# Patient Record
Sex: Female | Born: 1965 | Race: Black or African American | Hispanic: No | Smoking: Never smoker
Health system: Southern US, Community
[De-identification: ages and names within clinical notes are randomized; demographics above are authoritative.]

## PROBLEM LIST (undated history)

## (undated) DIAGNOSIS — M549 Dorsalgia, unspecified: Secondary | ICD-10-CM

## (undated) DIAGNOSIS — G4733 Obstructive sleep apnea (adult) (pediatric): Secondary | ICD-10-CM

## (undated) DIAGNOSIS — M069 Rheumatoid arthritis, unspecified: Secondary | ICD-10-CM

## (undated) DIAGNOSIS — E119 Type 2 diabetes mellitus without complications: Secondary | ICD-10-CM

## (undated) DIAGNOSIS — R011 Cardiac murmur, unspecified: Secondary | ICD-10-CM

## (undated) DIAGNOSIS — M810 Age-related osteoporosis without current pathological fracture: Secondary | ICD-10-CM

## (undated) DIAGNOSIS — F333 Major depressive disorder, recurrent, severe with psychotic symptoms: Secondary | ICD-10-CM

## (undated) DIAGNOSIS — R002 Palpitations: Secondary | ICD-10-CM

## (undated) DIAGNOSIS — R51 Headache: Secondary | ICD-10-CM

## (undated) DIAGNOSIS — G8929 Other chronic pain: Secondary | ICD-10-CM

## (undated) DIAGNOSIS — E559 Vitamin D deficiency, unspecified: Secondary | ICD-10-CM

## (undated) DIAGNOSIS — I10 Essential (primary) hypertension: Secondary | ICD-10-CM

## (undated) DIAGNOSIS — J3081 Allergic rhinitis due to animal (cat) (dog) hair and dander: Secondary | ICD-10-CM

## (undated) DIAGNOSIS — Z86718 Personal history of other venous thrombosis and embolism: Secondary | ICD-10-CM

## (undated) DIAGNOSIS — I2699 Other pulmonary embolism without acute cor pulmonale: Secondary | ICD-10-CM

## (undated) DIAGNOSIS — M2550 Pain in unspecified joint: Secondary | ICD-10-CM

## (undated) DIAGNOSIS — D649 Anemia, unspecified: Secondary | ICD-10-CM

## (undated) DIAGNOSIS — K5900 Constipation, unspecified: Secondary | ICD-10-CM

## (undated) DIAGNOSIS — Z91018 Allergy to other foods: Secondary | ICD-10-CM

## (undated) DIAGNOSIS — Z9181 History of falling: Secondary | ICD-10-CM

## (undated) DIAGNOSIS — T7840XA Allergy, unspecified, initial encounter: Secondary | ICD-10-CM

## (undated) DIAGNOSIS — R0600 Dyspnea, unspecified: Secondary | ICD-10-CM

## (undated) DIAGNOSIS — F410 Panic disorder [episodic paroxysmal anxiety] without agoraphobia: Secondary | ICD-10-CM

## (undated) DIAGNOSIS — R55 Syncope and collapse: Secondary | ICD-10-CM

## (undated) DIAGNOSIS — F32A Depression, unspecified: Secondary | ICD-10-CM

## (undated) DIAGNOSIS — Z9109 Other allergy status, other than to drugs and biological substances: Secondary | ICD-10-CM

## (undated) DIAGNOSIS — R42 Dizziness and giddiness: Secondary | ICD-10-CM

## (undated) DIAGNOSIS — M542 Cervicalgia: Secondary | ICD-10-CM

## (undated) DIAGNOSIS — E7800 Pure hypercholesterolemia, unspecified: Secondary | ICD-10-CM

## (undated) DIAGNOSIS — F319 Bipolar disorder, unspecified: Secondary | ICD-10-CM

## (undated) DIAGNOSIS — R5382 Chronic fatigue, unspecified: Secondary | ICD-10-CM

## (undated) DIAGNOSIS — R600 Localized edema: Secondary | ICD-10-CM

## (undated) DIAGNOSIS — G4730 Sleep apnea, unspecified: Secondary | ICD-10-CM

## (undated) DIAGNOSIS — M797 Fibromyalgia: Secondary | ICD-10-CM

## (undated) DIAGNOSIS — E739 Lactose intolerance, unspecified: Secondary | ICD-10-CM

## (undated) DIAGNOSIS — E079 Disorder of thyroid, unspecified: Secondary | ICD-10-CM

## (undated) DIAGNOSIS — J309 Allergic rhinitis, unspecified: Secondary | ICD-10-CM

## (undated) DIAGNOSIS — K219 Gastro-esophageal reflux disease without esophagitis: Secondary | ICD-10-CM

## (undated) DIAGNOSIS — F419 Anxiety disorder, unspecified: Secondary | ICD-10-CM

## (undated) DIAGNOSIS — G9332 Myalgic encephalomyelitis/chronic fatigue syndrome: Secondary | ICD-10-CM

## (undated) DIAGNOSIS — M5430 Sciatica, unspecified side: Secondary | ICD-10-CM

## (undated) DIAGNOSIS — E0500 Thyrotoxicosis with diffuse goiter without thyrotoxic crisis or storm: Secondary | ICD-10-CM

## (undated) DIAGNOSIS — F329 Major depressive disorder, single episode, unspecified: Secondary | ICD-10-CM

## (undated) HISTORY — DX: Disorder of thyroid, unspecified: E07.9

## (undated) HISTORY — DX: Cervicalgia: M54.2

## (undated) HISTORY — DX: Allergy, unspecified, initial encounter: T78.40XA

## (undated) HISTORY — DX: Obstructive sleep apnea (adult) (pediatric): G47.33

## (undated) HISTORY — DX: Other chronic pain: G89.29

## (undated) HISTORY — DX: Cardiac murmur, unspecified: R01.1

## (undated) HISTORY — DX: Myalgic encephalomyelitis/chronic fatigue syndrome: G93.32

## (undated) HISTORY — DX: Panic disorder (episodic paroxysmal anxiety): F41.0

## (undated) HISTORY — DX: Allergic rhinitis, unspecified: J30.9

## (undated) HISTORY — PX: COLONOSCOPY: SHX174

## (undated) HISTORY — DX: Rheumatoid arthritis, unspecified: M06.9

## (undated) HISTORY — DX: Age-related osteoporosis without current pathological fracture: M81.0

## (undated) HISTORY — DX: Sleep apnea, unspecified: G47.30

## (undated) HISTORY — DX: Constipation, unspecified: K59.00

## (undated) HISTORY — DX: Palpitations: R00.2

## (undated) HISTORY — DX: Pain in unspecified joint: M25.50

## (undated) HISTORY — DX: Localized edema: R60.0

## (undated) HISTORY — DX: Anxiety disorder, unspecified: F41.9

## (undated) HISTORY — DX: Vitamin D deficiency, unspecified: E55.9

## (undated) HISTORY — DX: Allergic rhinitis due to animal (cat) (dog) hair and dander: J30.81

## (undated) HISTORY — DX: Chronic fatigue, unspecified: R53.82

## (undated) HISTORY — DX: Anemia, unspecified: D64.9

## (undated) HISTORY — DX: Lactose intolerance, unspecified: E73.9

## (undated) HISTORY — DX: Allergy to other foods: Z91.018

## (undated) HISTORY — DX: History of falling: Z91.81

## (undated) HISTORY — DX: Major depressive disorder, recurrent, severe with psychotic symptoms: F33.3

## (undated) HISTORY — DX: Dyspnea, unspecified: R06.00

## (undated) HISTORY — DX: Type 2 diabetes mellitus without complications: E11.9

## (undated) HISTORY — DX: Headache: R51

## (undated) HISTORY — DX: Pure hypercholesterolemia, unspecified: E78.00

## (undated) HISTORY — DX: Dorsalgia, unspecified: M54.9

## (undated) HISTORY — DX: Essential (primary) hypertension: I10

## (undated) HISTORY — DX: Dizziness and giddiness: R42

## (undated) HISTORY — DX: Bipolar disorder, unspecified: F31.9

## (undated) HISTORY — DX: Personal history of other venous thrombosis and embolism: Z86.718

---

## 1997-11-11 ENCOUNTER — Emergency Department (HOSPITAL_COMMUNITY): Admission: EM | Admit: 1997-11-11 | Discharge: 1997-11-11 | Payer: Self-pay

## 1998-10-08 ENCOUNTER — Ambulatory Visit (HOSPITAL_COMMUNITY): Admission: RE | Admit: 1998-10-08 | Discharge: 1998-10-08 | Payer: Self-pay | Admitting: Internal Medicine

## 2002-09-27 ENCOUNTER — Other Ambulatory Visit: Admission: RE | Admit: 2002-09-27 | Discharge: 2002-09-27 | Payer: Self-pay | Admitting: Gynecology

## 2003-01-21 ENCOUNTER — Encounter: Admission: RE | Admit: 2003-01-21 | Discharge: 2003-01-21 | Payer: Self-pay | Admitting: Allergy and Immunology

## 2003-04-11 ENCOUNTER — Encounter: Admission: RE | Admit: 2003-04-11 | Discharge: 2003-04-11 | Payer: Self-pay | Admitting: Allergy and Immunology

## 2003-04-25 ENCOUNTER — Ambulatory Visit (HOSPITAL_COMMUNITY): Admission: RE | Admit: 2003-04-25 | Discharge: 2003-04-25 | Payer: Self-pay | Admitting: Allergy and Immunology

## 2003-05-28 ENCOUNTER — Ambulatory Visit (HOSPITAL_COMMUNITY): Admission: RE | Admit: 2003-05-28 | Discharge: 2003-05-28 | Payer: Self-pay | Admitting: Gastroenterology

## 2003-06-17 ENCOUNTER — Encounter: Admission: RE | Admit: 2003-06-17 | Discharge: 2003-06-17 | Payer: Self-pay | Admitting: Allergy and Immunology

## 2003-09-02 ENCOUNTER — Emergency Department (HOSPITAL_COMMUNITY): Admission: EM | Admit: 2003-09-02 | Discharge: 2003-09-03 | Payer: Self-pay | Admitting: Emergency Medicine

## 2004-02-11 ENCOUNTER — Other Ambulatory Visit: Admission: RE | Admit: 2004-02-11 | Discharge: 2004-02-11 | Payer: Self-pay | Admitting: Gynecology

## 2004-04-20 ENCOUNTER — Encounter (INDEPENDENT_AMBULATORY_CARE_PROVIDER_SITE_OTHER): Payer: Self-pay | Admitting: Specialist

## 2004-04-20 ENCOUNTER — Observation Stay (HOSPITAL_COMMUNITY): Admission: RE | Admit: 2004-04-20 | Discharge: 2004-04-21 | Payer: Self-pay | Admitting: Gynecology

## 2004-05-24 HISTORY — PX: ABDOMINAL HYSTERECTOMY: SHX81

## 2005-07-15 ENCOUNTER — Other Ambulatory Visit: Admission: RE | Admit: 2005-07-15 | Discharge: 2005-07-15 | Payer: Self-pay | Admitting: Gynecology

## 2006-03-24 ENCOUNTER — Ambulatory Visit: Payer: Self-pay | Admitting: Internal Medicine

## 2006-05-24 HISTORY — PX: GASTRIC BYPASS: SHX52

## 2006-07-05 ENCOUNTER — Ambulatory Visit: Payer: Self-pay | Admitting: Internal Medicine

## 2006-07-18 ENCOUNTER — Other Ambulatory Visit: Admission: RE | Admit: 2006-07-18 | Discharge: 2006-07-18 | Payer: Self-pay | Admitting: Gynecology

## 2006-07-22 ENCOUNTER — Ambulatory Visit (HOSPITAL_COMMUNITY): Admission: RE | Admit: 2006-07-22 | Discharge: 2006-07-22 | Payer: Self-pay | Admitting: Gynecology

## 2006-08-17 ENCOUNTER — Ambulatory Visit (HOSPITAL_BASED_OUTPATIENT_CLINIC_OR_DEPARTMENT_OTHER): Admission: RE | Admit: 2006-08-17 | Discharge: 2006-08-17 | Payer: Self-pay | Admitting: Internal Medicine

## 2006-08-28 ENCOUNTER — Ambulatory Visit: Payer: Self-pay | Admitting: Internal Medicine

## 2006-09-22 ENCOUNTER — Ambulatory Visit: Payer: Self-pay | Admitting: Internal Medicine

## 2006-11-28 ENCOUNTER — Ambulatory Visit (HOSPITAL_COMMUNITY): Admission: RE | Admit: 2006-11-28 | Discharge: 2006-11-28 | Payer: Self-pay | Admitting: Family Medicine

## 2007-01-06 ENCOUNTER — Ambulatory Visit: Payer: Self-pay | Admitting: Internal Medicine

## 2007-02-08 ENCOUNTER — Ambulatory Visit: Payer: Self-pay | Admitting: Internal Medicine

## 2007-06-15 ENCOUNTER — Encounter: Payer: Self-pay | Admitting: Internal Medicine

## 2007-06-29 ENCOUNTER — Ambulatory Visit (HOSPITAL_COMMUNITY): Admission: RE | Admit: 2007-06-29 | Discharge: 2007-06-29 | Payer: Self-pay | Admitting: General Surgery

## 2007-07-13 ENCOUNTER — Encounter: Admission: RE | Admit: 2007-07-13 | Discharge: 2007-07-13 | Payer: Self-pay | Admitting: General Surgery

## 2007-07-25 ENCOUNTER — Ambulatory Visit (HOSPITAL_COMMUNITY): Admission: RE | Admit: 2007-07-25 | Discharge: 2007-07-25 | Payer: Self-pay | Admitting: General Surgery

## 2007-07-26 ENCOUNTER — Other Ambulatory Visit: Admission: RE | Admit: 2007-07-26 | Discharge: 2007-07-26 | Payer: Self-pay | Admitting: Obstetrics and Gynecology

## 2007-12-14 ENCOUNTER — Encounter: Admission: RE | Admit: 2007-12-14 | Discharge: 2008-02-13 | Payer: Self-pay | Admitting: General Surgery

## 2007-12-25 ENCOUNTER — Inpatient Hospital Stay (HOSPITAL_COMMUNITY): Admission: RE | Admit: 2007-12-25 | Discharge: 2007-12-27 | Payer: Self-pay | Admitting: General Surgery

## 2007-12-26 ENCOUNTER — Encounter (INDEPENDENT_AMBULATORY_CARE_PROVIDER_SITE_OTHER): Payer: Self-pay | Admitting: General Surgery

## 2007-12-26 ENCOUNTER — Ambulatory Visit: Payer: Self-pay | Admitting: Vascular Surgery

## 2008-01-25 ENCOUNTER — Encounter: Admission: RE | Admit: 2008-01-25 | Discharge: 2008-01-25 | Payer: Self-pay | Admitting: General Surgery

## 2008-07-26 ENCOUNTER — Other Ambulatory Visit: Admission: RE | Admit: 2008-07-26 | Discharge: 2008-07-26 | Payer: Self-pay | Admitting: Gynecology

## 2008-07-26 ENCOUNTER — Ambulatory Visit (HOSPITAL_COMMUNITY): Admission: RE | Admit: 2008-07-26 | Discharge: 2008-07-26 | Payer: Self-pay | Admitting: Gynecology

## 2008-07-26 ENCOUNTER — Encounter: Payer: Self-pay | Admitting: Women's Health

## 2008-07-26 ENCOUNTER — Ambulatory Visit: Payer: Self-pay | Admitting: Women's Health

## 2008-08-02 ENCOUNTER — Ambulatory Visit: Payer: Self-pay | Admitting: Internal Medicine

## 2008-08-02 ENCOUNTER — Emergency Department (HOSPITAL_COMMUNITY): Admission: EM | Admit: 2008-08-02 | Discharge: 2008-08-02 | Payer: Self-pay | Admitting: Emergency Medicine

## 2008-08-12 ENCOUNTER — Telehealth (INDEPENDENT_AMBULATORY_CARE_PROVIDER_SITE_OTHER): Payer: Self-pay | Admitting: *Deleted

## 2008-08-13 ENCOUNTER — Ambulatory Visit: Payer: Self-pay

## 2008-08-13 ENCOUNTER — Encounter: Payer: Self-pay | Admitting: Internal Medicine

## 2008-08-13 ENCOUNTER — Encounter: Payer: Self-pay | Admitting: Cardiovascular Disease

## 2008-12-25 ENCOUNTER — Encounter: Admission: RE | Admit: 2008-12-25 | Discharge: 2009-03-25 | Payer: Self-pay | Admitting: General Surgery

## 2009-04-19 ENCOUNTER — Emergency Department (HOSPITAL_COMMUNITY): Admission: EM | Admit: 2009-04-19 | Discharge: 2009-04-19 | Payer: Self-pay | Admitting: Emergency Medicine

## 2009-05-05 ENCOUNTER — Inpatient Hospital Stay (HOSPITAL_COMMUNITY): Admission: EM | Admit: 2009-05-05 | Discharge: 2009-05-07 | Payer: Self-pay | Admitting: Emergency Medicine

## 2009-05-09 ENCOUNTER — Inpatient Hospital Stay (HOSPITAL_COMMUNITY): Admission: RE | Admit: 2009-05-09 | Discharge: 2009-05-12 | Payer: Self-pay | Admitting: Psychiatry

## 2009-05-09 ENCOUNTER — Ambulatory Visit: Payer: Self-pay | Admitting: Psychiatry

## 2009-08-06 ENCOUNTER — Emergency Department (HOSPITAL_COMMUNITY): Admission: EM | Admit: 2009-08-06 | Discharge: 2009-08-06 | Payer: Self-pay | Admitting: Emergency Medicine

## 2009-08-06 ENCOUNTER — Ambulatory Visit: Payer: Self-pay | Admitting: Women's Health

## 2009-08-06 ENCOUNTER — Other Ambulatory Visit: Admission: RE | Admit: 2009-08-06 | Discharge: 2009-08-06 | Payer: Self-pay | Admitting: Obstetrics and Gynecology

## 2009-08-14 ENCOUNTER — Ambulatory Visit (HOSPITAL_COMMUNITY): Admission: RE | Admit: 2009-08-14 | Discharge: 2009-08-14 | Payer: Self-pay | Admitting: Gynecology

## 2010-03-12 ENCOUNTER — Encounter: Admission: RE | Admit: 2010-03-12 | Discharge: 2010-03-12 | Payer: Self-pay | Admitting: Family Medicine

## 2010-06-14 ENCOUNTER — Encounter: Payer: Self-pay | Admitting: General Surgery

## 2010-07-13 ENCOUNTER — Ambulatory Visit: Payer: Self-pay | Admitting: Psychiatry

## 2010-08-16 LAB — BASIC METABOLIC PANEL
BUN: 16 mg/dL (ref 6–23)
CO2: 25 mEq/L (ref 19–32)
Calcium: 9.1 mg/dL (ref 8.4–10.5)
Chloride: 102 mEq/L (ref 96–112)
Creatinine, Ser: 0.7 mg/dL (ref 0.4–1.2)
GFR calc Af Amer: >60 mL/min (ref >60)
GFR calc non Af Amer: >60 mL/min (ref >60)
Glucose, Bld: 96 mg/dL (ref 70–99)
Potassium: 5.2 mEq/L — ABNORMAL HIGH (ref 3.5–5.1)
Sodium: 135 mEq/L (ref 135–145)

## 2010-08-16 LAB — DIFFERENTIAL
Basophils Absolute: 0 10*3/uL (ref 0.0–0.1)
Basophils Relative: 0 % (ref 0–1)
Eosinophils Absolute: 0.2 10*3/uL (ref 0.0–0.7)
Eosinophils Relative: 2 % (ref 0–5)
Lymphocytes Relative: 28 % (ref 12–46)
Lymphs Abs: 2.4 10*3/uL (ref 0.7–4.0)
Monocytes Absolute: 0.8 10*3/uL (ref 0.1–1.0)
Monocytes Relative: 9 % (ref 3–12)
Neutro Abs: 5.4 10*3/uL (ref 1.7–7.7)
Neutrophils Relative %: 61 % (ref 43–77)

## 2010-08-16 LAB — CBC
HCT: 42.8 % (ref 36.0–46.0)
Hemoglobin: 13.9 g/dL (ref 12.0–15.0)
MCHC: 32.5 g/dL (ref 30.0–36.0)
MCV: 88.6 fL (ref 78.0–100.0)
Platelets: 246 10*3/uL (ref 150–400)
RBC: 4.83 MIL/uL (ref 3.87–5.11)
RDW: 13.4 % (ref 11.5–15.5)
WBC: 8.8 10*3/uL (ref 4.0–10.5)

## 2010-08-24 LAB — PROPOXYPHENE, CONFIRMATION
Propoxyphene Metabolite: 13000 ng/mL
Propoxyphene or Metab. GC/MS: NEGATIVE

## 2010-08-24 LAB — CBC
HCT: 38.7 % (ref 36.0–46.0)
Hemoglobin: 12.4 g/dL (ref 12.0–15.0)
MCHC: 32.1 g/dL (ref 30.0–36.0)
MCV: 90.4 fL (ref 78.0–100.0)
Platelets: 145 10*3/uL — ABNORMAL LOW (ref 150–400)
RBC: 4.28 MIL/uL (ref 3.87–5.11)
RDW: 14.1 % (ref 11.5–15.5)
WBC: 7.4 10*3/uL (ref 4.0–10.5)

## 2010-08-24 LAB — COMPREHENSIVE METABOLIC PANEL
ALT: 37 U/L — ABNORMAL HIGH (ref 0–35)
AST: 37 U/L (ref 0–37)
Albumin: 3.8 g/dL (ref 3.5–5.2)
Alkaline Phosphatase: 45 U/L (ref 39–117)
BUN: 10 mg/dL (ref 6–23)
CO2: 28 mEq/L (ref 19–32)
Calcium: 8.8 mg/dL (ref 8.4–10.5)
Chloride: 103 mEq/L (ref 96–112)
Creatinine, Ser: 0.84 mg/dL (ref 0.4–1.2)
GFR calc Af Amer: >60 mL/min (ref >60)
GFR calc non Af Amer: >60 mL/min (ref >60)
Glucose, Bld: 86 mg/dL (ref 70–99)
Potassium: 3.3 mEq/L — ABNORMAL LOW (ref 3.5–5.1)
Sodium: 141 mEq/L (ref 135–145)
Total Bilirubin: 0.3 mg/dL (ref 0.3–1.2)
Total Protein: 7 g/dL (ref 6.0–8.3)

## 2010-08-24 LAB — DRUGS OF ABUSE SCREEN W/O ALC, ROUTINE URINE
Amphetamine Screen, Ur: NEGATIVE
Barbiturate Quant, Ur: NEGATIVE
Benzodiazepines.: NEGATIVE
Cocaine Metabolites: NEGATIVE
Creatinine,U: 253 mg/dL
Marijuana Metabolite: NEGATIVE
Methadone: NEGATIVE
Opiate Screen, Urine: NEGATIVE
Phencyclidine (PCP): NEGATIVE
Propoxyphene: POSITIVE — AB

## 2010-08-24 LAB — VALPROIC ACID LEVEL: Valproic Acid Lvl: 88 ug/mL (ref 50.0–100.0)

## 2010-08-24 LAB — PREGNANCY, URINE: Preg Test, Ur: NEGATIVE

## 2010-08-25 LAB — VALPROIC ACID LEVEL
Valproic Acid Lvl: 141.3 ug/mL — ABNORMAL HIGH (ref 50.0–100.0)
Valproic Acid Lvl: 62.2 ug/mL (ref 50.0–100.0)

## 2010-08-25 LAB — COMPREHENSIVE METABOLIC PANEL
ALT: 11 U/L (ref 0–35)
ALT: 12 U/L (ref 0–35)
ALT: 12 U/L (ref 0–35)
AST: 10 U/L (ref 0–37)
AST: 15 U/L (ref 0–37)
AST: 18 U/L (ref 0–37)
Albumin: 2.6 g/dL — ABNORMAL LOW (ref 3.5–5.2)
Albumin: 2.7 g/dL — ABNORMAL LOW (ref 3.5–5.2)
Albumin: 3.6 g/dL (ref 3.5–5.2)
Alkaline Phosphatase: 33 U/L — ABNORMAL LOW (ref 39–117)
Alkaline Phosphatase: 33 U/L — ABNORMAL LOW (ref 39–117)
Alkaline Phosphatase: 46 U/L (ref 39–117)
BUN: 12 mg/dL (ref 6–23)
BUN: 3 mg/dL — ABNORMAL LOW (ref 6–23)
BUN: 9 mg/dL (ref 6–23)
CO2: 25 mEq/L (ref 19–32)
CO2: 27 mEq/L (ref 19–32)
CO2: 27 mEq/L (ref 19–32)
Calcium: 8.3 mg/dL — ABNORMAL LOW (ref 8.4–10.5)
Calcium: 8.3 mg/dL — ABNORMAL LOW (ref 8.4–10.5)
Calcium: 8.7 mg/dL (ref 8.4–10.5)
Chloride: 108 mEq/L (ref 96–112)
Chloride: 110 mEq/L (ref 96–112)
Chloride: 113 mEq/L — ABNORMAL HIGH (ref 96–112)
Creatinine, Ser: 0.73 mg/dL (ref 0.4–1.2)
Creatinine, Ser: 0.77 mg/dL (ref 0.4–1.2)
Creatinine, Ser: 0.77 mg/dL (ref 0.4–1.2)
GFR calc Af Amer: >60 mL/min (ref >60)
GFR calc Af Amer: >60 mL/min (ref >60)
GFR calc Af Amer: >60 mL/min (ref >60)
GFR calc non Af Amer: >60 mL/min (ref >60)
GFR calc non Af Amer: >60 mL/min (ref >60)
GFR calc non Af Amer: >60 mL/min (ref >60)
Glucose, Bld: 122 mg/dL — ABNORMAL HIGH (ref 70–99)
Glucose, Bld: 69 mg/dL — ABNORMAL LOW (ref 70–99)
Glucose, Bld: 82 mg/dL (ref 70–99)
Potassium: 2.6 mEq/L — CL (ref 3.5–5.1)
Potassium: 3.6 mEq/L (ref 3.5–5.1)
Potassium: 3.7 mEq/L (ref 3.5–5.1)
Sodium: 142 mEq/L (ref 135–145)
Sodium: 143 mEq/L (ref 135–145)
Sodium: 143 mEq/L (ref 135–145)
Total Bilirubin: 0.3 mg/dL (ref 0.3–1.2)
Total Bilirubin: 0.5 mg/dL (ref 0.3–1.2)
Total Bilirubin: <0.1 mg/dL — ABNORMAL LOW (ref 0.3–1.2)
Total Protein: 5.1 g/dL — ABNORMAL LOW (ref 6.0–8.3)
Total Protein: 5.6 g/dL — ABNORMAL LOW (ref 6.0–8.3)
Total Protein: 6.9 g/dL (ref 6.0–8.3)

## 2010-08-25 LAB — DIFFERENTIAL
Basophils Absolute: 0.1 10*3/uL (ref 0.0–0.1)
Basophils Relative: 2 % — ABNORMAL HIGH (ref 0–1)
Eosinophils Absolute: 0.1 10*3/uL (ref 0.0–0.7)
Eosinophils Relative: 1 % (ref 0–5)
Lymphocytes Relative: 19 % (ref 12–46)
Lymphs Abs: 1.3 10*3/uL (ref 0.7–4.0)
Monocytes Absolute: 0.5 10*3/uL (ref 0.1–1.0)
Monocytes Relative: 8 % (ref 3–12)
Neutro Abs: 4.7 10*3/uL (ref 1.7–7.7)
Neutrophils Relative %: 70 % (ref 43–77)

## 2010-08-25 LAB — ACETAMINOPHEN LEVEL
Acetaminophen (Tylenol), Serum: 237.7 ug/mL (ref 10–30)
Acetaminophen (Tylenol), Serum: <10 ug/mL — ABNORMAL LOW (ref 10–30)

## 2010-08-25 LAB — RAPID URINE DRUG SCREEN, HOSP PERFORMED
Amphetamines: NOT DETECTED
Barbiturates: NOT DETECTED
Benzodiazepines: POSITIVE — AB
Cocaine: NOT DETECTED
Opiates: NOT DETECTED
Tetrahydrocannabinol: NOT DETECTED

## 2010-08-25 LAB — URINALYSIS, ROUTINE W REFLEX MICROSCOPIC
Bilirubin Urine: NEGATIVE
Glucose, UA: NEGATIVE mg/dL
Hgb urine dipstick: NEGATIVE
Ketones, ur: 15 mg/dL — AB
Nitrite: NEGATIVE
Protein, ur: NEGATIVE mg/dL
Specific Gravity, Urine: 1.025 (ref 1.005–1.030)
Urobilinogen, UA: 0.2 mg/dL (ref 0.0–1.0)
pH: 6.5 (ref 5.0–8.0)

## 2010-08-25 LAB — BLOOD GAS, ARTERIAL
Acid-base deficit: 0.8 mmol/L (ref 0.0–2.0)
Bicarbonate: 23 mEq/L (ref 20.0–24.0)
FIO2: 0.21 %
O2 Saturation: 96.2 %
Patient temperature: 98.6
TCO2: 20.9 mmol/L (ref 0–100)
pCO2 arterial: 37.1 mmHg (ref 35.0–45.0)
pH, Arterial: 7.409 — ABNORMAL HIGH (ref 7.350–7.400)
pO2, Arterial: 77.5 mmHg — ABNORMAL LOW (ref 80.0–100.0)

## 2010-08-25 LAB — CBC
HCT: 35.7 % — ABNORMAL LOW (ref 36.0–46.0)
HCT: 39.6 % (ref 36.0–46.0)
Hemoglobin: 11.6 g/dL — ABNORMAL LOW (ref 12.0–15.0)
Hemoglobin: 13.1 g/dL (ref 12.0–15.0)
MCHC: 32.5 g/dL (ref 30.0–36.0)
MCHC: 33 g/dL (ref 30.0–36.0)
MCV: 89.8 fL (ref 78.0–100.0)
MCV: 89.9 fL (ref 78.0–100.0)
Platelets: 112 10*3/uL — ABNORMAL LOW (ref 150–400)
Platelets: 129 10*3/uL — ABNORMAL LOW (ref 150–400)
RBC: 3.97 MIL/uL (ref 3.87–5.11)
RBC: 4.42 MIL/uL (ref 3.87–5.11)
RDW: 12.9 % (ref 11.5–15.5)
RDW: 13.4 % (ref 11.5–15.5)
WBC: 6.7 10*3/uL (ref 4.0–10.5)
WBC: 6.9 10*3/uL (ref 4.0–10.5)

## 2010-08-25 LAB — HEPATIC FUNCTION PANEL
ALT: 11 U/L (ref 0–35)
AST: 13 U/L (ref 0–37)
Albumin: 3.2 g/dL — ABNORMAL LOW (ref 3.5–5.2)
Alkaline Phosphatase: 40 U/L (ref 39–117)
Bilirubin, Direct: <0.1 mg/dL (ref 0.0–0.3)
Total Bilirubin: 0.3 mg/dL (ref 0.3–1.2)
Total Protein: 6.8 g/dL (ref 6.0–8.3)

## 2010-08-25 LAB — ETHANOL: Alcohol, Ethyl (B): <5 mg/dL (ref 0–10)

## 2010-08-25 LAB — APTT
aPTT: 23 seconds — ABNORMAL LOW (ref 24–37)
aPTT: 29 seconds (ref 24–37)

## 2010-08-25 LAB — TSH: TSH: 1.03 u[IU]/mL (ref 0.350–4.500)

## 2010-08-25 LAB — PROTIME-INR
INR: 1.07 (ref 0.00–1.49)
INR: 1.17 (ref 0.00–1.49)
INR: 1.28 (ref 0.00–1.49)
Prothrombin Time: 13.8 seconds (ref 11.6–15.2)
Prothrombin Time: 14.8 seconds (ref 11.6–15.2)
Prothrombin Time: 15.9 seconds — ABNORMAL HIGH (ref 11.6–15.2)

## 2010-08-25 LAB — POTASSIUM: Potassium: 3.7 mEq/L (ref 3.5–5.1)

## 2010-08-25 LAB — AMMONIA: Ammonia: 16 umol/L (ref 11–35)

## 2010-08-25 LAB — SALICYLATE LEVEL: Salicylate Lvl: <4 mg/dL (ref 2.8–20.0)

## 2010-08-26 LAB — DIFFERENTIAL
Basophils Absolute: 0.1 10*3/uL (ref 0.0–0.1)
Basophils Relative: 1 % (ref 0–1)
Eosinophils Absolute: 0.2 10*3/uL (ref 0.0–0.7)
Eosinophils Relative: 3 % (ref 0–5)
Lymphocytes Relative: 23 % (ref 12–46)
Lymphs Abs: 1.5 10*3/uL (ref 0.7–4.0)
Monocytes Absolute: 0.9 10*3/uL (ref 0.1–1.0)
Monocytes Relative: 15 % — ABNORMAL HIGH (ref 3–12)
Neutro Abs: 3.7 10*3/uL (ref 1.7–7.7)
Neutrophils Relative %: 58 % (ref 43–77)

## 2010-08-26 LAB — POCT CARDIAC MARKERS
CKMB, poc: <1 ng/mL — ABNORMAL LOW (ref 1.0–8.0)
CKMB, poc: <1 ng/mL — ABNORMAL LOW (ref 1.0–8.0)
Myoglobin, poc: 25.7 ng/mL (ref 12–200)
Myoglobin, poc: 39.1 ng/mL (ref 12–200)
Troponin i, poc: <0.05 ng/mL (ref 0.00–0.09)
Troponin i, poc: <0.05 ng/mL (ref 0.00–0.09)

## 2010-08-26 LAB — COMPREHENSIVE METABOLIC PANEL
ALT: 12 U/L (ref 0–35)
AST: 17 U/L (ref 0–37)
Albumin: 3.8 g/dL (ref 3.5–5.2)
Alkaline Phosphatase: 56 U/L (ref 39–117)
BUN: 10 mg/dL (ref 6–23)
CO2: 26 mEq/L (ref 19–32)
Calcium: 8.9 mg/dL (ref 8.4–10.5)
Chloride: 106 mEq/L (ref 96–112)
Creatinine, Ser: 0.68 mg/dL (ref 0.4–1.2)
GFR calc Af Amer: >60 mL/min (ref >60)
GFR calc non Af Amer: >60 mL/min (ref >60)
Glucose, Bld: 96 mg/dL (ref 70–99)
Potassium: 3.3 mEq/L — ABNORMAL LOW (ref 3.5–5.1)
Sodium: 140 mEq/L (ref 135–145)
Total Bilirubin: 0.4 mg/dL (ref 0.3–1.2)
Total Protein: 7.2 g/dL (ref 6.0–8.3)

## 2010-08-26 LAB — URINALYSIS, ROUTINE W REFLEX MICROSCOPIC
Bilirubin Urine: NEGATIVE
Glucose, UA: NEGATIVE mg/dL
Hgb urine dipstick: NEGATIVE
Nitrite: NEGATIVE
Protein, ur: NEGATIVE mg/dL
Specific Gravity, Urine: 1.017 (ref 1.005–1.030)
Urobilinogen, UA: 0.2 mg/dL (ref 0.0–1.0)
pH: 6.5 (ref 5.0–8.0)

## 2010-08-26 LAB — CBC
HCT: 39.3 % (ref 36.0–46.0)
Hemoglobin: 13.2 g/dL (ref 12.0–15.0)
MCHC: 33.5 g/dL (ref 30.0–36.0)
MCV: 88.9 fL (ref 78.0–100.0)
Platelets: 139 10*3/uL — ABNORMAL LOW (ref 150–400)
RBC: 4.43 MIL/uL (ref 3.87–5.11)
RDW: 12.6 % (ref 11.5–15.5)
WBC: 6.4 10*3/uL (ref 4.0–10.5)

## 2010-08-26 LAB — POCT PREGNANCY, URINE: Preg Test, Ur: NEGATIVE

## 2010-08-26 LAB — POCT I-STAT, CHEM 8
BUN: 15 mg/dL (ref 6–23)
Calcium, Ion: 1.05 mmol/L — ABNORMAL LOW (ref 1.12–1.32)
Chloride: 106 mEq/L (ref 96–112)
Creatinine, Ser: 0.6 mg/dL (ref 0.4–1.2)
Glucose, Bld: 67 mg/dL — ABNORMAL LOW (ref 70–99)
HCT: 41 % (ref 36.0–46.0)
Hemoglobin: 13.9 g/dL (ref 12.0–15.0)
Potassium: 6 mEq/L — ABNORMAL HIGH (ref 3.5–5.1)
Sodium: 140 mEq/L (ref 135–145)
TCO2: 29 mmol/L (ref 0–100)

## 2010-08-26 LAB — D-DIMER, QUANTITATIVE: D-Dimer, Quant: 0.29 ug/mL-FEU (ref 0.00–0.48)

## 2010-08-27 ENCOUNTER — Other Ambulatory Visit: Payer: Self-pay | Admitting: Women's Health

## 2010-09-03 LAB — URINALYSIS, ROUTINE W REFLEX MICROSCOPIC
Bilirubin Urine: NEGATIVE
Glucose, UA: NEGATIVE mg/dL
Hgb urine dipstick: NEGATIVE
Ketones, ur: NEGATIVE mg/dL
Nitrite: NEGATIVE
Protein, ur: NEGATIVE mg/dL
Specific Gravity, Urine: 1.014 (ref 1.005–1.030)
Urobilinogen, UA: 0.2 mg/dL (ref 0.0–1.0)
pH: 6.5 (ref 5.0–8.0)

## 2010-09-03 LAB — DIFFERENTIAL
Basophils Absolute: 0 10*3/uL (ref 0.0–0.1)
Basophils Relative: 0 % (ref 0–1)
Eosinophils Absolute: 0.2 10*3/uL (ref 0.0–0.7)
Eosinophils Relative: 2 % (ref 0–5)
Lymphocytes Relative: 29 % (ref 12–46)
Lymphs Abs: 2.4 10*3/uL (ref 0.7–4.0)
Monocytes Absolute: 1.1 10*3/uL — ABNORMAL HIGH (ref 0.1–1.0)
Monocytes Relative: 13 % — ABNORMAL HIGH (ref 3–12)
Neutro Abs: 4.4 10*3/uL (ref 1.7–7.7)
Neutrophils Relative %: 55 % (ref 43–77)

## 2010-09-03 LAB — PHOSPHORUS: Phosphorus: 3.8 mg/dL (ref 2.3–4.6)

## 2010-09-03 LAB — POCT I-STAT, CHEM 8
BUN: 15 mg/dL (ref 6–23)
Calcium, Ion: 1.09 mmol/L — ABNORMAL LOW (ref 1.12–1.32)
Chloride: 105 mEq/L (ref 96–112)
Creatinine, Ser: 0.8 mg/dL (ref 0.4–1.2)
Glucose, Bld: 81 mg/dL (ref 70–99)
HCT: 43 % (ref 36.0–46.0)
Hemoglobin: 14.6 g/dL (ref 12.0–15.0)
Potassium: 3.7 mEq/L (ref 3.5–5.1)
Sodium: 139 mEq/L (ref 135–145)
TCO2: 26 mmol/L (ref 0–100)

## 2010-09-03 LAB — CBC
HCT: 40.8 % (ref 36.0–46.0)
Hemoglobin: 13.4 g/dL (ref 12.0–15.0)
MCHC: 32.8 g/dL (ref 30.0–36.0)
MCV: 86.9 fL (ref 78.0–100.0)
Platelets: 213 10*3/uL (ref 150–400)
RBC: 4.69 MIL/uL (ref 3.87–5.11)
RDW: 13.9 % (ref 11.5–15.5)
WBC: 8.1 10*3/uL (ref 4.0–10.5)

## 2010-09-03 LAB — URINE CULTURE: Colony Count: 65000

## 2010-09-03 LAB — GLUCOSE, CAPILLARY: Glucose-Capillary: 87 mg/dL (ref 70–99)

## 2010-09-03 LAB — POCT CARDIAC MARKERS
CKMB, poc: <1 ng/mL — ABNORMAL LOW (ref 1.0–8.0)
Myoglobin, poc: 24.3 ng/mL (ref 12–200)
Troponin i, poc: <0.05 ng/mL (ref 0.00–0.09)

## 2010-09-03 LAB — MAGNESIUM: Magnesium: 2.2 mg/dL (ref 1.5–2.5)

## 2010-09-03 LAB — RAPID URINE DRUG SCREEN, HOSP PERFORMED
Amphetamines: NOT DETECTED
Barbiturates: NOT DETECTED
Benzodiazepines: POSITIVE — AB
Cocaine: NOT DETECTED
Opiates: NOT DETECTED
Tetrahydrocannabinol: NOT DETECTED

## 2010-10-02 ENCOUNTER — Other Ambulatory Visit: Payer: Self-pay | Admitting: Gynecology

## 2010-10-02 DIAGNOSIS — Z1231 Encounter for screening mammogram for malignant neoplasm of breast: Secondary | ICD-10-CM

## 2010-10-06 NOTE — Consult Note (Signed)
NAME:  [name], [name]               ACCOUNT NO.:  [id number]   MEDICAL RECORD NO.:  [id number]          PATIENT TYPE:  EMS   LOCATION:  ED                           FACILITY:  [facility]   PHYSICIAN:  Bevelyn Buckles. Bensimhon, MDDATE OF BIRTH:  1965-09-22   DATE OF CONSULTATION:  08/02/2008  DATE OF DISCHARGE:                                 CONSULTATION   CARDIOLOGY EMERGENCY ROOM CONSULTATION   PRIMARY CARDIOLOGIST/PRIME CARE PHYSICIAN/ALLERGIST:  Tamela Gammon,  MD.  Cardiologist will be new, Bevelyn Buckles. Bensimhon, MD.   HISTORY OF PRESENT ILLNESS:  This 45 year old African American female  with no prior cardiac history that we have records of, who presented to  [facility] ER after having a syncopal episode.  The patient apparently  was sitting down next to someone who was eating some shellfish and began  to feel her throat closing up, felt palpitations, trouble breathing, and  passed out, was seen here as a result of that, and was given IV Solu-  Medrol, Benadryl, and Pepcid.  The patient admits to 2 weeks ago having  another syncopal episode after climbing stairs and becoming real short  of breath and wheezy and had a period of syncope.  The patient did have  an EKG completed in [facility] ER that did show some T-wave flattening  inferolaterally and questionable QT interval prolongation.  The patient  denied any chest pain, but she did complain of some palpitations prior  to admission.   REVIEW OF SYSTEMS:  Positive for shortness of breath, palpitations, and  syncope.  All other systems are reviewed and negative.   PAST MEDICAL HISTORY:  1. Morbid obesity.      a.     Status post gastric bypass, August of 2009.  2. Obstructive sleep apnea.  3. GERD.  4. A nutcracker esophagus.  5. Asthma.  6. Hypothyroid on no meds.  7. Heart murmur.   PAST SURGICAL HISTORY:  1. Status post gastric bypass, August of 2009.  2. Partial hysterectomy in 2005.   SOCIAL HISTORY:  Lives in  [place] with her children.  She is a  collections rep.  She is divorced.  Denies use of alcohol or tobacco or  drug use.   FAMILY HISTORY:  Mother with hyperthyroidism, hypertension, liver  disease, and alcoholism.  Father with hypertension and chronic back  pain.  She has 2 sisters with hypothyroidism.   CURRENT MEDICATIONS AT HOME:  Zyrtec, Singulair, Lunesta, and Astelin.   ALLERGIES:  1. VICODIN.  2. HYDROCODONE.  3. SHELLFISH.  4. STRAWBERRIES.  5. WALNUTS.   CURRENT LABORATORY DATA:  Sodium 139, potassium 3.7, chloride 105, CO2  26, BUN 15, creatinine 0.8, glucose 81.  Hemoglobin 13.4, hematocrit  40.8, white blood cells 8.1, platelets 213.  Troponin less than 0.05.  Magnesium 2.2.  Phosphorus 3.8.  EKG revealing normal sinus rhythm,  ventricular rate of 83 beats per minute with T-wave flattening noted  inferolaterally, PR interval 0.18, QRS 0.82, and QTC 0.481. Chest x-ray  revealing no active disease.   PHYSICAL EXAMINATION:  VITAL SIGNS:  Blood pressure 116/62,  pulse 89,  respirations 14, temperature 97.7, O2 SAT 96% on room air.  HEENT:  Head is normocephalic and atraumatic.  Eyes:  PERRLA.  Mucous  membranes in the mouth pink and moist.  Tongue is midline.  NECK:  Supple without JVD or carotid bruits appreciated.  CARDIOVASCULAR:  Regular rate and rhythm with 2/6 systolic murmur  auscultated.  Pulses are 2+ and equal without bruits.  LUNGS:  Clear to auscultation without wheezes, rales, or rhonchi.  ABDOMEN:  Soft and nontender, 2+ bowel sounds.  EXTREMITIES:  Without clubbing, cyanosis, or edema.  NEURO:  Cranial nerves II-XII are grossly intact.   IMPRESSION:  1. Syncopal episode.  2. Probable allergic reaction.  3. History of hypothyroidism.   PLAN:  This is a 45 year old [place] American female, who is admitted  with an episode of syncope.  She appears stable from a cardiovascular  standpoint and could probably go home and to follow up with an echo and   stress Myoview as an outpatient.  Would continue with allergist.  EKG  with T-wave flattening noted, and she does have a systolic murmur to be  evaluated with echo.  The patient shows no obvious cardiac issues.  The  patient will be discharged with outpatient stress Myoview, a 30-day  event monitor, and echocardiogram with followup with Dr. Gala Romney.  Would recommend checking TSH to primary care.      Bettey Mare. Lyman Bishop, NP      Bevelyn Buckles. Bensimhon, MD  Electronically Signed    KML/MEDQ  D:  08/02/2008  T:  08/02/2008  Job:  [id number]   cc:   [facility]

## 2010-10-06 NOTE — Op Note (Signed)
NAME:  [name], [name]               ACCOUNT NO.:  [id number]   MEDICAL RECORD NO.:  [id number]          PATIENT TYPE:  INP   LOCATION:  0002                         FACILITY:  [facility]   PHYSICIAN:  Sandria Bales. Ezzard Standing, M.D.  DATE OF BIRTH:  1966-02-20   DATE OF PROCEDURE:  12/25/2007  DATE OF DISCHARGE:                               OPERATIVE REPORT   Date of surgery ??   PREOPERATIVE DIAGNOSIS:  Morbid obesity status post laparoscopic Roux-en-  Y gastric bypass.   POSTOPERATIVE DIAGNOSIS:  Morbid obesity status post laparoscopic Roux-  en-Y gastric bypass, patent anastomosis without leak at the  gastrojejunal anastomosis.   PROCEDURES:  Esophagogastroduodenoscopy.   SURGEON:  Sandria Bales. Ezzard Standing, M.D.   FIRST ASSISTANT:  None.   ANESTHESIA:  General endotracheal.   ESTIMATED BLOOD LOSS:  None.   INDICATIONS FOR PROCEDURE:  Ms. Dominski is a 45 year old female who is  undergoing a laparoscopic Roux-en-Y gastric bypass for morbid obesity by  Dr. Jaclynn Guarneri.  He has completed the gastrojejunostomy.  I am doing  the upper endoscopy for evaluation of the gastric pouch and the  anastomosis.   OPERATIVE NOTE:  The patient in a mildly head up position, I passed a  flexible Olympus endoscope down the esophagus without difficulty and  advanced this into the pouch.   Dr. Johna Sheriff clamped off the jejunum, so I was able to insufflate air  into the gastric pouch.  Dr. Johna Sheriff flooded the upper abdomen with  saline and there was no evidence of any bubbling or leak.  I visualized  the anastomosis at about 44 cm and it is widely patent.  The mucosa of  the stomach was viable with no evidence of bleeding.  The  esophagogastric junction was at about 40 cm for 4 cm pouch.   The distal esophagus, though somewhat broad as far as size, there was no  mucosal lesion or mass.  This was a normal intraoperative endoscopy.  The scope was withdrawn without difficulty.  Photos were taken.  The  patient  tolerated the procedure well.   Dr. Johna Sheriff will dictate the remainder of the laparoscopic Roux-en-Y  gastric bypass.      Sandria Bales. Ezzard Standing, M.D.  Electronically Signed     DHN/MEDQ  D:  12/25/2007  T:  12/25/2007  Job:  [id number]   cc:   Lorne Skeens. Hoxworth, M.D.  [address]  [place]  [place] [address]

## 2010-10-06 NOTE — Op Note (Signed)
NAMEMarland [name]  KYRA, LAFFEY               ACCOUNT NO.:  [id number]   MEDICAL RECORD NO.:  [id number]          PATIENT TYPE:  INP   LOCATION:  0002                         FACILITY:  [facility]   PHYSICIAN:  Sharlet Salina T. Hoxworth, M.D.DATE OF BIRTH:  1966-02-28   DATE OF PROCEDURE:  12/25/2007  DATE OF DISCHARGE:                               OPERATIVE REPORT   PREOPERATIVE DIAGNOSIS:  Morbid obesity.   POSTOPERATIVE DIAGNOSIS:  Morbid obesity.   SURGICAL PROCEDURE:  Laparoscopic Roux-Y gastric bypass.   SURGEON:  Lorne Skeens. Hoxworth, M.D.   ASSISTANT:  Sandria Bales. Ezzard Standing, M.D.   ANESTHESIA:  General.   BRIEF HISTORY:  [name] is a 45 year old female with a progressive  history of morbid obesity unresponsive to medical management.  She  presents with a BMI of 42 at 266 pounds with comorbidities of  obstructive sleep apnea, GERD, and musculoskeletal disease.  After  extensive preoperative discussion detailed elsewhere, we have elected to  proceed with laparoscopic Roux-Y gastric bypass for treatment of her  morbid obesity.   DESCRIPTION OF OPERATION:  The patient was brought to operating room and  placed in the supine position on the operating table and general  endotracheal anesthesia was induced.  She had received preoperative IV  antibiotics and subcutaneous Lovenox.  PAS were in place.  She had  undergone mechanical and antibiotic bowel prep.  The abdomen was widely  sterilely prepped and draped.  Correct patient and procedure were  verified.  Local anesthesia was used to infiltrate the trocar sites.  Access was obtained through a 1-cm left subcostal incision with an  Optiview trocar without difficulty and pneumoperitoneum established.  Under direct vision 12-mm trocars were placed in the right upper lateral  abdomen, in the right mid abdomen an 11-mm trocar just to the left of  the umbilicus for camera port, and then finally a 5-mm trocar in the  left flank.  The omentum and  mesocolon were elevated and ligament of  Treitz identified.  A 40-cm biliopancreatic limb was then measured and  the small intestine divided at this point in a single firing of the  white load 45-mm stapler.  The mesentery was further immobilized for a  short distance with the Harmonic scalpel.  A Penrose drain was sutured  to the end of the Roux limb for identification later.  Following this a  100-cm Roux limb was measured.  At this point a side-to-side enterotomy  was created between the biliopancreatic and Roux limb, making  enterotomies with the Harmonic scalpel and utilizing a 45-mm white load  linear stapler.  The staple line was intact without bleeding.  The  common enterotomy was closed from either corner with running 2-0 Vicryl.  The mesenteric defect beneath the anastomosis was then exposed and  closed with a running 2-0 silk stitch.  Following this the patient was  placed in steep reverse Trendelenburg and Davidson retractor was used to  elevate the left lobe of the liver.  The angle of His was mobilized and  dissected with the Harmonic scalpel.  A 4-cm gastric pouch was measured  along the lesser curve.  The peritoneum was incised along the edge with  Harmonic scalpel and the dissection was carried along the gastric wall  toward the lesser sac.  The free lesser sac was entered.  All tubes were  removed from the stomach.  Initial firing of the gold echelon 60-mm  stapler was performed for about 4 cm right angle to the lesser curve.  Following this, firing of the blue load Echelon was performed creating a  tubular pouch up toward the dissected area of the angle of His.  Two or  three more firings of 45-mm blue load stapler to allow angling up to the  previously dissected area of the angle of His were performed.  The  gastric pouch completely separated from the remnant.  The staple line of  the remnant was oversewn with running 2-0 silk.  Tisseel was applied to  the upper portion  of the staple line of the pouch.  The Roux limb was  then brought up to the pouch with the candy cane facing toward the  patient's left and came up without any undue tension.  Anastomosis was  created then between the Roux limb and the small gastric pouch with an  initial posterior running suture of 2-0 Vicryl.  Enterotomies were  created in the pouch and the Roux limb with the Harmonic scalpel and  then anastomosis was created about 2 cm in length with a linear blue  load 45-mm stapler.  The staple line was intact and without bleeding.  The common enterotomy was then closed from either corner with running 2-  0 Vicryl.  The Ewald tube was then passed down through the anastomosis  without difficulty and an outer anterior layer of seromuscular 2-0  Vicryl was placed.  Following this, with the outlet of the pouch  clamped, Dr. Ezzard Standing performed upper endoscopy and tensely distended the  pouch with air and under saline irrigation there was no evidence of  leak.  The air was suctioned and the pouch desufflated.  The abdomen was  irrigated and inspected for hemostasis which appeared complete.  Suture  and staple lines around the gastrojejunostomy were coated with Tisseel  tissue sealant.  Following this, the [facility] retractor was removed, all  CO2 evacuated.  Trocars removed.  Skin incisions were closed with  subcuticular Monocryl and Dermabond.  Sponge and needle counts were  correct.  The patient was taken to recovery in good condition.      Lorne Skeens. Hoxworth, M.D.  Electronically Signed     BTH/MEDQ  D:  12/25/2007  T:  12/25/2007  Job:  [id number]

## 2010-10-06 NOTE — Assessment & Plan Note (Signed)
[facility]                             PULMONARY OFFICE NOTE   [name], [name]                      MRN:          [id number]  DATE:01/06/2007                            DOB:          09-28-1965    PROBLEM:  1. Chronic insomnia.  2. Questionable sleep hygiene.  3. Sleep apnea.  4. Allergic rhinitis (Dr. Lucie Leather).  5. 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   HISTORY:  She is using Lunesta 3 mg but saying that she needs two of  them to fall asleep and she cannot afford that.  She will take one  Lunesta with 3 or 4 melatonin and says that gets her 4 or 5 hours of  sleep.  She passed out at work yesterday.  There was no trauma and  what little she can describe sounds as if she may have simply fallen  asleep.  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   MEDICATION:  1. Singulair 10 mg.  2. Zyrtec 10 mg.  3. Prevacid.  4. Astelin.  5. She will take Benadryl with phenylephrine for headache.  6. Nortriptyline 25 mg at h.s.  7. Melatonin.  8. Lunesta 3 mg.  9. Albuterol rescue inhaler.  10.Nasonex.  11.She has an EpiPen.  12.Occasional Darvocet.   DRUG INTOLERANT:  VICODIN.   She did look at the cognitive behavioral therapy website and implies  that she is trying to follow along with that.  She describes difficulty  initiating and maintaining sleep.  She needs to wear earplugs and an eye  mask.  Thyroid tested normal.  I do not get a history suggesting sleep  paralysis but some of her responses sound a bit like cataplexy.   OBJECTIVE:  Weight 267 pounds, BP 102/78, pulse 85, room air saturation  100%.  Subdued affect.  She seems fully oriented, responsive and  interactive, just very quiet.  Pulse is regular.  There is no tremor, no  thyromegaly.  Breathing is unlabored.   IMPRESSION:  1. Obstructive sleep apnea with  insomnia.  2. Excessive daytime somnolence.  3. Complicated medical regimen with potential for excessive sedation      from medications.   PLAN:  Medication talk.  We discussed the possibility that her  difficulty sleeping at night and sleepiness during the daytime could  suggest the possibility of a disorder of primary excess somnolence such  as narcolepsy, although what she is bothered by the most is difficulty  falling asleep at night.  We discussed the possibility of doing a  multiple sleep latency test off of medications.  For now, I have asked  her to set the melatonin and Lunesta aside and try clonazepam 0.5 mg 1-3  at h.s. p.r.n. with careful discussion done.  I had given her a  prescription for this to try in May, she never got it filled and lost  the prescription.  We have discussed her responsibility to drive safely  and  we have reviewed basics of sleep hygiene.     Clinton D. Maple Hudson, MD, Tonny Bollman, FACP  Electronically Signed    CDY/MedQ  DD: 01/07/2007  DT: 01/08/2007  Job #: [id number]   cc:   [name], M.D.  [name], M.D.

## 2010-10-06 NOTE — Assessment & Plan Note (Signed)
[facility]                             PULMONARY OFFICE NOTE   [name], [name]                      MRN:          [id number]  DATE:02/08/2007                            DOB:          1965/10/04    PROBLEM:  1. Chronic insomnia.  2. Questionable sleep hygiene.  3. Sleep apnea.  4. Allergic rhinitis (Dr. Lucie Leather).  5. 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   HISTORY:  She reports that she is now sleeping better, and more  regularly.  She is using clonazepam 0.5 mg x2 nightly, and then if she  wakes later in the night, she is taking 2 or 3 melatonin tablets.  She  is also using earplugs and eye shadow mask.  She says she now feels  tired at 11 p.m., which is much better than her previous sleep schedule,  and she denies being tired during the day.  I do not get a history of  cataplexy or sleep paralysis.  We discussed melatonin, which is a Arts administrator, more appropriately taken an hour before desired bedtime.   She had some questions about allergy management, and had some issues,  apparently, with Dr. Kathyrn Lass staff about getting her FMLA forms filled.  As a courtesy, I filled a prescription for an Epipen for her because she  said she was out, but asked her to follow up with Dr. Lucie Leather, and to  talk through any questions she had about her allergy management with  him.   OBJECTIVE:  Weight 265 pounds.  BP 100/70.  Pulse regular at 70.  Room  air saturation 100%.  She is obese.  Quiet to the point of being reserved.  Jiggling her right  foot some.  Pulse regular.  Breathing unlabored.  Speech clear.   IMPRESSION:  Mild obstructive sleep apnea (AHI 10 per hour) with  insomnia.  Now doing much better.  Her index of 10 per hour is in a  borderline range for interventional therapy with primary interest still  being improvement of her nasal airway, weight loss, and sleep off slide  of back.  Her sleep quality is improved, and the  current regimen seems  appropriate.  Worsening symptoms of daytime somnolence or witnessed  apnea would be a basis for reconsideration of CPAP.   PLAN:  1. Clonazepam is refilled.  2. Melatonin is okay as long as it is tolerated.  I did suggest she      try taking it before bedtime.  3. Epipen prescription while she was here simply so she would have it      available pending her followup with Dr. Lucie Leather for her longterm      allergy management by him.  4. Schedule return in 4 months, earlier p.r.n.  5. She is again reminded of the importance of keeping her weight down,      and her responsibility to alert while driving.     Clinton D. Maple Hudson, MD, Tonny Bollman, FACP  Electronically Signed    CDY/MedQ  DD: 02/08/2007  DT: 02/09/2007  Job #: [id number]   cc:  Jessica Priest, M.D.  Reather Littler, M.D.

## 2010-10-09 ENCOUNTER — Ambulatory Visit (HOSPITAL_COMMUNITY): Payer: Self-pay

## 2010-10-09 ENCOUNTER — Ambulatory Visit (HOSPITAL_COMMUNITY)
Admission: RE | Admit: 2010-10-09 | Discharge: 2010-10-09 | Disposition: A | Discharge status: Home / Self Care | Payer: Self-pay | Source: Ambulatory Visit | Attending: Gynecology | Admitting: Gynecology

## 2010-10-09 DIAGNOSIS — Z1231 Encounter for screening mammogram for malignant neoplasm of breast: Secondary | ICD-10-CM | POA: Insufficient documentation

## 2010-10-09 NOTE — Assessment & Plan Note (Signed)
[facility]                               PULMONARY OFFICE NOTE   [name], [name]                      MRN:          [id number]  DATE:03/24/2006                            DOB:          1965-09-25    PULMONARY/SLEEP CONSULT:   PROBLEM:  45 year old woman referred through the courtesy of Dr. Lucie Leather, for  sleep evaluation with concern of insomnia.   HISTORY:  She says she has difficulty going to sleep and maintaining sleep.  She expects to wake every three or four hours. She is very drowsy around 9  or 10pm, but once in bed, considering bedtime 11pm, she will toss and turn,  turn the TV on and off and have difficulty relaxing. She is told that she  occasionally talks or mumbles a little in her sleep. During the day, she  fights drowsiness. She has taken some sleep medications and usually takes  extra doses, saying 2 or 3 tablets of a prescribed medication will help her  sleep five or six hours instead of the three hours she expects to sleep  without treatment. Usually, she uses over-the-counter sleep medications.  Occasionally, she notices some nasal congestion and she will use albuterol  perhaps every other day, but nasal complaints and dyspnea do not usually  interfere with nighttime sleep. She avoids naps and drinks a soft drink with  caffeine perhaps once every other day. She is easily disturbed at night by  any noise or changes in the environment. She had a sleep study, she thinks,  that was done years ago, but remembers no detail and says at that time she  was working second shift. She is now usually up by 9:30am.   REVIEW OF SYSTEMS:  I get no history suggesting cataplexy or hypnagogic  hallucination. She sleeps alone. Sometimes her 28 year old daughter may be  around and able to make some comment about her sleep pattern. Somatic  discomforts include indigestion, occasional cough, and depression. She is  not aware of leg cramps or  jerking. Sometimes dreams are vivid.   PAST HISTORY:  Evaluation for syncope with a positive tilt table test by Dr.  Graciela Husbands in 2000. Treatment for sub-sternal pain with gastrointestinal  evaluation by Dr. Jarold Motto, and medical treatment for therapeutic trial.  She had an abnormal manometry with nutcracker esophagus. Treatment for  asthma, elevated cholesterol, allergy and sinus trouble, chronic headaches,  and Graves disease. She has had tonsillectomy and hysterectomy.   MEDICATIONS:  1. Singular 10 mg.  2. Zyrtec 10 mg.  3. Prevacid.  4. Astelin.  5. BuSpar SR 150 mg.  6. Over-the-counter sleep medications.  7. Nasonex.  8. EpiPen.  9. Albuterol.  She is restarting allergy vaccine through Dr. Lucie Leather. She says she will take  four Benadryl/phenylephrine a day for headache and we discussed this. She is  drug intolerant of Vicodin.   SOCIAL HISTORY:  She has never smoked. No street drugs or alcohol. Divorced.  She has a 74 year old daughter and a 32 year old son. Several in the family  have allergy complaints. Mother with a clotting problem and  rheumatism. She  says her children sleep 10-12 hours a day or sometimes longer.   OBJECTIVE:  Weight 266 pounds, blood pressure 138/78, pulse regular 80, room  air saturation 100%. This is an obese, rather passive woman who is fully  oriented.  HEENT: Nose is wet, but not obstructed. Pharynx is not inflamed. Palette  length 3/4. Voice quality is normal with no strider.  No neck vein distension. There is a goiter.  LUNGS: Clear and unlabored.  HEART: Sounds regular without murmur or gallop.  EXTREMITIES: Without tremor, sweating or edema.   IMPRESSION:  1. Chronic insomnia.  2. Questionable sleep hygiene.  3. No history of snoring or restless legs to suggest that there is a      physiologic event disturbing nighttime sleep, so we will not get a      sleep study immediately.  4. Allergic rhinitis, managed by Dr. Lucie Leather.  5. History of  nutcracker esophagus.   PLAN:  1. Educated on sleep hygiene.  2. Try Lunesta 3 mg, number 15, 1 nightly p.r.n. occasional use, not to      increase dose.  3. Schedule return one month, earlier p.r.n.     Clinton D. Maple Hudson, MD, Tonny Bollman, FACP  Electronically Signed    CDY/MedQ  DD: 03/26/2006  DT: 03/27/2006  Job #: [id number]   cc:   Jessica Priest, M.D.  Reather Littler, M.D.

## 2010-10-09 NOTE — Op Note (Signed)
NAME:  [name], [name]                         ACCOUNT NO.:  [id number]   MEDICAL RECORD NO.:  [id number]                   PATIENT TYPE:  AMB   LOCATION:  ENDO                                 FACILITY:  [facility]   PHYSICIAN:  Vania Rea. Jarold Motto, M.D. [facility]        DATE OF BIRTH:  [birth year]   DATE OF PROCEDURE:  05/28/2003  DATE OF DISCHARGE:  05/28/2003                                 OPERATIVE REPORT   PROCEDURE PERFORMED:  Esophageal manometry.   Esophageal manometry was completed on May 28, 2003.  This manometry was  apparently extremely difficult to perform per the nurse's noted accompanying  this procedure. I do find this manometry difficult to interpret because of  frequent swallowing and a wandering baseline exam.  As best as I can  ascertain, the manometry results are as follows.   1. Upper esophageal sphincter.  There appears to be good coordination     between pharyngeal contraction and cricopharyngeal relaxation.  2. Lower esophageal sphincter.  This pressure was difficult to measure but     it does seem to be elevated greater than 50 mmHg.  However, there does     appear to be relaxation approximately 80% of the time.  3. Motility pattern.  There appears to be normal peristalsis throughout the     length of the esophagus.  However, the amplitude of contractions in the     distal esophagus was over 200 mmHg but there are no repetitive,     spontaneous or prolonged contractions.   ASSESSMENT:  This is an abnormal manometry with an elevated lower esophageal  sphincter pressure and evidence of hyperperistalsis.  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   RECOMMENDATIONS:  I will ask this patient to see me in the office in follow-  up and we will consider calcium channel blockers, sedatives and nitrate  therapy to see if this helps some of her symptomatology.     Vania Rea. Jarold Motto, M.D. [facility]    DRP/MEDQ  D:  06/04/2003  T:  06/04/2003  Job:  [id number]

## 2010-10-09 NOTE — Op Note (Signed)
NAME:  [name], [name]               ACCOUNT NO.:  [id number]   MEDICAL RECORD NO.:  [id number]          PATIENT TYPE:  OBV   LOCATION:  9399                          [facility]ACILITY:  [facility]   PHYSICIAN:  Ivor Costa. Farrel Gobble, M.D. DATE OF BIRTH:  Oct 31, 1965   DATE OF PROCEDURE:  04/20/2004  DATE OF DISCHARGE:                                 OPERATIVE REPORT   PREOPERATIVE DIAGNOSES:  1.  Dysfunctional bleeding.  2.  Menometrorrhagia.   POSTOPERATIVE DIAGNOSES:  1.  Dysfunctional bleeding.  2.  Menometrorrhagia.   PROCEDURE:  Laparoscopic-assisted vaginal hysterectomy.   SURGEON:  Ivor Costa. Farrel Gobble, M.D.   ASSISTANT:  Gaetano Hawthorne. Lily Peer, M.D.   ANESTHESIA:  General.   INTRAVENOUS FLUIDS:  1900 mL of lactated Ringer's.   ESTIMATED BLOOD LOSS:  .   URINE OUTPUT:  525 mL of clear urine.   FINDINGS:  A grossly enlarged uterus without distinct fibroids.  Normal  tubes and ovaries.   COMPLICATIONS:  None.   PATHOLOGY:  Uterus and cervix.   PROCEDURE IN DETAIL:  The patient was taken to the operating room.  General  anesthesia was induced, placed in the dorsal lithotomy position, prepped and  draped in the usual sterile fashion.  A bivalve speculum was placed in the  vagina.  The cervix was visualized.  The uterine manipulator was placed and  gentle traction was placed on the cervix and what was felt to be adequate  descent was appreciated.  The legs were then lowered, gloves were changed,  and infraumbilical incision was made with the scalpel through which the  Veress needle was inserted.  Opening pressure was 7.  A pneumoperitoneum was  then created until tympany was appreciated above the liver after which #10-  11 disposable trocar was inserted through the infraumbilical port.  Placement in the abdomen was confirmed.  Two lower 5 mm ports were placed  under direct visualization.  The patient was then placed in Trendelenburg.  The bowels were moved out of the pelvis.  The remainder  of the pelvis was  inspected and unremarkable.  The tubo-ovarian ligament on the left-hand side  was then grasped and treated with cautery and transected with the tripolar.  It was cauterized in two series and transected in between.  The wound  ligament was also similarly treated with the tripolar and transected.  The  posterior broad ligament was similarly treated and transected.  It was noted  to be hemostatic.  The dissected carried through a little more inferiorly.  The bladder flap was begun.   Attention was then turned towards the left adnexa.  In a similar fashion,  the round, posterior leaf of the broad ligament and tubo-ovarian ligaments  were treated with cautery, transected, again with the tripolar.  The bladder  flap was then elevated.  The Endoshears were placed in the lower port and a  bladder flap was created from round ligament to round ligament.  It was  gently dissected off the lower segment and cervix and noted to be  hemostatic.  At this point, part of the pneumoperitoneum was released  and  attention was then turned vaginally.   A sterile weighted speculum was then placed in the vagina.  The cervix was  then grasped through the [facility] tenaculum after the uterine manipulator was  removed.  The vaginal mucosa was injected circumferentially with a dilute  lidocaine solution.  The vaginal mucosa was then incised circumferentially  around the cervix.  The cervix was then deviated anterior and posterior  colpotomy was performed.  Placement in the abdomen was confirmed by release  of gas and the incision was extended.  The uterosacral ligaments were then  identified, transected, and suture ligated with 0 Vicryl.  This was done  bilaterally.  A long, sterile weighted speculum had been placed in the  vagina prior to this point.  The dissection carried through.  The cardinal  ligaments were transected and suture ligated.  The vaginal mucosa was  dissected off anteriorly as was  the loose area of tissue.  As the dissection  ensued, the anterior colpotomy site was reached and the retractor was placed  in the anterior colpotomy site.  Because of the bulk of the uterus, the  Haney clamp was not felt to be adequate to actually plicate the  anterior/posterior peritoneum appropriately and a parametrial clamp was then  exchanged and using the parametrial clamp, we were able to transect and  suture ligate the uterine vessels incorporating the anterior and posterior  peritoneum successfully.  This was done bilaterally and the dissection  carried through.  As the uterus was quite bulky, we needed to morcellate the  uterus in order for delivery purposes after the uterine vessels were  transected.  The posterior aspect of the uterus was grasped with a single-  tooth tenaculum and the uterus started to gently be morcellated out.  Once  the morcellation ensued, it was apparent that we had actually transected all  of her pedicles on the left hand side and only a small residual ended up on  the right which was clamped and transected and a free tie was placed.  The  pelvis was then irrigated with copious amounts of warm saline.  Inspection  of the pedicles assured hemostasis.  There was bleeding on the posterior  cuff which had been continuous throughout the case.  The posterior  peritoneum was grasped and approximated to the posterior vagina.  Then the  posterior peritoneum and vaginal mucosa were plicated in the midline from  above the uterosacral ligament to above the uterosacral ligament.  The  anterior peritoneum was then grasped with an Allis clamp and gently  reapproximated on the vaginal mucosa.  The vaginal mucosa was then  reapproximated laterally with figure-of-eights from uterosacral ligament to  uterosacral ligament.  We re-irrigated and we were noted to be hemostatic. Gloves were then changed, legs were lowered.  A pneumoperitoneum  was  recreated from above.   Inspections of our pedicles ensured [name] of hemostasis  with good plication of the peritoneum and no erroneous bleeders.  The ports  were then removed under direct visualization.  The pneumoperitoneum was  released.  The fascia on the infra-umbilical port was closed with figure-of-  eight of 0 Vicryl.  The skin was closed with Dermabond and all three ports  were injected for a total of 10 mL of 0.25% Marcaine.  The patient tolerated  the procedure well.  Sponge and needle counts were correct x2 and she was  transferred to the PACU in stable condition.     Trac  THL/MEDQ  D:  04/20/2004  T:  04/20/2004  Job:  [id number]

## 2010-10-09 NOTE — Procedures (Signed)
NAME:  [name], [name]               ACCOUNT NO.:  [id number]   MEDICAL RECORD NO.:  [id number]           PATIENT TYPE:   LOCATION:  SLEEP CENTER                 FACILITY:  [facility]   PHYSICIAN:  Clinton D. Maple Hudson, MD, FCCP, FACPDATE OF BIRTH:   DATE OF STUDY:  08/17/2006                            NOCTURNAL POLYSOMNOGRAM   REFERRING PHYSICIAN:   REFERRING PHYSICIAN:  Clinton D. Maple Hudson, M.D.   INDICATIONS FOR STUDY:  Insomnia with sleep apnea.  Epworth sleepiness  score 11/24, height 5 feet, 6 inches, weight 271 pounds.  Home  medications are listed and reviewed.   SLEEP ARCHITECTURE:  Total sleep time 310 minutes with sleep efficiency  71%.  Stage I was 6%, stage II 84%, stages III and IV 1%, REM 10% of  total sleep time.  Sleep latency 107 minutes, REM latency 87 minutes.  Awake after sleep onset 24 minutes, arousal index 12.4.  Alfonso Patten was  taken at 8 p.m. prior to arrival.   RESPIRATORY DATA:  Apnea/hypopnea index (AHI, RDI) 10 obstructive events  per hour indicating mild obstructive sleep apnea/hypopnea syndrome.  There were 13 obstructive apneas and 39 hypopneas.  Events were not  positional.  REM AHI 56.1.  There were insufficient events to qualify  for CPAP titration by split protocol on this study night.   OXYGEN DATA:  Mild to moderate intermittent snoring with oxygen  desaturation to a nadir of 83%.  Mean oxygen saturation through the  study was 95% on room air.   CARDIAC DATA:  Normal sinus rhythm.   MOVEMENT/PARASOMNIA:  Occasional limb jerk, insignificant.   IMPRESSION/RECOMMENDATION:  1. Note early time taking Lunesta at 8 p.m. and history of preferred      late bedtime with use of caffeine for stimulation during the day,      all suggesting potential poor sleep hygiene.  2. Mild obstructive sleep apnea/hypopnea syndrome, AHI 10 per hour      with nonpositional events, mild to moderate snoring, and oxygen      desaturation to a nadir of 83%.  3. A combination of  sleep hygiene education and weight loss are most      likely to be helpful initially.  CPAP would be a consideration if      more conservative measures prove insufficient.      Clinton D. Maple Hudson, MD, [facility], FACP  Diplomate, Biomedical engineer of Sleep Medicine  Electronically Signed     CDY/MEDQ  D:  08/28/2006 12:34:25  T:  08/28/2006 22:28:30  Job:  [id number]

## 2010-10-09 NOTE — Assessment & Plan Note (Signed)
[facility]                             PULMONARY OFFICE NOTE   Colleen, [name]                      MRN:          [id number]  DATE:07/05/2006                            DOB:          1966-05-21    Pulmonary/sleep followup.   PROBLEMS:  1. Chronic insomnia.  2. Questionable sleep hygiene.  3. Allergic rhinitis (Dr. Lucie Leather).  4. 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   HISTORY:  At her previous visit she had been given education on sleep  hygiene and a trial of Lunesta 3 mg.  She says that gives her at most 5  hours of sleep.  She has been taking it at 8:30 p.m. and getting in bed  to watch TV.  I explained she would do better to stay up until her  intended bedtime, allow 15 or 20 minutes of quiet relaxation such as  reading in bed, at which time she would take that Lunesta.  She is not  sleepy during the daytime at work, with a work schedule between 10 a.m.  and 7 p.m., but that points out that she is not exercising at all.  She  expects to toss and turn in her sleep, but says she can't shut my brain  off.  Legs feel restless only at bedtime.  We discussed the utility of  a sleep study with these complaints, primarily with the intention of  ruling out significant sleep-disordered breathing.   MEDICATIONS:  1. Singulair 10 mg.  2. Zyrtec 10 mg.  3. Prevacid.  4. Astelin.  5. Allergy vaccine with Dr. Lucie Leather.  6. Benadryl with phenylephrine OTC occasionally for headache.  7. Lexapro 20 mg.  8. Nortriptyline 25 mg nightly.  9. P.r.n. use of Nasonex.  10.Albuterol.  11.Lunesta 3 mg.  12.She has an EpiPen which is not needed.   ALLERGIES:  DRUG INTOLERANCE OF VICODIN.   OBJECTIVE:  Weight 264 pounds, BP 114/74, pulse regular 91, room air  saturation 100%.  She is an obese, tired-looking woman, puffy under the eyes without nasal  congestion.  Breathing is unlabored.  There is no obvious thyromegaly.  HEART:  Sounds are  regular without murmur.  There is no tremor and no obvious jiggling or jitteriness.   IMPRESSION:  I still think sleep hygiene problems are part of the issue,  but we will make an effort to exclude organic sleep disturbance.   PLAN:  1. We are scheduling a nocturnal polysomnogram with split titration      protocol with the [facility].  2. I have re-emphasized basics of good sleep hygiene and appropriate      use of her medication.  3. She will return for followup after her study.     Colleen D. Maple Hudson, MD, [name], FACP  Electronically Signed    CDY/MedQ  DD: 07/06/2006  DT: 07/07/2006  Job #: [id number]   cc:   [name], M.D.  [name], M.D.  [facility]

## 2010-10-09 NOTE — Letter (Signed)
March 30, 2007    [name]  [address]  [place], [place]  [address]   RE:  JAZYAH, BUTSCH  MRN:  [id number]  /  DOB:  1965/11/30   Dear [name]:   You have been under my medical care for treatment of sleep disorders  including obstructive sleep apnea.  We have recorded weights here  between 266 pounds on March 24, 2006 and 265 pounds on February 08, 2007.  Being overweight aggravates management of obstructive sleep  apnea.  Obstructive sleep apnea is associated with a number of medical  disorders and complications.  Weight loss would make a significant  improvement in your medical condition.  If more conservative measures  have not been helpful, then I certainly would support assessment for  consideration of bariatric surgery to help you with weight loss.    Sincerely,      Clinton D. Maple Hudson, MD, [facility]oll[facility], FACP  Electronically Signed    CDY/MedQ  DD: 03/29/2007  DT: 03/30/2007  Job #: [id number]

## 2010-10-09 NOTE — H&P (Signed)
NAME:  [name], [name]               ACCOUNT NO.:  [id number]   MEDICAL RECORD NO.:  [id number]          PATIENT TYPE:  OBV   LOCATION:  NA                            [facility]   PHYSICIAN:  Ivor Costa. Farrel Gobble, M.D. DATE OF BIRTH:  Feb 20, 1966   DATE OF ADMISSION:  04/20/2004  DATE OF DISCHARGE:                                HISTORY & PHYSICAL   PRINCIPAL DIAGNOSIS:  Dysfunctional bleeding.   HISTORY OF PRESENT ILLNESS:  The patient is a 45 year old, G2, P2 who has a  longstanding history of dysfunctional bleeding with severe menorrhagia. She  states that she had been amenorrheic for approximately 10 months. She was  placed on Seasonale and did not have a withdrawal bleed after the first  cycle.  She went into the second pack and again did not have a withdrawal  bleed.  She elected to discontinue the medication because she was having  severe moodiness and was having problems with her son as a result of that.  She also gained about 20 pounds. The patient states, however, that she began  bleeding September 23 and has been bleeding variable amounts every since to  the point where she feels unclean. She at her evaluation has had a TSH,  prolactin and endometrial biopsy all of which were normal.  She had an  ultrasound which is suggestive for adenomyosis but no distinct fibroids are  seen.  Her lining was 13.9 and trilayered, her uterus was 13.3 x 6.3 x 8.4  cm.  She has not been sexually active for approximately five years. Of note,  she has a history of Graves disease which is well controlled by Dr. Lucianne Muss.  She has a history of normal Pap smears, she has had two vaginal deliveries  11 and 17 years ago and no other GYN complaints.   PAST MEDICAL HISTORY:  Significant for asthma.   PAST SURGICAL HISTORY:  Negative.   MEDICATIONS:  She is on Zyrtec, Singulair, Albuterol, Nasacort and Pulmicort  as well as Aygestin.   ALLERGIES:  She has no known drug allergies.   SOCIAL HISTORY:   No alcohol, tobacco or formal exercise.   FAMILY HISTORY:  Negative for gynecologic cancers.   PHYSICAL EXAMINATION:  GENERAL:  She is a well appearing female in no acute  distress.  HEART:  Regular rate.  LUNGS:  Clear to auscultation.  ABDOMEN:  Obese, soft and nontender.  PELVIC:  On bimanual exam normal external genitalia, the BUS was negative.  There was a small amount of thin blood in the vault. The cervix was without  gross lesions. The uterus is axial, mobile and nontender as are the adnexa.  Rectovaginal exam was deferred.   ASSESSMENT:  History of dysfunctional bleeding with normal labs, ultrasound  and biopsy results.  The patient was offered several options including  possible benefits from embolization versus [facility] IUD, versus ablation.  She  would prefer, however, for more definitive surgery because she is only 45  years old. We discussed the possibility of doing a laparoscopic assisted  vaginal hysterectomy. The patient, however, is contemplating converting  that  to an abdominal hysterectomy and will let us know at her surgery which route  she prefers to go.     Trac   THL/MEDQ  D:  04/15/2004  T:  04/15/2004  Job:  [id number]

## 2010-10-09 NOTE — Assessment & Plan Note (Signed)
[name]                             PULMONARY OFFICE NOTE   [name], [name]                      MRN:          [id number]  DATE:09/22/2006                            DOB:          05-26-1965    PROBLEM:  1. Chronic insomnia.  2. Questionable sleep hygiene.  3. Mild sleep apnea.  4. Allergic rhinitis (Dr. Lucie Leather).  5. 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   HISTORY:  She returns after her sleep study done August 17, 2006, which  showed mild obstructive apnea with an index of 10 per hour, mild to  moderate snoring and desaturation to 83%.  There were not enough events  to qualify for CPAP titration and conservative therapy was recommended.  It was noted that she gave a history preferring late bedtimes, use of  caffeine during the day for stimulation and taking Lunesta as early as 8  p.m., all suggesting poor sleep hygiene.  Her Epworth Sleepiness Score  was 11/24, indicating mildly abnormal daytime sleepiness.  We talked  about sleep hygiene, behavioral changes as opposed to medication  approaches for insomnia complaints and appropriate timing and  expectations for sleep medications.  She says she is often using 2 of  the 3-mg Lunesta tablets.  She is tapering prednisone and I explained  that prednisone can be a stimulant exacerbating insomnia.   OBJECTIVE:  Weight 268 pounds.  BP 128/80, pulse regular at 87, room air  saturation 99%.  She is obese, calm, cooperative.  Nasal airway is not obstructed.  Voice quality is normal.  There is no  stridor or neck vein distention, no thyromegaly.  CHEST:  Quiet, breathing unlabored.  HEART:  Sounds regular without murmur.  EXTREMITIES:  Without tremor.  Palms are dry.  NEUROLOGIC:  Unremarkable for observation.   IMPRESSION:  1. Primary problem is insomnia, likely aggravated by a component of      poor sleep habits.  2. Mild obstructive apnea with an index of 10 per hour should be     managed conservatively with encouragement to lose weight and to      sleep off flat of back initially.   PLAN:  1. She is referred to an insomnia web site for cognitive behavioral      therapy help.  2. Sleep hygiene.  3. Try clonazepam instead of Lunesta 0.5 mg one to three at h.s.,      building as discussed with careful discussion of this medication.  4. She will follow up with her other physicians appropriately.  5. Schedule return here in 1 month, earlier p.r.n.     Clinton D. Maple Hudson, MD, [facility], FACP  Electronically Signed    CDY/MedQ  DD: 09/22/2006  DT: 09/23/2006  Job #: [id number]   cc:   Jessica Priest, M.D.  Reather Littler, M.D.

## 2010-10-09 NOTE — Discharge Summary (Signed)
NAME:  [name], [name]               ACCOUNT NO.:  [id number]   MEDICAL RECORD NO.:  [id number]          PATIENT TYPE:  OBV   LOCATION:  9317                          FACILITY:  [facility]   PHYSICIAN:  Ivor Costa. Farrel Gobble, M.D. DATE OF BIRTH:  1965-06-23   DATE OF ADMISSION:  04/20/2004  DATE OF DISCHARGE:  04/21/2004                                 DISCHARGE SUMMARY   PRINCIPAL DIAGNOSIS:  Menometrorrhagia and dysfunctional bleeding.   PRINCIPAL PROCEDURE:  Laparoscopically assisted vaginal hysterectomy.   Refer  to the H&P.   HOSPITAL COURSE:  The patient presented on the afternoon of April 20, 2004 and underwent an LAVH under general anesthesia with an estimated blood  loss of approximately 250 mL.  The findings were grossly enlarged uterus  without any distinct fibroids, normal tubes and ovaries.  The patient was  extubated in the OR and transferred to the PACU in stable condition.  Her  postoperative course was unremarkable.  On the morning of postoperative day  #1, the patient had had some emesis but was otherwise without any  complaints.  She was having no vaginal bleeding.  By the evening of  postoperative day #1, the patient was tolerating a regular diet, she was  ambulating, she was only complaining of some mild gas pain. She had normal  active bowel sounds. Her abdomen was nontender, her incisions were clean and  dry. Her heart was regular rate.  Her lungs were clear.  Because of some  itchiness with Percocet, the patient was discharged home a prescription for  Ultram 50 mg, 1-2 p.o. q.6 h. p.r.n. pain #30. She was instructed to  followup in the office in two weeks and not to drive.  Limited activities  were also discussed.   POSTOP LABS:  Her hemoglobin was 10, her hematocrit was 29.6, her platelets  were 281 and her white count was 9.9.     Trac   THL/MEDQ  D:  04/21/2004  T:  04/21/2004  Job:  [id number]

## 2010-10-15 ENCOUNTER — Ambulatory Visit (HOSPITAL_COMMUNITY): Payer: Self-pay

## 2010-12-18 ENCOUNTER — Other Ambulatory Visit: Payer: Self-pay | Admitting: Women's Health

## 2011-02-10 ENCOUNTER — Telehealth: Payer: Self-pay

## 2011-02-10 DIAGNOSIS — G4700 Insomnia, unspecified: Secondary | ICD-10-CM

## 2011-02-10 MED ORDER — ZOLPIDEM TARTRATE 10 MG PO TABS: 10.0000 mg | ORAL_TABLET | Freq: Every evening | ORAL | Status: DC | PRN

## 2011-02-10 NOTE — Telephone Encounter (Signed)
PT REQUESTING TO TALK TO YOU ABOUT RX TO HELP SLEEP AND PANIC ATTACKS. STATES IT IS 2 YEARS SINCE MOM PASSED AWAY.I TRIED TO GET HER TO SET UP OV FOR PELVIC EXAM. SEE MULTIPLE NURSE MESSAGES IN PAPER CHART. STATES SHE CAN COME IN SOON TO SEE YOU IF WE ACCEPT MEDICAID. NOT SLEEPING AT ALL AND IS ASKING FOR IMMEDIATE RELIEF UNTIL SHE CAN SEE YOU FOR OV. ALSO HAS CURRENT PAP & MAMMO. IN CHART. TOLD PT. IT MIGHT BE 02-11-11 BEFORE YOU COULD TALK TO HER. SHE STATES SHE IS O.K. AND WILL JUST USE OTC UNTIL THEN BUT IT IS NOT HELPING.

## 2011-02-10 NOTE — Telephone Encounter (Signed)
Telephone call to patient, states has had difficulty sleeping, anniversary of her mothersdeath, and son away at college. States has been unable to find a job. She does have Medicaid, and will schedule an office visit. Sleep hygiene reviewed, will try Ambien 10 mg by mouth at bedtime #15 called in. Denies any feelings of harming self. Encouraged counseling.

## 2011-02-16 ENCOUNTER — Other Ambulatory Visit: Payer: Self-pay | Admitting: *Deleted

## 2011-02-16 NOTE — Telephone Encounter (Signed)
NO APPT SET UP.

## 2011-02-17 MED ORDER — CLONAZEPAM 1 MG PO TABS: 1.0000 mg | ORAL_TABLET | Freq: Every evening | ORAL | Status: DC | PRN

## 2011-02-17 NOTE — Telephone Encounter (Signed)
rx called in [place] 

## 2011-02-19 LAB — CBC
HCT: 32.2 — ABNORMAL LOW
HCT: 37.4
Hemoglobin: 10.6 — ABNORMAL LOW
Hemoglobin: 12.6
MCHC: 33
MCHC: 33.7
MCV: 85.7
MCV: 86.1
Platelets: 223
Platelets: 244
RBC: 3.74 — ABNORMAL LOW
RBC: 4.37
RDW: 13.4
RDW: 13.5
WBC: 10.8 — ABNORMAL HIGH
WBC: 14.7 — ABNORMAL HIGH

## 2011-02-19 LAB — DIFFERENTIAL
Basophils Absolute: 0
Basophils Absolute: 0
Basophils Relative: 0
Basophils Relative: 0
Eosinophils Absolute: 0
Eosinophils Absolute: 0
Eosinophils Relative: 0
Eosinophils Relative: 0
Lymphocytes Relative: 14
Lymphocytes Relative: 5 — ABNORMAL LOW
Lymphs Abs: 0.7
Lymphs Abs: 1.5
Monocytes Absolute: 1
Monocytes Absolute: 1
Monocytes Relative: 10
Monocytes Relative: 7
Neutro Abs: 13 — ABNORMAL HIGH
Neutro Abs: 8.1 — ABNORMAL HIGH
Neutrophils Relative %: 76
Neutrophils Relative %: 89 — ABNORMAL HIGH

## 2011-02-19 LAB — HEMOGLOBIN AND HEMATOCRIT, BLOOD
HCT: 40
HCT: 38.4
HCT: 39.1
Hemoglobin: 13.3
Hemoglobin: 12.7
Hemoglobin: 13.1

## 2011-02-19 LAB — BASIC METABOLIC PANEL
BUN: 13
CO2: 27
Calcium: 9.4
Chloride: 103
Creatinine, Ser: 0.77
GFR calc Af Amer: >60
GFR calc non Af Amer: >60
Glucose, Bld: 92
Potassium: 3.8
Sodium: 137

## 2011-03-18 ENCOUNTER — Other Ambulatory Visit: Payer: Self-pay | Admitting: Women's Health

## 2011-03-22 ENCOUNTER — Telehealth: Payer: Self-pay

## 2011-03-22 NOTE — Telephone Encounter (Signed)
Pt. Was advised [place] refilled #30 in hopes she will have Medicaid card and get appt in the next month.  Per [place]  I gave patient info on free pap clinic that [place] will be working at on Nov 8th and gave her ph # to call for info/to schedule. ka

## 2011-03-22 NOTE — Telephone Encounter (Signed)
The pharmacy sent request for patient's estradiol to be refilled.  I called patient since last ce March 2011 and she needs to schedule. Patient said she is awaiting Medicaid card and the Medicaid office told her to check back Oct 29 to see if it is ready.  Advised patient [place] agreed to refill #30 in hopes that she will come for checkup in the next month.  Per [place], I also told her about the free pap clinic [place] working at on [place]th in [place]. I gave her Christine's ph # [phone] to call and get info/schedule per [place].  Pt. Said she would call her.  ka

## 2011-03-22 NOTE — Telephone Encounter (Signed)
Pharmacy sent request for Estrace refill.  I called patient since her last CE was March 2011.  She said she was waiting on Medicaid card and the Medicaid office told her it would not be ready before Oct 29th and she could check with them then.  Please advise regarding refill. thanks

## 2011-04-07 ENCOUNTER — Other Ambulatory Visit: Payer: Self-pay | Admitting: Women's Health

## 2011-04-08 ENCOUNTER — Other Ambulatory Visit: Payer: Self-pay | Admitting: Women's Health

## 2011-04-08 NOTE — Telephone Encounter (Signed)
RX CALLED TO THE PHARMACY

## 2011-04-19 ENCOUNTER — Encounter: Payer: Self-pay | Admitting: Women's Health

## 2011-05-05 ENCOUNTER — Emergency Department (HOSPITAL_COMMUNITY): Payer: Medicaid Other

## 2011-05-05 ENCOUNTER — Encounter: Payer: Self-pay | Admitting: *Deleted

## 2011-05-05 ENCOUNTER — Other Ambulatory Visit: Payer: Self-pay

## 2011-05-05 ENCOUNTER — Emergency Department (HOSPITAL_COMMUNITY)
Admission: EM | Admit: 2011-05-05 | Discharge: 2011-05-05 | Disposition: A | Discharge status: Home / Self Care | Payer: Medicaid Other | Attending: Emergency Medicine | Admitting: Emergency Medicine

## 2011-05-05 DIAGNOSIS — IMO0001 Reserved for inherently not codable concepts without codable children: Secondary | ICD-10-CM | POA: Insufficient documentation

## 2011-05-05 DIAGNOSIS — J111 Influenza due to unidentified influenza virus with other respiratory manifestations: Secondary | ICD-10-CM | POA: Insufficient documentation

## 2011-05-05 DIAGNOSIS — R0602 Shortness of breath: Secondary | ICD-10-CM | POA: Insufficient documentation

## 2011-05-05 DIAGNOSIS — K219 Gastro-esophageal reflux disease without esophagitis: Secondary | ICD-10-CM | POA: Insufficient documentation

## 2011-05-05 DIAGNOSIS — E0500 Thyrotoxicosis with diffuse goiter without thyrotoxic crisis or storm: Secondary | ICD-10-CM | POA: Insufficient documentation

## 2011-05-05 DIAGNOSIS — J45909 Unspecified asthma, uncomplicated: Secondary | ICD-10-CM | POA: Insufficient documentation

## 2011-05-05 DIAGNOSIS — R42 Dizziness and giddiness: Secondary | ICD-10-CM | POA: Insufficient documentation

## 2011-05-05 DIAGNOSIS — R079 Chest pain, unspecified: Secondary | ICD-10-CM | POA: Insufficient documentation

## 2011-05-05 HISTORY — DX: Thyrotoxicosis with diffuse goiter without thyrotoxic crisis or storm: E05.00

## 2011-05-05 HISTORY — DX: Gastro-esophageal reflux disease without esophagitis: K21.9

## 2011-05-05 LAB — HEPATIC FUNCTION PANEL
ALT: 10 U/L (ref 0–35)
AST: 14 U/L (ref 0–37)
Albumin: 3.7 g/dL (ref 3.5–5.2)
Alkaline Phosphatase: 80 U/L (ref 39–117)
Bilirubin, Direct: <0.1 mg/dL (ref 0.0–0.3)
Total Bilirubin: 0.1 mg/dL — ABNORMAL LOW (ref 0.3–1.2)
Total Protein: 7.4 g/dL (ref 6.0–8.3)

## 2011-05-05 LAB — POCT I-STAT, CHEM 8
BUN: 11 mg/dL (ref 6–23)
Calcium, Ion: 1.21 mmol/L (ref 1.12–1.32)
Chloride: 106 mEq/L (ref 96–112)
Creatinine, Ser: 0.9 mg/dL (ref 0.50–1.10)
Glucose, Bld: 168 mg/dL — ABNORMAL HIGH (ref 70–99)
HCT: 41 % (ref 36.0–46.0)
Hemoglobin: 13.9 g/dL (ref 12.0–15.0)
Potassium: 3.4 mEq/L — ABNORMAL LOW (ref 3.5–5.1)
Sodium: 143 mEq/L (ref 135–145)
TCO2: 26 mmol/L (ref 0–100)

## 2011-05-05 LAB — CBC
HCT: 38.6 % (ref 36.0–46.0)
Hemoglobin: 12.6 g/dL (ref 12.0–15.0)
MCH: 28.2 pg (ref 26.0–34.0)
MCHC: 32.6 g/dL (ref 30.0–36.0)
MCV: 86.4 fL (ref 78.0–100.0)
Platelets: 255 10*3/uL (ref 150–400)
RBC: 4.47 MIL/uL (ref 3.87–5.11)
RDW: 13.4 % (ref 11.5–15.5)
WBC: 6.8 10*3/uL (ref 4.0–10.5)

## 2011-05-05 LAB — POCT I-STAT TROPONIN I: Troponin i, poc: 0 ng/mL (ref 0.00–0.08)

## 2011-05-05 MED ORDER — OXYCODONE-ACETAMINOPHEN 5-325 MG PO TABS
1.0000 | ORAL_TABLET | Freq: Once | ORAL | Status: AC
  Administered 2011-05-05: 1 via ORAL
  Filled 2011-05-05: qty 1

## 2011-05-05 NOTE — ED Notes (Signed)
Pt came to ED complaining of CP and left arm pain since Monday. Pt also stated that she has N/V, cough, and has been anxious since Monday. Pt stated that her daughter had the flu last week. Lung sounds are clear. Heart sounds are normal. Currently patient is not in any pain. Pt seems anxious at this time. Pt is not SOB. Will continue to monitor.

## 2011-05-05 NOTE — ED Provider Notes (Signed)
History     CSN: [id number] Arrival date & time: 05/05/2011  7:00 PM   First MD Initiated Contact with Patient 05/05/11 2114      Chief Complaint  Patient presents with  . Chest Pain  . Dizziness  . Shortness of Breath    (Consider location/radiation/quality/duration/timing/severity/associated sxs/prior treatment) Patient is a 45 y.o. female presenting with chest pain and shortness of breath. The history is provided by the patient.  Chest Pain Primary symptoms include shortness of breath.    Shortness of Breath  Associated symptoms include chest pain and shortness of breath.  She has been sick for the last 4 days with fevers eyes 103, intermittent left-sided chest pain, and intermittent wheezing. She has had generalized bodyaches. There's been a sore throat. No nausea, vomiting, or diarrhea. She has used her albuterol inhaler which does give her some temporary relief of her cough and chest tightness. She does admit exposure to someone with influenza, and has not received her flu shot this year. She also has concerns of having difficulty with concentration and having episodes where she can't remember what had happened recently. She recently had an episode where she found herself in her car at the site of the retina did know how she got there. She does have a history of being treated for Graves' disease with radioactive iodine and she states that about 6 months ago her endocrinologist occur off of thyroid hormone. She has not taken any thyroid hormone in the intervening time.  Past Medical History  Diagnosis Date  . Asthma   . GERD (gastroesophageal reflux disease)   . Grave's disease     Past Surgical History  Procedure Date  . Abdominal hysterectomy     partial  . Gastric bypass     History reviewed. No pertinent family history.  History  Substance Use Topics  . Smoking status: Never Smoker   . Smokeless tobacco: Not on file  . Alcohol Use: No    OB History    Grav  Para Term Preterm Abortions TAB SAB Ect Mult Living                  Review of Systems  Respiratory: Positive for shortness of breath.   Cardiovascular: Positive for chest pain.  All other systems reviewed and are negative.    Allergies  Bee; Mushroom ext cmplx(shiitake-reishi-mait); Shellfish allergy; and Hydrocodone-acetaminophen  Home Medications   Current Outpatient Rx  Name Route Sig Dispense Refill  . CLONAZEPAM 1 MG PO TABS Oral Take 1 mg by mouth at bedtime as needed. For anxiety.    Marland Kitchen EPINEPHRINE 0.3 MG/0.3ML IJ DEVI Intramuscular Inject 0.3 mg into the muscle as needed. For allergic reaction to stinging insects, mushrooms, and shellfish     . ESTRADIOL 0.5 MG PO TABS Oral Take 0.5 mg by mouth daily.      . MOMETASONE FURO-FORMOTEROL FUM 100-5 MCG/ACT IN AERO Inhalation Inhale 2 puffs into the lungs 2 (two) times daily.      Marland Kitchen RANITIDINE HCL 150 MG PO TABS Oral Take 150 mg by mouth 2 (two) times daily.      Marland Kitchen ZOLPIDEM TARTRATE 10 MG PO TABS Oral Take 10 mg by mouth at bedtime as needed. For sleep.       BP 123/79  Pulse 92  Temp(Src) 98.1 F (36.7 C) (Oral)  Resp 13  SpO2 100%  Physical Exam  Nursing note and vitals reviewed.  45 year old female who is resting comfortably and in  no acute distress. Vital signs are normal. Head is normocephalic and atraumatic. PERRLA, EOMI. There's no sinus tenderness. Oropharynx is clear. Neck is supple without adenopathy or JVD. Back is nontender. Lungs have a few faint rales at the left base, no wheezes or rhonchi. Heart has regular rate and rhythm without murmur. There is no chest wall tenderness. Abdomen is soft, flat, nontender without any masses or hepatosplenomegaly. Extremities have full range of motion, nose cyanosis or edema. Skin is warm and moist without rash. Neurologic: Mental status is normal, cranial nerves are intact, there are no motor or sensory deficits. Psychiatric: Normal mood and affect.  ED Course  Procedures  (including critical care time)  Labs Reviewed  POCT I-STAT, CHEM 8 - Abnormal; Notable for the following:    Potassium 3.4 (*)    Glucose, Bld 168 (*)    All other components within normal limits  CBC  POCT I-STAT TROPONIN I  I-STAT, CHEM 8  I-STAT TROPONIN I  HEPATIC FUNCTION PANEL  TSH   No results found.  Results for orders placed during the hospital encounter of 05/05/11  CBC      Component Value Range   WBC 6.8  4.0 - 10.5 (K/uL)   RBC 4.47  3.87 - 5.11 (MIL/uL)   Hemoglobin 12.6  12.0 - 15.0 (g/dL)   HCT 04.5  40.9 - 81.1 (%)   MCV 86.4  78.0 - 100.0 (fL)   MCH 28.2  26.0 - 34.0 (pg)   MCHC 32.6  30.0 - 36.0 (g/dL)   RDW 91.4  78.2 - 95.6 (%)   Platelets 255  150 - 400 (K/uL)  POCT I-STAT, CHEM 8      Component Value Range   Sodium 143  135 - 145 (mEq/L)   Potassium 3.4 (*) 3.5 - 5.1 (mEq/L)   Chloride 106  96 - 112 (mEq/L)   BUN 11  6 - 23 (mg/dL)   Creatinine, Ser 2.13  0.50 - 1.10 (mg/dL)   Glucose, Bld 086 (*) 70 - 99 (mg/dL)   Calcium, Ion 5.78  4.69 - 1.32 (mmol/L)   TCO2 26  0 - 100 (mmol/L)   Hemoglobin 13.9  12.0 - 15.0 (g/dL)   HCT 62.9  52.8 - 41.3 (%)  POCT I-STAT TROPONIN I      Component Value Range   Troponin i, poc 0.00  0.00 - 0.08 (ng/mL)   Comment 3           HEPATIC FUNCTION PANEL      Component Value Range   Total Protein 7.4  6.0 - 8.3 (g/dL)   Albumin 3.7  3.5 - 5.2 (g/dL)   AST 14  0 - 37 (U/L)   ALT 10  0 - 35 (U/L)   Alkaline Phosphatase 80  39 - 117 (U/L)   Total Bilirubin 0.1 (*) 0.3 - 1.2 (mg/dL)   Bilirubin, Direct <2.4  0.0 - 0.3 (mg/dL)   Indirect Bilirubin NOT CALCULATED  0.3 - 0.9 (mg/dL)   Dg Chest 2 View  40/02/2724  *RADIOLOGY REPORT*  Clinical Data: Chest pain and shortness of breath.  CHEST - 2 VIEW  Comparison: 04/19/2009  Findings: Asymmetric elevation of the left hemidiaphragm, as before. The lungs are clear without focal infiltrate, edema, pneumothorax or pleural effusion. The cardiopericardial silhouette is  enlarged. Imaged bony structures of the thorax are intact.  IMPRESSION:   No acute findings.  Original Report Authenticated By: ERIC A. MANSELL, M.D.   Ct Head Wo Contrast  05/05/2011  *RADIOLOGY REPORT*  Clinical Data: Headache  CT HEAD WITHOUT CONTRAST  Technique:  Contiguous axial images were obtained from the base of the skull through the vertex without contrast.  Comparison: 08/06/2009  Findings: There is no evidence for acute hemorrhage, hydrocephalus, mass lesion, or abnormal extra-axial fluid collection.  No definite CT evidence for acute infarction.  The visualized paranasal sinuses and mastoid air cells are clear.  IMPRESSION: Normal exam.  Original Report Authenticated By: ERIC A. MANSELL, M.D.     No diagnosis found.    MDM  Chest pain, cough, fever, and dyspnea which most likely represents influenza. She has multiple other complaints that are of unclear cause. Of concern, she states that she had radioactive iodine treatment for Graves' disease and has not put on any thyroid replacement for several months. She describes difficulty concentrating and episodes where she can't remember how she got to where she is a. CT scan of the brain will be obtained to rule out tumor. TSH has been drawn to evaluate possible hypothyroidism. TSH result will not be back tonight, and she agrees to followup with her endocrinologist who can check on the report.  Workup has come back essentially negative. I have some concerns of some vague neurologic complaints in a young female could represent multiple sclerosis. I have advised that she followup with [facility] for further evaluation.        Dione Booze, MD 05/05/11 [phone]

## 2011-05-05 NOTE — ED Notes (Signed)
Pt reports 3 weeks hx of intermittent left sided chest pain radiating into left arm. Pt has list of symptoms which include:  sob, anxiety, insomnia, stress, confusing, migraines, double vision and un coordination. Pt reports went to doctor regarding respiratory infection.

## 2011-05-06 LAB — TSH: TSH: 1.398 u[IU]/mL (ref 0.350–4.500)

## 2011-05-14 ENCOUNTER — Encounter (HOSPITAL_COMMUNITY): Payer: Self-pay | Admitting: *Deleted

## 2011-05-14 ENCOUNTER — Other Ambulatory Visit: Payer: Self-pay

## 2011-05-14 ENCOUNTER — Emergency Department (HOSPITAL_COMMUNITY)
Admission: EM | Admit: 2011-05-14 | Discharge: 2011-05-15 | Disposition: A | Discharge status: Home / Self Care | Payer: Medicaid Other | Attending: Emergency Medicine | Admitting: Emergency Medicine

## 2011-05-14 ENCOUNTER — Emergency Department (HOSPITAL_COMMUNITY): Payer: Medicaid Other

## 2011-05-14 DIAGNOSIS — M545 Low back pain, unspecified: Secondary | ICD-10-CM | POA: Insufficient documentation

## 2011-05-14 DIAGNOSIS — R05 Cough: Secondary | ICD-10-CM

## 2011-05-14 DIAGNOSIS — M25569 Pain in unspecified knee: Secondary | ICD-10-CM | POA: Insufficient documentation

## 2011-05-14 DIAGNOSIS — R5381 Other malaise: Secondary | ICD-10-CM | POA: Insufficient documentation

## 2011-05-14 DIAGNOSIS — M5430 Sciatica, unspecified side: Secondary | ICD-10-CM | POA: Insufficient documentation

## 2011-05-14 DIAGNOSIS — R5383 Other fatigue: Secondary | ICD-10-CM | POA: Insufficient documentation

## 2011-05-14 DIAGNOSIS — R51 Headache: Secondary | ICD-10-CM | POA: Insufficient documentation

## 2011-05-14 DIAGNOSIS — R404 Transient alteration of awareness: Secondary | ICD-10-CM | POA: Insufficient documentation

## 2011-05-14 DIAGNOSIS — K219 Gastro-esophageal reflux disease without esophagitis: Secondary | ICD-10-CM | POA: Insufficient documentation

## 2011-05-14 DIAGNOSIS — R12 Heartburn: Secondary | ICD-10-CM | POA: Insufficient documentation

## 2011-05-14 DIAGNOSIS — R059 Cough, unspecified: Secondary | ICD-10-CM | POA: Insufficient documentation

## 2011-05-14 DIAGNOSIS — R55 Syncope and collapse: Secondary | ICD-10-CM | POA: Insufficient documentation

## 2011-05-14 DIAGNOSIS — J3489 Other specified disorders of nose and nasal sinuses: Secondary | ICD-10-CM | POA: Insufficient documentation

## 2011-05-14 DIAGNOSIS — R209 Unspecified disturbances of skin sensation: Secondary | ICD-10-CM | POA: Insufficient documentation

## 2011-05-14 DIAGNOSIS — E0500 Thyrotoxicosis with diffuse goiter without thyrotoxic crisis or storm: Secondary | ICD-10-CM | POA: Insufficient documentation

## 2011-05-14 DIAGNOSIS — J45909 Unspecified asthma, uncomplicated: Secondary | ICD-10-CM | POA: Insufficient documentation

## 2011-05-14 DIAGNOSIS — M5432 Sciatica, left side: Secondary | ICD-10-CM

## 2011-05-14 DIAGNOSIS — Z79899 Other long term (current) drug therapy: Secondary | ICD-10-CM | POA: Insufficient documentation

## 2011-05-14 DIAGNOSIS — M5380 Other specified dorsopathies, site unspecified: Secondary | ICD-10-CM | POA: Insufficient documentation

## 2011-05-14 DIAGNOSIS — M79609 Pain in unspecified limb: Secondary | ICD-10-CM | POA: Insufficient documentation

## 2011-05-14 DIAGNOSIS — R42 Dizziness and giddiness: Secondary | ICD-10-CM | POA: Insufficient documentation

## 2011-05-14 DIAGNOSIS — R054 Cough syncope: Secondary | ICD-10-CM

## 2011-05-14 LAB — URINALYSIS, ROUTINE W REFLEX MICROSCOPIC
Bilirubin Urine: NEGATIVE
Glucose, UA: NEGATIVE mg/dL
Hgb urine dipstick: NEGATIVE
Ketones, ur: NEGATIVE mg/dL
Leukocytes, UA: NEGATIVE
Nitrite: NEGATIVE
Protein, ur: NEGATIVE mg/dL
Specific Gravity, Urine: 1.014 (ref 1.005–1.030)
Urobilinogen, UA: 1 mg/dL (ref 0.0–1.0)
pH: 6.5 (ref 5.0–8.0)

## 2011-05-14 LAB — DIFFERENTIAL
Basophils Absolute: 0 10*3/uL (ref 0.0–0.1)
Basophils Relative: 0 % (ref 0–1)
Eosinophils Absolute: 0.1 10*3/uL (ref 0.0–0.7)
Eosinophils Relative: 2 % (ref 0–5)
Lymphocytes Relative: 37 % (ref 12–46)
Lymphs Abs: 2.2 10*3/uL (ref 0.7–4.0)
Monocytes Absolute: 0.6 10*3/uL (ref 0.1–1.0)
Monocytes Relative: 9 % (ref 3–12)
Neutro Abs: 3.1 10*3/uL (ref 1.7–7.7)
Neutrophils Relative %: 52 % (ref 43–77)

## 2011-05-14 LAB — POCT I-STAT, CHEM 8
BUN: 17 mg/dL (ref 6–23)
Calcium, Ion: 1.17 mmol/L (ref 1.12–1.32)
Chloride: 108 mEq/L (ref 96–112)
Creatinine, Ser: 0.6 mg/dL (ref 0.50–1.10)
Glucose, Bld: 87 mg/dL (ref 70–99)
HCT: 39 % (ref 36.0–46.0)
Hemoglobin: 13.3 g/dL (ref 12.0–15.0)
Potassium: 3.9 mEq/L (ref 3.5–5.1)
Sodium: 142 mEq/L (ref 135–145)
TCO2: 26 mmol/L (ref 0–100)

## 2011-05-14 LAB — CBC
HCT: 40.3 % (ref 36.0–46.0)
Hemoglobin: 13.2 g/dL (ref 12.0–15.0)
MCH: 28 pg (ref 26.0–34.0)
MCHC: 32.8 g/dL (ref 30.0–36.0)
MCV: 85.4 fL (ref 78.0–100.0)
Platelets: 227 10*3/uL (ref 150–400)
RBC: 4.72 MIL/uL (ref 3.87–5.11)
RDW: 13.3 % (ref 11.5–15.5)
WBC: 6 10*3/uL (ref 4.0–10.5)

## 2011-05-14 MED ORDER — DEXAMETHASONE SODIUM PHOSPHATE 10 MG/ML IJ SOLN
10.0000 mg | Freq: Once | INTRAMUSCULAR | Status: AC
  Administered 2011-05-14: 10 mg via INTRAMUSCULAR
  Filled 2011-05-14: qty 1

## 2011-05-14 MED ORDER — GI COCKTAIL ~~LOC~~
30.0000 mL | Freq: Once | ORAL | Status: AC
  Administered 2011-05-15: 30 mL via ORAL
  Filled 2011-05-14: qty 30

## 2011-05-14 MED ORDER — KETOROLAC TROMETHAMINE 60 MG/2ML IM SOLN
60.0000 mg | Freq: Once | INTRAMUSCULAR | Status: AC
  Administered 2011-05-14: 60 mg via INTRAMUSCULAR
  Filled 2011-05-14: qty 2

## 2011-05-14 NOTE — ED Notes (Signed)
Nurse First Note: Pt presents to [facility] after a single episode of CP earlier today with sob and wheezing. Hx asthma. Used inhaler x 3 times. Became lightheaded and had a syncopal episode. Does NOT remember falling and states that she woke up on the floor. Pt c/o lower back pain, left leg pain and some slight dizziness. AAOx3. NAD. Vitals: 97.7, 170/102, 84, 16, 100%RA. Transported pt to triage, EKG room. Notified Jon, RN of patient's arrival and condition.

## 2011-05-14 NOTE — ED Notes (Signed)
C/o sob, CP "from coughing", HA & syncope. "Has been taking both tylenol & ibuprofen max dose for awhile". Onset of sx around 1400 today. Passed out at ~1630. (denies: fever since Tuesday or vd). Some nausea alert, NAd calm interactive, speaking in clear complete sentences, no coughing noted, resps e/u, LS CTA. Also reports low back pain & "pinched nerve with radiation down L leg" from fall when she passed out, or while getting up from fall. Denies other pains or injuries.

## 2011-05-14 NOTE — ED Provider Notes (Signed)
History     CSN: [id number]  Arrival date & time 05/14/11  2037   First MD Initiated Contact with Patient 05/14/11 2208     10:38 PM HPI Patient reports she was recently diagnosed with influenza. States she has gradually improved over the last week but continues to have symptoms of cough. Reports today she was coughing severely despite using her albuterol inhaler several times. States suddenly she became extremely dizzy and felt or saw fall to the ground. Reports loss of consciousness for unknown amount of time. Denies fever, shortness of breath, chest pain, numbness, tingling, weakness. States after she became conscious she discovered that she has low back pain. Describes it as similar to her prior pinched nerve pain. States pain radiates down left leg. Reports using 2 Naprosyn without relief. Denies incontinence, saddle anesthesias, perineal numbness. Patient is a 45 y.o. female presenting with syncope and back pain. The history is provided by the patient.  Loss of Consciousness This is a new problem. The current episode started today. The problem has been resolved. Associated symptoms include congestion, coughing, fatigue and headaches. Pertinent negatives include no abdominal pain, chest pain, chills, fever, nausea, neck pain, numbness, sore throat, vomiting or weakness. The symptoms are aggravated by coughing. She has tried nothing for the symptoms.  Back Pain  This is a new problem. The current episode started 3 to 5 hours ago. The problem occurs constantly. The problem has not changed since onset.The pain is associated with falling. The pain is present in the lumbar spine. The quality of the pain is described as stabbing and shooting. The pain radiates to the left thigh and left knee. The pain is severe. The symptoms are aggravated by twisting and certain positions. Associated symptoms include headaches, leg pain and paresthesias. Pertinent negatives include no chest pain, no fever, no  numbness, no abdominal pain, no bowel incontinence, no perianal numbness, no bladder incontinence, no dysuria, no pelvic pain, no paresis, no tingling and no weakness. She has tried NSAIDs for the symptoms. The treatment provided no relief.    Past Medical History  Diagnosis Date  . Asthma   . GERD (gastroesophageal reflux disease)   . Grave's disease     Past Surgical History  Procedure Date  . Abdominal hysterectomy     partial  . Gastric bypass     Family History  Problem Relation Age of Onset  . Hypertension Mother   . Hypertension Sister   . Diabetes Other   . Hypertension Other     History  Substance Use Topics  . Smoking status: Never Smoker   . Smokeless tobacco: Not on file  . Alcohol Use: No    OB History    Grav Para Term Preterm Abortions TAB SAB Ect Mult Living                  Review of Systems  Constitutional: Positive for fatigue. Negative for fever and chills.  HENT: Positive for congestion. Negative for sore throat and neck pain.   Respiratory: Positive for cough.   Cardiovascular: Positive for syncope. Negative for chest pain.  Gastrointestinal: Negative for nausea, vomiting, abdominal pain and bowel incontinence.  Genitourinary: Negative for bladder incontinence, dysuria and pelvic pain.  Musculoskeletal: Positive for back pain.  Neurological: Positive for dizziness, syncope, headaches and paresthesias. Negative for tingling, weakness and numbness.  All other systems reviewed and are negative.    Allergies  Bee; Mushroom ext cmplx(shiitake-reishi-mait); Shellfish allergy; and Hydrocodone-acetaminophen  Home Medications  Current Outpatient Rx  Name Route Sig Dispense Refill  . CLONAZEPAM 1 MG PO TABS Oral Take 1 mg by mouth at bedtime as needed. For anxiety.    Marland Kitchen EPINEPHRINE 0.3 MG/0.3ML IJ DEVI Intramuscular Inject 0.3 mg into the muscle as needed. For allergic reaction to stinging insects, mushrooms, and shellfish     . ESTRADIOL 0.5  MG PO TABS Oral Take 0.5 mg by mouth daily.      . MOMETASONE FURO-FORMOTEROL FUM 100-5 MCG/ACT IN AERO Inhalation Inhale 2 puffs into the lungs 2 (two) times daily.      Marland Kitchen RANITIDINE HCL 150 MG PO TABS Oral Take 150 mg by mouth 2 (two) times daily.      Marland Kitchen ZOLPIDEM TARTRATE 10 MG PO TABS Oral Take 10 mg by mouth at bedtime as needed. For sleep.       BP 170/102  Pulse 84  Temp 97.7 F (36.5 C)  Resp 16  SpO2 100%  Physical Exam  Vitals reviewed. Constitutional: She is oriented to person, place, and time. Vital signs are normal. She appears well-developed and well-nourished.  HENT:  Head: Normocephalic and atraumatic.  Right Ear: External ear normal.  Left Ear: External ear normal.  Nose: Nose normal.  Mouth/Throat: Oropharynx is clear and moist. No oropharyngeal exudate.  Eyes: Conjunctivae and EOM are normal. Pupils are equal, round, and reactive to light.  Neck: Normal range of motion. Neck supple. No tracheal deviation present. No thyromegaly present.  Cardiovascular: Normal rate, regular rhythm and normal heart sounds.  Exam reveals no friction rub.   No murmur heard. Pulmonary/Chest: Effort normal and breath sounds normal. She has no decreased breath sounds. She has no wheezes. She has no rhonchi. She has no rales. She exhibits no tenderness.  Abdominal: Soft. Bowel sounds are normal. She exhibits no distension and no mass. There is no tenderness. There is no rebound and no guarding.  Musculoskeletal:       Lumbar back: She exhibits decreased range of motion, tenderness, bony tenderness, swelling, pain and spasm. She exhibits no edema.       Back:  Neurological: She is alert and oriented to person, place, and time. No cranial nerve deficit or sensory deficit. Coordination normal.  Skin: Skin is warm and dry. No rash noted. No erythema. No pallor.    ED Course  Procedures  Results for orders placed during the hospital encounter of 05/14/11  CBC      Component Value Range     WBC 6.0  4.0 - 10.5 (K/uL)   RBC 4.72  3.87 - 5.11 (MIL/uL)   Hemoglobin 13.2  12.0 - 15.0 (g/dL)   HCT 11.9  14.7 - 82.9 (%)   MCV 85.4  78.0 - 100.0 (fL)   MCH 28.0  26.0 - 34.0 (pg)   MCHC 32.8  30.0 - 36.0 (g/dL)   RDW 56.2  13.0 - 86.5 (%)   Platelets 227  150 - 400 (K/uL)  DIFFERENTIAL      Component Value Range   Neutrophils Relative 52  43 - 77 (%)   Neutro Abs 3.1  1.7 - 7.7 (K/uL)   Lymphocytes Relative 37  12 - 46 (%)   Lymphs Abs 2.2  0.7 - 4.0 (K/uL)   Monocytes Relative 9  3 - 12 (%)   Monocytes Absolute 0.6  0.1 - 1.0 (K/uL)   Eosinophils Relative 2  0 - 5 (%)   Eosinophils Absolute 0.1  0.0 - 0.7 (K/uL)  Basophils Relative 0  0 - 1 (%)   Basophils Absolute 0.0  0.0 - 0.1 (K/uL)  URINALYSIS, ROUTINE W REFLEX MICROSCOPIC      Component Value Range   Color, Urine YELLOW  YELLOW    APPearance CLEAR  CLEAR    Specific Gravity, Urine 1.014  1.005 - 1.030    pH 6.5  5.0 - 8.0    Glucose, UA NEGATIVE  NEGATIVE (mg/dL)   Hgb urine dipstick NEGATIVE  NEGATIVE    Bilirubin Urine NEGATIVE  NEGATIVE    Ketones, ur NEGATIVE  NEGATIVE (mg/dL)   Protein, ur NEGATIVE  NEGATIVE (mg/dL)   Urobilinogen, UA 1.0  0.0 - 1.0 (mg/dL)   Nitrite NEGATIVE  NEGATIVE    Leukocytes, UA NEGATIVE  NEGATIVE   POCT I-STAT, CHEM 8      Component Value Range   Sodium 142  135 - 145 (mEq/L)   Potassium 3.9  3.5 - 5.1 (mEq/L)   Chloride 108  96 - 112 (mEq/L)   BUN 17  6 - 23 (mg/dL)   Creatinine, Ser 2.13  0.50 - 1.10 (mg/dL)   Glucose, Bld 87  70 - 99 (mg/dL)   Calcium, Ion 0.86  5.78 - 1.32 (mmol/L)   TCO2 26  0 - 100 (mmol/L)   Hemoglobin 13.3  12.0 - 15.0 (g/dL)   HCT 46.9  62.9 - 52.8 (%)   Dg Chest 2 View  05/14/2011  *RADIOLOGY REPORT*  Clinical Data: Syncope, shortness of breath and lower back pain; history of asthma.  CHEST - 2 VIEW  Comparison: Chest radiograph performed 04/27/2011  Findings: The lungs are mildly hypoexpanded but appear grossly clear.  There is no evidence of  focal opacification, pleural effusion or pneumothorax.  The heart is borderline normal in size; the mediastinal contour is within normal limits.  No acute osseous abnormalities are seen.  IMPRESSION: Mildly hypoexpanded but grossly clear lungs.  Original Report Authenticated By: Tonia Ghent, M.D.   No diagnosis found.    MDM  11:32 PM Patient reports she is having heartburn pain after eating crackers and peanut butter will get a GI cocktail. Pending lumbar spine x-ray. Normal evaluation. According to history and presentation, patient likely experienced cough syncope. Reports back pain is improved after receiving Toradol and Decadron.    12:47 AM Treat for back pain with muscle relaxers, anti-inflammatory medication and analgesics. Patient agrees with plan and is ready for discharge. Plus a prescription for Nexium and anti-inflammatory medication causes heartburn.  Thomasene Lot, [place] 05/15/11 [phone]

## 2011-05-14 NOTE — ED Notes (Signed)
Given graham crackers and ginger ale now.

## 2011-05-14 NOTE — ED Notes (Signed)
Labs drawn and urine to lab.

## 2011-05-15 ENCOUNTER — Emergency Department (HOSPITAL_COMMUNITY): Payer: Medicaid Other

## 2011-05-15 MED ORDER — OXYCODONE-ACETAMINOPHEN 5-325 MG PO TABS: 1.0000 | ORAL_TABLET | ORAL | Status: AC | PRN

## 2011-05-15 MED ORDER — NAPROXEN 500 MG PO TABS: 500.0000 mg | ORAL_TABLET | Freq: Two times a day (BID) | ORAL | Status: DC

## 2011-05-15 MED ORDER — METHOCARBAMOL 500 MG PO TABS: 500.0000 mg | ORAL_TABLET | Freq: Two times a day (BID) | ORAL | Status: AC

## 2011-05-15 MED ORDER — PREDNISONE (PAK) 10 MG PO TABS: ORAL_TABLET | ORAL | Status: AC

## 2011-05-16 NOTE — ED Provider Notes (Signed)
Medical screening examination/treatment/procedure(s) were performed by non-physician practitioner and as supervising physician I was immediately available for consultation/collaboration.   Oda Lansdowne A Minie Roadcap, MD 05/16/11 0046 

## 2011-05-29 ENCOUNTER — Other Ambulatory Visit: Payer: Self-pay | Admitting: Women's Health

## 2011-05-31 NOTE — Telephone Encounter (Signed)
It will be two years this March since patient's last office visit/CE.  I, also, noticed she had CT of head and was in the emergency room near the end of Dec.  I denied refill and let the pharmacy know by phone that she needs office visit.

## 2011-06-12 ENCOUNTER — Other Ambulatory Visit: Payer: Self-pay | Admitting: Women's Health

## 2011-06-21 ENCOUNTER — Other Ambulatory Visit: Payer: Self-pay | Admitting: *Deleted

## 2011-06-21 NOTE — Telephone Encounter (Signed)
Please see the below. 

## 2011-06-21 NOTE — Telephone Encounter (Signed)
Pt called wanting refill on Ambien  10 mg , pt tired to call pharmacy to get refill, but they told her office denied rx cause overdue for annual. Pt said that she has been to the free pap clinic and had pap done already. She has medicaid. She also wants to talk to you about a private matter. Call at [phone] (h) or [phone] (c)

## 2011-06-21 NOTE — Telephone Encounter (Signed)
Recently received Medicaid, in the process of seeing the Medicaid Dr.Was seen at the Pap clinic on 04/05/11 did have a normal Pap a normal exam. Requesting Ambien 10 at bedtime when necessary #30 no refills will call in.

## 2011-06-23 ENCOUNTER — Telehealth: Payer: Self-pay | Admitting: *Deleted

## 2011-06-23 NOTE — Telephone Encounter (Signed)
Patient was originally prescribed Zolpidem Tartrate 10mg  #30 by Maryelizabeth Rowan NP.  [facility] contacted [name] to tell us that it required prior auth.  Called patient to confirm that maybe she was filling early.  She said the last fill she had was in November 2012.  Called Medicaid and they said she had filled same med on 06/10/11 @ [facility] on [address].  Called and confirmed that the patient had picked up the prescription. Patient called back in the meantime very lost and distraught and said that she had refills but had no idea where she had put them.  Called original [facility] and cancelled rx.  Maryelizabeth Rowan NP was notified of entire situation.

## 2011-07-12 DIAGNOSIS — F331 Major depressive disorder, recurrent, moderate: Secondary | ICD-10-CM

## 2011-07-12 DIAGNOSIS — R413 Other amnesia: Secondary | ICD-10-CM

## 2011-08-11 DIAGNOSIS — R41840 Attention and concentration deficit: Secondary | ICD-10-CM

## 2011-08-11 DIAGNOSIS — F331 Major depressive disorder, recurrent, moderate: Secondary | ICD-10-CM

## 2011-08-15 ENCOUNTER — Encounter (HOSPITAL_COMMUNITY): Payer: Self-pay

## 2011-08-15 ENCOUNTER — Emergency Department (HOSPITAL_COMMUNITY)
Admission: EM | Admit: 2011-08-15 | Discharge: 2011-08-15 | Disposition: A | Discharge status: Home / Self Care | Payer: Medicaid Other | Attending: Emergency Medicine | Admitting: Emergency Medicine

## 2011-08-15 DIAGNOSIS — Z79899 Other long term (current) drug therapy: Secondary | ICD-10-CM | POA: Insufficient documentation

## 2011-08-15 DIAGNOSIS — K219 Gastro-esophageal reflux disease without esophagitis: Secondary | ICD-10-CM | POA: Insufficient documentation

## 2011-08-15 DIAGNOSIS — M549 Dorsalgia, unspecified: Secondary | ICD-10-CM

## 2011-08-15 DIAGNOSIS — M545 Low back pain, unspecified: Secondary | ICD-10-CM | POA: Insufficient documentation

## 2011-08-15 DIAGNOSIS — R42 Dizziness and giddiness: Secondary | ICD-10-CM | POA: Insufficient documentation

## 2011-08-15 DIAGNOSIS — Z9889 Other specified postprocedural states: Secondary | ICD-10-CM | POA: Insufficient documentation

## 2011-08-15 DIAGNOSIS — J45909 Unspecified asthma, uncomplicated: Secondary | ICD-10-CM | POA: Insufficient documentation

## 2011-08-15 DIAGNOSIS — E0500 Thyrotoxicosis with diffuse goiter without thyrotoxic crisis or storm: Secondary | ICD-10-CM | POA: Insufficient documentation

## 2011-08-15 LAB — URINALYSIS, ROUTINE W REFLEX MICROSCOPIC
Bilirubin Urine: NEGATIVE
Glucose, UA: NEGATIVE mg/dL
Hgb urine dipstick: NEGATIVE
Ketones, ur: NEGATIVE mg/dL
Leukocytes, UA: NEGATIVE
Nitrite: NEGATIVE
Protein, ur: NEGATIVE mg/dL
Specific Gravity, Urine: 1.026 (ref 1.005–1.030)
Urobilinogen, UA: 0.2 mg/dL (ref 0.0–1.0)
pH: 6 (ref 5.0–8.0)

## 2011-08-15 MED ORDER — OXYCODONE-ACETAMINOPHEN 5-325 MG PO TABS: 1.0000 | ORAL_TABLET | Freq: Three times a day (TID) | ORAL | Status: AC | PRN

## 2011-08-15 MED ORDER — HYDROMORPHONE HCL PF 2 MG/ML IJ SOLN
2.0000 mg | Freq: Once | INTRAMUSCULAR | Status: AC
  Administered 2011-08-15: 2 mg via INTRAMUSCULAR
  Filled 2011-08-15: qty 1

## 2011-08-15 MED ORDER — CYCLOBENZAPRINE HCL 10 MG PO TABS
5.0000 mg | ORAL_TABLET | Freq: Once | ORAL | Status: AC
  Administered 2011-08-15: 5 mg via ORAL
  Filled 2011-08-15: qty 1

## 2011-08-15 MED ORDER — DIPHENHYDRAMINE HCL 50 MG/ML IJ SOLN
25.0000 mg | Freq: Once | INTRAMUSCULAR | Status: AC
  Administered 2011-08-15: 25 mg via INTRAVENOUS
  Filled 2011-08-15: qty 1

## 2011-08-15 MED ORDER — DIPHENHYDRAMINE HCL 50 MG/ML IJ SOLN
12.5000 mg | Freq: Once | INTRAMUSCULAR | Status: AC
  Administered 2011-08-15: 12.5 mg via INTRAVENOUS
  Filled 2011-08-15: qty 1

## 2011-08-15 MED ORDER — MORPHINE SULFATE 4 MG/ML IJ SOLN
4.0000 mg | Freq: Once | INTRAMUSCULAR | Status: AC
  Administered 2011-08-15: 4 mg via INTRAVENOUS
  Filled 2011-08-15: qty 1

## 2011-08-15 MED ORDER — CYCLOBENZAPRINE HCL 10 MG PO TABS: 10.0000 mg | ORAL_TABLET | Freq: Two times a day (BID) | ORAL | Status: AC | PRN

## 2011-08-15 NOTE — ED Provider Notes (Signed)
Medical screening examination/treatment/procedure(s) were performed by non-physician practitioner and as supervising physician I was immediately available for consultation/collaboration.  Myisha Pickerel T Berlyn Malina, MD 08/15/11 2324 

## 2011-08-15 NOTE — ED Provider Notes (Signed)
History     CSN: [id number]  Arrival date & time 08/15/11  1518   First MD Initiated Contact with Patient 08/15/11 1818      Chief Complaint  Patient presents with  . Back Pain    (Consider location/radiation/quality/duration/timing/severity/associated sxs/prior treatment) HPI Comments: Patient history lower back pain states that she had a flare beginning this morning when she bent over to put her socks on. Patient has not been comfortable since. She states that she took two naproxen without relief. Patient walked into the emergency department. She was initially in triage room and she states that she was uncomfortable sitting so she tried to stand up without assistance. When she did she became lightheaded and lowered herself to the floor. Patient states that her pain was worse. She denies any injury from this event. She denies unexplained fevers, weight loss, urinary or bowel incontinence, or IV drug use, weakness/numbness/tingling in her lower extremities. She states she has been urinating more without dysuria and wonders if she is getting a kidney infection.   Patient is a 46 y.o. female presenting with back pain. The history is provided by the patient.  Back Pain  This is a recurrent problem. The current episode started 6 to 12 hours ago. The problem occurs constantly. The problem has not changed since onset.Associated with: Bending over. The pain is present in the lumbar spine. The quality of the pain is described as shooting. The pain radiates to the left thigh and right thigh. The patient is experiencing no pain. The symptoms are aggravated by bending and certain positions. Pertinent negatives include no chest pain, no fever, no numbness, no weight loss, no headaches, no abdominal pain, no bowel incontinence, no perianal numbness, no bladder incontinence, no dysuria, no paresthesias and no weakness. She has tried NSAIDs for the symptoms. The treatment provided no relief.    Past Medical  History  Diagnosis Date  . Asthma   . GERD (gastroesophageal reflux disease)   . Grave's disease     Past Surgical History  Procedure Date  . Abdominal hysterectomy     partial  . Gastric bypass     Family History  Problem Relation Age of Onset  . Hypertension Mother   . Hypertension Sister   . Diabetes Other   . Hypertension Other     History  Substance Use Topics  . Smoking status: Never Smoker   . Smokeless tobacco: Not on file  . Alcohol Use: No    OB History    Grav Para Term Preterm Abortions TAB SAB Ect Mult Living                  Review of Systems  Constitutional: Negative for fever, weight loss and unexpected weight change.  HENT: Negative for sore throat and rhinorrhea.   Eyes: Negative for redness.  Respiratory: Negative for cough.   Cardiovascular: Negative for chest pain.  Gastrointestinal: Negative for nausea, vomiting, abdominal pain, diarrhea, constipation and bowel incontinence.       Negative for fecal incontinence.   Genitourinary: Negative for bladder incontinence, dysuria and hematuria.       Negative for urinary incontinence or retention.  Musculoskeletal: Positive for back pain. Negative for myalgias.  Skin: Negative for rash.  Neurological: Negative for weakness, numbness, headaches and paresthesias.       Denies saddle paresthesias.    Allergies  Bee; Mushroom ext cmplx(shiitake-reishi-mait); Shellfish allergy; and Hydrocodone-acetaminophen  Home Medications   Current Outpatient Rx  Name Route  Sig Dispense Refill  . CALCIUM CARBONATE ANTACID 500 MG PO CHEW Oral Chew 3 tablets by mouth daily.    Marland Kitchen EPINEPHRINE 0.3 MG/0.3ML IJ DEVI Intramuscular Inject 0.3 mg into the muscle as needed. For allergic reaction to stinging insects, mushrooms, and shellfish     . ESTRADIOL 0.5 MG PO TABS Oral Take 0.5 mg by mouth daily.      . IBUPROFEN-DIPHENHYDRAMINE CIT 200-38 MG PO TABS Oral Take 3 capsules by mouth at bedtime.    . MOMETASONE  FURO-FORMOTEROL FUM 100-5 MCG/ACT IN AERO Inhalation Inhale 2 puffs into the lungs 2 (two) times daily.      Marland Kitchen NAPROXEN 500 MG PO TABS Oral Take 1 tablet (500 mg total) by mouth 2 (two) times daily. 30 tablet 0  . PROMETHAZINE HCL 25 MG PO TABS Oral Take 25 mg by mouth every 6 (six) hours as needed. For nausea    . TOPIRAMATE 25 MG PO TABS Oral Take 100 mg by mouth 2 (two) times daily.    . AMOXICILLIN-POT CLAVULANATE 875-125 MG PO TABS Oral Take 1 tablet by mouth 2 (two) times daily.      BP 123/48  Pulse 67  Temp(Src) 99.2 F (37.3 C) (Oral)  Resp 20  SpO2 100%  Physical Exam  Nursing note and vitals reviewed. Constitutional: She is oriented to person, place, and time. She appears well-developed and well-nourished.  HENT:  Head: Normocephalic and atraumatic.  Eyes: Conjunctivae are normal.  Neck: Normal range of motion. Neck supple.  Pulmonary/Chest: Effort normal.  Abdominal: Soft. There is no tenderness. There is no CVA tenderness.  Musculoskeletal: Normal range of motion.       There is no tenderness to palpation over cervical/thoracic/lumbar/sacral spine. Tenderness to lumbar paraspinal muscles, L>R. No step-off noted with palpation of spine.   Neurological: She is alert and oriented to person, place, and time. She has normal strength and normal reflexes. No sensory deficit.       5/5 strength in entire lower extremities bilaterally. No sensation deficit.   Skin: Skin is warm and dry. No rash noted.  Psychiatric: She has a normal mood and affect.    ED Course  Procedures (including critical care time)  Labs Reviewed  URINALYSIS, ROUTINE W REFLEX MICROSCOPIC - Abnormal; Notable for the following:    APPearance CLOUDY (*)    All other components within normal limits   No results found.   1. Back pain     6:45 PM Patient seen and examined. Work-up initiated. Medications ordered. I spoke with the PA who saw patient in fast track. Patient was on floor having back pain.  She was placed on a stretcher and moved to her current room.   Vital signs reviewed and are as follows: Filed Vitals:   08/15/11 1732  BP: 123/48  Pulse: 67  Temp: 99.2 F (37.3 C)  Resp: 20   8:45 PM Patient with some mild relief, she is requesting additional pain medication. She is requesting discharge to home.   Patient was counseled on back pain precautions and told to do activity as tolerated but do not lift, push, or pull heavy objects more than 10 pounds for the next week.  Patient counseled to use ice or heat on back for no longer than 15 minutes every hour.   Patient prescribed muscle relaxer and counseled on proper use of muscle relaxant medication.    Patient prescribed narcotic pain medicine and counseled on proper use of narcotic pain medications. Counseled  not to combine this medication with others containing tylenol.   Urged patient not to drink alcohol, drive, or perform any other activities that requires focus while taking either of these medications.  Patient urged to follow-up with PCP if pain does not improve with treatment and rest or if pain becomes recurrent. Urged to return with worsening severe pain, loss of bowel or bladder control, trouble walking.   The patient verbalizes understanding and agrees with the plan.  MDM  Patient with back pain. No neurological deficits. Patient is ambulatory. No warning symptoms of back pain including: loss of bowel or bladder control, night sweats, waking from sleep with back pain, unexplained fevers or weight loss, h/o cancer, IVDU, recent trauma. No concern for cauda equina, epidural abscess, or other serious cause of back pain. Conservative measures such as rest, ice/heat and pain medicine indicated with PCP follow-up if no improvement with conservative management.    Renne Crigler, [place] 08/15/11 2125

## 2011-08-15 NOTE — ED Notes (Signed)
Pt found lying on floor from Wheelchair. Unwilling to answer questions, states she tried to get up from wheelchair. Pt screaming in pain. Picked up off floor and placed on stretcher. With all side rails up.

## 2011-08-15 NOTE — ED Notes (Signed)
Pt states she has been having sciatica type back pain for a little over two years.  Has been having a lot of muscle pain all over to the point where she "can't get up and do anything for two days".  Pt's legs fall asleep.  While answering my questions, the pt asked "where am i?", even though she drove herself to this hospital.  When asked about whether or not she was oriented, she stated that over the past year, she has had memory loss to the point that she has to write down things that she is doing so that she does not forget, pull over on the side of the road to remember where she is going, or turn on the gps to simple locations.  Pt handed me a list of sx to include: "muscle soreness, joint pain, memory loss, ears ringing, morning stiffness, brain fog, double vision, chronic fatigue, can't sleep, numbness, depression, body aches, cold a lot, anxiety, insomnia."

## 2011-08-15 NOTE — Discharge Instructions (Signed)
Please read and follow all provided instructions.  Your diagnoses today include:  1. Back pain     Tests performed today include:  Urine test to look for infection  Vital signs - see below for your results today  Medications prescribed:   Percocet (oxycodone/acetaminophen) - narcotic pain medication   You have been prescribed narcotic pain medication such as Vicodin or Percocet: DO NOT drive or perform any activities that require you to be awake and alert because this medicine can make you drowsy. BE VERY CAREFUL not to take multiple medicines containing Tylenol (also called acetaminophen). Doing so can lead to an overdose which can damage your liver and cause liver failure and possibly death.    Flexeril (cyclobenzaprine) - muscle relaxer medication  You have been prescribed a muscle relaxer medication such as Robaxin, Flexeril, or Valium: DO NOT drive or perform any activities that require you to be awake and alert because this medicine can make you drowsy.   Take any prescribed medications only as directed.  Home care instructions:   Follow any educational materials contained in this packet  Please rest, use ice or heat on your back for the next several days  Do not lift, push, pull anything more than 10 pounds for the next week  Follow-up instructions: Please follow-up with your primary care provider in the next 1 week for further evaluation of your symptoms. If you do not have a primary care doctor -- see below for referral information.   Return instructions:  SEEK IMMEDIATE MEDICAL ATTENTION IF YOU HAVE:  New numbness, tingling, weakness, or problem with the use of your arms or legs  Severe back pain not relieved with medications  Loss control of your bowels or bladder  Increasing pain in any areas of the body (such as chest or abdominal pain)  Shortness of breath, dizziness, or fainting.   Worsening nausea (feeling sick to your stomach), vomiting, fever, or  sweats  Any other emergent concerns regarding your health   Additional Information:  Your vital signs today were: BP 102/59  Pulse 87  Temp(Src) 97.6 F (36.4 C) (Oral)  Resp 16  SpO2 100% If your blood pressure (BP) was elevated above 135/85 this visit, please have this repeated by your doctor within one month. -------------- No Primary Care Doctor Call [facility]  [phone] Other agencies that provide inexpensive medical care    [facility]  [phone]    [facility]  [phone]    [facility]  [phone]    [facility]  [phone]    [facility]  [phone]    [facility]  [phone] -------------- RESOURCE GUIDE:  Dental Problems  Patients with Medicaid: [facility] [phone]                                            [phone] [address] Phone:  [phone]                                                   Phone:  [phone]  If  unable to pay or uninsured, contact:  [facility] or [facility]. to become qualified for the adult dental clinic.  Chronic Pain Problems Contact [facility]  [phone] Patients need to be referred by their primary care doctor.  Insufficient Money for Medicine Contact [employer]:  call "211" or [facility] [phone].  Psychological Services [facility]  [phone] [facility]  [phone] [facility]   [phone] (emergency services [phone])  Substance Abuse Resources [facility]  [phone] [facility] [phone] [facility] [phone] Floydene Flock [phone] Residential & Outpatient Substance Abuse Program  [phone]  Abuse/Neglect [facility] Child Abuse Hotline 902-728-1067 [facility] Child Abuse Hotline 825-419-3611 (After Hours)  Emergency Shelter [facility]  [phone]  Maternity Homes [facility] [place] [facility] [phone] [place] [facility] [phone]  [facility] Resources  [facility] [place]     [employer]                          [facility] [address] [place]                       [address]      [address]  Blondell Reveal Phone:  [phone]                                   Phone:  [phone]                 Phone:  [phone]  [facility] Phone:  [phone]  [facility] Child Abuse Hotline [phone] [phone] (After Hours)

## 2011-08-15 NOTE — ED Notes (Signed)
Patient here with reported lower back spasm. Reports same in January, denies injury

## 2011-08-15 NOTE — ED Provider Notes (Signed)
Pt was briefly screened by me. Pt was found on the floor of the room When asked pt why she is laying on the floor, she said she could not sit in stretcher anymore. PT states she is unable to get up. She is here for back pain and at present unable to sit or stand up. 3 nurses, tech, and myself, got pt up off the floor into a stretcher. She does not appear to have any injuries from the fall. Pt is in pain screaming, pain medications ordered, will move to the back for further evaluation.   Lottie Mussel, PA 08/15/11 [phone]

## 2011-08-15 NOTE — ED Notes (Signed)
C/o lower abdominal pain that came on suddenly.

## 2011-08-16 NOTE — ED Provider Notes (Signed)
Medical screening examination/treatment/procedure(s) were performed by non-physician practitioner and as supervising physician I was immediately available for consultation/collaboration. Devoria Albe, MD, FACEP   Ward Givens, MD 08/16/11 1049

## 2011-09-28 DIAGNOSIS — I10 Essential (primary) hypertension: Secondary | ICD-10-CM | POA: Insufficient documentation

## 2011-09-28 DIAGNOSIS — J309 Allergic rhinitis, unspecified: Secondary | ICD-10-CM | POA: Insufficient documentation

## 2011-09-28 DIAGNOSIS — R413 Other amnesia: Secondary | ICD-10-CM | POA: Insufficient documentation

## 2011-09-28 DIAGNOSIS — D649 Anemia, unspecified: Secondary | ICD-10-CM | POA: Insufficient documentation

## 2011-11-03 DIAGNOSIS — Z79890 Hormone replacement therapy (postmenopausal): Secondary | ICD-10-CM | POA: Insufficient documentation

## 2011-11-19 ENCOUNTER — Encounter: Payer: Self-pay | Admitting: *Deleted

## 2011-11-19 DIAGNOSIS — E0500 Thyrotoxicosis with diffuse goiter without thyrotoxic crisis or storm: Secondary | ICD-10-CM | POA: Insufficient documentation

## 2011-11-19 DIAGNOSIS — K219 Gastro-esophageal reflux disease without esophagitis: Secondary | ICD-10-CM | POA: Insufficient documentation

## 2011-11-22 ENCOUNTER — Encounter: Payer: Self-pay | Admitting: Women's Health

## 2011-11-22 ENCOUNTER — Ambulatory Visit (INDEPENDENT_AMBULATORY_CARE_PROVIDER_SITE_OTHER): Payer: Medicaid Other | Admitting: Women's Health

## 2011-11-22 VITALS — BP 120/76 | Ht 65.0 in | Wt 202.0 lb

## 2011-11-22 DIAGNOSIS — G8929 Other chronic pain: Secondary | ICD-10-CM | POA: Insufficient documentation

## 2011-11-22 DIAGNOSIS — Z01419 Encounter for gynecological examination (general) (routine) without abnormal findings: Secondary | ICD-10-CM

## 2011-11-22 DIAGNOSIS — F329 Major depressive disorder, single episode, unspecified: Secondary | ICD-10-CM

## 2011-11-22 DIAGNOSIS — F32A Depression, unspecified: Secondary | ICD-10-CM

## 2011-11-22 DIAGNOSIS — Z780 Asymptomatic menopausal state: Secondary | ICD-10-CM

## 2011-11-22 MED ORDER — ESTRADIOL 0.5 MG PO TABS: 0.5000 mg | ORAL_TABLET | Freq: Every day | ORAL | Status: DC

## 2011-11-22 NOTE — Patient Instructions (Addendum)

## 2011-11-22 NOTE — Progress Notes (Signed)
[name] Mar 05, 1966 [id number]    History:    The patient presents for annual exam.  Postmenopausal on Estrace 0.05 daily with good control of menopausal symptoms. LAVH in 05 /DUB. Numerous health problems/complaints of depression, muscle aches/ fatigue, currently being worked up for MS, and is seen at a pain clinic for pain medications. History of normal Paps and mammograms   Past medical history, past surgical history, family history and social history were all reviewed and documented in the EPIC chart. History of gastric bypass surgery in 2009 lost weight initially but gained and now is back losing weight. 80 year old son doing poorly at [facility], daughter Mick Sell has a daughter Serenity, Theodosia Paling had a fractured femur.   ROS:  A  ROS was performed and pertinent positives and negatives are included in the history.  Exam:  Filed Vitals:   11/22/11 1516  BP: 120/76    General appearance:  Normal Head/Neck:  Normal, without cervical or supraclavicular adenopathy. Thyroid:  Symmetrical, normal in size, without palpable masses or nodularity. Respiratory  Effort:  Normal  Auscultation:  Clear without wheezing or rhonchi Cardiovascular  Auscultation:  Regular rate, without rubs, murmurs or gallops  Edema/varicosities:  Not grossly evident Abdominal  Soft,nontender, without masses, guarding or rebound.  Liver/spleen:  No organomegaly noted  Hernia:  None appreciated  Skin  Inspection:  Grossly normal  Palpation:  Grossly normal Neurologic/psychiatric  Orientation:  Normal with appropriate conversation.  Mood/affect:  Normal  Genitourinary    Breasts: Examined lying and sitting.     Right: Without masses, retractions, discharge or axillary adenopathy.     Left: Without masses, retractions, discharge or axillary adenopathy.   Inguinal/mons:  Normal without inguinal adenopathy  External genitalia:  Normal  BUS/Urethra/Skene's glands:  Normal  Bladder:  Normal  Vagina:   Normal  Cervix:  Absent  Uterus:  Absent  Adnexa/parametria:     Rt: Without masses or tenderness.   Lt: Without masses or tenderness.  Anus and perineum: Normal  Digital rectal exam: Normal sphincter tone without palpated masses or tenderness  Assessment/Plan:  46 y.o. DPF G2 P2 for annual exam with numerous health problems.  Postmenopausal on ERT with good symptom relief  Numerous health problems-primary care manages Chronic Pain-pain clinic manages  Plan: Estrace 0.05 daily prescription, proper use, slight risk for blood clots/strokes and breast cancer reviewed. Reviewed best to use shortest amount of time, states has had good relief of hot flushes and would like to continue. SBE's, continue annual mammogram, calcium rich diet, vitamin D 2000 daily encouraged. Reviewed importance of increasing regular exercise and decreasing calories for continued weight loss and health. No Pap, new screening recommendations reviewed. Condoms encouraged if becomes sexually active.   Harrington Challenger [facility], 6:10 PM 11/22/2011

## 2011-12-02 ENCOUNTER — Other Ambulatory Visit: Payer: Self-pay | Admitting: Women's Health

## 2011-12-02 DIAGNOSIS — Z1231 Encounter for screening mammogram for malignant neoplasm of breast: Secondary | ICD-10-CM

## 2011-12-07 ENCOUNTER — Other Ambulatory Visit: Payer: Self-pay | Admitting: Women's Health

## 2011-12-07 NOTE — Telephone Encounter (Signed)
Patient was in 11/22/11 for CE but I did not see mention of this medication.

## 2011-12-08 NOTE — Telephone Encounter (Signed)
Patient informed that [place] received refill request and would prefer if she would contact her pain management specialist or primary care MD for this refill. Best to get her RX's from one provider. Patient was fine with that and said that is what she will do.

## 2011-12-08 NOTE — Telephone Encounter (Signed)
Please call and encourage her to get all her medications through one provider. Ask if she is only used the one prescription and uses rarely we can call in for her.

## 2011-12-08 NOTE — Telephone Encounter (Signed)
[name], You prescribed #30 with one refill last 01/2011.  Do you want me to suggest to patient that you would prefer she get this Rx from one of these other providers she sees?  I am happy to call her and recommend just wanted to be sure that is what you were saying in your note. Thanks. KA

## 2011-12-08 NOTE — Telephone Encounter (Signed)
Please call, this patient is seen by the pain clinic where her medications are administered, also sees primary care doctor for her numerous health issues. I think we only prescribe her Estrogen.

## 2011-12-22 ENCOUNTER — Ambulatory Visit (HOSPITAL_COMMUNITY): Payer: Medicaid Other

## 2011-12-24 ENCOUNTER — Ambulatory Visit (HOSPITAL_COMMUNITY)
Admission: RE | Admit: 2011-12-24 | Discharge: 2011-12-24 | Disposition: A | Discharge status: Home / Self Care | Payer: Medicaid Other | Source: Ambulatory Visit | Attending: Women's Health | Admitting: Women's Health

## 2011-12-24 DIAGNOSIS — Z1231 Encounter for screening mammogram for malignant neoplasm of breast: Secondary | ICD-10-CM

## 2012-01-10 ENCOUNTER — Encounter: Payer: Self-pay | Admitting: Obstetrics and Gynecology

## 2012-03-16 ENCOUNTER — Encounter (HOSPITAL_COMMUNITY): Payer: Self-pay | Admitting: *Deleted

## 2012-03-16 ENCOUNTER — Emergency Department (HOSPITAL_COMMUNITY)
Admission: EM | Admit: 2012-03-16 | Discharge: 2012-03-16 | Disposition: A | Discharge status: Home / Self Care | Payer: Self-pay | Attending: Emergency Medicine | Admitting: Emergency Medicine

## 2012-03-16 DIAGNOSIS — E0500 Thyrotoxicosis with diffuse goiter without thyrotoxic crisis or storm: Secondary | ICD-10-CM | POA: Insufficient documentation

## 2012-03-16 DIAGNOSIS — M79609 Pain in unspecified limb: Secondary | ICD-10-CM | POA: Insufficient documentation

## 2012-03-16 DIAGNOSIS — Z79899 Other long term (current) drug therapy: Secondary | ICD-10-CM | POA: Insufficient documentation

## 2012-03-16 DIAGNOSIS — R300 Dysuria: Secondary | ICD-10-CM | POA: Insufficient documentation

## 2012-03-16 DIAGNOSIS — Z8719 Personal history of other diseases of the digestive system: Secondary | ICD-10-CM | POA: Insufficient documentation

## 2012-03-16 DIAGNOSIS — J45909 Unspecified asthma, uncomplicated: Secondary | ICD-10-CM | POA: Insufficient documentation

## 2012-03-16 DIAGNOSIS — M79606 Pain in leg, unspecified: Secondary | ICD-10-CM

## 2012-03-16 LAB — URINALYSIS, ROUTINE W REFLEX MICROSCOPIC
Bilirubin Urine: NEGATIVE
Glucose, UA: NEGATIVE mg/dL
Hgb urine dipstick: NEGATIVE
Ketones, ur: NEGATIVE mg/dL
Leukocytes, UA: NEGATIVE
Nitrite: NEGATIVE
Protein, ur: NEGATIVE mg/dL
Specific Gravity, Urine: 1.015 (ref 1.005–1.030)
Urobilinogen, UA: 1 mg/dL (ref 0.0–1.0)
pH: 7 (ref 5.0–8.0)

## 2012-03-16 MED ORDER — ENOXAPARIN SODIUM 100 MG/ML ~~LOC~~ SOLN
100.0000 mg | Freq: Once | SUBCUTANEOUS | Status: AC
  Administered 2012-03-16: 100 mg via SUBCUTANEOUS
  Filled 2012-03-16: qty 1

## 2012-03-16 NOTE — ED Provider Notes (Signed)
History  Scribed for Jones Skene, MD, the patient was seen in room TR10C/TR10C. This chart was scribed by Candelaria Stagers. The patient's care started at 7:04 PM   CSN: [id number]  Arrival date & time 03/16/12  1815   First MD Initiated Contact with Patient 03/16/12 1845      Chief Complaint  Patient presents with  . Leg Pain    HPI [name] is a 46 y.o. female who presents to the Emergency Department complaining of left upper lateral leg pain that started 9-10 months ago and became worse three days ago when she experienced a popping feeling.  Pt describes the pain as a burning pain.  She states that the area was blue and purple four days ago which lasted about two days.  Pt was sent by her PCP with concern of a blood clot.  She has also experienced fever, chills, and frequent urination. She has taken oxycodone with some relief.  The pain now is a 8/10.  She has h/o Grave's disease.  Pt reports family h/o blood clots.      Past Medical History  Diagnosis Date  . Asthma   . GERD (gastroesophageal reflux disease)   . Grave's disease     Past Surgical History  Procedure Date  . Abdominal hysterectomy     partial  . Gastric bypass 2008    Family History  Problem Relation Age of Onset  . Hypertension Mother   . Hypertension Sister   . Diabetes Other   . Hypertension Other   . Diabetes Maternal Uncle     History  Substance Use Topics  . Smoking status: Never Smoker   . Smokeless tobacco: Not on file  . Alcohol Use: No    OB History    Grav Para Term Preterm Abortions TAB SAB Ect Mult Living   2 2        2       Review of Systems A complete 10 system review of systems was obtained and all systems are negative except as noted in the HPI and PMH.   Allergies  Mushroom ext cmplx(shiitake-reishi-mait); Nutritional supplements; Shellfish allergy; and Hydrocodone-acetaminophen  Home Medications   Current Outpatient Rx  Name Route Sig Dispense Refill  .  AMOXICILLIN-POT CLAVULANATE 875-125 MG PO TABS Oral Take 1 tablet by mouth 2 (two) times daily.    Marland Kitchen CALCIUM CARBONATE ANTACID 500 MG PO CHEW Oral Chew 3 tablets by mouth daily.    Marland Kitchen CETIRIZINE HCL 10 MG PO TABS Oral Take 10 mg by mouth daily.    Marland Kitchen EPINEPHRINE 0.3 MG/0.3ML IJ DEVI Intramuscular Inject 0.3 mg into the muscle as needed. For allergic reaction to stinging insects, mushrooms, and shellfish     . ESCITALOPRAM OXALATE 10 MG PO TABS Oral Take 10 mg by mouth daily.    Marland Kitchen ESTRADIOL 0.5 MG PO TABS Oral Take 1 tablet (0.5 mg total) by mouth daily. 90 tablet 4  . IBUPROFEN-DIPHENHYDRAMINE CIT 200-38 MG PO TABS Oral Take 3 capsules by mouth at bedtime.    . MOMETASONE FURO-FORMOTEROL FUM 100-5 MCG/ACT IN AERO Inhalation Inhale 2 puffs into the lungs 2 (two) times daily.      Marland Kitchen NAPROXEN 500 MG PO TABS Oral Take 1 tablet (500 mg total) by mouth 2 (two) times daily. 30 tablet 0  . OXYCODONE-ACETAMINOPHEN 5-325 MG PO TABS Oral Take 1 tablet by mouth 3 (three) times daily.    Marland Kitchen PROMETHAZINE HCL 25 MG PO TABS Oral Take  25 mg by mouth every 6 (six) hours as needed. For nausea    . TIZANIDINE HCL 4 MG PO CAPS Oral Take 4 mg by mouth 3 (three) times daily.    . TOPIRAMATE 25 MG PO TABS Oral Take 100 mg by mouth 2 (two) times daily.    . TRAZODONE HCL 50 MG PO TABS Oral Take 50 mg by mouth at bedtime.    Marland Kitchen ZOLPIDEM TARTRATE 10 MG PO TABS Oral Take 1 tablet (10 mg total) by mouth at bedtime as needed for sleep. 15 tablet 0    BP 121/68  Pulse 92  Temp 97.5 F (36.4 C) (Oral)  Resp 18  SpO2 100%  Physical Exam  Nursing notes reviewed.  Electronic medical record reviewed. VITAL SIGNS:   Filed Vitals:   03/16/12 1819  BP: 121/68  Pulse: 92  Temp: 97.5 F (36.4 C)  TempSrc: Oral  Resp: 18  SpO2: 100%   CONSTITUTIONAL: Awake, oriented, appears non-toxic HENT: Atraumatic, normocephalic, oral mucosa pink and moist, airway patent. Nares patent without drainage. External ears normal. EYES:  Conjunctiva clear, EOMI, PERRLA NECK: Trachea midline, non-tender, supple CARDIOVASCULAR: Normal heart rate, Normal rhythm, No murmurs, rubs, gallops PULMONARY/CHEST: Clear to auscultation, no rhonchi, wheezes, or rales. Symmetrical breath sounds. Non-tender. ABDOMINAL: Non-distended, soft, non-tender - no rebound or guarding.  BS normal. NEUROLOGIC: Non-focal, moving all four extremities, no gross sensory or motor deficits. EXTREMITIES: No clubbing, cyanosis, swelling or edema. No discoloration. Lateral thigh anterior lateral thighs was very tender to palpation. Hyperesthesia to light touch. SKIN: Warm, Dry, No erythema, No rash  ED Course  Procedures   DIAGNOSTIC STUDIES:   COORDINATION OF CARE:  6:12PM Ordered: Urinalysis, Routine w reflex microscopic.     Labs Reviewed  URINALYSIS, ROUTINE W REFLEX MICROSCOPIC   No results found.   No diagnosis found.    MDM  [name] is a 46 y.o. female presents from PCP office to rule out DVT - patient's pain is a burning sensation and she seems to have a hyperesthetic anterolateral and lateral aspect of the left leg. She says it was bruised, purple and blue 2 days ago however is completely uncolored at this time. There is no mass there. I'm suspicious of a urinary tract infection by her history however I am have a low pretest probability for DVT in lower extremities. Likewise she is not complaining of shortness of breath, pulse is 92, no pleuritic chest pain - I seriously doubt PE also. She is PERC negative.  Urinalysis is within normal limits.  Because it is after 7:00, we'll have the patient return tomorrow morning for a Doppler ultrasound of her lower extremities. Patient has pain medicine at home. We'll start her Lovenox tonight.  She understands she needs to return tomorrow morning for the test. I explained the diagnosis and have given explicit precautions to return to the ER including worsening leg pain, shortness of breath  or chest pain or any other new or worsening symptoms. The patient understands and accepts the medical plan as it's been dictated and I have answered their questions. Discharge instructions concerning home care and prescriptions have been given.  The patient is STABLE and is discharged to home in good condition.    I personally performed the services described in this documentation, which was scribed in my presence. The recorded information has been reviewed and considered. Jones Skene, M.D.          Jones Skene, MD 03/16/12 2113

## 2012-03-16 NOTE — ED Notes (Addendum)
Patient sent here from MD's office to rule out dvt of the left leg.  Patient had SOB earlier and used her inhaler a few times and it helped.  Patient has been experiencing since three days ago.  She felt a pop and the pain started radiating up her left leg for about three days and it's painful to walk

## 2012-03-16 NOTE — ED Notes (Signed)
This RN to bedside to discharge the patient to home.  Patient expressed disagreement with plan.  Patient requesting to speak with provider and requesting to stay overnight. NP, Dondra Spry, informed of patient's concerns.

## 2012-03-16 NOTE — ED Notes (Signed)
MD at bedside. 

## 2012-03-17 ENCOUNTER — Ambulatory Visit (HOSPITAL_COMMUNITY)
Admission: RE | Admit: 2012-03-17 | Discharge: 2012-03-17 | Disposition: A | Discharge status: Home / Self Care | Payer: Self-pay | Source: Ambulatory Visit | Attending: Emergency Medicine | Admitting: Emergency Medicine

## 2012-03-17 DIAGNOSIS — Z8249 Family history of ischemic heart disease and other diseases of the circulatory system: Secondary | ICD-10-CM | POA: Insufficient documentation

## 2012-03-17 DIAGNOSIS — M79609 Pain in unspecified limb: Secondary | ICD-10-CM

## 2012-03-17 DIAGNOSIS — G8929 Other chronic pain: Secondary | ICD-10-CM

## 2012-03-17 DIAGNOSIS — M7989 Other specified soft tissue disorders: Secondary | ICD-10-CM | POA: Insufficient documentation

## 2012-03-17 NOTE — Progress Notes (Signed)
VASCULAR LAB PRELIMINARY  PRELIMINARY  PRELIMINARY  PRELIMINARY  Bilateral lower extremity venous duplex completed.    Preliminary report:  Bilateral:  No evidence of DVT, superficial thrombosis, or Baker's Cyst.   Khaled Herda, RVS 03/17/2012, 11:16 AM

## 2012-05-21 ENCOUNTER — Encounter (HOSPITAL_COMMUNITY): Payer: Self-pay | Admitting: Emergency Medicine

## 2012-05-21 ENCOUNTER — Emergency Department (INDEPENDENT_AMBULATORY_CARE_PROVIDER_SITE_OTHER)
Admission: EM | Admit: 2012-05-21 | Discharge: 2012-05-21 | Disposition: A | Discharge status: Home / Self Care | Payer: Self-pay | Source: Home / Self Care | Attending: Emergency Medicine | Admitting: Emergency Medicine

## 2012-05-21 DIAGNOSIS — M545 Low back pain, unspecified: Secondary | ICD-10-CM

## 2012-05-21 DIAGNOSIS — M797 Fibromyalgia: Secondary | ICD-10-CM

## 2012-05-21 DIAGNOSIS — IMO0001 Reserved for inherently not codable concepts without codable children: Secondary | ICD-10-CM

## 2012-05-21 DIAGNOSIS — G35 Multiple sclerosis: Secondary | ICD-10-CM

## 2012-05-21 MED ORDER — OXYCODONE-ACETAMINOPHEN 5-325 MG PO TABS
ORAL_TABLET | ORAL | Status: DC
Start: 2012-05-21 — End: 2012-10-15

## 2012-05-21 MED ORDER — KETOROLAC TROMETHAMINE 60 MG/2ML IM SOLN
INTRAMUSCULAR | Status: AC
  Filled 2012-05-21: qty 2

## 2012-05-21 MED ORDER — KETOROLAC TROMETHAMINE 60 MG/2ML IM SOLN
60.0000 mg | Freq: Once | INTRAMUSCULAR | Status: AC
  Administered 2012-05-21: 60 mg via INTRAMUSCULAR

## 2012-05-21 NOTE — ED Notes (Signed)
Pt c/o back and leg pain. Pt has a history of MS and is currently out of medications and needs refills. Pt denies any other symptoms.

## 2012-05-21 NOTE — ED Provider Notes (Signed)
Chief Complaint  Patient presents with  . Back Pain    back and leg pain due to MS. having flare up. out of meds.   . Leg Pain    History of Present Illness:   The patient is a 46 year old female with multiple sclerosis and fibromyalgia. She is being seen by Dr. in [place] and also by pain management, however she lost her Medicaid and has not been able to get her medications refilled. She is on gabapentin and naproxen for the pain. Her pain management doctor was prescribed oxycodone which she was taking 3-4 per day. She describes pain in her bilateral lower back with radiation down both legs with numbness and tingling in her legs and weakness. She denies any bladder or bowel dysfunction. There is pain with any movement and she has difficulty getting up from a seated position, walking, and getting onto the exam table. She needed help to get onto the exam table.  Review of Systems:  Other than noted above, the patient denies any of the following symptoms: Systemic:  No fever, chills, severe fatigue, or unexplained weight loss. GI:  No abdominal pain, nausea, vomiting, diarrhea, constipation, incontinence of bowel, or blood in stool. GU:  No dysuria, frequency, urgency, or hematuria. No incontinence of urine or difficulty urinating.  M-S:  No neck pain, joint pain, arthritis, or myalgias. Neuro:  No paresthesias, saddle anesthesia, muscular weakness, or progressive neurological deficit.  PMFSH:  Past medical history, family history, social history, meds, and allergies were reviewed. Specifically, there is no history of cancer, major trauma, osteoporosis, immunosuppression, HIV, or IV or injection drug use.   Physical Exam:   Vital signs:  BP 105/65  Pulse 63  Temp 99 F (37.2 C) (Oral)  Resp 16  SpO2 99% General:  Alert, oriented, in moderate distress due to lower back pain. She has difficulty in getting up from a seated position and walking across the exam room. She had to be helped onto  the exam table. Abdomen:  Soft, non-tender.  No organomegaly or mass.  No pulsatile midline abdominal mass or bruit. Back:  Entire lower back from the shoulder blades on down the buttocks is extremely tender to touch and has 0 range of motion. She has pain with straight leg raising bilaterally. She also has pain to palpation both of her legs in all the muscle and joint areas from her hips on down to her toes. There is no swelling. Muscle strength is very poor. She describes diminished sensation to light touch in both legs and feet. Neuro:  Decrease in muscle strength in both legs, diminished sensation to light touch, and DTRs were unobtainable. Extremities: Pedal pulses were full, there was no edema. Skin:  Clear, warm and dry.  No rash.  Assessment:  The primary encounter diagnosis was Fibromyalgia. Diagnoses of Low back pain and Multiple sclerosis were also pertinent to this visit.  Plan:   1.  The following meds were prescribed:   New Prescriptions   OXYCODONE-ACETAMINOPHEN (PERCOCET) 5-325 MG PER TABLET    1 to 2 tablets every 6 hours as needed for pain.   2.  The patient was instructed in symptomatic care and handouts were given. I explained to her that we do not treat chronic pain here, but I could give her a few of her oxycodone this, to last until she can get in to see a primary care physician. I told her we would not give her anymore refills on this medication through the  Urgent [facility]. The [facility] database was consulted and there was no evidence of drug abuse or overuse. 3.  The patient was told to return if becoming worse in any way, if no better in 2 weeks, and given some red flag symptoms that would indicate earlier return. 4.  The patient was encouraged to try to be as active as possible and given some exercises to do followed by moist heat.    Reuben Likes, MD 05/21/12 [phone]

## 2012-05-21 NOTE — ED Notes (Signed)
Medication given then will discharge.

## 2012-05-22 DIAGNOSIS — F410 Panic disorder [episodic paroxysmal anxiety] without agoraphobia: Secondary | ICD-10-CM | POA: Insufficient documentation

## 2012-05-22 DIAGNOSIS — G43909 Migraine, unspecified, not intractable, without status migrainosus: Secondary | ICD-10-CM | POA: Insufficient documentation

## 2012-07-19 ENCOUNTER — Telehealth (INDEPENDENT_AMBULATORY_CARE_PROVIDER_SITE_OTHER): Payer: Self-pay | Admitting: General Surgery

## 2012-07-19 NOTE — Telephone Encounter (Signed)
Spoke to pt 07/19/12 to make a bariatric follow-up appt to see Dr. Johna Sheriff. Had [facility] 12/25/07.  Patient stated she is doing great - has lost down to 183 lbs. Can't make an appt - doesn't have insurance right now.  (lss)

## 2012-08-01 DIAGNOSIS — M797 Fibromyalgia: Secondary | ICD-10-CM | POA: Insufficient documentation

## 2012-09-22 ENCOUNTER — Telehealth: Payer: Self-pay | Admitting: *Deleted

## 2012-09-22 NOTE — Telephone Encounter (Signed)
Telephone call, encourage vaginal lubricants for dryness, continue antidepressant has had problems with major depression. Is on in the presence per psychiatrist. Instructed to call if other problems or if would like to pursue testosterone vaginal cream.

## 2012-09-22 NOTE — Telephone Encounter (Signed)
Pt has not been sexual activity in 8 years and has now found a partner. Pt is c/o vaginal dryness and no sex drive, she takes medication for depression and asked if that could be the cause of no sex drive? Pt is calling requesting something to increase all the above due to new partner. Please advise

## 2012-10-15 ENCOUNTER — Encounter (HOSPITAL_COMMUNITY): Payer: Self-pay | Admitting: *Deleted

## 2012-10-15 ENCOUNTER — Emergency Department (INDEPENDENT_AMBULATORY_CARE_PROVIDER_SITE_OTHER)
Admission: EM | Admit: 2012-10-15 | Discharge: 2012-10-15 | Disposition: A | Discharge status: Home / Self Care | Payer: No Typology Code available for payment source | Source: Home / Self Care

## 2012-10-15 ENCOUNTER — Emergency Department (HOSPITAL_COMMUNITY)
Admission: EM | Admit: 2012-10-15 | Discharge: 2012-10-16 | Disposition: A | Discharge status: Home / Self Care | Payer: No Typology Code available for payment source | Attending: Emergency Medicine | Admitting: Emergency Medicine

## 2012-10-15 DIAGNOSIS — X500XXA Overexertion from strenuous movement or load, initial encounter: Secondary | ICD-10-CM | POA: Insufficient documentation

## 2012-10-15 DIAGNOSIS — Y929 Unspecified place or not applicable: Secondary | ICD-10-CM | POA: Insufficient documentation

## 2012-10-15 DIAGNOSIS — S335XXA Sprain of ligaments of lumbar spine, initial encounter: Secondary | ICD-10-CM | POA: Insufficient documentation

## 2012-10-15 DIAGNOSIS — F3289 Other specified depressive episodes: Secondary | ICD-10-CM | POA: Insufficient documentation

## 2012-10-15 DIAGNOSIS — R6883 Chills (without fever): Secondary | ICD-10-CM | POA: Insufficient documentation

## 2012-10-15 DIAGNOSIS — R52 Pain, unspecified: Secondary | ICD-10-CM

## 2012-10-15 DIAGNOSIS — E0500 Thyrotoxicosis with diffuse goiter without thyrotoxic crisis or storm: Secondary | ICD-10-CM | POA: Insufficient documentation

## 2012-10-15 DIAGNOSIS — G8190 Hemiplegia, unspecified affecting unspecified side: Secondary | ICD-10-CM

## 2012-10-15 DIAGNOSIS — Z885 Allergy status to narcotic agent status: Secondary | ICD-10-CM | POA: Insufficient documentation

## 2012-10-15 DIAGNOSIS — R51 Headache: Secondary | ICD-10-CM

## 2012-10-15 DIAGNOSIS — M797 Fibromyalgia: Secondary | ICD-10-CM

## 2012-10-15 DIAGNOSIS — J45909 Unspecified asthma, uncomplicated: Secondary | ICD-10-CM | POA: Insufficient documentation

## 2012-10-15 DIAGNOSIS — M791 Myalgia, unspecified site: Secondary | ICD-10-CM

## 2012-10-15 DIAGNOSIS — Y998 Other external cause status: Secondary | ICD-10-CM | POA: Insufficient documentation

## 2012-10-15 DIAGNOSIS — Z79899 Other long term (current) drug therapy: Secondary | ICD-10-CM | POA: Insufficient documentation

## 2012-10-15 DIAGNOSIS — S39012A Strain of muscle, fascia and tendon of lower back, initial encounter: Secondary | ICD-10-CM

## 2012-10-15 DIAGNOSIS — R519 Headache, unspecified: Secondary | ICD-10-CM

## 2012-10-15 DIAGNOSIS — IMO0001 Reserved for inherently not codable concepts without codable children: Secondary | ICD-10-CM | POA: Insufficient documentation

## 2012-10-15 DIAGNOSIS — R531 Weakness: Secondary | ICD-10-CM

## 2012-10-15 DIAGNOSIS — Z9109 Other allergy status, other than to drugs and biological substances: Secondary | ICD-10-CM | POA: Insufficient documentation

## 2012-10-15 DIAGNOSIS — F329 Major depressive disorder, single episode, unspecified: Secondary | ICD-10-CM | POA: Insufficient documentation

## 2012-10-15 DIAGNOSIS — K219 Gastro-esophageal reflux disease without esophagitis: Secondary | ICD-10-CM | POA: Insufficient documentation

## 2012-10-15 HISTORY — DX: Other allergy status, other than to drugs and biological substances: Z91.09

## 2012-10-15 HISTORY — DX: Fibromyalgia: M79.7

## 2012-10-15 HISTORY — DX: Major depressive disorder, single episode, unspecified: F32.9

## 2012-10-15 HISTORY — DX: Depression, unspecified: F32.A

## 2012-10-15 LAB — CBC WITH DIFFERENTIAL/PLATELET
Basophils Absolute: 0 10*3/uL (ref 0.0–0.1)
Basophils Relative: 0 % (ref 0–1)
Eosinophils Absolute: 0.1 10*3/uL (ref 0.0–0.7)
Eosinophils Relative: 2 % (ref 0–5)
HCT: 37.9 % (ref 36.0–46.0)
Hemoglobin: 12.8 g/dL (ref 12.0–15.0)
Lymphocytes Relative: 30 % (ref 12–46)
Lymphs Abs: 2.2 10*3/uL (ref 0.7–4.0)
MCH: 28.3 pg (ref 26.0–34.0)
MCHC: 33.8 g/dL (ref 30.0–36.0)
MCV: 83.8 fL (ref 78.0–100.0)
Monocytes Absolute: 0.9 10*3/uL (ref 0.1–1.0)
Monocytes Relative: 12 % (ref 3–12)
Neutro Abs: 4 10*3/uL (ref 1.7–7.7)
Neutrophils Relative %: 56 % (ref 43–77)
Platelets: 233 10*3/uL (ref 150–400)
RBC: 4.52 MIL/uL (ref 3.87–5.11)
RDW: 12.9 % (ref 11.5–15.5)
WBC: 7.2 10*3/uL (ref 4.0–10.5)

## 2012-10-15 LAB — POCT URINALYSIS DIP (DEVICE)
Bilirubin Urine: NEGATIVE
Glucose, UA: NEGATIVE mg/dL
Hgb urine dipstick: NEGATIVE
Ketones, ur: NEGATIVE mg/dL
Leukocytes, UA: NEGATIVE
Nitrite: NEGATIVE
Protein, ur: NEGATIVE mg/dL
Specific Gravity, Urine: >=1.03 (ref 1.005–1.030)
Urobilinogen, UA: 1 mg/dL (ref 0.0–1.0)
pH: 6 (ref 5.0–8.0)

## 2012-10-15 LAB — GLUCOSE, CAPILLARY: Glucose-Capillary: 81 mg/dL (ref 70–99)

## 2012-10-15 MED ORDER — MORPHINE SULFATE 4 MG/ML IJ SOLN
6.0000 mg | Freq: Once | INTRAMUSCULAR | Status: AC
  Administered 2012-10-16: 6 mg via INTRAVENOUS
  Filled 2012-10-15: qty 2

## 2012-10-15 MED ORDER — ONDANSETRON HCL 4 MG/2ML IJ SOLN
4.0000 mg | Freq: Once | INTRAMUSCULAR | Status: AC
  Administered 2012-10-16: 4 mg via INTRAVENOUS
  Filled 2012-10-15: qty 2

## 2012-10-15 MED ORDER — SODIUM CHLORIDE 0.9 % IV BOLUS (SEPSIS)
1000.0000 mL | Freq: Once | INTRAVENOUS | Status: AC
  Administered 2012-10-16: 1000 mL via INTRAVENOUS

## 2012-10-15 NOTE — ED Notes (Addendum)
NURSE FIRST ROUNDS : NURSE EXPLAINED DELAY , WAIT TIME AND PROCESS TO PT. PILLOW GIVEN FOR COMFORT , DENIES PAIN AT THIS TIME , SITTING AT [facility] WITH NO DISTRESS . RESPIRATIONS UNLABORED .  NURSE EXPLAINED TO PT. THAT WE CAN NOT GIVE HER FOOD UNTIL SEEN BY A EDP - CBG CHECKED AT TRIAGE .

## 2012-10-15 NOTE — ED Notes (Signed)
Told doctor she had a fever 102 last night and 101 this AM.

## 2012-10-15 NOTE — ED Notes (Signed)
C/o upper back pain on the L side and pain in L leg, arm and L side of neck onset 3 days ago.  Neck pain onset last night.  Started like her fibromyalgia pain then the pain went from burning to sharp pains.

## 2012-10-15 NOTE — ED Provider Notes (Signed)
History     CSN: [id number]  Arrival date & time 10/15/12  1615   None     Chief Complaint  Patient presents with  . Back Pain    (Consider location/radiation/quality/duration/timing/severity/associated sxs/prior treatment) HPI Comments: 47 year old female with history of fibromyalgia and chronic pain among other chronic comorbidities. Here complaining of severe left side headache and neck pain since last night. Patient also complaining of left side upper extremity weakness and pain and left lower extremity pain. Also complaining of back pain and urinary frequency in the last 3 days. Patient reports temperature of 102 last evening associated with nausea. Reports temp of 101 this morning. She's took ibuprofen 400 mg last time less than 6 hours ago. Denies visual changes. Denies burning on urination. Denies seizures or incontinence. States fibromyalgia exacerbations have not been on only one side of body before.     Past Medical History  Diagnosis Date  . Asthma   . GERD (gastroesophageal reflux disease)   . Grave's disease   . Fibromyalgia     Past Surgical History  Procedure Laterality Date  . Abdominal hysterectomy      partial  . Gastric bypass  2008    Family History  Problem Relation Age of Onset  . Hypertension Mother   . Hypertension Sister   . Diabetes Other   . Hypertension Other   . Diabetes Maternal Uncle     History  Substance Use Topics  . Smoking status: Never Smoker   . Smokeless tobacco: Not on file  . Alcohol Use: No    OB History   Grav Para Term Preterm Abortions TAB SAB Ect Mult Living   2 2        2       Review of Systems  Constitutional: Positive for fever.  HENT: Positive for congestion. Negative for sore throat.   Eyes: Negative for visual disturbance.  Respiratory: Negative for cough and shortness of breath.   Cardiovascular: Negative for chest pain.  Gastrointestinal: Positive for nausea and vomiting. Negative for abdominal pain.   Genitourinary: Positive for frequency. Negative for dysuria.  Musculoskeletal: Positive for myalgias and back pain. Negative for joint swelling.  Neurological: Positive for weakness and headaches. Negative for seizures and speech difficulty.    Allergies  Mushroom ext cmplx(shiitake-reishi-mait); Nutritional supplements; Shellfish allergy; and Hydrocodone-acetaminophen  Home Medications   Current Outpatient Rx  Name  Route  Sig  Dispense  Refill  . albuterol (PROVENTIL HFA;VENTOLIN HFA) 108 (90 BASE) MCG/ACT inhaler   Inhalation   Inhale 2 puffs into the lungs every 6 (six) hours as needed for wheezing.         Marland Kitchen ALPRAZolam (XANAX) 0.5 MG tablet   Oral   Take 0.5 mg by mouth 2 (two) times daily as needed for sleep.         Marland Kitchen azelastine (ASTELIN) 137 MCG/SPRAY nasal spray   Nasal   Place 1 spray into the nose 2 (two) times daily. Use in each nostril as directed         . Biotin 5000 MCG TABS   Oral   Take by mouth.         . Cholecalciferol (VITAMIN D) 2000 UNITS CAPS   Oral   Take by mouth.         . DiphenhydrAMINE HCl, Sleep, 50 MG CAPS   Oral   Take by mouth.         . Epinastine HCl (ELESTAT) 0.05 % ophthalmic solution  Both Eyes   Place 1 drop into both eyes 2 (two) times daily.         Marland Kitchen estradiol (ESTRACE) 0.5 MG tablet   Oral   Take 0.5 mg by mouth daily.         Marland Kitchen gabapentin (NEURONTIN) 300 MG capsule   Oral   Take 300 mg by mouth 3 (three) times daily.         . meloxicam (MOBIC) 7.5 MG tablet   Oral   Take 7.5 mg by mouth daily.         . mometasone (NASONEX) 50 MCG/ACT nasal spray   Nasal   Place 2 sprays into the nose daily.         . mometasone-formoterol (DULERA) 100-5 MCG/ACT AERO   Inhalation   Inhale 2 puffs into the lungs 2 (two) times daily.           . risperiDONE (RISPERDAL) 1 MG tablet   Oral   Take 1 mg by mouth at bedtime.         Marland Kitchen tiZANidine (ZANAFLEX) 4 MG capsule   Oral   Take 4 mg by mouth 3  (three) times daily.         Marland Kitchen topiramate (TOPAMAX) 25 MG tablet   Oral   Take 100 mg by mouth 2 (two) times daily.         . traZODone (DESYREL) 100 MG tablet   Oral   Take 100 mg by mouth at bedtime.         . traZODone (DESYREL) 50 MG tablet   Oral   Take 100 mg by mouth at bedtime.          . butalbital-acetaminophen-caffeine (FIORICET, ESGIC) 50-325-40 MG per tablet   Oral   Take 1 tablet by mouth 2 (two) times daily as needed. For pain         . calcium carbonate (TUMS - DOSED IN MG ELEMENTAL CALCIUM) 500 MG chewable tablet   Oral   Chew 3 tablets by mouth daily.         . cetirizine (ZYRTEC) 10 MG tablet   Oral   Take 10 mg by mouth daily.         . citalopram (CELEXA) 10 MG tablet   Oral   Take 10 mg by mouth daily.         Marland Kitchen EPINEPHrine (EPIPEN) 0.3 mg/0.3 mL DEVI   Intramuscular   Inject 0.3 mg into the muscle as needed. For allergic reaction to stinging insects, mushrooms, and shellfish          . escitalopram (LEXAPRO) 10 MG tablet   Oral   Take 10 mg by mouth daily.         Marland Kitchen oxyCODONE-acetaminophen (PERCOCET) 5-325 MG per tablet   Oral   Take 1 tablet by mouth 3 (three) times daily.         Marland Kitchen oxyCODONE-acetaminophen (PERCOCET) 5-325 MG per tablet      1 to 2 tablets every 6 hours as needed for pain.   15 tablet   0   . promethazine (PHENERGAN) 25 MG tablet   Oral   Take 25 mg by mouth every 6 (six) hours as needed. For nausea         . EXPIRED: zolpidem (AMBIEN) 10 MG tablet   Oral   Take 1 tablet (10 mg total) by mouth at bedtime as needed for sleep.   15 tablet   0  BP 124/75  Pulse 70  Temp(Src) 98.8 F (37.1 C) (Oral)  Resp 18  SpO2 100%  Physical Exam  Nursing note and vitals reviewed. Constitutional: She is oriented to person, place, and time. She appears well-developed and well-nourished. No distress.  Slow processing and slow but appropriate speech. Low voice. Non toxic appearance.  HENT:   Mouth/Throat: Oropharynx is clear and moist. No oropharyngeal exudate.  Eyes: Conjunctivae are normal. No scleral icterus.  Neck: No JVD present.  Neck rigidity, tender to palpation on left side. Patient unable to flex or extend due to pain. Difficult exam due to reported pain.   Cardiovascular: Regular rhythm and normal heart sounds.   Pulmonary/Chest: Breath sounds normal. No respiratory distress. She has no wheezes. She has no rales. She exhibits no tenderness.  Abdominal: Soft.  No CVT  Lymphadenopathy:    She has no cervical adenopathy.  Neurological: She is alert and oriented to person, place, and time. She has normal reflexes. She exhibits abnormal muscle tone. GCS eye subscore is 4. GCS verbal subscore is 5. GCS motor subscore is 6.  Difficult exam as patient is uncomfortable with pain and barely moves. Left arm drop and tremor.  No hyperreflexia.  hyperesthesia in left side of neck, left arm and left thigh.  Skin: No rash noted. She is not diaphoretic.    ED Course  Procedures (including critical care time)  Labs Reviewed  POCT URINALYSIS DIP (DEVICE)   No results found.   1. Acute left-sided weakness   2. Left-sided headache   3. Pain of left side of body       MDM  47 year old female with history of fibromyalgia among other chronic comorbidities. Here complaining of severe left side headache and neck pain since last night. Patient also complaining of left side upper extremity weakness and pain and left lower extremity pain. Also complaining of back pain and urinary frequency in the last 3 days. Patient reports temperature of 102 last evening and 101 this morning. She's took ibuprofen 400mg  last time less than 6 hours ago. Denies visual changes. On exam: Afebrile. Vital signs normal and stable. Neuro: Left arm drop and hyperesthesia. Abdomen normal. Normal point-of-care urinalysis. Decided to transfer patient to the emergency department for further evaluation and  management.         Sharin Grave, MD 10/15/12 1956

## 2012-10-15 NOTE — ED Notes (Addendum)
Here from [facility]. Here with multiple pains. "feels like tingling, sharp and muscle spasms, primarily on L side, also chills and nausea", onset 3d ago. Urine resulted from [facility]. Denies nausea at this time, VSS/WNL. Alert, NAD, calm, interactive, resps e/u, speaking in clear complete sentences, flat affect. pt added depression to PMH.

## 2012-10-16 LAB — BASIC METABOLIC PANEL
BUN: 21 mg/dL (ref 6–23)
CO2: 24 mEq/L (ref 19–32)
Calcium: 9.1 mg/dL (ref 8.4–10.5)
Chloride: 103 mEq/L (ref 96–112)
Creatinine, Ser: 0.75 mg/dL (ref 0.50–1.10)
GFR calc Af Amer: >90 mL/min (ref >90)
GFR calc non Af Amer: >90 mL/min (ref >90)
Glucose, Bld: 155 mg/dL — ABNORMAL HIGH (ref 70–99)
Potassium: 3.7 mEq/L (ref 3.5–5.1)
Sodium: 136 mEq/L (ref 135–145)

## 2012-10-16 MED ORDER — PREDNISONE 50 MG PO TABS: 50.0000 mg | ORAL_TABLET | Freq: Every day | ORAL | Status: DC

## 2012-10-16 MED ORDER — KETOROLAC TROMETHAMINE 30 MG/ML IJ SOLN
30.0000 mg | Freq: Once | INTRAMUSCULAR | Status: AC
Start: 2012-10-16 — End: 2012-10-16
  Administered 2012-10-16: 30 mg via INTRAVENOUS
  Filled 2012-10-16: qty 1

## 2012-10-16 MED ORDER — DIAZEPAM 5 MG PO TABS: 5.0000 mg | ORAL_TABLET | Freq: Three times a day (TID) | ORAL | Status: DC | PRN

## 2012-10-16 MED ORDER — OXYCODONE-ACETAMINOPHEN 5-325 MG PO TABS: 1.0000 | ORAL_TABLET | Freq: Four times a day (QID) | ORAL | Status: DC | PRN

## 2012-10-16 NOTE — ED Notes (Signed)
IV Team Paged 

## 2012-10-16 NOTE — ED Notes (Signed)
Pt comfortable with d/c and f/u instructions. Prescriptions x3 

## 2012-10-16 NOTE — ED Notes (Signed)
IV team at bedside 

## 2012-10-16 NOTE — ED Provider Notes (Signed)
History     CSN: [id number]  Arrival date & time 10/15/12  1949   First MD Initiated Contact with Patient 10/15/12 2254      Chief Complaint  Patient presents with  . Chills  . Generalized Body Aches  . Leg Pain  . Flank Pain    (Consider location/radiation/quality/duration/timing/severity/associated sxs/prior treatment) HPI Patient presents to the emergency department with an attack of fibromyalgia and low back pain that radiates to her neck.  Patient, states it started out as a fibromyalgia attack, and she bent over to pull up her jeans and felt a sharp pain go from her leg to her lower back and then all of the way up to her neck. the patient, states, that she's had chills, and she, thinks she's had a fever. the patient denies chest pain, shortness of breath, nausea, vomiting, diarrhea, headache, blurred vision, weakness, numbness rash, syncope or photophobia. patient states she took ibuprofen and muscle relaxers before arrival.  The patient, states, that this did not help her pain. patient states, that palpation makes her pain, worse Past Medical History  Diagnosis Date  . Asthma   . GERD (gastroesophageal reflux disease)   . Grave's disease   . Fibromyalgia   . Environmental allergies   . Depression     Past Surgical History  Procedure Laterality Date  . Abdominal hysterectomy      partial  . Gastric bypass  2008    Family History  Problem Relation Age of Onset  . Hypertension Mother   . Cirrhosis Mother   . Hypertension Sister   . Diabetes Other   . Hypertension Other   . Diabetes Maternal Uncle   . Hypertension Father     History  Substance Use Topics  . Smoking status: Never Smoker   . Smokeless tobacco: Not on file  . Alcohol Use: No    OB History   Grav Para Term Preterm Abortions TAB SAB Ect Mult Living   2 2        2       Review of Systems All other systems negative except as documented in the HPI. All pertinent positives and negatives as  reviewed in the HPI. Allergies  Mushroom ext cmplx(shiitake-reishi-mait); Nutritional supplements; Shellfish allergy; and Hydrocodone-acetaminophen  Home Medications   Current Outpatient Rx  Name  Route  Sig  Dispense  Refill  . albuterol (PROVENTIL HFA;VENTOLIN HFA) 108 (90 BASE) MCG/ACT inhaler   Inhalation   Inhale 2 puffs into the lungs every 6 (six) hours as needed for wheezing.         Marland Kitchen ALPRAZolam (XANAX) 0.5 MG tablet   Oral   Take 0.5 mg by mouth 2 (two) times daily as needed for sleep.         Marland Kitchen azelastine (ASTELIN) 137 MCG/SPRAY nasal spray   Nasal   Place 1 spray into the nose 2 (two) times daily. Use in each nostril as directed         . Biotin 5000 MCG TABS   Oral   Take by mouth.         . calcium carbonate (TUMS - DOSED IN MG ELEMENTAL CALCIUM) 500 MG chewable tablet   Oral   Chew 3 tablets by mouth daily.         . Cholecalciferol (VITAMIN D) 2000 UNITS CAPS   Oral   Take by mouth.         . DiphenhydrAMINE HCl, Sleep, 50 MG CAPS   Oral  Take by mouth.         . Epinastine HCl (ELESTAT) 0.05 % ophthalmic solution   Both Eyes   Place 1 drop into both eyes 2 (two) times daily.         Marland Kitchen EPINEPHrine (EPIPEN) 0.3 mg/0.3 mL DEVI   Intramuscular   Inject 0.3 mg into the muscle as needed. For allergic reaction to stinging insects, mushrooms, and shellfish          . estradiol (ESTRACE) 0.5 MG tablet   Oral   Take 0.5 mg by mouth daily.         Marland Kitchen gabapentin (NEURONTIN) 300 MG capsule   Oral   Take 600 mg by mouth 4 (four) times daily.          Marland Kitchen ibuprofen (ADVIL,MOTRIN) 200 MG tablet   Oral   Take 800 mg by mouth every 8 (eight) hours as needed for pain.         . meloxicam (MOBIC) 7.5 MG tablet   Oral   Take 7.5 mg by mouth 2 (two) times daily.          . mometasone (NASONEX) 50 MCG/ACT nasal spray   Nasal   Place 2 sprays into the nose daily.         . mometasone-formoterol (DULERA) 100-5 MCG/ACT AERO    Inhalation   Inhale 2 puffs into the lungs 2 (two) times daily.           . risperiDONE (RISPERDAL) 1 MG tablet   Oral   Take 1 mg by mouth at bedtime.         Marland Kitchen tiZANidine (ZANAFLEX) 4 MG capsule   Oral   Take 4 mg by mouth 3 (three) times daily.         Marland Kitchen topiramate (TOPAMAX) 25 MG tablet   Oral   Take 100 mg by mouth 2 (two) times daily.         . traZODone (DESYREL) 100 MG tablet   Oral   Take 100 mg by mouth at bedtime.           BP 107/67  Pulse 73  Temp(Src) 98.4 F (36.9 C) (Oral)  Resp 16  SpO2 99%  Physical Exam  Nursing note and vitals reviewed. Constitutional: She is oriented to person, place, and time. She appears well-developed and well-nourished. No distress.  HENT:  Head: Normocephalic and atraumatic.  Mouth/Throat: Oropharynx is clear and moist.  Eyes: EOM are normal. Pupils are equal, round, and reactive to light.  Neck: Normal range of motion. Neck supple. No rigidity. No Brudzinski's sign and no Kernig's sign noted.  Cardiovascular: Normal rate, regular rhythm and normal heart sounds.  Exam reveals no gallop and no friction rub.   No murmur heard. Pulmonary/Chest: Effort normal and breath sounds normal. No respiratory distress.  Abdominal: Soft. Bowel sounds are normal. She exhibits no distension. There is no tenderness. There is no guarding.  Lymphadenopathy:    She has no cervical adenopathy.  Neurological: She is alert and oriented to person, place, and time. She has normal strength and normal reflexes. No sensory deficit. She exhibits normal muscle tone. Coordination and gait normal. GCS eye subscore is 4. GCS verbal subscore is 5. GCS motor subscore is 6.  Skin: Skin is warm and dry. No rash noted.    ED Course  Procedures (including critical care time)  Labs Reviewed  BASIC METABOLIC PANEL - Abnormal; Notable for the following:    Glucose,  Bld 155 (*)    All other components within normal limits  GLUCOSE, CAPILLARY  CBC WITH  DIFFERENTIAL  URINALYSIS, ROUTINE W REFLEX MICROSCOPIC   Patient be treated for lumbar strain. Patient does not have any signs of fever, here in the emergency department and no nuchal rigidity.  Patient will be advised followup with her primary care Dr. Algis Downs her to also increase her fluid intake.told to return here for any worsening in her condition.  Patient does not have any signs of infection here in the emergency department.  She's not had any cough, runny nose, sore throat, or URI symptoms  MDM  MDM Reviewed: vitals, nursing note and previous chart Interpretation: labs            Carlyle Dolly, PA-C 10/16/12 [phone]

## 2012-10-17 NOTE — ED Provider Notes (Signed)
Medical screening examination/treatment/procedure(s) were performed by non-physician practitioner and as supervising physician I was immediately available for consultation/collaboration.  Donnetta Hutching, MD 10/17/12 [phone]

## 2012-11-01 ENCOUNTER — Emergency Department (HOSPITAL_COMMUNITY): Payer: No Typology Code available for payment source

## 2012-11-01 ENCOUNTER — Inpatient Hospital Stay (HOSPITAL_COMMUNITY)
Admission: EM | Admit: 2012-11-01 | Discharge: 2012-11-04 | DRG: 917 | Disposition: A | Discharge status: Home / Self Care | Payer: No Typology Code available for payment source | Attending: Family Medicine | Admitting: Family Medicine

## 2012-11-01 ENCOUNTER — Encounter (HOSPITAL_COMMUNITY): Payer: Self-pay

## 2012-11-01 DIAGNOSIS — R5383 Other fatigue: Secondary | ICD-10-CM | POA: Diagnosis present

## 2012-11-01 DIAGNOSIS — Z6831 Body mass index (BMI) 31.0-31.9, adult: Secondary | ICD-10-CM

## 2012-11-01 DIAGNOSIS — R4182 Altered mental status, unspecified: Secondary | ICD-10-CM

## 2012-11-01 DIAGNOSIS — G8929 Other chronic pain: Secondary | ICD-10-CM

## 2012-11-01 DIAGNOSIS — T50911A Poisoning by multiple unspecified drugs, medicaments and biological substances, accidental (unintentional), initial encounter: Secondary | ICD-10-CM

## 2012-11-01 DIAGNOSIS — F32A Depression, unspecified: Secondary | ICD-10-CM

## 2012-11-01 DIAGNOSIS — Z9884 Bariatric surgery status: Secondary | ICD-10-CM

## 2012-11-01 DIAGNOSIS — E876 Hypokalemia: Secondary | ICD-10-CM

## 2012-11-01 DIAGNOSIS — E43 Unspecified severe protein-calorie malnutrition: Secondary | ICD-10-CM

## 2012-11-01 DIAGNOSIS — F329 Major depressive disorder, single episode, unspecified: Secondary | ICD-10-CM

## 2012-11-01 DIAGNOSIS — F3160 Bipolar disorder, current episode mixed, unspecified: Secondary | ICD-10-CM

## 2012-11-01 DIAGNOSIS — T50901A Poisoning by unspecified drugs, medicaments and biological substances, accidental (unintentional), initial encounter: Secondary | ICD-10-CM

## 2012-11-01 DIAGNOSIS — K219 Gastro-esophageal reflux disease without esophagitis: Secondary | ICD-10-CM | POA: Diagnosis present

## 2012-11-01 DIAGNOSIS — T50991A Poisoning by other drugs, medicaments and biological substances, accidental (unintentional), initial encounter: Principal | ICD-10-CM | POA: Diagnosis present

## 2012-11-01 DIAGNOSIS — T50992A Poisoning by other drugs, medicaments and biological substances, intentional self-harm, initial encounter: Secondary | ICD-10-CM | POA: Diagnosis present

## 2012-11-01 DIAGNOSIS — R5381 Other malaise: Secondary | ICD-10-CM | POA: Diagnosis present

## 2012-11-01 DIAGNOSIS — F3130 Bipolar disorder, current episode depressed, mild or moderate severity, unspecified: Secondary | ICD-10-CM | POA: Diagnosis present

## 2012-11-01 DIAGNOSIS — Y92009 Unspecified place in unspecified non-institutional (private) residence as the place of occurrence of the external cause: Secondary | ICD-10-CM

## 2012-11-01 HISTORY — DX: Poisoning by multiple unspecified drugs, medicaments and biological substances, accidental (unintentional), initial encounter: T50.911A

## 2012-11-01 LAB — CBC WITH DIFFERENTIAL/PLATELET
Basophils Absolute: 0 10*3/uL (ref 0.0–0.1)
Basophils Relative: 0 % (ref 0–1)
Eosinophils Absolute: 0 10*3/uL (ref 0.0–0.7)
Eosinophils Relative: 1 % (ref 0–5)
HCT: 35.3 % — ABNORMAL LOW (ref 36.0–46.0)
Hemoglobin: 11.5 g/dL — ABNORMAL LOW (ref 12.0–15.0)
Lymphocytes Relative: 21 % (ref 12–46)
Lymphs Abs: 1.6 10*3/uL (ref 0.7–4.0)
MCH: 27.3 pg (ref 26.0–34.0)
MCHC: 32.6 g/dL (ref 30.0–36.0)
MCV: 83.6 fL (ref 78.0–100.0)
Monocytes Absolute: 0.8 10*3/uL (ref 0.1–1.0)
Monocytes Relative: 10 % (ref 3–12)
Neutro Abs: 5.2 10*3/uL (ref 1.7–7.7)
Neutrophils Relative %: 68 % (ref 43–77)
Platelets: 212 10*3/uL (ref 150–400)
RBC: 4.22 MIL/uL (ref 3.87–5.11)
RDW: 12.8 % (ref 11.5–15.5)
WBC: 7.6 10*3/uL (ref 4.0–10.5)

## 2012-11-01 LAB — COMPREHENSIVE METABOLIC PANEL
ALT: 6 U/L (ref 0–35)
AST: 10 U/L (ref 0–37)
Albumin: 3.3 g/dL — ABNORMAL LOW (ref 3.5–5.2)
Alkaline Phosphatase: 63 U/L (ref 39–117)
BUN: 12 mg/dL (ref 6–23)
CO2: 23 mEq/L (ref 19–32)
Calcium: 9.1 mg/dL (ref 8.4–10.5)
Chloride: 106 mEq/L (ref 96–112)
Creatinine, Ser: 0.83 mg/dL (ref 0.50–1.10)
GFR calc Af Amer: >90 mL/min (ref >90)
GFR calc non Af Amer: 81 mL/min — ABNORMAL LOW (ref >90)
Glucose, Bld: 189 mg/dL — ABNORMAL HIGH (ref 70–99)
Potassium: 3.2 mEq/L — ABNORMAL LOW (ref 3.5–5.1)
Sodium: 140 mEq/L (ref 135–145)
Total Bilirubin: 0.2 mg/dL — ABNORMAL LOW (ref 0.3–1.2)
Total Protein: 6.9 g/dL (ref 6.0–8.3)

## 2012-11-01 LAB — ABO/RH: ABO/RH(D): A POS

## 2012-11-01 LAB — TYPE AND SCREEN
ABO/RH(D): A POS
Antibody Screen: NEGATIVE

## 2012-11-01 LAB — RAPID URINE DRUG SCREEN, HOSP PERFORMED
Amphetamines: POSITIVE — AB
Barbiturates: NOT DETECTED
Benzodiazepines: NOT DETECTED
Cocaine: NOT DETECTED
Opiates: NOT DETECTED
Tetrahydrocannabinol: NOT DETECTED

## 2012-11-01 LAB — URINALYSIS, ROUTINE W REFLEX MICROSCOPIC
Glucose, UA: NEGATIVE mg/dL
Hgb urine dipstick: NEGATIVE
Ketones, ur: NEGATIVE mg/dL
Leukocytes, UA: NEGATIVE
Nitrite: NEGATIVE
Protein, ur: NEGATIVE mg/dL
Specific Gravity, Urine: 1.026 (ref 1.005–1.030)
Urobilinogen, UA: 1 mg/dL (ref 0.0–1.0)
pH: 6 (ref 5.0–8.0)

## 2012-11-01 LAB — PROTIME-INR
INR: 1.06 (ref 0.00–1.49)
Prothrombin Time: 13.7 seconds (ref 11.6–15.2)

## 2012-11-01 LAB — APTT: aPTT: 28 seconds (ref 24–37)

## 2012-11-01 LAB — ETHANOL: Alcohol, Ethyl (B): <11 mg/dL (ref 0–11)

## 2012-11-01 MED ORDER — POLYETHYLENE GLYCOL 3350 17 G PO PACK
17.0000 g | PACK | Freq: Every day | ORAL | Status: DC | PRN
  Filled 2012-11-01: qty 1

## 2012-11-01 MED ORDER — HEPARIN SODIUM (PORCINE) 5000 UNIT/ML IJ SOLN
5000.0000 [IU] | Freq: Three times a day (TID) | INTRAMUSCULAR | Status: DC
  Administered 2012-11-01 – 2012-11-04 (×7): 5000 [IU] via SUBCUTANEOUS
  Filled 2012-11-01 (×11): qty 1

## 2012-11-01 MED ORDER — OLOPATADINE HCL 0.1 % OP SOLN
1.0000 [drp] | Freq: Two times a day (BID) | OPHTHALMIC | Status: DC
  Administered 2012-11-02 – 2012-11-04 (×5): 1 [drp] via OPHTHALMIC
  Filled 2012-11-01 (×2): qty 5

## 2012-11-01 MED ORDER — SODIUM CHLORIDE 0.9 % IV SOLN
1000.0000 mL | INTRAVENOUS | Status: DC
  Administered 2012-11-01 – 2012-11-03 (×6): 1000 mL via INTRAVENOUS

## 2012-11-01 MED ORDER — ESTRADIOL 1 MG PO TABS
0.5000 mg | ORAL_TABLET | Freq: Every day | ORAL | Status: DC
  Administered 2012-11-02 – 2012-11-04 (×3): 0.5 mg via ORAL
  Filled 2012-11-01 (×3): qty 0.5

## 2012-11-01 MED ORDER — TIZANIDINE HCL 4 MG PO TABS
4.0000 mg | ORAL_TABLET | Freq: Three times a day (TID) | ORAL | Status: DC
  Administered 2012-11-02 – 2012-11-04 (×7): 4 mg via ORAL
  Filled 2012-11-01 (×10): qty 1

## 2012-11-01 MED ORDER — RISPERIDONE 1 MG PO TABS
1.0000 mg | ORAL_TABLET | Freq: Every day | ORAL | Status: DC
  Administered 2012-11-02 – 2012-11-03 (×2): 1 mg via ORAL
  Filled 2012-11-01 (×4): qty 1

## 2012-11-01 MED ORDER — ONDANSETRON HCL 4 MG PO TABS
4.0000 mg | ORAL_TABLET | Freq: Four times a day (QID) | ORAL | Status: DC | PRN
  Administered 2012-11-02: 4 mg via ORAL
  Filled 2012-11-01: qty 1

## 2012-11-01 MED ORDER — CALCIUM CARBONATE ANTACID 500 MG PO CHEW
3.0000 | CHEWABLE_TABLET | Freq: Every day | ORAL | Status: DC
  Administered 2012-11-02 – 2012-11-04 (×3): 600 mg via ORAL
  Filled 2012-11-01 (×3): qty 3

## 2012-11-01 MED ORDER — IBUPROFEN 800 MG PO TABS
800.0000 mg | ORAL_TABLET | Freq: Four times a day (QID) | ORAL | Status: DC | PRN
  Administered 2012-11-02 – 2012-11-04 (×3): 800 mg via ORAL
  Filled 2012-11-01 (×3): qty 1

## 2012-11-01 MED ORDER — ALPRAZOLAM 0.5 MG PO TABS
0.5000 mg | ORAL_TABLET | Freq: Two times a day (BID) | ORAL | Status: DC | PRN
  Administered 2012-11-03 (×2): 0.5 mg via ORAL
  Filled 2012-11-01 (×2): qty 1

## 2012-11-01 MED ORDER — SODIUM CHLORIDE 0.9 % IJ SOLN: 3.0000 mL | Freq: Two times a day (BID) | INTRAMUSCULAR | Status: DC

## 2012-11-01 MED ORDER — SODIUM CHLORIDE 0.9 % IV SOLN: INTRAVENOUS | Status: AC

## 2012-11-01 MED ORDER — NALOXONE HCL 1 MG/ML IJ SOLN
1.0000 mg | INTRAMUSCULAR | Status: DC | PRN
  Administered 2012-11-01: 1 mg via INTRAVENOUS
  Filled 2012-11-01 (×2): qty 2

## 2012-11-01 MED ORDER — TOPIRAMATE 100 MG PO TABS
100.0000 mg | ORAL_TABLET | Freq: Two times a day (BID) | ORAL | Status: DC
  Administered 2012-11-01 – 2012-11-04 (×6): 100 mg via ORAL
  Filled 2012-11-01 (×7): qty 1

## 2012-11-01 MED ORDER — EPINASTINE HCL 0.05 % OP SOLN: 1.0000 [drp] | Freq: Two times a day (BID) | OPHTHALMIC | Status: DC

## 2012-11-01 MED ORDER — ALBUTEROL SULFATE HFA 108 (90 BASE) MCG/ACT IN AERS
2.0000 | INHALATION_SPRAY | Freq: Four times a day (QID) | RESPIRATORY_TRACT | Status: DC | PRN
  Filled 2012-11-01: qty 6.7

## 2012-11-01 MED ORDER — ONDANSETRON HCL 4 MG/2ML IJ SOLN
4.0000 mg | Freq: Four times a day (QID) | INTRAMUSCULAR | Status: DC | PRN
  Administered 2012-11-03: 4 mg via INTRAVENOUS
  Filled 2012-11-01: qty 2

## 2012-11-01 NOTE — H&P (Addendum)
[facility] History and Physical  [name] ZOX:[id number] DOB: 03/23/1966 DOA: 11/01/2012  Referring physician: Dr. Lynelle Doctor PCP: Dartha Lodge, FNP  Specialists: none  Chief Complaint: Ingestion of substance  HPI: [name] is a 47 y.o. female  Past medical history of depression that presents to the emergency room for probable drug overdose. The patient has been lethargic and is hard to obtain a history from her. When she arrived to the ED as per previous note from staff she related that she took one extra dose of her Risperdal as a suicide attempt. She took an unknown amount of pills and drove herself to the emergency room. Here she is mumbling hard to arouse able to answer yes and no questions but when she tries to his hearing complete sentences she does not makes any sense  Review of Systems: The patient denies anorexia, fever, weight loss,, vision loss, decreased hearing, hoarseness, chest pain, syncope, dyspnea on exertion, peripheral edema, balance deficits, hemoptysis, abdominal pain, melena, hematochezia, severe indigestion/heartburn, hematuria, incontinence, genital sores, muscle weakness, suspicious skin lesions, transient blindness, difficulty walking, depression, unusual weight change, abnormal bleeding, enlarged lymph nodes, angioedema, and breast masses.    Past Medical History  Diagnosis Date  . Asthma   . GERD (gastroesophageal reflux disease)   . Grave's disease   . Fibromyalgia   . Environmental allergies   . Depression    Past Surgical History  Procedure Laterality Date  . Abdominal hysterectomy      partial  . Gastric bypass  2008   Social History:  reports that she has never smoked. She does not have any smokeless tobacco history on file. She reports that she does not drink alcohol or use illicit drugs.   Allergies  Allergen Reactions  . Mushroom Ext Cmplx(Shiitake-Reishi-Mait) Anaphylaxis  . Nutritional Supplements Anaphylaxis    And other  stinging insects  . Shellfish Allergy Anaphylaxis  . Hydrocodone-Acetaminophen Itching    Family History  Problem Relation Age of Onset  . Hypertension Mother   . Cirrhosis Mother   . Hypertension Sister   . Diabetes Other   . Hypertension Other   . Diabetes Maternal Uncle   . Hypertension Father     Prior to Admission medications   Medication Sig Start Date End Date Taking? Authorizing Provider  albuterol (PROVENTIL HFA;VENTOLIN HFA) 108 (90 BASE) MCG/ACT inhaler Inhale 2 puffs into the lungs every 6 (six) hours as needed for wheezing.    Historical Provider, MD  ALPRAZolam Prudy Feeler) 0.5 MG tablet Take 0.5 mg by mouth 2 (two) times daily as needed for sleep.    Historical Provider, MD  azelastine (ASTELIN) 137 MCG/SPRAY nasal spray Place 1 spray into the nose 2 (two) times daily. Use in each nostril as directed    Historical Provider, MD  Biotin 5000 MCG TABS Take by mouth.    Historical Provider, MD  calcium carbonate (TUMS - DOSED IN MG ELEMENTAL CALCIUM) 500 MG chewable tablet Chew 3 tablets by mouth daily.    Historical Provider, MD  Cholecalciferol (VITAMIN D) 2000 UNITS CAPS Take by mouth.    Historical Provider, MD  diazepam (VALIUM) 5 MG tablet Take 1 tablet (5 mg total) by mouth every 8 (eight) hours as needed (Muscle spasm). 10/16/12   Jamesetta Orleans Lawyer, PA-C  DiphenhydrAMINE HCl, Sleep, 50 MG CAPS Take by mouth.    Historical Provider, MD  Epinastine HCl (ELESTAT) 0.05 % ophthalmic solution Place 1 drop into both eyes 2 (two) times daily.  Historical Provider, MD  EPINEPHrine (EPIPEN) 0.3 mg/0.3 mL DEVI Inject 0.3 mg into the muscle as needed. For allergic reaction to stinging insects, mushrooms, and shellfish     Historical Provider, MD  estradiol (ESTRACE) 0.5 MG tablet Take 0.5 mg by mouth daily.    Historical Provider, MD  gabapentin (NEURONTIN) 300 MG capsule Take 600 mg by mouth 4 (four) times daily.     Historical Provider, MD  ibuprofen (ADVIL,MOTRIN) 200 MG tablet  Take 800 mg by mouth every 8 (eight) hours as needed for pain.    Historical Provider, MD  meloxicam (MOBIC) 7.5 MG tablet Take 7.5 mg by mouth 2 (two) times daily.     Historical Provider, MD  mometasone (NASONEX) 50 MCG/ACT nasal spray Place 2 sprays into the nose daily.    Historical Provider, MD  mometasone-formoterol (DULERA) 100-5 MCG/ACT AERO Inhale 2 puffs into the lungs 2 (two) times daily.      Historical Provider, MD  oxyCODONE-acetaminophen (PERCOCET/ROXICET) 5-325 MG per tablet Take 1 tablet by mouth every 6 (six) hours as needed for pain. 10/16/12   Jamesetta Orleans Lawyer, PA-C  predniSONE (DELTASONE) 50 MG tablet Take 1 tablet (50 mg total) by mouth daily. 10/16/12   Jamesetta Orleans Lawyer, PA-C  risperiDONE (RISPERDAL) 1 MG tablet Take 1 mg by mouth at bedtime.    Historical Provider, MD  tiZANidine (ZANAFLEX) 4 MG capsule Take 4 mg by mouth 3 (three) times daily.    Historical Provider, MD  topiramate (TOPAMAX) 25 MG tablet Take 100 mg by mouth 2 (two) times daily.    Historical Provider, MD  traZODone (DESYREL) 100 MG tablet Take 100 mg by mouth at bedtime.    Historical Provider, MD   Physical Exam: Filed Vitals:   11/01/12 1745 11/01/12 1800 11/01/12 1815 11/01/12 1930  BP: 98/63 95/62 95/59  97/69  Pulse: 59 58 57 50  Resp: 12 13 12 12   SpO2: 97% 98% 98% 99%    BP 97/69  Pulse 50  Resp 12  SpO2 99%  General Appearance:    lethargic appearing, appears stated age, she does have her make up on.  Head:    Normocephalic, without obvious abnormality, atraumatic           Throat:   Lips, mucosa, and tongue normal; teeth and gums normal  Neck:   Supple, symmetrical, trachea midline, no adenopathy;    thyroid:  no enlargement/tenderness/nodules; no carotid   bruit or JVD  Back:     Symmetric, no curvature, ROM normal, no CVA tenderness  Lungs:     Clear to auscultation bilaterally, respirations unlabored  Chest Wall:    No tenderness or deformity   Heart:    Regular rate and  rhythm, S1 and S2 normal, no murmur, rub   or gallop              Extremities:   Extremities normal, atraumatic, no cyanosis or edema  Pulses:   2+ and symmetric all extremities  Skin:   Skin color, texture, turgor normal, no rashes or lesions  Lymph nodes:   Cervical, supraclavicular, and axillary nodes normal  Neurologic:   extraocular movements are intact, no facial symmetry in the morning for 4 extremities with noci stimuli.    Labs on Admission:  Basic Metabolic Panel:  Recent Labs Lab 11/01/12 1745  NA 140  K 3.2*  CL 106  CO2 23  GLUCOSE 189*  BUN 12  CREATININE 0.83  CALCIUM 9.1   Liver  Function Tests:  Recent Labs Lab 11/01/12 1745  AST 10  ALT 6  ALKPHOS 63  BILITOT 0.2*  PROT 6.9  ALBUMIN 3.3*   No results found for this basename: LIPASE, AMYLASE,  in the last 168 hours No results found for this basename: AMMONIA,  in the last 168 hours CBC:  Recent Labs Lab 11/01/12 1745  WBC 7.6  NEUTROABS 5.2  HGB 11.5*  HCT 35.3*  MCV 83.6  PLT 212   Cardiac Enzymes: No results found for this basename: CKTOTAL, CKMB, CKMBINDEX, TROPONINI,  in the last 168 hours  BNP (last 3 results) No results found for this basename: PROBNP,  in the last 8760 hours CBG: No results found for this basename: GLUCAP,  in the last 168 hours  Radiological Exams on Admission: Ct Head Wo Contrast  11/01/2012   *RADIOLOGY REPORT*  Clinical Data: Lethargy.  Ingestion of sleep and fills.  CT HEAD WITHOUT CONTRAST  Technique:  Contiguous axial images were obtained from the base of the skull through the vertex without contrast.  Comparison: CT head without contrast 05/05/2011  Findings: No acute cortical infarct, hemorrhage, mass lesion is present.  The ventricles are of normal size.  No significant extra- axial fluid collection is present.  The paranasal sinuses and mastoid air cells are clear.  The osseous skull is intact.  IMPRESSION: Negative CT of the head.   Original Report  Authenticated By: Marin Roberts, M.D.    EKG: Independently reviewed. Sinus rhythm, normal axis no ST segment depression  Assessment/Plan Drug overdose, multiple drugs/Lethargy - She is clearly overdosed, I have asked her if she took one extra pill or medication and she relates she only took one pill. I am not convinced this is the story as to why we she'll drive herself to the emergency room. She initially told the emergency room nurse that she tried to commit suicide with risperidone, she has multiple medication on her med rec there are sleeping agents and is concerning that she has 2 benzodiazepines, Benadryl, oxycodone, trazodone and gabapentin also Topamax which can cause a depressed mental status. She is also on 2 NSAIDs, which is concerning as I don't think she quite understands her regimen. - Will go ahead and stop most of these psychotropic medications until she is more awake and can confirm these medications. We'll go ahead and continue her Ativan when necessary. - Also got a psychiatry consult in the morning, admitted to the telemetry unit repeat an EKG in the morning to measure her QTC, her QTC on 11/02/2010 and 14 units 448.  GERD (gastroesophageal reflux disease) - Continue PPI   Hypokalemia: - Repeat check a mag    Code Status: full Family Communication: none Disposition Plan: Observation  Time spent: 70 minutes  Marinda Elk [facility] Pager [phone]  If 7PM-7AM, please contact night-coverage [email or web address] Password [email or web address] 11/01/2012, 8:30 PM

## 2012-11-01 NOTE — ED Notes (Signed)
Pt presents lethargic- ingestion of sleeping pills unknown amount

## 2012-11-01 NOTE — ED Provider Notes (Signed)
History     CSN: [id number] Arrival date & time 11/01/12  1659  First MD Initiated Contact with Patient 11/01/12 1706      Chief Complaint  Patient presents with  . Medical Clearance  . Ingestion   Level V caveat: Altered mental status HPI Patient presents to the emergency room after a probable drug overdose. The patient is very lethargic now and I am unable to get any help history. Patient however when she first arrived to the emergency room cold triage staff that she took an overdose of one of her pills that begins with an R. as a suicide attempt. Patient had left a friend's house. She took an unknown amount of these pills and then drove herself to the emergency room.  The patient wakes up and mumbles with stimuli at this time but she's not able to give me any other information. Past Medical History  Diagnosis Date  . Asthma   . GERD (gastroesophageal reflux disease)   . Grave's disease   . Fibromyalgia   . Environmental allergies   . Depression     Past Surgical History  Procedure Laterality Date  . Abdominal hysterectomy      partial  . Gastric bypass  2008    Family History  Problem Relation Age of Onset  . Hypertension Mother   . Cirrhosis Mother   . Hypertension Sister   . Diabetes Other   . Hypertension Other   . Diabetes Maternal Uncle   . Hypertension Father     History  Substance Use Topics  . Smoking status: Never Smoker   . Smokeless tobacco: Not on file  . Alcohol Use: No    OB History   Grav Para Term Preterm Abortions TAB SAB Ect Mult Living   2 2        2       Review of Systems  All other systems reviewed and are negative.    Allergies  Mushroom ext cmplx(shiitake-reishi-mait); Nutritional supplements; Shellfish allergy; and Hydrocodone-acetaminophen  Home Medications   Current Outpatient Rx  Name  Route  Sig  Dispense  Refill  . albuterol (PROVENTIL HFA;VENTOLIN HFA) 108 (90 BASE) MCG/ACT inhaler   Inhalation   Inhale 2 puffs into  the lungs every 6 (six) hours as needed for wheezing.         Marland Kitchen ALPRAZolam (XANAX) 0.5 MG tablet   Oral   Take 0.5 mg by mouth 2 (two) times daily as needed for sleep.         Marland Kitchen azelastine (ASTELIN) 137 MCG/SPRAY nasal spray   Nasal   Place 1 spray into the nose 2 (two) times daily. Use in each nostril as directed         . Biotin 5000 MCG TABS   Oral   Take by mouth.         . calcium carbonate (TUMS - DOSED IN MG ELEMENTAL CALCIUM) 500 MG chewable tablet   Oral   Chew 3 tablets by mouth daily.         . Cholecalciferol (VITAMIN D) 2000 UNITS CAPS   Oral   Take by mouth.         . diazepam (VALIUM) 5 MG tablet   Oral   Take 1 tablet (5 mg total) by mouth every 8 (eight) hours as needed (Muscle spasm).   12 tablet   0   . DiphenhydrAMINE HCl, Sleep, 50 MG CAPS   Oral   Take by mouth.         Marland Kitchen  Epinastine HCl (ELESTAT) 0.05 % ophthalmic solution   Both Eyes   Place 1 drop into both eyes 2 (two) times daily.         Marland Kitchen EPINEPHrine (EPIPEN) 0.3 mg/0.3 mL DEVI   Intramuscular   Inject 0.3 mg into the muscle as needed. For allergic reaction to stinging insects, mushrooms, and shellfish          . estradiol (ESTRACE) 0.5 MG tablet   Oral   Take 0.5 mg by mouth daily.         Marland Kitchen gabapentin (NEURONTIN) 300 MG capsule   Oral   Take 600 mg by mouth 4 (four) times daily.          Marland Kitchen ibuprofen (ADVIL,MOTRIN) 200 MG tablet   Oral   Take 800 mg by mouth every 8 (eight) hours as needed for pain.         . meloxicam (MOBIC) 7.5 MG tablet   Oral   Take 7.5 mg by mouth 2 (two) times daily.          . mometasone (NASONEX) 50 MCG/ACT nasal spray   Nasal   Place 2 sprays into the nose daily.         . mometasone-formoterol (DULERA) 100-5 MCG/ACT AERO   Inhalation   Inhale 2 puffs into the lungs 2 (two) times daily.           Marland Kitchen oxyCODONE-acetaminophen (PERCOCET/ROXICET) 5-325 MG per tablet   Oral   Take 1 tablet by mouth every 6 (six) hours as  needed for pain.   15 tablet   0   . predniSONE (DELTASONE) 50 MG tablet   Oral   Take 1 tablet (50 mg total) by mouth daily.   5 tablet   0   . risperiDONE (RISPERDAL) 1 MG tablet   Oral   Take 1 mg by mouth at bedtime.         Marland Kitchen tiZANidine (ZANAFLEX) 4 MG capsule   Oral   Take 4 mg by mouth 3 (three) times daily.         Marland Kitchen topiramate (TOPAMAX) 25 MG tablet   Oral   Take 100 mg by mouth 2 (two) times daily.         . traZODone (DESYREL) 100 MG tablet   Oral   Take 100 mg by mouth at bedtime.           BP 97/69  Pulse 50  Resp 12  SpO2 99%  Physical Exam  Nursing note and vitals reviewed. Constitutional: She appears well-developed and well-nourished. She appears lethargic.  HENT:  Head: Normocephalic and atraumatic.  Right Ear: External ear normal.  Left Ear: External ear normal.  Mouth/Throat: No oropharyngeal exudate.  Eyes: Conjunctivae are normal. Right eye exhibits no discharge. Left eye exhibits no discharge. No scleral icterus.  Pupils small, 1-2 mm bilaterally  Neck: Neck supple. No tracheal deviation present.  Cardiovascular: Normal rate, regular rhythm and intact distal pulses.   Pulmonary/Chest: Effort normal and breath sounds normal. No stridor. No respiratory distress. She has no wheezes. She has no rales.  Abdominal: Soft. Bowel sounds are normal. She exhibits no distension. There is no tenderness. There is no rebound and no guarding.  Musculoskeletal: She exhibits no edema and no tenderness.  Neurological: She has normal strength. She appears lethargic. She displays no tremor. No cranial nerve deficit ( no gross defecits noted) or sensory deficit. She exhibits normal muscle tone. She displays no seizure activity. Coordination  abnormal. GCS eye subscore is 3. GCS verbal subscore is 4. GCS motor subscore is 5.  Reflex Scores:      Bicep reflexes are 2+ on the right side and 2+ on the left side.      Patellar reflexes are 2+ on the right side and  2+ on the left side.      Achilles reflexes are 2+ on the right side and 2+ on the left side. Slurred speech   Skin: Skin is warm and dry. No rash noted.  Psychiatric: She has a normal mood and affect.    ED Course  Procedures (including critical care time) EKG Normal sinus rhythm rate 66 Nonspecific T wave changes Normal intervals, normal axis No significant change when compared to prior EKG dated 05/14/2011  7:56 PM Pt remains lethargic.  She responds to voice and stimuli but still is very lethargic and can't tell me what happened.  Speech is very hard to understand.  Labs Reviewed  CBC WITH DIFFERENTIAL - Abnormal; Notable for the following:    Hemoglobin 11.5 (*)    HCT 35.3 (*)    All other components within normal limits  COMPREHENSIVE METABOLIC PANEL - Abnormal; Notable for the following:    Potassium 3.2 (*)    Glucose, Bld 189 (*)    Albumin 3.3 (*)    Total Bilirubin 0.2 (*)    GFR calc non Af Amer 81 (*)    All other components within normal limits  URINALYSIS, ROUTINE W REFLEX MICROSCOPIC - Abnormal; Notable for the following:    APPearance CLOUDY (*)    Bilirubin Urine SMALL (*)    All other components within normal limits  URINE RAPID DRUG SCREEN (HOSP PERFORMED) - Abnormal; Notable for the following:    Amphetamines POSITIVE (*)    All other components within normal limits  APTT  PROTIME-INR  ETHANOL  TYPE AND SCREEN  ABO/RH   Ct Head Wo Contrast  11/01/2012   *RADIOLOGY REPORT*  Clinical Data: Lethargy.  Ingestion of sleep and fills.  CT HEAD WITHOUT CONTRAST  Technique:  Contiguous axial images were obtained from the base of the skull through the vertex without contrast.  Comparison: CT head without contrast 05/05/2011  Findings: No acute cortical infarct, hemorrhage, mass lesion is present.  The ventricles are of normal size.  No significant extra- axial fluid collection is present.  The paranasal sinuses and mastoid air cells are clear.  The osseous  skull is intact.  IMPRESSION: Negative CT of the head.   Original Report Authenticated By: Marin Roberts, M.D.     1. Altered mental status   2. Drug overdose, initial encounter       MDM  Pt presents with altered mental status.  Likely related to a drug overdose.  Poison center was contacted.  Recommend supportive care.  She has had some borderline blood pressures but remains stable at this time.  Will admit for further monitoring.  At this time does not require blood pressure support or airway protection.       Celene Kras, MD 11/01/12 [id number]

## 2012-11-02 DIAGNOSIS — K219 Gastro-esophageal reflux disease without esophagitis: Secondary | ICD-10-CM

## 2012-11-02 DIAGNOSIS — F329 Major depressive disorder, single episode, unspecified: Secondary | ICD-10-CM

## 2012-11-02 DIAGNOSIS — G8929 Other chronic pain: Secondary | ICD-10-CM

## 2012-11-02 DIAGNOSIS — J45909 Unspecified asthma, uncomplicated: Secondary | ICD-10-CM

## 2012-11-02 LAB — COMPREHENSIVE METABOLIC PANEL
ALT: 6 U/L (ref 0–35)
AST: 7 U/L (ref 0–37)
Albumin: 2.9 g/dL — ABNORMAL LOW (ref 3.5–5.2)
Alkaline Phosphatase: 55 U/L (ref 39–117)
BUN: 13 mg/dL (ref 6–23)
CO2: 24 mEq/L (ref 19–32)
Calcium: 8.5 mg/dL (ref 8.4–10.5)
Chloride: 110 mEq/L (ref 96–112)
Creatinine, Ser: 0.79 mg/dL (ref 0.50–1.10)
GFR calc Af Amer: >90 mL/min (ref >90)
GFR calc non Af Amer: >90 mL/min (ref >90)
Glucose, Bld: 90 mg/dL (ref 70–99)
Potassium: 3.2 mEq/L — ABNORMAL LOW (ref 3.5–5.1)
Sodium: 143 mEq/L (ref 135–145)
Total Bilirubin: 0.1 mg/dL — ABNORMAL LOW (ref 0.3–1.2)
Total Protein: 5.7 g/dL — ABNORMAL LOW (ref 6.0–8.3)

## 2012-11-02 LAB — CBC
HCT: 31.1 % — ABNORMAL LOW (ref 36.0–46.0)
Hemoglobin: 10 g/dL — ABNORMAL LOW (ref 12.0–15.0)
MCH: 27 pg (ref 26.0–34.0)
MCHC: 32.2 g/dL (ref 30.0–36.0)
MCV: 84.1 fL (ref 78.0–100.0)
Platelets: 186 10*3/uL (ref 150–400)
RBC: 3.7 MIL/uL — ABNORMAL LOW (ref 3.87–5.11)
RDW: 13.1 % (ref 11.5–15.5)
WBC: 5.7 10*3/uL (ref 4.0–10.5)

## 2012-11-02 LAB — ACETAMINOPHEN LEVEL: Acetaminophen (Tylenol), Serum: <15 ug/mL (ref 10–30)

## 2012-11-02 MED ORDER — POTASSIUM CHLORIDE CRYS ER 20 MEQ PO TBCR
40.0000 meq | EXTENDED_RELEASE_TABLET | Freq: Once | ORAL | Status: AC
  Administered 2012-11-02: 40 meq via ORAL
  Filled 2012-11-02: qty 2

## 2012-11-02 MED ORDER — ENSURE COMPLETE PO LIQD
237.0000 mL | Freq: Two times a day (BID) | ORAL | Status: DC
  Administered 2012-11-03: 237 mL via ORAL

## 2012-11-02 NOTE — Progress Notes (Signed)
[facility] PROGRESS NOTE  [name] RUE:[id number] DOB: 1966-01-20 DOA: 11/01/2012 PCP: Dartha Lodge, FNP  Assessment/Plan: 1. Drug overdose multiple drugs - At this point patient is alert and oriented - Is resolving with IVF's - AST/ALT within normal limits - Creatinine within normal limits  2. SI - contacted Psychiatry for further evaluation and recommendations regarding disposition from their standpoint as patient reports taking multiple medications with the intent to harm herself based on our discussion.  3. GERD - stable on tums  4. Hypokalemia - will replace orally and reassess next am.  Code Status: full Family Communication: no family at bedside.  Disposition Plan: Pending evaluation from psychiatry.   Consultants:  Psychiatry  Procedures:  none  Antibiotics:  None  HPI/Subjective: Patient with no new complaints.  States that she took the medication because she wanted to sleep as she was tired of all her social problems.  Objective: Filed Vitals:   11/01/12 2227 11/02/12 0000 11/02/12 0200 11/02/12 0525  BP:    92/50  Pulse:    61  Temp: 96.6 F (35.9 C) 96.8 F (36 C) 97 F (36.1 C) 98.2 F (36.8 C)  TempSrc: Rectal Rectal Rectal Oral  Resp:    12  Height:      Weight:      SpO2:    100%    Intake/Output Summary (Last 24 hours) at 11/02/12 1250 Last data filed at 11/02/12 0600  Gross per 24 hour  Intake 1529.17 ml  Output      0 ml  Net 1529.17 ml   Filed Weights   11/01/12 2226  Weight: 89.041 kg (196 lb 4.8 oz)    Exam:   General:  Pt in NAD, Alert and Awake  Cardiovascular: RRR, No mrg  Respiratory: CTA BL, no wheezes  Abdomen: soft, NT, ND  Musculoskeletal: no cyanosis or clubbing   Data Reviewed: Basic Metabolic Panel:  Recent Labs Lab 11/01/12 1745 11/02/12 0525  NA 140 143  K 3.2* 3.2*  CL 106 110  CO2 23 24  GLUCOSE 189* 90  BUN 12 13  CREATININE 0.83 0.79  CALCIUM 9.1 8.5   Liver  Function Tests:  Recent Labs Lab 11/01/12 1745 11/02/12 0525  AST 10 7  ALT 6 6  ALKPHOS 63 55  BILITOT 0.2* 0.1*  PROT 6.9 5.7*  ALBUMIN 3.3* 2.9*   No results found for this basename: LIPASE, AMYLASE,  in the last 168 hours No results found for this basename: AMMONIA,  in the last 168 hours CBC:  Recent Labs Lab 11/01/12 1745 11/02/12 0525  WBC 7.6 5.7  NEUTROABS 5.2  --   HGB 11.5* 10.0*  HCT 35.3* 31.1*  MCV 83.6 84.1  PLT 212 186   Cardiac Enzymes: No results found for this basename: CKTOTAL, CKMB, CKMBINDEX, TROPONINI,  in the last 168 hours BNP (last 3 results) No results found for this basename: PROBNP,  in the last 8760 hours CBG: No results found for this basename: GLUCAP,  in the last 168 hours  No results found for this or any previous visit (from the past 240 hour(s)).   Studies: Ct Head Wo Contrast  11/01/2012   *RADIOLOGY REPORT*  Clinical Data: Lethargy.  Ingestion of sleep and fills.  CT HEAD WITHOUT CONTRAST  Technique:  Contiguous axial images were obtained from the base of the skull through the vertex without contrast.  Comparison: CT head without contrast 05/05/2011  Findings: No acute cortical infarct, hemorrhage, mass lesion is present.  The ventricles are of normal size.  No significant extra- axial fluid collection is present.  The paranasal sinuses and mastoid air cells are clear.  The osseous skull is intact.  IMPRESSION: Negative CT of the head.   Original Report Authenticated By: Marin Roberts, M.D.    Scheduled Meds: . sodium chloride   Intravenous STAT  . calcium carbonate  3 tablet Oral Daily  . estradiol  0.5 mg Oral Daily  . heparin  5,000 Units Subcutaneous Q8H  . olopatadine  1 drop Both Eyes BID  . risperiDONE  1 mg Oral QHS  . sodium chloride  3 mL Intravenous Q12H  . tiZANidine  4 mg Oral TID  . topiramate  100 mg Oral BID   Continuous Infusions: . sodium chloride 1,000 mL (11/02/12 0452)    Principal Problem:    Drug overdose, multiple drugs Active Problems:   GERD (gastroesophageal reflux disease)   Lethargy   Hypokalemia    Time spent: > 35 minutes    Penny [name]  [facility] Pager [phone] If 7PM-7AM, please contact night-coverage at [email or web address], password [facility] 11/02/2012, 12:50 PM  LOS: 1 day

## 2012-11-02 NOTE — Progress Notes (Signed)
INITIAL NUTRITION ASSESSMENT  Pt meets criteria for severe MALNUTRITION in the context of chronic illness as evidenced by <75% estimated energy intake with 16.9% weight loss in the past 5 months per pt report.  DOCUMENTATION CODES Per approved criteria  -Severe malnutrition in the context of chronic illness   INTERVENTION: - Ensure Complete BID - Encouraged small frequent meals to improve appetite - Spiritual care consult for suicidal thoughts - Will continue to monitor   NUTRITION DIAGNOSIS: Unintended weight loss related to poor appetite as evidenced by pt report.   Goal: Pt to consume >90% of meals/supplements  Monitor:  Weights, labs, intake  Reason for Assessment: Nutrition risk   47 y.o. female  Admitting Dx: Drug overdose, multiple drugs  ASSESSMENT: Pt admitted with drug overdose as suicide attempt. Met with pt who had sitter at bedside. Pt reports eating only every other day PTA due to having no appetite. Pt reports 40 pound unintended weight loss in the past 5 months. Pt reports she drinks a protein shake like Ensure daily. Pt reports eating a good breakfast.   Height: Ht Readings from Last 1 Encounters:  11/01/12 5\' 6"  (1.676 m)    Weight: Wt Readings from Last 1 Encounters:  11/01/12 196 lb 4.8 oz (89.041 kg)    Ideal Body Weight: 130 lb  % Ideal Body Weight: 151  Wt Readings from Last 10 Encounters:  11/01/12 196 lb 4.8 oz (89.041 kg)  11/22/11 202 lb (91.627 kg)  08/13/08 210 lb (95.255 kg)    Usual Body Weight: 236 lb per pt  % Usual Body Weight: 83  BMI:  Body mass index is 31.7 kg/(m^2). Class I obesity  Estimated Nutritional Needs: Kcal: 1500-1650 Protein: 60-70g Fluid: 1.5-1.6L/day  Skin: Intact   Diet Order: General  EDUCATION NEEDS: -No education needs identified at this time   Intake/Output Summary (Last 24 hours) at 11/02/12 1603 Last data filed at 11/02/12 1438  Gross per 24 hour  Intake 2009.17 ml  Output      0 ml   Net 2009.17 ml    Last BM: PTA  Labs:   Recent Labs Lab 11/01/12 1745 11/02/12 0525  NA 140 143  K 3.2* 3.2*  CL 106 110  CO2 23 24  BUN 12 13  CREATININE 0.83 0.79  CALCIUM 9.1 8.5  GLUCOSE 189* 90    CBG (last 3)  No results found for this basename: GLUCAP,  in the last 72 hours  Scheduled Meds: . sodium chloride   Intravenous STAT  . calcium carbonate  3 tablet Oral Daily  . estradiol  0.5 mg Oral Daily  . heparin  5,000 Units Subcutaneous Q8H  . olopatadine  1 drop Both Eyes BID  . risperiDONE  1 mg Oral QHS  . sodium chloride  3 mL Intravenous Q12H  . tiZANidine  4 mg Oral TID  . topiramate  100 mg Oral BID    Continuous Infusions: . sodium chloride 1,000 mL (11/02/12 1435)    Past Medical History  Diagnosis Date  . Asthma   . GERD (gastroesophageal reflux disease)   . Grave's disease   . Fibromyalgia   . Environmental allergies   . Depression     Past Surgical History  Procedure Laterality Date  . Abdominal hysterectomy      partial  . Gastric bypass  [address] [place] [place], [place] [phone] Pager [phone] After Hours Pager

## 2012-11-02 NOTE — Consult Note (Signed)
Reason for Consult: depression and intentional overdose of medication for insomnia Referring Physician: Dr. Forestine Na is an 47 y.o. female.  HPI: Patient stated that she took additional prescription pills because she could not sleep with multiple psychosocial stresses and financial problems over three years. She has been grieving her mom's death. Her son was in college and she lives by herself. She has no job, and worried about loosing her home and car. She has settled the case of workman compensation with [employer] regarding work environment causing allergy due to allergens and mildew. She has symptoms of depression, mania and passive suicidal thoughts without intention or plans. She has been looking forward to find a job and helping other people. She endorsing her psychiatrist titrated up her medication risperidone about four days ago and still could not sleep. She stated that she realized that she has taken more pills and than drove herself to the emergency room and seeking medical help. She contract for safety and willing to participate in out patient psychiatric services as she has established relationship with practice. She has one previous psych admission at [facility] in 10/07/08, after death of her mother for cirrhosis of liver.   MSE: She is calm and cooperative. She has decreased psychomotor activity and dysphoric mood when discussed about her psychosocial and financial problems. She denied suicidal attempt because of strong bonding with her son and strong belief that suicide - you may not to go to heaven. She has denied SI/HI and no evidence of psychosis. She has fair insight, judgment and impulse control.   Past Medical History  Diagnosis Date  . Asthma   . GERD (gastroesophageal reflux disease)   . Grave's disease   . Fibromyalgia   . Environmental allergies   . Depression     Past Surgical History  Procedure Laterality Date  . Abdominal hysterectomy      partial  . Gastric bypass   Oct 08, 2006    Family History  Problem Relation Age of Onset  . Hypertension Mother   . Cirrhosis Mother   . Hypertension Sister   . Diabetes Other   . Hypertension Other   . Diabetes Maternal Uncle   . Hypertension Father     Social History:  reports that she has never smoked. She does not have any smokeless tobacco history on file. She reports that she does not drink alcohol or use illicit drugs.  Allergies:  Allergies  Allergen Reactions  . Mushroom Ext Cmplx(Shiitake-Reishi-Mait) Anaphylaxis  . Nutritional Supplements Anaphylaxis    And other stinging insects  . Shellfish Allergy Anaphylaxis  . Hydrocodone-Acetaminophen Itching    Medications: I have reviewed the patient's current medications.  Results for orders placed during the hospital encounter of 11/01/12 (from the past 48 hour(s))  APTT     Status: None   Collection Time    11/01/12  5:45 PM      Result Value Range   aPTT 28  24 - 37 seconds  CBC WITH DIFFERENTIAL     Status: Abnormal   Collection Time    11/01/12  5:45 PM      Result Value Range   WBC 7.6  4.0 - 10.5 K/uL   RBC 4.22  3.87 - 5.11 MIL/uL   Hemoglobin 11.5 (*) 12.0 - 15.0 g/dL   HCT 86.5 (*) 78.4 - 69.6 %   MCV 83.6  78.0 - 100.0 fL   MCH 27.3  26.0 - 34.0 pg   MCHC 32.6  30.0 - 36.0 g/dL   RDW 16.1  09.6 - 04.5 %   Platelets 212  150 - 400 K/uL   Neutrophils Relative % 68  43 - 77 %   Neutro Abs 5.2  1.7 - 7.7 K/uL   Lymphocytes Relative 21  12 - 46 %   Lymphs Abs 1.6  0.7 - 4.0 K/uL   Monocytes Relative 10  3 - 12 %   Monocytes Absolute 0.8  0.1 - 1.0 K/uL   Eosinophils Relative 1  0 - 5 %   Eosinophils Absolute 0.0  0.0 - 0.7 K/uL   Basophils Relative 0  0 - 1 %   Basophils Absolute 0.0  0.0 - 0.1 K/uL  COMPREHENSIVE METABOLIC PANEL     Status: Abnormal   Collection Time    11/01/12  5:45 PM      Result Value Range   Sodium 140  135 - 145 mEq/L   Potassium 3.2 (*) 3.5 - 5.1 mEq/L   Chloride 106  96 - 112 mEq/L   CO2 23  19 - 32  mEq/L   Glucose, Bld 189 (*) 70 - 99 mg/dL   BUN 12  6 - 23 mg/dL   Creatinine, Ser 4.09  0.50 - 1.10 mg/dL   Calcium 9.1  8.4 - 81.1 mg/dL   Total Protein 6.9  6.0 - 8.3 g/dL   Albumin 3.3 (*) 3.5 - 5.2 g/dL   AST 10  0 - 37 U/L   ALT 6  0 - 35 U/L   Alkaline Phosphatase 63  39 - 117 U/L   Total Bilirubin 0.2 (*) 0.3 - 1.2 mg/dL   GFR calc non Af Amer 81 (*) >90 mL/min   GFR calc Af Amer >90  >90 mL/min   Comment:            The eGFR has been calculated     using the CKD EPI equation.     This calculation has not been     validated in all clinical     situations.     eGFR's persistently     <90 mL/min signify     possible Chronic Kidney Disease.  PROTIME-INR     Status: None   Collection Time    11/01/12  5:45 PM      Result Value Range   Prothrombin Time 13.7  11.6 - 15.2 seconds   INR 1.06  0.00 - 1.49  TYPE AND SCREEN     Status: None   Collection Time    11/01/12  5:45 PM      Result Value Range   ABO/RH(D) A POS     Antibody Screen NEG     Sample Expiration 11/04/2012    ETHANOL     Status: None   Collection Time    11/01/12  5:45 PM      Result Value Range   Alcohol, Ethyl (B) <11  0 - 11 mg/dL   Comment:            LOWEST DETECTABLE LIMIT FOR     SERUM ALCOHOL IS 11 mg/dL     FOR MEDICAL PURPOSES ONLY  ABO/RH     Status: None   Collection Time    11/01/12  5:45 PM      Result Value Range   ABO/RH(D) A POS    URINALYSIS, ROUTINE W REFLEX MICROSCOPIC     Status: Abnormal   Collection Time    11/01/12  5:57  PM      Result Value Range   Color, Urine YELLOW  YELLOW   APPearance CLOUDY (*) CLEAR   Specific Gravity, Urine 1.026  1.005 - 1.030   pH 6.0  5.0 - 8.0   Glucose, UA NEGATIVE  NEGATIVE mg/dL   Hgb urine dipstick NEGATIVE  NEGATIVE   Bilirubin Urine SMALL (*) NEGATIVE   Ketones, ur NEGATIVE  NEGATIVE mg/dL   Protein, ur NEGATIVE  NEGATIVE mg/dL   Urobilinogen, UA 1.0  0.0 - 1.0 mg/dL   Nitrite NEGATIVE  NEGATIVE   Leukocytes, UA NEGATIVE   NEGATIVE   Comment: MICROSCOPIC NOT DONE ON URINES WITH NEGATIVE PROTEIN, BLOOD, LEUKOCYTES, NITRITE, OR GLUCOSE <1000 mg/dL.  URINE RAPID DRUG SCREEN (HOSP PERFORMED)     Status: Abnormal   Collection Time    11/01/12  5:57 PM      Result Value Range   Opiates NONE DETECTED  NONE DETECTED   Cocaine NONE DETECTED  NONE DETECTED   Benzodiazepines NONE DETECTED  NONE DETECTED   Amphetamines POSITIVE (*) NONE DETECTED   Tetrahydrocannabinol NONE DETECTED  NONE DETECTED   Barbiturates NONE DETECTED  NONE DETECTED   Comment:            DRUG SCREEN FOR MEDICAL PURPOSES     ONLY.  IF CONFIRMATION IS NEEDED     FOR ANY PURPOSE, NOTIFY LAB     WITHIN 5 DAYS.                LOWEST DETECTABLE LIMITS     FOR URINE DRUG SCREEN     Drug Class       Cutoff (ng/mL)     Amphetamine      1000     Barbiturate      200     Benzodiazepine   200     Tricyclics       300     Opiates          300     Cocaine          300     THC              50  ACETAMINOPHEN LEVEL     Status: None   Collection Time    11/02/12  4:20 AM      Result Value Range   Acetaminophen (Tylenol), Serum <15.0  10 - 30 ug/mL   Comment:            THERAPEUTIC CONCENTRATIONS VARY     SIGNIFICANTLY. A RANGE OF 10-30     ug/mL MAY BE AN EFFECTIVE     CONCENTRATION FOR MANY PATIENTS.     HOWEVER, SOME ARE BEST TREATED     AT CONCENTRATIONS OUTSIDE THIS     RANGE.     ACETAMINOPHEN CONCENTRATIONS     >150 ug/mL AT 4 HOURS AFTER     INGESTION AND >50 ug/mL AT 12     HOURS AFTER INGESTION ARE     OFTEN ASSOCIATED WITH TOXIC     REACTIONS.  COMPREHENSIVE METABOLIC PANEL     Status: Abnormal   Collection Time    11/02/12  5:25 AM      Result Value Range   Sodium 143  135 - 145 mEq/L   Potassium 3.2 (*) 3.5 - 5.1 mEq/L   Chloride 110  96 - 112 mEq/L   CO2 24  19 - 32 mEq/L   Glucose, Bld 90  70 -  99 mg/dL   BUN 13  6 - 23 mg/dL   Creatinine, Ser 1.61  0.50 - 1.10 mg/dL   Calcium 8.5  8.4 - 09.6 mg/dL   Total Protein 5.7  (*) 6.0 - 8.3 g/dL   Albumin 2.9 (*) 3.5 - 5.2 g/dL   AST 7  0 - 37 U/L   ALT 6  0 - 35 U/L   Alkaline Phosphatase 55  39 - 117 U/L   Total Bilirubin 0.1 (*) 0.3 - 1.2 mg/dL   GFR calc non Af Amer >90  >90 mL/min   GFR calc Af Amer >90  >90 mL/min   Comment:            The eGFR has been calculated     using the CKD EPI equation.     This calculation has not been     validated in all clinical     situations.     eGFR's persistently     <90 mL/min signify     possible Chronic Kidney Disease.  CBC     Status: Abnormal   Collection Time    11/02/12  5:25 AM      Result Value Range   WBC 5.7  4.0 - 10.5 K/uL   RBC 3.70 (*) 3.87 - 5.11 MIL/uL   Hemoglobin 10.0 (*) 12.0 - 15.0 g/dL   HCT 04.5 (*) 40.9 - 81.1 %   MCV 84.1  78.0 - 100.0 fL   MCH 27.0  26.0 - 34.0 pg   MCHC 32.2  30.0 - 36.0 g/dL   RDW 91.4  78.2 - 95.6 %   Platelets 186  150 - 400 K/uL    Ct Head Wo Contrast  11/01/2012   *RADIOLOGY REPORT*  Clinical Data: Lethargy.  Ingestion of sleep and fills.  CT HEAD WITHOUT CONTRAST  Technique:  Contiguous axial images were obtained from the base of the skull through the vertex without contrast.  Comparison: CT head without contrast 05/05/2011  Findings: No acute cortical infarct, hemorrhage, mass lesion is present.  The ventricles are of normal size.  No significant extra- axial fluid collection is present.  The paranasal sinuses and mastoid air cells are clear.  The osseous skull is intact.  IMPRESSION: Negative CT of the head.   Original Report Authenticated By: Marin Roberts, M.D.    Positive for anxiety, bipolar, depression, mood swings and sleep disturbance Blood pressure 92/50, pulse 61, temperature 98.2 F (36.8 C), temperature source Oral, resp. rate 12, height 5\' 6"  (1.676 m), weight 89.041 kg (196 lb 4.8 oz), SpO2 100.00%.   Assessment/Plan: Bipolar disorder, MRE is depression  Recommendation:  1. Patient does not meet criteria for acute psychiatric  hospitalization and has no safety concerns at this time 2. Patient will be referred to  Out patient psychiatric care at [facility] 3. Recommended no medication changes at this time 4. Avoid excessive medication and follow the prescription instructions and contact PCP if needs assistance 5. Appreciate psych consultation services and will sign off at this time.   Maebry Obrien,JANARDHAHA R. 11/02/2012, 11:59 AM

## 2012-11-03 DIAGNOSIS — T50901S Poisoning by unspecified drugs, medicaments and biological substances, accidental (unintentional), sequela: Secondary | ICD-10-CM

## 2012-11-03 DIAGNOSIS — T6591XS Toxic effect of unspecified substance, accidental (unintentional), sequela: Secondary | ICD-10-CM

## 2012-11-03 DIAGNOSIS — E41 Nutritional marasmus: Secondary | ICD-10-CM

## 2012-11-03 DIAGNOSIS — F3160 Bipolar disorder, current episode mixed, unspecified: Secondary | ICD-10-CM

## 2012-11-03 DIAGNOSIS — E43 Unspecified severe protein-calorie malnutrition: Secondary | ICD-10-CM | POA: Insufficient documentation

## 2012-11-03 LAB — BASIC METABOLIC PANEL
BUN: 14 mg/dL (ref 6–23)
CO2: 22 mEq/L (ref 19–32)
Calcium: 8.3 mg/dL — ABNORMAL LOW (ref 8.4–10.5)
Chloride: 113 mEq/L — ABNORMAL HIGH (ref 96–112)
Creatinine, Ser: 0.87 mg/dL (ref 0.50–1.10)
GFR calc Af Amer: 89 mL/min — ABNORMAL LOW (ref >90)
GFR calc non Af Amer: 77 mL/min — ABNORMAL LOW (ref >90)
Glucose, Bld: 92 mg/dL (ref 70–99)
Potassium: 3.5 mEq/L (ref 3.5–5.1)
Sodium: 142 mEq/L (ref 135–145)

## 2012-11-03 MED ORDER — SODIUM CHLORIDE 0.9 % IV BOLUS (SEPSIS)
500.0000 mL | Freq: Once | INTRAVENOUS | Status: AC
  Administered 2012-11-03: 500 mL via INTRAVENOUS

## 2012-11-03 MED ORDER — SODIUM CHLORIDE 0.9 % IV SOLN
1000.0000 mL | INTRAVENOUS | Status: DC
  Administered 2012-11-03 – 2012-11-04 (×3): 1000 mL via INTRAVENOUS

## 2012-11-03 NOTE — Progress Notes (Signed)
Clinical Social Work Department CLINICAL SOCIAL WORK PSYCHIATRY SERVICE LINE ASSESSMENT 11/03/2012  Patient:  [name]  Account:  [id number]  Admit Date:  11/01/2012  Clinical Social Worker:  Unk Lightning, LCSW  Date/Time:  11/03/2012 12:15 PM Referred by:  Physician  Date referred:  11/03/2012 Reason for Referral  Psychosocial assessment   Presenting Symptoms/Problems (In the person's/family's own words):   Psych consulted for SI.   Abuse/Neglect/Trauma History (check all that apply)  Denies history   Abuse/Neglect/Trauma Comments:   Patient denies any abuse.   Psychiatric History (check all that apply)  Outpatient treatment  Inpatient/hospitilization   Psychiatric medications:  Risperdal 1 mg   Current Mental Health Hospitalizations/Previous Mental Health History:   Patient reports she has been depressed for several years. Patient has felt more depressed since mother has passed away. Patient receives therapy and medication management currently.   Current provider:   [facility] [place]   Place and Date:   [place], [place]   Current Medications:   albuterol, ALPRAZolam, ibuprofen, ondansetron (ZOFRAN) IV, ondansetron, polyethylene glycol            . calcium carbonate  3 tablet Oral Daily  . estradiol  0.5 mg Oral Daily  . feeding supplement  237 mL Oral BID BM  . heparin  5,000 Units Subcutaneous Q8H  . olopatadine  1 drop Both Eyes BID  . risperiDONE  1 mg Oral QHS  . sodium chloride  3 mL Intravenous Q12H  . tiZANidine  4 mg Oral TID  . topiramate  100 mg Oral BID   Previous Impatient Admission/Date/Reason:   Patient was hospitalized at [facility] in 2010.   Emotional Health / Current Symptoms    Suicide/Self Harm  None reported   Suicide attempt in the past:   Patient denies that this admission was related to a suicide attempt. Patient denies any current SI or HI.   Other harmful behavior:   None reported   Psychotic/Dissociative Symptoms   None reported   Other Psychotic/Dissociative Symptoms:   Patient denies any psychotic features.    Attention/Behavioral Symptoms  Withdrawn   Other Attention / Behavioral Symptoms:   Patient minimally engaged with limited eye contact.    Cognitive Impairment  Within Normal Limits   Other Cognitive Impairment:    Mood and Adjustment  Flat    Stress, Anxiety, Trauma, Any Recent Loss/Stressor  Other - See comment   Anxiety (frequency):   N/A   Phobia (specify):   N/A   Compulsive behavior (specify):   N/A   Obsessive behavior (specify):   N/A   Other:   Patient reports she does not have a job and is stressed out about money.   Substance Abuse/Use  None   SBIRT completed (please refer for detailed history):  N  Self-reported substance use:   Patient denies all substance use.   Urinary Drug Screen Completed:  Y Alcohol level:   Positive for amphetamines.    Environmental/Housing/Living Arrangement  Stable housing   Who is in the home:   Alone   Emergency contact:  Thelma-sister   Financial  IPRS   Patient's Strengths and Goals (patient's own words):   Patient reports she is compliant with treatment.   Clinical Social Worker's Interpretive Summary:   CSW received referral to complete a psychosocial assessment on patient. Per chart review, psych MD recommends that patient follow up with [facility] [place] at [place].    CSW met with patient at  bedside. Patient laying in bed and watching TV. CSW introduced myself and explained role. Patient reports she accident took too many pills by mistake and was admitted to the hospital. Patient denies SI or HI and reports she is ready to DC home.    Patient reports she has been depressed for several years and still grieving the loss of the mother. Patient reports she has been going to therapy in order to deal with grief and has been taking medications. Patient reports she is compliant with treatment for  therapy and medications.    Patient minimally engaged with no eye contact. Patient speaks softly and replies with short answers. Patient speaks softly and does not elaborate when asked to provide more detail. Patient denies any resources or referrals from CSW and reports that she will be safe at DC.    CSW offered financial resources and referrals to the community but patient reports she will attend scheduled appointments at [facility] [place]. CSW is signing off but available if further needs arise.   Disposition:  Patient declines assistance

## 2012-11-03 NOTE — Progress Notes (Signed)
Chaplain consulted in response to spiritual care consult.  Introduced spiritual care as resource.  Pt expressed that she has no needs at this time.  Will continue to follow for support as needed.    Belva Crome  MDiv

## 2012-11-03 NOTE — Care Management Note (Signed)
    Page 1 of 1   11/03/2012     3:50:07 PM   CARE MANAGEMENT NOTE 11/03/2012  Patient:  [name], [name]   Account Number:  [id number]  Date Initiated:  11/03/2012  Documentation initiated by:  Lanier Clam  Subjective/Objective Assessment:   ADMITTED W/DRUG OVERDOSE.ZO:[id number]     Action/Plan:   FROM HOME,ALONE.   Anticipated DC Date:  11/04/2012   Anticipated DC Plan:  HOME/SELF CARE      DC Planning Services  CM consult      Choice offered to / List presented to:             Status of service:  In process, will continue to follow Medicare Important Message given?   (If response is "NO", the following Medicare IM given date fields will be blank) Date Medicare IM given:   Date Additional Medicare IM given:    Discharge Disposition:    Per UR Regulation:  Reviewed for med. necessity/level of care/duration of stay  If discussed at Long Length of Stay Meetings, dates discussed:    Comments:  11/03/12 Miking Usrey RN,BSN NCM [phone] PSYCH-OTPT REHAB.$4 [facility] MED LIST GIVEN.NO OTHER ANTICIPATED D/C NEEDS.

## 2012-11-03 NOTE — Progress Notes (Signed)
Took over pt's care at 3pm. I agree with the previous documented assessment this am. Pt has no complaints at this time. Will continue to monitor pt.   Arta Bruce [facility] 11/03/2012 4:25 PM

## 2012-11-03 NOTE — Progress Notes (Signed)
[facility] PROGRESS NOTE  [name]:[id number] DOB: 05-Mar-1966 DOA: 11/01/2012 PCP: Dartha Lodge, FNP  Assessment/Plan: 1. Drug overdose multiple drugs - At this point patient is alert and oriented - Is resolving with IVF's - AST/ALT within normal limits - Creatinine within normal limits  2. SI - Psychiatry and CSW have evaluated patient and signed off. Patient is to continue outpatient psych services for her psychiatric conditions.   - will d/c sitter. - Once medically stable will plan on discharging  3. GERD - stable on tums  4. Hypokalemia - will replace orally and reassess next am.  5. Malnutrition - Per nutritionist evaluation they report: Pt meets criteria for severe MALNUTRITION in the context of chronic illness as evidenced by <75% estimated energy intake with 16.9% weight loss in the past 5 months per pt report. Agree with the above statement - Continue Ensure complete BID - most likely contributing to lower blood pressures.  Code Status: full Family Communication: no family at bedside.  Disposition Plan: With improved blood pressures   Consultants:  Psychiatry  Procedures:  none  Antibiotics:  None  HPI/Subjective: Patient reports that when she stood up to go to the bathroom she felt dizzy.  Has had soft blood pressures.  She states that her appetite has been poor lately.   Objective: Filed Vitals:   11/02/12 1359 11/02/12 1405 11/02/12 2146 11/03/12 0608  BP: 83/49 90/52 114/65 104/65  Pulse: 75 79 78 60  Temp:   97.9 F (36.6 C) 97.5 F (36.4 C)  TempSrc:   Oral Oral  Resp:   16 16  Height:      Weight:      SpO2:   100% 100%    Intake/Output Summary (Last 24 hours) at 11/03/12 1409 Last data filed at 11/03/12 1100  Gross per 24 hour  Intake   2725 ml  Output   2500 ml  Net    225 ml   Filed Weights   11/01/12 2226  Weight: 89.041 kg (196 lb 4.8 oz)    Exam:   General:  Pt in NAD, Alert and  Awake  Cardiovascular: RRR, No mrg  Respiratory: CTA BL, no wheezes  Abdomen: soft, NT, ND  Musculoskeletal: no cyanosis or clubbing   Data Reviewed: Basic Metabolic Panel:  Recent Labs Lab 11/01/12 1745 11/02/12 0525 11/03/12 0530  NA 140 143 142  K 3.2* 3.2* 3.5  CL 106 110 113*  CO2 23 24 22   GLUCOSE 189* 90 92  BUN 12 13 14   CREATININE 0.83 0.79 0.87  CALCIUM 9.1 8.5 8.3*   Liver Function Tests:  Recent Labs Lab 11/01/12 1745 11/02/12 0525  AST 10 7  ALT 6 6  ALKPHOS 63 55  BILITOT 0.2* 0.1*  PROT 6.9 5.7*  ALBUMIN 3.3* 2.9*   No results found for this basename: LIPASE, AMYLASE,  in the last 168 hours No results found for this basename: AMMONIA,  in the last 168 hours CBC:  Recent Labs Lab 11/01/12 1745 11/02/12 0525  WBC 7.6 5.7  NEUTROABS 5.2  --   HGB 11.5* 10.0*  HCT 35.3* 31.1*  MCV 83.6 84.1  PLT 212 186   Cardiac Enzymes: No results found for this basename: CKTOTAL, CKMB, CKMBINDEX, TROPONINI,  in the last 168 hours BNP (last 3 results) No results found for this basename: PROBNP,  in the last 8760 hours CBG: No results found for this basename: GLUCAP,  in the last 168 hours  No results found  for this or any previous visit (from the past 240 hour(s)).   Studies: Ct Head Wo Contrast  11/01/2012   *RADIOLOGY REPORT*  Clinical Data: Lethargy.  Ingestion of sleep and fills.  CT HEAD WITHOUT CONTRAST  Technique:  Contiguous axial images were obtained from the base of the skull through the vertex without contrast.  Comparison: CT head without contrast 05/05/2011  Findings: No acute cortical infarct, hemorrhage, mass lesion is present.  The ventricles are of normal size.  No significant extra- axial fluid collection is present.  The paranasal sinuses and mastoid air cells are clear.  The osseous skull is intact.  IMPRESSION: Negative CT of the head.   Original Report Authenticated By: Marin Roberts, M.D.    Scheduled Meds: . calcium  carbonate  3 tablet Oral Daily  . estradiol  0.5 mg Oral Daily  . feeding supplement  237 mL Oral BID BM  . heparin  5,000 Units Subcutaneous Q8H  . olopatadine  1 drop Both Eyes BID  . risperiDONE  1 mg Oral QHS  . sodium chloride  3 mL Intravenous Q12H  . tiZANidine  4 mg Oral TID  . topiramate  100 mg Oral BID   Continuous Infusions: . sodium chloride      Principal Problem:   Drug overdose, multiple drugs Active Problems:   GERD (gastroesophageal reflux disease)   Lethargy   Hypokalemia   Protein-calorie malnutrition, severe    Time spent: > 35 minutes    Penny [name]  [facility] Pager [phone] If 7PM-7AM, please contact night-coverage at [email or web address], password [facility] 11/03/2012, 2:09 PM  LOS: 2 days

## 2012-11-04 LAB — VITAMIN D 25 HYDROXY (VIT D DEFICIENCY, FRACTURES): Vit D, 25-Hydroxy: 31 ng/mL (ref 30–89)

## 2012-11-04 MED ORDER — ENSURE COMPLETE PO LIQD: 237.0000 mL | Freq: Two times a day (BID) | ORAL | Status: DC

## 2012-11-04 NOTE — Discharge Summary (Signed)
Physician Discharge Summary  [name] NFA:[id number] DOB: 04-03-1966 DOA: 11/01/2012  PCP: Dartha Lodge, FNP  Admit date: 11/01/2012 Discharge date: 11/04/2012  Time spent: > 35 minutes  Recommendations for Outpatient Follow-up:  1. Please be sure to follow up with your primary care physician in 1-2 weeks or sooner for further evaluation and recommendations regarding you malnutrition. 2. Also please be sure to follow up with your psychiatrist and therapist regarding you psychiatric condition.  Discharge Diagnoses:  Principal Problem:   Drug overdose, multiple drugs Active Problems:   GERD (gastroesophageal reflux disease)   Lethargy   Hypokalemia   Protein-calorie malnutrition, severe   Severe malnutrition   Discharge Condition: stable  Diet recommendation: regular diet  Filed Weights   11/01/12 2226  Weight: 89.041 kg (196 lb 4.8 oz)    History of present illness:  47 y/o with history of Graves disease, fibromyalgia, GERD, Asthma, depression,  that presented to the ED after ingesting multiple medications.  Hospital Course:  1. Drug overdose multiple drugs - At this point patient is alert and oriented  - AST/ALT within normal limits  - Creatinine within normal limits   2. SI  - Psychiatry and CSW have evaluated patient and signed off. Patient is to continue outpatient psych services for her psychiatric conditions.  - Per psychiatrist recommendations: 1. Patient does not meet criteria for acute psychiatric hospitalization and has no safety concerns at this time  2. Patient will be referred to Out patient psychiatric care at [facility]  3. Recommended no medication changes at this time  4. Avoid excessive medication and follow the prescription instructions and contact PCP if needs assistance  5. Appreciate psych consultation services and will sign off at this time.    3. GERD  - stable on tums   4. Hypokalemia  - resolved with improved oral  intake and oral replacement  5. Malnutrition  - Per nutritionist evaluation they report:  Pt meets criteria for severe MALNUTRITION in the context of chronic illness as evidenced by <75% estimated energy intake with 16.9% weight loss in the past 5 months per pt report.  Agree with the above statement  - Continue Ensure complete BID  - most likely contributing to lower blood pressures   Procedures:  none  Consultations:  Psychiatry  Discharge Exam: Filed Vitals:   11/03/12 1430 11/03/12 1943 11/03/12 2110 11/04/12 0508  BP:  98/54 99/58 109/65  Pulse:  75 76 64  Temp: 97.8 F (36.6 C)  97.8 F (36.6 C) 97.3 F (36.3 C)  TempSrc:   Oral Oral  Resp:   16 16  Height:      Weight:      SpO2:   100% 100%    General: Pt in NAD, Alert and Awake Cardiovascular: RRR, no mrg Respiratory: CTA BL, no wheezes  Discharge Instructions  Discharge Orders   Future Orders Complete By Expires     Call MD for:  difficulty breathing, headache or visual disturbances  As directed     Call MD for:  extreme fatigue  As directed     Call MD for:  temperature >100.4  As directed     Diet - low sodium heart healthy  As directed     Discharge instructions  As directed     Comments:      You are to set up appointments with your psychiatrist/therapist as outpatient for your known psychiatric conditions.   Also we will prescribe ensure, a feeding  supplement, for your severe malnourishment.  You are to take as indicated and follow up with your primary care physician for further instructions.    Increase activity slowly  As directed         Medication List    STOP taking these medications       diazepam 5 MG tablet  Commonly known as:  VALIUM     DiphenhydrAMINE HCl (Sleep) 50 MG Caps     gabapentin 600 MG tablet  Commonly known as:  NEURONTIN     ibuprofen 200 MG tablet  Commonly known as:  ADVIL,MOTRIN     mometasone 50 MCG/ACT nasal spray  Commonly known as:  NASONEX      oxyCODONE-acetaminophen 5-325 MG per tablet  Commonly known as:  PERCOCET/ROXICET     predniSONE 50 MG tablet  Commonly known as:  DELTASONE     tiZANidine 4 MG capsule  Commonly known as:  ZANAFLEX     traZODone 100 MG tablet  Commonly known as:  DESYREL      TAKE these medications       albuterol 108 (90 BASE) MCG/ACT inhaler  Commonly known as:  PROVENTIL HFA;VENTOLIN HFA  Inhale 2 puffs into the lungs every 6 (six) hours as needed for wheezing.     ALPRAZolam 0.5 MG tablet  Commonly known as:  XANAX  Take 0.5 mg by mouth 2 (two) times daily as needed for sleep.     azelastine 137 MCG/SPRAY nasal spray  Commonly known as:  ASTELIN  Place 1 spray into the nose 2 (two) times daily. Use in each nostril as directed     BIOTIN 5000 PO  Take 1 tablet by mouth daily.     calcium carbonate 500 MG chewable tablet  Commonly known as:  TUMS - dosed in mg elemental calcium  Chew 3 tablets by mouth daily.     ELESTAT 0.05 % ophthalmic solution  Generic drug:  Epinastine HCl  Place 1 drop into both eyes 2 (two) times daily.     EPIPEN 0.3 mg/0.3 mL Devi  Generic drug:  EPINEPHrine  Inject 0.3 mg into the muscle as needed. For allergic reaction to stinging insects, mushrooms, and shellfish     estradiol 0.5 MG tablet  Commonly known as:  ESTRACE  Take 0.5 mg by mouth daily.     feeding supplement Liqd  Take 237 mLs by mouth 2 (two) times daily between meals.     meloxicam 7.5 MG tablet  Commonly known as:  MOBIC  Take 7.5 mg by mouth 2 (two) times daily.     mometasone-formoterol 100-5 MCG/ACT Aero  Commonly known as:  DULERA  Inhale 2 puffs into the lungs 2 (two) times daily.     risperiDONE 1 MG tablet  Commonly known as:  RISPERDAL  Take 1 mg by mouth at bedtime.     topiramate 25 MG tablet  Commonly known as:  TOPAMAX  Take 100 mg by mouth 2 (two) times daily.     Vitamin D 2000 UNITS Caps  Take by mouth.       Allergies  Allergen Reactions  . Mushroom  Ext Cmplx(Shiitake-Reishi-Mait) Anaphylaxis  . Nutritional Supplements Anaphylaxis    And other stinging insects  . Shellfish Allergy Anaphylaxis  . Hydrocodone-Acetaminophen Itching      The results of significant diagnostics from this hospitalization (including imaging, microbiology, ancillary and laboratory) are listed below for reference.    Significant Diagnostic Studies: Ct Head Wo Contrast  11/01/2012   *RADIOLOGY REPORT*  Clinical Data: Lethargy.  Ingestion of sleep and fills.  CT HEAD WITHOUT CONTRAST  Technique:  Contiguous axial images were obtained from the base of the skull through the vertex without contrast.  Comparison: CT head without contrast 05/05/2011  Findings: No acute cortical infarct, hemorrhage, mass lesion is present.  The ventricles are of normal size.  No significant extra- axial fluid collection is present.  The paranasal sinuses and mastoid air cells are clear.  The osseous skull is intact.  IMPRESSION: Negative CT of the head.   Original Report Authenticated By: Marin Roberts, M.D.    Microbiology: No results found for this or any previous visit (from the past 240 hour(s)).   Labs: Basic Metabolic Panel:  Recent Labs Lab 11/01/12 1745 11/02/12 0525 11/03/12 0530  NA 140 143 142  K 3.2* 3.2* 3.5  CL 106 110 113*  CO2 23 24 22   GLUCOSE 189* 90 92  BUN 12 13 14   CREATININE 0.83 0.79 0.87  CALCIUM 9.1 8.5 8.3*   Liver Function Tests:  Recent Labs Lab 11/01/12 1745 11/02/12 0525  AST 10 7  ALT 6 6  ALKPHOS 63 55  BILITOT 0.2* 0.1*  PROT 6.9 5.7*  ALBUMIN 3.3* 2.9*   No results found for this basename: LIPASE, AMYLASE,  in the last 168 hours No results found for this basename: AMMONIA,  in the last 168 hours CBC:  Recent Labs Lab 11/01/12 1745 11/02/12 0525  WBC 7.6 5.7  NEUTROABS 5.2  --   HGB 11.5* 10.0*  HCT 35.3* 31.1*  MCV 83.6 84.1  PLT 212 186   Cardiac Enzymes: No results found for this basename: CKTOTAL, CKMB,  CKMBINDEX, TROPONINI,  in the last 168 hours BNP: BNP (last 3 results) No results found for this basename: PROBNP,  in the last 8760 hours CBG: No results found for this basename: GLUCAP,  in the last 168 hours     Signed:  Penny [name]  [facility] 11/04/2012, 10:16 AM

## 2012-12-12 ENCOUNTER — Other Ambulatory Visit: Payer: Self-pay | Admitting: Women's Health

## 2012-12-12 DIAGNOSIS — Z1231 Encounter for screening mammogram for malignant neoplasm of breast: Secondary | ICD-10-CM

## 2012-12-23 ENCOUNTER — Emergency Department (HOSPITAL_COMMUNITY)
Admission: EM | Admit: 2012-12-23 | Discharge: 2012-12-23 | Disposition: A | Discharge status: Home / Self Care | Payer: Self-pay | Attending: Emergency Medicine | Admitting: Emergency Medicine

## 2012-12-23 DIAGNOSIS — F3289 Other specified depressive episodes: Secondary | ICD-10-CM | POA: Insufficient documentation

## 2012-12-23 DIAGNOSIS — IMO0002 Reserved for concepts with insufficient information to code with codable children: Secondary | ICD-10-CM | POA: Insufficient documentation

## 2012-12-23 DIAGNOSIS — R198 Other specified symptoms and signs involving the digestive system and abdomen: Secondary | ICD-10-CM | POA: Insufficient documentation

## 2012-12-23 DIAGNOSIS — M545 Low back pain, unspecified: Secondary | ICD-10-CM | POA: Insufficient documentation

## 2012-12-23 DIAGNOSIS — F329 Major depressive disorder, single episode, unspecified: Secondary | ICD-10-CM | POA: Insufficient documentation

## 2012-12-23 DIAGNOSIS — M6281 Muscle weakness (generalized): Secondary | ICD-10-CM | POA: Insufficient documentation

## 2012-12-23 DIAGNOSIS — Z862 Personal history of diseases of the blood and blood-forming organs and certain disorders involving the immune mechanism: Secondary | ICD-10-CM | POA: Insufficient documentation

## 2012-12-23 DIAGNOSIS — Z79899 Other long term (current) drug therapy: Secondary | ICD-10-CM | POA: Insufficient documentation

## 2012-12-23 DIAGNOSIS — Z8739 Personal history of other diseases of the musculoskeletal system and connective tissue: Secondary | ICD-10-CM | POA: Insufficient documentation

## 2012-12-23 DIAGNOSIS — Z9109 Other allergy status, other than to drugs and biological substances: Secondary | ICD-10-CM | POA: Insufficient documentation

## 2012-12-23 DIAGNOSIS — R42 Dizziness and giddiness: Secondary | ICD-10-CM | POA: Insufficient documentation

## 2012-12-23 DIAGNOSIS — J45909 Unspecified asthma, uncomplicated: Secondary | ICD-10-CM | POA: Insufficient documentation

## 2012-12-23 DIAGNOSIS — Z8639 Personal history of other endocrine, nutritional and metabolic disease: Secondary | ICD-10-CM | POA: Insufficient documentation

## 2012-12-23 DIAGNOSIS — R55 Syncope and collapse: Secondary | ICD-10-CM | POA: Insufficient documentation

## 2012-12-23 DIAGNOSIS — Z8719 Personal history of other diseases of the digestive system: Secondary | ICD-10-CM | POA: Insufficient documentation

## 2012-12-23 DIAGNOSIS — Z90710 Acquired absence of both cervix and uterus: Secondary | ICD-10-CM | POA: Insufficient documentation

## 2012-12-23 DIAGNOSIS — Z791 Long term (current) use of non-steroidal anti-inflammatories (NSAID): Secondary | ICD-10-CM | POA: Insufficient documentation

## 2012-12-23 LAB — CBC WITH DIFFERENTIAL/PLATELET
Basophils Absolute: 0 10*3/uL (ref 0.0–0.1)
Basophils Relative: 0 % (ref 0–1)
Eosinophils Absolute: 0.1 10*3/uL (ref 0.0–0.7)
Eosinophils Relative: 1 % (ref 0–5)
HCT: 35.5 % — ABNORMAL LOW (ref 36.0–46.0)
Hemoglobin: 11.8 g/dL — ABNORMAL LOW (ref 12.0–15.0)
Lymphocytes Relative: 23 % (ref 12–46)
Lymphs Abs: 1.4 10*3/uL (ref 0.7–4.0)
MCH: 28.2 pg (ref 26.0–34.0)
MCHC: 33.2 g/dL (ref 30.0–36.0)
MCV: 84.7 fL (ref 78.0–100.0)
Monocytes Absolute: 0.8 10*3/uL (ref 0.1–1.0)
Monocytes Relative: 13 % — ABNORMAL HIGH (ref 3–12)
Neutro Abs: 3.9 10*3/uL (ref 1.7–7.7)
Neutrophils Relative %: 63 % (ref 43–77)
Platelets: 245 10*3/uL (ref 150–400)
RBC: 4.19 MIL/uL (ref 3.87–5.11)
RDW: 14.2 % (ref 11.5–15.5)
WBC: 6.2 10*3/uL (ref 4.0–10.5)

## 2012-12-23 LAB — BASIC METABOLIC PANEL
BUN: 12 mg/dL (ref 6–23)
CO2: 25 mEq/L (ref 19–32)
Calcium: 8.8 mg/dL (ref 8.4–10.5)
Chloride: 105 mEq/L (ref 96–112)
Creatinine, Ser: 0.74 mg/dL (ref 0.50–1.10)
GFR calc Af Amer: >90 mL/min (ref >90)
GFR calc non Af Amer: >90 mL/min (ref >90)
Glucose, Bld: 87 mg/dL (ref 70–99)
Potassium: 3.8 mEq/L (ref 3.5–5.1)
Sodium: 140 mEq/L (ref 135–145)

## 2012-12-23 MED ORDER — ETODOLAC 500 MG PO TABS: 500.0000 mg | ORAL_TABLET | Freq: Two times a day (BID) | ORAL | Status: DC

## 2012-12-23 MED ORDER — SODIUM CHLORIDE 0.9 % IV SOLN: 1000.0000 mL | Freq: Once | INTRAVENOUS | Status: DC

## 2012-12-23 MED ORDER — SODIUM CHLORIDE 0.9 % IV SOLN: 1000.0000 mL | INTRAVENOUS | Status: DC

## 2012-12-23 MED ORDER — SODIUM CHLORIDE 0.9 % IV SOLN
1000.0000 mL | Freq: Once | INTRAVENOUS | Status: AC
  Administered 2012-12-23: 1000 mL via INTRAVENOUS

## 2012-12-23 NOTE — ED Notes (Signed)
Per EMS:  Pt was at [facility] today and had not consumed any food.  Pt stated that she became weak and dizzy;  Pt did  not fall as staff helped pt into a chair; no injuries sustained, no SOB, just feeling weak. CBG: 108  Hx: MS, Fibromyalgia.  Allergic to Vicodin

## 2012-12-23 NOTE — ED Provider Notes (Signed)
CSN: [id number]     Arrival date & time 12/23/12  1749 History     First MD Initiated Contact with Patient 12/23/12 1756     Chief Complaint  Patient presents with  . Near Syncope   HPI Patient presents to emergency room with complaints of syncope. The patient has not eating anything today.  while she was shopping at [employer] she began feeling lightheaded and dizzy. Patient was helped into a chair by staff. She just felt generalized weakness. Patient does have history of syncope. She denies any abdominal pain. She denies any vomiting or nausea. She does mention having a few episodes of loose stools. She's also had some mild lower back pain over the last 2 days. She denies any dysuria or urgency Past Medical History  Diagnosis Date  . Asthma   . GERD (gastroesophageal reflux disease)   . Grave's disease   . Fibromyalgia   . Environmental allergies   . Depression    Past Surgical History  Procedure Laterality Date  . Abdominal hysterectomy      partial  . Gastric bypass  2008   Family History  Problem Relation Age of Onset  . Hypertension Mother   . Cirrhosis Mother   . Hypertension Sister   . Diabetes Other   . Hypertension Other   . Diabetes Maternal Uncle   . Hypertension Father    History  Substance Use Topics  . Smoking status: Never Smoker   . Smokeless tobacco: Not on file  . Alcohol Use: No   OB History   Grav Para Term Preterm Abortions TAB SAB Ect Mult Living   2 2        2      Review of Systems  All other systems reviewed and are negative.    Allergies  Mushroom ext cmplx(shiitake-reishi-mait); Nutritional supplements; Shellfish allergy; and Hydrocodone-acetaminophen  Home Medications   Current Outpatient Rx  Name  Route  Sig  Dispense  Refill  . albuterol (PROVENTIL HFA;VENTOLIN HFA) 108 (90 BASE) MCG/ACT inhaler   Inhalation   Inhale 2 puffs into the lungs every 6 (six) hours as needed for wheezing.         Marland Kitchen ALPRAZolam (XANAX) 0.5 MG tablet  Oral   Take 0.5 mg by mouth 2 (two) times daily as needed for sleep.         Marland Kitchen azelastine (ASTELIN) 137 MCG/SPRAY nasal spray   Nasal   Place 1 spray into the nose 2 (two) times daily. Use in each nostril as directed         . BIOTIN 5000 PO   Oral   Take 1 tablet by mouth daily.         . calcium carbonate (TUMS - DOSED IN MG ELEMENTAL CALCIUM) 500 MG chewable tablet   Oral   Chew 3 tablets by mouth daily as needed for heartburn.          . cholecalciferol (VITAMIN D) 1000 UNITS tablet   Oral   Take 2,000 Units by mouth daily.         Marland Kitchen Epinastine HCl (ELESTAT) 0.05 % ophthalmic solution   Both Eyes   Place 1 drop into both eyes 2 (two) times daily.         Marland Kitchen EPINEPHrine (EPIPEN) 0.3 mg/0.3 mL DEVI   Intramuscular   Inject 0.3 mg into the muscle as needed. For allergic reaction to stinging insects, mushrooms, and shellfish          .  estradiol (ESTRACE) 0.5 MG tablet   Oral   Take 0.5 mg by mouth daily.         . meloxicam (MOBIC) 7.5 MG tablet   Oral   Take 7.5 mg by mouth 2 (two) times daily.          . mometasone-formoterol (DULERA) 100-5 MCG/ACT AERO   Inhalation   Inhale 2 puffs into the lungs 2 (two) times daily as needed for wheezing or shortness of breath.          . oxyCODONE-acetaminophen (PERCOCET/ROXICET) 5-325 MG per tablet   Oral   Take 1 tablet by mouth every 4 (four) hours as needed for pain.         Marland Kitchen risperiDONE (RISPERDAL) 1 MG tablet   Oral   Take 1 mg by mouth at bedtime.         . topiramate (TOPAMAX) 25 MG tablet   Oral   Take 100 mg by mouth 2 (two) times daily.         Marland Kitchen etodolac (LODINE) 500 MG tablet   Oral   Take 1 tablet (500 mg total) by mouth 2 (two) times daily.   20 tablet   0    BP 100/60  Pulse 69  Temp(Src) 98.2 F (36.8 C) (Oral)  Resp 18  SpO2 99% Physical Exam  Nursing note and vitals reviewed. Constitutional: She appears well-developed and well-nourished. No distress.  HENT:  Head:  Normocephalic and atraumatic.  Right Ear: External ear normal.  Left Ear: External ear normal.  Eyes: Conjunctivae are normal. Right eye exhibits no discharge. Left eye exhibits no discharge. No scleral icterus.  Neck: Neck supple. No tracheal deviation present.  Cardiovascular: Normal rate, regular rhythm and intact distal pulses.   Pulmonary/Chest: Effort normal and breath sounds normal. No stridor. No respiratory distress. She has no wheezes. She has no rales.  Abdominal: Soft. Bowel sounds are normal. She exhibits no distension. There is no tenderness. There is no rebound and no guarding.  Musculoskeletal: She exhibits no edema and no tenderness.  Neurological: She is alert. She has normal strength. No sensory deficit. Cranial nerve deficit:  no gross defecits noted. She exhibits normal muscle tone. She displays no seizure activity. Coordination normal.  Skin: Skin is warm and dry. No rash noted.  Psychiatric: She has a normal mood and affect.    ED Course   Rate: 67  Rhythm: normal sinus rhythm  QRS Axis: normal  Intervals: normal  ST/T Wave abnormalities: normal  Conduction Disutrbances:none  Narrative Interpretation:   Old EKG Reviewed: no sig changes  Procedures (including critical care time)  Labs Reviewed  CBC WITH DIFFERENTIAL - Abnormal; Notable for the following:    Hemoglobin 11.8 (*)    HCT 35.5 (*)    Monocytes Relative 13 (*)    All other components within normal limits  BASIC METABOLIC PANEL   No results found. 1. Near syncope     MDM  Patient's symptoms were most likely related to vasovagal syncope. Her laboratory tests are reassuring. Her EKG is unremarkable.  The patient has a history of fibromyalgia. She has complained of some mild back discomfort for the last few days. Patient requested a prescription for pain medications  Celene Kras, MD 12/23/12 2008

## 2012-12-25 ENCOUNTER — Ambulatory Visit (HOSPITAL_COMMUNITY)
Admission: RE | Admit: 2012-12-25 | Discharge: 2012-12-25 | Disposition: A | Discharge status: Home / Self Care | Payer: No Typology Code available for payment source | Source: Ambulatory Visit | Attending: Women's Health | Admitting: Women's Health

## 2012-12-25 DIAGNOSIS — Z1231 Encounter for screening mammogram for malignant neoplasm of breast: Secondary | ICD-10-CM | POA: Insufficient documentation

## 2013-02-01 ENCOUNTER — Emergency Department (HOSPITAL_COMMUNITY)
Admission: EM | Admit: 2013-02-01 | Discharge: 2013-02-02 | Disposition: A | Discharge status: Home / Self Care | Payer: No Typology Code available for payment source | Attending: Emergency Medicine | Admitting: Emergency Medicine

## 2013-02-01 ENCOUNTER — Other Ambulatory Visit: Payer: Self-pay

## 2013-02-01 ENCOUNTER — Encounter (HOSPITAL_COMMUNITY): Payer: Self-pay | Admitting: Emergency Medicine

## 2013-02-01 DIAGNOSIS — Z3202 Encounter for pregnancy test, result negative: Secondary | ICD-10-CM | POA: Insufficient documentation

## 2013-02-01 DIAGNOSIS — Z862 Personal history of diseases of the blood and blood-forming organs and certain disorders involving the immune mechanism: Secondary | ICD-10-CM | POA: Insufficient documentation

## 2013-02-01 DIAGNOSIS — R5381 Other malaise: Secondary | ICD-10-CM | POA: Insufficient documentation

## 2013-02-01 DIAGNOSIS — F3289 Other specified depressive episodes: Secondary | ICD-10-CM | POA: Insufficient documentation

## 2013-02-01 DIAGNOSIS — Z79899 Other long term (current) drug therapy: Secondary | ICD-10-CM | POA: Insufficient documentation

## 2013-02-01 DIAGNOSIS — Z9884 Bariatric surgery status: Secondary | ICD-10-CM | POA: Insufficient documentation

## 2013-02-01 DIAGNOSIS — IMO0001 Reserved for inherently not codable concepts without codable children: Secondary | ICD-10-CM | POA: Insufficient documentation

## 2013-02-01 DIAGNOSIS — Z791 Long term (current) use of non-steroidal anti-inflammatories (NSAID): Secondary | ICD-10-CM | POA: Insufficient documentation

## 2013-02-01 DIAGNOSIS — L659 Nonscarring hair loss, unspecified: Secondary | ICD-10-CM | POA: Insufficient documentation

## 2013-02-01 DIAGNOSIS — J45909 Unspecified asthma, uncomplicated: Secondary | ICD-10-CM | POA: Insufficient documentation

## 2013-02-01 DIAGNOSIS — G4700 Insomnia, unspecified: Secondary | ICD-10-CM | POA: Insufficient documentation

## 2013-02-01 DIAGNOSIS — Z8639 Personal history of other endocrine, nutritional and metabolic disease: Secondary | ICD-10-CM | POA: Insufficient documentation

## 2013-02-01 DIAGNOSIS — G8929 Other chronic pain: Secondary | ICD-10-CM | POA: Insufficient documentation

## 2013-02-01 DIAGNOSIS — F329 Major depressive disorder, single episode, unspecified: Secondary | ICD-10-CM | POA: Insufficient documentation

## 2013-02-01 DIAGNOSIS — K219 Gastro-esophageal reflux disease without esophagitis: Secondary | ICD-10-CM | POA: Insufficient documentation

## 2013-02-01 DIAGNOSIS — M549 Dorsalgia, unspecified: Secondary | ICD-10-CM | POA: Insufficient documentation

## 2013-02-01 DIAGNOSIS — R55 Syncope and collapse: Secondary | ICD-10-CM | POA: Insufficient documentation

## 2013-02-01 DIAGNOSIS — I959 Hypotension, unspecified: Secondary | ICD-10-CM | POA: Insufficient documentation

## 2013-02-01 DIAGNOSIS — R259 Unspecified abnormal involuntary movements: Secondary | ICD-10-CM | POA: Insufficient documentation

## 2013-02-01 HISTORY — DX: Sciatica, unspecified side: M54.30

## 2013-02-01 LAB — CBC
HCT: 36.2 % (ref 36.0–46.0)
Hemoglobin: 11.9 g/dL — ABNORMAL LOW (ref 12.0–15.0)
MCH: 27.8 pg (ref 26.0–34.0)
MCHC: 32.9 g/dL (ref 30.0–36.0)
MCV: 84.6 fL (ref 78.0–100.0)
Platelets: 255 10*3/uL (ref 150–400)
RBC: 4.28 MIL/uL (ref 3.87–5.11)
RDW: 13 % (ref 11.5–15.5)
WBC: 7 10*3/uL (ref 4.0–10.5)

## 2013-02-01 LAB — BASIC METABOLIC PANEL
BUN: 18 mg/dL (ref 6–23)
CO2: 25 mEq/L (ref 19–32)
Calcium: 9 mg/dL (ref 8.4–10.5)
Chloride: 105 mEq/L (ref 96–112)
Creatinine, Ser: 0.88 mg/dL (ref 0.50–1.10)
GFR calc Af Amer: 88 mL/min — ABNORMAL LOW (ref >90)
GFR calc non Af Amer: 76 mL/min — ABNORMAL LOW (ref >90)
Glucose, Bld: 92 mg/dL (ref 70–99)
Potassium: 3.9 mEq/L (ref 3.5–5.1)
Sodium: 138 mEq/L (ref 135–145)

## 2013-02-01 LAB — URINALYSIS, ROUTINE W REFLEX MICROSCOPIC
Glucose, UA: NEGATIVE mg/dL
Hgb urine dipstick: NEGATIVE
Ketones, ur: NEGATIVE mg/dL
Leukocytes, UA: NEGATIVE
Nitrite: NEGATIVE
Protein, ur: NEGATIVE mg/dL
Specific Gravity, Urine: 1.035 — ABNORMAL HIGH (ref 1.005–1.030)
Urobilinogen, UA: 0.2 mg/dL (ref 0.0–1.0)
pH: 5.5 (ref 5.0–8.0)

## 2013-02-01 LAB — PREGNANCY, URINE: Preg Test, Ur: NEGATIVE

## 2013-02-01 MED ORDER — SODIUM CHLORIDE 0.9 % IV BOLUS (SEPSIS)
1000.0000 mL | Freq: Once | INTRAVENOUS | Status: AC
  Administered 2013-02-01: 1000 mL via INTRAVENOUS

## 2013-02-01 MED ORDER — SODIUM CHLORIDE 0.9 % IV BOLUS (SEPSIS)
1000.0000 mL | Freq: Once | INTRAVENOUS | Status: AC
  Administered 2013-02-02: 1000 mL via INTRAVENOUS

## 2013-02-01 NOTE — ED Provider Notes (Signed)
CSN: [id number]     Arrival date & time 02/01/13  1949 History   First MD Initiated Contact with Patient 02/01/13 2114     Chief Complaint  Patient presents with  . Hypotension   (Consider location/radiation/quality/duration/timing/severity/associated sxs/prior Treatment) The history is provided by the patient and medical records. No language interpreter was used.    [name] is a 47 y.o. female  with a hx of Asthma, GERD, Grave's disease, fibromyalgia, allergies, depression presents to the Emergency Department complaining of gradual, persistent, progressively worsening fatigue beginning. Pt reports that when she was here last, they told her she was malnourished and she has been drinking a complete meal replacement shake and attempting to eat more protein.  Reports that for the last week or so she's had decreased appetite and decreased fluid intake. Pt reports she was at her PCP and her BP was 98/60; he was concerned and sent her here to the ER.  Pt reports intermittent syncopal episodes (1 this month and 3 last month, 2 in July.  Pt reports the syncopal episodes began in June 2014 for the first time.  Pt reports she lost 70 lbs in 4 mos without trying and then the syncopal episodes began.  Associated symptoms include alopecia, fatigue, tremors, insomnia.  Nothing seems to make the symptoms better or worse. Patient denies fever, chills, headache, neck pain, chest pain, shortness of breath, abdominal pain, nausea, vomiting, diarrhea, dizziness, dysuria, hematuria.  Pt's thyroid was check 7-8 mos ago by her PCP.     Past Medical History  Diagnosis Date  . Asthma   . GERD (gastroesophageal reflux disease)   . Grave's disease   . Fibromyalgia   . Environmental allergies   . Depression   . Sciatica    Past Surgical History  Procedure Laterality Date  . Abdominal hysterectomy      partial  . Gastric bypass  2008   Family History  Problem Relation Age of Onset  . Hypertension Mother    . Cirrhosis Mother   . Hypertension Sister   . Diabetes Other   . Hypertension Other   . Diabetes Maternal Uncle   . Hypertension Father    History  Substance Use Topics  . Smoking status: Never Smoker   . Smokeless tobacco: Not on file  . Alcohol Use: No   OB History   Grav Para Term Preterm Abortions TAB SAB Ect Mult Living   2 2        2      Review of Systems  Constitutional: Positive for fatigue. Negative for fever, diaphoresis, appetite change and unexpected weight change.  HENT: Negative for mouth sores and neck stiffness.   Eyes: Negative for visual disturbance.  Respiratory: Negative for cough, chest tightness, shortness of breath and wheezing.   Cardiovascular: Negative for chest pain.  Gastrointestinal: Negative for nausea, vomiting, abdominal pain, diarrhea and constipation.  Endocrine: Negative for polydipsia, polyphagia and polyuria.  Genitourinary: Negative for dysuria, urgency, frequency and hematuria.  Musculoskeletal: Positive for back pain (chronic) and arthralgias (chronic). Negative for myalgias, joint swelling and gait problem.  Skin: Negative for rash.  Allergic/Immunologic: Negative for immunocompromised state.  Neurological: Negative for syncope, light-headedness and headaches.  Hematological: Does not bruise/bleed easily.  Psychiatric/Behavioral: Negative for sleep disturbance. The patient is not nervous/anxious.     Allergies  Mushroom ext cmplx(shiitake-reishi-mait); Nutritional supplements; Shellfish allergy; and Hydrocodone-acetaminophen  Home Medications   Current Outpatient Rx  Name  Route  Sig  Dispense  Refill  . albuterol (PROVENTIL HFA;VENTOLIN HFA) 108 (90 BASE) MCG/ACT inhaler   Inhalation   Inhale 2 puffs into the lungs every 6 (six) hours as needed for wheezing.         Marland Kitchen ALPRAZolam (XANAX) 0.5 MG tablet   Oral   Take 0.5 mg by mouth 2 (two) times daily as needed for sleep.         Marland Kitchen azelastine (ASTELIN) 137 MCG/SPRAY  nasal spray   Nasal   Place 1 spray into the nose 2 (two) times daily. Use in each nostril as directed         . BIOTIN 5000 PO   Oral   Take 1 tablet by mouth daily.         . calcium carbonate (TUMS - DOSED IN MG ELEMENTAL CALCIUM) 500 MG chewable tablet   Oral   Chew 3 tablets by mouth daily as needed for heartburn.          . cholecalciferol (VITAMIN D) 1000 UNITS tablet   Oral   Take 2,000 Units by mouth daily.         Marland Kitchen Epinastine HCl (ELESTAT) 0.05 % ophthalmic solution   Both Eyes   Place 1 drop into both eyes 2 (two) times daily.         Marland Kitchen estradiol (ESTRACE) 0.5 MG tablet   Oral   Take 0.5 mg by mouth daily.         Marland Kitchen etodolac (LODINE) 500 MG tablet   Oral   Take 1 tablet (500 mg total) by mouth 2 (two) times daily.   20 tablet   0   . ibuprofen (ADVIL,MOTRIN) 200 MG tablet   Oral   Take 600 mg by mouth every 6 (six) hours as needed for pain (back pain).         . meloxicam (MOBIC) 7.5 MG tablet   Oral   Take 7.5 mg by mouth 2 (two) times daily.          . mometasone-formoterol (DULERA) 100-5 MCG/ACT AERO   Inhalation   Inhale 2 puffs into the lungs 2 (two) times daily as needed for wheezing or shortness of breath.          . Multiple Vitamins-Minerals (MULTIVITAMIN WITH MINERALS) tablet   Oral   Take 1 tablet by mouth daily.         Marland Kitchen oxyCODONE-acetaminophen (PERCOCET/ROXICET) 5-325 MG per tablet   Oral   Take 1 tablet by mouth every 4 (four) hours as needed for pain.         Marland Kitchen risperiDONE (RISPERDAL) 1 MG tablet   Oral   Take 1 mg by mouth at bedtime.         . topiramate (TOPAMAX) 25 MG tablet   Oral   Take 100 mg by mouth 2 (two) times daily.         Marland Kitchen EPINEPHrine (EPIPEN) 0.3 mg/0.3 mL DEVI   Intramuscular   Inject 0.3 mg into the muscle as needed. For allergic reaction to stinging insects, mushrooms, and shellfish           BP 113/66  Pulse 103  Temp(Src) 98.3 F (36.8 C) (Oral)  Resp 17  SpO2  100% Physical Exam  Nursing note and vitals reviewed. Constitutional: She is oriented to person, place, and time. She appears well-developed and well-nourished. No distress.  Awake, alert, nontoxic appearance  HENT:  Head: Normocephalic and atraumatic.  Right Ear: Tympanic membrane, external ear and  ear canal normal.  Left Ear: Tympanic membrane, external ear and ear canal normal.  Nose: Nose normal. No mucosal edema.  Mouth/Throat: Uvula is midline, oropharynx is clear and moist and mucous membranes are normal. Mucous membranes are not dry and not cyanotic. No oropharyngeal exudate, posterior oropharyngeal edema, posterior oropharyngeal erythema or tonsillar abscesses.  Eyes: Conjunctivae and EOM are normal. Pupils are equal, round, and reactive to light. No scleral icterus.  Neck: Normal range of motion. Neck supple.  Cardiovascular: Normal rate, regular rhythm, normal heart sounds and intact distal pulses.   No murmur heard. Pulmonary/Chest: Effort normal and breath sounds normal. No respiratory distress. She has no wheezes. She has no rales.  Abdominal: Soft. Bowel sounds are normal. She exhibits no distension and no mass. There is no tenderness. There is no rebound and no guarding.  Musculoskeletal: Normal range of motion. She exhibits no edema.  Lymphadenopathy:    She has no cervical adenopathy.  Neurological: She is alert and oriented to person, place, and time. No cranial nerve deficit. She exhibits normal muscle tone. Coordination normal.  Speech is clear and goal oriented, follows commands Major Cranial nerves without deficit, no facial droop Normal strength in upper and lower extremities bilaterally including dorsiflexion and plantar flexion, strong and equal grip strength Sensation normal to light and sharp touch Moves extremities without ataxia, coordination intact   Skin: Skin is warm and dry. No rash noted. She is not diaphoretic. No erythema.  Psychiatric: She has a  normal mood and affect. Her behavior is normal.    ED Course  Procedures (including critical care time) Labs Review Labs Reviewed  CBC - Abnormal; Notable for the following:    Hemoglobin 11.9 (*)    All other components within normal limits  BASIC METABOLIC PANEL - Abnormal; Notable for the following:    GFR calc non Af Amer 76 (*)    GFR calc Af Amer 88 (*)    All other components within normal limits  URINALYSIS, ROUTINE W REFLEX MICROSCOPIC - Abnormal; Notable for the following:    Color, Urine AMBER (*)    Specific Gravity, Urine 1.035 (*)    Bilirubin Urine LARGE (*)    All other components within normal limits  PREGNANCY, URINE   Imaging Review No results found.  MDM   1. Hypotension   2. Chronic pain      [name] presents from her primary care with hypertension. Patient found to be 90/50 in the emergency department. Patient without further complaint. We'll give several fluid boluses and reevaluate.  12:54 AM Patient has only been given one fluid bolus though a second has been ordered for greater than one hour. Last blood pressure 100/80.  Requested that the RN begin her second fluid bolus and re-evaluate.  2:06 AM Patient given 2 L of fluid. Orthostatic vital signs without evidence of orthostasis.  Patient is non-tachycardic. She's no longer hypertensive. Patient hypertension likely secondary to dehydration and use of the beta blocker. Recommend strict followup with her primary care physician for discussion of beta blocker use and whether or not she should continue. Also discussed reasons to continue with extra fluid by mouth for the next several days.  It has been determined that no acute conditions requiring further emergency intervention are present at this time. The patient/guardian have been advised of the diagnosis and plan. We have discussed signs and symptoms that warrant return to the ED, such as changes or worsening in symptoms.   Vital  signs are  stable at discharge.   BP 113/66  Pulse 103  Temp(Src) 98.3 F (36.8 C) (Oral)  Resp 17  SpO2 100%  Patient/guardian has voiced understanding and agreed to follow-up with the PCP or specialist.      Dierdre Forth, PA-C 02/02/13 1610

## 2013-02-01 NOTE — ED Notes (Signed)
Pt was sent her by her PCP for hypotension today, episodes of syncope, last 8/28.  Pt reports decreased appetite and weakness.

## 2013-02-06 NOTE — ED Provider Notes (Signed)
Medical screening examination/treatment/procedure(s) were performed by non-physician practitioner and as supervising physician I was immediately available for consultation/collaboration.  Supriya Beaston R. Bailey Kolbe, MD 02/06/13 0710 

## 2013-03-27 ENCOUNTER — Telehealth: Payer: Self-pay | Admitting: *Deleted

## 2013-03-27 NOTE — Telephone Encounter (Signed)
Please call and inform it is on Monday, November 10, (next week) 6-8 at [facility], she could call Wynona Canes to make an appointment and that number is [phone]. I will be there and she can ask for me.

## 2013-03-27 NOTE — Telephone Encounter (Signed)
Pt has no insurance and would like to know where/ when the free clinic will be this year? Please advise

## 2013-03-27 NOTE — Telephone Encounter (Signed)
Left the below on pt voicemail. 

## 2013-03-29 ENCOUNTER — Other Ambulatory Visit: Payer: Self-pay

## 2013-04-18 ENCOUNTER — Emergency Department (HOSPITAL_COMMUNITY)
Admission: EM | Admit: 2013-04-18 | Discharge: 2013-04-18 | Disposition: A | Discharge status: Home / Self Care | Payer: No Typology Code available for payment source | Attending: Emergency Medicine | Admitting: Emergency Medicine

## 2013-04-18 ENCOUNTER — Encounter (HOSPITAL_COMMUNITY): Payer: Self-pay | Admitting: Emergency Medicine

## 2013-04-18 DIAGNOSIS — Z7982 Long term (current) use of aspirin: Secondary | ICD-10-CM | POA: Insufficient documentation

## 2013-04-18 DIAGNOSIS — M545 Low back pain, unspecified: Secondary | ICD-10-CM | POA: Insufficient documentation

## 2013-04-18 DIAGNOSIS — Z862 Personal history of diseases of the blood and blood-forming organs and certain disorders involving the immune mechanism: Secondary | ICD-10-CM | POA: Insufficient documentation

## 2013-04-18 DIAGNOSIS — F3289 Other specified depressive episodes: Secondary | ICD-10-CM | POA: Insufficient documentation

## 2013-04-18 DIAGNOSIS — Z79899 Other long term (current) drug therapy: Secondary | ICD-10-CM | POA: Insufficient documentation

## 2013-04-18 DIAGNOSIS — A599 Trichomoniasis, unspecified: Secondary | ICD-10-CM | POA: Insufficient documentation

## 2013-04-18 DIAGNOSIS — Z791 Long term (current) use of non-steroidal anti-inflammatories (NSAID): Secondary | ICD-10-CM | POA: Insufficient documentation

## 2013-04-18 DIAGNOSIS — R55 Syncope and collapse: Secondary | ICD-10-CM | POA: Insufficient documentation

## 2013-04-18 DIAGNOSIS — F329 Major depressive disorder, single episode, unspecified: Secondary | ICD-10-CM | POA: Insufficient documentation

## 2013-04-18 DIAGNOSIS — M79609 Pain in unspecified limb: Secondary | ICD-10-CM | POA: Insufficient documentation

## 2013-04-18 DIAGNOSIS — Z8639 Personal history of other endocrine, nutritional and metabolic disease: Secondary | ICD-10-CM | POA: Insufficient documentation

## 2013-04-18 DIAGNOSIS — Z8719 Personal history of other diseases of the digestive system: Secondary | ICD-10-CM | POA: Insufficient documentation

## 2013-04-18 DIAGNOSIS — J45909 Unspecified asthma, uncomplicated: Secondary | ICD-10-CM | POA: Insufficient documentation

## 2013-04-18 DIAGNOSIS — IMO0001 Reserved for inherently not codable concepts without codable children: Secondary | ICD-10-CM | POA: Insufficient documentation

## 2013-04-18 DIAGNOSIS — G8929 Other chronic pain: Secondary | ICD-10-CM | POA: Insufficient documentation

## 2013-04-18 LAB — CBC
HCT: 36.5 % (ref 36.0–46.0)
Hemoglobin: 12.1 g/dL (ref 12.0–15.0)
MCH: 28.2 pg (ref 26.0–34.0)
MCHC: 33.2 g/dL (ref 30.0–36.0)
MCV: 85.1 fL (ref 78.0–100.0)
Platelets: 227 10*3/uL (ref 150–400)
RBC: 4.29 MIL/uL (ref 3.87–5.11)
RDW: 14.3 % (ref 11.5–15.5)
WBC: 4.8 10*3/uL (ref 4.0–10.5)

## 2013-04-18 LAB — URINE MICROSCOPIC-ADD ON

## 2013-04-18 LAB — BASIC METABOLIC PANEL
BUN: 14 mg/dL (ref 6–23)
CO2: 23 mEq/L (ref 19–32)
Calcium: 9.1 mg/dL (ref 8.4–10.5)
Chloride: 108 mEq/L (ref 96–112)
Creatinine, Ser: 0.8 mg/dL (ref 0.50–1.10)
GFR calc Af Amer: >90 mL/min (ref >90)
GFR calc non Af Amer: 85 mL/min — ABNORMAL LOW (ref >90)
Glucose, Bld: 100 mg/dL — ABNORMAL HIGH (ref 70–99)
Potassium: 4 mEq/L (ref 3.5–5.1)
Sodium: 139 mEq/L (ref 135–145)

## 2013-04-18 LAB — URINALYSIS, ROUTINE W REFLEX MICROSCOPIC
Glucose, UA: NEGATIVE mg/dL
Hgb urine dipstick: NEGATIVE
Ketones, ur: NEGATIVE mg/dL
Nitrite: NEGATIVE
Protein, ur: NEGATIVE mg/dL
Specific Gravity, Urine: 1.026 (ref 1.005–1.030)
Urobilinogen, UA: 1 mg/dL (ref 0.0–1.0)
pH: 7 (ref 5.0–8.0)

## 2013-04-18 MED ORDER — METRONIDAZOLE 500 MG PO TABS
2000.0000 mg | ORAL_TABLET | Freq: Once | ORAL | Status: AC
  Administered 2013-04-18: 2000 mg via ORAL
  Filled 2013-04-18: qty 4

## 2013-04-18 MED ORDER — OXYCODONE-ACETAMINOPHEN 5-325 MG PO TABS: 1.0000 | ORAL_TABLET | Freq: Four times a day (QID) | ORAL | Status: DC | PRN

## 2013-04-18 MED ORDER — OXYCODONE-ACETAMINOPHEN 5-325 MG PO TABS
1.0000 | ORAL_TABLET | Freq: Once | ORAL | Status: AC
  Administered 2013-04-18: 1 via ORAL
  Filled 2013-04-18: qty 1

## 2013-04-18 NOTE — ED Notes (Signed)
Pt reports dizziness when moved from different positions for orthostatic vs.

## 2013-04-18 NOTE — ED Provider Notes (Signed)
CSN: [id number]     Arrival date & time 04/18/13  1649 History   First MD Initiated Contact with Patient 04/18/13 1717     Chief Complaint  Patient presents with  . Loss of Consciousness   (Consider location/radiation/quality/duration/timing/severity/associated sxs/prior Treatment) HPI Pt with hx of fibromyalgia presenting with c/o bilateral leg pain and low back pain.  She states this is similar to her prior flares of fibromyalgia.  She states pain increased approx 6 days ago and for the past 4 days she has been mostly lying in bed due to pain.  She takes mobic and had run out of the muscle relaxant that she states hadn't been helping much anyway.  No fever/chills.   No vomiting or diarrhea.  approx 1 week ago she went to the gym with a friend and thinks this may be what increased her pain.  No injury or trauma.  Denies urinary symptoms.  States she has made an appointment with her doctor, but she couldn't be seen for several days.  She also states that while getting ready to come to the ED she was in the shower and felt warm and flushed, walked back to her bed, then fainted.  She states she has nearly fainted in the past when she has had pain flares and has been lying in bed.  No chest pain, no severe headache, no palpitations.  There are no other associated systemic symptoms, there are no other alleviating or modifying factors.   Past Medical History  Diagnosis Date  . Asthma   . GERD (gastroesophageal reflux disease)   . Grave's disease   . Fibromyalgia   . Environmental allergies   . Depression   . Sciatica    Past Surgical History  Procedure Laterality Date  . Abdominal hysterectomy      partial  . Gastric bypass  2008   Family History  Problem Relation Age of Onset  . Hypertension Mother   . Cirrhosis Mother   . Hypertension Sister   . Diabetes Other   . Hypertension Other   . Diabetes Maternal Uncle   . Hypertension Father    History  Substance Use Topics  . Smoking  status: Never Smoker   . Smokeless tobacco: Not on file  . Alcohol Use: No   OB History   Grav Para Term Preterm Abortions TAB SAB Ect Mult Living   2 2        2      Review of Systems ROS reviewed and all otherwise negative except for mentioned in HPI  Allergies  Mushroom ext cmplx(shiitake-reishi-mait); Nutritional supplements; Shellfish allergy; and Hydrocodone-acetaminophen  Home Medications   Current Outpatient Rx  Name  Route  Sig  Dispense  Refill  . albuterol (PROVENTIL HFA;VENTOLIN HFA) 108 (90 BASE) MCG/ACT inhaler   Inhalation   Inhale 2 puffs into the lungs every 6 (six) hours as needed for wheezing.         Marland Kitchen ALPRAZolam (XANAX) 0.5 MG tablet   Oral   Take 0.5 mg by mouth 2 (two) times daily as needed for sleep.         Marland Kitchen aspirin 81 MG chewable tablet   Oral   Chew 81 mg by mouth daily.         Marland Kitchen azelastine (ASTELIN) 137 MCG/SPRAY nasal spray   Nasal   Place 1 spray into the nose 2 (two) times daily. Use in each nostril as directed         .  BIOTIN 5000 PO   Oral   Take 1 tablet by mouth daily.         . calcium carbonate (TUMS - DOSED IN MG ELEMENTAL CALCIUM) 500 MG chewable tablet   Oral   Chew 3 tablets by mouth daily as needed for heartburn.          . cholecalciferol (VITAMIN D) 1000 UNITS tablet   Oral   Take 2,000 Units by mouth daily.         Marland Kitchen Epinastine HCl (ELESTAT) 0.05 % ophthalmic solution   Both Eyes   Place 1 drop into both eyes 2 (two) times daily.         Marland Kitchen EPINEPHrine (EPIPEN) 0.3 mg/0.3 mL DEVI   Intramuscular   Inject 0.3 mg into the muscle as needed. For allergic reaction to stinging insects, mushrooms, and shellfish          . estradiol (ESTRACE) 0.5 MG tablet   Oral   Take 0.5 mg by mouth daily.         Marland Kitchen etodolac (LODINE) 500 MG tablet   Oral   Take 1 tablet (500 mg total) by mouth 2 (two) times daily.   20 tablet   0   . ibuprofen (ADVIL,MOTRIN) 200 MG tablet   Oral   Take 600 mg by mouth every  6 (six) hours as needed for pain (back pain).         . meloxicam (MOBIC) 7.5 MG tablet   Oral   Take 7.5 mg by mouth 2 (two) times daily.          . mometasone-formoterol (DULERA) 100-5 MCG/ACT AERO   Inhalation   Inhale 2 puffs into the lungs 2 (two) times daily as needed for wheezing or shortness of breath.          . Multiple Vitamins-Minerals (MULTIVITAMIN WITH MINERALS) tablet   Oral   Take 1 tablet by mouth daily.         Marland Kitchen topiramate (TOPAMAX) 25 MG tablet   Oral   Take 100 mg by mouth 2 (two) times daily.         Marland Kitchen oxyCODONE-acetaminophen (PERCOCET/ROXICET) 5-325 MG per tablet   Oral   Take 1-2 tablets by mouth every 6 (six) hours as needed for severe pain.   15 tablet   0    BP 108/73  Pulse 78  Temp(Src) 98.3 F (36.8 C) (Oral)  Resp 18  SpO2 100% Vitals reviewed Physical Exam Physical Examination: General appearance - alert, well appearing, and in no distress Mental status - alert, oriented to person, place, and time Eyes - no scleral icterus, no conjunctival injection Mouth - mucous membranes moist, pharynx normal without lesions Chest - clear to auscultation, no wheezes, rales or rhonchi, symmetric air entry Heart - normal rate, regular rhythm, normal S1, S2, no murmurs, rubs, clicks or gallops Abdomen - soft, nontender, nondistended, no masses or organomegaly Musculoskeletal - no joint tenderness, deformity or swelling Extremities - peripheral pulses normal, no pedal edema, no clubbing or cyanosis Skin - normal coloration and turgor, no rashes  ED Course  Procedures (including critical care time) Labs Review Labs Reviewed  BASIC METABOLIC PANEL - Abnormal; Notable for the following:    Glucose, Bld 100 (*)    GFR calc non Af Amer 85 (*)    All other components within normal limits  URINALYSIS, ROUTINE W REFLEX MICROSCOPIC - Abnormal; Notable for the following:    Bilirubin Urine SMALL (*)  Leukocytes, UA SMALL (*)    All other  components within normal limits  URINE MICROSCOPIC-ADD ON - Abnormal; Notable for the following:    Bacteria, UA MANY (*)    All other components within normal limits  URINE CULTURE  GC/CHLAMYDIA PROBE AMP  CBC   Imaging Review No results found.  EKG Interpretation    Date/Time:  Wednesday April 18 2013 17:33:56 EST Ventricular Rate:  75 PR Interval:  182 QRS Duration: 84 QT Interval:  374 QTC Calculation: 418 R Axis:   15 Text Interpretation:  normal sinus rhythm EARLY R WAVE PROGRESSION nonspecific t wave abnomality No significant change since last tracing Confirmed by [facility]  MD, MARTHA [phone]) on 04/18/2013 8:01:59 PM            MDM   1. Chronic pain   2. Syncope   3. Trichomoniasis    Pt presenting with c/o bilateral leg pain and low back pain.  She feels this is a flare of her fibromyalgia.  Labs reassuring.  EKG reassuring and pt not anemic.  Pt has incidental finding of trichomonis in her UA- urine sent also for GC/Chlamydia- she has no symptoms concerning for PID or other STD at this time.  Pt treated with flagyl in the ED.  Treated with percocet in the ED and given rx for small number until follow up with her doctor.  Discharged with strict return precautions.  Pt agreeable with plan.    Ethelda Chick, MD 04/18/13 2111

## 2013-04-18 NOTE — ED Notes (Signed)
She states she has had increase in her usual fibromyalgia pain in her legs and back x 4 days; also this same interval she c/o frontal h/a with some sm. Yellow sinus drainage.  She is in no distress.  She also tells me that, while with her sister, as she was showering to get ready to go to hospital, she "passed-out".

## 2013-04-18 NOTE — ED Notes (Addendum)
Pt reports taking half of a oxycodone around 1400. Pt do not remember dosage. Reports pain 8/10 in lower back and legs with hx of sciatica and fibromyalgia.

## 2013-04-20 LAB — URINE CULTURE: Colony Count: >=100000

## 2013-04-22 ENCOUNTER — Telehealth (HOSPITAL_COMMUNITY): Payer: Self-pay | Admitting: Emergency Medicine

## 2013-04-22 NOTE — ED Notes (Signed)
Post ED Visit - Positive Culture Follow-up  Culture report reviewed by antimicrobial stewardship pharmacist: []  Wes Dulaney, Pharm.D., BCPS []  Celedonio Miyamoto, Pharm.D., BCPS []  Georgina Pillion, Pharm.D., BCPS []  [place], [address].D., BCPS, AAHIVP [x]  Estella Husk, Pharm.D., BCPS, AAHIVP  Positive urine culture Per Marlon Pel PA-C, no treatment recommended and no further patient follow-up is required at this time.  Kylie A Holland 04/22/2013, 12:32 PM

## 2013-05-24 DIAGNOSIS — Z87898 Personal history of other specified conditions: Secondary | ICD-10-CM

## 2013-07-01 ENCOUNTER — Encounter (HOSPITAL_COMMUNITY): Payer: Self-pay | Admitting: Emergency Medicine

## 2013-07-01 ENCOUNTER — Emergency Department (HOSPITAL_COMMUNITY)
Admission: EM | Admit: 2013-07-01 | Discharge: 2013-07-01 | Disposition: A | Discharge status: Home / Self Care | Payer: No Typology Code available for payment source | Attending: Emergency Medicine | Admitting: Emergency Medicine

## 2013-07-01 DIAGNOSIS — J45909 Unspecified asthma, uncomplicated: Secondary | ICD-10-CM | POA: Insufficient documentation

## 2013-07-01 DIAGNOSIS — IMO0001 Reserved for inherently not codable concepts without codable children: Secondary | ICD-10-CM | POA: Insufficient documentation

## 2013-07-01 DIAGNOSIS — K219 Gastro-esophageal reflux disease without esophagitis: Secondary | ICD-10-CM | POA: Insufficient documentation

## 2013-07-01 DIAGNOSIS — Z9884 Bariatric surgery status: Secondary | ICD-10-CM | POA: Insufficient documentation

## 2013-07-01 DIAGNOSIS — M5430 Sciatica, unspecified side: Secondary | ICD-10-CM | POA: Insufficient documentation

## 2013-07-01 DIAGNOSIS — Z791 Long term (current) use of non-steroidal anti-inflammatories (NSAID): Secondary | ICD-10-CM | POA: Insufficient documentation

## 2013-07-01 DIAGNOSIS — Z79899 Other long term (current) drug therapy: Secondary | ICD-10-CM | POA: Insufficient documentation

## 2013-07-01 DIAGNOSIS — Z862 Personal history of diseases of the blood and blood-forming organs and certain disorders involving the immune mechanism: Secondary | ICD-10-CM | POA: Insufficient documentation

## 2013-07-01 DIAGNOSIS — IMO0002 Reserved for concepts with insufficient information to code with codable children: Secondary | ICD-10-CM | POA: Insufficient documentation

## 2013-07-01 DIAGNOSIS — Z9109 Other allergy status, other than to drugs and biological substances: Secondary | ICD-10-CM | POA: Insufficient documentation

## 2013-07-01 DIAGNOSIS — Z7982 Long term (current) use of aspirin: Secondary | ICD-10-CM | POA: Insufficient documentation

## 2013-07-01 DIAGNOSIS — Z8669 Personal history of other diseases of the nervous system and sense organs: Secondary | ICD-10-CM | POA: Insufficient documentation

## 2013-07-01 DIAGNOSIS — F329 Major depressive disorder, single episode, unspecified: Secondary | ICD-10-CM | POA: Insufficient documentation

## 2013-07-01 DIAGNOSIS — Z8639 Personal history of other endocrine, nutritional and metabolic disease: Secondary | ICD-10-CM | POA: Insufficient documentation

## 2013-07-01 DIAGNOSIS — M6281 Muscle weakness (generalized): Secondary | ICD-10-CM | POA: Insufficient documentation

## 2013-07-01 DIAGNOSIS — F3289 Other specified depressive episodes: Secondary | ICD-10-CM | POA: Insufficient documentation

## 2013-07-01 MED ORDER — OXYCODONE-ACETAMINOPHEN 5-325 MG PO TABS: 1.0000 | ORAL_TABLET | Freq: Four times a day (QID) | ORAL | Status: DC | PRN

## 2013-07-01 NOTE — ED Notes (Signed)
PT ambulated with baseline gait; VSS; A&Ox3; no signs of distress; respirations even and unlabored; skin warm and dry; no questions upon discharge.  

## 2013-07-01 NOTE — Discharge Instructions (Signed)
Chronic Pain Discharge Instructions  °Emergency care providers appreciate that many patients coming to us are in severe pain and we wish to address their pain in the safest, most responsible manner.  It is important to recognize however, that the proper treatment of chronic pain differs from that of the pain of injuries and acute illnesses.  Our goal is to provide quality, safe, personalized care and we thank you for giving us the opportunity to serve you. °The use of narcotics and related agents for chronic pain syndromes may lead to additional physical and psychological problems.  Nearly as many people die from prescription narcotics each year as die from car crashes.  Additionally, this risk is increased if such prescriptions are obtained from a variety of sources.  Therefore, only your primary care physician or a pain management specialist is able to safely treat such syndromes with narcotic medications long-term.   ° °Documentation revealing such prescriptions have been sought from multiple sources may prohibit us from providing a refill or different narcotic medication.  Your name may be checked first through the [place] Controlled Substances Reporting System.  This database is a record of controlled substance medication prescriptions that the patient has received.  This has been established by [facility] in an effort to eliminate the dangerous, and often life threatening, practice of obtaining multiple prescriptions from different medical providers.  ° °If you have a chronic pain syndrome (i.e. chronic headaches, recurrent back or neck pain, dental pain, abdominal or pelvis pain without a specific diagnosis, or neuropathic pain such as fibromyalgia) or recurrent visits for the same condition without an acute diagnosis, you may be treated with non-narcotics and other non-addictive medicines.  Allergic reactions or negative side effects that may be reported by a patient to such medications will not  typically lead to the use of a narcotic analgesic or other controlled substance as an alternative. °  °Patients managing chronic pain with a personal physician should have provisions in place for breakthrough pain.  If you are in crisis, you should call your physician.  If your physician directs you to the emergency department, please have the doctor call and speak to our attending physician concerning your care. °  °When patients come to the Emergency Department (ED) with acute medical conditions in which the Emergency Department physician feels appropriate to prescribe narcotic or sedating pain medication, the physician will prescribe these in very limited quantities.  The amount of these medications will last only until you can see your primary care physician in his/her office.  Any patient who returns to the ED seeking refills should expect only non-narcotic pain medications.  ° °In the event of an acute medical condition exists and the emergency physician feels it is necessary that the patient be given a narcotic or sedating medication -  a responsible adult driver should be present in the room prior to the medication being given by the nurse. °  °Prescriptions for narcotic or sedating medications that have been lost, stolen or expired will not be refilled in the Emergency Department.   ° °Patients who have chronic pain may receive non-narcotic prescriptions until seen by their primary care physician.  It is every patient’s personal responsibility to maintain active prescriptions with his or her primary care physician or specialist. ° °SEEK IMMEDIATE MEDICAL ATTENTION IF: °New numbness, tingling, weakness, or problem with the use of your arms or legs.  °Severe back pain not relieved with medications.  °Change in bowel or bladder control.  °  Increasing pain in any areas of the body (such as chest or abdominal pain).  °Shortness of breath, dizziness or fainting.  °Nausea (feeling sick to your stomach), vomiting,  fever, or sweats. ° °

## 2013-07-01 NOTE — ED Notes (Signed)
Pt states she has sciatica and fibromyalgia and her legs and back hurt. She took her last oxycodone pill yesterday.

## 2013-07-01 NOTE — ED Provider Notes (Signed)
CSN: [id number]     Arrival date & time 07/01/13  6160 History  This chart was scribed for Arthor Captain, PA-C, working with Darlys Gales, MD by Blanchard Kelch, ED Scribe. This patient was seen in room TR08C/TR08C and the patient's care was started at 7:09 PM.    Chief Complaint  Patient presents with  . Leg Pain    Patient is a 48 y.o. female presenting with leg pain. The history is provided by the patient. No language interpreter was used.  Leg Pain Associated symptoms: back pain   Associated symptoms: no fever     HPI Comments: [name] is a 48 y.o. female with a history of fibromyalgia, sciatica, depression, and recurrent syncope who presents to the Emergency Department complaining of constant lower back and bilateral leg pain that began three days ago. She describes the pain as shooting and sharp. She reports associated weakness and numbness in her legs. She denies bowel or bladder incontinence. She states that she injured herself five years ago which triggered the pain during an episode of syncope. She states that she was been taking robaxin, gabapentin and oxycodone with significant relief, but she ran out of the oxycodone two days ago. She states that she comes here to get prescribed the oxycodone and was last prescribed it at the ED at [facility] (15 tablets) that lasted her until this visit.   [facility] is her PCP but she states she doesn't go there because they won't prescribe narcotics.    Past Medical History  Diagnosis Date  . Asthma   . GERD (gastroesophageal reflux disease)   . Grave's disease   . Fibromyalgia   . Environmental allergies   . Depression   . Sciatica    Past Surgical History  Procedure Laterality Date  . Abdominal hysterectomy      partial  . Gastric bypass  2008   Family History  Problem Relation Age of Onset  . Hypertension Mother   . Cirrhosis Mother   . Hypertension Sister   . Diabetes Other   . Hypertension Other    . Diabetes Maternal Uncle   . Hypertension Father    History  Substance Use Topics  . Smoking status: Never Smoker   . Smokeless tobacco: Not on file  . Alcohol Use: No   OB History   Grav Para Term Preterm Abortions TAB SAB Ect Mult Living   2 2        2      Review of Systems  Constitutional: Negative for fever and chills.  Musculoskeletal: Positive for back pain and myalgias.    Allergies  Mushroom ext cmplx(shiitake-reishi-mait); Nutritional supplements; Shellfish allergy; and Hydrocodone-acetaminophen  Home Medications   Current Outpatient Rx  Name  Route  Sig  Dispense  Refill  . albuterol (PROVENTIL HFA;VENTOLIN HFA) 108 (90 BASE) MCG/ACT inhaler   Inhalation   Inhale 2 puffs into the lungs every 6 (six) hours as needed for wheezing.         ALPRAZolam (XANAX) 0.5 MG tablet   Oral   Take 0.5 mg by mouth 2 (two) times daily as needed for sleep.         Marland Kitchen aspirin 81 MG chewable tablet   Oral   Chew 81 mg by mouth daily.         Marland Kitchen azelastine (ASTELIN) 137 MCG/SPRAY nasal spray   Nasal   Place 1 spray into the nose 2 (two) times daily. Use  in each nostril as directed         . BIOTIN 5000 PO   Oral   Take 1 tablet by mouth daily.         . calcium carbonate (TUMS - DOSED IN MG ELEMENTAL CALCIUM) 500 MG chewable tablet   Oral   Chew 3 tablets by mouth daily as needed for heartburn.          . cholecalciferol (VITAMIN D) 1000 UNITS tablet   Oral   Take 2,000 Units by mouth daily.         Marland Kitchen Epinastine HCl (ELESTAT) 0.05 % ophthalmic solution   Both Eyes   Place 1 drop into both eyes 2 (two) times daily.         Marland Kitchen EPINEPHrine (EPIPEN) 0.3 mg/0.3 mL DEVI   Intramuscular   Inject 0.3 mg into the muscle as needed. For allergic reaction to stinging insects, mushrooms, and shellfish          . estradiol (ESTRACE) 0.5 MG tablet   Oral   Take 0.5 mg by mouth daily.         Marland Kitchen etodolac (LODINE) 500 MG tablet   Oral   Take 1 tablet (500  mg total) by mouth 2 (two) times daily.   20 tablet   0   . ibuprofen (ADVIL,MOTRIN) 200 MG tablet   Oral   Take 600 mg by mouth every 6 (six) hours as needed for pain (back pain).         . meloxicam (MOBIC) 7.5 MG tablet   Oral   Take 7.5 mg by mouth 2 (two) times daily.          . mometasone-formoterol (DULERA) 100-5 MCG/ACT AERO   Inhalation   Inhale 2 puffs into the lungs 2 (two) times daily as needed for wheezing or shortness of breath.          . Multiple Vitamins-Minerals (MULTIVITAMIN WITH MINERALS) tablet   Oral   Take 1 tablet by mouth daily.         Marland Kitchen oxyCODONE-acetaminophen (PERCOCET/ROXICET) 5-325 MG per tablet   Oral   Take 1-2 tablets by mouth every 6 (six) hours as needed for severe pain.   15 tablet   0   . topiramate (TOPAMAX) 25 MG tablet   Oral   Take 100 mg by mouth 2 (two) times daily.          Triage Vitals: BP 99/53  Pulse 92  Temp(Src) 98.6 F (37 C) (Oral)  Resp 15  Ht 5\' 5"  (1.651 m)  Wt 171 lb (77.565 kg)  BMI 28.46 kg/m2  SpO2 98%  Physical Exam  Nursing note and vitals reviewed. Constitutional: She is oriented to person, place, and time. She appears well-developed and well-nourished. No distress.  HENT:  Head: Normocephalic and atraumatic.  Eyes: EOM are normal.  Neck: Neck supple. No tracheal deviation present.  Cardiovascular: Normal rate.   Pulmonary/Chest: Effort normal. No respiratory distress.  Musculoskeletal: Normal range of motion.  Neurological: She is alert and oriented to person, place, and time.  Skin: Skin is warm and dry.  Psychiatric: She has a normal mood and affect. Her behavior is normal.    ED Course  Procedures (including critical care time)  DIAGNOSTIC STUDIES: Oxygen Saturation is 98% on room air, normal by my interpretation.    COORDINATION OF CARE: 7:16 PM -Will look up patient on [place] narcotics database.  Patient verbalizes understanding and agrees with treatment plan.  Labs  Review Labs Reviewed - No data to display Imaging Review No results found.  EKG Interpretation   None       MDM   1. Sciatica    I did review the patient's chart and see that her last script was given on the date that she specific. I also looked the patient up on the [facility] and saw no other rx. I have agreed to give her one script but have advised her that the ED does not treat chronic pain and she will need her PCP to refer her to pain management. Patient expresses her understanding and agrees with POC.  I personally performed the services described in this documentation, which was scribed in my presence. The recorded information has been reviewed and is accurate.     Arthor Captain, PA-C 07/03/13 2225

## 2013-07-01 NOTE — ED Notes (Signed)
MD at bedside. 

## 2013-07-04 NOTE — ED Provider Notes (Signed)
Medical screening examination/treatment/procedure(s) were performed by non-physician practitioner and as supervising physician I was immediately available for consultation/collaboration.  EKG Interpretation   None         Niklaus Mamaril, MD 07/04/13 0943 

## 2013-07-13 ENCOUNTER — Encounter (HOSPITAL_COMMUNITY): Payer: Self-pay | Admitting: Emergency Medicine

## 2013-07-13 ENCOUNTER — Emergency Department (INDEPENDENT_AMBULATORY_CARE_PROVIDER_SITE_OTHER)
Admission: EM | Admit: 2013-07-13 | Discharge: 2013-07-13 | Disposition: A | Discharge status: Home / Self Care | Payer: No Typology Code available for payment source | Source: Home / Self Care | Attending: Family Medicine | Admitting: Family Medicine

## 2013-07-13 ENCOUNTER — Emergency Department (INDEPENDENT_AMBULATORY_CARE_PROVIDER_SITE_OTHER): Payer: No Typology Code available for payment source

## 2013-07-13 DIAGNOSIS — G4440 Drug-induced headache, not elsewhere classified, not intractable: Secondary | ICD-10-CM

## 2013-07-13 DIAGNOSIS — G4700 Insomnia, unspecified: Secondary | ICD-10-CM

## 2013-07-13 LAB — POCT I-STAT, CHEM 8
BUN: 14 mg/dL (ref 6–23)
Calcium, Ion: 1.2 mmol/L (ref 1.12–1.23)
Chloride: 106 mEq/L (ref 96–112)
Creatinine, Ser: 0.8 mg/dL (ref 0.50–1.10)
Glucose, Bld: 80 mg/dL (ref 70–99)
HCT: 43 % (ref 36.0–46.0)
Hemoglobin: 14.6 g/dL (ref 12.0–15.0)
Potassium: 3.7 mEq/L (ref 3.7–5.3)
Sodium: 143 mEq/L (ref 137–147)
TCO2: 25 mmol/L (ref 0–100)

## 2013-07-13 MED ORDER — ZOLPIDEM TARTRATE 5 MG PO TABS: 5.0000 mg | ORAL_TABLET | Freq: Every evening | ORAL | Status: DC | PRN

## 2013-07-13 NOTE — ED Notes (Signed)
C/o headache x 5 wks.  Hx of migraines.  States that she was seen by pcp and given meds for sinus with no relief.  States has pressure between eyes.  N/v. Sneezing.  And irritated throat.
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
## 2013-07-13 NOTE — Discharge Instructions (Signed)
Insomnia Insomnia is frequent trouble falling and/or staying asleep. Insomnia can be a long term problem or a short term problem. Both are common. Insomnia can be a short term problem when the wakefulness is related to a certain stress or worry. Long term insomnia is often related to ongoing stress during waking hours and/or poor sleeping habits. Overtime, sleep deprivation itself can make the problem worse. Every little thing feels more severe because you are overtired and your ability to cope is decreased. CAUSES   Stress, anxiety, and depression.  Poor sleeping habits.  Distractions such as TV in the bedroom.  Naps close to bedtime.  Engaging in emotionally charged conversations before bed.  Technical reading before sleep.  Alcohol and other sedatives. They may make the problem worse. They can hurt normal sleep patterns and normal dream activity.  Stimulants such as caffeine for several hours prior to bedtime.  Pain syndromes and shortness of breath can cause insomnia.  Exercise late at night.  Changing time zones may cause sleeping problems (jet lag). It is sometimes helpful to have someone observe your sleeping patterns. They should look for periods of not breathing during the night (sleep apnea). They should also look to see how long those periods last. If you live alone or observers are uncertain, you can also be observed at a sleep clinic where your sleep patterns will be professionally monitored. Sleep apnea requires a checkup and treatment. Give your caregivers your medical history. Give your caregivers observations your family has made about your sleep.  SYMPTOMS   Not feeling rested in the morning.  Anxiety and restlessness at bedtime.  Difficulty falling and staying asleep. TREATMENT   Your caregiver may prescribe treatment for an underlying medical disorders. Your caregiver can give advice or help if you are using alcohol or other drugs for self-medication. Treatment  of underlying problems will usually eliminate insomnia problems.  Medications can be prescribed for short time use. They are generally not recommended for lengthy use.  Over-the-counter sleep medicines are not recommended for lengthy use. They can be habit forming.  You can promote easier sleeping by making lifestyle changes such as:  Using relaxation techniques that help with breathing and reduce muscle tension.  Exercising earlier in the day.  Changing your diet and the time of your last meal. No night time snacks.  Establish a regular time to go to bed.  Counseling can help with stressful problems and worry.  Soothing music and white noise may be helpful if there are background noises you cannot remove.  Stop tedious detailed work at least one hour before bedtime. HOME CARE INSTRUCTIONS   Keep a diary. Inform your caregiver about your progress. This includes any medication side effects. See your caregiver regularly. Take note of:  Times when you are asleep.  Times when you are awake during the night.  The quality of your sleep.  How you feel the next day. This information will help your caregiver care for you.  Get out of bed if you are still awake after 15 minutes. Read or do some quiet activity. Keep the lights down. Wait until you feel sleepy and go back to bed.  Keep regular sleeping and waking hours. Avoid naps.  Exercise regularly.  Avoid distractions at bedtime. Distractions include watching television or engaging in any intense or detailed activity like attempting to balance the household checkbook.  Develop a bedtime ritual. Keep a familiar routine of bathing, brushing your teeth, climbing into bed at the same  time each night, listening to soothing music. Routines increase the success of falling to sleep faster.  Use relaxation techniques. This can be using breathing and muscle tension release routines. It can also include visualizing peaceful scenes. You can  also help control troubling or intruding thoughts by keeping your mind occupied with boring or repetitive thoughts like the old concept of counting sheep. You can make it more creative like imagining planting one beautiful flower after another in your backyard garden.  During your day, work to eliminate stress. When this is not possible use some of the previous suggestions to help reduce the anxiety that accompanies stressful situations. MAKE SURE YOU:   Understand these instructions.  Will watch your condition.  Will get help right away if you are not doing well or get worse. Document Released: 05/07/2000 Document Revised: 08/02/2011 Document Reviewed: 06/07/2007 [facility] Patient Information 2014 [place], [place]. Headaches, Frequently Asked Questions MIGRAINE HEADACHES Q: What is migraine? What causes it? How can I treat it? A: Generally, migraine headaches begin as a dull ache. Then they develop into a constant, throbbing, and pulsating pain. You may experience pain at the temples. You may experience pain at the front or back of one or both sides of the head. The pain is usually accompanied by a combination of:  Nausea.  Vomiting.  Sensitivity to light and noise. Some people (about 15%) experience an aura (see below) before an attack. The cause of migraine is believed to be chemical reactions in the brain. Treatment for migraine may include over-the-counter or prescription medications. It may also include self-help techniques. These include relaxation training and biofeedback.  Q: What is an aura? A: About 15% of people with migraine get an "aura". This is a sign of neurological symptoms that occur before a migraine headache. You may see wavy or jagged lines, dots, or flashing lights. You might experience tunnel vision or blind spots in one or both eyes. The aura can include visual or auditory hallucinations (something imagined). It may include disruptions in smell (such as strange odors),  taste or touch. Other symptoms include:  Numbness.  A "pins and needles" sensation.  Difficulty in recalling or speaking the correct word. These neurological events may last as long as 60 minutes. These symptoms will fade as the headache begins. Q: What is a trigger? A: Certain physical or environmental factors can lead to or "trigger" a migraine. These include:  Foods.  Hormonal changes.  Weather.  Stress. It is important to remember that triggers are different for everyone. To help prevent migraine attacks, you need to figure out which triggers affect you. Keep a headache diary. This is a good way to track triggers. The diary will help you talk to your healthcare professional about your condition. Q: Does weather affect migraines? A: Bright sunshine, hot, humid conditions, and drastic changes in barometric pressure may lead to, or "trigger," a migraine attack in some people. But studies have shown that weather does not act as a trigger for everyone with migraines. Q: What is the link between migraine and hormones? A: Hormones start and regulate many of your body's functions. Hormones keep your body in balance within a constantly changing environment. The levels of hormones in your body are unbalanced at times. Examples are during menstruation, pregnancy, or menopause. That can lead to a migraine attack. In fact, about three quarters of all women with migraine report that their attacks are related to the menstrual cycle.  Q: Is there an increased risk of  stroke for migraine sufferers? A: The likelihood of a migraine attack causing a stroke is very remote. That is not to say that migraine sufferers cannot have a stroke associated with their migraines. In persons under age 31, the most common associated factor for stroke is migraine headache. But over the course of a person's normal life span, the occurrence of migraine headache may actually be associated with a reduced risk of dying from  cerebrovascular disease due to stroke.  Q: What are acute medications for migraine? A: Acute medications are used to treat the pain of the headache after it has started. Examples over-the-counter medications, NSAIDs, ergots, and triptans.  Q: What are the triptans? A: Triptans are the newest class of abortive medications. They are specifically targeted to treat migraine. Triptans are vasoconstrictors. They moderate some chemical reactions in the brain. The triptans work on receptors in your brain. Triptans help to restore the balance of a neurotransmitter called serotonin. Fluctuations in levels of serotonin are thought to be a main cause of migraine.  Q: Are over-the-counter medications for migraine effective? A: Over-the-counter, or "OTC," medications may be effective in relieving mild to moderate pain and associated symptoms of migraine. But you should see your caregiver before beginning any treatment regimen for migraine.  Q: What are preventive medications for migraine? A: Preventive medications for migraine are sometimes referred to as "prophylactic" treatments. They are used to reduce the frequency, severity, and length of migraine attacks. Examples of preventive medications include antiepileptic medications, antidepressants, beta-blockers, calcium channel blockers, and NSAIDs (nonsteroidal anti-inflammatory drugs). Q: Why are anticonvulsants used to treat migraine? A: During the past few years, there has been an increased interest in antiepileptic drugs for the prevention of migraine. They are sometimes referred to as "anticonvulsants". Both epilepsy and migraine may be caused by similar reactions in the brain.  Q: Why are antidepressants used to treat migraine? A: Antidepressants are typically used to treat people with depression. They may reduce migraine frequency by regulating chemical levels, such as serotonin, in the brain.  Q: What alternative therapies are used to treat migraine? A: The  term "alternative therapies" is often used to describe treatments considered outside the scope of conventional Western medicine. Examples of alternative therapy include acupuncture, acupressure, and yoga. Another common alternative treatment is herbal therapy. Some herbs are believed to relieve headache pain. Always discuss alternative therapies with your caregiver before proceeding. Some herbal products contain arsenic and other toxins. TENSION HEADACHES Q: What is a tension-type headache? What causes it? How can I treat it? A: Tension-type headaches occur randomly. They are often the result of temporary stress, anxiety, fatigue, or anger. Symptoms include soreness in your temples, a tightening band-like sensation around your head (a "vice-like" ache). Symptoms can also include a pulling feeling, pressure sensations, and contracting head and neck muscles. The headache begins in your forehead, temples, or the back of your head and neck. Treatment for tension-type headache may include over-the-counter or prescription medications. Treatment may also include self-help techniques such as relaxation training and biofeedback. CLUSTER HEADACHES Q: What is a cluster headache? What causes it? How can I treat it? A: Cluster headache gets its name because the attacks come in groups. The pain arrives with little, if any, warning. It is usually on one side of the head. A tearing or bloodshot eye and a runny nose on the same side of the headache may also accompany the pain. Cluster headaches are believed to be caused by chemical reactions in the brain.  They have been described as the most severe and intense of any headache type. Treatment for cluster headache includes prescription medication and oxygen. SINUS HEADACHES Q: What is a sinus headache? What causes it? How can I treat it? A: When a cavity in the bones of the face and skull (a sinus) becomes inflamed, the inflammation will cause localized pain. This condition  is usually the result of an allergic reaction, a tumor, or an infection. If your headache is caused by a sinus blockage, such as an infection, you will probably have a fever. An x-ray will confirm a sinus blockage. Your caregiver's treatment might include antibiotics for the infection, as well as antihistamines or decongestants.  REBOUND HEADACHES Q: What is a rebound headache? What causes it? How can I treat it? A: A pattern of taking acute headache medications too often can lead to a condition known as "rebound headache." A pattern of taking too much headache medication includes taking it more than 2 days per week or in excessive amounts. That means more than the label or a caregiver advises. With rebound headaches, your medications not only stop relieving pain, they actually begin to cause headaches. Doctors treat rebound headache by tapering the medication that is being overused. Sometimes your caregiver will gradually substitute a different type of treatment or medication. Stopping may be a challenge. Regularly overusing a medication increases the potential for serious side effects. Consult a caregiver if you regularly use headache medications more than 2 days per week or more than the label advises. ADDITIONAL QUESTIONS AND ANSWERS Q: What is biofeedback? A: Biofeedback is a self-help treatment. Biofeedback uses special equipment to monitor your body's involuntary physical responses. Biofeedback monitors:  Breathing.  Pulse.  Heart rate.  Temperature.  Muscle tension.  Brain activity. Biofeedback helps you refine and perfect your relaxation exercises. You learn to control the physical responses that are related to stress. Once the technique has been mastered, you do not need the equipment any more. Q: Are headaches hereditary? A: Four out of five (80%) of people that suffer report a family history of migraine. Scientists are not sure if this is genetic or a family predisposition. Despite  the uncertainty, a child has a 50% chance of having migraine if one parent suffers. The child has a 75% chance if both parents suffer.  Q: Can children get headaches? A: By the time they reach high school, most young people have experienced some type of headache. Many safe and effective approaches or medications can prevent a headache from occurring or stop it after it has begun.  Q: What type of doctor should I see to diagnose and treat my headache? A: Start with your primary caregiver. Discuss his or her experience and approach to headaches. Discuss methods of classification, diagnosis, and treatment. Your caregiver may decide to recommend you to a headache specialist, depending upon your symptoms or other physical conditions. Having diabetes, allergies, etc., may require a more comprehensive and inclusive approach to your headache. The [employer] will provide, upon request, a list of [facility] physician members in your state. Document Released: 07/31/2003 Document Revised: 08/02/2011 Document Reviewed: 01/08/2008 [facility] Patient Information 2014 [place], [place].

## 2013-07-15 NOTE — ED Provider Notes (Signed)
Medical screening examination/treatment/procedure(s) were performed by a resident physician or non-physician practitioner and as the supervising physician I was immediately available for consultation/collaboration.  Denice Cardon, MD    Jovonta Levit S Alfreda Hammad, MD 07/15/13 0833 

## 2013-08-28 ENCOUNTER — Emergency Department (HOSPITAL_COMMUNITY)
Admission: EM | Admit: 2013-08-28 | Discharge: 2013-08-28 | Disposition: A | Discharge status: Home / Self Care | Payer: Medicaid Other | Attending: Emergency Medicine | Admitting: Emergency Medicine

## 2013-08-28 ENCOUNTER — Encounter (HOSPITAL_COMMUNITY): Payer: Self-pay | Admitting: Emergency Medicine

## 2013-08-28 DIAGNOSIS — Z791 Long term (current) use of non-steroidal anti-inflammatories (NSAID): Secondary | ICD-10-CM | POA: Diagnosis not present

## 2013-08-28 DIAGNOSIS — M546 Pain in thoracic spine: Secondary | ICD-10-CM | POA: Insufficient documentation

## 2013-08-28 DIAGNOSIS — R509 Fever, unspecified: Secondary | ICD-10-CM | POA: Insufficient documentation

## 2013-08-28 DIAGNOSIS — J45909 Unspecified asthma, uncomplicated: Secondary | ICD-10-CM | POA: Insufficient documentation

## 2013-08-28 DIAGNOSIS — Z862 Personal history of diseases of the blood and blood-forming organs and certain disorders involving the immune mechanism: Secondary | ICD-10-CM | POA: Diagnosis not present

## 2013-08-28 DIAGNOSIS — R350 Frequency of micturition: Secondary | ICD-10-CM | POA: Insufficient documentation

## 2013-08-28 DIAGNOSIS — M545 Low back pain, unspecified: Secondary | ICD-10-CM | POA: Diagnosis present

## 2013-08-28 DIAGNOSIS — Z79899 Other long term (current) drug therapy: Secondary | ICD-10-CM | POA: Diagnosis not present

## 2013-08-28 DIAGNOSIS — F411 Generalized anxiety disorder: Secondary | ICD-10-CM | POA: Diagnosis not present

## 2013-08-28 DIAGNOSIS — G4700 Insomnia, unspecified: Secondary | ICD-10-CM | POA: Insufficient documentation

## 2013-08-28 DIAGNOSIS — G8929 Other chronic pain: Secondary | ICD-10-CM | POA: Diagnosis not present

## 2013-08-28 DIAGNOSIS — Z8639 Personal history of other endocrine, nutritional and metabolic disease: Secondary | ICD-10-CM | POA: Insufficient documentation

## 2013-08-28 DIAGNOSIS — IMO0002 Reserved for concepts with insufficient information to code with codable children: Secondary | ICD-10-CM | POA: Diagnosis not present

## 2013-08-28 DIAGNOSIS — M542 Cervicalgia: Secondary | ICD-10-CM | POA: Insufficient documentation

## 2013-08-28 DIAGNOSIS — K5900 Constipation, unspecified: Secondary | ICD-10-CM | POA: Insufficient documentation

## 2013-08-28 DIAGNOSIS — M549 Dorsalgia, unspecified: Secondary | ICD-10-CM

## 2013-08-28 MED ORDER — DIAZEPAM 5 MG PO TABS: ORAL_TABLET | ORAL | Status: DC

## 2013-08-28 MED ORDER — OXYCODONE-ACETAMINOPHEN 5-325 MG PO TABS: 1.0000 | ORAL_TABLET | Freq: Four times a day (QID) | ORAL | Status: DC | PRN

## 2013-08-28 MED ORDER — PREDNISONE 20 MG PO TABS
ORAL_TABLET | ORAL | Status: DC
Start: 2013-08-28 — End: 2013-10-12

## 2013-08-28 NOTE — ED Notes (Signed)
Pt reports hx of sciatica. Was seen in ED 2/8 for same. Ran out of pain medications on 3/27. Sciatica pain 10/10 at present.  Reports pain has never been this bad before. Pt ran out of ambien 2 weeks ago now has insomnia and anxiety.

## 2013-08-28 NOTE — ED Notes (Signed)
PA spoke with pt about medication. Pt verbalized understanding. Pt escorted to discharge window at this time

## 2013-08-28 NOTE — Discharge Instructions (Signed)
Please read and follow all provided instructions.  Your diagnoses today include:  1. Chronic back pain    Tests performed today include:  Vital signs - see below for your results today  Medications prescribed:   Percocet (oxycodone/acetaminophen) - narcotic pain medication  DO NOT drive or perform any activities that require you to be awake and alert because this medicine can make you drowsy. BE VERY CAREFUL not to take multiple medicines containing Tylenol (also called acetaminophen). Doing so can lead to an overdose which can damage your liver and cause liver failure and possibly death.   Valium - muscle relaxer medication  DO NOT drive or perform any activities that require you to be awake and alert because this medicine can make you drowsy. Do not take this with your currently prescribed methocarbamol (Robaxin) as we discussed.     Prednisone - steroid medicine   It is best to take this medication in the morning to prevent sleeping problems. If you are diabetic, monitor your blood sugar closely and stop taking Prednisone if blood sugar is over 300. Take with food to prevent stomach upset.   Take any prescribed medications only as directed.  Home care instructions:   Follow any educational materials contained in this packet  Please rest, use ice or heat on your back for the next several days  Do not lift, push, pull anything more than 10 pounds for the next week  Follow-up instructions: Please follow-up with your primary care provider in the next 1 week for further evaluation of your symptoms. If you do not have a primary care doctor -- see below for referral information.   Return instructions:  SEEK IMMEDIATE MEDICAL ATTENTION IF YOU HAVE:  New numbness, tingling, weakness, or problem with the use of your arms or legs  Severe back pain not relieved with medications  Loss control of your bowels or bladder  Increasing pain in any areas of the body (such as chest or  abdominal pain)  Shortness of breath, dizziness, or fainting.   Worsening nausea (feeling sick to your stomach), vomiting, fever, or sweats  Any other emergent concerns regarding your health   Additional Information:  Your vital signs today were: BP 112/60   Pulse 79   Temp(Src) 98.2 F (36.8 C) (Oral)   Resp 16   SpO2 100% If your blood pressure (BP) was elevated above 135/85 this visit, please have this repeated by your doctor within one month. --------------

## 2013-08-28 NOTE — ED Notes (Signed)
Pt stating that her FNP cannot prescribe Ambien and sent her to the ED for a refill. PA has left at this time, will speak with the oncoming PA. Pt was advised that the Valium prescribed for muscle relaxation at bedtime should help her sleep, as well and that Ambien is not usually prescribed in the ED setting. Pt stating that [facility] has prescribed her this in the past.

## 2013-08-28 NOTE — ED Provider Notes (Signed)
CSN: [id number]     Arrival date & time 08/28/13  1732 History  This chart was scribed for Rhea Bleacher, PA, working with Donnetta Hutching, MD, by Ardelia Mems ED Scribe. This patient was seen in room WTR7/WTR7 and the patient's care was started at 7:08 PM.   Chief Complaint  Patient presents with  . sciatic leg pain   . Insomnia  . Anxiety    The history is provided by the patient. No language interpreter was used.    HPI Comments: [name] is a 48 y.o. female with a history of sciatica and fibromyalgia who presents to the Emergency Department complaining of intermittent, moderate lower and middle back pain described as "stiffness" that radiates to her bilateral legs (left worse than right) over the past 6 weeks. Pt was seen in the ED on 07/01/13 with similar symptoms. Pt reports that she has been constipated recently. She further reports associated urinary frequency recently. She states that her back pain is worsened with lying supine. She states that she has been prescribed Mobic, Robaxin and Oxycodone which she has taken with mild relief of her pain. She reports that she has had an MRI of her back showing "a curved disc". She states that she had a sinus infection recently, with a fever as high as 102 F, but states this has resolved. No unexplained chronic fevers. She denies saddle anesthesia or any other symptoms.  She has a secondary complaint of insomnia with associated anxiety over the past 2 weeks. She reports that she ran out of Ambien 2 weeks ago, and believes this to be the cause.    Past Medical History  Diagnosis Date  . Asthma   . GERD (gastroesophageal reflux disease)   . Grave's disease   . Fibromyalgia   . Environmental allergies   . Depression   . Sciatica    Past Surgical History  Procedure Laterality Date  . Abdominal hysterectomy      partial  . Gastric bypass  2008   Family History  Problem Relation Age of Onset  . Hypertension Mother   . Cirrhosis Mother    . Hypertension Sister   . Diabetes Other   . Hypertension Other   . Diabetes Maternal Uncle   . Hypertension Father    History  Substance Use Topics  . Smoking status: Never Smoker   . Smokeless tobacco: Not on file  . Alcohol Use: No   OB History   Grav Para Term Preterm Abortions TAB SAB Ect Mult Living   2 2        2      Review of Systems  Constitutional: Positive for fever (resolved). Negative for unexpected weight change.  Gastrointestinal: Positive for constipation.       Occasional bowel incontinence  Genitourinary: Positive for frequency. Negative for dysuria, hematuria, flank pain, vaginal bleeding, vaginal discharge and pelvic pain.       Negative for urinary incontinence or retention.  Musculoskeletal: Positive for back pain and myalgias.  Neurological: Negative for weakness and numbness.       Denies saddle anesthesia  Psychiatric/Behavioral: Positive for sleep disturbance. The patient is nervous/anxious.     Allergies  Mushroom ext cmplx(shiitake-reishi-mait); Nutritional supplements; Shellfish allergy; and Hydrocodone-acetaminophen  Home Medications   Current Outpatient Rx  Name  Route  Sig  Dispense  Refill  . albuterol (PROVENTIL HFA;VENTOLIN HFA) 108 (90 BASE) MCG/ACT inhaler   Inhalation   Inhale 2 puffs into the lungs every 6 (six)  hours as needed for wheezing.         Marland Kitchen azelastine (ASTELIN) 137 MCG/SPRAY nasal spray   Nasal   Place 1 spray into the nose 2 (two) times daily. Use in each nostril as directed         . BIOTIN 5000 PO   Oral   Take 1 tablet by mouth daily.         . cholecalciferol (VITAMIN D) 1000 UNITS tablet   Oral   Take 2,000 Units by mouth daily.         Marland Kitchen Epinastine HCl (ELESTAT) 0.05 % ophthalmic solution   Both Eyes   Place 1 drop into both eyes 2 (two) times daily.         Marland Kitchen estradiol (ESTRACE) 0.5 MG tablet   Oral   Take 0.5 mg by mouth daily.         . meloxicam (MOBIC) 7.5 MG tablet   Oral   Take  7.5 mg by mouth 2 (two) times daily.          . mometasone-formoterol (DULERA) 100-5 MCG/ACT AERO   Inhalation   Inhale 2 puffs into the lungs 2 (two) times daily as needed for wheezing or shortness of breath.          . Multiple Vitamins-Minerals (MULTIVITAMIN WITH MINERALS) tablet   Oral   Take 1 tablet by mouth daily.         Marland Kitchen oxyCODONE-acetaminophen (PERCOCET/ROXICET) 5-325 MG per tablet   Oral   Take 1 tablet by mouth every 6 (six) hours as needed for severe pain.   15 tablet   0   . topiramate (TOPAMAX) 25 MG tablet   Oral   Take 100 mg by mouth 2 (two) times daily.         Marland Kitchen zolpidem (AMBIEN) 5 MG tablet   Oral   Take 5 mg by mouth at bedtime as needed for sleep.         Marland Kitchen EPINEPHrine (EPIPEN) 0.3 mg/0.3 mL DEVI   Intramuscular   Inject 0.3 mg into the muscle as needed. For allergic reaction to stinging insects, mushrooms, and shellfish           Triage Vitals: BP 112/60  Pulse 79  Temp(Src) 98.2 F (36.8 C) (Oral)  Resp 16  SpO2 100%  Physical Exam  Nursing note and vitals reviewed. Constitutional: She appears well-developed and well-nourished. No distress.  HENT:  Head: Normocephalic and atraumatic.  Eyes: Conjunctivae and EOM are normal.  Neck: Normal range of motion. Neck supple. No tracheal deviation present.  Cardiovascular: Normal rate.   Pulmonary/Chest: Effort normal. No respiratory distress.  Abdominal: Soft. There is no tenderness. There is no CVA tenderness.  Musculoskeletal: Normal range of motion. She exhibits tenderness.       Cervical back: She exhibits tenderness. She exhibits no bony tenderness.       Thoracic back: She exhibits tenderness. She exhibits no bony tenderness.       Lumbar back: She exhibits tenderness. She exhibits no bony tenderness.  Paraspinal lumbar and cervical tenderness  Neurological: She is alert. She has normal strength and normal reflexes. No sensory deficit.  5/5 strength in entire lower extremities  bilaterally. No sensation deficit. No hyperreflexia.   Skin: Skin is warm and dry. No rash noted.  Psychiatric: She has a normal mood and affect. Her behavior is normal.    ED Course  Procedures (including critical care time)  DIAGNOSTIC STUDIES: Oxygen Saturation  is 100% on RA, normal by my interpretation.    COORDINATION OF CARE: 7:20 PM- Discussed that imaging is not indicated. Discussed current pain control strategy and what more we could offer today. Advised pt of option to receive steroids and a small amount of Oxycodone, and pt agrees that this is a good option. Will place on valium at bedtime and have patient d/c robaxin. Pt advised of plan for treatment and pt agrees.  Strongly encouraged PCP f/u.   Labs Review Labs Reviewed - No data to display Imaging Review No results found.   EKG Interpretation None      8:11 PM Patient seen and examined.    Vital signs reviewed and are as follows: Filed Vitals:   08/28/13 2003  BP: 105/63  Pulse: 76  Temp:   Resp:   BP 105/63  Pulse 76  Temp(Src) 98.2 F (36.8 C) (Oral)  Resp 16  SpO2 100%  No red flag s/s of low back pain. Patient was counseled on back pain precautions and told to do activity as tolerated but do not lift, push, or pull heavy objects more than 10 pounds for the next week.  Patient counseled to use ice or heat on back for no longer than 15 minutes every hour.   Patient prescribed muscle relaxer and counseled on proper use of muscle relaxant medication. Pt to d/c robaxin.     Patient prescribed narcotic pain medicine and counseled on proper use of narcotic pain medications. She is to use this only as needed. Counseled not to combine this medication with others containing tylenol.   Urged patient not to drink alcohol, drive, or perform any other activities that requires focus while taking either of these medications.  Patient urged to follow-up with PCP if pain does not improve with treatment and rest or  if pain becomes recurrent. Urged to return with worsening severe pain, loss of bowel or bladder control, trouble walking.   The patient verbalizes understanding and agrees with the plan.   MDM   Final diagnoses:  Chronic back pain    Patient with back pain, radiculopathy, chronic. She is already taking NSAID, muscle relaxer, occasional narcotic pain medication.   Will add steroid as she has had some benefit from this in past.   Patient c/o running out of Ambien. In place, will use valium for muscle relaxation at bedtime as well as anti-anxiety effect will likely assist with sleep. Pt instructed not to take with robaxin.   She needs to have PCP f/u for her chronic pain.   Otherwise, no neurological deficits. Patient is ambulatory. No warning symptoms of back pain including: loss of bowel or bladder control, night sweats, waking from sleep with back pain, unexplained fevers or weight loss, h/o cancer, IVDU, recent trauma. No concern for cauda equina, epidural abscess, or other serious cause of back pain. Conservative measures such as rest, ice/heat and pain medicine indicated with PCP follow-up if no improvement with conservative management. Imaging not indicated today.   I personally performed the services described in this documentation, which was scribed in my presence. The recorded information has been reviewed and is accurate.   Renne Crigler, PA-C 08/28/13 2017

## 2013-08-31 NOTE — ED Provider Notes (Signed)
Medical screening examination/treatment/procedure(s) were performed by non-physician practitioner and as supervising physician I was immediately available for consultation/collaboration.   EKG Interpretation None       Donnetta Hutching, MD 08/31/13 2008

## 2013-10-12 ENCOUNTER — Emergency Department (HOSPITAL_COMMUNITY)
Admission: EM | Admit: 2013-10-12 | Discharge: 2013-10-13 | Disposition: A | Discharge status: Home / Self Care | Payer: Medicaid Other | Attending: Emergency Medicine | Admitting: Emergency Medicine

## 2013-10-12 ENCOUNTER — Emergency Department (HOSPITAL_COMMUNITY): Payer: Medicaid Other

## 2013-10-12 ENCOUNTER — Encounter (HOSPITAL_COMMUNITY): Payer: Self-pay | Admitting: Emergency Medicine

## 2013-10-12 DIAGNOSIS — Z8639 Personal history of other endocrine, nutritional and metabolic disease: Secondary | ICD-10-CM | POA: Insufficient documentation

## 2013-10-12 DIAGNOSIS — H60399 Other infective otitis externa, unspecified ear: Secondary | ICD-10-CM | POA: Diagnosis not present

## 2013-10-12 DIAGNOSIS — H6091 Unspecified otitis externa, right ear: Secondary | ICD-10-CM

## 2013-10-12 DIAGNOSIS — R404 Transient alteration of awareness: Secondary | ICD-10-CM | POA: Diagnosis not present

## 2013-10-12 DIAGNOSIS — J45909 Unspecified asthma, uncomplicated: Secondary | ICD-10-CM | POA: Diagnosis not present

## 2013-10-12 DIAGNOSIS — F3289 Other specified depressive episodes: Secondary | ICD-10-CM | POA: Insufficient documentation

## 2013-10-12 DIAGNOSIS — Z862 Personal history of diseases of the blood and blood-forming organs and certain disorders involving the immune mechanism: Secondary | ICD-10-CM | POA: Insufficient documentation

## 2013-10-12 DIAGNOSIS — F329 Major depressive disorder, single episode, unspecified: Secondary | ICD-10-CM | POA: Insufficient documentation

## 2013-10-12 DIAGNOSIS — Z791 Long term (current) use of non-steroidal anti-inflammatories (NSAID): Secondary | ICD-10-CM | POA: Insufficient documentation

## 2013-10-12 DIAGNOSIS — Z8719 Personal history of other diseases of the digestive system: Secondary | ICD-10-CM | POA: Diagnosis not present

## 2013-10-12 DIAGNOSIS — R42 Dizziness and giddiness: Secondary | ICD-10-CM | POA: Insufficient documentation

## 2013-10-12 DIAGNOSIS — IMO0001 Reserved for inherently not codable concepts without codable children: Secondary | ICD-10-CM | POA: Diagnosis not present

## 2013-10-12 DIAGNOSIS — Z79899 Other long term (current) drug therapy: Secondary | ICD-10-CM | POA: Diagnosis not present

## 2013-10-12 HISTORY — DX: Syncope and collapse: R55

## 2013-10-12 LAB — CBC
HCT: 37.1 % (ref 36.0–46.0)
Hemoglobin: 12 g/dL (ref 12.0–15.0)
MCH: 28 pg (ref 26.0–34.0)
MCHC: 32.3 g/dL (ref 30.0–36.0)
MCV: 86.7 fL (ref 78.0–100.0)
Platelets: 239 10*3/uL (ref 150–400)
RBC: 4.28 MIL/uL (ref 3.87–5.11)
RDW: 13.3 % (ref 11.5–15.5)
WBC: 5.3 10*3/uL (ref 4.0–10.5)

## 2013-10-12 LAB — BASIC METABOLIC PANEL
BUN: 21 mg/dL (ref 6–23)
CO2: 24 mEq/L (ref 19–32)
Calcium: 8.9 mg/dL (ref 8.4–10.5)
Chloride: 103 mEq/L (ref 96–112)
Creatinine, Ser: 0.76 mg/dL (ref 0.50–1.10)
GFR calc Af Amer: >90 mL/min (ref >90)
GFR calc non Af Amer: >90 mL/min (ref >90)
Glucose, Bld: 83 mg/dL (ref 70–99)
Potassium: 3.9 mEq/L (ref 3.7–5.3)
Sodium: 139 mEq/L (ref 137–147)

## 2013-10-12 LAB — CBG MONITORING, ED: Glucose-Capillary: 85 mg/dL (ref 70–99)

## 2013-10-12 LAB — I-STAT TROPONIN, ED: Troponin i, poc: 0 ng/mL (ref 0.00–0.08)

## 2013-10-12 MED ORDER — SODIUM CHLORIDE 0.9 % IV BOLUS (SEPSIS)
1000.0000 mL | Freq: Once | INTRAVENOUS | Status: AC
  Administered 2013-10-12: 1000 mL via INTRAVENOUS

## 2013-10-12 MED ORDER — OXYCODONE-ACETAMINOPHEN 5-325 MG PO TABS: 2.0000 | ORAL_TABLET | ORAL | Status: DC | PRN

## 2013-10-12 MED ORDER — OXYCODONE-ACETAMINOPHEN 5-325 MG PO TABS
2.0000 | ORAL_TABLET | Freq: Once | ORAL | Status: AC
  Administered 2013-10-12: 2 via ORAL
  Filled 2013-10-12: qty 2

## 2013-10-12 MED ORDER — NEOMYCIN-POLYMYXIN-HC 3.5-10000-1 OT SUSP: 4.0000 [drp] | Freq: Four times a day (QID) | OTIC | Status: DC

## 2013-10-12 MED ORDER — ONDANSETRON HCL 4 MG/2ML IJ SOLN
4.0000 mg | Freq: Once | INTRAMUSCULAR | Status: AC
  Administered 2013-10-12: 4 mg via INTRAVENOUS
  Filled 2013-10-12: qty 2

## 2013-10-12 MED ORDER — MECLIZINE HCL 25 MG PO TABS: 25.0000 mg | ORAL_TABLET | Freq: Four times a day (QID) | ORAL | Status: DC | PRN

## 2013-10-12 NOTE — ED Notes (Signed)
Present with a HX of syncope-reports she stood up yesterday out of bed and got dizzy and then fell on the floor, the same thing happened this am and then again at 3 pm she felt dizzy and off balance and fell agai. She reports she does not know if she is passing out, but "i have a history of passing out" I have right ear pain on the outer part of my ear and I have trouble sleeping. Dizziness is worse with position changes. dneis dizziness at this time. She reports the "off balance feeling happens when she leans to the left. She is alert, orientned and MAE x4. No facial droop or arm drift, no slurred speech. Bilateral grips equal.  She also c/o muscle weakness and Fibromyalgia pain.

## 2013-10-12 NOTE — ED Provider Notes (Signed)
CSN: [id number]     Arrival date & time 10/12/13  1713 History   First MD Initiated Contact with Patient 10/12/13 2140     Chief Complaint  Patient presents with  . Loss of Consciousness  . Dizziness     (Consider location/radiation/quality/duration/timing/severity/associated sxs/prior Treatment) Patient is a 48 y.o. female presenting with syncope and dizziness.  Loss of Consciousness Associated symptoms: dizziness   Associated symptoms: no chest pain, no fever, no headaches, no nausea, no shortness of breath and no vomiting   Dizziness Quality:  Imbalance Severity:  Mild Onset quality:  Gradual Duration:  4 days Timing:  Intermittent Progression:  Unchanged Chronicity:  New Context: standing up   Context comment:  Movement Relieved by:  Being still Worsened by:  Nothing tried Ineffective treatments:  None tried Associated symptoms: syncope   Associated symptoms: no chest pain, no diarrhea, no headaches, no nausea, no shortness of breath and no vomiting   Syncope:    Witnessed: no     Suspicion of head trauma:  No   Past Medical History  Diagnosis Date  . Asthma   . GERD (gastroesophageal reflux disease)   . Grave's disease   . Fibromyalgia   . Environmental allergies   . Depression   . Sciatica   . Syncope and collapse   . Vasovagal syncope    Past Surgical History  Procedure Laterality Date  . Abdominal hysterectomy      partial  . Gastric bypass  2008   Family History  Problem Relation Age of Onset  . Hypertension Mother   . Cirrhosis Mother   . Hypertension Sister   . Diabetes Other   . Hypertension Other   . Diabetes Maternal Uncle   . Hypertension Father    History  Substance Use Topics  . Smoking status: Never Smoker   . Smokeless tobacco: Not on file  . Alcohol Use: No   OB History   Grav Para Term Preterm Abortions TAB SAB Ect Mult Living   2 2        2      Review of Systems  Constitutional: Negative for fever and fatigue.  HENT:  Negative for congestion and drooling.        Otalgia   Eyes: Negative for pain.  Respiratory: Negative for cough and shortness of breath.   Cardiovascular: Positive for syncope. Negative for chest pain.  Gastrointestinal: Negative for nausea, vomiting, abdominal pain and diarrhea.  Genitourinary: Negative for dysuria and hematuria.  Musculoskeletal: Negative for back pain, gait problem and neck pain.  Skin: Negative for color change.  Neurological: Positive for dizziness. Negative for headaches.  Hematological: Negative for adenopathy.  Psychiatric/Behavioral: Negative for behavioral problems.  All other systems reviewed and are negative.     Allergies  Mushroom ext cmplx(shiitake-reishi-mait); Nutritional supplements; Shellfish allergy; and Hydrocodone-acetaminophen  Home Medications   Prior to Admission medications   Medication Sig Start Date End Date Taking? Authorizing Provider  albuterol (PROVENTIL HFA;VENTOLIN HFA) 108 (90 BASE) MCG/ACT inhaler Inhale 2 puffs into the lungs every 6 (six) hours as needed for wheezing.   Yes Historical Provider, MD  azelastine (ASTELIN) 137 MCG/SPRAY nasal spray Place 1 spray into the nose 2 (two) times daily. Use in each nostril as directed   Yes Historical Provider, MD  BIOTIN 5000 PO Take 1 tablet by mouth daily.   Yes Historical Provider, MD  cholecalciferol (VITAMIN D) 1000 UNITS tablet Take 2,000 Units by mouth daily.   Yes Historical Provider,  MD  diazepam (VALIUM) 5 MG tablet Take 1 tablet at bed time for muscle spasm. 08/28/13  Yes Renne Crigler, PA-C  diphenhydrAMINE (BENADRYL) 25 mg capsule Take 25 mg by mouth every 6 (six) hours as needed.   Yes Historical Provider, MD  Epinastine HCl (ELESTAT) 0.05 % ophthalmic solution Place 1 drop into both eyes 2 (two) times daily.   Yes Historical Provider, MD  estradiol (ESTRACE) 0.5 MG tablet Take 0.5 mg by mouth daily.   Yes Historical Provider, MD  meloxicam (MOBIC) 7.5 MG tablet Take 7.5 mg by  mouth 2 (two) times daily.    Yes Historical Provider, MD  mometasone-formoterol (DULERA) 100-5 MCG/ACT AERO Inhale 2 puffs into the lungs 2 (two) times daily as needed for wheezing or shortness of breath.    Yes Historical Provider, MD  Multiple Vitamins-Minerals (MULTIVITAMIN WITH MINERALS) tablet Take 1 tablet by mouth daily.   Yes Historical Provider, MD  topiramate (TOPAMAX) 25 MG tablet Take 100 mg by mouth 2 (two) times daily.   Yes Historical Provider, MD  traZODone (DESYREL) 100 MG tablet Take 100 mg by mouth at bedtime.   Yes Historical Provider, MD  zolpidem (AMBIEN) 5 MG tablet Take 5 mg by mouth at bedtime as needed for sleep.   Yes Historical Provider, MD  EPINEPHrine (EPIPEN) 0.3 mg/0.3 mL DEVI Inject 0.3 mg into the muscle as needed. For allergic reaction to stinging insects, mushrooms, and shellfish     Historical Provider, MD   BP 103/61  Pulse 80  Temp(Src) 98.5 F (36.9 C) (Oral)  Resp 16  Ht 5\' 5"  (1.651 m)  Wt 170 lb (77.111 kg)  BMI 28.29 kg/m2  SpO2 98% Physical Exam  Nursing note and vitals reviewed. Constitutional: She is oriented to person, place, and time. She appears well-developed and well-nourished.  HENT:  Head: Normocephalic and atraumatic.  Mouth/Throat: Oropharynx is clear and moist. No oropharyngeal exudate.  Mild swelling diffusely of the right external ear with mild erythema. The pinna is tender to manipulation.  No tenderness to palpation of the mastoid processes bilaterally.  Normal appearing tympanic membranes bilaterally.  Eyes: Conjunctivae and EOM are normal. Pupils are equal, round, and reactive to light.  Neck: Normal range of motion. Neck supple.  No vertebral tenderness to palpation noted.  Cardiovascular: Normal rate, regular rhythm, normal heart sounds and intact distal pulses.  Exam reveals no gallop and no friction rub.   No murmur heard. Pulmonary/Chest: Effort normal and breath sounds normal. No respiratory distress. She has no  wheezes.  Abdominal: Soft. Bowel sounds are normal. There is no tenderness. There is no rebound and no guarding.  Musculoskeletal: Normal range of motion. She exhibits no edema and no tenderness.  Normal range of motion of the hips bilaterally.  Neurological: She is alert and oriented to person, place, and time.  alert, oriented x3 speech: normal in context and clarity memory: intact grossly cranial nerves II-XII: intact motor strength: full proximally and distally no involuntary movements or tremors sensation: intact to light touch diffusely  cerebellar: finger-to-nose and heel-to-shin intact gait: normal forwards and backwards   Skin: Skin is warm and dry.  Psychiatric: She has a normal mood and affect. Her behavior is normal.    ED Course  Procedures (including critical care time) Labs Review Labs Reviewed  CBC  BASIC METABOLIC PANEL  CBG MONITORING, ED  , ED    Imaging Review Dg Chest 2 View  10/12/2013   CLINICAL DATA:  Dizziness and  syncope.  Concern for chest injury.  EXAM: CHEST  2 VIEW  COMPARISON:  Chest radiograph performed 05/14/2011  FINDINGS: The lungs are well-aerated and clear. There is no evidence of focal opacification, pleural effusion or pneumothorax.  The heart is borderline normal in size; the mediastinal contour is within normal limits. No acute osseous abnormalities are seen. Postoperative change is noted at the left upper quadrant.  IMPRESSION: No acute cardiopulmonary process seen. No displaced rib fractures identified.   Electronically Signed   By: Roanna Raider M.D.   On: 10/12/2013 23:37   Ct Head Wo Contrast  10/12/2013   CLINICAL DATA:  Syncope and dizziness.  Weakness.  EXAM: CT HEAD WITHOUT CONTRAST  TECHNIQUE: Contiguous axial images were obtained from the base of the skull through the vertex without intravenous contrast.  COMPARISON:  CT of the head performed 11/01/2012  FINDINGS: There is no evidence of acute infarction, mass  lesion, or intra- or extra-axial hemorrhage on CT.  The posterior fossa, including the cerebellum, brainstem and fourth ventricle, is within normal limits. The third and lateral ventricles, and basal ganglia are unremarkable in appearance. The cerebral hemispheres are symmetric in appearance, with normal gray-white differentiation. No mass effect or midline shift is seen.  There is no evidence of fracture; visualized osseous structures are unremarkable in appearance. The visualized portions of the orbits are within normal limits. The paranasal sinuses and mastoid air cells are well-aerated. No significant soft tissue abnormalities are seen.  IMPRESSION: Unremarkable noncontrast CT of the head.   Electronically Signed   By: Roanna Raider M.D.   On: 10/12/2013 23:46     EKG Interpretation   Date/Time:  Friday Oct 12 2013 17:30:25 EDT Ventricular Rate:  77 PR Interval:  166 QRS Duration: 70 QT Interval:  344 QTC Calculation: 389 R Axis:   33 Text Interpretation:  Normal sinus rhythm Nonspecific T wave abnormality  No significant change since last tracing Confirmed by Oluwatamilore Starnes  MD,  Malarie Tappen ([id number]) on 10/12/2013 10:40:49 PM      MDM   Final diagnoses:  Dizziness  Otitis externa of right ear    10:41 PM 48 y.o. female with a history of vasovagal syncope who presents with right ear pain and dizziness for the last 4 days. She notes she developed left ear pain and then right ear pain 5 days ago. Soon after she began feeling intermittent dizziness which was worse with ambulation and movement. She denies any fevers, vomiting, diarrhea, HA or chest pain. She states that she had a near syncopal episode yesterday and another today. She also had an episode of syncope today. She denies hitting her head. She states that she has a long history of syncope and typically syncopized for several times per month. All these episodes occurred after the patient stood up. She is afebrile and vital signs are  unremarkable here. She has a normal neurologic exam. Will get screening labs and imaging.  11:55 PM: Pt feeling much better.  I believe her dizziness may be related to her current ear issue as it started very soon after her ear pain started. I interpreted/reviewed the labs and/or imaging which were non-contributory.  I have discussed the diagnosis/risks/treatment options with the patient and believe the pt to be eligible for discharge home to follow-up with her pcp next week. We also discussed returning to the ED immediately if new or worsening sx occur. We discussed the sx which are most concerning (e.g., worsening dizziness, fever, worsening pain) that necessitate  immediate return. Medications administered to the patient during their visit and any new prescriptions provided to the patient are listed below.  Medications given during this visit Medications  sodium chloride 0.9 % bolus 1,000 mL (1,000 mLs Intravenous New Bag/Given 10/12/13 2230)  oxyCODONE-acetaminophen (PERCOCET/ROXICET) 5-325 MG per tablet 2 tablet (2 tablets Oral Given 10/12/13 2205)  ondansetron [facility]) injection 4 mg (4 mg Intravenous Given 10/12/13 2312)    Discharge Medication List as of 10/12/2013 11:59 PM    START taking these medications   Details  meclizine (ANTIVERT) 25 MG tablet Take 1 tablet (25 mg total) by mouth every 6 (six) hours as needed for dizziness., Starting 10/12/2013, Until Discontinued, Print    neomycin-polymyxin-hydrocortisone (CORTISPORIN) 3.5-10000-1 otic suspension Place 4 drops into the right ear 4 (four) times daily. X 7 days, Starting 10/12/2013, Until Discontinued, Print    oxyCODONE-acetaminophen (PERCOCET) 5-325 MG per tablet Take 2 tablets by mouth every 4 (four) hours as needed., Starting 10/12/2013, Until Discontinued, Print         Junius Argyle, MD 10/13/13 0040

## 2014-02-23 ENCOUNTER — Emergency Department (HOSPITAL_COMMUNITY)
Admission: EM | Admit: 2014-02-23 | Discharge: 2014-02-23 | Disposition: A | Discharge status: Home / Self Care | Payer: Medicare Other | Attending: Emergency Medicine | Admitting: Emergency Medicine

## 2014-02-23 ENCOUNTER — Encounter (HOSPITAL_COMMUNITY): Payer: Self-pay | Admitting: Emergency Medicine

## 2014-02-23 DIAGNOSIS — K219 Gastro-esophageal reflux disease without esophagitis: Secondary | ICD-10-CM | POA: Insufficient documentation

## 2014-02-23 DIAGNOSIS — Z791 Long term (current) use of non-steroidal anti-inflammatories (NSAID): Secondary | ICD-10-CM | POA: Insufficient documentation

## 2014-02-23 DIAGNOSIS — R509 Fever, unspecified: Secondary | ICD-10-CM | POA: Insufficient documentation

## 2014-02-23 DIAGNOSIS — M797 Fibromyalgia: Secondary | ICD-10-CM | POA: Insufficient documentation

## 2014-02-23 DIAGNOSIS — R197 Diarrhea, unspecified: Secondary | ICD-10-CM | POA: Diagnosis not present

## 2014-02-23 DIAGNOSIS — K5900 Constipation, unspecified: Secondary | ICD-10-CM | POA: Insufficient documentation

## 2014-02-23 DIAGNOSIS — R1110 Vomiting, unspecified: Secondary | ICD-10-CM | POA: Diagnosis not present

## 2014-02-23 DIAGNOSIS — Z8639 Personal history of other endocrine, nutritional and metabolic disease: Secondary | ICD-10-CM | POA: Diagnosis not present

## 2014-02-23 DIAGNOSIS — R51 Headache: Secondary | ICD-10-CM | POA: Insufficient documentation

## 2014-02-23 DIAGNOSIS — Z7952 Long term (current) use of systemic steroids: Secondary | ICD-10-CM | POA: Diagnosis not present

## 2014-02-23 DIAGNOSIS — M5430 Sciatica, unspecified side: Secondary | ICD-10-CM | POA: Insufficient documentation

## 2014-02-23 DIAGNOSIS — M545 Low back pain, unspecified: Secondary | ICD-10-CM

## 2014-02-23 DIAGNOSIS — F329 Major depressive disorder, single episode, unspecified: Secondary | ICD-10-CM | POA: Insufficient documentation

## 2014-02-23 DIAGNOSIS — J45909 Unspecified asthma, uncomplicated: Secondary | ICD-10-CM | POA: Insufficient documentation

## 2014-02-23 DIAGNOSIS — M549 Dorsalgia, unspecified: Secondary | ICD-10-CM | POA: Diagnosis present

## 2014-02-23 DIAGNOSIS — Z79899 Other long term (current) drug therapy: Secondary | ICD-10-CM | POA: Diagnosis not present

## 2014-02-23 LAB — URINALYSIS, ROUTINE W REFLEX MICROSCOPIC
Bilirubin Urine: NEGATIVE
Glucose, UA: NEGATIVE mg/dL
Hgb urine dipstick: NEGATIVE
Ketones, ur: NEGATIVE mg/dL
Leukocytes, UA: NEGATIVE
Nitrite: NEGATIVE
Protein, ur: NEGATIVE mg/dL
Specific Gravity, Urine: 1.017 (ref 1.005–1.030)
Urobilinogen, UA: 0.2 mg/dL (ref 0.0–1.0)
pH: 5.5 (ref 5.0–8.0)

## 2014-02-23 MED ORDER — CYCLOBENZAPRINE HCL 10 MG PO TABS: 10.0000 mg | ORAL_TABLET | Freq: Two times a day (BID) | ORAL | Status: DC | PRN

## 2014-02-23 MED ORDER — OXYCODONE-ACETAMINOPHEN 5-325 MG PO TABS: 1.0000 | ORAL_TABLET | Freq: Four times a day (QID) | ORAL | Status: DC | PRN

## 2014-02-23 MED ORDER — DEXAMETHASONE SODIUM PHOSPHATE 10 MG/ML IJ SOLN
10.0000 mg | Freq: Once | INTRAMUSCULAR | Status: AC
  Administered 2014-02-23: 10 mg via INTRAMUSCULAR
  Filled 2014-02-23: qty 1

## 2014-02-23 NOTE — Discharge Instructions (Signed)
Please read and follow all provided instructions.  Your diagnoses today include:  1. Bilateral low back pain without sciatica     Tests performed today include:  Vital signs - see below for your results today  Urine test - no signs of infection  Medications prescribed:   Flexeril (cyclobenzaprine) - muscle relaxer medication  DO NOT drive or perform any activities that require you to be awake and alert because this medicine can make you drowsy. Do not take methocarbamol while taking this medication.    Percocet (oxycodone/acetaminophen) - narcotic pain medication  DO NOT drive or perform any activities that require you to be awake and alert because this medicine can make you drowsy. BE VERY CAREFUL not to take multiple medicines containing Tylenol (also called acetaminophen). Doing so can lead to an overdose which can damage your liver and cause liver failure and possibly death.  Take any prescribed medications only as directed.  Home care instructions:   Follow any educational materials contained in this packet  Please rest, use ice or heat on your back for the next several days  Do not lift, push, pull anything more than 10 pounds for the next week  Follow-up instructions: Please follow-up with your primary care provider in the next 1 week for further evaluation of your symptoms.   Return instructions:  SEEK IMMEDIATE MEDICAL ATTENTION IF YOU HAVE:  New numbness, tingling, weakness, or problem with the use of your arms or legs  Severe back pain not relieved with medications  Loss control of your bowels or bladder  Increasing pain in any areas of the body (such as chest or abdominal pain)  Shortness of breath, dizziness, or fainting.   Worsening nausea (feeling sick to your stomach), vomiting, fever, or sweats  Any other emergent concerns regarding your health   Additional Information:  Your vital signs today were: BP 110/69   Pulse 77   Temp(Src) 98.7 F  (37.1 C) (Oral)   Resp 15   SpO2 100% If your blood pressure (BP) was elevated above 135/85 this visit, please have this repeated by your doctor within one month. --------------

## 2014-02-23 NOTE — ED Notes (Signed)
Pt states that she has been having low back pain radiating down bilateral legs x 3 days.  Has been taking meloxicam and advil.

## 2014-02-23 NOTE — ED Provider Notes (Signed)
Medical screening examination/treatment/procedure(s) were performed by non-physician practitioner and as supervising physician I was immediately available for consultation/collaboration.   EKG Interpretation None        Leann Mayweather, MD 02/23/14 2305 

## 2014-02-23 NOTE — ED Provider Notes (Signed)
CSN: [id number]     Arrival date & time 02/23/14  1757 History  This chart was scribed for a non-physician practitioner, Rhea Bleacher, PA-C working with Rolan Bucco, MD by [place], ED Scribe. The patient was seen in [facility]. The patient's care was started at 7:12 PM.    Chief Complaint  Patient presents with  . Headache  . Back Pain    The history is provided by the patient. No language interpreter was used.   HPI Comments: [name] is a 48 y.o. female who presents to the Emergency Department complaining of lower back pain onset 3 days ago that radiates bilaterally down her legs. She has a history of chronic back pain and fibromyalgia. She states that this episode feels like there is more pressure to affected area than what she is use to. She also reports that this somewhat feels similar to fibromyalgia but this current episode is worse. Pt states that she is not able to fully lay down and has to use 4 or 5 pillows to support herself when sleeping. She states experiencing diarrhea and constipation earlier this week along with an increase in urinary urgency and frequency. She denies any nausea but does report vomiting and one occurrence of fever yesterday, now resolved. Pt denies any injections or treatment to back recently. Pt has been taking oxycodone to address pain but it causes itching so she has to take Benadryl with it.    Past Medical History  Diagnosis Date  . Asthma   . GERD (gastroesophageal reflux disease)   . Grave's disease   . Fibromyalgia   . Environmental allergies   . Depression   . Sciatica   . Syncope and collapse   . Vasovagal syncope    Past Surgical History  Procedure Laterality Date  . Abdominal hysterectomy      partial  . Gastric bypass  2008   Family History  Problem Relation Age of Onset  . Hypertension Mother   . Cirrhosis Mother   . Hypertension Sister   . Diabetes Other   . Hypertension Other   . Diabetes Maternal Uncle   .  Hypertension Father    History  Substance Use Topics  . Smoking status: Never Smoker   . Smokeless tobacco: Not on file  . Alcohol Use: No   OB History   Grav Para Term Preterm Abortions TAB SAB Ect Mult Living   2 2        2      Review of Systems  Constitutional: Positive for fever. Negative for chills and unexpected weight change.  Gastrointestinal: Positive for vomiting, diarrhea and constipation. Negative for nausea.       Negative for fecal incontinence.   Genitourinary: Positive for urgency and frequency. Negative for dysuria, hematuria, flank pain, vaginal bleeding, vaginal discharge and pelvic pain.       Negative for urinary incontinence or retention.  Musculoskeletal: Positive for back pain.  Neurological: Negative for weakness and numbness.       Denies saddle paresthesias.    Allergies  Mushroom ext cmplx(shiitake-reishi-mait); Nutritional supplements; Shellfish allergy; and Hydrocodone-acetaminophen  Home Medications   Prior to Admission medications   Medication Sig Start Date End Date Taking? Authorizing Provider  albuterol (PROVENTIL HFA;VENTOLIN HFA) 108 (90 BASE) MCG/ACT inhaler Inhale 2 puffs into the lungs every 6 (six) hours as needed for wheezing.    Historical Provider, MD  azelastine (ASTELIN) 137 MCG/SPRAY nasal spray Place 1 spray into the nose 2 (two)  times daily. Use in each nostril as directed    Historical Provider, MD  BIOTIN 5000 PO Take 1 tablet by mouth daily.    Historical Provider, MD  cholecalciferol (VITAMIN D) 1000 UNITS tablet Take 2,000 Units by mouth daily.    Historical Provider, MD  diazepam (VALIUM) 5 MG tablet Take 1 tablet at bed time for muscle spasm. 08/28/13   Renne Crigler, PA-C  diphenhydrAMINE (BENADRYL) 25 mg capsule Take 25 mg by mouth every 6 (six) hours as needed.    Historical Provider, MD  Epinastine HCl (ELESTAT) 0.05 % ophthalmic solution Place 1 drop into both eyes 2 (two) times daily.    Historical Provider, MD   EPINEPHrine (EPIPEN) 0.3 mg/0.3 mL DEVI Inject 0.3 mg into the muscle as needed. For allergic reaction to stinging insects, mushrooms, and shellfish     Historical Provider, MD  estradiol (ESTRACE) 0.5 MG tablet Take 0.5 mg by mouth daily.    Historical Provider, MD  meclizine (ANTIVERT) 25 MG tablet Take 1 tablet (25 mg total) by mouth every 6 (six) hours as needed for dizziness. 10/12/13   Purvis Sheffield, MD  meloxicam (MOBIC) 7.5 MG tablet Take 7.5 mg by mouth 2 (two) times daily.     Historical Provider, MD  mometasone-formoterol (DULERA) 100-5 MCG/ACT AERO Inhale 2 puffs into the lungs 2 (two) times daily as needed for wheezing or shortness of breath.     Historical Provider, MD  Multiple Vitamins-Minerals (MULTIVITAMIN WITH MINERALS) tablet Take 1 tablet by mouth daily.    Historical Provider, MD  neomycin-polymyxin-hydrocortisone (CORTISPORIN) 3.5-10000-1 otic suspension Place 4 drops into the right ear 4 (four) times daily. X 7 days 10/12/13   Purvis Sheffield, MD  oxyCODONE-acetaminophen (PERCOCET) 5-325 MG per tablet Take 2 tablets by mouth every 4 (four) hours as needed. 10/12/13   Purvis Sheffield, MD  topiramate (TOPAMAX) 25 MG tablet Take 100 mg by mouth 2 (two) times daily.    Historical Provider, MD  traZODone (DESYREL) 100 MG tablet Take 100 mg by mouth at bedtime.    Historical Provider, MD  zolpidem (AMBIEN) 5 MG tablet Take 5 mg by mouth at bedtime as needed for sleep.    Historical Provider, MD   BP 110/69  Pulse 77  Temp(Src) 98.7 F (37.1 C) (Oral)  Resp 15  SpO2 100% Physical Exam  Nursing note and vitals reviewed. Constitutional: She appears well-developed and well-nourished. No distress.  HENT:  Head: Normocephalic and atraumatic.  Eyes: Conjunctivae and EOM are normal.  Neck: Normal range of motion. Neck supple. No tracheal deviation present.  Cardiovascular: Normal rate.   Pulmonary/Chest: Effort normal. No respiratory distress.  Abdominal: Soft. There is no  tenderness. There is no CVA tenderness.  Musculoskeletal: Normal range of motion. She exhibits tenderness. She exhibits no edema.       Cervical back: She exhibits normal range of motion, no tenderness and no bony tenderness.       Thoracic back: She exhibits normal range of motion, no tenderness and no bony tenderness.       Lumbar back: She exhibits tenderness. She exhibits normal range of motion and no bony tenderness.       Back:  No step-off noted with palpation of spine.   Neurological: She is alert. She has normal strength and normal reflexes. No sensory deficit. Coordination and gait normal.  5/5 strength in entire lower extremities bilaterally. No sensation deficit.   Skin: Skin is warm and dry. No rash noted.  Psychiatric:  She has a normal mood and affect. Her behavior is normal.    ED Course  Procedures (including critical care time) Labs Review Labs Reviewed  URINALYSIS, ROUTINE W REFLEX MICROSCOPIC    No results found.    Imaging Review No results found.   EKG Interpretation None     Medications  dexamethasone (DECADRON) injection 10 mg (10 mg Intramuscular Given 02/23/14 2022)    7:16 PM- Treatment plan was discussed with patient who verbalizes understanding and agrees.   Patient seen and examined. Work-up initiated. Medications ordered.   Vital signs reviewed and are as follows: Filed Vitals:   02/23/14 1839  BP: 110/69  Pulse: 77  Temp: 98.7 F (37.1 C)  Resp: 15    8:31 PM Pt informed of UA results. Pt has gotten some relief with 'steroid shot' in past and states that Flexeril works better than her current Robaxin.   She will take Flexeril in place of Robaxin for next several days. Will give IM decadron prior to discharge.  No red flag s/s of low back pain. Patient was counseled on back pain precautions and told to do activity as tolerated but do not lift, push, or pull heavy objects more than 10 pounds for the next week.  Patient counseled to use  ice or heat on back for no longer than 15 minutes every hour.   Patient prescribed muscle relaxer and counseled on proper use of muscle relaxant medication.    Patient prescribed narcotic pain medicine and counseled on proper use of narcotic pain medications. Counseled not to combine this medication with others containing tylenol.   Urged patient not to drink alcohol, drive, or perform any other activities that requires focus while taking either of these medications.  Patient urged to follow-up with PCP if pain does not improve with treatment and rest or if pain becomes recurrent. Urged to return with worsening severe pain, loss of bowel or bladder control, trouble walking.   The patient verbalizes understanding and agrees with the plan.   MDM   Final diagnoses:  Bilateral low back pain without sciatica   Patient with back pain -- tough exam due to history of fibromyalgia, chronic pain. This seems to be flare of her chronic pain. No neurological deficits on exam. Patient is ambulatory. No warning symptoms of back pain including: fecal incontinence, urinary retention or overflow incontinence, waking from sleep with back pain, unexplained fevers or weight loss, h/o cancer, IVDU, recent trauma. She had one episode of fever, resolved currently, but no risk factors for spread from other site (no skin infection, no reported IVDU, no UTI). No concern for cauda equina, epidural abscess, or other serious cause of back pain. Conservative measures such as rest, ice/heat and pain medicine indicated with PCP follow-up if no improvement with conservative management. Do not feel advanced imaging indicated at this point.   I personally performed the services described in this documentation, which was scribed in my presence. The recorded information has been reviewed and is accurate.   Renne Crigler, PA-C 02/23/14 2037

## 2014-03-25 ENCOUNTER — Encounter (HOSPITAL_COMMUNITY): Payer: Self-pay | Admitting: Emergency Medicine

## 2014-05-06 ENCOUNTER — Ambulatory Visit (INDEPENDENT_AMBULATORY_CARE_PROVIDER_SITE_OTHER): Payer: Medicare Other | Admitting: Family Medicine

## 2014-05-06 VITALS — BP 102/68 | HR 78 | Temp 98.1°F | Resp 16 | Ht 66.0 in | Wt 179.0 lb

## 2014-05-06 DIAGNOSIS — R51 Headache: Secondary | ICD-10-CM

## 2014-05-06 DIAGNOSIS — J069 Acute upper respiratory infection, unspecified: Secondary | ICD-10-CM

## 2014-05-06 DIAGNOSIS — R519 Headache, unspecified: Secondary | ICD-10-CM

## 2014-05-06 DIAGNOSIS — J0100 Acute maxillary sinusitis, unspecified: Secondary | ICD-10-CM

## 2014-05-06 DIAGNOSIS — M5432 Sciatica, left side: Secondary | ICD-10-CM

## 2014-05-06 DIAGNOSIS — M797 Fibromyalgia: Secondary | ICD-10-CM

## 2014-05-06 DIAGNOSIS — R55 Syncope and collapse: Secondary | ICD-10-CM

## 2014-05-06 DIAGNOSIS — R42 Dizziness and giddiness: Secondary | ICD-10-CM

## 2014-05-06 LAB — POCT CBC
Granulocyte percent: 54.5 %G (ref 37–80)
HCT, POC: 41.6 % (ref 37.7–47.9)
Hemoglobin: 13.2 g/dL (ref 12.2–16.2)
Lymph, poc: 3 (ref 0.6–3.4)
MCH, POC: 27.7 pg (ref 27–31.2)
MCHC: 31.7 g/dL — AB (ref 31.8–35.4)
MCV: 87.6 fL (ref 80–97)
MID (cbc): 0.8 (ref 0–0.9)
MPV: 8.6 fL (ref 0–99.8)
POC Granulocyte: 4.5 (ref 2–6.9)
POC LYMPH PERCENT: 35.9 %L (ref 10–50)
POC MID %: 9.6 %M (ref 0–12)
Platelet Count, POC: 262 10*3/uL (ref 142–424)
RBC: 4.74 M/uL (ref 4.04–5.48)
RDW, POC: 13.6 %
WBC: 8.3 10*3/uL (ref 4.6–10.2)

## 2014-05-06 LAB — POCT UA - MICROSCOPIC ONLY
Casts, Ur, LPF, POC: NEGATIVE
Crystals, Ur, HPF, POC: NEGATIVE
Mucus, UA: NEGATIVE
Yeast, UA: NEGATIVE

## 2014-05-06 LAB — POCT URINALYSIS DIPSTICK
Bilirubin, UA: NEGATIVE
Blood, UA: NEGATIVE
Glucose, UA: NEGATIVE
Ketones, UA: NEGATIVE
Leukocytes, UA: NEGATIVE
Nitrite, UA: NEGATIVE
Protein, UA: NEGATIVE
Spec Grav, UA: >=1.03
Urobilinogen, UA: 0.2
pH, UA: 5.5

## 2014-05-06 MED ORDER — PREDNISONE 20 MG PO TABS: ORAL_TABLET | ORAL | Status: DC

## 2014-05-06 MED ORDER — OXYCODONE-ACETAMINOPHEN 10-325 MG PO TABS: 1.0000 | ORAL_TABLET | Freq: Three times a day (TID) | ORAL | Status: DC | PRN

## 2014-05-06 MED ORDER — OXYCODONE-ACETAMINOPHEN 5-325 MG PO TABS: 1.0000 | ORAL_TABLET | Freq: Three times a day (TID) | ORAL | Status: DC | PRN

## 2014-05-06 MED ORDER — AMOXICILLIN-POT CLAVULANATE 875-125 MG PO TABS: 1.0000 | ORAL_TABLET | Freq: Two times a day (BID) | ORAL | Status: DC

## 2014-05-06 NOTE — Patient Instructions (Signed)
Sciatica Sciatica is pain, weakness, numbness, or tingling along the path of the sciatic nerve. The nerve starts in the lower back and runs down the back of each leg. The nerve controls the muscles in the lower leg and in the back of the knee, while also providing sensation to the back of the thigh, lower leg, and the sole of your foot. Sciatica is a symptom of another medical condition. For instance, nerve damage or certain conditions, such as a herniated disk or bone spur on the spine, pinch or put pressure on the sciatic nerve. This causes the pain, weakness, or other sensations normally associated with sciatica. Generally, sciatica only affects one side of the body. CAUSES   Herniated or slipped disc.  Degenerative disk disease.  A pain disorder involving the narrow muscle in the buttocks (piriformis syndrome).  Pelvic injury or fracture.  Pregnancy.  Tumor (rare). SYMPTOMS  Symptoms can vary from mild to very severe. The symptoms usually travel from the low back to the buttocks and down the back of the leg. Symptoms can include:  Mild tingling or dull aches in the lower back, leg, or hip.  Numbness in the back of the calf or sole of the foot.  Burning sensations in the lower back, leg, or hip.  Sharp pains in the lower back, leg, or hip.  Leg weakness.  Severe back pain inhibiting movement. These symptoms may get worse with coughing, sneezing, laughing, or prolonged sitting or standing. Also, being overweight may worsen symptoms. DIAGNOSIS  Your caregiver will perform a physical exam to look for common symptoms of sciatica. He or she may ask you to do certain movements or activities that would trigger sciatic nerve pain. Other tests may be performed to find the cause of the sciatica. These may include:  Blood tests.  X-rays.  Imaging tests, such as an MRI or CT scan. TREATMENT  Treatment is directed at the cause of the sciatic pain. Sometimes, treatment is not necessary  and the pain and discomfort goes away on its own. If treatment is needed, your caregiver may suggest:  Over-the-counter medicines to relieve pain.  Prescription medicines, such as anti-inflammatory medicine, muscle relaxants, or narcotics.  Applying heat or ice to the painful area.  Steroid injections to lessen pain, irritation, and inflammation around the nerve.  Reducing activity during periods of pain.  Exercising and stretching to strengthen your abdomen and improve flexibility of your spine. Your caregiver may suggest losing weight if the extra weight makes the back pain worse.  Physical therapy.  Surgery to eliminate what is pressing or pinching the nerve, such as a bone spur or part of a herniated disk. HOME CARE INSTRUCTIONS   Only take over-the-counter or prescription medicines for pain or discomfort as directed by your caregiver.  Apply ice to the affected area for 20 minutes, 3-4 times a day for the first 48-72 hours. Then try heat in the same way.  Exercise, stretch, or perform your usual activities if these do not aggravate your pain.  Attend physical therapy sessions as directed by your caregiver.  Keep all follow-up appointments as directed by your caregiver.  Do not wear high heels or shoes that do not provide proper support.  Check your mattress to see if it is too soft. A firm mattress may lessen your pain and discomfort. SEEK IMMEDIATE MEDICAL CARE IF:   You lose control of your bowel or bladder (incontinence).  You have increasing weakness in the lower back, pelvis, buttocks,   or legs.  You have redness or swelling of your back.  You have a burning sensation when you urinate.  You have pain that gets worse when you lie down or awakens you at night.  Your pain is worse than you have experienced in the past.  Your pain is lasting longer than 4 weeks.  You are suddenly losing weight without reason. MAKE SURE YOU:  Understand these  instructions.  Will watch your condition.  Will get help right away if you are not doing well or get worse. Document Released: 05/04/2001 Document Revised: 11/09/2011 Document Reviewed: 09/19/2011 [employer] Patient Information 2015 [employer]. This information is not intended to replace advice given to you by your health care provider. Make sure you discuss any questions you have with your health care provider.  
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
## 2014-05-07 LAB — COMPREHENSIVE METABOLIC PANEL
ALT: 13 U/L (ref 0–35)
AST: 17 U/L (ref 0–37)
Albumin: 4.2 g/dL (ref 3.5–5.2)
Alkaline Phosphatase: 70 U/L (ref 39–117)
BUN: 22 mg/dL (ref 6–23)
CO2: 24 mEq/L (ref 19–32)
Calcium: 9.1 mg/dL (ref 8.4–10.5)
Chloride: 108 mEq/L (ref 96–112)
Creat: 0.89 mg/dL (ref 0.50–1.10)
Glucose, Bld: 65 mg/dL — ABNORMAL LOW (ref 70–99)
Potassium: 4.2 mEq/L (ref 3.5–5.3)
Sodium: 140 mEq/L (ref 135–145)
Total Bilirubin: 0.3 mg/dL (ref 0.2–1.2)
Total Protein: 7.2 g/dL (ref 6.0–8.3)

## 2014-05-30 ENCOUNTER — Ambulatory Visit (INDEPENDENT_AMBULATORY_CARE_PROVIDER_SITE_OTHER): Payer: Medicare Other | Admitting: Family Medicine

## 2014-05-30 ENCOUNTER — Ambulatory Visit (INDEPENDENT_AMBULATORY_CARE_PROVIDER_SITE_OTHER): Payer: Medicare Other

## 2014-05-30 VITALS — BP 110/68 | HR 84 | Temp 98.0°F | Resp 18 | Ht 65.0 in | Wt 184.0 lb

## 2014-05-30 DIAGNOSIS — R55 Syncope and collapse: Secondary | ICD-10-CM

## 2014-05-30 DIAGNOSIS — R197 Diarrhea, unspecified: Secondary | ICD-10-CM

## 2014-05-30 DIAGNOSIS — J22 Unspecified acute lower respiratory infection: Secondary | ICD-10-CM

## 2014-05-30 DIAGNOSIS — R059 Cough, unspecified: Secondary | ICD-10-CM

## 2014-05-30 DIAGNOSIS — R05 Cough: Secondary | ICD-10-CM

## 2014-05-30 MED ORDER — IPRATROPIUM BROMIDE 0.02 % IN SOLN
0.5000 mg | Freq: Once | RESPIRATORY_TRACT | Status: AC
  Administered 2014-05-30: 0.5 mg via RESPIRATORY_TRACT

## 2014-05-30 MED ORDER — GUAIFENESIN-CODEINE 200-20 MG/5ML PO LIQD: ORAL | Status: DC

## 2014-05-30 MED ORDER — BENZONATATE 200 MG PO CAPS: 200.0000 mg | ORAL_CAPSULE | Freq: Three times a day (TID) | ORAL | Status: DC | PRN

## 2014-05-30 MED ORDER — ALBUTEROL SULFATE (2.5 MG/3ML) 0.083% IN NEBU
2.5000 mg | INHALATION_SOLUTION | Freq: Once | RESPIRATORY_TRACT | Status: AC
  Administered 2014-05-30: 2.5 mg via RESPIRATORY_TRACT
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
## 2014-05-30 NOTE — Patient Instructions (Signed)
Acute Bronchospasm  Acute bronchospasm caused by asthma is also referred to as an asthma attack. Bronchospasm means your air passages become narrowed. The narrowing is caused by inflammation and tightening of the muscles in the air tubes (bronchi) in your lungs. This can make it hard to breathe or cause you to wheeze and cough.  CAUSES  Possible triggers are:  · Animal dander from the skin, hair, or feathers of animals.  · Dust mites contained in house dust.  · Cockroaches.  · Pollen from trees or grass.  · Mold.  · Cigarette or tobacco smoke.  · Air pollutants such as dust, household cleaners, hair sprays, aerosol sprays, paint fumes, strong chemicals, or strong odors.  · Cold air or weather changes. Cold air may trigger inflammation. Winds increase molds and pollens in the air.  · Strong emotions such as crying or laughing hard.  · Stress.  · Certain medicines such as aspirin or beta-blockers.  · Sulfites in foods and drinks, such as dried fruits and wine.  · Infections or inflammatory conditions, such as a flu, cold, or inflammation of the nasal membranes (rhinitis).  · Gastroesophageal reflux disease (GERD). GERD is a condition where stomach acid backs up into your esophagus.  · Exercise or strenuous activity.  SIGNS AND SYMPTOMS   · Wheezing.  · Excessive coughing, particularly at night.  · Chest tightness.  · Shortness of breath.  DIAGNOSIS   Your health care provider will ask you about your medical history and perform a physical exam. A chest X-ray or blood testing may be performed to look for other causes of your symptoms or other conditions that may have triggered your asthma attack.   TREATMENT   Treatment is aimed at reducing inflammation and opening up the airways in your lungs.  Most asthma attacks are treated with inhaled medicines. These include quick relief or rescue medicines (such as bronchodilators) and controller medicines (such as inhaled corticosteroids). These medicines are sometimes given  through an inhaler or a nebulizer. Systemic steroid medicine taken by mouth or given through an IV tube also can be used to reduce the inflammation when an attack is moderate or severe. Antibiotic medicines are only used if a bacterial infection is present.   HOME CARE INSTRUCTIONS   · Rest.  · Drink plenty of liquids. This helps the mucus to remain thin and be easily coughed up. Only use caffeine in moderation and do not use alcohol until you have recovered from your illness.  · Do not smoke. Avoid being exposed to secondhand smoke.  · You play a critical role in keeping yourself in good health. Avoid exposure to things that cause you to wheeze or to have breathing problems.  · Keep your medicines up-to-date and available. Carefully follow your health care provider's treatment plan.  · Take your medicine exactly as prescribed.  · When pollen or pollution is bad, keep windows closed and use an air conditioner or go to places with air conditioning.  · Asthma requires careful medical care. See your health care provider for a follow-up as advised. If you are more than [redacted] weeks pregnant and you were prescribed any new medicines, let your obstetrician know about the visit and how you are doing. Follow up with your health care provider as directed.  · After you have recovered from your asthma attack, make an appointment with your outpatient doctor to talk about ways to reduce the likelihood of future attacks. If you do not have a doctor who   manages your asthma, make an appointment with a primary care doctor to discuss your asthma.  SEEK IMMEDIATE MEDICAL CARE IF:   · You are getting worse.  · You have trouble breathing. If severe, call your local emergency services (911 in the [place]  · You develop chest pain or discomfort.  · You are vomiting.  · You are not able to keep fluids down.  · You are coughing up yellow, green, brown, or bloody sputum.  · You have a fever and your symptoms suddenly get worse.  · You have  trouble swallowing.  MAKE SURE YOU:   · Understand these instructions.  · Will watch your condition.  · Will get help right away if you are not doing well or get worse.  Document Released: 08/25/2006 Document Revised: 05/15/2013 Document Reviewed: 11/15/2012  Exit[employer]® Patient Information ©2015 [employer]. This information is not intended to replace advice given to you by your health care provider. Make sure you discuss any questions you have with your health care provider.

## 2014-05-31 ENCOUNTER — Telehealth: Payer: Self-pay | Admitting: Family Medicine

## 2014-05-31 MED ORDER — LEVOFLOXACIN 500 MG PO TABS: 500.0000 mg | ORAL_TABLET | Freq: Every day | ORAL | Status: DC

## 2014-05-31 NOTE — Telephone Encounter (Signed)
LM on phone and also mychart

## 2014-06-12 ENCOUNTER — Ambulatory Visit (INDEPENDENT_AMBULATORY_CARE_PROVIDER_SITE_OTHER): Payer: Medicare Other | Admitting: Family Medicine

## 2014-06-12 ENCOUNTER — Ambulatory Visit (INDEPENDENT_AMBULATORY_CARE_PROVIDER_SITE_OTHER): Payer: Medicare Other

## 2014-06-12 VITALS — BP 110/62 | HR 90 | Temp 97.8°F | Resp 18 | Ht 65.0 in | Wt 182.6 lb

## 2014-06-12 DIAGNOSIS — R55 Syncope and collapse: Secondary | ICD-10-CM | POA: Diagnosis not present

## 2014-06-12 DIAGNOSIS — R1110 Vomiting, unspecified: Secondary | ICD-10-CM

## 2014-06-12 DIAGNOSIS — R059 Cough, unspecified: Secondary | ICD-10-CM

## 2014-06-12 DIAGNOSIS — M5432 Sciatica, left side: Secondary | ICD-10-CM | POA: Diagnosis not present

## 2014-06-12 DIAGNOSIS — R197 Diarrhea, unspecified: Secondary | ICD-10-CM

## 2014-06-12 DIAGNOSIS — R05 Cough: Secondary | ICD-10-CM

## 2014-06-12 DIAGNOSIS — E860 Dehydration: Secondary | ICD-10-CM

## 2014-06-12 LAB — POCT CBC
Granulocyte percent: 57.7 %G (ref 37–80)
HCT, POC: 41.9 % (ref 37.7–47.9)
Hemoglobin: 13.3 g/dL (ref 12.2–16.2)
Lymph, poc: 1.8 (ref 0.6–3.4)
MCH, POC: 28.3 pg (ref 27–31.2)
MCHC: 31.8 g/dL (ref 31.8–35.4)
MCV: 89.1 fL (ref 80–97)
MID (cbc): 0.5 (ref 0–0.9)
MPV: 8.5 fL (ref 0–99.8)
POC Granulocyte: 3.2 (ref 2–6.9)
POC LYMPH PERCENT: 33 %L (ref 10–50)
POC MID %: 9.3 %M (ref 0–12)
Platelet Count, POC: 249 10*3/uL (ref 142–424)
RBC: 4.7 M/uL (ref 4.04–5.48)
RDW, POC: 14.3 %
WBC: 5.5 10*3/uL (ref 4.6–10.2)

## 2014-06-12 LAB — GLUCOSE, POCT (MANUAL RESULT ENTRY): POC Glucose: 159 mg/dl — AB (ref 70–99)

## 2014-06-12 MED ORDER — ALBUTEROL SULFATE (2.5 MG/3ML) 0.083% IN NEBU
2.5000 mg | INHALATION_SOLUTION | Freq: Once | RESPIRATORY_TRACT | Status: AC
  Administered 2014-06-12: 2.5 mg via RESPIRATORY_TRACT

## 2014-06-12 MED ORDER — OXYCODONE-ACETAMINOPHEN 5-325 MG PO TABS: ORAL_TABLET | ORAL | Status: DC

## 2014-06-12 MED ORDER — ONDANSETRON 4 MG PO TBDP
ORAL_TABLET | ORAL | Status: DC
Start: 2014-06-12 — End: 2014-06-13

## 2014-06-12 MED ORDER — BENZONATATE 200 MG PO CAPS: 200.0000 mg | ORAL_CAPSULE | Freq: Three times a day (TID) | ORAL | Status: DC | PRN

## 2014-06-12 MED ORDER — METHYLPREDNISOLONE ACETATE 80 MG/ML IJ SUSP
120.0000 mg | Freq: Once | INTRAMUSCULAR | Status: AC
  Administered 2014-06-12: 120 mg via INTRAMUSCULAR

## 2014-06-12 NOTE — Patient Instructions (Addendum)
Since you have tolerated the oxycodone in the past, you can take 1 every 6 hours only if needed for severe cough.  Take the ondansetron one every 6 hours if needed for nausea vomiting  Drink plenty of fluids so that you are urinating frequently.  Use your albuterol inhaler every 4-6 hours  If any further syncopal episodes to the emergency room  If diarrhea persists try Imodium one every 6 hours if needed  If cough is not improving in the next couple of days please return for recheck

## 2014-06-12 NOTE — Progress Notes (Signed)
Subjective: 49 year old lady who has been here couple weeks ago with a cough and possible pneumonia. She was treated with Levaquin and routine cough syrup and benzonatate. She has continued to be sick. She got a little better, but persisted having loose stools and nausea. A week ago she started getting worse with more frequent diarrhea and vomiting. Yesterday she had several loose stools. Today she has not had any, but she is only a few bites. She has urinated a couple of times today. She feels weak and tired and has a constant cough. Not productive of much.  She apparently has a history of syncope. She has been evaluated at [facility]. The last time she passed out was about 3 weeks ago. Apparently this is not an uncommon event.  Objective: Ill appearance. TMs normal. Throat clear. She has a feeling of something in her throat, constantly clearing her voice as well as oriented have prolonged episodes of coughing. Her throat was not erythematous. Neck supple without nodes. Chest and no rhonchi, rales, wheezes. Heart regular without murmurs. Abdomen soft but tender in the right lower quadrant. She says coughing is hurt her abdomen as well as her back. She has a history of sciatica and is hurting fairly badly in her back.  Patient was sitting around for a chest x-ray. Weaning the x-ray she had a syncopal episode after the films were taken. She was sitting on the stool, and fell over onto the floor. She was a little confused initially, but cleared up quickly. No injury noted.  [facility] reading (PRIMARY) by  Dr. Alwyn Ren Normal chest x-ray  Results for orders placed or performed in visit on 06/12/14  POCT CBC  Result Value Ref Range   WBC 5.5 4.6 - 10.2 K/uL   Lymph, poc 1.8 0.6 - 3.4   POC LYMPH PERCENT 33.0 10 - 50 %L   MID (cbc) 0.5 0 - 0.9   POC MID % 9.3 0 - 12 %M   POC Granulocyte 3.2 2 - 6.9   Granulocyte percent 57.7 37 - 80 %G   RBC 4.70 4.04 - 5.48 M/uL   Hemoglobin 13.3 12.2 - 16.2 g/dL   HCT, POC 42.6 83.4 - 47.9 %   MCV 89.1 80 - 97 fL   MCH, POC 28.3 27 - 31.2 pg   MCHC 31.8 31.8 - 35.4 g/dL   RDW, POC 19.6 %   Platelet Count, POC 249 142 - 424 K/uL   MPV 8.5 0 - 99.8 fL  POCT glucose (manual entry)  Result Value Ref Range   POC Glucose 159 (A) 70 - 99 mg/dl    Assessment: Severe cough Status post pneumonia Dehydration Syncope sciatica Right lower quadrant abdominal pain secondary to abdominal strain from coughing  Plan: An IV was begun on the patient after the second one episode. She remains alert and oriented. She drank a half liter bottle of water.  ekg normal  Hydrocodone causes itching. Apparently she has tolerated oxycodone with less itching. Sometimes take some Benadryl with it. Decided to try something to do this for her cough. It will incidentally help her back pain.  She was given a nebulizer treatment with albuterol. Cough seems a little better with that.  Depo-Medrol 120 mg IM  IV removed and patient being sent home at 9:45 PM. She is still sick and coughing a lot. Recommend she go to the ER if she gets in all worse.

## 2014-06-13 ENCOUNTER — Other Ambulatory Visit: Payer: Self-pay | Admitting: *Deleted

## 2014-06-13 LAB — BASIC METABOLIC PANEL
BUN: 17 mg/dL (ref 6–23)
CO2: 25 mEq/L (ref 19–32)
Calcium: 9 mg/dL (ref 8.4–10.5)
Chloride: 109 mEq/L (ref 96–112)
Creat: 0.85 mg/dL (ref 0.50–1.10)
Glucose, Bld: 156 mg/dL — ABNORMAL HIGH (ref 70–99)
Potassium: 3.6 mEq/L (ref 3.5–5.3)
Sodium: 141 mEq/L (ref 135–145)

## 2014-06-13 MED ORDER — ONDANSETRON 4 MG PO TBDP: ORAL_TABLET | ORAL | Status: DC

## 2014-06-24 DIAGNOSIS — Z9884 Bariatric surgery status: Secondary | ICD-10-CM | POA: Insufficient documentation

## 2014-06-24 DIAGNOSIS — R1314 Dysphagia, pharyngoesophageal phase: Secondary | ICD-10-CM

## 2014-06-24 DIAGNOSIS — K219 Gastro-esophageal reflux disease without esophagitis: Secondary | ICD-10-CM | POA: Insufficient documentation

## 2014-06-24 HISTORY — DX: Dysphagia, pharyngoesophageal phase: R13.14

## 2014-07-02 DIAGNOSIS — Z79891 Long term (current) use of opiate analgesic: Secondary | ICD-10-CM

## 2014-07-02 DIAGNOSIS — M542 Cervicalgia: Secondary | ICD-10-CM | POA: Insufficient documentation

## 2014-07-02 DIAGNOSIS — Z5181 Encounter for therapeutic drug level monitoring: Secondary | ICD-10-CM | POA: Insufficient documentation

## 2014-07-24 DIAGNOSIS — M545 Low back pain, unspecified: Secondary | ICD-10-CM | POA: Insufficient documentation

## 2014-08-01 DIAGNOSIS — F32A Depression, unspecified: Secondary | ICD-10-CM | POA: Insufficient documentation

## 2014-08-01 DIAGNOSIS — J45909 Unspecified asthma, uncomplicated: Secondary | ICD-10-CM | POA: Insufficient documentation

## 2014-09-24 ENCOUNTER — Ambulatory Visit (INDEPENDENT_AMBULATORY_CARE_PROVIDER_SITE_OTHER): Payer: Medicare Other | Admitting: Internal Medicine

## 2014-09-24 VITALS — BP 116/66 | HR 101 | Temp 98.3°F | Resp 18 | Ht 66.0 in | Wt 187.0 lb

## 2014-09-24 DIAGNOSIS — G4459 Other complicated headache syndrome: Secondary | ICD-10-CM

## 2014-09-24 DIAGNOSIS — R6884 Jaw pain: Secondary | ICD-10-CM | POA: Insufficient documentation

## 2014-09-24 MED ORDER — KETOROLAC TROMETHAMINE 60 MG/2ML IM SOLN
60.0000 mg | Freq: Once | INTRAMUSCULAR | Status: AC
  Administered 2014-09-24: 60 mg via INTRAMUSCULAR

## 2014-09-24 NOTE — Progress Notes (Signed)
Subjective:    Patient ID: [name], female    DOB: [birth year], 49 y.o.   MRN: [id number]  This chart was scribed for Tonye Pearson, MD by Ronney Lion, ED Scribe. This patient was seen in room 14 and the patient's care was started at 2:15 PM.   HPI   Chief Complaint  Patient presents with  . Migraine    x4 mths now   . Ear Pain    x3 days left ear only      HPI Comments: [name] is a 49 y.o. female who presents to the [facility] complaining of an intermittent, left frontal migraine headache that began 4 months ago, in January. Almost daily since tho improving a bit since starting topamax. Patient's last episode began 4 days ago, and she complains that this particular episode is accompanied by associated sharp L otalgia that radiates down to her throat/neck. At the onset 4 days ago, she raised her head up and felt a pop around the left ear without a change in hearing. She also experiences pain biting down and opening her mouth. She has never had a gel problem before.If she lies down a certain way, she feels a sharp pain in her left frontal head that feels like a needle is sticking in her eye.  Patient has a history of migraine headaches or at least some type of headache syndrome. She endorses blurred vision and photophobia with her headaches. Also nausea and occasional vomiting. She has no aura.       She also has intermittent lightheaded dizziness for no reason unrelated to the headaches. Patient had a tilt test which revealed "vertigo syncope." See extensive evaluation at the medical centers.  For her headaches she takes  a half oxycodone (which was prescribed for her back), as well as ibuprofen, which "mellows out" the pain, but is not sufficient to make it go away completely. She may take this more than once a day. Patient did take imitrex in the past, but can't remember really worked and stopped it because of insurance issues in obtaining that. She  uses Zofran for the associated nausea with the migraines.  Patient also takes Meloxicam for sciatica and fibromyalgia -- she sees someone at [facility] for this. She denies a history of back surgery. She also has arthritis. Patient still takes trazodone at bedtime.   PCP: Dr. Zoe Lan at [facility].   She denies rhinorrhea or fever.      Patient Active Problem List   Diagnosis Date Noted  . Other complicated headache syndrome 09/24/2014  . Jaw pain-acute TMJ 09/24/14 09/24/2014  . Protein-calorie malnutrition, severe 11/03/2012  . Severe malnutrition 11/03/2012  . Lethargy 11/01/2012  . Drug overdose, multiple drugs 11/01/2012  . Hypokalemia 11/01/2012  . Depression 11/22/2011  . Chronic pain 11/22/2011  . Grave's disease   . Asthma   . GERD (gastroesophageal reflux disease)    Prior to Admission medications   Medication Sig Start Date End Date Taking? Authorizing Provider  albuterol (PROVENTIL HFA;VENTOLIN HFA) 108 (90 BASE) MCG/ACT inhaler Inhale 2 puffs into the lungs every 6 (six) hours as needed for wheezing.   not recent  Yes Historical Provider, MD  azelastine (ASTELIN) 137 MCG/SPRAY nasal spray Place 1 spray into the nose 2 (two) times daily. Use in each nostril as directed   Yes Historical Provider, MD  Epinastine HCl (ELESTAT) 0.05 % ophthalmic solution Place 1 drop into both eyes 2 (two)  times daily as needed (itching).    Yes Historical Provider, MD  EPINEPHrine (EPIPEN) 0.3 mg/0.3 mL DEVI Inject 0.3 mg into the muscle as needed. For allergic reaction to stinging insects, mushrooms, and shellfish    Yes Historical Provider, MD  estradiol (ESTRACE) 0.5 MG tablet Take 0.5 mg by mouth daily.   Yes Historical Provider, MD  Guaifenesin-Codeine 200-20 MG/5ML LIQD Take 5 ml PO BID prn cough 05/30/14  Yes Thao P Le, DO  ibuprofen (ADVIL,MOTRIN) 200 MG tablet Take 800 mg by mouth every 6 (six) hours as needed for moderate pain.   Yes Historical Provider, MD  meclizine (ANTIVERT) 25 MG  tablet Take 1 tablet (25 mg total) by mouth every 6 (six) hours as needed for dizziness. 10/12/13  not recent  Yes Purvis Sheffield, MD  meloxicam (MOBIC) 7.5 MG tablet Take 7.5 mg by mouth 2 (two) times daily.    Yes Historical Provider, MD  Menthol-Methyl Salicylate (BEN GAY GREASELESS) 10-15 % greaseless cream Apply 1 application topically 3 (three) times daily as needed for pain.   Yes Historical Provider, MD  methocarbamol (ROBAXIN) 500 MG tablet Take 1,000 mg by mouth 2 (two) times daily.   not recent  Yes Historical Provider, MD  mometasone-formoterol (DULERA) 100-5 MCG/ACT AERO Inhale 2 puffs into the lungs 2 (two) times daily as needed for wheezing or shortness of breath.    not recent  Yes Historical Provider, MD  Multiple Vitamins-Minerals (MULTIVITAMIN WITH MINERALS) tablet Take 1 tablet by mouth daily.   Yes Historical Provider, MD  ondansetron (ZOFRAN-ODT) 4 MG disintegrating tablet Take one every 6 hours as needed for nausea or vomiting 06/13/14  Yes Peyton Najjar, MD  oxyCODONE-acetaminophen (PERCOCET/ROXICET) 5-325 MG per tablet Take one every 6 hours if needed for cough 06/12/14  Yes Peyton Najjar, MD  ranitidine (ZANTAC) 150 MG tablet Take 150 mg by mouth daily.   Yes Historical Provider, MD  topiramate (TOPAMAX) 50 MG tablet Take 100 mg by mouth 2 (two) times daily.   Yes Historical Provider, MD  traZODone (DESYREL) 100 MG tablet Take 100 mg by mouth at bedtime.   Yes Historical Provider, MD  BIOTIN 5000 PO Take 5,000 Units by mouth daily.     Historical Provider, MD  cetirizine (ZYRTEC) 10 MG tablet Take 10 mg by mouth daily.   occasional   Historical Provider, MD  cholecalciferol (VITAMIN D) 1000 UNITS tablet Take 1,000 Units by mouth daily.     Historical Provider, MD  cyclobenzaprine (FLEXERIL) 10 MG tablet Take 1 tablet (10 mg total) by mouth 2 (two) times daily as needed for muscle spasms. 02/23/14  occasional   Renne Crigler, PA-C  diphenhydrAMINE (BENADRYL) 25 mg capsule Take  25 mg by mouth every 6 (six) hours as needed for itching, allergies or sleep.     Historical Provider, MD      Review of Systems  Constitutional: Negative for fever, chills and unexpected weight change.  HENT: Negative for hearing loss, mouth sores, rhinorrhea, sinus pressure and sore throat.   Eyes: Positive for photophobia and visual disturbance (blurred vision).  Respiratory: Negative for shortness of breath.   Cardiovascular: Negative for chest pain.  Gastrointestinal: Positive for nausea.  Neurological: Positive for dizziness and light-headedness. Negative for tremors and weakness.  Psychiatric/Behavioral: Negative for confusion.   past history of psychiatric problems but none now     Objective:   Physical Exam  Constitutional: She is oriented to person, place, and time. She appears well-developed  and well-nourished. She appears distressed.  Sitting in a dark room, with marked photophobia.  HENT:  Head: Normocephalic and atraumatic.  Right Ear: External ear normal.  Left Ear: External ear normal.  Nose: Nose normal.  Canals and TMs are clear. Oropharynx is clear, including no evidence for dental abscess. Markedly tender left TMJ, with difficulty opening mouth, but no dislocation.  Eyes: Conjunctivae and EOM are normal. Pupils are equal, round, and reactive to light.  Neck: Normal range of motion. Neck supple. No thyromegaly present.  Cardiovascular: Normal rate, regular rhythm and normal heart sounds.   Pulmonary/Chest: Effort normal and breath sounds normal.  Lymphadenopathy:    She has no cervical adenopathy.  Neurological: She is alert and oriented to person, place, and time. She has normal reflexes. She displays normal reflexes. No cranial nerve deficit. Coordination normal.  Skin: No rash noted.  Psychiatric: She has a normal mood and affect. Her behavior is normal. Thought content normal.  Nursing note and vitals reviewed.  BP 116/66 mmHg  Pulse 101  Temp(Src) 98.3  F (36.8 C) (Oral)  Resp 18  Ht 5\' 6"  (1.676 m)  Wt 187 lb (84.823 kg)  BMI 30.20 kg/m2  SpO2 98%       Assessment & Plan:  Other complicated headache syndrome - Plan: ketorolac (TORADOL) injection 60 mg for immediate relief and then she can continue her current protocol per her PCP. This does not sound like a migraine syndrome unless it's fairly atypical. She has been under the care of neurology in the past but dropped that when she lost her insurance. She is not interested in a referral at this point but will return to her PCP.  Jaw pain -new onset temporomandibular joint pain--- this is the main source of her pain today. She is asked to restart a muscle relaxer at bedtime and she has prescriptions for 2 of those. She is also asked to take her meloxicam for a total of 15 mg a day. She is referred to her dentist for acute intervention and potential long-term treatment.   No other meds were prescribed today  Meds ordered this encounter  Medications  . ketorolac (TORADOL) injection 60 mg    Sig:     I have completed the patient encounter in its entirety as documented by the scribe, with editing by me where necessary. Robert P. , M.D.

## 2014-10-10 ENCOUNTER — Ambulatory Visit (INDEPENDENT_AMBULATORY_CARE_PROVIDER_SITE_OTHER): Payer: 59 | Admitting: Psychiatry

## 2014-10-10 ENCOUNTER — Encounter (HOSPITAL_COMMUNITY): Payer: Self-pay | Admitting: Psychiatry

## 2014-10-10 VITALS — BP 110/68 | HR 78 | Ht 65.0 in | Wt 187.8 lb

## 2014-10-10 DIAGNOSIS — F411 Generalized anxiety disorder: Secondary | ICD-10-CM | POA: Insufficient documentation

## 2014-10-10 DIAGNOSIS — F333 Major depressive disorder, recurrent, severe with psychotic symptoms: Secondary | ICD-10-CM | POA: Diagnosis not present

## 2014-10-10 DIAGNOSIS — F4001 Agoraphobia with panic disorder: Secondary | ICD-10-CM | POA: Insufficient documentation

## 2014-10-10 DIAGNOSIS — F4010 Social phobia, unspecified: Secondary | ICD-10-CM | POA: Insufficient documentation

## 2014-10-10 MED ORDER — CITALOPRAM HYDROBROMIDE 20 MG PO TABS: 20.0000 mg | ORAL_TABLET | Freq: Every day | ORAL | Status: DC

## 2014-10-10 MED ORDER — HYDROXYZINE PAMOATE 25 MG PO CAPS: 25.0000 mg | ORAL_CAPSULE | Freq: Every day | ORAL | Status: DC | PRN

## 2014-10-10 MED ORDER — QUETIAPINE FUMARATE 50 MG PO TABS: 50.0000 mg | ORAL_TABLET | Freq: Every day | ORAL | Status: DC
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
## 2014-10-24 ENCOUNTER — Other Ambulatory Visit: Payer: Self-pay | Admitting: Women's Health

## 2014-10-24 DIAGNOSIS — Z1231 Encounter for screening mammogram for malignant neoplasm of breast: Secondary | ICD-10-CM

## 2014-10-30 ENCOUNTER — Ambulatory Visit (HOSPITAL_COMMUNITY): Payer: Medicaid Other

## 2014-10-30 ENCOUNTER — Ambulatory Visit (HOSPITAL_COMMUNITY)
Admission: RE | Admit: 2014-10-30 | Discharge: 2014-10-30 | Disposition: A | Discharge status: Home / Self Care | Payer: Medicare Other | Source: Ambulatory Visit | Attending: Women's Health | Admitting: Women's Health

## 2014-10-30 ENCOUNTER — Other Ambulatory Visit: Payer: Self-pay | Admitting: Women's Health

## 2014-10-30 DIAGNOSIS — Z1231 Encounter for screening mammogram for malignant neoplasm of breast: Secondary | ICD-10-CM | POA: Insufficient documentation

## 2014-10-31 ENCOUNTER — Encounter: Payer: Self-pay | Admitting: Women's Health

## 2014-11-14 DIAGNOSIS — Z6841 Body Mass Index (BMI) 40.0 and over, adult: Secondary | ICD-10-CM | POA: Insufficient documentation

## 2014-11-14 DIAGNOSIS — E6609 Other obesity due to excess calories: Secondary | ICD-10-CM | POA: Insufficient documentation

## 2014-11-14 DIAGNOSIS — E669 Obesity, unspecified: Secondary | ICD-10-CM | POA: Insufficient documentation

## 2014-11-14 HISTORY — DX: Body Mass Index (BMI) 40.0 and over, adult: Z684

## 2014-11-14 HISTORY — DX: Morbid (severe) obesity due to excess calories: E66.01

## 2014-12-10 ENCOUNTER — Encounter (HOSPITAL_COMMUNITY): Payer: Self-pay | Admitting: Psychiatry

## 2014-12-10 ENCOUNTER — Other Ambulatory Visit (HOSPITAL_COMMUNITY): Payer: Self-pay | Admitting: Psychiatry

## 2014-12-10 ENCOUNTER — Ambulatory Visit (INDEPENDENT_AMBULATORY_CARE_PROVIDER_SITE_OTHER): Payer: 59 | Admitting: Psychiatry

## 2014-12-10 VITALS — BP 108/76 | HR 95 | Ht 65.5 in | Wt 196.6 lb

## 2014-12-10 DIAGNOSIS — F322 Major depressive disorder, single episode, severe without psychotic features: Secondary | ICD-10-CM | POA: Diagnosis not present

## 2014-12-10 DIAGNOSIS — F4010 Social phobia, unspecified: Secondary | ICD-10-CM

## 2014-12-10 DIAGNOSIS — F333 Major depressive disorder, recurrent, severe with psychotic symptoms: Secondary | ICD-10-CM

## 2014-12-10 DIAGNOSIS — F418 Other specified anxiety disorders: Secondary | ICD-10-CM | POA: Diagnosis not present

## 2014-12-10 DIAGNOSIS — F411 Generalized anxiety disorder: Secondary | ICD-10-CM

## 2014-12-10 DIAGNOSIS — F4001 Agoraphobia with panic disorder: Secondary | ICD-10-CM

## 2014-12-10 MED ORDER — QUETIAPINE FUMARATE 100 MG PO TABS: 100.0000 mg | ORAL_TABLET | Freq: Every day | ORAL | Status: DC

## 2014-12-10 MED ORDER — CITALOPRAM HYDROBROMIDE 20 MG PO TABS: 20.0000 mg | ORAL_TABLET | Freq: Every day | ORAL | Status: DC
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
## 2015-02-10 ENCOUNTER — Other Ambulatory Visit (HOSPITAL_COMMUNITY): Payer: Self-pay | Admitting: Psychiatry

## 2015-02-10 DIAGNOSIS — F4001 Agoraphobia with panic disorder: Secondary | ICD-10-CM

## 2015-02-10 DIAGNOSIS — F411 Generalized anxiety disorder: Secondary | ICD-10-CM

## 2015-02-10 DIAGNOSIS — F4010 Social phobia, unspecified: Secondary | ICD-10-CM

## 2015-02-10 DIAGNOSIS — F333 Major depressive disorder, recurrent, severe with psychotic symptoms: Secondary | ICD-10-CM

## 2015-02-10 MED ORDER — CITALOPRAM HYDROBROMIDE 20 MG PO TABS: 20.0000 mg | ORAL_TABLET | Freq: Every day | ORAL | Status: DC

## 2015-02-10 MED ORDER — QUETIAPINE FUMARATE 100 MG PO TABS: 100.0000 mg | ORAL_TABLET | Freq: Every day | ORAL | Status: DC

## 2015-02-13 ENCOUNTER — Emergency Department (HOSPITAL_COMMUNITY)
Admission: EM | Admit: 2015-02-13 | Discharge: 2015-02-13 | Disposition: A | Discharge status: Home / Self Care | Payer: Medicare Other | Attending: Emergency Medicine | Admitting: Emergency Medicine

## 2015-02-13 ENCOUNTER — Encounter (HOSPITAL_COMMUNITY): Payer: Self-pay | Admitting: Emergency Medicine

## 2015-02-13 DIAGNOSIS — Z7951 Long term (current) use of inhaled steroids: Secondary | ICD-10-CM | POA: Insufficient documentation

## 2015-02-13 DIAGNOSIS — F329 Major depressive disorder, single episode, unspecified: Secondary | ICD-10-CM | POA: Insufficient documentation

## 2015-02-13 DIAGNOSIS — H6091 Unspecified otitis externa, right ear: Secondary | ICD-10-CM | POA: Diagnosis not present

## 2015-02-13 DIAGNOSIS — R55 Syncope and collapse: Secondary | ICD-10-CM | POA: Diagnosis present

## 2015-02-13 DIAGNOSIS — Z8639 Personal history of other endocrine, nutritional and metabolic disease: Secondary | ICD-10-CM | POA: Insufficient documentation

## 2015-02-13 DIAGNOSIS — Z791 Long term (current) use of non-steroidal anti-inflammatories (NSAID): Secondary | ICD-10-CM | POA: Insufficient documentation

## 2015-02-13 DIAGNOSIS — K219 Gastro-esophageal reflux disease without esophagitis: Secondary | ICD-10-CM | POA: Insufficient documentation

## 2015-02-13 DIAGNOSIS — J309 Allergic rhinitis, unspecified: Secondary | ICD-10-CM | POA: Diagnosis not present

## 2015-02-13 DIAGNOSIS — H6121 Impacted cerumen, right ear: Secondary | ICD-10-CM | POA: Insufficient documentation

## 2015-02-13 DIAGNOSIS — Z79899 Other long term (current) drug therapy: Secondary | ICD-10-CM | POA: Insufficient documentation

## 2015-02-13 LAB — BASIC METABOLIC PANEL
Anion gap: 7 (ref 5–15)
BUN: 14 mg/dL (ref 6–20)
CO2: 23 mmol/L (ref 22–32)
Calcium: 9 mg/dL (ref 8.9–10.3)
Chloride: 112 mmol/L — ABNORMAL HIGH (ref 101–111)
Creatinine, Ser: 0.81 mg/dL (ref 0.44–1.00)
GFR calc Af Amer: >60 mL/min (ref >60)
GFR calc non Af Amer: >60 mL/min (ref >60)
Glucose, Bld: 84 mg/dL (ref 65–99)
Potassium: 4.2 mmol/L (ref 3.5–5.1)
Sodium: 142 mmol/L (ref 135–145)

## 2015-02-13 LAB — URINALYSIS, ROUTINE W REFLEX MICROSCOPIC
Bilirubin Urine: NEGATIVE
Glucose, UA: NEGATIVE mg/dL
Hgb urine dipstick: NEGATIVE
Ketones, ur: NEGATIVE mg/dL
Leukocytes, UA: NEGATIVE
Nitrite: NEGATIVE
Protein, ur: NEGATIVE mg/dL
Specific Gravity, Urine: 1.01 (ref 1.005–1.030)
Urobilinogen, UA: 0.2 mg/dL (ref 0.0–1.0)
pH: 6.5 (ref 5.0–8.0)

## 2015-02-13 LAB — CBG MONITORING, ED: Glucose-Capillary: 76 mg/dL (ref 65–99)

## 2015-02-13 MED ORDER — CARBAMIDE PEROXIDE 6.5 % OT SOLN
5.0000 [drp] | Freq: Two times a day (BID) | OTIC | Status: DC
  Administered 2015-02-13: 5 [drp] via OTIC
  Filled 2015-02-13: qty 15

## 2015-02-13 MED ORDER — MONTELUKAST SODIUM 10 MG PO TABS: 10.0000 mg | ORAL_TABLET | Freq: Every day | ORAL | Status: DC

## 2015-02-13 NOTE — ED Notes (Addendum)
Per EMS, hx of syncope/vertigo, passed out at hair salon, lowered to floor by bystanders, did lose consciousness. Initially awoke to painful stimuli, more frequent syncopal episodes recently. AxO = 4, CBG 93, continues to be lethargic at this time.  On entering the room patient extremely anxious, hyperventilating, O2 sat 98% on RA, breathing at least 36 times per minute, shivering. After using calm verbal instruction, patient able to slow breathing and stop shivering. Denies any pain, states she just feels dizzy. States she had been sitting under the blow dryers when she and upon standing up she had passed out.  States she had taken her prescribed hydroxyzine 25 mg and meclizine 25 mg right before passing out.

## 2015-02-13 NOTE — ED Provider Notes (Signed)
CSN: [id number]     Arrival date & time 02/13/15  1334 History   First MD Initiated Contact with Patient 02/13/15 1436     Chief Complaint  Patient presents with  . Loss of Consciousness  . Anxiety     (Consider location/radiation/quality/duration/timing/severity/associated sxs/prior Treatment) HPI   Patient is a 49 year old female with history of syncope and vertigo, she felt like her symptoms were coming on today and her coworkers helped lower her to the floor as she lost consciousness. She states that she had a positive tilt table test, she frequently experiences syncope, and this is relatively common for her.  She was initially very anxious when arriving at the ER via EMS. She is denying any pain but has some dizziness symptoms. She did take hydroxyzine and meclizine prior to her syncopal episodes. She does not have any visual disturbances, headache, chest pain, shortness of breath. She is complaining of external right ear pain with swelling which has resolved with administration of her home antibiotic eardrops. She states she's been having increased nasal discharge and sneezing with some streaks of blood. She uses a Flonase, Zyrtec and Claritin. She used to have quarterly allergy shots but her insurance does not pay for them.  She does not have any other symptoms  Past Medical History  Diagnosis Date  . Asthma   . GERD (gastroesophageal reflux disease)   . Grave's disease   . Fibromyalgia   . Environmental allergies   . Depression   . Sciatica   . Syncope and collapse   . Vasovagal syncope    Past Surgical History  Procedure Laterality Date  . Abdominal hysterectomy      partial  . Gastric bypass  2008   Family History  Problem Relation Age of Onset  . Hypertension Mother   . Cirrhosis Mother   . Alcohol abuse Mother   . Depression Mother   . Physical abuse Mother   . Hypertension Sister   . Alcohol abuse Sister   . Drug abuse Sister   . Depression Sister   . Anxiety  disorder Sister   . OCD Sister   . Diabetes Other   . Hypertension Other   . Diabetes Maternal Uncle   . Hypertension Father   . Seizures Cousin   . ADD / ADHD Other    Social History  Substance Use Topics  . Smoking status: Never Smoker   . Smokeless tobacco: Never Used  . Alcohol Use: 1.2 oz/week    1 Glasses of wine, 1 Shots of liquor, 0 Standard drinks or equivalent per week     Comment: 1-2x/week   OB History    Gravida Para Term Preterm AB TAB SAB Ectopic Multiple Living   2 2        2      Review of Systems  Constitutional: Negative for fever, activity change and fatigue.  Eyes: Negative.   Respiratory: Negative.   Cardiovascular: Negative.   Gastrointestinal: Negative.   Endocrine: Negative.   Genitourinary: Negative.   Musculoskeletal: Negative.   Skin: Negative.   Neurological: Positive for dizziness and syncope. Negative for tremors, seizures, facial asymmetry, speech difficulty, weakness, light-headedness, numbness and headaches.  Psychiatric/Behavioral: Negative.       Allergies  Bee venom; Coconut oil; Mushroom ext cmplx(shiitake-reishi-mait); Nutritional supplements; Other; Shellfish allergy; Strawberry; and Hydrocodone-acetaminophen  Home Medications   Prior to Admission medications   Medication Sig Start Date End Date Taking? Authorizing Provider  albuterol (PROVENTIL HFA;VENTOLIN HFA) 108 (90  BASE) MCG/ACT inhaler Inhale 2 puffs into the lungs every 6 (six) hours as needed for wheezing.   Yes Historical Provider, MD  azelastine (ASTELIN) 137 MCG/SPRAY nasal spray Place 1 spray into the nose 2 (two) times daily. Use in each nostril as directed   Yes Historical Provider, MD  BIOTIN 5000 PO Take 5,000 Units by mouth daily.    Yes Historical Provider, MD  cetirizine (ZYRTEC) 10 MG tablet Take 10 mg by mouth daily.   Yes Historical Provider, MD  cholecalciferol (VITAMIN D) 1000 UNITS tablet Take 1,000 Units by mouth daily.    Yes Historical Provider, MD   citalopram (CELEXA) 20 MG tablet Take 1 tablet (20 mg total) by mouth daily. 02/10/15 02/10/16 Yes Cleotis Nipper, MD  cyclobenzaprine (FLEXERIL) 10 MG tablet Take 1 tablet (10 mg total) by mouth 2 (two) times daily as needed for muscle spasms. 02/23/14  Yes Renne Crigler, PA-C  diphenhydrAMINE (BENADRYL) 25 MG tablet Take 50 mg by mouth at bedtime as needed for itching or sleep.   Yes Historical Provider, MD  Epinastine HCl (ELESTAT) 0.05 % ophthalmic solution Place 1 drop into both eyes 2 (two) times daily as needed (itching).    Yes Historical Provider, MD  EPINEPHrine (EPIPEN) 0.3 mg/0.3 mL DEVI Inject 0.3 mg into the muscle as needed. For allergic reaction to stinging insects, mushrooms, and shellfish    Yes Historical Provider, MD  estradiol (ESTRACE) 0.5 MG tablet Take 0.5 mg by mouth daily.   Yes Historical Provider, MD  hydrOXYzine (VISTARIL) 25 MG capsule Take 1 capsule (25 mg total) by mouth daily as needed for anxiety. 10/10/14  Yes Oletta Darter, MD  ibuprofen (ADVIL,MOTRIN) 800 MG tablet Take 800 mg by mouth every 8 (eight) hours as needed for moderate pain.   Yes Historical Provider, MD  meclizine (ANTIVERT) 25 MG tablet Take 1 tablet (25 mg total) by mouth every 6 (six) hours as needed for dizziness. 10/12/13  Yes Purvis Sheffield, MD  meloxicam (MOBIC) 7.5 MG tablet Take 7.5 mg by mouth 2 (two) times daily.    Yes Historical Provider, MD  Menthol-Methyl Salicylate (BEN GAY GREASELESS) 10-15 % greaseless cream Apply 1 application topically 3 (three) times daily as needed for pain.   Yes Historical Provider, MD  methocarbamol (ROBAXIN) 500 MG tablet Take 1,000 mg by mouth 2 (two) times daily.   Yes Historical Provider, MD  mometasone-formoterol (DULERA) 100-5 MCG/ACT AERO Inhale 2 puffs into the lungs 2 (two) times daily as needed for wheezing or shortness of breath.    Yes Historical Provider, MD  Multiple Vitamins-Minerals (MULTIVITAMIN WITH MINERALS) tablet Take 1 tablet by mouth daily.    Yes Historical Provider, MD  neomycin-polymyxin-hydrocortisone (CORTISPORIN) 3.5-10000-1 otic suspension Place 4 drops into the right ear 4 (four) times daily as needed (pain).   Yes Historical Provider, MD  ondansetron (ZOFRAN-ODT) 4 MG disintegrating tablet Take one every 6 hours as needed for nausea or vomiting 06/13/14  Yes Peyton Najjar, MD  orphenadrine (NORFLEX) 100 MG tablet Take 100 mg by mouth daily.   Yes Historical Provider, MD  oxyCODONE-acetaminophen (PERCOCET/ROXICET) 5-325 MG per tablet Take one every 6 hours if needed for cough Patient taking differently: Take 1 tablet by mouth every 6 (six) hours as needed (sciatica).  06/12/14  Yes Peyton Najjar, MD  QUEtiapine (SEROQUEL) 100 MG tablet Take 1 tablet (100 mg total) by mouth at bedtime. 02/10/15 02/10/16 Yes Cleotis Nipper, MD  ranitidine (ZANTAC) 150 MG tablet Take  150 mg by mouth daily.   Yes Historical Provider, MD  benzonatate (TESSALON) 200 MG capsule Take 1 capsule (200 mg total) by mouth 3 (three) times daily as needed for cough. Patient not taking: Reported on 02/13/2015 06/12/14   Peyton Najjar, MD  Guaifenesin-Codeine 200-20 MG/5ML LIQD Take 5 ml PO BID prn cough Patient not taking: Reported on 10/10/2014 05/30/14   Thao P Le, DO  levofloxacin (LEVAQUIN) 500 MG tablet Take 1 tablet (500 mg total) by mouth daily. Patient not taking: Reported on 02/13/2015 05/31/14   Thao P Le, DO  montelukast (SINGULAIR) 10 MG tablet Take 1 tablet (10 mg total) by mouth at bedtime. 02/13/15   Danelle Berry, PA-C   BP 105/58 mmHg  Pulse 95  Temp(Src) 97.4 F (36.3 C)  Resp 17  SpO2 100% Physical Exam  Constitutional: She is oriented to person, place, and time. She appears well-developed and well-nourished. No distress.  HENT:  Head: Normocephalic and atraumatic.  Right Ear: External ear normal.  Left Ear: External ear normal.  Nose: Nose normal.  Mouth/Throat: Oropharynx is clear and moist. No oropharyngeal exudate.  Boggy pale nasal  turbinates, no sinus tenderness to palpation, the visible portion of right external auditory canal erythematous with cerumen impaction, new drainage noted, mild tragus tenderness, no mastoid process tenderness  Eyes: Conjunctivae and EOM are normal. Pupils are equal, round, and reactive to light. Right eye exhibits no discharge. Left eye exhibits no discharge. No scleral icterus.  Neck: Normal range of motion. No JVD present. No tracheal deviation present. No thyromegaly present.  Cardiovascular: Normal rate, regular rhythm, normal heart sounds and intact distal pulses.  Exam reveals no gallop and no friction rub.   No murmur heard. Pulmonary/Chest: Effort normal and breath sounds normal. No respiratory distress. She has no wheezes. She has no rales. She exhibits no tenderness.  Abdominal: Soft. Bowel sounds are normal. She exhibits no distension and no mass. There is no tenderness. There is no rebound and no guarding.  Musculoskeletal: Normal range of motion. She exhibits no edema or tenderness.  Lymphadenopathy:    She has no cervical adenopathy.  Neurological: She is alert and oriented to person, place, and time. She has normal reflexes. No cranial nerve deficit. She exhibits normal muscle tone. Coordination normal.  Skin: Skin is warm and dry. No rash noted. She is not diaphoretic. No erythema. No pallor.  Psychiatric: She has a normal mood and affect. Her behavior is normal. Judgment and thought content normal.  Nursing note and vitals reviewed.      ED Course  Procedures (including critical care time) Labs Review Labs Reviewed  BASIC METABOLIC PANEL - Abnormal; Notable for the following:    Chloride 112 (*)    All other components within normal limits  URINALYSIS, ROUTINE W REFLEX MICROSCOPIC (NOT AT [facility])  CBG MONITORING, ED    Imaging Review No results found. I have personally reviewed and evaluated these images and lab results as part of my medical decision-making.   EKG  Interpretation   Date/Time:  Thursday February 13 2015 14:13:58 EDT Ventricular Rate:  85 PR Interval:  169 QRS Duration: 84 QT Interval:  387 QTC Calculation: 460 R Axis:     Text Interpretation:  Sinus rhythm Low voltage, precordial leads Abnormal  R-wave progression, early transition Borderline T abnormalities, anterior  leads No acute changes Confirmed by Rhunette Croft, MD, Janey Genta [phone]) on  02/13/2015 3:03:26 PM      MDM   Final diagnoses:  Otitis externa, right  Cerumen impaction, right  Syncope, unspecified syncope type  Allergic rhinitis, unspecified allergic rhinitis type    Patient with a syncopal episode with a documented history of the same Patient had no other signs of trauma, no pain complaints, she did not strike her head  She also complains of vertigo and right ear pain Patient has a cerumen impaction, I have offered Debrox to give to her to use outpatient and to follow-up with her PCP She is on multiple medications for her allergies, her exam is consistent with allergic rhinitis. Prescribed Singulair to add to her regimen to see if it would help with her rhinitis.  All of her labs are unremarkable, patient was discharged in satisfactory condition with stable vital signs.      Danelle Berry, PA-C 02/21/15 7654  Derwood Kaplan, MD 02/24/15 6503

## 2015-02-13 NOTE — ED Notes (Signed)
Bed: WA21 Expected date:  Expected time:  Means of arrival:  Comments: Ems- Syncope

## 2015-02-13 NOTE — ED Notes (Signed)
IV attempts by EMS and two nurses with no success, and most recent attempt yielded blood sample but was unviable for venous access.   If additional attempts are needed, IV team should be consulted per charge RN.

## 2015-02-25 ENCOUNTER — Encounter (HOSPITAL_COMMUNITY): Payer: Self-pay | Admitting: Psychiatry

## 2015-02-25 ENCOUNTER — Ambulatory Visit (INDEPENDENT_AMBULATORY_CARE_PROVIDER_SITE_OTHER): Payer: 59 | Admitting: Psychiatry

## 2015-02-25 VITALS — BP 113/70 | HR 92 | Ht 65.0 in | Wt 206.0 lb

## 2015-02-25 DIAGNOSIS — F4010 Social phobia, unspecified: Secondary | ICD-10-CM

## 2015-02-25 DIAGNOSIS — F319 Bipolar disorder, unspecified: Secondary | ICD-10-CM | POA: Insufficient documentation

## 2015-02-25 DIAGNOSIS — F3181 Bipolar II disorder: Secondary | ICD-10-CM | POA: Diagnosis not present

## 2015-02-25 DIAGNOSIS — F4001 Agoraphobia with panic disorder: Secondary | ICD-10-CM | POA: Diagnosis not present

## 2015-02-25 DIAGNOSIS — F411 Generalized anxiety disorder: Secondary | ICD-10-CM | POA: Diagnosis not present

## 2015-02-25 DIAGNOSIS — F333 Major depressive disorder, recurrent, severe with psychotic symptoms: Secondary | ICD-10-CM

## 2015-02-25 MED ORDER — QUETIAPINE FUMARATE 100 MG PO TABS: 150.0000 mg | ORAL_TABLET | Freq: Every day | ORAL | Status: DC

## 2015-02-25 MED ORDER — HYDROXYZINE PAMOATE 25 MG PO CAPS: 25.0000 mg | ORAL_CAPSULE | Freq: Every day | ORAL | Status: DC | PRN

## 2015-02-25 MED ORDER — PAROXETINE HCL 20 MG PO TABS: 20.0000 mg | ORAL_TABLET | Freq: Every day | ORAL | Status: DC

## 2015-02-25 MED ORDER — LITHIUM CARBONATE ER 450 MG PO TBCR: 450.0000 mg | EXTENDED_RELEASE_TABLET | Freq: Two times a day (BID) | ORAL | Status: DC
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
## 2015-02-27 ENCOUNTER — Telehealth (HOSPITAL_COMMUNITY): Payer: Self-pay

## 2015-02-27 NOTE — Telephone Encounter (Signed)
Medication problem- Called pt back after she left a message she was confused about what 3 medications Dr. Michae Kava wanted her taking as when she went to pick up orders there were 4 prescriptions there.  Reviewed record and note from 02/26/15 to verify current orders.   Informed patient should now be taking started Paxil and Eskalith along with Hydroxyzine.  Patient stated they still had Seroquel so she just wanted to make sure she was to discontinue this medication.  Verified by Dr. Michae Kava note from evaluation from 02/25/15 this was the instructions and patient agreed with plan.

## 2015-03-25 ENCOUNTER — Encounter (HOSPITAL_COMMUNITY): Payer: Self-pay | Admitting: Psychiatry

## 2015-03-25 ENCOUNTER — Ambulatory Visit (INDEPENDENT_AMBULATORY_CARE_PROVIDER_SITE_OTHER): Payer: 59 | Admitting: Psychiatry

## 2015-03-25 VITALS — BP 102/75 | HR 98 | Ht 65.0 in | Wt 205.0 lb

## 2015-03-25 DIAGNOSIS — F4001 Agoraphobia with panic disorder: Secondary | ICD-10-CM

## 2015-03-25 DIAGNOSIS — F4010 Social phobia, unspecified: Secondary | ICD-10-CM | POA: Diagnosis not present

## 2015-03-25 DIAGNOSIS — Z79899 Other long term (current) drug therapy: Secondary | ICD-10-CM

## 2015-03-25 DIAGNOSIS — F3181 Bipolar II disorder: Secondary | ICD-10-CM | POA: Diagnosis not present

## 2015-03-25 MED ORDER — ESCITALOPRAM OXALATE 20 MG PO TABS: 20.0000 mg | ORAL_TABLET | Freq: Every day | ORAL | Status: DC

## 2015-03-25 MED ORDER — CARBAMAZEPINE 200 MG PO TABS: 200.0000 mg | ORAL_TABLET | Freq: Every day | ORAL | Status: DC
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
## 2015-03-26 ENCOUNTER — Other Ambulatory Visit (HOSPITAL_COMMUNITY): Payer: Self-pay

## 2015-04-03 ENCOUNTER — Ambulatory Visit (HOSPITAL_COMMUNITY)
Admission: RE | Admit: 2015-04-03 | Discharge: 2015-04-03 | Disposition: A | Discharge status: Home / Self Care | Payer: Medicare Other | Source: Ambulatory Visit | Attending: Psychiatry | Admitting: Psychiatry

## 2015-04-03 DIAGNOSIS — Z79899 Other long term (current) drug therapy: Secondary | ICD-10-CM | POA: Diagnosis not present

## 2015-05-08 ENCOUNTER — Ambulatory Visit (HOSPITAL_COMMUNITY): Payer: Self-pay | Admitting: Psychiatry

## 2015-05-27 ENCOUNTER — Ambulatory Visit (INDEPENDENT_AMBULATORY_CARE_PROVIDER_SITE_OTHER): Payer: 59 | Admitting: Psychiatry

## 2015-05-27 ENCOUNTER — Encounter (HOSPITAL_COMMUNITY): Payer: Self-pay | Admitting: Psychiatry

## 2015-05-27 VITALS — BP 103/73 | HR 84 | Resp 12 | Ht 65.0 in | Wt 217.4 lb

## 2015-05-27 DIAGNOSIS — F4010 Social phobia, unspecified: Secondary | ICD-10-CM

## 2015-05-27 DIAGNOSIS — F3181 Bipolar II disorder: Secondary | ICD-10-CM | POA: Diagnosis not present

## 2015-05-27 DIAGNOSIS — G4700 Insomnia, unspecified: Secondary | ICD-10-CM

## 2015-05-27 DIAGNOSIS — F411 Generalized anxiety disorder: Secondary | ICD-10-CM | POA: Diagnosis not present

## 2015-05-27 DIAGNOSIS — F4001 Agoraphobia with panic disorder: Secondary | ICD-10-CM | POA: Diagnosis not present

## 2015-05-27 MED ORDER — CARBAMAZEPINE 200 MG PO TABS: 400.0000 mg | ORAL_TABLET | Freq: Every day | ORAL | Status: DC

## 2015-05-27 MED ORDER — AMITRIPTYLINE HCL 50 MG PO TABS: 50.0000 mg | ORAL_TABLET | Freq: Every day | ORAL | Status: DC
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
## 2015-06-26 ENCOUNTER — Ambulatory Visit (HOSPITAL_COMMUNITY): Payer: Self-pay | Admitting: Psychiatry

## 2015-07-02 ENCOUNTER — Other Ambulatory Visit (HOSPITAL_COMMUNITY): Payer: Self-pay | Admitting: Psychiatry

## 2015-07-02 ENCOUNTER — Encounter (HOSPITAL_COMMUNITY): Payer: Self-pay

## 2015-07-02 ENCOUNTER — Emergency Department (HOSPITAL_COMMUNITY)
Admission: EM | Admit: 2015-07-02 | Discharge: 2015-07-02 | Disposition: A | Discharge status: Home / Self Care | Payer: Medicare Other | Attending: Emergency Medicine | Admitting: Emergency Medicine

## 2015-07-02 ENCOUNTER — Emergency Department (HOSPITAL_COMMUNITY): Payer: Medicare Other

## 2015-07-02 DIAGNOSIS — K219 Gastro-esophageal reflux disease without esophagitis: Secondary | ICD-10-CM | POA: Insufficient documentation

## 2015-07-02 DIAGNOSIS — M79621 Pain in right upper arm: Secondary | ICD-10-CM | POA: Insufficient documentation

## 2015-07-02 DIAGNOSIS — J45909 Unspecified asthma, uncomplicated: Secondary | ICD-10-CM | POA: Insufficient documentation

## 2015-07-02 DIAGNOSIS — R0789 Other chest pain: Secondary | ICD-10-CM | POA: Diagnosis not present

## 2015-07-02 DIAGNOSIS — Z793 Long term (current) use of hormonal contraceptives: Secondary | ICD-10-CM | POA: Diagnosis not present

## 2015-07-02 DIAGNOSIS — Z79899 Other long term (current) drug therapy: Secondary | ICD-10-CM | POA: Diagnosis not present

## 2015-07-02 DIAGNOSIS — M25511 Pain in right shoulder: Secondary | ICD-10-CM | POA: Diagnosis not present

## 2015-07-02 DIAGNOSIS — F329 Major depressive disorder, single episode, unspecified: Secondary | ICD-10-CM | POA: Insufficient documentation

## 2015-07-02 DIAGNOSIS — Z8639 Personal history of other endocrine, nutritional and metabolic disease: Secondary | ICD-10-CM | POA: Insufficient documentation

## 2015-07-02 DIAGNOSIS — R079 Chest pain, unspecified: Secondary | ICD-10-CM | POA: Diagnosis present

## 2015-07-02 DIAGNOSIS — M797 Fibromyalgia: Secondary | ICD-10-CM | POA: Insufficient documentation

## 2015-07-02 LAB — I-STAT TROPONIN, ED
Troponin i, poc: 0 ng/mL (ref 0.00–0.08)
Troponin i, poc: 0 ng/mL (ref 0.00–0.08)

## 2015-07-02 LAB — BASIC METABOLIC PANEL
Anion gap: 9 (ref 5–15)
BUN: 17 mg/dL (ref 6–20)
CO2: 27 mmol/L (ref 22–32)
Calcium: 9.1 mg/dL (ref 8.9–10.3)
Chloride: 103 mmol/L (ref 101–111)
Creatinine, Ser: 0.74 mg/dL (ref 0.44–1.00)
GFR calc Af Amer: >60 mL/min (ref >60)
GFR calc non Af Amer: >60 mL/min (ref >60)
Glucose, Bld: 106 mg/dL — ABNORMAL HIGH (ref 65–99)
Potassium: 4.6 mmol/L (ref 3.5–5.1)
Sodium: 139 mmol/L (ref 135–145)

## 2015-07-02 LAB — CBC
HCT: 40.9 % (ref 36.0–46.0)
Hemoglobin: 13 g/dL (ref 12.0–15.0)
MCH: 28.1 pg (ref 26.0–34.0)
MCHC: 31.8 g/dL (ref 30.0–36.0)
MCV: 88.3 fL (ref 78.0–100.0)
Platelets: 284 10*3/uL (ref 150–400)
RBC: 4.63 MIL/uL (ref 3.87–5.11)
RDW: 13.4 % (ref 11.5–15.5)
WBC: 7 10*3/uL (ref 4.0–10.5)

## 2015-07-02 MED ORDER — KETOROLAC TROMETHAMINE 30 MG/ML IJ SOLN
30.0000 mg | Freq: Once | INTRAMUSCULAR | Status: AC
  Administered 2015-07-02: 30 mg via INTRAVENOUS
  Filled 2015-07-02: qty 1

## 2015-07-02 NOTE — ED Notes (Signed)
Patient reports intermittent mid chest pain that radiates into the right arm since yesterday. Patient had 2 second LOC while in triage. Patient states she has vertical syncope.

## 2015-07-02 NOTE — Discharge Instructions (Signed)
There does not appear to be an emergent cause for your chest discomfort at this time. Your exam, labs, EKG and chest x-ray are all reassuring. Please follow-up with your doctor in the next 2-3 days for reevaluation. Return to ED for any new or worsening symptoms such as fever, worsening pain, difficulty breathing, vomiting.  Chest Wall Pain Chest wall pain is pain in or around the bones and muscles of your chest. Sometimes, an injury causes this pain. Sometimes, the cause may not be known. This pain may take several weeks or longer to get better. HOME CARE INSTRUCTIONS  Pay attention to any changes in your symptoms. Take these actions to help with your pain:   Rest as told by your health care provider.   Avoid activities that cause pain. These include any activities that use your chest muscles or your abdominal and side muscles to lift heavy items.   If directed, apply ice to the painful area:  Put ice in a plastic bag.  Place a towel between your skin and the bag.  Leave the ice on for 20 minutes, 2-3 times per day.  Take over-the-counter and prescription medicines only as told by your health care provider.  Do not use tobacco products, including cigarettes, chewing tobacco, and e-cigarettes. If you need help quitting, ask your health care provider.  Keep all follow-up visits as told by your health care provider. This is important. SEEK MEDICAL CARE IF:  You have a fever.  Your chest pain becomes worse.  You have new symptoms. SEEK IMMEDIATE MEDICAL CARE IF:  You have nausea or vomiting.  You feel sweaty or light-headed.  You have a cough with phlegm (sputum) or you cough up blood.  You develop shortness of breath.   This information is not intended to replace advice given to you by your health care provider. Make sure you discuss any questions you have with your health care provider.   Document Released: 05/10/2005 Document Revised: 01/29/2015 Document Reviewed:  08/05/2014 Elsevier Interactive Patient Education [employer]

## 2015-07-02 NOTE — ED Provider Notes (Signed)
CSN: [id number]     Arrival date & time 07/02/15  1444 History   First MD Initiated Contact with Patient 07/02/15 1502     Chief Complaint  Patient presents with  . Chest Pain  . Arm Pain     (Consider location/radiation/quality/duration/timing/severity/associated sxs/prior Treatment) HPI [name] is a 50 y.o. female history of fibromyalgia, syncope, comes in for evaluation of chest and arm pain. Patient reports over the past 3 days she has had constant right arm and shoulder pain that she characterizes as sharp, worse with movement and "like I slept on it wrong". She also reports associated chest pain that started today at approximately 2:00 PM when she reached up to brush her hair. She characterizes this sensation as sharp and radiates to her upper back. She has taken oxycodone and aspirin today without relief of her symptoms. She denies any fevers, chills, numbness or weakness, nausea or vomiting, shortness of breath, cough. She reports mild leg swelling on Monday, but has since resolved.  Past Medical History  Diagnosis Date  . Asthma   . GERD (gastroesophageal reflux disease)   . Grave's disease   . Fibromyalgia   . Environmental allergies   . Depression   . Sciatica   . Syncope and collapse   . Vasovagal syncope    Past Surgical History  Procedure Laterality Date  . Abdominal hysterectomy      partial  . Gastric bypass  2008   Family History  Problem Relation Age of Onset  . Hypertension Mother   . Cirrhosis Mother   . Alcohol abuse Mother   . Depression Mother   . Physical abuse Mother   . Hypertension Sister   . Alcohol abuse Sister   . Drug abuse Sister   . Depression Sister   . Anxiety disorder Sister   . OCD Sister   . Diabetes Other   . Hypertension Other   . Diabetes Maternal Uncle   . Hypertension Father   . Seizures Cousin   . ADD / ADHD Other    Social History  Substance Use Topics  . Smoking status: Never Smoker   . Smokeless tobacco: Never  Used  . Alcohol Use: 1.2 oz/week    1 Glasses of wine, 1 Shots of liquor, 0 Standard drinks or equivalent per week     Comment: 1-2x/week   OB History    Gravida Para Term Preterm AB TAB SAB Ectopic Multiple Living   2 2        2      Review of Systems  A 10 point review of systems was completed and was negative except for pertinent positives and negatives as mentioned in the history of present illness    Allergies  Bee venom; Coconut oil; Mushroom ext cmplx(shiitake-reishi-mait); Nutritional supplements; Other; Shellfish allergy; Strawberry extract; and Hydrocodone-acetaminophen  Home Medications   Prior to Admission medications   Medication Sig Start Date End Date Taking? Authorizing Provider  albuterol (PROVENTIL HFA;VENTOLIN HFA) 108 (90 BASE) MCG/ACT inhaler Inhale 2 puffs into the lungs every 6 (six) hours as needed for wheezing.   Yes Historical Provider, MD  amitriptyline (ELAVIL) 50 MG tablet Take 1 tablet (50 mg total) by mouth at bedtime. 05/27/15  Yes 07/25/15, MD  azelastine (ASTELIN) 137 MCG/SPRAY nasal spray Place 1 spray into the nose 2 (two) times daily. Use in each nostril as directed   Yes Historical Provider, MD  BIOTIN 5000 PO Take 5,000 Units by mouth daily.  Yes Historical Provider, MD  carbamazepine (TEGRETOL) 200 MG tablet Take 2 tablets (400 mg total) by mouth at bedtime. 05/27/15  Yes Oletta Darter, MD  cetirizine (ZYRTEC) 10 MG tablet Take 10 mg by mouth daily.   Yes Historical Provider, MD  cholecalciferol (VITAMIN D) 1000 UNITS tablet Take 1,000 Units by mouth daily.    Yes Historical Provider, MD  cyclobenzaprine (FLEXERIL) 10 MG tablet Take 1 tablet (10 mg total) by mouth 2 (two) times daily as needed for muscle spasms. 02/23/14  Yes Renne Crigler, PA-C  diphenhydrAMINE (BENADRYL) 25 MG tablet Take 50 mg by mouth at bedtime as needed for itching or sleep.   Yes Historical Provider, MD  Epinastine HCl (ELESTAT) 0.05 % ophthalmic solution Place 1 drop  into both eyes 2 (two) times daily as needed (itching).    Yes Historical Provider, MD  estradiol (ESTRACE) 0.5 MG tablet Take 0.5 mg by mouth daily.   Yes Historical Provider, MD  ibuprofen (ADVIL,MOTRIN) 800 MG tablet Take 800 mg by mouth every 8 (eight) hours as needed for moderate pain.   Yes Historical Provider, MD  meclizine (ANTIVERT) 25 MG tablet Take 1 tablet (25 mg total) by mouth every 6 (six) hours as needed for dizziness. 10/12/13  Yes Purvis Sheffield, MD  meloxicam (MOBIC) 7.5 MG tablet Take 7.5 mg by mouth daily as needed for pain.    Yes Historical Provider, MD  Menthol-Methyl Salicylate (BEN GAY GREASELESS) 10-15 % greaseless cream Apply 1 application topically 3 (three) times daily as needed for pain.   Yes Historical Provider, MD  mometasone-formoterol (DULERA) 100-5 MCG/ACT AERO Inhale 2 puffs into the lungs 2 (two) times daily as needed for wheezing or shortness of breath.    Yes Historical Provider, MD  montelukast (SINGULAIR) 10 MG tablet Take 1 tablet (10 mg total) by mouth at bedtime. 02/13/15  Yes Danelle Berry, PA-C  Multiple Vitamins-Minerals (MULTIVITAMIN WITH MINERALS) tablet Take 1 tablet by mouth daily.   Yes Historical Provider, MD  oxyCODONE-acetaminophen (PERCOCET/ROXICET) 5-325 MG per tablet Take one every 6 hours if needed for cough 06/12/14  Yes Peyton Najjar, MD  ranitidine (ZANTAC) 150 MG tablet Take 150 mg by mouth daily.   Yes Historical Provider, MD  benzonatate (TESSALON) 200 MG capsule Take 1 capsule (200 mg total) by mouth 3 (three) times daily as needed for cough. Patient not taking: Reported on 03/25/2015 06/12/14   Peyton Najjar, MD  EPINEPHrine (EPIPEN) 0.3 mg/0.3 mL DEVI Inject 0.3 mg into the muscle as needed. For allergic reaction to stinging insects, mushrooms, and shellfish     Historical Provider, MD  Guaifenesin-Codeine 200-20 MG/5ML LIQD Take 5 ml PO BID prn cough Patient not taking: Reported on 03/25/2015 05/30/14   Thao P Le, DO  levofloxacin  (LEVAQUIN) 500 MG tablet Take 1 tablet (500 mg total) by mouth daily. Patient not taking: Reported on 07/02/2015 05/31/14   Thao P Le, DO  neomycin-polymyxin-hydrocortisone (CORTISPORIN) 3.5-10000-1 otic suspension Place 4 drops into the right ear 4 (four) times daily as needed (pain).    Historical Provider, MD  ondansetron (ZOFRAN-ODT) 4 MG disintegrating tablet Take one every 6 hours as needed for nausea or vomiting Patient not taking: Reported on 05/27/2015 06/13/14   Peyton Najjar, MD   BP 107/62 mmHg  Pulse 105  Temp(Src) 98.2 F (36.8 C) (Oral)  Resp 17  Ht 5\' 5"  (1.651 m)  Wt 99.338 kg  BMI 36.44 kg/m2  SpO2 100% Physical Exam  Constitutional: She  is oriented to person, place, and time. She appears well-developed and well-nourished.  HENT:  Head: Normocephalic and atraumatic.  Mouth/Throat: Oropharynx is clear and moist.  Eyes: Conjunctivae are normal. Pupils are equal, round, and reactive to light. Right eye exhibits no discharge. Left eye exhibits no discharge. No scleral icterus.  Neck: Neck supple.  Cardiovascular: Normal rate, regular rhythm and normal heart sounds.  Exam reveals no gallop and no friction rub.   No murmur heard. Pulmonary/Chest: Effort normal and breath sounds normal. No respiratory distress. She has no wheezes. She has no rales. She exhibits tenderness.    Discomfort is replicated with palpation of sternum  And left parasternal musculature.  Abdominal: Soft. There is no tenderness.  Musculoskeletal: Normal range of motion.  Neurological: She is alert and oriented to person, place, and time.  Cranial Nerves II-XII grossly intact  Skin: Skin is warm and dry. No rash noted.  Psychiatric: She has a normal mood and affect.  Nursing note and vitals reviewed.   ED Course  Procedures (including critical care time) Labs Review Labs Reviewed  BASIC METABOLIC PANEL - Abnormal; Notable for the following:    Glucose, Bld 106 (*)    All other components within  normal limits  CBC  I-STAT TROPOININ, ED  Rosezena Sensor, ED    Imaging Review Dg Chest 2 View  07/02/2015  CLINICAL DATA:  Left-sided chest pain radiating into left arm, acute EXAM: CHEST  2 VIEW COMPARISON:  June 12, 2014 FINDINGS: Lungs are clear. Heart size and pulmonary vascularity are normal. No adenopathy. No pneumothorax. No bone lesions. IMPRESSION: No edema or consolidation. Electronically Signed   By: Bretta Bang III M.D.   On: 07/02/2015 15:37   I have personally reviewed and evaluated these images and lab results as part of my medical decision-making.   EKG Interpretation   Date/Time:  Wednesday July 02 2015 14:53:13 EST Ventricular Rate:  113 PR Interval:  179 QRS Duration: 77 QT Interval:  343 QTC Calculation: 470 R Axis:   0 Text Interpretation:  Sinus tachycardia Abnormal R-wave progression, early  transition Since last tracing rate faster Confirmed by KNAPP  MD-J, JON  ([id number]) on 07/02/2015 3:56:59 PM     Meds given in ED:  Medications  ketorolac (TORADOL) 30 MG/ML injection 30 mg (30 mg Intravenous Given 07/02/15 1639)    New Prescriptions   No medications on file   Filed Vitals:   07/02/15 1502 07/02/15 1817  BP: 144/78 107/62  Pulse: 105   Temp: 98.2 F (36.8 C)   TempSrc: Oral   Resp: 15 17  Height: 5\' 5"  (1.651 m)   Weight: 99.338 kg   SpO2: 100% 100%    MDM  Vitals stable  -afebrile Pt resting comfortably in ED. Chest discomfort is improving with Toradol PE--Lung examunremarkable. Cardiac auscultation reveals no murmurs rubs or gallops.discomfort is replicated with direct palpation of sternum and left parasternal musculature. Otherwise, Grossly Benign Physical Exam Labwork:DeltaTroponin negative. EKG reassuring.  Labs otherwise noncontributory Imaging: CXR showsno acute cardiopulmonary pathology  DDX: Patient with atypical chest discomfort likely due to MSK. Clinical picture and exam today not consistent with ACS/dissection.  Heart score 2. No evidence of spontaneous pneumothorax, esophageal rupture or other mediastinitis. Low Well's Score, doubt PE. No evidence of myocarditis, endocarditis, pericarditis.   I discussed all relevant lab findings and imaging results with pt and they verbalized understanding. Discussed f/u with PCP within 48 hrs and return precautions, pt very amenable to plan.  Final diagnoses:  Atypical chest pain       Joycie Peek, PA-C 07/02/15 0488  Linwood Dibbles, MD 07/02/15 [phone]

## 2015-07-07 ENCOUNTER — Encounter (HOSPITAL_COMMUNITY): Payer: Self-pay | Admitting: *Deleted

## 2015-07-07 ENCOUNTER — Ambulatory Visit (HOSPITAL_COMMUNITY): Payer: Self-pay | Admitting: Clinical

## 2015-07-07 ENCOUNTER — Emergency Department (HOSPITAL_COMMUNITY): Payer: Medicare Other

## 2015-07-07 ENCOUNTER — Other Ambulatory Visit (HOSPITAL_COMMUNITY): Payer: Self-pay | Admitting: Psychiatry

## 2015-07-07 ENCOUNTER — Emergency Department (HOSPITAL_COMMUNITY)
Admission: EM | Admit: 2015-07-07 | Discharge: 2015-07-07 | Disposition: A | Discharge status: Home / Self Care | Payer: Medicare Other | Attending: Emergency Medicine | Admitting: Emergency Medicine

## 2015-07-07 DIAGNOSIS — Z79818 Long term (current) use of other agents affecting estrogen receptors and estrogen levels: Secondary | ICD-10-CM | POA: Diagnosis not present

## 2015-07-07 DIAGNOSIS — Z8739 Personal history of other diseases of the musculoskeletal system and connective tissue: Secondary | ICD-10-CM | POA: Diagnosis not present

## 2015-07-07 DIAGNOSIS — R0602 Shortness of breath: Secondary | ICD-10-CM

## 2015-07-07 DIAGNOSIS — Z8639 Personal history of other endocrine, nutritional and metabolic disease: Secondary | ICD-10-CM | POA: Diagnosis not present

## 2015-07-07 DIAGNOSIS — K219 Gastro-esophageal reflux disease without esophagitis: Secondary | ICD-10-CM | POA: Insufficient documentation

## 2015-07-07 DIAGNOSIS — F329 Major depressive disorder, single episode, unspecified: Secondary | ICD-10-CM | POA: Diagnosis not present

## 2015-07-07 DIAGNOSIS — J45901 Unspecified asthma with (acute) exacerbation: Secondary | ICD-10-CM | POA: Diagnosis not present

## 2015-07-07 DIAGNOSIS — R062 Wheezing: Secondary | ICD-10-CM

## 2015-07-07 DIAGNOSIS — Z79899 Other long term (current) drug therapy: Secondary | ICD-10-CM | POA: Diagnosis not present

## 2015-07-07 DIAGNOSIS — Z7951 Long term (current) use of inhaled steroids: Secondary | ICD-10-CM | POA: Insufficient documentation

## 2015-07-07 DIAGNOSIS — Z9884 Bariatric surgery status: Secondary | ICD-10-CM | POA: Diagnosis not present

## 2015-07-07 MED ORDER — ALBUTEROL SULFATE (2.5 MG/3ML) 0.083% IN NEBU
5.0000 mg | INHALATION_SOLUTION | Freq: Once | RESPIRATORY_TRACT | Status: AC
  Administered 2015-07-07: 5 mg via RESPIRATORY_TRACT
  Filled 2015-07-07: qty 6

## 2015-07-07 MED ORDER — OXYMETAZOLINE HCL 0.05 % NA SOLN: 1.0000 | Freq: Two times a day (BID) | NASAL | Status: DC

## 2015-07-07 MED ORDER — BENZONATATE 100 MG PO CAPS: 200.0000 mg | ORAL_CAPSULE | Freq: Two times a day (BID) | ORAL | Status: DC | PRN

## 2015-07-07 NOTE — ED Provider Notes (Signed)
CSN: [id number]     Arrival date & time 07/07/15  1312 History   First MD Initiated Contact with Patient 07/07/15 1627     Chief Complaint  Patient presents with  . Shortness of Breath     (Consider location/radiation/quality/duration/timing/severity/associated sxs/prior Treatment) HPI   Patient is a 50 year old female with past medical history of asthma, GERD, fibromyalgia and depression who presents to the ED with complaint of shortness of breath, onset last night. Patient reports while she was at home last night she began having shortness of breath with associated wheezing. She notes she used her albuterol inhaler at home multiple times with mild intermittent relief. She also endorses having associated "dry/scratchy" throat, rhinorrhea and dry cough. She notes this episode is consistent with asthma exacerbation she has had in the past. Denies fever, chills, headache, lightheadedness, dizziness, visual changes, palpitations, abdominal pain, nausea, vomiting, diarrhea, numbness, weakness, leg swelling, immobilization/surgery, hx of DVT/PE, hemoptysis, hx of cancer. Patient notes she was seen in the ED on 07/02/15 for chest pain and states that her chest pain has improved over the past week.  Past Medical History  Diagnosis Date  . Asthma   . GERD (gastroesophageal reflux disease)   . Grave's disease   . Fibromyalgia   . Environmental allergies   . Depression   . Sciatica   . Syncope and collapse   . Vasovagal syncope    Past Surgical History  Procedure Laterality Date  . Abdominal hysterectomy      partial  . Gastric bypass  2008   Family History  Problem Relation Age of Onset  . Hypertension Mother   . Cirrhosis Mother   . Alcohol abuse Mother   . Depression Mother   . Physical abuse Mother   . Hypertension Sister   . Alcohol abuse Sister   . Drug abuse Sister   . Depression Sister   . Anxiety disorder Sister   . OCD Sister   . Diabetes Other   . Hypertension Other   .  Diabetes Maternal Uncle   . Hypertension Father   . Seizures Cousin   . ADD / ADHD Other    Social History  Substance Use Topics  . Smoking status: Never Smoker   . Smokeless tobacco: Never Used  . Alcohol Use: 1.2 oz/week    1 Glasses of wine, 1 Shots of liquor, 0 Standard drinks or equivalent per week     Comment: 1-2x/week   OB History    Gravida Para Term Preterm AB TAB SAB Ectopic Multiple Living   2 2        2      Review of Systems  HENT: Positive for rhinorrhea and sore throat.   Respiratory: Positive for cough, shortness of breath and wheezing.   Cardiovascular: Positive for chest pain.  All other systems reviewed and are negative.     Allergies  Bee venom; Coconut oil; Mushroom ext cmplx(shiitake-reishi-mait); Nutritional supplements; Other; Shellfish allergy; Strawberry extract; and Hydrocodone-acetaminophen  Home Medications   Prior to Admission medications   Medication Sig Start Date End Date Taking? Authorizing Provider  albuterol (PROVENTIL HFA;VENTOLIN HFA) 108 (90 BASE) MCG/ACT inhaler Inhale 2 puffs into the lungs every 6 (six) hours as needed for wheezing.   Yes Historical Provider, MD  amitriptyline (ELAVIL) 50 MG tablet Take 1 tablet (50 mg total) by mouth at bedtime. 05/27/15  Yes 07/25/15, MD  azelastine (ASTELIN) 137 MCG/SPRAY nasal spray Place 1 spray into the nose 2 (two) times  daily. Use in each nostril as directed   Yes Historical Provider, MD  BIOTIN 5000 PO Take 5,000 Units by mouth daily.    Yes Historical Provider, MD  carbamazepine (TEGRETOL) 200 MG tablet Take 2 tablets (400 mg total) by mouth at bedtime. 05/27/15  Yes Oletta Darter, MD  cetirizine (ZYRTEC) 10 MG tablet Take 10 mg by mouth daily.   Yes Historical Provider, MD  cholecalciferol (VITAMIN D) 1000 UNITS tablet Take 1,000 Units by mouth daily.    Yes Historical Provider, MD  cyclobenzaprine (FLEXERIL) 10 MG tablet Take 1 tablet (10 mg total) by mouth 2 (two) times daily as needed  for muscle spasms. 02/23/14  Yes Renne Crigler, PA-C  diphenhydrAMINE (BENADRYL) 25 MG tablet Take 50 mg by mouth at bedtime as needed for itching or sleep.   Yes Historical Provider, MD  Epinastine HCl (ELESTAT) 0.05 % ophthalmic solution Place 1 drop into both eyes 2 (two) times daily as needed (itching).    Yes Historical Provider, MD  EPINEPHrine (EPIPEN) 0.3 mg/0.3 mL DEVI Inject 0.3 mg into the muscle as needed. For allergic reaction to stinging insects, mushrooms, and shellfish    Yes Historical Provider, MD  estradiol (ESTRACE) 0.5 MG tablet Take 0.5 mg by mouth daily.   Yes Historical Provider, MD  hydrOXYzine (VISTARIL) 25 MG capsule Take 25 mg by mouth at bedtime. 07/02/15  Yes Historical Provider, MD  ibuprofen (ADVIL,MOTRIN) 800 MG tablet Take 800 mg by mouth every 8 (eight) hours as needed for moderate pain.   Yes Historical Provider, MD  meclizine (ANTIVERT) 25 MG tablet Take 1 tablet (25 mg total) by mouth every 6 (six) hours as needed for dizziness. 10/12/13  Yes Purvis Sheffield, MD  Menthol-Methyl Salicylate (BEN GAY GREASELESS) 10-15 % greaseless cream Apply 1 application topically 3 (three) times daily as needed for pain.   Yes Historical Provider, MD  mometasone-formoterol (DULERA) 100-5 MCG/ACT AERO Inhale 2 puffs into the lungs 2 (two) times daily as needed for wheezing or shortness of breath.    Yes Historical Provider, MD  Multiple Vitamins-Minerals (MULTIVITAMIN WITH MINERALS) tablet Take 1 tablet by mouth daily.   Yes Historical Provider, MD  oxyCODONE-acetaminophen (PERCOCET/ROXICET) 5-325 MG per tablet Take one every 6 hours if needed for cough 06/12/14  Yes Peyton Najjar, MD  ranitidine (ZANTAC) 150 MG tablet Take 150 mg by mouth daily.   Yes Historical Provider, MD  benzonatate (TESSALON) 100 MG capsule Take 2 capsules (200 mg total) by mouth 2 (two) times daily as needed for cough. 07/07/15   Barrett Henle, PA-C  Guaifenesin-Codeine 200-20 MG/5ML LIQD Take 5 ml PO  BID prn cough Patient not taking: Reported on 03/25/2015 05/30/14   Thao P Le, DO  montelukast (SINGULAIR) 10 MG tablet Take 1 tablet (10 mg total) by mouth at bedtime. Patient not taking: Reported on 07/07/2015 02/13/15   Danelle Berry, PA-C  oxymetazoline (AFRIN NASAL SPRAY) 0.05 % nasal spray Place 1 spray into both nostrils 2 (two) times daily. 07/07/15   Satira Sark Kennen Stammer, PA-C   BP 156/98 mmHg  Pulse 85  Temp(Src) 97.9 F (36.6 C) (Oral)  Resp 18  SpO2 100% Physical Exam  Constitutional: She is oriented to person, place, and time. She appears well-developed and well-nourished. No distress.  HENT:  Head: Normocephalic and atraumatic.  Nose: Rhinorrhea present. Right sinus exhibits no maxillary sinus tenderness and no frontal sinus tenderness. Left sinus exhibits no maxillary sinus tenderness and no frontal sinus tenderness.  Mouth/Throat:  Uvula is midline and mucous membranes are normal. Posterior oropharyngeal erythema present. No oropharyngeal exudate, posterior oropharyngeal edema or tonsillar abscesses.  Eyes: Conjunctivae and EOM are normal. Pupils are equal, round, and reactive to light. Right eye exhibits no discharge. Left eye exhibits no discharge. No scleral icterus.  Neck: Normal range of motion. Neck supple.  Cardiovascular: Normal rate, regular rhythm, normal heart sounds and intact distal pulses.   Pulmonary/Chest: Effort normal. No respiratory distress. She has wheezes (mild end-expiratory wheezes noted in lower lobes bilaterally). She has no rales (midsternal chest wall TTP). She exhibits tenderness.  Abdominal: Soft. Bowel sounds are normal. She exhibits no distension and no mass. There is no tenderness. There is no rebound and no guarding.  Musculoskeletal: Normal range of motion. She exhibits no edema.  Lymphadenopathy:    She has no cervical adenopathy.  Neurological: She is alert and oriented to person, place, and time.  Skin: Skin is warm and dry. She is not  diaphoretic.  Nursing note and vitals reviewed.   ED Course  Procedures (including critical care time) Labs Review Labs Reviewed - No data to display  Imaging Review Dg Chest 2 View  07/07/2015  CLINICAL DATA:  Shortness of breath which began last night. History of asthma. EXAM: CHEST  2 VIEW COMPARISON:  PA and lateral chest 07/02/2015. FINDINGS: The lungs are clear. Heart size is normal. No pneumothorax or pleural effusion. No focal bony abnormality. IMPRESSION: Negative chest.  Initial encounter. Electronically Signed   By: Drusilla Kanner M.D.   On: 07/07/2015 13:41   I have personally reviewed and evaluated these images and lab results as part of my medical decision-making.   EKG Interpretation None      Filed Vitals:   07/07/15 1323 07/07/15 1635  BP: 133/82 156/98  Pulse: 105 85  Temp: 98.2 F (36.8 C) 97.9 F (36.6 C)  Resp: 20 18     MDM   Final diagnoses:  SOB (shortness of breath)  Wheezing    Patient presented with shortness of breath, wheezing and dry cough consistent with asthma exacerbation she has had in the past. Endorses history of asthma. No relief with albuterol inhaler at home. VSS. Exam revealed rhinorrhea, post oral pharyngeal erythema, mild end-respiratory basilar wheezing, no signs of respiratory distress, mild tenderness over midsternal chest wall. EKG showed sinus rhythm. Chest x-ray negative. Pt given 1 albuterol neb tx in the ED.  Pt was seen in the ED on 07/02/15 for CP, negative cardiac workup, pt was d/c home and advsied to follow up with her PCP.  Patient reevaluated after breathing treatment. She reports  Her breathing has improved and she is feeling much better. On exam no wheezes noted, good air exchange noted in bilateral lung fields. I suspect patient's symptoms are likely due to asthma exacerbation. I have a low suspicion for ACS, PE, dissection, or other acute cardiac event at this time. Plan to discharge patient home with symptomatic tx.  Advised patient to follow up with her PCP.  Evaluation does not show pathology requring ongoing emergent intervention or admission. Pt is hemodynamically stable and mentating appropriately. Discussed findings/results and plan with patient/guardian, who agrees with plan. All questions answered. Return precautions discussed and outpatient follow up given.       Satira Sark [place], [place] 07/07/15 1748  Benjiman Core, MD 07/08/15 0001

## 2015-07-07 NOTE — ED Notes (Signed)
Pt reports SOB which started last night.  Pt has hx of asthma.  Has used her inhaler 4 times this am.  Pt's sats is 100% RA.   Pt unable to keep thermometer in her mouth d/t difficulty breathing.  Pt is A&OX4.

## 2015-07-07 NOTE — Discharge Instructions (Signed)
Take your medications as prescribed.Continue using your albuterol inhaler as needed at home. I recommend drinking lots of fluid to remain hydrated. Follow-up with your primary care provider at your scheduled appointment this week. Please return to the Emergency Department if symptoms worsen or new onset of fever, lightheadedness, dizziness, syncope, productive cough, chest pain, difficulty breathing, coughing up blood.

## 2015-07-09 ENCOUNTER — Telehealth (HOSPITAL_COMMUNITY): Payer: Self-pay

## 2015-07-09 DIAGNOSIS — F3181 Bipolar II disorder: Secondary | ICD-10-CM

## 2015-07-09 MED ORDER — CARBAMAZEPINE 200 MG PO TABS: 400.0000 mg | ORAL_TABLET | Freq: Every day | ORAL | Status: DC

## 2015-07-09 NOTE — Telephone Encounter (Signed)
Patient called in and needed a refill on her Tegretol. Dr Michae Kava gave a verbal okay and I sent Tegretol 200 mg 2 po qhs to [facility] on [address]. Patient was called and informed of the refill.

## 2015-07-17 ENCOUNTER — Other Ambulatory Visit (INDEPENDENT_AMBULATORY_CARE_PROVIDER_SITE_OTHER): Payer: Self-pay | Admitting: Physician Assistant

## 2015-07-17 DIAGNOSIS — Z9884 Bariatric surgery status: Secondary | ICD-10-CM

## 2015-07-21 ENCOUNTER — Ambulatory Visit
Admission: RE | Admit: 2015-07-21 | Discharge: 2015-07-21 | Disposition: A | Discharge status: Home / Self Care | Payer: Medicare Other | Source: Ambulatory Visit | Attending: Physician Assistant | Admitting: Physician Assistant

## 2015-07-21 DIAGNOSIS — Z9884 Bariatric surgery status: Secondary | ICD-10-CM

## 2015-07-22 ENCOUNTER — Ambulatory Visit (INDEPENDENT_AMBULATORY_CARE_PROVIDER_SITE_OTHER): Payer: 59 | Admitting: Psychiatry

## 2015-07-22 ENCOUNTER — Encounter (HOSPITAL_COMMUNITY): Payer: Self-pay | Admitting: Psychiatry

## 2015-07-22 VITALS — BP 132/86 | HR 85 | Ht 65.0 in | Wt 222.0 lb

## 2015-07-22 DIAGNOSIS — F3181 Bipolar II disorder: Secondary | ICD-10-CM | POA: Diagnosis not present

## 2015-07-22 DIAGNOSIS — G4700 Insomnia, unspecified: Secondary | ICD-10-CM

## 2015-07-22 DIAGNOSIS — F4001 Agoraphobia with panic disorder: Secondary | ICD-10-CM | POA: Diagnosis not present

## 2015-07-22 DIAGNOSIS — F411 Generalized anxiety disorder: Secondary | ICD-10-CM | POA: Diagnosis not present

## 2015-07-22 DIAGNOSIS — R45851 Suicidal ideations: Secondary | ICD-10-CM

## 2015-07-22 DIAGNOSIS — F4010 Social phobia, unspecified: Secondary | ICD-10-CM | POA: Diagnosis not present

## 2015-07-22 MED ORDER — LITHIUM CARBONATE ER 300 MG PO TBCR: 300.0000 mg | EXTENDED_RELEASE_TABLET | Freq: Two times a day (BID) | ORAL | Status: DC

## 2015-07-22 MED ORDER — HYDROXYZINE PAMOATE 50 MG PO CAPS: 50.0000 mg | ORAL_CAPSULE | Freq: Every day | ORAL | Status: DC

## 2015-07-22 MED ORDER — AMITRIPTYLINE HCL 50 MG PO TABS: 50.0000 mg | ORAL_TABLET | Freq: Every day | ORAL | Status: DC
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
## 2015-08-04 ENCOUNTER — Encounter (HOSPITAL_COMMUNITY): Payer: Self-pay | Admitting: Emergency Medicine

## 2015-08-04 ENCOUNTER — Emergency Department (HOSPITAL_COMMUNITY): Payer: Medicare Other

## 2015-08-04 ENCOUNTER — Emergency Department (HOSPITAL_COMMUNITY)
Admission: EM | Admit: 2015-08-04 | Discharge: 2015-08-04 | Disposition: A | Discharge status: Home / Self Care | Payer: Medicare Other | Attending: Emergency Medicine | Admitting: Emergency Medicine

## 2015-08-04 DIAGNOSIS — S8001XA Contusion of right knee, initial encounter: Secondary | ICD-10-CM

## 2015-08-04 DIAGNOSIS — M797 Fibromyalgia: Secondary | ICD-10-CM | POA: Diagnosis not present

## 2015-08-04 DIAGNOSIS — J45909 Unspecified asthma, uncomplicated: Secondary | ICD-10-CM | POA: Diagnosis not present

## 2015-08-04 DIAGNOSIS — Z79899 Other long term (current) drug therapy: Secondary | ICD-10-CM | POA: Diagnosis not present

## 2015-08-04 DIAGNOSIS — S79911A Unspecified injury of right hip, initial encounter: Secondary | ICD-10-CM | POA: Diagnosis not present

## 2015-08-04 DIAGNOSIS — R55 Syncope and collapse: Secondary | ICD-10-CM | POA: Diagnosis not present

## 2015-08-04 DIAGNOSIS — M5430 Sciatica, unspecified side: Secondary | ICD-10-CM | POA: Diagnosis not present

## 2015-08-04 DIAGNOSIS — Y998 Other external cause status: Secondary | ICD-10-CM | POA: Diagnosis not present

## 2015-08-04 DIAGNOSIS — K219 Gastro-esophageal reflux disease without esophagitis: Secondary | ICD-10-CM | POA: Insufficient documentation

## 2015-08-04 DIAGNOSIS — Y9289 Other specified places as the place of occurrence of the external cause: Secondary | ICD-10-CM | POA: Insufficient documentation

## 2015-08-04 DIAGNOSIS — S8991XA Unspecified injury of right lower leg, initial encounter: Secondary | ICD-10-CM | POA: Diagnosis present

## 2015-08-04 DIAGNOSIS — G8929 Other chronic pain: Secondary | ICD-10-CM | POA: Insufficient documentation

## 2015-08-04 DIAGNOSIS — W01198A Fall on same level from slipping, tripping and stumbling with subsequent striking against other object, initial encounter: Secondary | ICD-10-CM | POA: Insufficient documentation

## 2015-08-04 DIAGNOSIS — Z8639 Personal history of other endocrine, nutritional and metabolic disease: Secondary | ICD-10-CM | POA: Diagnosis not present

## 2015-08-04 DIAGNOSIS — F329 Major depressive disorder, single episode, unspecified: Secondary | ICD-10-CM | POA: Diagnosis not present

## 2015-08-04 DIAGNOSIS — M25551 Pain in right hip: Secondary | ICD-10-CM

## 2015-08-04 DIAGNOSIS — S3992XA Unspecified injury of lower back, initial encounter: Secondary | ICD-10-CM | POA: Insufficient documentation

## 2015-08-04 DIAGNOSIS — Y9389 Activity, other specified: Secondary | ICD-10-CM | POA: Diagnosis not present

## 2015-08-04 DIAGNOSIS — W19XXXA Unspecified fall, initial encounter: Secondary | ICD-10-CM

## 2015-08-04 LAB — CBC
HCT: 39.1 % (ref 36.0–46.0)
Hemoglobin: 13 g/dL (ref 12.0–15.0)
MCH: 28.4 pg (ref 26.0–34.0)
MCHC: 33.2 g/dL (ref 30.0–36.0)
MCV: 85.6 fL (ref 78.0–100.0)
Platelets: 271 10*3/uL (ref 150–400)
RBC: 4.57 MIL/uL (ref 3.87–5.11)
RDW: 13.8 % (ref 11.5–15.5)
WBC: 9.5 10*3/uL (ref 4.0–10.5)

## 2015-08-04 LAB — BASIC METABOLIC PANEL
Anion gap: 9 (ref 5–15)
BUN: 15 mg/dL (ref 6–20)
CO2: 24 mmol/L (ref 22–32)
Calcium: 9.1 mg/dL (ref 8.9–10.3)
Chloride: 106 mmol/L (ref 101–111)
Creatinine, Ser: 0.73 mg/dL (ref 0.44–1.00)
GFR calc Af Amer: >60 mL/min (ref >60)
GFR calc non Af Amer: >60 mL/min (ref >60)
Glucose, Bld: 83 mg/dL (ref 65–99)
Potassium: 4.5 mmol/L (ref 3.5–5.1)
Sodium: 139 mmol/L (ref 135–145)

## 2015-08-04 NOTE — Discharge Instructions (Signed)

## 2015-08-04 NOTE — ED Notes (Signed)
PT TOOK HER OWN PAIN MEDICATION, PERCOCET 5/325MG . DR. PICKERING AWARE.

## 2015-08-04 NOTE — ED Provider Notes (Signed)
CSN: [id number]     Arrival date & time 08/04/15  1456 History   First MD Initiated Contact with Patient 08/04/15 2034     Chief Complaint  Patient presents with  . Near Syncope      Patient is a 50 y.o. female presenting with near-syncope. The history is provided by the patient.  Near Syncope This is a recurrent problem. Pertinent negatives include no chest pain, no abdominal pain and no shortness of breath.  patient presents with near-syncope. History of same. History of vasovagal syncope and states she's been worked up for the past. States she fell and hit her right hip on the footboard of her bed and then landed onto her knee on the ground. Complaining of pain in her right hip right knee. States she's been able to ambulate to the pain.No otherinjury. He does have some chronic back pain. States she took pain medicine for the back pain and did not help very much on her leg. Also history of fibromyalgia.  Past Medical History  Diagnosis Date  . Asthma   . GERD (gastroesophageal reflux disease)   . Grave's disease   . Fibromyalgia   . Environmental allergies   . Depression   . Sciatica   . Syncope and collapse   . Vasovagal syncope    Past Surgical History  Procedure Laterality Date  . Abdominal hysterectomy      partial  . Gastric bypass  2008   Family History  Problem Relation Age of Onset  . Hypertension Mother   . Cirrhosis Mother   . Alcohol abuse Mother   . Depression Mother   . Physical abuse Mother   . Hypertension Sister   . Alcohol abuse Sister   . Drug abuse Sister   . Depression Sister   . Anxiety disorder Sister   . OCD Sister   . Diabetes Other   . Hypertension Other   . Diabetes Maternal Uncle   . Hypertension Father   . Seizures Cousin   . ADD / ADHD Other    Social History  Substance Use Topics  . Smoking status: Never Smoker   . Smokeless tobacco: Never Used  . Alcohol Use: 1.2 oz/week    1 Glasses of wine, 1 Shots of liquor, 0 Standard  drinks or equivalent per week     Comment: 1-2x/week   OB History    Gravida Para Term Preterm AB TAB SAB Ectopic Multiple Living   2 2        2      Review of Systems  Constitutional: Negative for appetite change.  Respiratory: Negative for shortness of breath.   Cardiovascular: Positive for near-syncope. Negative for chest pain.  Gastrointestinal: Negative for abdominal pain.  Musculoskeletal: Positive for back pain.       Rright knee and right hip pain.  Skin: Negative for wound.  Neurological: Positive for syncope.  Hematological: Does not bruise/bleed easily.  Psychiatric/Behavioral: Negative for confusion.      Allergies  Bee venom; Coconut oil; Mushroom ext cmplx(shiitake-reishi-mait); Nutritional supplements; Other; Shellfish allergy; Strawberry extract; and Hydrocodone-acetaminophen  Home Medications   Prior to Admission medications   Medication Sig Start Date End Date Taking? Authorizing Provider  albuterol (PROVENTIL HFA;VENTOLIN HFA) 108 (90 BASE) MCG/ACT inhaler Inhale 2 puffs into the lungs every 6 (six) hours as needed for wheezing.   Yes Historical Provider, MD  amitriptyline (ELAVIL) 50 MG tablet Take 1 tablet (50 mg total) by mouth at bedtime. 07/22/15  Yes  Oletta Darter, MD  azelastine (ASTELIN) 137 MCG/SPRAY nasal spray Place 1 spray into the nose 2 (two) times daily. Use in each nostril as directed   Yes Historical Provider, MD  BIOTIN 5000 PO Take 5,000 Units by mouth daily.    Yes Historical Provider, MD  cetirizine (ZYRTEC) 10 MG tablet Take 10 mg by mouth daily.   Yes Historical Provider, MD  cholecalciferol (VITAMIN D) 1000 UNITS tablet Take 1,000 Units by mouth daily.    Yes Historical Provider, MD  cyclobenzaprine (FLEXERIL) 10 MG tablet Take 1 tablet (10 mg total) by mouth 2 (two) times daily as needed for muscle spasms. 02/23/14  Yes Renne Crigler, PA-C  diphenhydrAMINE (BENADRYL) 25 MG tablet Take 50 mg by mouth at bedtime as needed for itching or  sleep.   Yes Historical Provider, MD  Epinastine HCl (ELESTAT) 0.05 % ophthalmic solution Place 1 drop into both eyes 2 (two) times daily as needed (itching).    Yes Historical Provider, MD  EPINEPHrine (EPIPEN) 0.3 mg/0.3 mL DEVI Inject 0.3 mg into the muscle as needed. For allergic reaction to stinging insects, mushrooms, and shellfish    Yes Historical Provider, MD  estradiol (ESTRACE) 0.5 MG tablet Take 0.5 mg by mouth daily.   Yes Historical Provider, MD  hydrOXYzine (VISTARIL) 50 MG capsule Take 1 capsule (50 mg total) by mouth at bedtime. 07/22/15  Yes Oletta Darter, MD  ibuprofen (ADVIL,MOTRIN) 800 MG tablet Take 800 mg by mouth every 8 (eight) hours as needed for moderate pain.   Yes Historical Provider, MD  lithium carbonate (LITHOBID) 300 MG CR tablet Take 1 tablet (300 mg total) by mouth 2 (two) times daily. 07/22/15 07/21/16 Yes Oletta Darter, MD  meclizine (ANTIVERT) 25 MG tablet Take 1 tablet (25 mg total) by mouth every 6 (six) hours as needed for dizziness. 10/12/13  Yes Purvis Sheffield, MD  Menthol-Methyl Salicylate (BEN GAY GREASELESS) 10-15 % greaseless cream Apply 1 application topically 3 (three) times daily as needed for pain.   Yes Historical Provider, MD  mometasone-formoterol (DULERA) 100-5 MCG/ACT AERO Inhale 2 puffs into the lungs 2 (two) times daily as needed for wheezing or shortness of breath.    Yes Historical Provider, MD  montelukast (SINGULAIR) 10 MG tablet Take 1 tablet (10 mg total) by mouth at bedtime. 02/13/15  Yes Danelle Berry, PA-C  Multiple Vitamins-Minerals (MULTIVITAMIN WITH MINERALS) tablet Take 1 tablet by mouth daily.   Yes Historical Provider, MD  oxyCODONE-acetaminophen (PERCOCET/ROXICET) 5-325 MG per tablet Take one every 6 hours if needed for cough 06/12/14  Yes Peyton Najjar, MD  oxymetazoline (AFRIN NASAL SPRAY) 0.05 % nasal spray Place 1 spray into both nostrils 2 (two) times daily. 07/07/15  Yes Satira Sark Nadeau, PA-C  ranitidine (ZANTAC) 150 MG  tablet Take 150 mg by mouth daily.   Yes Historical Provider, MD  benzonatate (TESSALON) 100 MG capsule Take 2 capsules (200 mg total) by mouth 2 (two) times daily as needed for cough. Patient not taking: Reported on 08/04/2015 07/07/15   Barrett Henle, PA-C  Guaifenesin-Codeine 200-20 MG/5ML LIQD Take 5 ml PO BID prn cough Patient not taking: Reported on 03/25/2015 05/30/14   Thao P Le, DO   BP 110/75 mmHg  Pulse 60  Temp(Src) 98 F (36.7 C) (Oral)  Resp 17  SpO2 100% Physical Exam  Constitutional: She appears well-developed.  Cardiovascular: Normal rate.   Pulmonary/Chest: Effort normal.  Abdominal: There is no tenderness.  Musculoskeletal: She exhibits tenderness.  Tenderness over  right hip and right knee. Decreased range of motionin both due to pain. neurovascularly Intact right foot.Also some lower lumbar tenderness, worse on the right side.  Skin: Skin is warm.    ED Course  Procedures (including critical care time) Labs Review Labs Reviewed  BASIC METABOLIC PANEL  CBC    Imaging Review Dg Knee Complete 4 Views Right  08/04/2015  CLINICAL DATA:  Syncope with fall today, right knee pain. EXAM: RIGHT KNEE - COMPLETE 4+ VIEW COMPARISON:  None. FINDINGS: There is no evidence of fracture, dislocation, or joint effusion. There is no evidence of arthropathy or other focal bone abnormality. Soft tissues are unremarkable. IMPRESSION: Negative. Electronically Signed   By: Bary Richard M.D.   On: 08/04/2015 20:44   Dg Hip Unilat With Pelvis 2-3 Views Right  08/04/2015  CLINICAL DATA:  Status post syncope and fall. Right hip pain. Initial encounter. EXAM: DG HIP (WITH OR WITHOUT PELVIS) 2-3V RIGHT COMPARISON:  None. FINDINGS: There is no evidence of fracture or dislocation. Both femoral heads are seated normally within their respective acetabula. The proximal right femur appears intact. No significant degenerative change is appreciated. The sacroiliac joints are unremarkable in  appearance. The visualized bowel gas pattern is grossly unremarkable in appearance. IMPRESSION: No evidence of fracture or dislocation. Electronically Signed   By: Roanna Raider M.D.   On: 08/04/2015 20:48   I have personally reviewed and evaluated these images and lab results as part of my medical decision-making.   EKG Interpretation None      MDM   Final diagnoses:  Fall, initial encounter  Hip pain, right  Knee contusion, right, initial encounter    Patient with fall. Right hip and knee pain. Negative x-rays. Will discharge home. She has pain medicines at home.    Benjiman Core, MD 08/05/15 0040

## 2015-08-04 NOTE — ED Notes (Signed)
PT DISCHARGED. INSTRUCTIONS GIVEN. AAOX3. PT IN NO APPARENT DISTRESS. THE OPPORTUNITY TO ASK QUESTIONS WAS PROVIDED. 

## 2015-08-04 NOTE — ED Notes (Signed)
Pt c/o rt hip and rt knee pain after syncopal episode; pt states that she is here to see about her leg; pt states that she has syncopal episodes and that is not new and is not her for treatment of her syncope but of her leg pain

## 2015-08-04 NOTE — ED Notes (Signed)
Patient presents for two syncopal episodes today. C/o right hip pain, tender with palpation. Denies hitting head, no anticoagulants.

## 2015-08-14 ENCOUNTER — Ambulatory Visit (HOSPITAL_COMMUNITY): Payer: Self-pay | Admitting: Psychiatry

## 2015-08-14 ENCOUNTER — Ambulatory Visit (HOSPITAL_COMMUNITY): Payer: Self-pay | Admitting: Clinical

## 2015-08-19 ENCOUNTER — Ambulatory Visit (HOSPITAL_COMMUNITY): Payer: Self-pay | Admitting: Psychiatry

## 2015-08-26 ENCOUNTER — Encounter: Payer: Self-pay | Admitting: Dietician

## 2015-08-26 ENCOUNTER — Encounter: Payer: Medicare Other | Attending: Internal Medicine | Admitting: Dietician

## 2015-08-26 DIAGNOSIS — Z9884 Bariatric surgery status: Secondary | ICD-10-CM | POA: Diagnosis not present

## 2015-08-26 DIAGNOSIS — R634 Abnormal weight loss: Secondary | ICD-10-CM | POA: Insufficient documentation

## 2015-08-26 NOTE — Patient Instructions (Addendum)
-  Go to your therapist/counselor appointment next week -Take medications as prescribed -Talk to your sister about setting a walking date (Mondays, Tuesdays, or in the evenings after work)  -Increase protein foods throughout the day: EAS protein shake, Premier shake, spoonful of peanut butter, eggs, chicken, cheese, deli ham, bacon, pork rinds -Keep convenient protein foods on hand -Aim to eat something with protein in it about every 3 hours you're awake -Keep working on keeping trigger foods out of the house -Pre portion snacks like pork rinds and nuts -Start taking Citracal Petites with your vitamin D (do not take Calcium with your multivitamin)

## 2015-08-26 NOTE — Progress Notes (Signed)
  Follow-up visit:  7.5 years Post-Operative RYGB Surgery  Medical Nutrition Therapy:  Appt start time: 320 end time:  400  Primary concerns today: Post-operative Bariatric Surgery Nutrition Management. [name] states that she is here to discuss weight gain. She had RYGB in August 2009 and reports she reached her lowest weight of 173 lbs and maintained this for years. Per patient, she has gained 50 pounds in the last year. Had an endoscopy in March of last year and states that 4 staples were removed. She notes that her weight gain began after the endoscopy. Still having chest pain related to acid reflux. Started Lithium at the end of March.  Before her surgery she admits that she abused laxatives, cut herself, and made herself vomit. Recently made herself vomit on 2 occasions and started using laxatives but does not cut herself. She has a psychiatrist for med management but does not have a Veterinary surgeon. Has an appointment with a new therapist next week. [name] confirms that she is not thinking of hurting herself. She lives alone. She is depressed and sometimes may not get dressed or leave the house for days.   Does not tolerate greasy or fried foods.  Avoids a lot of red meat. Does not like liver, yogurt, or [place].  Preferred Learning Style:   No preference indicated   Learning Readiness:   Ready  24-hr recall: Wakes up at 10-11am B (10-11AM): banana Snk (AM):   L (PM):  Snk (PM):   D (4-5PM): salad or stir fried vegetables or grilled chicken or veggie sub Snk (PM): popcorn, fruit, chips, protein bars  Goes to sleep at 2-3 am  Fluid intake: water, 1/2 sweet tea, some soda Estimated total protein intake: insufficient  Medications: see list Supplementation: taking multivitamin and vitamin D  Recent physical activity:  Walking occasionally with her sister  Progress Towards Goal(s):  In progress.   Nutritional Diagnosis:  -3.4 Unintentional weight gain As related to erratic meal  pattern, physical inactivity, and excessive carbohydrate intake.  As evidenced by patient reports 50 lbs weight regain in the past year 7.5 years post op RYGB.    Intervention:  Nutrition counseling provided. Goals: -Go to your therapist/counselor appointment next week -Take medications as prescribed -Talk to your sister about setting a walking date (Mondays, Tuesdays, or in the evenings after work) -Increase protein foods throughout the day: EAS protein shake, Premier shake, spoonful of peanut butter, eggs, chicken, cheese, deli ham, bacon, pork rinds -Keep convenient protein foods on hand -Aim to eat something with protein in it about every 3 hours you're awake -Keep working on keeping trigger foods out of the house -Pre portion snacks like pork rinds and nuts -Start taking Citracal Petites with your vitamin D (do not take Calcium with your multivitamin)  Teaching Method Utilized:  Auditory  Barriers to learning/adherence to lifestyle change: depression   Demonstrated degree of understanding via:  Teach Back   Monitoring/Evaluation:  Dietary intake, exercise, and body weight. Follow up in 2 months.

## 2015-08-27 ENCOUNTER — Other Ambulatory Visit (HOSPITAL_COMMUNITY): Payer: Self-pay | Admitting: Psychiatry

## 2015-09-02 DIAGNOSIS — Z9181 History of falling: Secondary | ICD-10-CM | POA: Insufficient documentation

## 2015-09-04 ENCOUNTER — Ambulatory Visit (INDEPENDENT_AMBULATORY_CARE_PROVIDER_SITE_OTHER): Payer: 59 | Admitting: Psychiatry

## 2015-09-04 ENCOUNTER — Encounter (HOSPITAL_COMMUNITY): Payer: Self-pay | Admitting: Psychiatry

## 2015-09-04 VITALS — BP 125/85 | HR 85 | Wt 225.0 lb

## 2015-09-04 DIAGNOSIS — F4010 Social phobia, unspecified: Secondary | ICD-10-CM | POA: Diagnosis not present

## 2015-09-04 DIAGNOSIS — F4001 Agoraphobia with panic disorder: Secondary | ICD-10-CM | POA: Diagnosis not present

## 2015-09-04 DIAGNOSIS — F411 Generalized anxiety disorder: Secondary | ICD-10-CM | POA: Diagnosis not present

## 2015-09-04 DIAGNOSIS — G4700 Insomnia, unspecified: Secondary | ICD-10-CM

## 2015-09-04 DIAGNOSIS — F3181 Bipolar II disorder: Secondary | ICD-10-CM | POA: Diagnosis not present

## 2015-09-04 MED ORDER — AMITRIPTYLINE HCL 75 MG PO TABS: 75.0000 mg | ORAL_TABLET | Freq: Every day | ORAL | Status: DC

## 2015-09-04 MED ORDER — LAMOTRIGINE 25 MG PO TABS: ORAL_TABLET | ORAL | Status: DC
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
## 2015-09-10 ENCOUNTER — Encounter (HOSPITAL_COMMUNITY): Payer: Self-pay | Admitting: Clinical

## 2015-09-10 ENCOUNTER — Ambulatory Visit (INDEPENDENT_AMBULATORY_CARE_PROVIDER_SITE_OTHER): Payer: 59 | Admitting: Clinical

## 2015-09-10 ENCOUNTER — Telehealth (HOSPITAL_COMMUNITY): Payer: Self-pay

## 2015-09-10 DIAGNOSIS — F411 Generalized anxiety disorder: Secondary | ICD-10-CM

## 2015-09-10 DIAGNOSIS — F3110 Bipolar disorder, current episode manic without psychotic features, unspecified: Secondary | ICD-10-CM

## 2015-09-10 DIAGNOSIS — R443 Hallucinations, unspecified: Secondary | ICD-10-CM

## 2015-09-10 NOTE — Telephone Encounter (Signed)
Face-to-face with patient as she came in to verify her current medications.  Patient had old bottles of Tegretol, Seroquel and Hydroxyzine.  Verified through chart review all these had been discontinued by Dr. Michae Kava and patient should be taking Lamictal 25 mg, one for two weeks and then increase to two and Elavil 75 mg, one at bedtime.  Patient reported she was having increased problems with sleep and had not filled Elavil yet.  Patient agreed to pick up this medication and to begin taking today and to call back if any continued problems with sleep as patient states understanding for what she is currently taking.  Patient reports she has started Lamictal 25 mg, one a day and was not experiencing any rashes or other potential side effects to the medication.  Agreed to send note to Dr. Michae Kava to make her aware patient came by to verify her current medications.

## 2015-09-16 ENCOUNTER — Encounter (HOSPITAL_COMMUNITY): Payer: Self-pay | Admitting: Family Medicine

## 2015-09-16 ENCOUNTER — Emergency Department (HOSPITAL_COMMUNITY)
Admission: EM | Admit: 2015-09-16 | Discharge: 2015-09-16 | Disposition: A | Discharge status: Home / Self Care | Payer: Medicare Other | Attending: Emergency Medicine | Admitting: Emergency Medicine

## 2015-09-16 ENCOUNTER — Emergency Department (HOSPITAL_COMMUNITY): Payer: Medicare Other

## 2015-09-16 DIAGNOSIS — J45901 Unspecified asthma with (acute) exacerbation: Secondary | ICD-10-CM | POA: Insufficient documentation

## 2015-09-16 DIAGNOSIS — R0602 Shortness of breath: Secondary | ICD-10-CM | POA: Diagnosis present

## 2015-09-16 LAB — CBC
HCT: 38.2 % (ref 36.0–46.0)
Hemoglobin: 12.2 g/dL (ref 12.0–15.0)
MCH: 28 pg (ref 26.0–34.0)
MCHC: 31.9 g/dL (ref 30.0–36.0)
MCV: 87.6 fL (ref 78.0–100.0)
Platelets: 294 10*3/uL (ref 150–400)
RBC: 4.36 MIL/uL (ref 3.87–5.11)
RDW: 13.4 % (ref 11.5–15.5)
WBC: 6.9 10*3/uL (ref 4.0–10.5)

## 2015-09-16 LAB — BASIC METABOLIC PANEL
Anion gap: 10 (ref 5–15)
BUN: 12 mg/dL (ref 6–20)
CO2: 26 mmol/L (ref 22–32)
Calcium: 9.4 mg/dL (ref 8.9–10.3)
Chloride: 104 mmol/L (ref 101–111)
Creatinine, Ser: 0.81 mg/dL (ref 0.44–1.00)
GFR calc Af Amer: >60 mL/min (ref >60)
GFR calc non Af Amer: >60 mL/min (ref >60)
Glucose, Bld: 76 mg/dL (ref 65–99)
Potassium: 4.4 mmol/L (ref 3.5–5.1)
Sodium: 140 mmol/L (ref 135–145)

## 2015-09-16 LAB — I-STAT TROPONIN, ED: Troponin i, poc: 0.01 ng/mL (ref 0.00–0.08)

## 2015-09-16 LAB — D-DIMER, QUANTITATIVE: D-Dimer, Quant: <0.27 ug/mL-FEU (ref 0.00–0.50)

## 2015-09-16 NOTE — ED Notes (Addendum)
Pt here for chest pain with breathing and she feels like she cant get a deep breath. sts SOB. PCP sent her here r/o PE
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
## 2015-10-01 ENCOUNTER — Other Ambulatory Visit (HOSPITAL_COMMUNITY): Payer: Self-pay | Admitting: Psychiatry

## 2015-10-02 ENCOUNTER — Ambulatory Visit (HOSPITAL_COMMUNITY): Payer: Self-pay | Admitting: Clinical

## 2015-10-18 ENCOUNTER — Other Ambulatory Visit (HOSPITAL_COMMUNITY): Payer: Self-pay | Admitting: Psychiatry

## 2015-10-21 ENCOUNTER — Ambulatory Visit (INDEPENDENT_AMBULATORY_CARE_PROVIDER_SITE_OTHER): Payer: 59 | Admitting: Psychiatry

## 2015-10-21 ENCOUNTER — Encounter (HOSPITAL_COMMUNITY): Payer: Self-pay | Admitting: Psychiatry

## 2015-10-21 VITALS — BP 130/88 | HR 90 | Ht 65.0 in | Wt 237.0 lb

## 2015-10-21 DIAGNOSIS — F4001 Agoraphobia with panic disorder: Secondary | ICD-10-CM | POA: Diagnosis not present

## 2015-10-21 DIAGNOSIS — F4010 Social phobia, unspecified: Secondary | ICD-10-CM

## 2015-10-21 DIAGNOSIS — G4700 Insomnia, unspecified: Secondary | ICD-10-CM

## 2015-10-21 DIAGNOSIS — F3181 Bipolar II disorder: Secondary | ICD-10-CM

## 2015-10-21 MED ORDER — LAMOTRIGINE 100 MG PO TABS: ORAL_TABLET | ORAL | Status: DC

## 2015-10-21 MED ORDER — LAMOTRIGINE 25 MG PO TABS: ORAL_TABLET | ORAL | Status: DC

## 2015-10-21 MED ORDER — TRAZODONE HCL 50 MG PO TABS: 50.0000 mg | ORAL_TABLET | Freq: Every evening | ORAL | Status: DC | PRN
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
## 2015-10-28 ENCOUNTER — Encounter: Payer: Medicare Other | Attending: Internal Medicine | Admitting: Dietician

## 2015-10-28 DIAGNOSIS — R634 Abnormal weight loss: Secondary | ICD-10-CM | POA: Insufficient documentation

## 2015-10-28 DIAGNOSIS — Z713 Dietary counseling and surveillance: Secondary | ICD-10-CM | POA: Diagnosis not present

## 2015-10-28 DIAGNOSIS — Z9884 Bariatric surgery status: Secondary | ICD-10-CM | POA: Insufficient documentation

## 2015-10-28 NOTE — Progress Notes (Signed)
  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 [name] returns for a nutrition follow up. She declines an updated weight in our office today. She reports a lot of stress at home and feels like she cannot remember important dates. Today she does not remember our office or meeting with me 2 months ago. Has a very flat affect and slow speech. She reports that she used five Ex-Lax last night but states she has not harmed herself since before Christmas. She has addressed laxative abuse with her therapist.   Crisis number provided at today's appointment although patient is not actively planning to harm herself.   Patient has an appointment with Denyce Robert, [facility] (therapist at [facility]) on November 26, 2015.   Does not tolerate greasy or fried foods.  Avoids a lot of red meat. Does not like liver, yogurt, or [place].  Preferred Learning Style:   No preference indicated   Learning Readiness:   Ready  24-hr recall: Wakes up at 10-11am B (10-11AM): banana Snk (AM):   L (PM):  Snk (PM):   D (4-5PM): salad or stir fried vegetables or grilled chicken or veggie sub Snk (PM): popcorn, fruit, chips, protein bars  Goes to sleep at 2-3 am  Fluid intake: water, 1/2 sweet tea, some soda Estimated total protein intake: insufficient  Medications: see list Supplementation: taking multivitamin and vitamin D  Recent physical activity:  Walking occasionally with her sister  Progress Towards Goal(s):  In progress.   Nutritional Diagnosis:  [place]-3.4 Unintentional weight gain As related to erratic meal pattern, physical inactivity, and excessive carbohydrate intake.  As evidenced by patient reports 50 lbs weight regain in the past year 7.5 years post op RYGB.    Intervention:  Emphasized importance of self care. Encouraged patient to  follow up with mental health provider.  Teaching Method Utilized:  Auditory  Barriers to learning/adherence to lifestyle change: depression   Demonstrated degree of understanding via:  Teach Back   Monitoring/Evaluation:  Dietary intake, exercise, and body weight. Follow up in 2 months.

## 2015-10-28 NOTE — Patient Instructions (Addendum)
-  Go to your therapist/counselor appointment next week -Take medications as prescribed -Talk to your sister about setting a walking date (Mondays, Tuesdays, or in the evenings after work)  -Increase protein foods throughout the day: EAS protein shake, Premier shake, spoonful of peanut butter, eggs, chicken, cheese, deli ham, bacon, pork rinds -Keep convenient protein foods on hand -Aim to eat something with protein in it about every 3 hours you're awake -Keep working on keeping trigger foods out of the house -Pre portion snacks like pork rinds and nuts -Start taking Citracal Petites with your vitamin D (do not take Calcium with your multivitamin)  *Work on taking care of yourself!!  -Get some painting supplies next time you are at [facility]  -In the meantime, get back into coloring  -Begin seeing therapist [name]) as often as possible!!

## 2015-10-29 ENCOUNTER — Encounter: Payer: Self-pay | Admitting: Dietician

## 2015-11-25 ENCOUNTER — Other Ambulatory Visit (HOSPITAL_COMMUNITY): Payer: Self-pay | Admitting: Psychiatry

## 2015-11-26 ENCOUNTER — Telehealth (HOSPITAL_COMMUNITY): Payer: Self-pay

## 2015-11-26 DIAGNOSIS — G4700 Insomnia, unspecified: Secondary | ICD-10-CM

## 2015-11-26 NOTE — Telephone Encounter (Signed)
Patient is calling because she would like to increase her Trazodone, she states that she is only sleeping about 3 hours a night and would like to go back to 100 mg that her PCP had her on previously. Please review and advise, thank you

## 2015-11-27 MED ORDER — TRAZODONE HCL 50 MG PO TABS: 100.0000 mg | ORAL_TABLET | Freq: Every evening | ORAL | Status: DC | PRN

## 2015-11-27 NOTE — Telephone Encounter (Signed)
Increase to Trazodone 100mg  po qHS prn insomnia

## 2015-11-27 NOTE — Telephone Encounter (Signed)
Sent new prescription to the pharmacy and called the patient to let her know

## 2015-11-28 ENCOUNTER — Other Ambulatory Visit (HOSPITAL_COMMUNITY): Payer: Self-pay | Admitting: Psychiatry

## 2015-12-02 ENCOUNTER — Telehealth: Payer: Self-pay | Admitting: *Deleted

## 2015-12-02 NOTE — Telephone Encounter (Signed)
Noreene Larsson called from [facility] in [place] asking for mammogram scholarship program number to relay to patient to schedule. I called pt directly and told her to call me to get the #. Which is [phone]

## 2015-12-02 NOTE — Telephone Encounter (Signed)
I spoke with patient and she had her mammogram done at [facility] this year. Patient said Noreene Larsson wanted to know where pt last mammogram was done so they can compare studies. I called Noreene Larsson at [phone] and left a message for her to call me ot discuss this.

## 2015-12-03 NOTE — Telephone Encounter (Signed)
I spoke with [name] and she needs the imaging from recent mammogram. I gave her the # to the [facility] center of [place] to compare mammograms

## 2015-12-09 ENCOUNTER — Other Ambulatory Visit (HOSPITAL_COMMUNITY): Payer: Self-pay | Admitting: Psychiatry

## 2015-12-24 ENCOUNTER — Ambulatory Visit: Payer: Self-pay | Admitting: Dietician

## 2015-12-25 ENCOUNTER — Encounter (HOSPITAL_COMMUNITY): Payer: Self-pay | Admitting: Psychiatry

## 2015-12-25 ENCOUNTER — Ambulatory Visit (INDEPENDENT_AMBULATORY_CARE_PROVIDER_SITE_OTHER): Payer: 59 | Admitting: Psychiatry

## 2015-12-25 VITALS — BP 132/86 | HR 87 | Ht 66.0 in | Wt 221.8 lb

## 2015-12-25 DIAGNOSIS — F410 Panic disorder [episodic paroxysmal anxiety] without agoraphobia: Secondary | ICD-10-CM

## 2015-12-25 DIAGNOSIS — F411 Generalized anxiety disorder: Secondary | ICD-10-CM

## 2015-12-25 DIAGNOSIS — G4700 Insomnia, unspecified: Secondary | ICD-10-CM | POA: Diagnosis not present

## 2015-12-25 DIAGNOSIS — F4010 Social phobia, unspecified: Secondary | ICD-10-CM

## 2015-12-25 DIAGNOSIS — F3181 Bipolar II disorder: Secondary | ICD-10-CM | POA: Diagnosis not present

## 2015-12-25 MED ORDER — LAMOTRIGINE 150 MG PO TABS: ORAL_TABLET | ORAL | 3 refills | Status: DC

## 2015-12-25 MED ORDER — TRAZODONE HCL 150 MG PO TABS: 150.0000 mg | ORAL_TABLET | Freq: Every evening | ORAL | 3 refills | Status: DC | PRN

## 2015-12-25 MED ORDER — HYDROXYZINE PAMOATE 25 MG PO CAPS: 25.0000 mg | ORAL_CAPSULE | Freq: Every day | ORAL | 0 refills | Status: DC | PRN
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
## 2016-01-12 ENCOUNTER — Other Ambulatory Visit (HOSPITAL_COMMUNITY): Payer: Self-pay | Admitting: Psychiatry

## 2016-01-12 DIAGNOSIS — F4010 Social phobia, unspecified: Secondary | ICD-10-CM

## 2016-01-12 DIAGNOSIS — F410 Panic disorder [episodic paroxysmal anxiety] without agoraphobia: Secondary | ICD-10-CM

## 2016-01-13 ENCOUNTER — Ambulatory Visit (INDEPENDENT_AMBULATORY_CARE_PROVIDER_SITE_OTHER): Payer: Medicare Other | Admitting: Neurology

## 2016-01-13 ENCOUNTER — Encounter: Payer: Self-pay | Admitting: Neurology

## 2016-01-13 VITALS — BP 105/70 | HR 77 | Ht 65.0 in | Wt 227.0 lb

## 2016-01-13 DIAGNOSIS — G43011 Migraine without aura, intractable, with status migrainosus: Secondary | ICD-10-CM

## 2016-01-13 MED ORDER — SUMATRIPTAN SUCCINATE 100 MG PO TABS: 100.0000 mg | ORAL_TABLET | Freq: Once | ORAL | 2 refills | Status: DC | PRN

## 2016-01-13 MED ORDER — TOPIRAMATE 50 MG PO TABS: ORAL_TABLET | ORAL | 6 refills | Status: DC

## 2016-01-13 NOTE — Progress Notes (Signed)
[facility]    Provider:  Dr Lucia Gaskins Referring Provider: Iona Hansen, NP Primary Care Physician:  Iona Hansen, NP  CC:  Intractable migraines  HPI:  [name] is a 50 y.o. female here as a referral from Dr. Yetta Barre for migraines. Past medical history of hypertension, high cholesterol, chronic pain, migraine, depression, anxiety, fibromyalgia, vertigo syncope, sciatica, bipolar, morbid obesity, severe depressed bipolar 2 disorder. She has migraines daily, nothing makes them better. She had migraines for 19 days straight in July. Thought it was a sinus infection. She was placed on Topiramate many years ago for migraines and she continues to take it for 13-14s year, no inciting events or head trauma in the past, no family history of migraines. She is taking 100 mg in the morning and 100 mg at night of Topamax.  Migraines are pressure on the left, pounding, throbbing, light sensitivity, sound sensitivity, every day. She has to wear her shades inside the house which makes her feel better. She takes oxycodone prescribed for her sciatica and joint pain every 3-4 days for the migraines. Discussed rebound headaches. No aura. No vision changes, no dysarthria, no dysphasia, no meningismus. These are the same migraines that she's been having for many years, no change in severity or frequency. Stress makes them worse and she has significant depression. Smells trigger her migraines. Migraines can be severe.  Reviewed notes, labs and imaging from outside physicians, which showed:   Reviewed images of MRI brain personally on CD 12/25/2015 and agree with the following: FINDINGS: No evidence for acute infarction, hemorrhage, mass lesion, hydrocephalus, or extra-axial fluid. Normal for age cerebral volume. Minor subcortical white matter disease on the LEFT, 3-4 foci of T2 and FLAIR hyperintensity, subcentimeter size frontal region. No similar findings on the RIGHT or in the periventricular  white matter. Flow voids are maintained. Artifactual gradient sequence, but no definite foci of chronic hemorrhage. The extracranial soft tissues are unremarkable.  CBC and BMP were normal in April 2017 including creatinine of 0.81.  Review of Systems: Patient complains of symptoms per HPI as well as the following symptoms: Weight gain, fatigue, blurred vision, chest pain, palpitations, shortness of breath, wheezing, feeling hot, feeling cold, increased thirst, flushing, joint pain, cramps, aching muscles, allergies, runny nose, skin sensitivity, frequent infections, memory loss, confusion, headache, numbness, weakness, insomnia, sleepiness, restless, dizziness, passing out, depression, anxiety, not enough sleep, decreased energy, change in appetite, disinterest in activities, suicidal thoughts, racing thoughts. Pertinent negatives per HPI. All others negative.   Social History   Social History  . Marital status: Divorced    Spouse name: N/A  . Number of children: N/A  . Years of education: N/A   Occupational History  . Not on file.   Social History Main Topics  . Smoking status: Never Smoker  . Smokeless tobacco: Never Used  . Alcohol use 1.2 oz/week    1 Glasses of wine, 1 Shots of liquor per week     Comment: 1-2x/week  . Drug use: No  . Sexual activity: Yes    Birth control/ protection: Surgical   Other Topics Concern  . Not on file   Social History Narrative   Lives with son   Caffeine use: sometimes per patient    Family History  Problem Relation Age of Onset  . Hypertension Mother   . Cirrhosis Mother   . Alcohol abuse Mother   . Depression Mother   . Physical abuse Mother   . Hypertension Sister   .  Alcohol abuse Sister   . Drug abuse Sister   . Depression Sister   . Anxiety disorder Sister   . OCD Sister   . Hypertension Father   . Alcohol abuse Father   . ADD / ADHD Other   . Depression Sister   . Diabetes Other   . Hypertension Other   . Diabetes  Maternal Uncle   . Seizures Cousin     Past Medical History:  Diagnosis Date  . Anxiety   . Asthma   . Depression   . Environmental allergies   . Fibromyalgia   . GERD (gastroesophageal reflux disease)   . Grave's disease   . High cholesterol   . Hypertension   . Sciatica   . Syncope and collapse   . Vasovagal syncope   . Vertigo     Past Surgical History:  Procedure Laterality Date  . ABDOMINAL HYSTERECTOMY  2006   partial  . GASTRIC BYPASS  2008    Current Outpatient Prescriptions  Medication Sig Dispense Refill  . albuterol (PROVENTIL HFA;VENTOLIN HFA) 108 (90 BASE) MCG/ACT inhaler Inhale 2 puffs into the lungs every 6 (six) hours as needed for wheezing.    Marland Kitchen amitriptyline (ELAVIL) 50 MG tablet Take 50 mg by mouth daily.    Marland Kitchen azelastine (ASTELIN) 137 MCG/SPRAY nasal spray Place 1 spray into the nose 2 (two) times daily. Use in each nostril as directed    . BIOTIN 5000 PO Take 5,000 Units by mouth daily.     . budesonide-formoterol (SYMBICORT) 80-4.5 MCG/ACT inhaler Inhale 2 puffs into the lungs 2 (two) times daily.    . cetirizine (ZYRTEC) 10 MG tablet Take 10 mg by mouth daily.    . cholecalciferol (VITAMIN D) 1000 UNITS tablet Take 1,000 Units by mouth daily.     . cyclobenzaprine (FLEXERIL) 10 MG tablet Take 1 tablet (10 mg total) by mouth 2 (two) times daily as needed for muscle spasms. 14 tablet 0  . diphenhydrAMINE (BENADRYL) 25 MG tablet Take 25 mg by mouth daily. 4 tablets every night    . Epinastine HCl (ELESTAT) 0.05 % ophthalmic solution Place 1 drop into both eyes 2 (two) times daily as needed (itching).     . EPINEPHrine (EPIPEN) 0.3 mg/0.3 mL DEVI Inject 0.3 mg into the muscle as needed. For allergic reaction to stinging insects, mushrooms, and shellfish     . estradiol (ESTRACE) 0.5 MG tablet Take 0.5 mg by mouth daily.    . ferrous sulfate 325 (65 FE) MG tablet Take 325 mg by mouth daily.    . fluticasone (FLONASE) 50 MCG/ACT nasal spray Place 1 spray  into the nose as needed.    . gabapentin (NEURONTIN) 300 MG capsule Take 300 mg by mouth 2 (two) times daily.    . hydrOXYzine (VISTARIL) 25 MG capsule TAKE ONE CAPSULE BY MOUTH ONCE DAILY AS NEEDED FOR ANXIETY 30 capsule 1  . ibuprofen (ADVIL,MOTRIN) 800 MG tablet Take 800 mg by mouth every 8 (eight) hours as needed for moderate pain.    Marland Kitchen ketorolac (TORADOL) 15 MG/ML injection Inject 15 mg into the muscle once.    . lamoTRIgine (LAMICTAL) 150 MG tablet Take 1 tab po qD- start on November 04, 2015 30 tablet 3  . meclizine (ANTIVERT) 25 MG tablet Take 1 tablet (25 mg total) by mouth every 6 (six) hours as needed for dizziness. 20 tablet 0  . Menthol-Methyl Salicylate (BEN GAY GREASELESS) 10-15 % greaseless cream Apply 1 application topically 3 (  three) times daily as needed for pain.    . mometasone-formoterol (DULERA) 100-5 MCG/ACT AERO Inhale 2 puffs into the lungs 2 (two) times daily as needed for wheezing or shortness of breath.     . montelukast (SINGULAIR) 10 MG tablet Take 1 tablet (10 mg total) by mouth at bedtime. 30 tablet 0  . Multiple Vitamins-Minerals (MULTIVITAMIN WITH MINERALS) tablet Take 1 tablet by mouth daily.    Marland Kitchen oxyCODONE-acetaminophen (PERCOCET/ROXICET) 5-325 MG per tablet Take one every 6 hours if needed for cough 12 tablet 0  . oxymetazoline (AFRIN NASAL SPRAY) 0.05 % nasal spray Place 1 spray into both nostrils 2 (two) times daily. 30 mL 0  . ranitidine (ZANTAC) 150 MG tablet Take 150 mg by mouth daily.    Marland Kitchen topiramate (TOPAMAX) 50 MG tablet Take 2 tablets (100 mg total) by mouth in the morning and 3 (tablets) by mouth at night. 150 tablet 6  . traZODone (DESYREL) 150 MG tablet Take 1 tablet (150 mg total) by mouth at bedtime as needed for sleep. 30 tablet 3  . verapamil (CALAN) 40 MG tablet Take 40 mg by mouth 2 (two) times daily.    . vitamin B-12 (CYANOCOBALAMIN) 100 MCG tablet Take 100 mcg by mouth daily.    . SUMAtriptan (IMITREX) 100 MG tablet Take 1 tablet (100 mg total)  by mouth once as needed for migraine. May repeat in 2 hours if headache persists or recurs. 10 tablet 2   No current facility-administered medications for this visit.     Allergies as of 01/13/2016 - Review Complete 01/13/2016  Allergen Reaction Noted  . Bee venom Anaphylaxis 10/10/2014  . Coconut oil Anaphylaxis and Itching 02/13/2015  . Mushroom ext cmplx(shiitake-reishi-mait) Anaphylaxis 05/05/2011  . Nutritional supplements Anaphylaxis and Itching 05/05/2011  . Other Anaphylaxis, Hives, and Itching 02/13/2015  . Shellfish allergy Anaphylaxis 05/05/2011  . Strawberry extract Anaphylaxis 02/13/2015  . Hydrocodone-acetaminophen Itching 08/13/2008    Vitals: BP 105/70 (BP Location: Right Arm, Patient Position: Sitting, Cuff Size: Large)   Pulse 77   Ht 5\' 5"  (1.651 m)   Wt 227 lb (103 kg)   BMI 37.77 kg/m  Last Weight:  Wt Readings from Last 1 Encounters:  01/13/16 227 lb (103 kg)   Last Height:   Ht Readings from Last 1 Encounters:  01/13/16 5\' 5"  (1.651 m)   Physical exam: Exam: Gen: NAD, conversant, well nourised, obese, sitting in the office with sunglasses and a face mask on.        CV: RRR, no MRG. No Carotid Bruits. No peripheral edema, warm, nontender Eyes: Conjunctivae clear without exudates or hemorrhage  Neuro: Detailed Neurologic Exam  Speech:    Speech is normal; fluent and spontaneous with normal comprehension.  Cognition:    The patient is oriented to person, place, and time;     recent and remote memory intact;     language fluent;     normal attention, concentration,     fund of knowledge Cranial Nerves:    The pupils are equal, round, and reactive to light. The fundi are normal and spontaneous venous pulsations are present. Visual fields are full to finger confrontation. Extraocular movements are intact. Trigeminal sensation is intact and the muscles of mastication are normal. The face is symmetric. The palate elevates in the midline. Hearing  intact. Voice is normal. Shoulder shrug is normal. The tongue has normal motion without fasciculations.   Coordination:    Normal finger to nose and heel to shin.  Normal rapid alternating movements.   Gait:    Heel-toe and tandem gait are normal.   Motor Observation:    No asymmetry, no atrophy, and no involuntary movements noted. Tone:    Normal muscle tone.    Posture:    Posture is normal. normal erect    Strength:    Strength is V/V in the upper and lower limbs.      Sensation: intact to LT     Reflex Exam:  DTR's:    Deep tendon reflexes in the upper and lower extremities are normal bilaterally.   Toes:    The toes are downgoing bilaterally.   Clonus:    Clonus is absent.       Assessment/Plan:  50 year old female  here for intractable migraines daily. Past medical history of hypertension, high cholesterol, chronic pain, intractable migraine, depression, anxiety, fibromyalgia, vertigo syncope, morbid obesity, severe depressed bipolar 2 disorder. Discussion with patient about lifestyle modifications, weight loss, and especially managing her depression and anxiety which can be significantly contributing. Patient sitting in the office today with sunglasses and a face mask on. Patient is already on multiple medications used in migraines (verapamil, topiramate, Neurontin, amitriptyline, and Lamictal). She is also on multiple other medications. Discussed that we can increase Topamax at this time but I would not suggest starting yet another medication. Can increase Topamax. Discussed side effects including worsening depression and also that increasing Topamax above 200 mg may interfere with birth control. I had a discussion with patient about possibly Botox for migraine. Also discussed medication overuse headache as patient uses oxycodone and other over-the-counter medications.  Cc Doristine Mango, MD  [facility] [address] [place], [place] [address]  Phone [phone] Fax [phone]

## 2016-01-13 NOTE — Patient Instructions (Addendum)
Remember to drink plenty of fluid, eat healthy meals and do not skip any meals. Try to eat protein with a every meal and eat a healthy snack such as fruit or nuts in between meals. Try to keep a regular sleep-wake schedule and try to exercise daily, particularly in the form of walking, 20-30 minutes a day, if you can.   As far as your medications are concerned, I would like to suggest: Increase Topamax as discussed  I would like to see you back in 4 months if headaches do not improve, sooner if we need to. Please call us with any interim questions, concerns, problems, updates or refill requests.  Imitrex at onset of migraine. Repeat in 2 hours if needed. No more than 2x in one day.   Our phone number is [phone]. We also have an after hours call service for urgent matters and there is a physician on-call for urgent questions. For any emergencies you know to call 911 or go to the nearest emergency room  Sumatriptan tablets What is this medicine? SUMATRIPTAN (soo ma TRIP tan) is used to treat migraines with or without aura. An aura is a strange feeling or visual disturbance that warns you of an attack. It is not used to prevent migraines. This medicine may be used for other purposes; ask your health care provider or pharmacist if you have questions. What should I tell my health care provider before I take this medicine? They need to know if you have any of these conditions: -circulation problems in fingers and toes -diabetes -heart disease -high blood pressure -high cholesterol -history of irregular heartbeat -history of stroke -kidney disease -liver disease -postmenopausal or surgical removal of uterus and ovaries -seizures -smoke tobacco -stomach or intestine problems -an unusual or allergic reaction to sumatriptan, other medicines, foods, dyes, or preservatives -pregnant or trying to get pregnant -breast-feeding How should I use this medicine? Take this medicine by mouth with a  glass of water. Follow the directions on the prescription label. This medicine is taken at the first symptoms of a migraine. It is not for everyday use. If your migraine headache returns after one dose, you can take another dose as directed. You must leave at least 2 hours between doses, and do not take more than 100 mg as a single dose. Do not take more than 200 mg total in any 24 hour period. If there is no improvement at all after the first dose, do not take a second dose without talking to your doctor or health care professional. Do not take your medicine more often than directed. Talk to your pediatrician regarding the use of this medicine in children. Special care may be needed. Overdosage: If you think you have taken too much of this medicine contact a poison control center or emergency room at once. NOTE: This medicine is only for you. Do not share this medicine with others. What if I miss a dose? This does not apply; this medicine is not for regular use. What may interact with this medicine? Do not take this medicine with any of the following medicines: -cocaine -ergot alkaloids like dihydroergotamine, ergonovine, ergotamine, methylergonovine -feverfew -MAOIs like Carbex, Eldepryl, Marplan, Nardil, and Parnate -other medicines for migraine headache like almotriptan, eletriptan, frovatriptan, naratriptan, rizatriptan, zolmitriptan -tryptophan This medicine may also interact with the following medications: -certain medicines for depression, anxiety, or psychotic disturbances This list may not describe all possible interactions. Give your health care provider a list of all the medicines, herbs, non-prescription  drugs, or dietary supplements you use. Also tell them if you smoke, drink alcohol, or use illegal drugs. Some items may interact with your medicine. What should I watch for while using this medicine? Only take this medicine for a migraine headache. Take it if you get warning symptoms  or at the start of a migraine attack. It is not for regular use to prevent migraine attacks. You may get drowsy or dizzy. Do not drive, use machinery, or do anything that needs mental alertness until you know how this medicine affects you. To reduce dizzy or fainting spells, do not sit or stand up quickly, especially if you are an older patient. Alcohol can increase drowsiness, dizziness and flushing. Avoid alcoholic drinks. Smoking cigarettes may increase the risk of heart-related side effects from using this medicine. If you take migraine medicines for 10 or more days a month, your migraines may get worse. Keep a diary of headache days and medicine use. Contact your healthcare professional if your migraine attacks occur more frequently. What side effects may I notice from receiving this medicine? Side effects that you should report to your doctor or health care professional as soon as possible: -allergic reactions like skin rash, itching or hives, swelling of the face, lips, or tongue -bloody or watery diarrhea -hallucination, loss of contact with reality -pain, tingling, numbness in the face, hands, or feet -seizures -signs and symptoms of a blood clot such as breathing problems; changes in vision; chest pain; severe, sudden headache; pain, swelling, warmth in the leg; trouble speaking; sudden numbness or weakness of the face, arm, or leg -signs and symptoms of a dangerous change in heartbeat or heart rhythm like chest pain; dizziness; fast or irregular heartbeat; palpitations, feeling faint or lightheaded; falls; breathing problems -signs and symptoms of a stroke like changes in vision; confusion; trouble speaking or understanding; severe headaches; sudden numbness or weakness of the face, arm, or leg; trouble walking; dizziness; loss of balance or coordination -stomach pain Side effects that usually do not require medical attention (report these to your doctor or health care professional if they  continue or are bothersome): -changes in taste -facial flushing -headache -muscle cramps -muscle pain -nausea, vomiting -weak or tired This list may not describe all possible side effects. Call your doctor for medical advice about side effects. You may report side effects to FDA at 1-800-FDA-1088. Where should I keep my medicine? Keep out of the reach of children. Store at room temperature between 2 and 30 degrees C (36 and 86 degrees F). Throw away any unused medicine after the expiration date. NOTE: This sheet is a summary. It may not cover all possible information. If you have questions about this medicine, talk to your doctor, pharmacist, or health care provider.    2016, Elsevier/Gold Standard. (2014-11-14 17:46:40)

## 2016-01-14 DIAGNOSIS — G43711 Chronic migraine without aura, intractable, with status migrainosus: Secondary | ICD-10-CM | POA: Insufficient documentation

## 2016-01-16 ENCOUNTER — Telehealth: Payer: Self-pay | Admitting: Neurology

## 2016-01-16 NOTE — Telephone Encounter (Signed)
[place] [phone] called to advise SUMAtriptan (IMITREX) 100 MG tablet will need PA and she is faxing request. Lorain Childes

## 2016-01-19 NOTE — Telephone Encounter (Signed)
PA was denied,[id number] on .01/17/2016

## 2016-01-20 MED ORDER — SUMATRIPTAN SUCCINATE 100 MG PO TABS: 100.0000 mg | ORAL_TABLET | Freq: Once | ORAL | 2 refills | Status: DC | PRN

## 2016-01-20 NOTE — Addendum Note (Signed)
Addended by: Hillis Range on: 01/20/2016 01:41 PM   Modules accepted: Orders

## 2016-01-20 NOTE — Telephone Encounter (Signed)
Per Dr Lucia Gaskins, we can send in 9 tablets instead of 10. Insurance will not cover 10 tablets, only 9.

## 2016-01-20 NOTE — Telephone Encounter (Signed)
Sent rx for 9 tablets instead of 10 tablets.   Called pt pharmacy and asked them to cancel rx for 10 tablets and replace with new rx sent for 9 tablets. Spoke to [place].  He stated pt picked up 10 tablets already. He cx that rx w/ refills and replaced with rx for #9 for 1 month.

## 2016-02-04 NOTE — Telephone Encounter (Signed)
Patient called requesting to speak to nurse regarding insurance company denial of SUMAtriptan (IMITREX) 100 MG tablet, please call [phone].

## 2016-02-04 NOTE — Telephone Encounter (Signed)
Dr Ahern/Carolyn- Lorain Childes  Called patient back. She stated she has been on topamax for many years and it is not helping. Advised that Dr Lucia Gaskins increased her dose and it takes about 6-12 weeks for medication increase to work.  I also clarified with her on how she is taking topamax. She verified 3 tablets in the morning and 4 at night. She stated Dr Lucia Gaskins told her to take it this way. She was taking 2 tablets in the morning and 3 at night.   I placed patient on hold and spoke to Dr Lucia Gaskins. Dr Lucia Gaskins requested she come in to see CM, NP for f/u to discuss. I offered this week or next, patient declined. She stated she had a funeral this week and is very busy. She requested the week on 9/25. Made f/u on 9/25 with CM, NP at 345pm. Pt verbalized understanding.

## 2016-02-04 NOTE — Telephone Encounter (Addendum)
Dr Lucia Gaskins- please advise  Called and spoke to patient's pharmacy. They ran patient's insurance for the 9 tablets of imitrex and it will be covered but not until tomorrow.  She should disregard letter received. That was for the 10 tablets.   Called and spoke to patient. Advised imitrex is covered. We called in 9 tablets instead of the 10 and this is covered. I spoke to her pharmacy. She verbalized understanding. She states she is out of this medication.   She states one day she took imitrex x2 and then took ibuprofen inbetween. She also tried taking oxycodone. I educated patient on medication overuse and rebound headaches. I also advised she should not take more than 2 tablets of imitrex in 24hr or 2-3 doses in a week. She verbalized understanding.   She states she is taking topamax- 3 tablets in the morning and 4 at night.  Per Dr Lucia Gaskins last OV note, she should be taking 2 tablets in the morning and 3 tablets at night.  She feels the medication is not helping.   Advised I will send the message to Dr Lucia Gaskins and call her back to advise by tomorrow at the latest. She may have to come in for a f/u to discuss. She verbalized understanding.

## 2016-02-04 NOTE — Telephone Encounter (Signed)
I just saw her less than a month ago. It takes 6-12 weeks for medication increase to work. I suggest she follow up with Darrol Angel in  1-2 months and if she is not better we can make further adjustments then thanks

## 2016-02-16 ENCOUNTER — Encounter: Payer: Self-pay | Admitting: Nurse Practitioner

## 2016-02-16 ENCOUNTER — Ambulatory Visit (INDEPENDENT_AMBULATORY_CARE_PROVIDER_SITE_OTHER): Payer: Medicare Other | Admitting: Nurse Practitioner

## 2016-02-16 VITALS — BP 129/79 | HR 86 | Ht 65.0 in | Wt 222.2 lb

## 2016-02-16 DIAGNOSIS — G43011 Migraine without aura, intractable, with status migrainosus: Secondary | ICD-10-CM | POA: Diagnosis not present

## 2016-02-16 MED ORDER — CYCLOBENZAPRINE HCL 10 MG PO TABS: 10.0000 mg | ORAL_TABLET | Freq: Every day | ORAL | 3 refills | Status: DC

## 2016-02-16 NOTE — Patient Instructions (Signed)
Try Flexeril 10 mg at night for 2 weeks then one half tablet during the day if necessary Continue Topamax at current dose Continue Imitrex when necessary only Need to think about Botox Follow-up with Dr. Lucia Gaskins

## 2016-02-16 NOTE — Progress Notes (Addendum)
[facility]  PATIENT: [name] DOB: [birth year]   REASON FOR VISIT:  Follow up for migraines HISTORY FROM: patient    HISTORY OF PRESENT ILLNESS: UPDATE 02/16/16 CMMs. Dark, 50 year old female returns for follow-up for intractable migraine. She has additional history bipolar disorder and severe depression and fibromyalgia, vertigo ,syncope, chronic pain high cholesterol,  Hypertension. Her migraines consist of a pressure pounding sensation with light sensitivity and sound sensitivity. She has not kept a record of her headaches but says they are frequent on a daily basis. She also has seasonal allergies. She takes Motrin and Percocet for her joint pain fibromyalgia and was cautioned against rebound. Stress makes her headaches worse. She is on polypharmacy. Her anxiety and depression may be contributing factor. She is already on verapamil and topiramate Neurontin amitriptyline Lamictal. She has stopped her Flexeril. She returns for reevaluation    HISTORY 01/13/16 AATheresa A Barnhardt is a 50 y.o. female here as a referral from Dr. Yetta Barre for migraines. Past medical history of hypertension, high cholesterol, chronic pain, migraine, depression, anxiety, fibromyalgia, vertigo syncope, sciatica, bipolar, morbid obesity, severe depressed bipolar 2 disorder. She has migraines daily, nothing makes them better. She had migraines for 19 days straight in July. Thought it was a sinus infection. She was placed on Topiramate many years ago for migraines and she continues to take it for 13-14s year, no inciting events or head trauma in the past, no family history of migraines. She is taking 100 mg in the morning and 100 mg at night of Topamax.  Migraines are pressure on the left, pounding, throbbing, light sensitivity, sound sensitivity, every day. She has to wear her shades inside the house which makes her feel better. She takes oxycodone prescribed for her sciatica and joint pain every 3-4  days for the migraines. Discussed rebound headaches. No aura. No vision changes, no dysarthria, no dysphasia, no meningismus. These are the same migraines that she's been having for many years, no change in severity or frequency. Stress makes them worse and she has significant depression. Smells trigger her migraines. Migraines can be severe.    REVIEW OF SYSTEMS: Full 14 system review of systems performed and notable only for those listed, all others are neg:  Constitutional: Fatigue Cardiovascular: Palpitations Ear/Nose/Throat: Dizziness Skin: neg Eyes: Blurred vision Respiratory: Cough shortness of breath wheezing Gastroitestinal: neg  Hematology/Lymphatic: neg  Endocrine: Feeling hot feeling cold increased thirst Musculoskeletal: Joint pain aching muscles Allergy/Immunology: neg Neurological: Headache weakness dizziness and passing out Psychiatric: Depression anxiety decreased energy disinterest in activities suicidal thoughts racing thoughts Sleep : Insomnia   ALLERGIES: Allergies  Allergen Reactions  . Bee Venom Anaphylaxis    Other reaction(s): PRURITUS, RASH  . Coconut Oil Anaphylaxis and Itching  . Mushroom Ext Cmplx(Shiitake-Reishi-Mait) Anaphylaxis  . Nutritional Supplements Anaphylaxis and Itching    walnuts  . Other Anaphylaxis, Hives and Itching    Ragweed   . Shellfish Allergy Anaphylaxis  . Strawberry Extract Anaphylaxis  . Hydrocodone-Acetaminophen Itching    HOME MEDICATIONS: Outpatient Medications Prior to Visit  Medication Sig Dispense Refill  . albuterol (PROVENTIL HFA;VENTOLIN HFA) 108 (90 BASE) MCG/ACT inhaler Inhale 2 puffs into the lungs every 6 (six) hours as needed for wheezing.    Marland Kitchen amitriptyline (ELAVIL) 50 MG tablet Take 50 mg by mouth daily.    Marland Kitchen azelastine (ASTELIN) 137 MCG/SPRAY nasal spray Place 1 spray into the nose 2 (two) times daily. Use in each nostril as directed    . BIOTIN  5000 PO Take 5,000 Units by mouth daily.     .  budesonide-formoterol (SYMBICORT) 80-4.5 MCG/ACT inhaler Inhale 2 puffs into the lungs 2 (two) times daily.    . cetirizine (ZYRTEC) 10 MG tablet Take 10 mg by mouth daily.    . cholecalciferol (VITAMIN D) 1000 UNITS tablet Take 1,000 Units by mouth daily.     . cyclobenzaprine (FLEXERIL) 10 MG tablet Take 1 tablet (10 mg total) by mouth 2 (two) times daily as needed for muscle spasms. 14 tablet 0  . diphenhydrAMINE (BENADRYL) 25 MG tablet Take 25 mg by mouth daily. 4 tablets every night    . Epinastine HCl (ELESTAT) 0.05 % ophthalmic solution Place 1 drop into both eyes 2 (two) times daily as needed (itching).     . EPINEPHrine (EPIPEN) 0.3 mg/0.3 mL DEVI Inject 0.3 mg into the muscle as needed. For allergic reaction to stinging insects, mushrooms, and shellfish     . estradiol (ESTRACE) 0.5 MG tablet Take 0.5 mg by mouth daily.    . ferrous sulfate 325 (65 FE) MG tablet Take 325 mg by mouth daily.    . fluticasone (FLONASE) 50 MCG/ACT nasal spray Place 1 spray into the nose as needed.    . gabapentin (NEURONTIN) 300 MG capsule Take 300 mg by mouth 2 (two) times daily.    . hydrOXYzine (VISTARIL) 25 MG capsule TAKE ONE CAPSULE BY MOUTH ONCE DAILY AS NEEDED FOR ANXIETY 30 capsule 1  . ibuprofen (ADVIL,MOTRIN) 800 MG tablet Take 800 mg by mouth every 8 (eight) hours as needed for moderate pain.    Marland Kitchen ketorolac (TORADOL) 15 MG/ML injection Inject 15 mg into the muscle once.    . lamoTRIgine (LAMICTAL) 150 MG tablet Take 1 tab po qD- start on November 04, 2015 30 tablet 3  . meclizine (ANTIVERT) 25 MG tablet Take 1 tablet (25 mg total) by mouth every 6 (six) hours as needed for dizziness. 20 tablet 0  . Menthol-Methyl Salicylate (BEN GAY GREASELESS) 10-15 % greaseless cream Apply 1 application topically 3 (three) times daily as needed for pain.    . mometasone-formoterol (DULERA) 100-5 MCG/ACT AERO Inhale 2 puffs into the lungs 2 (two) times daily as needed for wheezing or shortness of breath.     .  montelukast (SINGULAIR) 10 MG tablet Take 1 tablet (10 mg total) by mouth at bedtime. 30 tablet 0  . Multiple Vitamins-Minerals (MULTIVITAMIN WITH MINERALS) tablet Take 1 tablet by mouth daily.    Marland Kitchen oxyCODONE-acetaminophen (PERCOCET/ROXICET) 5-325 MG per tablet Take one every 6 hours if needed for cough 12 tablet 0  . oxymetazoline (AFRIN NASAL SPRAY) 0.05 % nasal spray Place 1 spray into both nostrils 2 (two) times daily. 30 mL 0  . ranitidine (ZANTAC) 150 MG tablet Take 150 mg by mouth daily.    . SUMAtriptan (IMITREX) 100 MG tablet Take 1 tablet (100 mg total) by mouth once as needed for migraine. May repeat in 2 hours if headache persists or recurs. 9 tablet 2  . topiramate (TOPAMAX) 50 MG tablet Take 2 tablets (100 mg total) by mouth in the morning and 3 (tablets) by mouth at night. 150 tablet 6  . traZODone (DESYREL) 150 MG tablet Take 1 tablet (150 mg total) by mouth at bedtime as needed for sleep. 30 tablet 3  . verapamil (CALAN) 40 MG tablet Take 40 mg by mouth 2 (two) times daily.    . vitamin B-12 (CYANOCOBALAMIN) 100 MCG tablet Take 100 mcg by mouth  daily.     No facility-administered medications prior to visit.     PAST MEDICAL HISTORY: Past Medical History:  Diagnosis Date  . Anxiety   . Asthma   . Depression   . Environmental allergies   . Fibromyalgia   . GERD (gastroesophageal reflux disease)   . Grave's disease   . High cholesterol   . Hypertension   . Sciatica   . Syncope and collapse   . Vasovagal syncope   . Vertigo     PAST SURGICAL HISTORY: Past Surgical History:  Procedure Laterality Date  . ABDOMINAL HYSTERECTOMY  2006   partial  . GASTRIC BYPASS  2008    FAMILY HISTORY: Family History  Problem Relation Age of Onset  . Hypertension Mother   . Cirrhosis Mother   . Alcohol abuse Mother   . Depression Mother   . Physical abuse Mother   . Hypertension Sister   . Alcohol abuse Sister   . Drug abuse Sister   . Depression Sister   . Anxiety  disorder Sister   . OCD Sister   . Hypertension Father   . Alcohol abuse Father   . ADD / ADHD Other   . Depression Sister   . Diabetes Other   . Hypertension Other   . Diabetes Maternal Uncle   . Seizures Cousin     SOCIAL HISTORY: Social History   Social History  . Marital status: Divorced    Spouse name: N/A  . Number of children: N/A  . Years of education: N/A   Occupational History  . Not on file.   Social History Main Topics  . Smoking status: Never Smoker  . Smokeless tobacco: Never Used  . Alcohol use 1.2 oz/week    1 Glasses of wine, 1 Shots of liquor per week     Comment: 1-2x/week  . Drug use: No  . Sexual activity: Yes    Birth control/ protection: Surgical   Other Topics Concern  . Not on file   Social History Narrative   Lives with son   Caffeine use: sometimes per patient     PHYSICAL EXAM  Vitals:   02/16/16 1547  BP: 129/79  Pulse: 86  Weight: 222 lb 3.2 oz (100.8 kg)  Height: 5\' 5"  (1.651 m)   Body mass index is 36.98 kg/m.  Generalized: Well developed, in no acute distress  Head: normocephalic and atraumatic,. Oropharynx benign  Neck: Supple, no carotid bruits  Cardiac: Regular rate rhythm, no murmur  Musculoskeletal: No deformity   Neurological examination   Mentation: Alert oriented to time, place, history taking. Attention span and concentration appropriate. Recent and remote memory intact.  Follows all commands speech and language fluent.   Cranial nerve II-XII: Fundoscopic exam reveals sharp disc margins.Pupils were equal round reactive to light extraocular movements were full, visual field were full on confrontational test. Facial sensation and strength were normal. hearing was intact to finger rubbing bilaterally. Uvula tongue midline. head turning and shoulder shrug were normal and symmetric.Tongue protrusion into cheek strength was normal. Motor: normal bulk and tone, full strength in the BUE, BLE, fine finger movements  normal, no pronator drift. No focal weakness Sensory: normal and symmetric to light touch, pinprick, and  Vibration, in the upper and lower extremities  Coordination: finger-nose-finger, heel-to-shin bilaterally, no dysmetria Reflexes: Brachioradialis 2/2, biceps 2/2, triceps 2/2, patellar 2/2, Achilles 2/2, plantar responses were flexor bilaterally. Gait and Station: Rising up from seated position without assistance, normal stance,  moderate  stride, good arm swing, smooth turning, able to perform tiptoe, and heel walking without difficulty. Tandem gait is mildly unsteady  DIAGNOSTIC DATA (LABS, IMAGING, TESTING) - I reviewed patient records, labs, notes, testing and imaging myself where available.  Lab Results  Component Value Date   WBC 6.9 09/16/2015   HGB 12.2 09/16/2015   HCT 38.2 09/16/2015   MCV 87.6 09/16/2015   PLT 294 09/16/2015      Component Value Date/Time   NA 140 09/16/2015 1738   K 4.4 09/16/2015 1738   CL 104 09/16/2015 1738   CO2 26 09/16/2015 1738   GLUCOSE 76 09/16/2015 1738   BUN 12 09/16/2015 1738   CREATININE 0.81 09/16/2015 1738   CREATININE 0.85 06/12/2014 2031   CALCIUM 9.4 09/16/2015 1738   PROT 7.2 05/06/2014 1936   ALBUMIN 4.2 05/06/2014 1936   AST 17 05/06/2014 1936   ALT 13 05/06/2014 1936   ALKPHOS 70 05/06/2014 1936   BILITOT 0.3 05/06/2014 1936   GFRNONAA >60 09/16/2015 1738   GFRAA >60 09/16/2015 1738     ASSESSMENT AND PLAN  50 y.o. year old female  has a past medical history of Anxiety; Asthma; Depression; Environmental allergies; Fibromyalgia; GERD (gastroesophageal reflux disease); Grave's disease; High cholesterol; Hypertension; Sciatica; Syncope and collapse; Vasovagal syncope; and Vertigo. To follow-up for intractable migraine. Patient also has history of bipolar disorder. She is on multiple medications that can treat her headaches  PLAN: Try Flexeril 10 mg at night for 2 weeks then one half tablet during the day if  necessary Continue Topamax at current dose Continue Imitrex when necessary only She is also on Elavil 50 mg at night and Neurontin 300 mg 2 times daily Lamictal 150 every day and verapamil 40 mg twice daily Need to think about Botox I spent additional 15 minutes in total face to face time of this 40 min vst with the patient more than 50% of which was spent counseling and coordination of care, reviewing test results reviewing medications and discussing and reviewing the diagnosis of migraine and further treatment options. Discussed Botox. Importance of keeping a diary to include the time of the headache what you're doing any other specific information that would be useful. Discussed stress relief techniques such as deep breathing muscle relaxation mental relaxation to music. Discussed importance of exercise, regular meals  and sleep. Sleep deprivation can be a migraine trigger Follow-up with Dr. Lucia Gaskins in 2 monthsVst time 40 min Nilda Riggs, [facility], [facility], APRN  [facility] [address] [place], [place] [address] [phone]  Personally have participated in and made any corrections needed to history, physical, neuro exam,assessment and plan as stated above.    Naomie Dean, MD [facility]

## 2016-02-26 ENCOUNTER — Ambulatory Visit (HOSPITAL_COMMUNITY): Payer: Self-pay | Admitting: Psychiatry

## 2016-04-14 ENCOUNTER — Other Ambulatory Visit (HOSPITAL_COMMUNITY): Payer: Self-pay | Admitting: Psychiatry

## 2016-04-14 DIAGNOSIS — G4700 Insomnia, unspecified: Secondary | ICD-10-CM

## 2016-04-21 NOTE — Telephone Encounter (Signed)
Medication refill request - Telephone message left for patient this nurse received her call requesting refills of medications.  Patient no showed for eval 02/26/16 and rescheduled for 06/01/16.  Requests refills until appointment. Informed patient on message left Dr.Agarwal is out on vacation this week so will send request to covering provider and contact her back once orders approved.

## 2016-04-27 ENCOUNTER — Encounter: Payer: Self-pay | Admitting: Neurology

## 2016-04-27 ENCOUNTER — Ambulatory Visit (INDEPENDENT_AMBULATORY_CARE_PROVIDER_SITE_OTHER): Payer: Medicare Other | Admitting: Neurology

## 2016-04-27 DIAGNOSIS — G43719 Chronic migraine without aura, intractable, without status migrainosus: Secondary | ICD-10-CM | POA: Insufficient documentation

## 2016-04-27 MED ORDER — ATENOLOL 25 MG PO TABS: 25.0000 mg | ORAL_TABLET | Freq: Every day | ORAL | 3 refills | Status: DC

## 2016-04-27 NOTE — Patient Instructions (Addendum)
Remember to drink plenty of fluid, eat healthy meals and do not skip any meals. Try to eat protein with a every meal and eat a healthy snack such as fruit or nuts in between meals. Try to keep a regular sleep-wake schedule and try to exercise daily, particularly in the form of walking, 20-30 minutes a day, if you can.   As far as your medications are concerned, I would like to suggest; Stop Verapamil. If heart monitor is normal can start Atenolol 25mg  daily for migraine. Botox referral.  I would like to see you back for botox, sooner if we need to. Please call with any interim questions, concerns, problems, updates or refill requests.   Our phone number is [phone]. We also have an after hours call service for urgent matters and there is a physician on-call for urgent questions. For any emergencies you know to call 911 or go to the nearest emergency room  Atenolol tablets What is this medicine? ATENOLOL (a TEN oh lole) is a beta-blocker. Beta-blockers reduce the workload on the heart and help it to beat more regularly. This medicine is used to treat high blood pressure and to prevent chest pain. It is also used to protect the heart during a heart attack and to prevent an additional heart attack from occurring. This medicine may be used for other purposes; ask your health care provider or pharmacist if you have questions. COMMON BRAND NAME(S): Tenormin What should I tell my health care provider before I take this medicine? They need to know if you have any of these conditions: -diabetes -heart or vessel disease like slow heart rate, worsening heart failure, heart block, sick sinus syndrome or Raynaud's disease -kidney disease -lung or breathing disease, like asthma or emphysema -pheochromocytoma -thyroid disease -an unusual or allergic reaction to atenolol, other beta-blockers, medicines, foods, dyes, or preservatives -pregnant or trying to get pregnant -breast-feeding How should I use  this medicine? Take this medicine by mouth with a drink of water. Follow the directions on the prescription label. This medicine may be taken with or without food. Take your medicine at regular intervals. Do not take more medicine than directed. Do not stop taking this medicine suddenly. This could lead to serious heart-related effects. Talk to your pediatrician regarding the use of this medicine in children. Special care may be needed. Overdosage: If you think you have taken too much of this medicine contact a poison control center or emergency room at once. NOTE: This medicine is only for you. Do not share this medicine with others. What if I miss a dose? If you miss a dose, take it as soon as you can. If it is almost time for your next dose, take only that dose. Do not take double or extra doses. What may interact with this medicine? This medicine may interact with the following medications: -certain medicines for blood pressure, heart disease, irregular heart beat -clonidine -digoxin -diuretics -dobutamine -epinephrine -isoproterenol -NSAIDs, medicines for pain and inflammation, like ibuprofen or naproxen -reserpine This list may not describe all possible interactions. Give your health care provider a list of all the medicines, herbs, non-prescription drugs, or dietary supplements you use. Also tell them if you smoke, drink alcohol, or use illegal drugs. Some items may interact with your medicine. What should I watch for while using this medicine? Visit your doctor or health care professional for regular check ups. Check your blood pressure and pulse rate regularly. Ask your health care professional what your blood  pressure and pulse rate should be, and when you should contact him or her. You may get drowsy or dizzy. Do not drive, use machinery, or do anything that needs mental alertness until you know how this medicine affects you. Do not stand or sit up quickly. Alcohol may interfere with  the effect of this medicine. Avoid alcoholic drinks. This medicine can affect blood sugar levels. If you have diabetes, check with your doctor or health care professional before you change your diet or the dose of your diabetic medicine. Do not treat yourself for coughs, colds, or pain while you are taking this medicine without asking your doctor or health care professional for advice. Some ingredients may increase your blood pressure. What side effects may I notice from receiving this medicine? Side effects that you should report to your doctor or health care professional as soon as possible: -allergic reactions like skin rash, itching or hives, swelling of the face, lips, or tongue -breathing problems -changes in vision -chest pain -cold, tingling, or numb hands or feet -depression -fast, irregular heartbeat -feeling faint or lightheaded, falls -fever with sore throat -rapid weight gain -swollen ankles, legs Side effects that usually do not require medical attention (report to your doctor or health care professional if they continue or are bothersome): -anxiety, nervous -diarrhea -dry skin -change in sex drive or performance -headache -nightmares or trouble sleeping -short term memory loss -stomach upset -unusually tired This list may not describe all possible side effects. Call your doctor for medical advice about side effects. You may report side effects to FDA at 1-800-FDA-1088. Where should I keep my medicine? Keep out of the reach of children. Store at room temperature between 20 and 25 degrees C (68 and 77 degrees F). Close tightly and protect from light. Throw away any unused medicine after the expiration date. NOTE: This sheet is a summary. It may not cover all possible information. If you have questions about this medicine, talk to your doctor, pharmacist, or health care provider.  2017 Elsevier/Gold Standard (2013-01-14 14:21:28)

## 2016-04-27 NOTE — Progress Notes (Signed)
[facility]    Provider:  Dr Lucia Gaskins Referring Provider: Iona Hansen, NP Primary Care Physician:  Iona Hansen, NP  CC:  Intractable migraines  Interval history 04/27/2016: She is having significant stress in her life, father died of COPD and sister just diagnosed. She has headaches 3x a week. Likely significant psychiatric contributions to headache. She has significant polypharmacy so I am not in favor of adding anymore medications to her list. She is already on 5 mediation sused in migraine prevention: verapamil, topiramate Neurontin amitriptyline Lamictal. She had a sleep test in the past did not need a cpap. No significant snoring as far as she knows, but she does wake up with headaches occasionally when she wakes on her back. Still has 15 migraines a month. Verapamil is on her list and likely not helping headaches, used to be used widely but last recommendations have downgraded this as a useful migraine medication. Stop Verapamil and start atenolol. She denies heart ahrythmias and if her heart monitor is normal can start the atenolol, if an ahrrythmia or any abnormality is noted please call and we can discuss. Also will refer for Botox. No medication overuse. No aura. Her migraines consist of a unilateral and whole head pressure/ pounding sensation with light sensitivity and sound sensitivity can last for days, with nausea and vomiting.   HPI:  [name] is a 50 y.o. female here as a referral from Dr. Yetta Barre for migraines. Past medical history of hypertension, high cholesterol, chronic pain, migraine, depression, anxiety, fibromyalgia, vertigo syncope, sciatica, bipolar, morbid obesity, severe depressed bipolar 2 disorder. She has migraines daily, nothing makes them better. She had migraines for 19 days straight in July. Thought it was a sinus infection. She was placed on Topiramate many years ago for migraines and she continues to take it for 13-14s year, no inciting  events or head trauma in the past, no family history of migraines. She is taking 100 mg in the morning and 100 mg at night of Topamax.  Migraines are pressure on the left, pounding, throbbing, light sensitivity, sound sensitivity, every day. She has to wear her shades inside the house which makes her feel better. She takes oxycodone prescribed for her sciatica and joint pain every 3-4 days for the migraines. Discussed rebound headaches. No aura. No vision changes, no dysarthria, no dysphasia, no meningismus. These are the same migraines that she's been having for many years, no change in severity or frequency. Stress makes them worse and she has significant depression. Smells trigger her migraines. Migraines can be severe.  Reviewed notes, labs and imaging from outside physicians, which showed:   Reviewed images of MRI brain personally on CD 12/25/2015 and agree with the following: FINDINGS: No evidence for acute infarction, hemorrhage, mass lesion, hydrocephalus, or extra-axial fluid. Normal for age cerebral volume. Minor subcortical white matter disease on the LEFT, 3-4 foci of T2 and FLAIR hyperintensity, subcentimeter size frontal region. No similar findings on the RIGHT or in the periventricular white matter. Flow voids are maintained. Artifactual gradient sequence, but no definite foci of chronic hemorrhage. The extracranial soft tissues are unremarkable.  CBC and BMP were normal in April 2017 including creatinine of 0.81.  Review of Systems: Patient complains of symptoms per HPI as well as the following symptoms: Weight gain, fatigue, blurred vision, chest pain, palpitations, shortness of breath, wheezing, feeling hot, feeling cold, increased thirst, flushing, joint pain, cramps, aching muscles, allergies, runny nose, skin sensitivity, frequent infections, memory loss, confusion,  headache, numbness, weakness, insomnia, sleepiness, restless, dizziness, passing out, depression, anxiety, not enough  sleep, decreased energy, change in appetite, disinterest in activities, suicidal thoughts, racing thoughts. Pertinent negatives per HPI. All others negative.    Social History   Social History  . Marital status: Divorced    Spouse name: N/A  . Number of children: N/A  . Years of education: N/A   Occupational History  . Not on file.   Social History Main Topics  . Smoking status: Never Smoker  . Smokeless tobacco: Never Used  . Alcohol use 1.2 oz/week    1 Glasses of wine, 1 Shots of liquor per week     Comment: 1-2x/week  . Drug use: No  . Sexual activity: Yes    Birth control/ protection: Surgical   Other Topics Concern  . Not on file   Social History Narrative   Lives with son   Caffeine use: sometimes per patient    Family History  Problem Relation Age of Onset  . Hypertension Mother   . Cirrhosis Mother   . Alcohol abuse Mother   . Depression Mother   . Physical abuse Mother   . Hypertension Sister   . Alcohol abuse Sister   . Drug abuse Sister   . Depression Sister   . Anxiety disorder Sister   . OCD Sister   . Hypertension Father   . Alcohol abuse Father   . ADD / ADHD Other   . Depression Sister   . Diabetes Other   . Hypertension Other   . Diabetes Maternal Uncle   . Seizures Cousin     Past Medical History:  Diagnosis Date  . Anxiety   . Asthma   . Depression   . Environmental allergies   . Fibromyalgia   . GERD (gastroesophageal reflux disease)   . Grave's disease   . High cholesterol   . Hypertension   . Sciatica   . Syncope and collapse   . Vasovagal syncope   . Vertigo     Past Surgical History:  Procedure Laterality Date  . ABDOMINAL HYSTERECTOMY  2006   partial  . GASTRIC BYPASS  2008    Current Outpatient Prescriptions  Medication Sig Dispense Refill  . albuterol (PROVENTIL HFA;VENTOLIN HFA) 108 (90 BASE) MCG/ACT inhaler Inhale 2 puffs into the lungs every 6 (six) hours as needed for wheezing.    Marland Kitchen. amitriptyline  (ELAVIL) 50 MG tablet Take 50 mg by mouth daily.    Marland Kitchen. azelastine (ASTELIN) 137 MCG/SPRAY nasal spray Place 1 spray into the nose 2 (two) times daily. Use in each nostril as directed    . BIOTIN 5000 PO Take 5,000 Units by mouth daily.     . budesonide-formoterol (SYMBICORT) 80-4.5 MCG/ACT inhaler Inhale 2 puffs into the lungs 2 (two) times daily.    . cetirizine (ZYRTEC) 10 MG tablet Take 10 mg by mouth daily.    . cholecalciferol (VITAMIN D) 1000 UNITS tablet Take 1,000 Units by mouth daily.     . cyclobenzaprine (FLEXERIL) 10 MG tablet Take 1 tablet (10 mg total) by mouth at bedtime. Take 1 tab at hs for 2 weeks then if needed 1/2 tab during the day 45 tablet 3  . diphenhydrAMINE (BENADRYL) 25 MG tablet Take 25 mg by mouth daily. 4 tablets every night    . Epinastine HCl (ELESTAT) 0.05 % ophthalmic solution Place 1 drop into both eyes 2 (two) times daily as needed (itching).     . EPINEPHrine (  EPIPEN) 0.3 mg/0.3 mL DEVI Inject 0.3 mg into the muscle as needed. For allergic reaction to stinging insects, mushrooms, and shellfish     . estradiol (ESTRACE) 0.5 MG tablet Take 0.5 mg by mouth daily.    . fluticasone (FLONASE) 50 MCG/ACT nasal spray Place 1 spray into the nose as needed.    . gabapentin (NEURONTIN) 300 MG capsule Take 300 mg by mouth 2 (two) times daily.    . hydrOXYzine (VISTARIL) 25 MG capsule TAKE ONE CAPSULE BY MOUTH ONCE DAILY AS NEEDED FOR ANXIETY 30 capsule 1  . ibuprofen (ADVIL,MOTRIN) 800 MG tablet Take 800 mg by mouth every 8 (eight) hours as needed for moderate pain.    Marland Kitchen lamoTRIgine (LAMICTAL) 150 MG tablet Take 1 tab po qD- start on November 04, 2015 30 tablet 3  . meclizine (ANTIVERT) 25 MG tablet Take 1 tablet (25 mg total) by mouth every 6 (six) hours as needed for dizziness. 20 tablet 0  . Menthol-Methyl Salicylate (BEN GAY GREASELESS) 10-15 % greaseless cream Apply 1 application topically 3 (three) times daily as needed for pain.    . mometasone-formoterol (DULERA) 100-5  MCG/ACT AERO Inhale 2 puffs into the lungs 2 (two) times daily as needed for wheezing or shortness of breath.     . montelukast (SINGULAIR) 10 MG tablet Take 1 tablet (10 mg total) by mouth at bedtime. 30 tablet 0  . Multiple Vitamins-Minerals (MULTIVITAMIN WITH MINERALS) tablet Take 1 tablet by mouth daily.    Marland Kitchen oxyCODONE-acetaminophen (PERCOCET/ROXICET) 5-325 MG per tablet Take one every 6 hours if needed for cough 12 tablet 0  . oxymetazoline (AFRIN NASAL SPRAY) 0.05 % nasal spray Place 1 spray into both nostrils 2 (two) times daily. 30 mL 0  . ranitidine (ZANTAC) 150 MG tablet Take 150 mg by mouth daily.    . SUMAtriptan (IMITREX) 100 MG tablet Take 1 tablet (100 mg total) by mouth once as needed for migraine. May repeat in 2 hours if headache persists or recurs. 9 tablet 2  . topiramate (TOPAMAX) 50 MG tablet Take 2 tablets (100 mg total) by mouth in the morning and 3 (tablets) by mouth at night. 150 tablet 6  . traZODone (DESYREL) 150 MG tablet TAKE ONE TABLET BY MOUTH AT BEDTIME AS NEEDED FOR SLEEP 30 tablet 3  . verapamil (CALAN) 40 MG tablet Take 40 mg by mouth 2 (two) times daily.    . vitamin B-12 (CYANOCOBALAMIN) 100 MCG tablet Take 100 mcg by mouth daily.     No current facility-administered medications for this visit.     Allergies as of 04/27/2016 - Review Complete 04/27/2016  Allergen Reaction Noted  . Bee venom Anaphylaxis 10/10/2014  . Coconut fatty acids Anaphylaxis 04/27/2016  . Mushroom ext cmplx(shiitake-reishi-mait) Anaphylaxis 05/05/2011  . Nutritional supplements Anaphylaxis and Itching 05/05/2011  . Other Anaphylaxis, Hives, and Itching 02/13/2015  . Shellfish allergy Anaphylaxis 05/05/2011  . Strawberry extract Anaphylaxis 02/13/2015  . Hydrocodone-acetaminophen Itching 08/13/2008    Vitals: BP 118/74 (BP Location: Right Arm, Patient Position: Sitting, Cuff Size: Large)   Pulse 90   Ht 5\' 5"  (1.651 m)   Wt 218 lb 12.8 oz (99.2 kg)   BMI 36.41 kg/m  Last  Weight:  Wt Readings from Last 1 Encounters:  04/27/16 218 lb 12.8 oz (99.2 kg)   Last Height:   Ht Readings from Last 1 Encounters:  04/27/16 5\' 5"  (1.651 m)     Physical exam: Exam: Gen: NAD, conversant, well nourised, obese, sitting  in the office with sunglasses and a face mask on.        CV: RRR, no MRG. No Carotid Bruits. No peripheral edema, warm, nontender Eyes: Conjunctivae clear without exudates or hemorrhage  Neuro: Detailed Neurologic Exam  Speech:    Speech is normal; fluent and spontaneous with normal comprehension.  Cognition:    The patient is oriented to person, place, and time;     recent and remote memory intact;     language fluent;     normal attention, concentration,     fund of knowledge Cranial Nerves:    The pupils are equal, round, and reactive to light. The fundi are normal and spontaneous venous pulsations are present. Visual fields are full to finger confrontation. Extraocular movements are intact. Trigeminal sensation is intact and the muscles of mastication are normal. The face is symmetric. The palate elevates in the midline. Hearing intact. Voice is normal. Shoulder shrug is normal. The tongue has normal motion without fasciculations.   Coordination:    Normal finger to nose and heel to shin. Normal rapid alternating movements.   Gait:    Heel-toe and tandem gait are normal.   Motor Observation:    No asymmetry, no atrophy, and no involuntary movements noted. Tone:    Normal muscle tone.    Posture:    Posture is normal. normal erect    Strength:    Strength is V/V in the upper and lower limbs.      Sensation: intact to LT     Reflex Exam:  DTR's:    Deep tendon reflexes in the upper and lower extremities are normal bilaterally.   Toes:    The toes are downgoing bilaterally.   Clonus:    Clonus is absent.       Assessment/Plan:  50 year old female  here for intractable migraines daily. Past medical history of  hypertension, high cholesterol, chronic pain, intractable migraine, depression, anxiety, fibromyalgia, vertigo syncope, morbid obesity, severe depressed bipolar 2 disorder. Discussion with patient about lifestyle modifications, weight loss, and especially managing her depression and anxiety which can be significantly contributing.  Patient is already on multiple medications used in migraines (verapamil, topiramate, Neurontin, amitriptyline, and Lamictal).    - Still has daily headaches and 15 migraines a month. Verapamil is on her list and likely not helping headaches, used to be used widely in migraine prevention but latest guidelines have downgraded this as a useful migraine medication. Stop Verapamil and start atenolol. She denies heart ahrythmias and if her heart monitor is normal can start the atenolol, if an ahrrythmia or any abnormality is noted please call and we can discuss.  - Also will refer for Botox.  To prevent or relieve headaches, try the following: Cool Compress. Lie down and place a cool compress on your head.  Avoid headache triggers. If certain foods or odors seem to have triggered your migraines in the past, avoid them. A headache diary might help you identify triggers.  Include physical activity in your daily routine. Try a daily walk or other moderate aerobic exercise.  Manage stress. Find healthy ways to cope with the stressors, such as delegating tasks on your to-do list.  Practice relaxation techniques. Try deep breathing, yoga, massage and visualization.  Eat regularly. Eating regularly scheduled meals and maintaining a healthy diet might help prevent headaches. Also, drink plenty of fluids.  Follow a regular sleep schedule. Sleep deprivation might contribute to headaches Consider biofeedback. With this mind-body technique, you learn to  control certain bodily functions - such as muscle tension, heart rate and blood pressure - to prevent headaches or reduce headache pain.     Proceed to emergency room if you experience new or worsening symptoms or symptoms do not resolve, if you have new neurologic symptoms or if headache is severe, or for any concerning symptom.    Cc Doristine Mango, MD  [facility] [address] [place], [place] [address]  Phone [phone] Fax [phone]  A total of 30 minutes was spent face-to-face with this patient. Over half this time was spent on counseling patient on the migraine diagnosis and different diagnostic and therapeutic options available.

## 2016-05-31 ENCOUNTER — Other Ambulatory Visit (HOSPITAL_COMMUNITY): Payer: Self-pay

## 2016-05-31 DIAGNOSIS — F410 Panic disorder [episodic paroxysmal anxiety] without agoraphobia: Secondary | ICD-10-CM

## 2016-05-31 DIAGNOSIS — F3181 Bipolar II disorder: Secondary | ICD-10-CM

## 2016-05-31 DIAGNOSIS — F4010 Social phobia, unspecified: Secondary | ICD-10-CM

## 2016-05-31 MED ORDER — HYDROXYZINE PAMOATE 25 MG PO CAPS: ORAL_CAPSULE | ORAL | 1 refills | Status: DC

## 2016-05-31 MED ORDER — LAMOTRIGINE 150 MG PO TABS: ORAL_TABLET | ORAL | 1 refills | Status: DC

## 2016-06-01 ENCOUNTER — Ambulatory Visit (HOSPITAL_COMMUNITY): Payer: Self-pay | Admitting: Psychiatry

## 2016-07-15 ENCOUNTER — Ambulatory Visit (INDEPENDENT_AMBULATORY_CARE_PROVIDER_SITE_OTHER): Payer: 59 | Admitting: Psychiatry

## 2016-07-15 ENCOUNTER — Encounter (HOSPITAL_COMMUNITY): Payer: Self-pay | Admitting: Psychiatry

## 2016-07-15 VITALS — BP 134/78 | HR 86 | Ht 65.5 in | Wt 219.2 lb

## 2016-07-15 DIAGNOSIS — Z813 Family history of other psychoactive substance abuse and dependence: Secondary | ICD-10-CM

## 2016-07-15 DIAGNOSIS — F4010 Social phobia, unspecified: Secondary | ICD-10-CM

## 2016-07-15 DIAGNOSIS — Z79891 Long term (current) use of opiate analgesic: Secondary | ICD-10-CM

## 2016-07-15 DIAGNOSIS — F411 Generalized anxiety disorder: Secondary | ICD-10-CM | POA: Diagnosis not present

## 2016-07-15 DIAGNOSIS — Z818 Family history of other mental and behavioral disorders: Secondary | ICD-10-CM

## 2016-07-15 DIAGNOSIS — Z79899 Other long term (current) drug therapy: Secondary | ICD-10-CM

## 2016-07-15 DIAGNOSIS — Z833 Family history of diabetes mellitus: Secondary | ICD-10-CM

## 2016-07-15 DIAGNOSIS — F99 Mental disorder, not otherwise specified: Secondary | ICD-10-CM | POA: Diagnosis not present

## 2016-07-15 DIAGNOSIS — F3181 Bipolar II disorder: Secondary | ICD-10-CM | POA: Diagnosis not present

## 2016-07-15 DIAGNOSIS — Z8249 Family history of ischemic heart disease and other diseases of the circulatory system: Secondary | ICD-10-CM | POA: Diagnosis not present

## 2016-07-15 DIAGNOSIS — F5105 Insomnia due to other mental disorder: Secondary | ICD-10-CM

## 2016-07-15 DIAGNOSIS — Z811 Family history of alcohol abuse and dependence: Secondary | ICD-10-CM | POA: Diagnosis not present

## 2016-07-15 DIAGNOSIS — F4001 Agoraphobia with panic disorder: Secondary | ICD-10-CM | POA: Diagnosis not present

## 2016-07-15 MED ORDER — DIVALPROEX SODIUM ER 500 MG PO TB24: 500.0000 mg | ORAL_TABLET | Freq: Every day | ORAL | 1 refills | Status: DC
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
## 2016-08-23 ENCOUNTER — Other Ambulatory Visit (HOSPITAL_COMMUNITY): Payer: Self-pay | Admitting: Psychiatry

## 2016-08-23 DIAGNOSIS — F410 Panic disorder [episodic paroxysmal anxiety] without agoraphobia: Secondary | ICD-10-CM

## 2016-08-23 DIAGNOSIS — G4700 Insomnia, unspecified: Secondary | ICD-10-CM

## 2016-08-23 DIAGNOSIS — F4010 Social phobia, unspecified: Secondary | ICD-10-CM

## 2016-08-26 ENCOUNTER — Ambulatory Visit (INDEPENDENT_AMBULATORY_CARE_PROVIDER_SITE_OTHER): Payer: 59 | Admitting: Psychiatry

## 2016-08-26 ENCOUNTER — Encounter (HOSPITAL_COMMUNITY): Payer: Self-pay | Admitting: Psychiatry

## 2016-08-26 VITALS — BP 98/68 | HR 81 | Ht 65.5 in | Wt 222.0 lb

## 2016-08-26 DIAGNOSIS — F4010 Social phobia, unspecified: Secondary | ICD-10-CM | POA: Diagnosis not present

## 2016-08-26 DIAGNOSIS — Z813 Family history of other psychoactive substance abuse and dependence: Secondary | ICD-10-CM

## 2016-08-26 DIAGNOSIS — G4701 Insomnia due to medical condition: Secondary | ICD-10-CM

## 2016-08-26 DIAGNOSIS — Z811 Family history of alcohol abuse and dependence: Secondary | ICD-10-CM

## 2016-08-26 DIAGNOSIS — F4001 Agoraphobia with panic disorder: Secondary | ICD-10-CM | POA: Diagnosis not present

## 2016-08-26 DIAGNOSIS — Z79899 Other long term (current) drug therapy: Secondary | ICD-10-CM | POA: Diagnosis not present

## 2016-08-26 DIAGNOSIS — F411 Generalized anxiety disorder: Secondary | ICD-10-CM | POA: Diagnosis not present

## 2016-08-26 DIAGNOSIS — Z79891 Long term (current) use of opiate analgesic: Secondary | ICD-10-CM | POA: Diagnosis not present

## 2016-08-26 DIAGNOSIS — F333 Major depressive disorder, recurrent, severe with psychotic symptoms: Secondary | ICD-10-CM

## 2016-08-26 DIAGNOSIS — Z818 Family history of other mental and behavioral disorders: Secondary | ICD-10-CM | POA: Diagnosis not present

## 2016-08-26 MED ORDER — DULOXETINE HCL 30 MG PO CPEP
30.0000 mg | ORAL_CAPSULE | Freq: Every day | ORAL | 1 refills | Status: DC
Start: 2016-08-26 — End: 2016-10-20

## 2016-08-26 MED ORDER — ARIPIPRAZOLE 5 MG PO TABS
5.0000 mg | ORAL_TABLET | Freq: Every day | ORAL | 1 refills | Status: DC
Start: 2016-08-26 — End: 2016-10-20

## 2016-08-26 NOTE — Progress Notes (Signed)
BH MD/PA/NP OP Progress Note  08/26/2016 1:17 PM [name]  MRN:  [id number]  Chief Complaint:  Chief Complaint    Follow-up      HPI:  Depression is about the same. She is only getting about 6 hrs/sleep and is tired all the time. She is taking the Trazodone every night.  Pt states she has continued sad mood, crying spells, poor appetite, anhedonia and worthlessness. Girlfriend and sister tell her she is using pain as an excuse. Pt denies SI/HI. Pt states she never got a script for Depakote and is not taking it.  Denies manic and hypomanic symptoms including periods of decreased need for sleep, increased energy, mood lability, impulsivity, FOI, and excessive spending.  Social anxiety is high. She feels people are talking about her and doesn't understand why people stop talking to her.  Some days she doesn't eat at all but at night she will snack on junk food. She gets irritated when she doesn't have junk food at home. Pt takes 5-6 laxatives every 4 days and states it is not helping anymore.   States memory is very poor and she does not know what to do.  Pt is having racing thoughts and palpations daily. She has at least 2 stress induced panic attacks a day. She can not tolerate any noise. Her son is getting on her nerves and she wants to him to move out.    Visit Diagnosis:    ICD-9-CM ICD-10-CM   1. Severe episode of recurrent major depressive disorder, with psychotic features (HCC) 296.34 F33.3 DULoxetine (CYMBALTA) 30 MG capsule     ARIPiprazole (ABILIFY) 5 MG tablet  2. Panic disorder with agoraphobia 300.21 F40.01 DULoxetine (CYMBALTA) 30 MG capsule  3. GAD (generalized anxiety disorder) 300.02 F41.1 DULoxetine (CYMBALTA) 30 MG capsule  4. Social anxiety disorder 300.23 F40.10 DULoxetine (CYMBALTA) 30 MG capsule  5. Insomnia due to medical condition 327.01 G47.01     Past Psychiatric History: see H&P  Past Medical History:  Past Medical History:  Diagnosis Date  .  Anxiety   . Asthma   . Depression   . Environmental allergies   . Fibromyalgia   . GERD (gastroesophageal reflux disease)   . Grave's disease   . Headache    migraines  . High cholesterol   . Hypertension   . Sciatica   . Syncope and collapse   . Vasovagal syncope   . Vertigo     Past Surgical History:  Procedure Laterality Date  . ABDOMINAL HYSTERECTOMY  2006   partial  . GASTRIC BYPASS  2008    Family Psychiatric History:  Family History  Problem Relation Age of Onset  . Hypertension Mother   . Cirrhosis Mother   . Alcohol abuse Mother   . Depression Mother   . Physical abuse Mother   . Hypertension Sister   . Alcohol abuse Sister   . Drug abuse Sister   . Depression Sister   . Anxiety disorder Sister   . OCD Sister   . Hypertension Father   . Alcohol abuse Father   . ADD / ADHD Other   . Depression Sister   . Diabetes Other   . Hypertension Other   . Diabetes Maternal Uncle   . Seizures Cousin     Social History:  Social History   Social History  . Marital status: Divorced    Spouse name: N/A  . Number of children: N/A  . Years of education: N/A  Social History Main Topics  . Smoking status: Never Smoker  . Smokeless tobacco: Never Used  . Alcohol use 1.2 oz/week    1 Glasses of wine, 1 Shots of liquor per week     Comment: 1-2x/week  . Drug use: No  . Sexual activity: Yes    Birth control/ protection: Surgical   Other Topics Concern  . Not on file   Social History Narrative   Lives with son   Caffeine use: sometimes per patient    Allergies:  Allergies  Allergen Reactions  . Bee Venom Anaphylaxis    Other reaction(s): PRURITUS, RASH  . Coconut Fatty Acids Anaphylaxis    Patient allergic to coconut in general  . Mushroom Ext Cmplx(Shiitake-Reishi-Mait) Anaphylaxis  . Nutritional Supplements Anaphylaxis and Itching    walnuts  . Other Anaphylaxis, Hives and Itching    Ragweed  . Shellfish Allergy Anaphylaxis  . Strawberry  Extract Anaphylaxis  . Hydrocodone-Acetaminophen Itching    Metabolic Disorder Labs: No results found for: HGBA1C, MPG No results found for: PROLACTIN No results found for: CHOL, TRIG, HDL, CHOLHDL, VLDL, LDLCALC   Current Medications: Current Outpatient Prescriptions  Medication Sig Dispense Refill  . albuterol (PROVENTIL HFA;VENTOLIN HFA) 108 (90 BASE) MCG/ACT inhaler Inhale 2 puffs into the lungs every 6 (six) hours as needed for wheezing.    Marland Kitchen atenolol (TENORMIN) 25 MG tablet Take 1 tablet (25 mg total) by mouth daily. 30 tablet 3  . azelastine (ASTELIN) 137 MCG/SPRAY nasal spray Place 1 spray into the nose 2 (two) times daily. Use in each nostril as directed    . BIOTIN 5000 PO Take 5,000 Units by mouth daily.     . budesonide-formoterol (SYMBICORT) 80-4.5 MCG/ACT inhaler Inhale 2 puffs into the lungs 2 (two) times daily.    . cetirizine (ZYRTEC) 10 MG tablet Take 10 mg by mouth daily.    . cholecalciferol (VITAMIN D) 1000 UNITS tablet Take 1,000 Units by mouth daily.     . clonazePAM (KLONOPIN) 0.5 MG tablet Take 0.5 mg by mouth 2 (two) times daily as needed for anxiety.    . cyclobenzaprine (FLEXERIL) 10 MG tablet Take 1 tablet (10 mg total) by mouth at bedtime. Take 1 tab at hs for 2 weeks then if needed 1/2 tab during the day 45 tablet 3  . diphenhydrAMINE (BENADRYL) 25 MG tablet Take 25 mg by mouth daily. 4 tablets every night    . Epinastine HCl (ELESTAT) 0.05 % ophthalmic solution Place 1 drop into both eyes 2 (two) times daily as needed (itching).     . EPINEPHrine (EPIPEN) 0.3 mg/0.3 mL DEVI Inject 0.3 mg into the muscle as needed. For allergic reaction to stinging insects, mushrooms, and shellfish     . estradiol (ESTRACE) 0.5 MG tablet Take 0.5 mg by mouth daily.    . fluticasone (FLONASE) 50 MCG/ACT nasal spray Place 1 spray into the nose as needed.    . gabapentin (NEURONTIN) 300 MG capsule Take 300 mg by mouth 2 (two) times daily.    Marland Kitchen ibuprofen (ADVIL,MOTRIN) 800 MG  tablet Take 800 mg by mouth every 8 (eight) hours as needed for moderate pain.    . meclizine (ANTIVERT) 25 MG tablet Take 1 tablet (25 mg total) by mouth every 6 (six) hours as needed for dizziness. 20 tablet 0  . Menthol-Methyl Salicylate (BEN GAY GREASELESS) 10-15 % greaseless cream Apply 1 application topically 3 (three) times daily as needed for pain.    . mometasone-formoterol (DULERA)  100-5 MCG/ACT AERO Inhale 2 puffs into the lungs 2 (two) times daily as needed for wheezing or shortness of breath.     . montelukast (SINGULAIR) 10 MG tablet Take 1 tablet (10 mg total) by mouth at bedtime. 30 tablet 0  . Multiple Vitamins-Minerals (MULTIVITAMIN WITH MINERALS) tablet Take 1 tablet by mouth daily.    Marland Kitchen oxyCODONE-acetaminophen (PERCOCET/ROXICET) 5-325 MG per tablet Take one every 6 hours if needed for cough 12 tablet 0  . oxymetazoline (AFRIN NASAL SPRAY) 0.05 % nasal spray Place 1 spray into both nostrils 2 (two) times daily. 30 mL 0  . ranitidine (ZANTAC) 150 MG tablet Take 150 mg by mouth daily.    . SUMAtriptan (IMITREX) 100 MG tablet Take 1 tablet (100 mg total) by mouth once as needed for migraine. May repeat in 2 hours if headache persists or recurs. 9 tablet 2  . topiramate (TOPAMAX) 50 MG tablet Take 2 tablets (100 mg total) by mouth in the morning and 3 (tablets) by mouth at night. 150 tablet 6  . traZODone (DESYREL) 150 MG tablet TAKE ONE TABLET BY MOUTH AT BEDTIME AS NEEDED FOR SLEEP 90 tablet 0  . vitamin B-12 (CYANOCOBALAMIN) 100 MCG tablet Take 100 mcg by mouth daily.    . divalproex (DEPAKOTE ER) 500 MG 24 hr tablet Take 1 tablet (500 mg total) by mouth daily. (Patient not taking: Reported on 08/26/2016) 30 tablet 1   No current facility-administered medications for this visit.     Neurologic: Headache: No Seizure: No Paresthesias: No  Musculoskeletal: Strength & Muscle Tone: within normal limits Gait & Station: normal Patient leans: N/A  Psychiatric Specialty  Exam: Review of Systems  Musculoskeletal: Positive for myalgias. Negative for back pain, joint pain and neck pain.  Neurological: Negative for dizziness, tremors, sensory change, seizures and loss of consciousness.  Psychiatric/Behavioral: Positive for depression. The patient has insomnia.     Blood pressure 98/68, pulse 81, height 5' 5.5" (1.664 m), weight 222 lb (100.7 kg).Body mass index is 36.38 kg/m.  General Appearance: Fairly Groomed  Eye Contact:  Good  Speech:  Clear and Coherent and Normal Rate  Volume:  Normal  Mood:  Depressed  Affect:  Blunt  Thought Process:  Goal Directed and Descriptions of Associations: Intact  Orientation:  Full (Time, Place, and Person)  Thought Content: Logical   Suicidal Thoughts:  No  Homicidal Thoughts:  No  Memory:  Immediate;   Poor Recent;   Poor Remote;   Poor  Judgement:  Poor  Insight:  Shallow  Psychomotor Activity:  Decreased  Concentration:  Concentration: Poor and Attention Span: Poor  Recall:  Poor  Fund of Knowledge: Fair  Language: Fair  Akathisia:  No  Handed:  Right  AIMS (if indicated):  n/a  Assets:  Desire for Improvement  ADL's:  Intact  Cognition: WNL  Sleep:  poor     Treatment Plan Summary:Medication management  Assessment: 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 Risks/benefits and SE of the medication discussed. Pt verbalized understanding and verbal consent obtained for treatment.  Affirm with the patient that the medications are taken as ordered. Patient expressed understanding of how their medications were to be used.   The risk of un-intended pregnancy is low based on the fact that pt reports she had a partial hysterectomy.  Pt is aware that these meds carry a teratogenic risk. Pt will discuss  plan of action if she does or plans to become pregnant in the future.   Meds: d/c  Depakote Pt has been taking Topamax for years- it could be contributing to her memory issues D/c Trazodone Start trial of Cymbalta 30mg  po qD for depression, anxiety  Start trial of Abilify 5mg  po qD for mood issues    Labs: EKG 09/18/15 QTc 435    Therapy: brief supportive therapy provided. Discussed psychosocial stressors in detail.   Encouraged pt to develop daily routine and work on daily goal setting as a way to improve mood symptoms.  Reviewed sleep hygiene in detail   Consultations:  Referred therapy   Pt denies SI and is at an acute low risk for suicide. Patient told to call clinic if any problems occur. Patient advised to go to ER if they should develop SI/HI, side effects, or if symptoms worsen. Has crisis numbers to call if needed. Pt verbalized understanding.  F/up in 2  months or sooner if needed   Oletta Darter, MD 08/26/2016, 1:17 PM

## 2016-09-07 ENCOUNTER — Telehealth: Payer: Self-pay | Admitting: Neurology

## 2016-09-07 NOTE — Telephone Encounter (Signed)
[name] and [name] - I received a new referral in my inbox for this patient directly to me for "Nocturnal hypoxia, Daytime sleepiness and to discuss sleep study". I have seen her in the past for migaines. Also you should make sure the sleep study results she wants to review are not recent, if she did have a recent sleep study which was not with [name] then she needs to go the provider who performed the sleep study for explanation and not [name]. You can set her up with a follow up with [facility].Thanks.

## 2016-09-16 ENCOUNTER — Encounter (HOSPITAL_COMMUNITY): Payer: Self-pay | Admitting: Licensed Clinical Social Worker

## 2016-09-16 ENCOUNTER — Ambulatory Visit (INDEPENDENT_AMBULATORY_CARE_PROVIDER_SITE_OTHER): Payer: 59 | Admitting: Licensed Clinical Social Worker

## 2016-09-16 DIAGNOSIS — F333 Major depressive disorder, recurrent, severe with psychotic symptoms: Secondary | ICD-10-CM
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
## 2016-09-23 ENCOUNTER — Other Ambulatory Visit (HOSPITAL_COMMUNITY): Payer: Self-pay | Admitting: Psychiatry

## 2016-09-23 DIAGNOSIS — F4010 Social phobia, unspecified: Secondary | ICD-10-CM

## 2016-09-23 DIAGNOSIS — F410 Panic disorder [episodic paroxysmal anxiety] without agoraphobia: Secondary | ICD-10-CM

## 2016-09-28 ENCOUNTER — Other Ambulatory Visit (HOSPITAL_COMMUNITY): Payer: Self-pay | Admitting: Psychiatry

## 2016-09-28 DIAGNOSIS — G4700 Insomnia, unspecified: Secondary | ICD-10-CM

## 2016-09-30 ENCOUNTER — Ambulatory Visit: Payer: Medicare Other | Admitting: Neurology

## 2016-09-30 ENCOUNTER — Telehealth: Payer: Self-pay

## 2016-09-30 NOTE — Telephone Encounter (Signed)
FYI  Dr. Frances Furbish will be out of the office the afternoon of 5/17. I called and LM for patient to call back and r/s appt.

## 2016-10-07 ENCOUNTER — Institutional Professional Consult (permissible substitution): Payer: Medicare Other | Admitting: Neurology

## 2016-10-20 ENCOUNTER — Other Ambulatory Visit (HOSPITAL_COMMUNITY): Payer: Self-pay | Admitting: Psychiatry

## 2016-10-20 DIAGNOSIS — F333 Major depressive disorder, recurrent, severe with psychotic symptoms: Secondary | ICD-10-CM

## 2016-10-20 DIAGNOSIS — F4010 Social phobia, unspecified: Secondary | ICD-10-CM

## 2016-10-20 DIAGNOSIS — F411 Generalized anxiety disorder: Secondary | ICD-10-CM

## 2016-10-20 DIAGNOSIS — G4700 Insomnia, unspecified: Secondary | ICD-10-CM

## 2016-10-20 DIAGNOSIS — F4001 Agoraphobia with panic disorder: Secondary | ICD-10-CM

## 2016-10-20 NOTE — Telephone Encounter (Signed)
Patient called me and I told her that the Trazodone had been d/c'd. Patient states she has continued to take it (2 tabs at bedtime) and can not sleep without it. She also stopped the Abilify because she gained 2 lbs.

## 2016-10-21 ENCOUNTER — Telehealth (HOSPITAL_COMMUNITY): Payer: Self-pay | Admitting: *Deleted

## 2016-10-21 NOTE — Telephone Encounter (Signed)
Medication refill request - Fax received from [facility] pharmacy requesting an order for Trazodone 150 mg, 2 at bedtime but medication was discontinued at last eval.

## 2016-10-21 NOTE — Telephone Encounter (Signed)
No. Will discuss at next scheduled appt

## 2016-11-04 ENCOUNTER — Ambulatory Visit (INDEPENDENT_AMBULATORY_CARE_PROVIDER_SITE_OTHER): Payer: 59 | Admitting: Psychiatry

## 2016-11-04 ENCOUNTER — Encounter (HOSPITAL_COMMUNITY): Payer: Self-pay | Admitting: Psychiatry

## 2016-11-04 DIAGNOSIS — F411 Generalized anxiety disorder: Secondary | ICD-10-CM | POA: Diagnosis not present

## 2016-11-04 DIAGNOSIS — Z811 Family history of alcohol abuse and dependence: Secondary | ICD-10-CM

## 2016-11-04 DIAGNOSIS — Z813 Family history of other psychoactive substance abuse and dependence: Secondary | ICD-10-CM

## 2016-11-04 DIAGNOSIS — F4001 Agoraphobia with panic disorder: Secondary | ICD-10-CM | POA: Diagnosis not present

## 2016-11-04 DIAGNOSIS — F333 Major depressive disorder, recurrent, severe with psychotic symptoms: Secondary | ICD-10-CM | POA: Diagnosis not present

## 2016-11-04 DIAGNOSIS — F4010 Social phobia, unspecified: Secondary | ICD-10-CM | POA: Diagnosis not present

## 2016-11-04 DIAGNOSIS — Z818 Family history of other mental and behavioral disorders: Secondary | ICD-10-CM | POA: Diagnosis not present

## 2016-11-04 MED ORDER — CLONAZEPAM 0.5 MG PO TABS: 0.5000 mg | ORAL_TABLET | Freq: Two times a day (BID) | ORAL | 1 refills | Status: DC | PRN

## 2016-11-04 MED ORDER — DULOXETINE HCL 60 MG PO CPEP: 60.0000 mg | ORAL_CAPSULE | Freq: Every day | ORAL | 0 refills | Status: DC

## 2016-11-04 MED ORDER — ARIPIPRAZOLE 5 MG PO TABS: 5.0000 mg | ORAL_TABLET | Freq: Every day | ORAL | 0 refills | Status: DC
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
## 2016-11-08 ENCOUNTER — Telehealth: Payer: Self-pay

## 2016-11-08 ENCOUNTER — Institutional Professional Consult (permissible substitution): Payer: Medicare Other | Admitting: Neurology

## 2016-11-08 NOTE — Telephone Encounter (Signed)
Pt did not show for their appt with Dr. Athar today.  

## 2016-11-10 ENCOUNTER — Encounter: Payer: Self-pay | Admitting: Neurology

## 2016-12-06 ENCOUNTER — Ambulatory Visit (HOSPITAL_COMMUNITY): Payer: Self-pay | Admitting: Licensed Clinical Social Worker

## 2016-12-14 ENCOUNTER — Other Ambulatory Visit (HOSPITAL_COMMUNITY): Payer: Self-pay | Admitting: Psychiatry

## 2016-12-14 DIAGNOSIS — F411 Generalized anxiety disorder: Secondary | ICD-10-CM

## 2016-12-14 DIAGNOSIS — F4001 Agoraphobia with panic disorder: Secondary | ICD-10-CM

## 2016-12-14 DIAGNOSIS — F333 Major depressive disorder, recurrent, severe with psychotic symptoms: Secondary | ICD-10-CM

## 2016-12-14 DIAGNOSIS — F4010 Social phobia, unspecified: Secondary | ICD-10-CM

## 2016-12-15 ENCOUNTER — Ambulatory Visit (INDEPENDENT_AMBULATORY_CARE_PROVIDER_SITE_OTHER): Payer: Medicare Other | Admitting: Neurology

## 2016-12-15 ENCOUNTER — Encounter: Payer: Self-pay | Admitting: Neurology

## 2016-12-15 VITALS — BP 118/76 | HR 69 | Ht 65.0 in | Wt 219.0 lb

## 2016-12-15 DIAGNOSIS — G43711 Chronic migraine without aura, intractable, with status migrainosus: Secondary | ICD-10-CM | POA: Diagnosis not present

## 2016-12-15 NOTE — Progress Notes (Signed)
[facility]    Provider:  Dr Lucia Gaskins Referring Provider: Iona Hansen, NP Primary Care Physician:  Iona Hansen, NP CC: Intractable migraines  Interval history 12/15/2016: Daily migraines, ongoing for years, no medication overuse. No aura.  51 year old with significant ongoing psychiatric conditions including severe depressive disorder, panic disorder with agoraphobia, generalized anxiety disorder, social anxiety disorder here for follow up of migraines. Recommended botox in 04/2016. Migraines can last all day long. Vertigo with the migraines. Start on the right, pounding and throbbing, she has nausea but can't vomit, lat for days without relief, light sensitivity and sound sensitivity. Ongoing for years. She needs to wear ear plugs.   Meds tried and failed: Atenolol, cymbalta, flexeril, neurontin, topamax, imitrex, Robaxin, lamictal, trazodone, zanaflex, paxil, seroquel  Interval history 04/27/2016: She is having significant stress in her life, father died of COPD and sister just diagnosed. She has headaches 3x a week. Likely significant psychiatric contributions to headache. She has significant polypharmacy so I am not in favor of adding anymore medications to her list. She is already on 5 mediation sused in migraine prevention: verapamil, topiramate Neurontin amitriptyline Lamictal. She had a sleep test in the past did not need a cpap. No significant snoring as far as she knows, but she does wake up with headaches occasionally when she wakes on her back. Still has 15 migraines a month. Verapamil is on her list and likely not helping headaches, used to be used widely but last recommendations have downgraded this as a useful migraine medication. Stop Verapamil and start atenolol. She denies heart ahrythmias and if her heart monitor is normal can start the atenolol, if an ahrrythmia or any abnormality is noted please call and we can discuss. Also will refer for Botox. No  medication overuse. No aura. Her migraines consist of a unilateral and whole head pressure/ pounding sensation with light sensitivity and sound sensitivity can last for days, with nausea and vomiting.   Colleen A Smithis a 51 y.o.femalehere as a referral from Dr. Esaw Grandchild migraines. Past medical history of hypertension, high cholesterol, chronic pain, migraine, depression, anxiety, fibromyalgia, vertigo syncope, sciatica, bipolar, morbid obesity, severe depressed bipolar 2 disorder. She has migraines daily, nothing makes them better. She had migraines for 19 days straight in July. Thought it was a sinus infection. She was placed on Topiramate many years ago for migraines and she continues to take it for 13-14s year, no inciting events or head trauma in the past, no family history of migraines. She is taking 100 mg in the morning and 100 mg at night of Topamax. Migraines are pressure on the left, pounding, throbbing, light sensitivity, sound sensitivity, every day. She has to wear her shades inside the house which makes her feel better. She takes oxycodone prescribed for her sciatica and joint pain every 3-4 days for the migraines. Discussed rebound headaches. No aura. No vision changes, no dysarthria, no dysphasia, no meningismus. These are the same migraines that she's been having for many years, no change in severity or frequency. Stress makes them worse and she has significant depression. Smells trigger her migraines. Migraines can be severe.  Reviewed notes, labs and imaging from outside physicians, which showed:   Reviewed images of MRI brain personally on CD 12/25/2015 and agree with the following: FINDINGS: No evidence for acute infarction, hemorrhage, mass lesion, hydrocephalus, or extra-axial fluid. Normal for age cerebral volume. Minor subcortical white matter disease on the LEFT, 3-4 foci of T2 and FLAIR hyperintensity, subcentimeter  size frontal region. No similar findings on the  RIGHT or in the periventricular white matter. Flow voids are maintained. Artifactual gradient sequence, but no definite foci of chronic hemorrhage. The extracranial soft tissues are unremarkable.  CBC and BMP were normal in April 2017 including creatinine of 0.81.  Review of Systems: Patient complains of symptoms per HPI as well as the following symptoms: Weight gain, fatigue, blurred vision, chest pain, palpitations, shortness of breath, wheezing, feeling hot, feeling cold, increased thirst, flushing, joint pain, cramps, aching muscles, allergies, runny nose, skin sensitivity, frequent infections, memory loss, confusion, headache, numbness, weakness, insomnia, sleepiness, restless, dizziness, passing out, depression, anxiety, not enough sleep, decreased energy, change in appetite, disinterest in activities, suicidal thoughts, racing thoughts. Pertinent negatives per HPI. All others negative.  Social History   Social History  . Marital status: Divorced    Spouse name: N/A  . Number of children: N/A  . Years of education: N/A   Occupational History  . Not on file.   Social History Main Topics  . Smoking status: Never Smoker  . Smokeless tobacco: Never Used  . Alcohol use No     Comment: 1-2x/week  . Drug use: No  . Sexual activity: Not Currently    Birth control/ protection: Surgical   Other Topics Concern  . Not on file   Social History Narrative   Lives with son   Caffeine use: sometimes per patient    Family History  Problem Relation Age of Onset  . Hypertension Mother   . Cirrhosis Mother   . Alcohol abuse Mother   . Depression Mother   . Physical abuse Mother   . Hypertension Sister   . Alcohol abuse Sister   . Drug abuse Sister   . Depression Sister   . Anxiety disorder Sister   . OCD Sister   . Hypertension Father   . Alcohol abuse Father   . ADD / ADHD Other   . Depression Sister   . Diabetes Other   . Hypertension Other   . Diabetes Maternal Uncle   .  Seizures Cousin     Past Medical History:  Diagnosis Date  . Anxiety   . Asthma   . Depression   . Environmental allergies   . Fibromyalgia   . GERD (gastroesophageal reflux disease)   . Grave's disease   . Headache    migraines  . High cholesterol   . Hypertension   . Sciatica   . Syncope and collapse   . Vasovagal syncope   . Vertigo     Past Surgical History:  Procedure Laterality Date  . ABDOMINAL HYSTERECTOMY  2006   partial  . GASTRIC BYPASS  2008    Current Outpatient Prescriptions  Medication Sig Dispense Refill  . albuterol (PROVENTIL HFA;VENTOLIN HFA) 108 (90 BASE) MCG/ACT inhaler Inhale 2 puffs into the lungs every 6 (six) hours as needed for wheezing.    . ARIPiprazole (ABILIFY) 5 MG tablet Take 1 tablet (5 mg total) by mouth daily. 90 tablet 0  . atenolol (TENORMIN) 25 MG tablet Take 1 tablet (25 mg total) by mouth daily. 30 tablet 3  . azelastine (ASTELIN) 137 MCG/SPRAY nasal spray Place 1 spray into the nose 2 (two) times daily. Use in each nostril as directed    . BIOTIN 5000 PO Take 5,000 Units by mouth daily.     . budesonide-formoterol (SYMBICORT) 80-4.5 MCG/ACT inhaler Inhale 2 puffs into the lungs 2 (two) times daily.    Marland Kitchen  cetirizine (ZYRTEC) 10 MG tablet Take 10 mg by mouth daily.    . cholecalciferol (VITAMIN D) 1000 UNITS tablet Take 1,000 Units by mouth daily.     . clonazePAM (KLONOPIN) 0.5 MG tablet Take 1 tablet (0.5 mg total) by mouth 2 (two) times daily as needed for anxiety. 30 tablet 1  . cyclobenzaprine (FLEXERIL) 10 MG tablet Take 1 tablet (10 mg total) by mouth at bedtime. Take 1 tab at hs for 2 weeks then if needed 1/2 tab during the day (Patient not taking: Reported on 11/04/2016) 45 tablet 3  . diphenhydrAMINE (BENADRYL) 25 MG tablet Take 25 mg by mouth daily. 4 tablets every night    . DULoxetine (CYMBALTA) 60 MG capsule Take 1 capsule (60 mg total) by mouth daily. 90 capsule 0  . Epinastine HCl (ELESTAT) 0.05 % ophthalmic solution  Place 1 drop into both eyes 2 (two) times daily as needed (itching).     . EPINEPHrine (EPIPEN) 0.3 mg/0.3 mL DEVI Inject 0.3 mg into the muscle as needed. For allergic reaction to stinging insects, mushrooms, and shellfish     . estradiol (ESTRACE) 0.5 MG tablet Take 0.5 mg by mouth daily.    . fluticasone (FLONASE) 50 MCG/ACT nasal spray Place 1 spray into the nose as needed.    . gabapentin (NEURONTIN) 300 MG capsule Take 300 mg by mouth 2 (two) times daily.    Marland Kitchen ibuprofen (ADVIL,MOTRIN) 800 MG tablet Take 800 mg by mouth every 8 (eight) hours as needed for moderate pain.    . meclizine (ANTIVERT) 25 MG tablet Take 1 tablet (25 mg total) by mouth every 6 (six) hours as needed for dizziness. 20 tablet 0  . Menthol-Methyl Salicylate (BEN GAY GREASELESS) 10-15 % greaseless cream Apply 1 application topically 3 (three) times daily as needed for pain.    . mometasone-formoterol (DULERA) 100-5 MCG/ACT AERO Inhale 2 puffs into the lungs 2 (two) times daily as needed for wheezing or shortness of breath.     . montelukast (SINGULAIR) 10 MG tablet Take 1 tablet (10 mg total) by mouth at bedtime. 30 tablet 0  . Multiple Vitamins-Minerals (MULTIVITAMIN WITH MINERALS) tablet Take 1 tablet by mouth daily.    Marland Kitchen oxyCODONE-acetaminophen (PERCOCET/ROXICET) 5-325 MG per tablet Take one every 6 hours if needed for cough (Patient not taking: Reported on 11/04/2016) 12 tablet 0  . oxymetazoline (AFRIN NASAL SPRAY) 0.05 % nasal spray Place 1 spray into both nostrils 2 (two) times daily. 30 mL 0  . ranitidine (ZANTAC) 150 MG tablet Take 150 mg by mouth daily.    . SUMAtriptan (IMITREX) 100 MG tablet Take 1 tablet (100 mg total) by mouth once as needed for migraine. May repeat in 2 hours if headache persists or recurs. 9 tablet 2  . topiramate (TOPAMAX) 50 MG tablet Take 2 tablets (100 mg total) by mouth in the morning and 3 (tablets) by mouth at night. 150 tablet 6  . vitamin B-12 (CYANOCOBALAMIN) 100 MCG tablet Take 100  mcg by mouth daily.     No current facility-administered medications for this visit.     Allergies as of 12/15/2016 - Review Complete 11/04/2016  Allergen Reaction Noted  . Bee venom Anaphylaxis 10/10/2014  . Coconut fatty acids Anaphylaxis 04/27/2016  . Mushroom ext cmplx(shiitake-reishi-mait) Anaphylaxis 05/05/2011  . Nutritional supplements Anaphylaxis and Itching 05/05/2011  . Other Anaphylaxis, Hives, and Itching 02/13/2015  . Shellfish allergy Anaphylaxis 05/05/2011  . Strawberry extract Anaphylaxis 02/13/2015  . Hydrocodone-acetaminophen Itching 08/13/2008  Vitals: There were no vitals taken for this visit. Last Weight:  Wt Readings from Last 1 Encounters:  04/27/16 218 lb 12.8 oz (99.2 kg)   Last Height:   Ht Readings from Last 1 Encounters:  04/27/16 5\' 5"  (1.651 m)   Physical exam: Exam: Gen: NAD, conversant, well nourised, obese, well groomed                     CV: RRR, no MRG. No Carotid Bruits. No peripheral edema, warm, nontender Eyes: Conjunctivae clear without exudates or hemorrhage  Neuro: Detailed Neurologic Exam  Speech:    Speech is normal; fluent and spontaneous with normal comprehension.  Cognition:    The patient is oriented to person, place, and time;     recent and remote memory intact;     language fluent;     normal attention, concentration,     fund of knowledge Cranial Nerves:    The pupils are equal, round, and reactive to light. The fundi are normal and spontaneous venous pulsations are present. Visual fields are full to finger confrontation. Extraocular movements are intact. Trigeminal sensation is intact and the muscles of mastication are normal. The face is symmetric. The palate elevates in the midline. Hearing intact. Voice is normal. Shoulder shrug is normal. The tongue has normal motion without fasciculations.   Coordination:    Normal finger to nose and heel to shin. Normal rapid alternating movements.   Gait:    Heel-toe and  tandem gait are normal.   Motor Observation:    No asymmetry, no atrophy, and no involuntary movements noted. Tone:    Normal muscle tone.    Posture:    Posture is normal. normal erect    Strength:    Strength is V/V in the upper and lower limbs.      Sensation: intact to LT     Reflex Exam:  DTR's:    Deep tendon reflexes in the upper and lower extremities are normal bilaterally.   Toes:    The toes are downgoing bilaterally.   Clonus:    Clonus is absent.   Assessment/Plan:51 year old female here for intractable migraines daily. Past medical history of hypertension, high cholesterol, chronic pain, intractable migraine, depression, anxiety, fibromyalgia, vertigo syncope, morbid obesity, severe depressed bipolar 2 disorder. Discussion with patient about lifestyle modifications, weight loss, and especially managing her depression and anxiety which can be significantly contributing.  Patient is already on multiple medications used in migraines (verapamil, topiramate, Neurontin, amitriptyline, and Lamictal).    - Still has daily headaches and 15 migraines a month. Verapamil is on her list and likely not helping headaches, used to be used widely in migraine prevention but latest guidelines have downgraded this as a useful migraine medication. Stop Verapamil and start atenolol. She denies heart ahrythmias and if her heart monitor is normal can start the atenolol, if an ahrrythmia or any abnormality is noted please call and we can discuss.  - Will initiate botox for migraine, discusse din detail  Discussed: To prevent or relieve headaches, try the following: Cool Compress. Lie down and place a cool compress on your head.  Avoid headache triggers. If certain foods or odors seem to have triggered your migraines in the past, avoid them. A headache diary might help you identify triggers.  Include physical activity in your daily routine. Try a daily walk or other moderate aerobic exercise.   Manage stress. Find healthy ways to cope with the stressors, such as delegating  tasks on your to-do list.  Practice relaxation techniques. Try deep breathing, yoga, massage and visualization.  Eat regularly. Eating regularly scheduled meals and maintaining a healthy diet might help prevent headaches. Also, drink plenty of fluids.  Follow a regular sleep schedule. Sleep deprivation might contribute to headaches Consider biofeedback. With this mind-body technique, you learn to control certain bodily functions - such as muscle tension, heart rate and blood pressure - to prevent headaches or reduce headache pain.    Proceed to emergency room if you experience new or worsening symptoms or symptoms do not resolve, if you have new neurologic symptoms or if headache is severe, or for any concerning symptom.   Provided education and documentation from American headache Society toolbox including articles on: chronic migraine medication overuse headache, chronic migraines, prevention of migraines, behavioral and other nonpharmacologic treatments for headache.  Naomie Dean, MD  [facility] [address] [place], [place] [address]  Phone [phone] Fax [phone]  A total of 15 minutes was spent face-to-face with this patient. Over half this time was spent on counseling patient on the chronic migraine diagnosis and different diagnostic and therapeutic options available.

## 2016-12-27 ENCOUNTER — Ambulatory Visit (INDEPENDENT_AMBULATORY_CARE_PROVIDER_SITE_OTHER): Payer: 59 | Admitting: Licensed Clinical Social Worker

## 2016-12-27 DIAGNOSIS — F333 Major depressive disorder, recurrent, severe with psychotic symptoms: Secondary | ICD-10-CM

## 2016-12-28 ENCOUNTER — Encounter (HOSPITAL_COMMUNITY): Payer: Self-pay | Admitting: Licensed Clinical Social Worker

## 2016-12-28 NOTE — Progress Notes (Signed)
   THERAPIST PROGRESS NOTE  Session Time: 10:15-11am  Participation Level: Minimal  Behavioral Response: CasualAlertDepressed  Type of Therapy: Individual Therapy  Treatment Goals addressed: Coping  Interventions: Supportive  Summary: [name] is a 51 y.o. female. She presented today to complete CCA that she was unable to complete last session. However, electricity was off in the office so could not complete the CCA again. Pt is reticent to come to therapy. Her sister is making her. She has lots of anxiety and did not want to come this morning. I could barely understand her due to pt speaking so low. Pt prefers to isolate and prefers to not see family or friends. Pt struggled over the weekend due to the 1 year anniversary of her father's passing. Pt sees Dr. Kenna Gilbert for medication management. Pt has fibermyalgia and also has appt to see a neurologist. Pt likes water therapy. Suggested pt look into Silver Sneakers at [facility].   Suicidal/Homicidal: Nowithout intent/plan  Therapist Response: Assessed pt's current functioning by pts self report.  Plan: Return again in 2 weeks to complete      MACKENZIE,LISBETH S, LCAS 12/27/16

## 2016-12-30 ENCOUNTER — Telehealth: Payer: Self-pay | Admitting: Neurology

## 2016-12-30 ENCOUNTER — Ambulatory Visit (INDEPENDENT_AMBULATORY_CARE_PROVIDER_SITE_OTHER): Payer: Medicare Other | Admitting: Neurology

## 2016-12-30 DIAGNOSIS — G43711 Chronic migraine without aura, intractable, with status migrainosus: Secondary | ICD-10-CM

## 2016-12-30 MED ORDER — CANDESARTAN CILEXETIL 4 MG PO TABS: ORAL_TABLET | ORAL | 6 refills | Status: DC

## 2016-12-30 NOTE — Progress Notes (Signed)

## 2016-12-30 NOTE — Progress Notes (Signed)
Botox 100 units/vial x 2 vials from [facility] [phone] Lot [id number] Exp 03 2021  Diluted in 4 ml of Bacteriostatic 0.9% NaCl NDC 9935-7017-79 Lot [id number]

## 2016-12-30 NOTE — Patient Instructions (Addendum)
STOP ATENOLOL  Start Candesartan tablets What is this medicine? CANDESARTAN (kan des AR tan) is used to treat high blood pressure in children and adults. This drug is also used to treat adults with heart failure. This medicine may be used for other purposes; ask your health care provider or pharmacist if you have questions. COMMON BRAND NAME(S): Atacand What should I tell my health care provider before I take this medicine? They need to know if you have any of these conditions: -heart failure -kidney or liver disease -if you are on a special diet, such as a low salt diet -an unusual or allergic reaction to candesartan, other medicines, foods, dyes, or preservatives -pregnant or trying to get pregnant -breast-feeding How should I use this medicine? Take this medicine by mouth with a glass of water. Follow the directions on the prescription label. This medicine can be taken with or without food. Take your doses at regular intervals. Do not take your medicine more often than directed. Talk to your pediatrician regarding the use of this medicine in children. While this drug may be prescribed for children as young as 1 year for selected conditions, precautions do apply. For children who are unable to swallow tablets, the pharmacist can prepare the medicine in an oral suspension. Overdosage: If you think you have taken too much of this medicine contact a poison control center or emergency room at once. NOTE: This medicine is only for you. Do not share this medicine with others. What if I miss a dose? If you miss a dose, take it as soon as you can. If it is almost time for your next dose, take only that dose. Do not take double or extra doses. What may interact with this medicine? -blood pressure medicines -diuretics, especially triamterene, spironolactone, or amiloride -lithium -NSAIDs, medicines for pain and inflammation, like ibuprofen or naproxen -potassium salts or potassium  supplements This list may not describe all possible interactions. Give your health care provider a list of all the medicines, herbs, non-prescription drugs, or dietary supplements you use. Also tell them if you smoke, drink alcohol, or use illegal drugs. Some items may interact with your medicine. What should I watch for while using this medicine? Visit your doctor or health care professional for regular checks on your progress. Check your blood pressure as directed. Ask your doctor or health care professional what your blood pressure should be and when you should contact him or her. Call your doctor or health care professional if you notice an irregular or fast heart beat. Women should inform their doctor if they wish to become pregnant or think they might be pregnant. There is a potential for serious side effects to an unborn child, particularly in the second or third trimester. Talk to your health care professional or pharmacist for more information. You may get drowsy or dizzy. Do not drive, use machinery, or do anything that needs mental alertness until you know how this drug affects you. Do not stand or sit up quickly, especially if you are an older patient. This reduces the risk of dizzy or fainting spells. Avoid salt substitutes unless you are told otherwise by your doctor or health care professional. Do not treat yourself for coughs, colds, or pain while you are taking this medicine without asking your doctor or health care professional for advice. Some ingredients may increase your blood pressure. What side effects may I notice from receiving this medicine? Side effects that you should report to your doctor or health  care professional as soon as possible: -allergic reactions like skin rash, itching or hives, swelling of the face, lips, or tongue -breathing problems -chest pain -decreased amount of urine passed -fast or irregular heart beat -feeling faint or lightheaded, falls -swelling of  your hands or feet Side effects that usually do not require medical attention (report to your doctor or health care professional if they continue or are bothersome): -change in sex drive or performance -cough -headache -nausea or stomach pain This list may not describe all possible side effects. Call your doctor for medical advice about side effects. You may report side effects to FDA at 1-800-FDA-1088. Where should I keep my medicine? Keep out of the reach of children. Store at room temperature between 15 and 30 degrees C (59 and 86 degrees F). Protect from light. Keep container tightly closed. Throw away any unused medicine after the expiration date. NOTE: This sheet is a summary. It may not cover all possible information. If you have questions about this medicine, talk to your doctor, pharmacist, or health care provider.  2018 Elsevier/Gold Standard (2008-03-19 10:07:40)

## 2016-12-30 NOTE — Telephone Encounter (Signed)
Per Dr. Lucia Gaskins, pt needs botox in 3 months. nb

## 2017-01-03 ENCOUNTER — Encounter: Payer: Self-pay | Admitting: Neurology

## 2017-01-03 ENCOUNTER — Ambulatory Visit (INDEPENDENT_AMBULATORY_CARE_PROVIDER_SITE_OTHER): Payer: Medicare Other | Admitting: Neurology

## 2017-01-03 VITALS — BP 100/72 | HR 80 | Ht 66.0 in | Wt 214.0 lb

## 2017-01-03 DIAGNOSIS — R400 Somnolence: Secondary | ICD-10-CM | POA: Diagnosis not present

## 2017-01-03 DIAGNOSIS — J4520 Mild intermittent asthma, uncomplicated: Secondary | ICD-10-CM

## 2017-01-03 DIAGNOSIS — Z9884 Bariatric surgery status: Secondary | ICD-10-CM | POA: Diagnosis not present

## 2017-01-03 DIAGNOSIS — G4733 Obstructive sleep apnea (adult) (pediatric): Secondary | ICD-10-CM

## 2017-01-03 DIAGNOSIS — R351 Nocturia: Secondary | ICD-10-CM

## 2017-01-03 DIAGNOSIS — E669 Obesity, unspecified: Secondary | ICD-10-CM

## 2017-01-03 DIAGNOSIS — R51 Headache: Secondary | ICD-10-CM

## 2017-01-03 DIAGNOSIS — R519 Headache, unspecified: Secondary | ICD-10-CM

## 2017-01-03 NOTE — Patient Instructions (Addendum)
Based on your symptoms and your exam I believe you may still be at risk for obstructive sleep apnea or OSA, and I think we should proceed with a sleep study to determine whether you do or do not have OSA and how severe it is. If you have more than mild OSA, I want you to consider treatment with CPAP. Please remember, the risks and ramifications of moderate to severe obstructive sleep apnea or OSA are: Cardiovascular disease, including congestive heart failure, stroke, difficult to control hypertension, arrhythmias, and even type 2 diabetes has been linked to untreated OSA. Sleep apnea causes disruption of sleep and sleep deprivation in most cases, which, in turn, can cause recurrent headaches, problems with memory, mood, concentration, focus, and vigilance. Most people with untreated sleep apnea report excessive daytime sleepiness, which can affect their ability to drive. Please do not drive if you feel sleepy.   I will likely see you back after your sleep study to go over the test results and where to go from there. We will call you after your sleep study to advise about the results (most likely, you will hear from [facility]) and to set up an appointment at the time, as necessary.    Our sleep lab administrative assistant, Alvis Lemmings will meet with you or call you to schedule your sleep study. If you don't hear back from her by about 2 weeks, please feel free to call her at [phone]. This is her direct line and please leave a message with your phone number to call back if you get the voicemail box. She will call back as soon as possible.

## 2017-01-03 NOTE — Progress Notes (Signed)
Subjective:    Patient ID: [name] is a 50 y.o. female.  HPI     Colleen Foley, MD, PhD [facility] [address] [address] [place], [place] [address]  Dear [name],   I saw your patient, [name], upon your kind request in my neurologic clinic today for initial consultation of her sleep disorder, in particular, concern for underlying obstructive sleep apnea. The patient is unaccompanied today. Of note, the patient no showed for an appointment on 11/08/2016. As you know, [name] is a 51 year old right-handed woman with an underlying medical history of mood disorder, including general anxiety disorder, depression, panic disorder, social anxiety (followed by behavioral health), thyroid disease, hypertension, hyperlipidemia, reflux disease, asthma, obesity, status post gastric bypass surgery, fibromyalgia (sees a doctor in [place] for this), recent diagnosis of rheumatoid arthritis, migraine headaches for which she is followed by Dr. Lucia Gaskins, and s/p gastric bypass surgery in 09, who reports snoring and excessive daytime somnolence. I reviewed your office note from 08/31/2016. She had an abnormal pulse oximetry test overnight reportedly showing O2 nadir of 71%, time below 88% saturation of 5 minutes. She apparently had a prior diagnosis of obstructive sleep apnea over 15 years ago but was never on CPAP therapy. Reportedly, her OSA was mild. Prior sleep study results are not available for my review today. Her Epworth sleepiness score is 13 out of 24 today, fatigue score is 62 out of 63. She is single, lives alone, she has 2 grown children. She is a nonsmoker and drinks alcohol occasionally, caffeine in the form of soda and tea, one serving per day on average. She reports having restless legs syndrome and takes gabapentin for this.  She reports a  variable bedtime and wake up time routine. She may wake up around 10 AM or later in the morning. It depends on how  well she slept. She takes trazodone 150 mg each night and sometimes 10 mg of melatonin additionally. She has nocturia about 2 or 3 times per average night and as occasional morning headaches, her sister and 2 of her maternal cousins have sleep apnea and uses a CPAP machine. She is reluctant to consider a CPAP machine but would be willing to try it. She had a tonsillectomy in her early 33s. She has difficulty falling asleep and staying asleep.  Her Past Medical History Is Significant For: Past Medical History:  Diagnosis Date  . Anxiety   . Asthma   . Depression   . Environmental allergies   . Fibromyalgia   . GERD (gastroesophageal reflux disease)   . Grave's disease   . Headache    migraines  . High cholesterol   . Hypertension   . Sciatica   . Syncope and collapse   . Vasovagal syncope   . Vertigo     Her Past Surgical History Is Significant For: Past Surgical History:  Procedure Laterality Date  . ABDOMINAL HYSTERECTOMY  2006   partial  . GASTRIC BYPASS  2008    Her Family History Is Significant For: Family History  Problem Relation Age of Onset  . Hypertension Mother   . Cirrhosis Mother   . Alcohol abuse Mother   . Depression Mother   . Physical abuse Mother   . Hypertension Sister   . Alcohol abuse Sister   . Drug abuse Sister   . Depression Sister   . Anxiety disorder Sister   . OCD Sister   . Hypertension Father   .  Alcohol abuse Father   . ADD / ADHD Other   . Depression Sister   . Diabetes Other   . Hypertension Other   . Diabetes Maternal Uncle   . Seizures Cousin     Her Social History Is Significant For: Social History   Social History  . Marital status: Divorced    Spouse name: N/A  . Number of children: N/A  . Years of education: N/A   Social History Main Topics  . Smoking status: Never Smoker  . Smokeless tobacco: Never Used  . Alcohol use No     Comment: 1-2x/week  . Drug use: No  . Sexual activity: Not Currently    Birth control/  protection: Surgical   Other Topics Concern  . None   Social History Narrative   Lives with son   Caffeine use: sometimes per patient    Her Allergies Are:  Allergies  Allergen Reactions  . Bee Venom Anaphylaxis    Other reaction(s): PRURITUS, RASH  . Coconut Fatty Acids Anaphylaxis    Patient allergic to coconut in general  . Mushroom Ext Cmplx(Shiitake-Reishi-Mait) Anaphylaxis  . Nutritional Supplements Anaphylaxis and Itching    walnuts  . Other Anaphylaxis, Hives and Itching    Ragweed  . Shellfish Allergy Anaphylaxis  . Strawberry Extract Anaphylaxis  . Hydrocodone-Acetaminophen Itching  :   Her Current Medications Are:  Outpatient Encounter Prescriptions as of 01/03/2017  Medication Sig  . albuterol (PROVENTIL HFA;VENTOLIN HFA) 108 (90 BASE) MCG/ACT inhaler Inhale 2 puffs into the lungs every 6 (six) hours as needed for wheezing.  . ARIPiprazole (ABILIFY) 5 MG tablet Take 1 tablet (5 mg total) by mouth daily.  Marland Kitchen azelastine (ASTELIN) 137 MCG/SPRAY nasal spray Place 1 spray into the nose 2 (two) times daily. Use in each nostril as directed  . BIOTIN 5000 PO Take 5,000 Units by mouth daily.   . budesonide-formoterol (SYMBICORT) 80-4.5 MCG/ACT inhaler Inhale 2 puffs into the lungs 2 (two) times daily.  . candesartan (ATACAND) 4 MG tablet Start with one tab(4mg ) daily for 7-10 days and then (if no side effects) increase to 2 tabs daily (8mg )  . cetirizine (ZYRTEC) 10 MG tablet Take 10 mg by mouth daily.  . cholecalciferol (VITAMIN D) 1000 UNITS tablet Take 1,000 Units by mouth daily.   . clonazePAM (KLONOPIN) 0.5 MG tablet Take 1 tablet (0.5 mg total) by mouth 2 (two) times daily as needed for anxiety.  . diphenhydrAMINE (BENADRYL) 25 MG tablet Take 25 mg by mouth daily. 4 tablets every night  . DULoxetine (CYMBALTA) 60 MG capsule Take 1 capsule (60 mg total) by mouth daily.  Marland Kitchen Epinastine HCl (ELESTAT) 0.05 % ophthalmic solution Place 1 drop into both eyes 2 (two) times daily  as needed (itching).   . EPINEPHrine (EPIPEN) 0.3 mg/0.3 mL DEVI Inject 0.3 mg into the muscle as needed. For allergic reaction to stinging insects, mushrooms, and shellfish   . estradiol (ESTRACE) 0.5 MG tablet Take 0.5 mg by mouth daily.  . fluticasone (FLONASE) 50 MCG/ACT nasal spray Place 1 spray into the nose as needed.  . gabapentin (NEURONTIN) 300 MG capsule Take 300 mg by mouth 2 (two) times daily.  Marland Kitchen ibuprofen (ADVIL,MOTRIN) 800 MG tablet Take 800 mg by mouth every 8 (eight) hours as needed for moderate pain.  . meclizine (ANTIVERT) 25 MG tablet Take 1 tablet (25 mg total) by mouth every 6 (six) hours as needed for dizziness.  . Menthol-Methyl Salicylate (BEN GAY GREASELESS) 10-15 % greaseless  cream Apply 1 application topically 3 (three) times daily as needed for pain.  . mometasone-formoterol (DULERA) 100-5 MCG/ACT AERO Inhale 2 puffs into the lungs 2 (two) times daily as needed for wheezing or shortness of breath.   . montelukast (SINGULAIR) 10 MG tablet Take 1 tablet (10 mg total) by mouth at bedtime.  . Multiple Vitamins-Minerals (MULTIVITAMIN WITH MINERALS) tablet Take 1 tablet by mouth daily.  Marland Kitchen oxyCODONE-acetaminophen (PERCOCET/ROXICET) 5-325 MG per tablet Take one every 6 hours if needed for cough  . oxymetazoline (AFRIN NASAL SPRAY) 0.05 % nasal spray Place 1 spray into both nostrils 2 (two) times daily.  . polyethylene glycol (MIRALAX / GLYCOLAX) packet Take 17 g by mouth daily.  . ranitidine (ZANTAC) 150 MG tablet Take 150 mg by mouth daily.  . SUMAtriptan (IMITREX) 100 MG tablet Take 1 tablet (100 mg total) by mouth once as needed for migraine. May repeat in 2 hours if headache persists or recurs.  . topiramate (TOPAMAX) 50 MG tablet Take 2 tablets (100 mg total) by mouth in the morning and 3 (tablets) by mouth at night.  . vitamin B-12 (CYANOCOBALAMIN) 100 MCG tablet Take 100 mcg by mouth daily.  . [DISCONTINUED] linaclotide (LINZESS) 72 MCG capsule Take by mouth.   No  facility-administered encounter medications on file as of 01/03/2017.   :  Review of Systems:  Out of a complete 14 point review of systems, all are reviewed and negative with the exception of these symptoms as listed below:  Review of Systems  Neurological:       Pt presents today to discuss her sleep. Pt has had a sleep study before but has never used a cpap. Pt does endorse snoring.  Epworth Sleepiness Scale 0= would never doze 1= slight chance of dozing 2= moderate chance of dozing 3= high chance of dozing  Sitting and reading: 2 Watching TV: 2 Sitting inactive in a public place (ex. Theater or meeting): 2 As a passenger in a car for an hour without a break: 3 Lying down to rest in the afternoon: 3 Sitting and talking to someone: 0 Sitting quietly after lunch (no alcohol): 1 In a car, while stopped in traffic: 0 Total: 13     Objective:  Neurological Exam  Physical Exam Physical Examination:   Vitals:   01/03/17 1317  BP: 100/72  Pulse: 80    General Examination: The patient is a very pleasant 52 y.o. female in no acute distress. She appears well-developed and well-nourished and well groomed.   HEENT: Normocephalic, atraumatic, pupils are equal, round and reactive to light and accommodation. Funduscopic exam is normal with sharp disc margins noted. Extraocular tracking is good without limitation to gaze excursion or nystagmus noted. Normal smooth pursuit is noted. Hearing is grossly intact. Face is symmetric with normal facial animation and normal facial sensation. Speech is clear with no dysarthria noted. There is no hypophonia. There is no lip, neck/head, jaw or voice tremor. Neck is supple with full range of passive and active motion. There are no carotid bruits on auscultation. Oropharynx exam reveals: mild mouth dryness, adequate dental hygiene and moderate airway crowding, due to redundant soft palate. Mallampati is class II. Tongue protrudes centrally and palate  elevates symmetrically. Tonsils are absent. Neck size is 13.25 inches. She has a Moderate overbite. Nasal inspection reveals no significant nasal mucosal bogginess or redness and no septal deviation.   Chest: Clear to auscultation without wheezing, rhonchi or crackles noted.  Heart: S1+S2+0, regular and normal  without murmurs, rubs or gallops noted.   Abdomen: Soft, non-tender and non-distended with normal bowel sounds appreciated on auscultation.  Extremities: There is no pitting edema in the distal lower extremities bilaterally. Pedal pulses are intact.  Skin: Warm and dry without trophic changes noted.  Musculoskeletal: exam reveals no obvious joint deformities, tenderness or joint swelling or erythema.   Neurologically:  Mental status: The patient is awake, alert and oriented in all 4 spheres. Her immediate and remote memory, attention, language skills and fund of knowledge are appropriate. There is no evidence of aphasia, agnosia, apraxia or anomia. Speech is clear with normal prosody and enunciation. Thought process is linear. Mood is normal and affect is constricted.  Cranial nerves II - XII are as described above under HEENT exam. In addition: shoulder shrug is normal with equal shoulder height noted. Motor exam: Normal bulk, strength and tone is noted. There is no drift, tremor or rebound. Romberg is negative. Reflexes are 1+ throughout. Fine motor skills and coordination: intact with normal finger taps, normal hand movements, normal rapid alternating patting, normal foot taps and normal foot agility.  Cerebellar testing: No dysmetria or intention tremor on finger to nose testing. Heel to shin is unremarkable bilaterally. There is no truncal or gait ataxia.  Sensory exam: intact to light touch in the upper and lower extremities.  Gait, station and balance: She stands up slowly, is able to stand narrow based. Gait is unremarkable, she is able to do tandem walk with slight difficulty.    Assessment and Plan:   In summary, [name] is a very pleasant 51 y.o.-year old female with an underlying medical history of mood disorder, including general anxiety disorder, depression, panic disorder, social anxiety (followed by behavioral health), thyroid disease, hypertension, hyperlipidemia, reflux disease, asthma, obesity, status post gastric bypass surgery, fibromyalgia (sees a doctor in [place] for this), recent diagnosis of rheumatoid arthritis, and migraine headaches for which she is followed by Dr. Lucia Gaskins, with s/p botox injection, whose history and physical exam are concerning for obstructive sleep apnea (OSA). I had a long chat with the patient about my findings and the diagnosis of OSA, its prognosis and treatment options. We talked about medical treatments, surgical interventions and non-pharmacological approaches. I explained in particular the risks and ramifications of untreated moderate to severe OSA, especially with respect to developing cardiovascular disease down the Road, including congestive heart failure, difficult to treat hypertension, cardiac arrhythmias, or stroke. Even type 2 diabetes has, in part, been linked to untreated OSA. Symptoms of untreated OSA include daytime sleepiness, memory problems, mood irritability and mood disorder such as depression and anxiety, lack of energy, as well as recurrent headaches, especially morning headaches. We talked about trying to maintain a healthy lifestyle in general, as well as the importance of weight control. I encouraged the patient to eat healthy, exercise daily and keep well hydrated, to keep a scheduled bedtime and wake time routine, to not skip any meals and eat healthy snacks in between meals. I advised the patient not to drive when feeling sleepy. I recommended the following at this time: sleep study with potential positive airway pressure titration. (We will score hypopneas at 4% and split the sleep study into diagnostic  and treatment portion, if the estimated. 2 hour AHI is >40/h).   I explained the sleep test procedure to the patient and also outlined possible surgical and non-surgical treatment options of OSA, including the use of a custom-made dental device (which would require a referral  to a specialist dentist or oral surgeon), upper airway surgical options, such as pillar implants, radiofrequency surgery, tongue base surgery, and UPPP (which would involve a referral to an ENT surgeon). Rarely, jaw surgery such as mandibular advancement may be considered.  I also explained the CPAP treatment option to the patient, who indicated that she would be willing to try CPAP if the need arises. I explained the importance of being compliant with PAP treatment, not only for insurance purposes but primarily to improve Her symptoms, and for the patient's long term health benefit, including to reduce Her cardiovascular risks. I answered all her questions today and the patient was in agreement. I would like to see her back after the sleep study is completed and encouraged her to call with any interim questions, concerns, problems or updates.   Thank you very much for allowing me to participate in the care of this nice patient. If I can be of any further assistance to you please do not hesitate to call me at [phone].  Sincerely,   Colleen Foley, MD, PhD

## 2017-01-04 ENCOUNTER — Telehealth: Payer: Self-pay | Admitting: Neurology

## 2017-01-04 NOTE — Telephone Encounter (Signed)
Patient called office returning Danielle's call in reference to scheduling Botox.  Please call

## 2017-01-04 NOTE — Telephone Encounter (Signed)
I called to schedule the patient, she did not answer so I left a VM asking her to call back.  °

## 2017-01-04 NOTE — Telephone Encounter (Signed)
Patient wants to discuss medication candesartan (ATACAND) 4 MG tablet that was prescribed. She states she needs to discuss dosage because it needs to be written again.

## 2017-01-05 MED ORDER — CANDESARTAN CILEXETIL 8 MG PO TABS: ORAL_TABLET | ORAL | 5 refills | Status: DC

## 2017-01-05 NOTE — Addendum Note (Signed)
Addended by: Donnelly Angelica on: 01/05/2017 09:14 AM   Modules accepted: Orders

## 2017-01-05 NOTE — Telephone Encounter (Signed)
Insurance will only cover max qty of 1 tab per day. Dosing the same but instructions changed for coverage.

## 2017-01-07 NOTE — Telephone Encounter (Signed)
I called and scheduled the patient for her injection.  °

## 2017-01-09 ENCOUNTER — Ambulatory Visit (INDEPENDENT_AMBULATORY_CARE_PROVIDER_SITE_OTHER): Payer: Medicare Other | Admitting: Neurology

## 2017-01-09 DIAGNOSIS — G4733 Obstructive sleep apnea (adult) (pediatric): Secondary | ICD-10-CM

## 2017-01-09 DIAGNOSIS — G4720 Circadian rhythm sleep disorder, unspecified type: Secondary | ICD-10-CM

## 2017-01-12 NOTE — Telephone Encounter (Signed)
Pt called the clinic she needs instructions for dosing changed so the insurance will pay for it. Please call to discuss

## 2017-01-12 NOTE — Telephone Encounter (Signed)
Left message requesting a return call.

## 2017-01-12 NOTE — Telephone Encounter (Signed)
Returned call to patient and reviewed instructions.  Stated she was unable to get the medication filled at the pharmacy. Called [facility] - the pharmacy does not have the medication in stock.  It will be delivered tomorrow and will be ready for pick up.  Patient is aware.

## 2017-01-13 ENCOUNTER — Encounter (HOSPITAL_COMMUNITY): Payer: Self-pay | Admitting: Psychiatry

## 2017-01-13 ENCOUNTER — Ambulatory Visit (INDEPENDENT_AMBULATORY_CARE_PROVIDER_SITE_OTHER): Payer: 59 | Admitting: Psychiatry

## 2017-01-13 DIAGNOSIS — F333 Major depressive disorder, recurrent, severe with psychotic symptoms: Secondary | ICD-10-CM

## 2017-01-13 DIAGNOSIS — R4584 Anhedonia: Secondary | ICD-10-CM

## 2017-01-13 DIAGNOSIS — F4010 Social phobia, unspecified: Secondary | ICD-10-CM | POA: Diagnosis not present

## 2017-01-13 DIAGNOSIS — F515 Nightmare disorder: Secondary | ICD-10-CM | POA: Diagnosis not present

## 2017-01-13 DIAGNOSIS — Z811 Family history of alcohol abuse and dependence: Secondary | ICD-10-CM

## 2017-01-13 DIAGNOSIS — F4001 Agoraphobia with panic disorder: Secondary | ICD-10-CM

## 2017-01-13 DIAGNOSIS — Z818 Family history of other mental and behavioral disorders: Secondary | ICD-10-CM

## 2017-01-13 DIAGNOSIS — G4700 Insomnia, unspecified: Secondary | ICD-10-CM

## 2017-01-13 DIAGNOSIS — F411 Generalized anxiety disorder: Secondary | ICD-10-CM | POA: Diagnosis not present

## 2017-01-13 DIAGNOSIS — Z813 Family history of other psychoactive substance abuse and dependence: Secondary | ICD-10-CM

## 2017-01-13 DIAGNOSIS — F39 Unspecified mood [affective] disorder: Secondary | ICD-10-CM

## 2017-01-13 DIAGNOSIS — Z634 Disappearance and death of family member: Secondary | ICD-10-CM

## 2017-01-13 MED ORDER — DULOXETINE HCL 30 MG PO CPEP: 90.0000 mg | ORAL_CAPSULE | Freq: Every day | ORAL | 1 refills | Status: DC

## 2017-01-13 MED ORDER — CLONAZEPAM 0.5 MG PO TABS: 0.5000 mg | ORAL_TABLET | Freq: Two times a day (BID) | ORAL | 1 refills | Status: DC | PRN

## 2017-01-13 MED ORDER — ARIPIPRAZOLE 5 MG PO TABS: 5.0000 mg | ORAL_TABLET | Freq: Every day | ORAL | 0 refills | Status: DC
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
## 2017-01-13 NOTE — Procedures (Signed)
PATIENT'S NAME:  [name], [name] DOB:      07/28/1965      MR#:    [id number]     DATE OF RECORDING: 01/09/2017 REFERRING M.D.:  Zoe Lan, NP Study Performed:   Baseline Polysomnogram HISTORY: 51 year old woman with a history of mood disorder, thyroid disease, hypertension, hyperlipidemia, reflux disease, asthma, obesity, status post gastric bypass surgery, fibromyalgia, rheumatoid arthritis, migraine headaches, who reports snoring and excessive daytime somnolence. The patient endorsed the Epworth Sleepiness Scale at 13/24 points. The patient's weight 214 pounds with a height of 66 (inches), resulting in a BMI of 34.4 kg/m2. The patient's neck circumference measured 13.2 inches.  CURRENT MEDICATIONS: Albuterol, Aripiprazole, Azelastine, Biotin, Budesonide, Candesartan, Cetirizine, Cholecalciferol, Clonazepam, Diphenhydramine, Duloxetine, Epinastine, Estradiol, Fluticasone, Gabapentin, Ibuprofen, Meclizine, Mometasone, Montelukast,   PROCEDURE:  This is a multichannel digital polysomnogram utilizing the Somnostar 11.2 system.  Electrodes and sensors were applied and monitored per AASM Specifications.   EEG, EOG, Chin and Limb EMG, were sampled at 200 Hz.  ECG, Snore and Nasal Pressure, Thermal Airflow, Respiratory Effort, CPAP Flow and Pressure, Oximetry was sampled at 50 Hz. Digital video and audio were recorded.      BASELINE STUDY  Lights Out was at 22:22 and Lights On at 05:00.  Total recording time (TRT) was 398 minutes, with a total sleep time (TST) of 365.5 minutes.   The patient's sleep latency was 18.5 minutes, which is normal. REM latency was 77 minutes, which is normal. The sleep efficiency was 91.8 %.     SLEEP ARCHITECTURE: WASO (Wake after sleep onset) was 27.5 minutes with mild sleep fragmentation noted. There were 10.5 minutes in Stage N1, 247 minutes Stage N2, 35.5 minutes Stage N3 and 72.5 minutes in Stage REM.  The percentage of Stage N1 was 2.9%, Stage N2 was 67.6%, which is  increased, Stage N3 was 9.7%, and Stage R (REM sleep) was 19.8%, which is near normal.  The arousals were noted as: 22 were spontaneous, 0 were associated with PLMs, 19 were associated with respiratory events.  Audio and video analysis did not show any abnormal or unusual movements, behaviors, phonations or vocalizations. The patient took 1 bathroom break. Snoring was noted, ranging from mild to moderated, intermittent. The EKG was in keeping with normal sinus rhythm (NSR).  RESPIRATORY ANALYSIS:  There were a total of 20 respiratory events:  2 obstructive apneas, 0 central apneas and 0 mixed apneas with a total of 2 apneas and an apnea index (AI) of .3 /hour. There were 18 hypopneas with a hypopnea index of 3. /hour. The patient also had 0 respiratory event related arousals (RERAs).      The total APNEA/HYPOPNEA INDEX (AHI) was 3.3/hour and the total RESPIRATORY DISTURBANCE INDEX was 3.3 /hour.  18 events occurred in REM sleep and 4 events in NREM. The REM AHI was 14.9 /hour, versus a non-REM AHI of .4. The patient spent 169.5 minutes of total sleep time in the supine position and 196 minutes in non-supine.. The supine AHI was 4.2 versus a non-supine AHI of 2.4.  OXYGEN SATURATION & C02:  The Wake baseline 02 saturation was 95%, with the lowest being 88%. Time spent below 89% saturation equaled 1 minutes.  PERIODIC LIMB MOVEMENTS: The patient had a total of 0 Periodic Limb Movements.  The Periodic Limb Movement (PLM) index was 0 and the PLM Arousal index was 0/hour.  Post-study, the patient indicated that sleep was the same as usual.   IMPRESSION:  1. Obstructive Sleep  Apnea (OSA), mild, REM related 2. Dysfunctions associated with sleep stages or arousal from sleep  RECOMMENDATIONS:  1. This study does not demonstrate any significant obstructive or central sleep disordered breathing with the exception of intermittent mild to moderate snoring and mild REM related OSA. The overall AHI is in the  normal range; CPAP therapy is not warranted. Weight loss and avoidance of the supine sleep position will likely help. For disturbing snoring, an oral appliance (through a qualified dentist) can be considered.  2. This study shows sleep fragmentation and abnormal sleep stage percentages; these are nonspecific findings and per se do not signify an intrinsic sleep disorder or a cause for the patient's sleep-related symptoms. Causes include (but are not limited to) the first night effect of the sleep study, circadian rhythm disturbances, medication effect or an underlying mood disorder or medical problem.  3. The patient should be cautioned not to drive, work at heights, or operate dangerous or heavy equipment when tired or sleepy. Review and reiteration of good sleep hygiene measures should be pursued with any patient. 4. The patient can follow-up with her referring provider, who will be notified of the test results.  I certify that I have reviewed the entire raw data recording prior to the issuance of this report in accordance with the Standards of Accreditation of the American Academy of Sleep Medicine (AASM)   Huston Foley, MD, PhD Diplomat, American Board of Psychiatry and Neurology (Neurology and Sleep Medicine)

## 2017-01-13 NOTE — Progress Notes (Signed)
Patient referred by Zoe Lan, seen by me on 01/03/17, diagnostic PSG on 01/09/17.   Please call and notify the patient that the recent sleep study did not demonstrate any significant obstructive or central sleep disordered breathing with the exception of intermittent mild to moderate snoring and mild REM related OSA. The overall AHI is in the normal range; CPAP therapy is not warranted. Weight loss and avoidance of the supine sleep position will likely help. For disturbing snoring, an oral appliance (through a qualified dentist) can be considered.  Please remind patient to try to maintain good sleep hygiene, which means: Keep a regular sleep and wake schedule and make enough time for sleep (7 1/2 to 8 1/2 hours for the average adult), try not to exercise or have a meal within 2 hours of your bedtime, try to keep your bedroom conducive for sleep, that is, cool and dark, without light distractors such as an illuminated alarm clock, and refrain from watching TV right before sleep or in the middle of the night and do not keep the TV or radio on during the night. If a nightlight is used, have it away from the visual field. Also, try not to use or play on electronic devices at bedtime, such as your cell phone, tablet PC or laptop. If you like to read at bedtime on an electronic device, try to dim the background light as much as possible. Do not eat in the middle of the night. Keep pets away from the bedroom environment. For stress relief, try meditation, deep breathing exercises (there are many books and CDs available), a white noise machine or fan can help to diffuse other noise distractors, such as traffic noise. Do not drink alcohol before bedtime, as it can disturb sleep and cause middle of the night awakenings. Never mix alcohol and sedating medications! Avoid narcotic pain medication close to bedtime, as opioids/narcotics can suppress breathing drive and breathing effort.   She can follow up with her primary  provider. Once you have spoken to patient, you can close this encounter.   Thanks,  Huston Foley, MD, PhD [facility])

## 2017-01-18 ENCOUNTER — Telehealth: Payer: Self-pay

## 2017-01-18 NOTE — Telephone Encounter (Signed)
-----   Message from Huston Foley, MD sent at 01/13/2017  7:47 AM EDT ----- Patient referred by Zoe Lan, seen by me on 01/03/17, diagnostic PSG on 01/09/17.   Please call and notify the patient that the recent sleep study did not demonstrate any significant obstructive or central sleep disordered breathing with the exception of intermittent mild to moderate snoring and mild REM related OSA. The overall AHI is in the normal range; CPAP therapy is not warranted. Weight loss and avoidance of the supine sleep position will likely help. For disturbing snoring, an oral appliance (through a qualified dentist) can be considered.  Please remind patient to try to maintain good sleep hygiene, which means: Keep a regular sleep and wake schedule and make enough time for sleep (7 1/2 to 8 1/2 hours for the average adult), try not to exercise or have a meal within 2 hours of your bedtime, try to keep your bedroom conducive for sleep, that is, cool and dark, without light distractors such as an illuminated alarm clock, and refrain from watching TV right before sleep or in the middle of the night and do not keep the TV or radio on during the night. If a nightlight is used, have it away from the visual field. Also, try not to use or play on electronic devices at bedtime, such as your cell phone, tablet PC or laptop. If you like to read at bedtime on an electronic device, try to dim the background light as much as possible. Do not eat in the middle of the night. Keep pets away from the bedroom environment. For stress relief, try meditation, deep breathing exercises (there are many books and CDs available), a white noise machine or fan can help to diffuse other noise distractors, such as traffic noise. Do not drink alcohol before bedtime, as it can disturb sleep and cause middle of the night awakenings. Never mix alcohol and sedating medications! Avoid narcotic pain medication close to bedtime, as opioids/narcotics can suppress  breathing drive and breathing effort.   She can follow up with her primary provider. Once you have spoken to patient, you can close this encounter.   Thanks,  Huston Foley, MD, PhD [facility])

## 2017-01-18 NOTE — Telephone Encounter (Signed)
I called pt. I advised pt that Dr. Frances Furbish reviewed pt's sleep study and found that pt's sleep study did not demonstrate any significant obstructive or central sleep disordered breathing with the exception of intermittent mild to moderate snoring and mild REM related OSA. The AHI was in the normal range. Dr. Frances Furbish recommends that pt pursue weight loss and avoid supine sleep. I advised pt that for disturbing snoring, an oral appliance made by a qualified dentist can be considered. I reviewed sleep hygiene recommendations with the pt, including trying to keep a regular sleep wake schedule, avoiding electronics in the bedroom, keeping the bedroom cool, dark, and quiet, and avoiding eating or exercising within 2 hours of bedtime as well as eating in the middle of the night. I advised pt to keep pets away from the bedtime. I discussed with pt the importance of stress relief and to try meditation, deep breathing exercises, and/or a white noise machine or fan to diffuse other noise distracters. I advised pt to not drink alcohol before bedtime and to never mix alcohol and sedating medications. Pt was advised to avoid narcotic pain medication close to bedtime. Pt verbalized understanding of results. Pt had no questions at this time but was encouraged to call back if questions arise.  Of note, pt reports that she took oxycodone about 1.5 hours prior to her sleep study for pain and did not inform the sleep techs. She wanted to make Dr. Frances Furbish aware of this. I reiterated that pt should avoid opioids/narcotics can suppress her breathing drive and breathing effort. I advised pt that if Dr. Frances Furbish has any further recommendations based on this report, that I will call the pt back. Pt verbalized understanding.

## 2017-02-01 ENCOUNTER — Other Ambulatory Visit (HOSPITAL_COMMUNITY): Payer: Self-pay | Admitting: Psychiatry

## 2017-02-01 ENCOUNTER — Encounter (HOSPITAL_COMMUNITY): Payer: Self-pay | Admitting: Licensed Clinical Social Worker

## 2017-02-01 ENCOUNTER — Ambulatory Visit (INDEPENDENT_AMBULATORY_CARE_PROVIDER_SITE_OTHER): Payer: Medicare Other | Admitting: Licensed Clinical Social Worker

## 2017-02-01 ENCOUNTER — Telehealth (HOSPITAL_COMMUNITY): Payer: Self-pay

## 2017-02-01 DIAGNOSIS — F333 Major depressive disorder, recurrent, severe with psychotic symptoms: Secondary | ICD-10-CM | POA: Diagnosis not present

## 2017-02-01 DIAGNOSIS — G4700 Insomnia, unspecified: Secondary | ICD-10-CM
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
## 2017-02-01 NOTE — Telephone Encounter (Signed)
Patient came in to see me after her appointment with [facility] today. She has been unable to get her Cymbalta because the pharmacy said that the prior auth had not been done. Patient also said she is still having issues sleeping. I did the prior auth last month and called [facility] to discuss with the pharmacist. They were able to run it through with no problem. I also spoke to the patient about her melatonin, she was taking it at 9 pm, I advised patient to start taking it around 6 -7 pm and she will find it works much better. Patient says she has tried and failed Ambien and Trazodone.

## 2017-02-03 ENCOUNTER — Other Ambulatory Visit (HOSPITAL_COMMUNITY): Payer: Self-pay | Admitting: Psychiatry

## 2017-02-03 MED ORDER — MIRTAZAPINE 15 MG PO TABS: 15.0000 mg | ORAL_TABLET | Freq: Every day | ORAL | 0 refills | Status: DC

## 2017-02-03 NOTE — Telephone Encounter (Signed)
Pt states for the last week she not been getting more than 3-4 hrs of sleep even with Trazodone and Melatonin. Pt has tried Ambien but it caused sleep walking. Pt states Lunesta and Vistaril were ineffective. Pt has not tried Remeron.

## 2017-03-07 ENCOUNTER — Ambulatory Visit (HOSPITAL_COMMUNITY): Payer: Self-pay | Admitting: Licensed Clinical Social Worker

## 2017-03-09 ENCOUNTER — Other Ambulatory Visit (HOSPITAL_COMMUNITY): Payer: Self-pay | Admitting: Psychiatry

## 2017-03-14 ENCOUNTER — Telehealth (HOSPITAL_COMMUNITY): Payer: Self-pay

## 2017-03-14 NOTE — Telephone Encounter (Signed)
Patient had to reschedule her appointment on 2023-04-15 due to a death in the family and that is the day of the funeral. She would like refills until her foolow up and she also wanted you to know that the Remeron is not working, she is still not sleeping.

## 2017-03-17 ENCOUNTER — Ambulatory Visit (HOSPITAL_COMMUNITY): Payer: Self-pay | Admitting: Psychiatry

## 2017-03-17 NOTE — Telephone Encounter (Signed)
Pt reports she is not sleeping well but can not talk right now as she is at a funeral. Regan please call the patient tomorrow and let her know we can try Restoril 15mg  po qHS prn insomnia and d/c Remeron.

## 2017-03-18 MED ORDER — TEMAZEPAM 15 MG PO CAPS: 15.0000 mg | ORAL_CAPSULE | Freq: Every evening | ORAL | 0 refills | Status: DC | PRN

## 2017-03-18 NOTE — Telephone Encounter (Signed)
I called in the new prescription and discontinued the Remeron. I called patient and let her know.

## 2017-03-21 ENCOUNTER — Ambulatory Visit (HOSPITAL_COMMUNITY): Payer: Self-pay | Admitting: Licensed Clinical Social Worker

## 2017-03-24 ENCOUNTER — Ambulatory Visit (HOSPITAL_COMMUNITY): Payer: Self-pay | Admitting: Licensed Clinical Social Worker

## 2017-04-06 ENCOUNTER — Other Ambulatory Visit (HOSPITAL_COMMUNITY): Payer: Self-pay | Admitting: Psychiatry

## 2017-04-06 DIAGNOSIS — F4001 Agoraphobia with panic disorder: Secondary | ICD-10-CM

## 2017-04-06 DIAGNOSIS — F411 Generalized anxiety disorder: Secondary | ICD-10-CM

## 2017-04-06 DIAGNOSIS — F333 Major depressive disorder, recurrent, severe with psychotic symptoms: Secondary | ICD-10-CM

## 2017-04-06 DIAGNOSIS — F4010 Social phobia, unspecified: Secondary | ICD-10-CM

## 2017-04-11 ENCOUNTER — Ambulatory Visit (INDEPENDENT_AMBULATORY_CARE_PROVIDER_SITE_OTHER): Payer: Medicare Other | Admitting: Licensed Clinical Social Worker

## 2017-04-11 ENCOUNTER — Encounter (HOSPITAL_COMMUNITY): Payer: Self-pay | Admitting: Licensed Clinical Social Worker

## 2017-04-11 DIAGNOSIS — F333 Major depressive disorder, recurrent, severe with psychotic symptoms: Secondary | ICD-10-CM

## 2017-04-11 NOTE — Progress Notes (Signed)
   THERAPIST PROGRESS NOTE  Session Time: 10:15-11am  Participation Level: Minimal  Behavioral Response: CasualAlertDepressed  Type of Therapy: Individual Therapy  Treatment Goals addressed: Coping  Interventions: Supportive  Summary: [name] is a 51 y.o. female who presents for her individual counseling session. Pt has not been to therapy in 2 months. Pt is still reticent to come to therapy. She does not want to open up or participate in therapy. Asked pt open ended questions about this. Will continue to work with pt on building a trusting therapeutic relationship. Pt agreed to continue with therapy at this time.  Pt shares she feels her medications are working as she has less crying and less stress. She also has more days she is getting out of bed. Discussed pt's stressors: bills, kids, health and just life in general. Explained purpose and process and practice of mindfulness techniques. Pt appeared receptive to the mindfulness practice.   Suicidal/Homicidal: Nowithout intent/plan  Therapist Response: Assessed pt's current functioning by pts self report. Assisted pt in continuing to build a trusting therapeutic relationship  Plan: Return again 1 month      Darick Fetters S, LCAS 04/11/17

## 2017-04-12 ENCOUNTER — Encounter: Payer: Self-pay | Admitting: Neurology

## 2017-04-12 ENCOUNTER — Ambulatory Visit (INDEPENDENT_AMBULATORY_CARE_PROVIDER_SITE_OTHER): Payer: Medicare Other | Admitting: Neurology

## 2017-04-12 VITALS — BP 120/76 | HR 74

## 2017-04-12 DIAGNOSIS — G43711 Chronic migraine without aura, intractable, with status migrainosus: Secondary | ICD-10-CM

## 2017-04-12 MED ORDER — SUMATRIPTAN SUCCINATE 100 MG PO TABS
100.0000 mg | ORAL_TABLET | Freq: Once | ORAL | 12 refills | Status: DC | PRN
Start: 2017-04-12 — End: 2019-03-27

## 2017-04-12 NOTE — Progress Notes (Addendum)
Botox 100 units x 2 vials LOT: [id number] EXP: 10/2019 NDC: 3762-8315-17 B&B G43.711  Bacteriostatic 0.9% Sodium Chloride LOT: [id number] EXP: 09/22/2018 NDC: 7106-2694-85 //BCrn

## 2017-04-12 NOTE — Progress Notes (Signed)
Interval history 04/12/2017: Today will be our second botox. Daily migraines and failed multiple medications. Did feel improved, headaches worsened near the end of botox cycle, asked to keep migraine diary. +temples, masseters, lateral eyes.    Consent Form Botulism Toxin Injection For Chronic Migraine  Botulism toxin has been approved by the Federal drug administration for treatment of chronic migraine. Botulism toxin does not cure chronic migraine and it may not be effective in some patients.  The administration of botulism toxin is accomplished by injecting a small amount of toxin into the muscles of the neck and head. Dosage must be titrated for each individual. Any benefits resulting from botulism toxin tend to wear off after 3 months with a repeat injection required if benefit is to be maintained. Injections are usually done every 3-4 months with maximum effect peak achieved by about 2 or 3 weeks. Botulism toxin is expensive and you should be sure of what costs you will incur resulting from the injection.  The side effects of botulism toxin use for chronic migraine may include:   -Transient, and usually mild, facial weakness with facial injections  -Transient, and usually mild, head or neck weakness with head/neck injections  -Reduction or loss of forehead facial animation due to forehead muscle              weakness  -Eyelid drooping  -Dry eye  -Pain at the site of injection or bruising at the site of injection  -Double vision  -Potential unknown long term risks  Contraindications: You should not have Botox if you are pregnant, nursing, allergic to albumin, have an infection, skin condition, or muscle weakness at the site of the injection, or have myasthenia gravis, Lambert-Eaton syndrome, or ALS.  It is also possible that as with any injection, there may be an allergic reaction or no effect from the medication. Reduced effectiveness after repeated injections is sometimes seen and  rarely infection at the injection site may occur. All care will be taken to prevent these side effects. If therapy is given over a long time, atrophy and wasting in the muscle injected may occur. Occasionally the patient's become refractory to treatment because they develop antibodies to the toxin. In this event, therapy needs to be modified.  I have read the above information and consent to the administration of botulism toxin.    ______________  _____   _________________  Patient signature     Date   Witness signature       BOTOX PROCEDURE NOTE FOR MIGRAINE HEADACHE    Contraindications and precautions discussed with patient(above). Aseptic procedure was observed and patient tolerated procedure. Procedure performed by Dr. Artemio Aly  The condition has existed for more than 6 months, and pt does not have a diagnosis of ALS, Myasthenia Gravis or Lambert-Eaton Syndrome. Risks and benefits of injections discussed and pt agrees to proceed with the procedure. Written consent obtained  These injections are medically necessary. He receives good benefits from these injections. These injections do not cause sedations or hallucinations which the oral therapies may cause.  Indication/Diagnosis: chronic migraine BOTOX(J0585) injection was performed according to protocol by [employer]. 200 units of BOTOX was dissolved into 4 cc NS.  NDC: [address]-19   Description of procedure:  The patient was placed in a sitting position. The standard protocol was used for Botox as follows, with 5 units of Botox injected at each site:   -Procerus muscle, midline injection  -Corrugator muscle, bilateral injection  -Frontalis muscle, bilateral  injection, with 2 sites each side, medial injection was performed in the upper one third of the frontalis muscle, in the region vertical from the medial inferior edge of the superior orbital rim. The lateral injection was again in the upper one third of the forehead  vertically above the lateral limbus of the cornea, 1.5 cm lateral to the medial injection site.  -Temporalis muscle injection, 4 sites, bilaterally. The first injection was 3 cm above the tragus of the ear, second injection site was 1.5 cm to 3 cm up from the first injection site in line with the tragus of the ear. The third injection site was 1.5-3 cm forward between the first 2 injection sites. The fourth injection site was 1.5 cm posterior to the second injection site.  -Occipitalis muscle injection, 3 sites, bilaterally. The first injection was done one half way between the occipital protuberance and the tip of the mastoid process behind the ear. The second injection site was done lateral and superior to the first, 1 fingerbreadth from the first injection. The third injection site was 1 fingerbreadth superiorly and medially from the first injection site.  -Cervical paraspinal muscle injection, 2 sites, bilateral knee first injection site was 1 cm from the midline of the cervical spine, 3 cm inferior to the lower border of the occipital protuberance. The second injection site was 1.5 cm superiorly and laterally to the first injection site.  -Trapezius muscle injection was performed at 3 sites, bilaterally. The first injection site was in the upper trapezius muscle halfway between the inflection point of the neck, and the acromion. The second injection site was one half way between the acromion and the first injection site. The third injection was done between the first injection site and the inflection point of the neck.   Will return for repeat injection in 3 months.   A 200 unit sof Botox was used, 155 units were injected, the rest of the Botox was wasted. The patient tolerated the procedure well, there were no complications of the above procedure.

## 2017-04-21 ENCOUNTER — Other Ambulatory Visit (HOSPITAL_COMMUNITY): Payer: Self-pay | Admitting: Psychiatry

## 2017-04-25 ENCOUNTER — Other Ambulatory Visit (HOSPITAL_COMMUNITY): Payer: Self-pay

## 2017-04-25 ENCOUNTER — Ambulatory Visit (HOSPITAL_COMMUNITY): Payer: Self-pay | Admitting: Licensed Clinical Social Worker

## 2017-04-25 MED ORDER — TEMAZEPAM 15 MG PO CAPS: 15.0000 mg | ORAL_CAPSULE | Freq: Every evening | ORAL | 0 refills | Status: DC | PRN

## 2017-04-28 ENCOUNTER — Encounter (HOSPITAL_COMMUNITY): Payer: Self-pay | Admitting: Psychiatry

## 2017-04-28 ENCOUNTER — Ambulatory Visit (INDEPENDENT_AMBULATORY_CARE_PROVIDER_SITE_OTHER): Payer: Medicare Other | Admitting: Psychiatry

## 2017-04-28 DIAGNOSIS — F4010 Social phobia, unspecified: Secondary | ICD-10-CM

## 2017-04-28 DIAGNOSIS — J189 Pneumonia, unspecified organism: Secondary | ICD-10-CM

## 2017-04-28 DIAGNOSIS — Z813 Family history of other psychoactive substance abuse and dependence: Secondary | ICD-10-CM | POA: Diagnosis not present

## 2017-04-28 DIAGNOSIS — Z811 Family history of alcohol abuse and dependence: Secondary | ICD-10-CM

## 2017-04-28 DIAGNOSIS — F4001 Agoraphobia with panic disorder: Secondary | ICD-10-CM

## 2017-04-28 DIAGNOSIS — Z818 Family history of other mental and behavioral disorders: Secondary | ICD-10-CM | POA: Diagnosis not present

## 2017-04-28 DIAGNOSIS — F333 Major depressive disorder, recurrent, severe with psychotic symptoms: Secondary | ICD-10-CM | POA: Diagnosis not present

## 2017-04-28 DIAGNOSIS — F411 Generalized anxiety disorder: Secondary | ICD-10-CM

## 2017-04-28 MED ORDER — ARIPIPRAZOLE 5 MG PO TABS: 5.0000 mg | ORAL_TABLET | Freq: Every day | ORAL | 0 refills | Status: DC

## 2017-04-28 MED ORDER — CLONAZEPAM 0.5 MG PO TABS: 0.5000 mg | ORAL_TABLET | Freq: Two times a day (BID) | ORAL | 1 refills | Status: DC | PRN

## 2017-04-28 MED ORDER — DULOXETINE HCL 60 MG PO CPEP: 120.0000 mg | ORAL_CAPSULE | Freq: Every day | ORAL | 2 refills | Status: DC

## 2017-04-28 MED ORDER — ZOLPIDEM TARTRATE ER 12.5 MG PO TBCR: 12.5000 mg | EXTENDED_RELEASE_TABLET | Freq: Every evening | ORAL | 1 refills | Status: DC | PRN
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
## 2017-05-09 ENCOUNTER — Ambulatory Visit (HOSPITAL_COMMUNITY): Payer: Self-pay | Admitting: Licensed Clinical Social Worker

## 2017-06-06 ENCOUNTER — Other Ambulatory Visit (HOSPITAL_COMMUNITY): Payer: Self-pay

## 2017-06-06 MED ORDER — ZALEPLON 10 MG PO CAPS: 10.0000 mg | ORAL_CAPSULE | Freq: Every day | ORAL | 0 refills | Status: DC

## 2017-06-30 ENCOUNTER — Encounter (HOSPITAL_COMMUNITY): Payer: Self-pay | Admitting: Psychiatry

## 2017-06-30 ENCOUNTER — Ambulatory Visit (INDEPENDENT_AMBULATORY_CARE_PROVIDER_SITE_OTHER): Payer: Medicare Other | Admitting: Psychiatry

## 2017-06-30 VITALS — BP 132/74 | HR 92 | Ht 66.0 in | Wt 226.0 lb

## 2017-06-30 DIAGNOSIS — F4001 Agoraphobia with panic disorder: Secondary | ICD-10-CM | POA: Diagnosis not present

## 2017-06-30 DIAGNOSIS — Z818 Family history of other mental and behavioral disorders: Secondary | ICD-10-CM

## 2017-06-30 DIAGNOSIS — F411 Generalized anxiety disorder: Secondary | ICD-10-CM

## 2017-06-30 DIAGNOSIS — Z79899 Other long term (current) drug therapy: Secondary | ICD-10-CM | POA: Diagnosis not present

## 2017-06-30 DIAGNOSIS — Z813 Family history of other psychoactive substance abuse and dependence: Secondary | ICD-10-CM | POA: Diagnosis not present

## 2017-06-30 DIAGNOSIS — F5105 Insomnia due to other mental disorder: Secondary | ICD-10-CM | POA: Diagnosis not present

## 2017-06-30 DIAGNOSIS — Z634 Disappearance and death of family member: Secondary | ICD-10-CM

## 2017-06-30 DIAGNOSIS — F99 Mental disorder, not otherwise specified: Secondary | ICD-10-CM

## 2017-06-30 DIAGNOSIS — F1099 Alcohol use, unspecified with unspecified alcohol-induced disorder: Secondary | ICD-10-CM

## 2017-06-30 DIAGNOSIS — Z811 Family history of alcohol abuse and dependence: Secondary | ICD-10-CM | POA: Diagnosis not present

## 2017-06-30 DIAGNOSIS — R450 Nervousness: Secondary | ICD-10-CM

## 2017-06-30 DIAGNOSIS — F333 Major depressive disorder, recurrent, severe with psychotic symptoms: Secondary | ICD-10-CM

## 2017-06-30 DIAGNOSIS — F4010 Social phobia, unspecified: Secondary | ICD-10-CM | POA: Diagnosis not present

## 2017-06-30 MED ORDER — ARIPIPRAZOLE 15 MG PO TABS: 7.5000 mg | ORAL_TABLET | Freq: Every day | ORAL | 1 refills | Status: DC

## 2017-06-30 MED ORDER — CLONAZEPAM 0.5 MG PO TABS: 0.5000 mg | ORAL_TABLET | Freq: Two times a day (BID) | ORAL | 1 refills | Status: DC | PRN

## 2017-06-30 MED ORDER — DULOXETINE HCL 30 MG PO CPEP: 90.0000 mg | ORAL_CAPSULE | Freq: Every day | ORAL | 1 refills | Status: DC

## 2017-06-30 MED ORDER — ZALEPLON 10 MG PO CAPS: ORAL_CAPSULE | ORAL | 1 refills | Status: DC
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
## 2017-07-08 ENCOUNTER — Other Ambulatory Visit: Payer: Self-pay | Admitting: Neurology

## 2017-07-19 ENCOUNTER — Ambulatory Visit: Payer: Medicare Other | Admitting: Neurology

## 2017-07-20 ENCOUNTER — Ambulatory Visit (HOSPITAL_COMMUNITY): Payer: Self-pay | Admitting: Licensed Clinical Social Worker

## 2017-07-25 ENCOUNTER — Telehealth: Payer: Self-pay | Admitting: Neurology

## 2017-07-25 NOTE — Telephone Encounter (Signed)
[facility] called requesting a call back at [phone] to discuss the PA for Botox, stating they had already faxed it over but will also need a call

## 2017-07-27 NOTE — Telephone Encounter (Signed)
I have sent this information over to the pharmacy.

## 2017-08-01 ENCOUNTER — Ambulatory Visit: Payer: Medicare Other | Admitting: Neurology

## 2017-08-01 ENCOUNTER — Telehealth: Payer: Self-pay | Admitting: Neurology

## 2017-08-01 ENCOUNTER — Telehealth: Payer: Self-pay | Admitting: *Deleted

## 2017-08-01 NOTE — Telephone Encounter (Signed)
Pt canceled 11:30 appt on same day 08/01/2017

## 2017-08-01 NOTE — Telephone Encounter (Signed)
Pt has taken sick this morning and will not be able to make today's Botox appointment but she would like to be called re: when she can r/s

## 2017-08-02 NOTE — Telephone Encounter (Signed)
I called to schedule the patient, she did not answer so I left a VM asking her to call back.  °

## 2017-08-04 ENCOUNTER — Encounter: Payer: Self-pay | Admitting: Neurology

## 2017-08-08 ENCOUNTER — Encounter: Payer: Self-pay | Admitting: Neurology

## 2017-08-08 ENCOUNTER — Ambulatory Visit (INDEPENDENT_AMBULATORY_CARE_PROVIDER_SITE_OTHER): Payer: Medicare Other | Admitting: Neurology

## 2017-08-08 ENCOUNTER — Telehealth: Payer: Self-pay | Admitting: Neurology

## 2017-08-08 VITALS — BP 109/76 | HR 88

## 2017-08-08 DIAGNOSIS — G43711 Chronic migraine without aura, intractable, with status migrainosus: Secondary | ICD-10-CM

## 2017-08-08 NOTE — Progress Notes (Signed)
Botox- 100 units x 2 vials Lot: [id number] Expiration: 01/2020 NDC: 6294-7654-65  Bacteriostatic 0.9% Sodium Chloride- 49mL total Lot: [id number] Expiration: 09/22/2018 NDC: 6812-7517-00  Dx: F74.944 B/B

## 2017-08-08 NOTE — Progress Notes (Signed)
Interval history 08/08/2016: Today will be our second botox. Daily migraines and failed multiple medications. Did feel improved, headaches worsened near the end of botox cycle, asked to keep migraine diary. +temples, masseters, lateral eyes. From daily migraines to 11 migraine days a month, >50% improvement .   Consent Form Botulism Toxin Injection For Chronic Migraine  Botulism toxin has been approved by the Federal drug administration for treatment of chronic migraine. Botulism toxin does not cure chronic migraine and it may not be effective in some patients.  The administration of botulism toxin is accomplished by injecting a small amount of toxin into the muscles of the neck and head. Dosage must be titrated for each individual. Any benefits resulting from botulism toxin tend to wear off after 3 months with a repeat injection required if benefit is to be maintained. Injections are usually done every 3-4 months with maximum effect peak achieved by about 2 or 3 weeks. Botulism toxin is expensive and you should be sure of what costs you will incur resulting from the injection.  The side effects of botulism toxin use for chronic migraine may include:   -Transient, and usually mild, facial weakness with facial injections  -Transient, and usually mild, head or neck weakness with head/neck injections  -Reduction or loss of forehead facial animation due to forehead muscle              weakness  -Eyelid drooping  -Dry eye  -Pain at the site of injection or bruising at the site of injection  -Double vision  -Potential unknown long term risks  Contraindications: You should not have Botox if you are pregnant, nursing, allergic to albumin, have an infection, skin condition, or muscle weakness at the site of the injection, or have myasthenia gravis, Lambert-Eaton syndrome, or ALS.  It is also possible that as with any injection, there may be an allergic reaction or no effect from the medication.  Reduced effectiveness after repeated injections is sometimes seen and rarely infection at the injection site may occur. All care will be taken to prevent these side effects. If therapy is given over a long time, atrophy and wasting in the muscle injected may occur. Occasionally the patient's become refractory to treatment because they develop antibodies to the toxin. In this event, therapy needs to be modified.  I have read the above information and consent to the administration of botulism toxin.    ______________  _____   _________________  Patient signature     Date   Witness signature       BOTOX PROCEDURE NOTE FOR MIGRAINE HEADACHE    Contraindications and precautions discussed with patient(above). Aseptic procedure was observed and patient tolerated procedure. Procedure performed by Dr. Artemio Aly  The condition has existed for more than 6 months, and pt does not have a diagnosis of ALS, Myasthenia Gravis or Lambert-Eaton Syndrome. Risks and benefits of injections discussed and pt agrees to proceed with the procedure. Written consent obtained  These injections are medically necessary. He receives good benefits from these injections. These injections do not cause sedations or hallucinations which the oral therapies may cause.  Indication/Diagnosis: chronic migraine BOTOX(J0585) injection was performed according to protocol by [employer]. 200 units of BOTOX was dissolved into 4 cc NS.  NDC: [address]-18   Description of procedure:  The patient was placed in a sitting position. The standard protocol was used for Botox as follows, with 5 units of Botox injected at each site:   -Procerus muscle,  midline injection  -Corrugator muscle, bilateral injection  -Frontalis muscle, bilateral injection, with 2 sites each side, medial injection was performed in the upper one third of the frontalis muscle, in the region vertical from the medial inferior edge of the superior orbital rim.  The lateral injection was again in the upper one third of the forehead vertically above the lateral limbus of the cornea, 1.5 cm lateral to the medial injection site.  -Temporalis muscle injection, 4 sites, bilaterally. The first injection was 3 cm above the tragus of the ear, second injection site was 1.5 cm to 3 cm up from the first injection site in line with the tragus of the ear. The third injection site was 1.5-3 cm forward between the first 2 injection sites. The fourth injection site was 1.5 cm posterior to the second injection site.  -Occipitalis muscle injection, 3 sites, bilaterally. The first injection was done one half way between the occipital protuberance and the tip of the mastoid process behind the ear. The second injection site was done lateral and superior to the first, 1 fingerbreadth from the first injection. The third injection site was 1 fingerbreadth superiorly and medially from the first injection site.  -Cervical paraspinal muscle injection, 2 sites, bilateral knee first injection site was 1 cm from the midline of the cervical spine, 3 cm inferior to the lower border of the occipital protuberance. The second injection site was 1.5 cm superiorly and laterally to the first injection site.  -Trapezius muscle injection was performed at 3 sites, bilaterally. The first injection site was in the upper trapezius muscle halfway between the inflection point of the neck, and the acromion. The second injection site was one half way between the acromion and the first injection site. The third injection was done between the first injection site and the inflection point of the neck.   Will return for repeat injection in 3 months.   A 200 unit sof Botox was used, 155 units were injected, the rest of the Botox was wasted. The patient tolerated the procedure well, there were no complications of the above procedure.

## 2017-08-08 NOTE — Telephone Encounter (Signed)
12 week botox

## 2017-08-09 NOTE — Telephone Encounter (Signed)
I called and scheduled the patient.  °

## 2017-08-11 ENCOUNTER — Ambulatory Visit (HOSPITAL_COMMUNITY): Payer: Medicare Other | Admitting: Psychiatry

## 2017-09-06 ENCOUNTER — Telehealth: Payer: Self-pay | Admitting: *Deleted

## 2017-09-06 NOTE — Telephone Encounter (Signed)
Received letter from [facility] that Topiramate 50 mg tablet is under a quantity limit. According to med list, pt is no longer on Topiramate, d/c on 08/08/17. Called pt. She confirmed that she is no longer taking Topiramate.

## 2017-09-29 ENCOUNTER — Encounter: Payer: Self-pay | Admitting: Physical Therapy

## 2017-09-29 ENCOUNTER — Ambulatory Visit: Payer: Medicare HMO | Attending: Family Medicine | Admitting: Physical Therapy

## 2017-09-29 DIAGNOSIS — M25572 Pain in left ankle and joints of left foot: Secondary | ICD-10-CM | POA: Insufficient documentation

## 2017-09-29 DIAGNOSIS — R600 Localized edema: Secondary | ICD-10-CM | POA: Diagnosis present

## 2017-09-29 DIAGNOSIS — M25672 Stiffness of left ankle, not elsewhere classified: Secondary | ICD-10-CM | POA: Diagnosis present

## 2017-09-29 DIAGNOSIS — R262 Difficulty in walking, not elsewhere classified: Secondary | ICD-10-CM

## 2017-09-29 NOTE — Therapy (Signed)
[facility] [place] [facility] [address] [place], [place], [address] Phone: [phone]   Fax:  [phone]  Physical Therapy Evaluation  Patient Details  Name: [name] MRN: [id number] Date of Birth: 1966-01-14 Referring Provider: Althea Charon   Encounter Date: 09/29/2017  PT End of Session - 09/29/17 1649    Visit Number  1    Number of Visits  --    Date for PT Re-Evaluation  11/29/17    Authorization Type  --    PT Start Time  1610    PT Stop Time  1655    PT Time Calculation (min)  45 min    Activity Tolerance  Patient limited by pain    Behavior During Therapy  [facility] for tasks assessed/performed       Past Medical History:  Diagnosis Date  . Anxiety   . Asthma   . Depression   . Environmental allergies   . Fibromyalgia   . GERD (gastroesophageal reflux disease)   . Grave's disease   . Headache    migraines  . High cholesterol   . Hypertension   . Sciatica   . Syncope and collapse   . Vasovagal syncope   . Vertigo     Past Surgical History:  Procedure Laterality Date  . ABDOMINAL HYSTERECTOMY  2006   partial  . GASTRIC BYPASS  2008    There were no vitals filed for this visit.   Subjective Assessment - 09/29/17 1616    Subjective  Patient reports that she was caring for a loved one and feels like she was doing a lot more, she reports that she has had left ankle and achilles pain for about 6 months, an x-ray shows some spurring.  She was in a boot fot 5-6 weeks.  She reports that this helped some.  She now has a brace    Pertinent History  fibromyalgia, sciatica    Limitations  Lifting;Walking    Patient Stated Goals  have less pain, walk better    Currently in Pain?  Yes    Pain Score  5     Pain Location  Ankle    Pain Orientation  Left    Pain Descriptors / Indicators  Aching    Pain Type  Acute pain    Pain Onset  More than a month ago    Pain Frequency  Intermittent    Aggravating Factors    walking, standing pain is up to 9/10    Pain Relieving Factors  prop leg up, with pain medication pain can be 0/10         [facility] PT Assessment - 09/29/17 0001      Assessment   Medical Diagnosis  left achilles pain    Referring Provider  Althea Charon    Onset Date/Surgical Date  08/10/17    Prior Therapy  no      Precautions   Precautions  None      Balance Screen   Has the patient fallen in the past 6 months  No    Has the patient had a decrease in activity level because of a fear of falling?   No    Is the patient reluctant to leave their home because of a fear of falling?   No      Home Environment   Additional Comments  no stairs, has help with housework      Prior Function   Level of Independence  Independent    Vocation  On disability    Vocation Requirements  scitica, vertigo, depression    Leisure  no exercise      ROM / Strength   AROM / PROM / Strength  AROM;Strength      AROM   AROM Assessment Site  Ankle    Right/Left Ankle  Left    Left Ankle Dorsiflexion  0    Left Ankle Plantar Flexion  29    Left Ankle Inversion  8    Left Ankle Eversion  0      Strength   Overall Strength Comments  3+/5 with pain in the ankle, reports pain in the "whole leg " with motions and with MMT      Flexibility   Soft Tissue Assessment /Muscle Length  -- very tight calves      Palpation   Palpation comment  she is very tender and gaurded with all movements, she had pain with any palaption of the ankle and achilles and into the calf, she does report "      Ambulation/Gait   Gait Comments  wears a lace up brace , toe out gait, slow, antalgic on the left, no device, very slow, slight trendelenberg on the left                Objective measurements completed on examination: See above findings.              PT Education - 09/29/17 1649    Education provided  Yes    Education Details  ankle pumps, ankle circles and calf stretches    Person(s) Educated   Patient    Methods  Explanation;Demonstration;Tactile cues;Verbal cues;Handout    Comprehension  Returned demonstration;Verbalized understanding;Verbal cues required       PT Short Term Goals - 09/29/17 1705      PT SHORT TERM GOAL #1   Title  independent with initial HEP    Time  2    Period  Weeks    Status  New        PT Long Term Goals - 09/29/17 1705      PT LONG TERM GOAL #1   Title  report pain decreased 25%    Time  8    Period  Weeks    Status  New      PT LONG TERM GOAL #2   Title  increase left ankle DF to 8 degrees    Time  8    Period  Weeks    Status  New      PT LONG TERM GOAL #3   Title  walk without deviation    Time  8    Period  Weeks    Status  New      PT LONG TERM GOAL #4   Title  increase ankle AROM for eversion to 10 degrees    Time  8    Period  Weeks    Status  New             Plan - 09/29/17 1655    Clinical Impression Statement  Patient has left achilles and ankle pain, she reports that she was caring for a loved one and strained it.  She reports that she was put in a boot for 5-6 weeks.  She reports that it feels a little better.  She reports that she has complications of fibromyalgia and sciatica as well as severe depression.  She had very  little ROM activley of the left ankle, she was very gaurded and reported pain with palpation or any PROM.  She wears a lace up brace, she has a limp on the left side with toe out gait., the left ankle was swollen    Clinical Presentation  Evolving    Clinical Decision Making  Moderate    Rehab Potential  Fair    PT Frequency  2x / week    PT Duration  8 weeks    PT Treatment/Interventions  ADLs/Self Care Home Management;Cryotherapy;Electrical Stimulation;Iontophoresis 4mg /ml Dexamethasone;Moist Heat;Ultrasound;Gait training;Balance training;Therapeutic exercise;Therapeutic activities;Functional mobility training;Stair training;Patient/family education;Manual techniques;Vasopneumatic Device     PT Next Visit Plan  slowly start her moving    Consulted and Agree with Plan of Care  Patient       Patient will benefit from skilled therapeutic intervention in order to improve the following deficits and impairments:  Abnormal gait, Cardiopulmonary status limiting activity, Decreased activity tolerance, Decreased balance, Decreased mobility, Decreased strength, Increased edema, Impaired flexibility, Pain, Increased muscle spasms, Difficulty walking, Decreased range of motion  Visit Diagnosis: Pain in left ankle and joints of left foot - Plan: PT plan of care cert/re-cert  Localized edema - Plan: PT plan of care cert/re-cert  Difficulty in walking, not elsewhere classified - Plan: PT plan of care cert/re-cert  Stiffness of left ankle, not elsewhere classified - Plan: PT plan of care cert/re-cert     Problem List Patient Active Problem List   Diagnosis Date Noted  . Intractable chronic migraine without aura and without status migrainosus 04/27/2016  . Intractable chronic migraine without aura and with status migrainosus 01/14/2016  . Insomnia 05/27/2015  . Bipolar 2 disorder (HCC) 02/25/2015  . 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 Severe 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 (HCC) 10/10/2014  . GAD (generalized anxiety disorder) 10/10/2014  . Panic disorder with agoraphobia 10/10/2014  . Social anxiety disorder 10/10/2014  . Other complicated headache syndrome 09/24/2014  . Jaw pain-acute TMJ 09/24/14 09/24/2014  . Protein-calorie malnutrition, severe (HCC) 11/03/2012  . Severe malnutrition (HCC) 11/03/2012  . Lethargy 11/01/2012  . Drug overdose, multiple drugs 11/01/2012  . Hypokalemia 11/01/2012  . Depression 11/22/2011  . Chronic pain 11/22/2011  . Grave's disease   . Asthma   . GERD (gastroesophageal reflux disease)     01/23/2012., PT 09/29/2017, 5:37 PM  [facility] [place] [facility] [address] [place], [place], [place] Phone: [phone]   Fax:  [phone]  Name: [name] MRN: [name] Date of Birth: 1965-09-13

## 2017-10-07 ENCOUNTER — Ambulatory Visit: Payer: Medicare HMO | Admitting: Physical Therapy

## 2017-10-07 ENCOUNTER — Encounter: Payer: Self-pay | Admitting: Physical Therapy

## 2017-10-07 DIAGNOSIS — M25572 Pain in left ankle and joints of left foot: Secondary | ICD-10-CM

## 2017-10-07 DIAGNOSIS — R600 Localized edema: Secondary | ICD-10-CM

## 2017-10-07 DIAGNOSIS — R262 Difficulty in walking, not elsewhere classified: Secondary | ICD-10-CM

## 2017-10-07 DIAGNOSIS — M25672 Stiffness of left ankle, not elsewhere classified: Secondary | ICD-10-CM

## 2017-10-07 NOTE — Therapy (Signed)
[facility] [place] [facility] [address] [place], [place], [address] Phone: [phone]   Fax:  [phone]  Physical Therapy Treatment  Patient Details  Name: [name] MRN: [id number] Date of Birth: [birth year] Referring Provider: Althea Charon   Encounter Date: 10/07/2017  PT End of Session - 10/07/17 1211    Visit Number  2    Date for PT Re-Evaluation  11/29/17    PT Start Time  1015    PT Stop Time  1115    PT Time Calculation (min)  60 min    Activity Tolerance  Patient limited by pain    Behavior During Therapy  [facility] for tasks assessed/performed       Past Medical History:  Diagnosis Date   Anxiety    Asthma    Depression    Environmental allergies    Fibromyalgia    GERD (gastroesophageal reflux disease)    Grave's disease    Headache    migraines   High cholesterol    Hypertension    Sciatica    Syncope and collapse    Vasovagal syncope    Vertigo     Past Surgical History:  Procedure Laterality Date   ABDOMINAL HYSTERECTOMY  2006   partial   GASTRIC BYPASS  2008    There were no vitals filed for this visit.  Subjective Assessment - 10/07/17 1032    Subjective  Cont with considerable ankle and Achilles pain and tightness.  Increased pain with increased standing and walking activity.  Mild relief with AROM ankle circumduction.      Pain Score  7     Pain Location  Ankle    Pain Orientation  Left                       [facility] Adult PT Treatment/Exercise - 10/07/17 0001      Exercises   Exercises  Ankle      Modalities   Modalities  Cryotherapy;Ultrasound      Cryotherapy   Number Minutes Cryotherapy  15 Minutes    Cryotherapy Location  Ankle    Type of Cryotherapy  Ice pack      Ultrasound   Ultrasound Location  Left Achilles    Ultrasound Parameters  50% @1 .2w/cm2    Ultrasound Goals  Pain      Manual Therapy   Manual Therapy  Soft tissue  mobilization;Passive ROM    Manual therapy comments  PAINFUL    Soft tissue mobilization  GENTLE to Achilles, Gastroc, Soleus complex    Passive ROM  4 way ankle, toe flex/ ext      Ankle Exercises: Stretches   Plantar Fascia Stretch  3 reps;30 seconds    Plantar Fascia Stretch Limitations  PAIN    Soleus Stretch  3 reps;30 seconds    Soleus Stretch Limitations  PAIN    Gastroc Stretch  3 reps;30 seconds    Gastroc Stretch Limitations  PAIN      Ankle Exercises: Seated   Heel Raises  Left;15 reps    Heel Raises Limitations  PAIN    Toe Raise  15 reps;2 seconds    Toe Raise Limitations  PAIN    Other Seated Ankle Exercises  AIREX foam heel/ toe press 12x5"ea               PT Short Term Goals - 09/29/17 1705      PT SHORT TERM  GOAL #1   Title  independent with initial HEP    Time  2    Period  Weeks    Status  New        PT Long Term Goals - 09/29/17 1705      PT LONG TERM GOAL #1   Title  report pain decreased 25%    Time  8    Period  Weeks    Status  New      PT LONG TERM GOAL #2   Title  increase left ankle DF to 8 degrees    Time  8    Period  Weeks    Status  New      PT LONG TERM GOAL #3   Title  walk without deviation    Time  8    Period  Weeks    Status  New      PT LONG TERM GOAL #4   Title  increase ankle AROM for eversion to 10 degrees    Time  8    Period  Weeks    Status  New            Plan - 10/07/17 1211    Clinical Impression Statement  Significant Left Achilles, gastroc, and soleus pain.  Poor tolerance to manual techniques and stretches d/t increased pain.  Considerably limited ankle dorsiflexion and Achilles flexibility d/t pain.  All strengthening performed in sitting d/t decresaed standing activity tolerance.        Patient will benefit from skilled therapeutic intervention in order to improve the following deficits and impairments:     Visit Diagnosis: Pain in left ankle and joints of left foot  Localized  edema  Difficulty in walking, not elsewhere classified  Stiffness of left ankle, not elsewhere classified     Problem List Patient Active Problem List   Diagnosis Date Noted   Intractable chronic migraine without aura and without status migrainosus 04/27/2016   Intractable chronic migraine without aura and with status migrainosus 01/14/2016   Insomnia 05/27/2015   Bipolar 2 disorder (HCC) 02/25/2015   Severe depressed bipolar II disorder without psychotic features (HCC) 02/25/2015   Severe 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 (HCC) 10/10/2014   GAD (generalized anxiety disorder) 10/10/2014   Panic disorder with agoraphobia 10/10/2014   Social anxiety disorder 10/10/2014   Other complicated headache syndrome 09/24/2014   Jaw pain-acute TMJ 09/24/14 09/24/2014   Protein-calorie malnutrition, severe (HCC) 11/03/2012   Severe malnutrition (HCC) 11/03/2012   Lethargy 11/01/2012   Drug overdose, multiple drugs 11/01/2012   Hypokalemia 11/01/2012   Depression 11/22/2011   Chronic pain 11/22/2011   Grave's disease    Asthma    GERD (gastroesophageal reflux disease)     [name], PTA 10/07/2017, 12:14 PM  [facility] [place] [facility] [address] [place], [place], [address] Phone: [phone]   Fax:  [phone]  Name: [name] MRN: [id number] Date of Birth: [birth year]

## 2017-10-09 ENCOUNTER — Other Ambulatory Visit (HOSPITAL_COMMUNITY): Payer: Self-pay | Admitting: Psychiatry

## 2017-10-09 DIAGNOSIS — F333 Major depressive disorder, recurrent, severe with psychotic symptoms: Secondary | ICD-10-CM

## 2017-10-09 DIAGNOSIS — F4001 Agoraphobia with panic disorder: Secondary | ICD-10-CM

## 2017-10-09 DIAGNOSIS — F4010 Social phobia, unspecified: Secondary | ICD-10-CM

## 2017-10-09 DIAGNOSIS — F411 Generalized anxiety disorder: Secondary | ICD-10-CM

## 2017-10-10 ENCOUNTER — Ambulatory Visit: Payer: Medicare HMO

## 2017-10-18 ENCOUNTER — Ambulatory Visit: Payer: Medicare HMO

## 2017-10-20 ENCOUNTER — Ambulatory Visit: Payer: Medicare HMO

## 2017-10-25 ENCOUNTER — Telehealth: Payer: Self-pay | Admitting: Neurology

## 2017-10-25 NOTE — Telephone Encounter (Signed)
Patient has a Medicare supplement through [facility]. Medication will be B/B but we will need a reference number from [facility] stating that NPR is require, the patient is active and covered. Their phone number is on the back of the card.

## 2017-11-01 ENCOUNTER — Telehealth: Payer: Self-pay | Admitting: Neurology

## 2017-11-01 NOTE — Telephone Encounter (Signed)
Botox B/B -- insurance Berkley Harvey is pending with [employer] and medicaid [place] 11/01/2017. [place]

## 2017-11-07 NOTE — Telephone Encounter (Addendum)
Error

## 2017-11-07 NOTE — Telephone Encounter (Signed)
[employer] authorization approved for Botox [id number] (11/16/2019)

## 2017-11-14 NOTE — Telephone Encounter (Signed)
I called the patient to make her aware that Medicaid will not pick up any of the copay after [employer] pays. She would be responsible for around 200 dollars for her copay. The patient would like to cancel apt and discuss different treatment options. She would also like for me to make Dr. Lucia Gaskins aware that she is on day 11 of a migraine now. Please call and advise.

## 2017-11-15 NOTE — Telephone Encounter (Signed)
I would try to get Aimovig or Ajovy approved. Please discuss with patient. She is on so many daily oral medications and has failed a multiple of daily oral migraine medications that I think the new monthly injections would be best. If she agrees we can order Aimovig 140mg  monthly.

## 2017-11-16 ENCOUNTER — Ambulatory Visit: Payer: Medicare Other | Admitting: Neurology

## 2017-11-16 MED ORDER — ERENUMAB-AOOE 140 MG/ML ~~LOC~~ SOAJ: 140.0000 mg | SUBCUTANEOUS | 11 refills | Status: DC

## 2017-11-16 NOTE — Telephone Encounter (Signed)
Spoke with patient. She remembered discussing the new CGRP medications. She would like to try Aimovig 140 mg. Confirmed pt does not have latex allergy. She is aware that this injection is once every 30 days. She also stated that she is much better but she had a migraine for 11 days and got a Toradol shot from her doctor and had an MRI done on 6/19 @ [address] in [place]. She would like Dr. Lucia Gaskins to look at it. RN informed pt that it does not appear that we have the scan but will send a request for the imaging. She verbalized appreciation. She is also aware that there will be detailed instructions for the Aimovig injection. Also discussed general instructions (on site prep, hand hygiene, injection, and disposal) on the phone so she knows what to expect. She was encouraged to read the instructions before administration and can use pharmacy as a resource as well as our office and Aimovig if needed. She verbalized understanding and appreciation.   Aimovig 140 mg injection order placed per v.o. Dr. Lucia Gaskins.

## 2017-11-16 NOTE — Telephone Encounter (Signed)
Called pt & LVM asking for call back. Left office number in message.  °

## 2017-11-16 NOTE — Addendum Note (Signed)
Addended by: Bertram Savin on: 11/16/2017 10:52 AM   Modules accepted: Orders

## 2017-12-13 ENCOUNTER — Telehealth: Payer: Self-pay | Admitting: Neurology

## 2017-12-13 NOTE — Telephone Encounter (Signed)
Called pt back. Informed her that we received a PA request for Topamax due to quantity limit exceeded however it looks like Topamax has been d/c'd at last visit. Pt confirmed she is no longer taking this and she has been receiving calls from [facility] saying she's due for a refill. RN advised pt that she would call them and cancel prescription. Pt verbalized appreciation. Called [facility] and spoke with pharmacy rep. Canceled the Topiramate prescription. She verbalized understanding and will make a note it the pt's chart.

## 2017-12-13 NOTE — Telephone Encounter (Signed)
Pt has called stating that she was returning the call to [employer].  Please call

## 2017-12-24 ENCOUNTER — Encounter (HOSPITAL_COMMUNITY): Payer: Self-pay

## 2017-12-24 ENCOUNTER — Ambulatory Visit (HOSPITAL_COMMUNITY): Payer: Self-pay | Admitting: Psychiatry

## 2017-12-24 ENCOUNTER — Other Ambulatory Visit: Payer: Self-pay

## 2017-12-24 ENCOUNTER — Emergency Department (HOSPITAL_COMMUNITY): Payer: Medicare HMO

## 2017-12-24 ENCOUNTER — Emergency Department (HOSPITAL_COMMUNITY)
Admission: EM | Admit: 2017-12-24 | Discharge: 2017-12-24 | Disposition: A | Discharge status: Home / Self Care | Payer: Medicare HMO | Attending: Emergency Medicine | Admitting: Emergency Medicine

## 2017-12-24 DIAGNOSIS — J45909 Unspecified asthma, uncomplicated: Secondary | ICD-10-CM | POA: Diagnosis not present

## 2017-12-24 DIAGNOSIS — Z79899 Other long term (current) drug therapy: Secondary | ICD-10-CM | POA: Diagnosis not present

## 2017-12-24 DIAGNOSIS — I10 Essential (primary) hypertension: Secondary | ICD-10-CM | POA: Insufficient documentation

## 2017-12-24 DIAGNOSIS — M25571 Pain in right ankle and joints of right foot: Secondary | ICD-10-CM | POA: Insufficient documentation

## 2017-12-24 NOTE — ED Triage Notes (Signed)
She states that she went to her b.r. At ~0300 hours, at which time she fell asleep on her toilet. Upon awakening, she noted her right leg/foot to be "numb". Ever since this occurred, she has had ankle pain and "it turns in". She is wearing a cam walker that she had received for a left ankle injury last year.

## 2017-12-24 NOTE — ED Notes (Signed)
Pt took own Oxycodone 10 mg approx 5 minutes ago

## 2017-12-24 NOTE — Discharge Instructions (Addendum)

## 2017-12-24 NOTE — ED Provider Notes (Signed)
[facility]-EMERGENCY DEPT Provider Note   CSN: [id number] Arrival date & time: 12/24/17  1108     History   Chief Complaint Chief Complaint  Patient presents with  . Ankle Pain    HPI [name] is a 52 y.o. female.  HPI   Patient is a 52 year old female with a history of anxiety/depression, asthma, fibromyalgia, GERD, Graves' disease, hypertension, hypercholesterol dural anemia, hypertension, sciatica, MS, who presents the emergency today to be evaluated for right ankle pain, numbness, tingling that began last night.  Patient states that last night she took a sleeping pill and went to bed.  She woke up in the middle the night to use the restroom and fell asleep on the toilet for an unknown period of time.  She states that when she woke up her right lower extremity felt numb from the mid calf down to the foot.  Reports a burning sensation to the foot.  She also reports that she felt a pop when she was walking back from the restroom last night and feels like her ankle is turning in.  States she has severe and constant pain that is worse with ambulation.  Tried wearing a cam walker at home with mild relief.  She has not tried any taking any medications for her symptoms.  States that this does not feel like an MS flare to her.  She normally has symptoms in the left lower extremity has never had symptoms to the right lower extremity.  She states it feels more consistent with fibromyalgia.  Past Medical History:  Diagnosis Date  . Anxiety   . Asthma   . Depression   . Environmental allergies   . Fibromyalgia   . GERD (gastroesophageal reflux disease)   . Grave's disease   . Headache    migraines  . High cholesterol   . Hypertension   . Sciatica   . Syncope and collapse   . Vasovagal syncope   . Vertigo     Patient Active Problem List   Diagnosis Date Noted  . Intractable chronic migraine without aura and without status migrainosus 04/27/2016  .  Intractable chronic migraine without aura and with status migrainosus 01/14/2016  . Insomnia 05/27/2015  . Bipolar 2 disorder (HCC) 02/25/2015  . 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 Severe 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 (HCC) 10/10/2014  . GAD (generalized anxiety disorder) 10/10/2014  . Panic disorder with agoraphobia 10/10/2014  . Social anxiety disorder 10/10/2014  . Other complicated headache syndrome 09/24/2014  . Jaw pain-acute TMJ 09/24/14 09/24/2014  . Protein-calorie malnutrition, severe (HCC) 11/03/2012  . Severe malnutrition (HCC) 11/03/2012  . Lethargy 11/01/2012  . Drug overdose, multiple drugs 11/01/2012  . Hypokalemia 11/01/2012  . Depression 11/22/2011  . Chronic pain 11/22/2011  . Grave's disease   . Asthma   . GERD (gastroesophageal reflux disease)     Past Surgical History:  Procedure Laterality Date  . ABDOMINAL HYSTERECTOMY  2006   partial  . GASTRIC BYPASS  2008     OB History    Gravida  2   Para  2   Term      Preterm      AB      Living  2     SAB      TAB      Ectopic      Multiple      Live Births  Home Medications    Prior to Admission medications   Medication Sig Start Date End Date Taking? Authorizing Provider  albuterol (PROVENTIL HFA;VENTOLIN HFA) 108 (90 BASE) MCG/ACT inhaler Inhale 2 puffs into the lungs every 6 (six) hours as needed for wheezing.    [provider]  ARIPiprazole (ABILIFY) 15 MG tablet Take 0.5 tablets (7.5 mg total) by mouth daily. 06/30/17   Oletta Darter, MD  azelastine (ASTELIN) 137 MCG/SPRAY nasal spray Place 1 spray into the nose 2 (two) times daily. Use in each nostril as directed    [provider]  BIOTIN 5000 PO Take 5,000 Units by mouth daily.     [provider]  budesonide-formoterol (SYMBICORT) 80-4.5 MCG/ACT inhaler Inhale 2 puffs into the lungs 2 (two) times daily.     [provider]  cetirizine (ZYRTEC) 10 MG tablet Take 10 mg by mouth daily.    [provider]  cholecalciferol (VITAMIN D) 1000 UNITS tablet Take 1,000 Units by mouth daily.     [provider]  clonazePAM (KLONOPIN) 0.5 MG tablet Take 1 tablet (0.5 mg total) by mouth 2 (two) times daily as needed for anxiety. 06/30/17   Oletta Darter, MD  DULoxetine (CYMBALTA) 30 MG capsule Take 3 capsules (90 mg total) by mouth daily. 06/30/17   Oletta Darter, MD  Epinastine HCl (ELESTAT) 0.05 % ophthalmic solution Place 1 drop into both eyes 2 (two) times daily as needed (itching).     [provider]  EPINEPHrine (EPIPEN) 0.3 mg/0.3 mL DEVI Inject 0.3 mg into the muscle as needed. For allergic reaction to stinging insects, mushrooms, and shellfish     [provider]  Erenumab-aooe (AIMOVIG) 140 MG/ML SOAJ Inject 140 mg into the skin every 30 (thirty) days. 11/16/17   Anson Fret, MD  estradiol (ESTRACE) 0.5 MG tablet Take 0.5 mg by mouth daily.    [provider]  fluticasone (FLONASE) 50 MCG/ACT nasal spray Place 1 spray into the nose as needed.    [provider]  gabapentin (NEURONTIN) 300 MG capsule Take 300 mg by mouth 2 (two) times daily.    [provider]  ibuprofen (ADVIL,MOTRIN) 800 MG tablet Take 800 mg by mouth every 8 (eight) hours as needed for moderate pain.    [provider]  meclizine (ANTIVERT) 25 MG tablet Take 1 tablet (25 mg total) by mouth every 6 (six) hours as needed for dizziness. 10/12/13   Purvis Sheffield, MD  Menthol-Methyl Salicylate (BEN GAY GREASELESS) 10-15 % greaseless cream Apply 1 application topically 3 (three) times daily as needed for pain.    [provider]  mometasone-formoterol (DULERA) 100-5 MCG/ACT AERO Inhale 2 puffs into the lungs 2 (two) times daily as needed for wheezing or shortness of breath.     [provider]  montelukast (SINGULAIR) 10 MG tablet Take 1  tablet (10 mg total) by mouth at bedtime. 02/13/15   Danelle Berry, PA-C  Multiple Vitamins-Minerals (MULTIVITAMIN WITH MINERALS) tablet Take 1 tablet by mouth daily.    [provider]  oxyCODONE-acetaminophen (PERCOCET/ROXICET) 5-325 MG per tablet Take one every 6 hours if needed for cough 06/12/14   Peyton Najjar, MD  oxymetazoline (AFRIN NASAL SPRAY) 0.05 % nasal spray Place 1 spray into both nostrils 2 (two) times daily. 07/07/15   Barrett Henle, PA-C  polyethylene glycol [facility] / Ethelene Hal) packet Take 17 g by mouth daily.    [provider]  ranitidine (ZANTAC) 150 MG tablet Take 150  mg by mouth daily.    [provider]  SUMAtriptan (IMITREX) 100 MG tablet Take 1 tablet (100 mg total) by mouth once as needed for up to 1 dose for migraine. May repeat in 2 hours if headache persists or recurs. 04/12/17   Anson Fret, MD  vitamin B-12 (CYANOCOBALAMIN) 100 MCG tablet Take 100 mcg by mouth daily.    [provider]  zaleplon (SONATA) 10 MG capsule Take one at bedtime. May repeat if necessary 06/30/17   Oletta Darter, MD    Family History Family History  Problem Relation Age of Onset  . Hypertension Mother   . Cirrhosis Mother   . Alcohol abuse Mother   . Depression Mother   . Physical abuse Mother   . Hypertension Sister   . Alcohol abuse Sister   . Drug abuse Sister   . Depression Sister   . Anxiety disorder Sister   . OCD Sister   . Hypertension Father   . Alcohol abuse Father   . ADD / ADHD Other   . Depression Sister   . Diabetes Other   . Hypertension Other   . Diabetes Maternal Uncle   . Seizures Cousin     Social History Social History   Tobacco Use  . Smoking status: Never Smoker  . Smokeless tobacco: Never Used  Substance Use Topics  . Alcohol use: Yes    Alcohol/week: 0.6 oz    Types: 1 Glasses of wine per week    Comment: one glass of wine every 3 months  . Drug use: No     Allergies   Bee venom;  Coconut fatty acids; Mushroom ext cmplx(shiitake-reishi-mait); Nutritional supplements; Other; Shellfish allergy; Strawberry extract; and Hydrocodone-acetaminophen   Review of Systems Review of Systems  Constitutional: Negative for fever.  HENT: Negative for congestion.   Eyes: Negative for visual disturbance.  Respiratory: Negative for shortness of breath.   Cardiovascular: Negative for chest pain.  Gastrointestinal: Negative for abdominal pain.  Genitourinary: Negative for pelvic pain.  Musculoskeletal: Negative for back pain.       RLE pain/numbness/tingling  Skin: Negative for color change and wound.  Neurological: Positive for numbness. Negative for weakness.     Physical Exam Updated Vital Signs BP 118/82 (BP Location: Right Arm)   Pulse 95   Temp 97.8 F (36.6 C) (Oral)   Resp 18   Ht 5\' 6"  (1.676 m)   Wt 107 kg (236 lb)   SpO2 100%   BMI 38.09 kg/m   Physical Exam  Constitutional: She is oriented to person, place, and time. She appears well-developed and well-nourished. No distress.  HENT:  Head: Normocephalic and atraumatic.  Eyes: Conjunctivae are normal.  Neck: Neck supple.  Cardiovascular: Normal rate and regular rhythm.  Pulmonary/Chest: Effort normal and breath sounds normal.  Musculoskeletal: Normal range of motion.  TTP to the right lateral malleolus and to the distal fibula. No medial malleolar TTP. Achilles intact.   Neurological: She is alert and oriented to person, place, and time. No cranial nerve deficit.  Motor:  Normal tone. 5/5 strength of BUE and BLE major muscle groups including strong with exception of mildly decreased strength with dorsiflexion of the right foot. Sensory: abnormal sensation from right mid shin to right ankle/foot though sensation is intact throughout. RUE, LUE and LLL with normal sensation. Cerebellar: normal finger-to-nose with bilateral upper extremities CV: 2+ radial and DP pulses  Skin: Skin is warm and dry.    Psychiatric: She has  a normal mood and affect.  Nursing note and vitals reviewed.  ED Treatments / Results  Labs (all labs ordered are listed, but only abnormal results are displayed) Labs Reviewed - No data to display  EKG None  Radiology Dg Tibia/fibula Right  Result Date: 12/24/2017 CLINICAL DATA:  RIGHT ankle injury this morning, pain. EXAM: RIGHT TIBIA AND FIBULA - 2 VIEW COMPARISON:  None. FINDINGS: There is no evidence of fracture or other focal bone lesions. Soft tissues are unremarkable. IMPRESSION: Negative. Electronically Signed   By: Bary Richard M.D.   On: 12/24/2017 12:34   Dg Ankle Complete Right  Result Date: 12/24/2017 CLINICAL DATA:  Right ankle pain EXAM: RIGHT ANKLE - COMPLETE 3+ VIEW COMPARISON:  Right tibia and fibular radiographs-earlier same day FINDINGS: No fracture or dislocation. Joint spaces are preserved. Ankle mortise is preserved. No ankle joint effusion. Note is made of a small os peroneus. Regional soft tissues appear normal. No radiopaque foreign body. IMPRESSION: No explanation for patient's right ankle pain. Electronically Signed   By: Simonne Come M.D.   On: 12/24/2017 13:53    Procedures Procedures (including critical care time)  Medications Ordered in ED Medications - No data to display   Initial Impression / Assessment and Plan / ED Course  I have reviewed the triage vital signs and the nursing notes.  Pertinent labs & imaging results that were available during my care of the patient were reviewed by me and considered in my medical decision making (see chart for details).    Discussed pt presentation and exam findings with Dr. Estell Harpin, who evaluated the pt and agrees with the plan to give aso ankle splint and crutches and d/c pt.   Final Clinical Impressions(s) / ED Diagnoses   Final diagnoses:  Acute right ankle pain   Patient presenting with ankle pain/paresthesia after falling asleep while sitting on the on the toilet yesterday. Also  reports a popping sensation after walking back from the bathroom last night.  Vital signs stable and patient nontoxic-appearing.  X-ray of left ankle and left tib/fib negative for acute fracture abnormality. History not consistent with MS flare. Most consistent with either ankle sprain from twisting injury vs peripheral neuropathy from prolonged sitting on the commode. Will give aso splint and crutches and have pt f/u with her pcp in 1 week   for reevaluation.  Advised Tylenol, ibuprofen, and rice protocol for pain.  Advised to return to the ER for any new or worsening symptoms in the meantime.  All questions were answered and patient understands plan and reasons to return.  ED Discharge Orders    None       Rayne Du 12/24/17 2137    Bethann Berkshire, MD 12/25/17 [phone]
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
## 2017-12-28 ENCOUNTER — Ambulatory Visit (HOSPITAL_COMMUNITY): Payer: Self-pay | Admitting: Licensed Clinical Social Worker

## 2018-01-05 ENCOUNTER — Ambulatory Visit (HOSPITAL_COMMUNITY): Payer: Self-pay | Admitting: Psychiatry

## 2018-01-12 ENCOUNTER — Encounter (HOSPITAL_COMMUNITY): Payer: Self-pay | Admitting: Psychiatry

## 2018-01-12 ENCOUNTER — Ambulatory Visit (INDEPENDENT_AMBULATORY_CARE_PROVIDER_SITE_OTHER): Payer: Medicare HMO | Admitting: Psychiatry

## 2018-01-12 VITALS — BP 142/90 | HR 74 | Ht 66.0 in | Wt 235.0 lb

## 2018-01-12 DIAGNOSIS — F4010 Social phobia, unspecified: Secondary | ICD-10-CM

## 2018-01-12 DIAGNOSIS — F4001 Agoraphobia with panic disorder: Secondary | ICD-10-CM

## 2018-01-12 DIAGNOSIS — F411 Generalized anxiety disorder: Secondary | ICD-10-CM | POA: Diagnosis not present

## 2018-01-12 DIAGNOSIS — F333 Major depressive disorder, recurrent, severe with psychotic symptoms: Secondary | ICD-10-CM

## 2018-01-12 DIAGNOSIS — F99 Mental disorder, not otherwise specified: Secondary | ICD-10-CM

## 2018-01-12 DIAGNOSIS — F5105 Insomnia due to other mental disorder: Secondary | ICD-10-CM

## 2018-01-12 MED ORDER — CLONAZEPAM 0.5 MG PO TABS: 0.5000 mg | ORAL_TABLET | Freq: Two times a day (BID) | ORAL | 0 refills | Status: DC | PRN

## 2018-01-12 MED ORDER — DULOXETINE HCL 30 MG PO CPEP: 90.0000 mg | ORAL_CAPSULE | Freq: Every day | ORAL | 0 refills | Status: DC

## 2018-01-12 MED ORDER — ARIPIPRAZOLE 10 MG PO TABS: 10.0000 mg | ORAL_TABLET | Freq: Every day | ORAL | 0 refills | Status: DC

## 2018-01-12 MED ORDER — ZALEPLON 10 MG PO CAPS: ORAL_CAPSULE | ORAL | 0 refills | Status: DC
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
## 2018-01-14 ENCOUNTER — Telehealth (HOSPITAL_COMMUNITY): Payer: Self-pay

## 2018-01-14 NOTE — Telephone Encounter (Signed)
Pharmacy sent request stating that Zaleplon 10mg  is not covered by insurance. Please review and advise. Thanks.

## 2018-02-09 ENCOUNTER — Ambulatory Visit (HOSPITAL_COMMUNITY): Payer: Self-pay | Admitting: Psychiatry

## 2018-02-21 ENCOUNTER — Telehealth (HOSPITAL_COMMUNITY): Payer: Self-pay

## 2018-02-21 NOTE — Telephone Encounter (Signed)
Received a refill request for Clonazepam 0.5mg  tabs and Abilify 10mg  tabs.  Patient was last seen on 01-12-18 cancelled the 02-09-18 appt. Please advise

## 2018-02-26 ENCOUNTER — Emergency Department (HOSPITAL_COMMUNITY)
Admission: EM | Admit: 2018-02-26 | Discharge: 2018-02-26 | Disposition: A | Discharge status: Home / Self Care | Payer: Medicare HMO | Attending: Emergency Medicine | Admitting: Emergency Medicine

## 2018-02-26 ENCOUNTER — Encounter (HOSPITAL_COMMUNITY): Payer: Self-pay | Admitting: Emergency Medicine

## 2018-02-26 ENCOUNTER — Other Ambulatory Visit: Payer: Self-pay

## 2018-02-26 DIAGNOSIS — Z79899 Other long term (current) drug therapy: Secondary | ICD-10-CM | POA: Diagnosis not present

## 2018-02-26 DIAGNOSIS — J45909 Unspecified asthma, uncomplicated: Secondary | ICD-10-CM | POA: Insufficient documentation

## 2018-02-26 DIAGNOSIS — I10 Essential (primary) hypertension: Secondary | ICD-10-CM | POA: Diagnosis not present

## 2018-02-26 DIAGNOSIS — M79674 Pain in right toe(s): Secondary | ICD-10-CM | POA: Diagnosis present

## 2018-02-26 DIAGNOSIS — E079 Disorder of thyroid, unspecified: Secondary | ICD-10-CM | POA: Insufficient documentation

## 2018-02-26 NOTE — ED Provider Notes (Signed)
[facility]-EMERGENCY DEPT Provider Note   CSN: [id number] Arrival date & time: 02/26/18  0459     History   Chief Complaint Chief Complaint  Patient presents with  . Foot Pain    HPI [name] is a 52 y.o. female.  The history is provided by the patient and medical records.  Foot Pain      52 y.o. F with hx of anxiety, asthma, depression, GERD, grave's disease, HTN, presenting to the ED for right great toe pain.  States present upon waking, denies known injury or trauma.  Concerned she may have been bitten by something but never saw it.  Denies fever/chills.  No numbness/weakness.  She is still ambulatory.  She did take some benadryl, swelling seems to have improved.  Past Medical History:  Diagnosis Date  . Anxiety   . Asthma   . Depression   . Environmental allergies   . Fibromyalgia   . GERD (gastroesophageal reflux disease)   . Grave's disease   . Headache    migraines  . High cholesterol   . Hypertension   . Sciatica   . Syncope and collapse   . Vasovagal syncope   . Vertigo     Patient Active Problem List   Diagnosis Date Noted  . Intractable chronic migraine without aura and without status migrainosus 04/27/2016  . Intractable chronic migraine without aura and with status migrainosus 01/14/2016  . Insomnia 05/27/2015  . Bipolar 2 disorder (HCC) 02/25/2015  . 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 Severe 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 (HCC) 10/10/2014  . GAD (generalized anxiety disorder) 10/10/2014  . Panic disorder with agoraphobia 10/10/2014  . Social anxiety disorder 10/10/2014  . Other complicated headache syndrome 09/24/2014  . Jaw pain-acute TMJ 09/24/14 09/24/2014  . Protein-calorie malnutrition, severe (HCC) 11/03/2012  . Severe malnutrition (HCC) 11/03/2012  . Lethargy 11/01/2012  . Drug overdose, multiple drugs 11/01/2012  . Hypokalemia  11/01/2012  . Depression 11/22/2011  . Chronic pain 11/22/2011  . Grave's disease   . Asthma   . GERD (gastroesophageal reflux disease)     Past Surgical History:  Procedure Laterality Date  . ABDOMINAL HYSTERECTOMY  2006   partial  . GASTRIC BYPASS  2008     OB History    Gravida  2   Para  2   Term      Preterm      AB      Living  2     SAB      TAB      Ectopic      Multiple      Live Births               Home Medications    Prior to Admission medications   Medication Sig Start Date End Date Taking? Authorizing Provider  albuterol (PROVENTIL HFA;VENTOLIN HFA) 108 (90 BASE) MCG/ACT inhaler Inhale 2 puffs into the lungs every 6 (six) hours as needed for wheezing.    [provider]  ARIPiprazole (ABILIFY) 10 MG tablet Take 1 tablet (10 mg total) by mouth daily. 01/12/18   Oletta Darter, MD  azelastine (ASTELIN) 137 MCG/SPRAY nasal spray Place 1 spray into the nose 2 (two) times daily. Use in each nostril as directed    [provider]  BIOTIN 5000 PO Take 5,000 Units by mouth daily.     [provider]  budesonide-formoterol (SYMBICORT) 80-4.5 MCG/ACT  inhaler Inhale 2 puffs into the lungs 2 (two) times daily.    [provider]  cetirizine (ZYRTEC) 10 MG tablet Take 10 mg by mouth daily.    [provider]  cholecalciferol (VITAMIN D) 1000 UNITS tablet Take 1,000 Units by mouth daily.     [provider]  clonazePAM (KLONOPIN) 0.5 MG tablet Take 1 tablet (0.5 mg total) by mouth 2 (two) times daily as needed for anxiety. 01/12/18   Oletta Darter, MD  DULoxetine (CYMBALTA) 30 MG capsule Take 3 capsules (90 mg total) by mouth daily. 01/12/18   Oletta Darter, MD  Epinastine HCl (ELESTAT) 0.05 % ophthalmic solution Place 1 drop into both eyes 2 (two) times daily as needed (itching).     [provider]  EPINEPHrine (EPIPEN) 0.3 mg/0.3 mL DEVI Inject 0.3 mg into the muscle as needed. For allergic  reaction to stinging insects, mushrooms, and shellfish     [provider]  Erenumab-aooe (AIMOVIG) 140 MG/ML SOAJ Inject 140 mg into the skin every 30 (thirty) days. 11/16/17   Anson Fret, MD  estradiol (ESTRACE) 0.5 MG tablet Take 0.5 mg by mouth daily.    [provider]  fluticasone (FLONASE) 50 MCG/ACT nasal spray Place 1 spray into the nose as needed.    [provider]  gabapentin (NEURONTIN) 300 MG capsule Take 300 mg by mouth 2 (two) times daily.    [provider]  ibuprofen (ADVIL,MOTRIN) 800 MG tablet Take 800 mg by mouth every 8 (eight) hours as needed for moderate pain.    [provider]  meclizine (ANTIVERT) 25 MG tablet Take 1 tablet (25 mg total) by mouth every 6 (six) hours as needed for dizziness. 10/12/13   Purvis Sheffield, MD  Menthol-Methyl Salicylate (BEN GAY GREASELESS) 10-15 % greaseless cream Apply 1 application topically 3 (three) times daily as needed for pain.    [provider]  mometasone-formoterol (DULERA) 100-5 MCG/ACT AERO Inhale 2 puffs into the lungs 2 (two) times daily as needed for wheezing or shortness of breath.     [provider]  montelukast (SINGULAIR) 10 MG tablet Take 1 tablet (10 mg total) by mouth at bedtime. 02/13/15   Danelle Berry, PA-C  Multiple Vitamins-Minerals (MULTIVITAMIN WITH MINERALS) tablet Take 1 tablet by mouth daily.    [provider]  oxyCODONE-acetaminophen (PERCOCET/ROXICET) 5-325 MG per tablet Take one every 6 hours if needed for cough 06/12/14   Peyton Najjar, MD  oxymetazoline (AFRIN NASAL SPRAY) 0.05 % nasal spray Place 1 spray into both nostrils 2 (two) times daily. 07/07/15   Barrett Henle, PA-C  polyethylene glycol [facility] / Ethelene Hal) packet Take 17 g by mouth daily.    [provider]  ranitidine (ZANTAC) 150 MG tablet Take 150 mg by mouth daily.    [provider]  SUMAtriptan (IMITREX) 100 MG tablet Take 1 tablet (100  mg total) by mouth once as needed for up to 1 dose for migraine. May repeat in 2 hours if headache persists or recurs. 04/12/17   Anson Fret, MD  vitamin B-12 (CYANOCOBALAMIN) 100 MCG tablet Take 100 mcg by mouth daily.    [provider]  zaleplon (SONATA) 10 MG capsule Take one at bedtime. May repeat if necessary 01/12/18   Oletta Darter, MD    Family History Family History  Problem Relation Age of Onset  . Hypertension Mother   . Cirrhosis Mother   . Alcohol abuse Mother   . Depression  Mother   . Physical abuse Mother   . Hypertension Sister   . Alcohol abuse Sister   . Drug abuse Sister   . Depression Sister   . Anxiety disorder Sister   . OCD Sister   . Hypertension Father   . Alcohol abuse Father   . ADD / ADHD Other   . Depression Sister   . Diabetes Other   . Hypertension Other   . Diabetes Maternal Uncle   . Seizures Cousin     Social History Social History   Tobacco Use  . Smoking status: Never Smoker  . Smokeless tobacco: Never Used  Substance Use Topics  . Alcohol use: Yes    Alcohol/week: 1.0 standard drinks    Types: 1 Glasses of wine per week    Comment: one glass of wine every 3 months  . Drug use: No     Allergies   Bee venom; Coconut fatty acids; Mushroom ext cmplx(shiitake-reishi-mait); Nutritional supplements; Other; Shellfish allergy; Strawberry extract; and Hydrocodone-acetaminophen   Review of Systems Review of Systems  Musculoskeletal: Positive for arthralgias.  All other systems reviewed and are negative.    Physical Exam Updated Vital Signs BP 137/90 (BP Location: Right Arm)   Pulse 88   Temp 97.9 F (36.6 C) (Oral)   Resp 16   Ht 5\' 6"  (1.676 m)   Wt 104.3 kg   SpO2 98%   BMI 37.12 kg/m   Physical Exam  Constitutional: She is oriented to person, place, and time. She appears well-developed and well-nourished.  HENT:  Head: Normocephalic and atraumatic.  Mouth/Throat: Oropharynx is clear and moist.    Eyes: Pupils are equal, round, and reactive to light. Conjunctivae and EOM are normal.  Neck: Normal range of motion.  Cardiovascular: Normal rate, regular rhythm and normal heart sounds.  Pulmonary/Chest: Effort normal and breath sounds normal. No stridor. No respiratory distress.  Abdominal: Soft. Bowel sounds are normal. There is no tenderness. There is no rebound.  Musculoskeletal: Normal range of motion.  Right great toe with 0.5cm area of erythema to dorsal aspect; no swelling or signs of infection; no drainage; foot is NVI; normal distal sensation and cap refill  Neurological: She is alert and oriented to person, place, and time.  Skin: Skin is warm and dry.  Psychiatric: She has a normal mood and affect.  Nursing note and vitals reviewed.    ED Treatments / Results  Labs (all labs ordered are listed, but only abnormal results are displayed) Labs Reviewed - No data to display  EKG None  Radiology No results found.  Procedures Procedures (including critical care time)  Medications Ordered in ED Medications - No data to display   Initial Impression / Assessment and Plan / ED Course  I have reviewed the triage vital signs and the nursing notes.  Pertinent labs & imaging results that were available during my care of the patient were reviewed by me and considered in my medical decision making (see chart for details).  52 year old female here with right great toe pain.  Thinks she was bit by something in the middle of night.  Took Benadryl and states has improved somewhat.  She is afebrile and nontoxic.  Small, 0.5 cm area of erythema to dorsal aspect of right great toe.  No swelling or signs of infection.  Foot is neurovascular intact.  Exam findings not consistent with acute trauma, gout, or other infectious pathology.  Given that this improved with Benadryl, recommend continue same at  home.  Can take Tylenol or Motrin for pain.  Follow-up PCP.  Return here for any new/acute  changes.  Final Clinical Impressions(s) / ED Diagnoses   Final diagnoses:  Pain of toe of right foot    ED Discharge Orders    None       Garlon Hatchet, PA-C 02/26/18 [id number]    Nicanor Alcon, April, MD 02/26/18 [phone]

## 2018-02-26 NOTE — ED Triage Notes (Signed)
Pt reports waking up to red painful area on right great toe. Pt denies any injury unsure if it may be a insect bite.

## 2018-02-26 NOTE — Discharge Instructions (Signed)
Continue benadryl Can take tylenol or advil for pain. Return here for any new/acute changes.

## 2018-03-01 ENCOUNTER — Other Ambulatory Visit (HOSPITAL_COMMUNITY): Payer: Self-pay | Admitting: Psychiatry

## 2018-03-01 DIAGNOSIS — F411 Generalized anxiety disorder: Secondary | ICD-10-CM

## 2018-03-01 DIAGNOSIS — F4001 Agoraphobia with panic disorder: Secondary | ICD-10-CM

## 2018-03-12 ENCOUNTER — Other Ambulatory Visit (HOSPITAL_COMMUNITY): Payer: Self-pay | Admitting: Psychiatry

## 2018-03-12 DIAGNOSIS — F333 Major depressive disorder, recurrent, severe with psychotic symptoms: Secondary | ICD-10-CM

## 2018-03-12 DIAGNOSIS — F4001 Agoraphobia with panic disorder: Secondary | ICD-10-CM

## 2018-03-12 DIAGNOSIS — F411 Generalized anxiety disorder: Secondary | ICD-10-CM

## 2018-03-21 ENCOUNTER — Emergency Department (HOSPITAL_COMMUNITY): Payer: Medicare HMO

## 2018-03-21 ENCOUNTER — Other Ambulatory Visit: Payer: Self-pay

## 2018-03-21 ENCOUNTER — Other Ambulatory Visit (HOSPITAL_COMMUNITY): Payer: Self-pay | Admitting: Psychiatry

## 2018-03-21 ENCOUNTER — Encounter (HOSPITAL_COMMUNITY): Payer: Self-pay | Admitting: Emergency Medicine

## 2018-03-21 ENCOUNTER — Observation Stay (HOSPITAL_COMMUNITY)
Admission: EM | Admit: 2018-03-21 | Discharge: 2018-03-23 | Disposition: A | Discharge status: Home / Self Care | Payer: Medicare HMO | Attending: Internal Medicine | Admitting: Internal Medicine

## 2018-03-21 DIAGNOSIS — Z79899 Other long term (current) drug therapy: Secondary | ICD-10-CM | POA: Insufficient documentation

## 2018-03-21 DIAGNOSIS — R82998 Other abnormal findings in urine: Secondary | ICD-10-CM | POA: Diagnosis not present

## 2018-03-21 DIAGNOSIS — Z791 Long term (current) use of non-steroidal anti-inflammatories (NSAID): Secondary | ICD-10-CM | POA: Diagnosis not present

## 2018-03-21 DIAGNOSIS — I10 Essential (primary) hypertension: Secondary | ICD-10-CM | POA: Insufficient documentation

## 2018-03-21 DIAGNOSIS — F4001 Agoraphobia with panic disorder: Secondary | ICD-10-CM

## 2018-03-21 DIAGNOSIS — F3181 Bipolar II disorder: Secondary | ICD-10-CM | POA: Diagnosis present

## 2018-03-21 DIAGNOSIS — Z7951 Long term (current) use of inhaled steroids: Secondary | ICD-10-CM | POA: Diagnosis not present

## 2018-03-21 DIAGNOSIS — K219 Gastro-esophageal reflux disease without esophagitis: Secondary | ICD-10-CM | POA: Diagnosis present

## 2018-03-21 DIAGNOSIS — M5430 Sciatica, unspecified side: Secondary | ICD-10-CM | POA: Diagnosis not present

## 2018-03-21 DIAGNOSIS — F411 Generalized anxiety disorder: Secondary | ICD-10-CM

## 2018-03-21 DIAGNOSIS — R531 Weakness: Secondary | ICD-10-CM | POA: Diagnosis present

## 2018-03-21 DIAGNOSIS — E0500 Thyrotoxicosis with diffuse goiter without thyrotoxic crisis or storm: Secondary | ICD-10-CM | POA: Diagnosis present

## 2018-03-21 DIAGNOSIS — R29898 Other symptoms and signs involving the musculoskeletal system: Secondary | ICD-10-CM | POA: Diagnosis not present

## 2018-03-21 DIAGNOSIS — M797 Fibromyalgia: Secondary | ICD-10-CM | POA: Diagnosis not present

## 2018-03-21 DIAGNOSIS — M79605 Pain in left leg: Secondary | ICD-10-CM | POA: Insufficient documentation

## 2018-03-21 DIAGNOSIS — G8929 Other chronic pain: Secondary | ICD-10-CM | POA: Diagnosis not present

## 2018-03-21 DIAGNOSIS — N390 Urinary tract infection, site not specified: Secondary | ICD-10-CM

## 2018-03-21 DIAGNOSIS — R339 Retention of urine, unspecified: Secondary | ICD-10-CM | POA: Insufficient documentation

## 2018-03-21 DIAGNOSIS — R338 Other retention of urine: Secondary | ICD-10-CM

## 2018-03-21 DIAGNOSIS — J4540 Moderate persistent asthma, uncomplicated: Secondary | ICD-10-CM | POA: Insufficient documentation

## 2018-03-21 DIAGNOSIS — F418 Other specified anxiety disorders: Secondary | ICD-10-CM | POA: Insufficient documentation

## 2018-03-21 DIAGNOSIS — Z9884 Bariatric surgery status: Secondary | ICD-10-CM | POA: Insufficient documentation

## 2018-03-21 DIAGNOSIS — Z79890 Hormone replacement therapy (postmenopausal): Secondary | ICD-10-CM | POA: Insufficient documentation

## 2018-03-21 DIAGNOSIS — M79604 Pain in right leg: Secondary | ICD-10-CM | POA: Diagnosis not present

## 2018-03-21 DIAGNOSIS — M545 Low back pain: Secondary | ICD-10-CM | POA: Diagnosis not present

## 2018-03-21 DIAGNOSIS — G43719 Chronic migraine without aura, intractable, without status migrainosus: Secondary | ICD-10-CM | POA: Diagnosis not present

## 2018-03-21 DIAGNOSIS — F333 Major depressive disorder, recurrent, severe with psychotic symptoms: Secondary | ICD-10-CM | POA: Diagnosis present

## 2018-03-21 DIAGNOSIS — F4010 Social phobia, unspecified: Secondary | ICD-10-CM | POA: Diagnosis present

## 2018-03-21 LAB — CBC WITH DIFFERENTIAL/PLATELET
Abs Immature Granulocytes: 0.04 10*3/uL (ref 0.00–0.07)
Basophils Absolute: 0 10*3/uL (ref 0.0–0.1)
Basophils Relative: 0 %
Eosinophils Absolute: 0 10*3/uL (ref 0.0–0.5)
Eosinophils Relative: 0 %
HCT: 40.5 % (ref 36.0–46.0)
Hemoglobin: 11.9 g/dL — ABNORMAL LOW (ref 12.0–15.0)
Immature Granulocytes: 0 %
Lymphocytes Relative: 10 %
Lymphs Abs: 1 10*3/uL (ref 0.7–4.0)
MCH: 27.4 pg (ref 26.0–34.0)
MCHC: 29.4 g/dL — ABNORMAL LOW (ref 30.0–36.0)
MCV: 93.3 fL (ref 80.0–100.0)
Monocytes Absolute: 0.9 10*3/uL (ref 0.1–1.0)
Monocytes Relative: 10 %
Neutro Abs: 7.3 10*3/uL (ref 1.7–7.7)
Neutrophils Relative %: 80 %
Platelets: 274 10*3/uL (ref 150–400)
RBC: 4.34 MIL/uL (ref 3.87–5.11)
RDW: 14.2 % (ref 11.5–15.5)
WBC: 9.2 10*3/uL (ref 4.0–10.5)
nRBC: 0 % (ref 0.0–0.2)

## 2018-03-21 LAB — URINALYSIS, ROUTINE W REFLEX MICROSCOPIC
Bilirubin Urine: NEGATIVE
Glucose, UA: NEGATIVE mg/dL
Hgb urine dipstick: NEGATIVE
Ketones, ur: NEGATIVE mg/dL
Leukocytes, UA: NEGATIVE
Nitrite: POSITIVE — AB
Protein, ur: NEGATIVE mg/dL
Specific Gravity, Urine: 1.01 (ref 1.005–1.030)
pH: 5 (ref 5.0–8.0)

## 2018-03-21 LAB — COMPREHENSIVE METABOLIC PANEL
ALT: 15 U/L (ref 0–44)
AST: 22 U/L (ref 15–41)
Albumin: 4.2 g/dL (ref 3.5–5.0)
Alkaline Phosphatase: 85 U/L (ref 38–126)
Anion gap: 11 (ref 5–15)
BUN: 15 mg/dL (ref 6–20)
CO2: 23 mmol/L (ref 22–32)
Calcium: 9.1 mg/dL (ref 8.9–10.3)
Chloride: 105 mmol/L (ref 98–111)
Creatinine, Ser: 1.17 mg/dL — ABNORMAL HIGH (ref 0.44–1.00)
GFR calc Af Amer: >60 mL/min (ref >60)
GFR calc non Af Amer: 52 mL/min — ABNORMAL LOW (ref >60)
Glucose, Bld: 152 mg/dL — ABNORMAL HIGH (ref 70–99)
Potassium: 4.9 mmol/L (ref 3.5–5.1)
Sodium: 139 mmol/L (ref 135–145)
Total Bilirubin: 0.2 mg/dL — ABNORMAL LOW (ref 0.3–1.2)
Total Protein: 8.1 g/dL (ref 6.5–8.1)

## 2018-03-21 LAB — SEDIMENTATION RATE: Sed Rate: 46 mm/hr — ABNORMAL HIGH (ref 0–22)

## 2018-03-21 LAB — VITAMIN B12: Vitamin B-12: 529 pg/mL (ref 180–914)

## 2018-03-21 LAB — C-REACTIVE PROTEIN: CRP: <0.8 mg/dL (ref <1.0)

## 2018-03-21 MED ORDER — MONTELUKAST SODIUM 10 MG PO TABS
10.0000 mg | ORAL_TABLET | Freq: Every day | ORAL | Status: DC
  Administered 2018-03-21 – 2018-03-22 (×2): 10 mg via ORAL
  Filled 2018-03-21 (×2): qty 1

## 2018-03-21 MED ORDER — SODIUM CHLORIDE 0.9 % IV SOLN
1.0000 g | INTRAVENOUS | Status: DC
  Administered 2018-03-22: 1 g via INTRAVENOUS
  Filled 2018-03-21: qty 10
  Filled 2018-03-21: qty 1

## 2018-03-21 MED ORDER — GADOBUTROL 1 MMOL/ML IV SOLN
10.0000 mL | Freq: Once | INTRAVENOUS | Status: AC | PRN
  Administered 2018-03-21: 10 mL via INTRAVENOUS

## 2018-03-21 MED ORDER — ALBUTEROL SULFATE (2.5 MG/3ML) 0.083% IN NEBU: 2.5000 mg | INHALATION_SOLUTION | Freq: Four times a day (QID) | RESPIRATORY_TRACT | Status: DC | PRN

## 2018-03-21 MED ORDER — SODIUM CHLORIDE 0.9 % IV SOLN
INTRAVENOUS | Status: AC
  Administered 2018-03-21 – 2018-03-22 (×2): via INTRAVENOUS

## 2018-03-21 MED ORDER — OXYCODONE HCL 5 MG PO TABS
10.0000 mg | ORAL_TABLET | Freq: Four times a day (QID) | ORAL | Status: DC | PRN
  Administered 2018-03-22: 10 mg via ORAL
  Filled 2018-03-21: qty 2

## 2018-03-21 MED ORDER — VITAMIN B-12 100 MCG PO TABS
100.0000 ug | ORAL_TABLET | Freq: Every day | ORAL | Status: DC
  Administered 2018-03-22 – 2018-03-23 (×2): 100 ug via ORAL
  Filled 2018-03-21 (×2): qty 1

## 2018-03-21 MED ORDER — FAMOTIDINE 20 MG PO TABS
10.0000 mg | ORAL_TABLET | Freq: Two times a day (BID) | ORAL | Status: DC
  Administered 2018-03-21 – 2018-03-23 (×4): 10 mg via ORAL
  Filled 2018-03-21 (×4): qty 1

## 2018-03-21 MED ORDER — AMITRIPTYLINE HCL 50 MG PO TABS
150.0000 mg | ORAL_TABLET | Freq: Every day | ORAL | Status: DC
  Administered 2018-03-21 – 2018-03-22 (×2): 150 mg via ORAL
  Filled 2018-03-21: qty 3
  Filled 2018-03-21: qty 6
  Filled 2018-03-21: qty 3

## 2018-03-21 MED ORDER — KETOTIFEN FUMARATE 0.025 % OP SOLN
1.0000 [drp] | Freq: Two times a day (BID) | OPHTHALMIC | Status: DC
  Administered 2018-03-21 – 2018-03-23 (×4): 1 [drp] via OPHTHALMIC
  Filled 2018-03-21: qty 5

## 2018-03-21 MED ORDER — ARIPIPRAZOLE 10 MG PO TABS
10.0000 mg | ORAL_TABLET | Freq: Every day | ORAL | Status: DC
  Administered 2018-03-21 – 2018-03-23 (×3): 10 mg via ORAL
  Filled 2018-03-21 (×3): qty 1

## 2018-03-21 MED ORDER — LORAZEPAM 2 MG/ML IJ SOLN
1.0000 mg | Freq: Once | INTRAMUSCULAR | Status: AC | PRN
  Administered 2018-03-21: 1 mg via INTRAVENOUS
  Filled 2018-03-21: qty 1

## 2018-03-21 MED ORDER — CYCLOBENZAPRINE HCL 10 MG PO TABS: 10.0000 mg | ORAL_TABLET | Freq: Three times a day (TID) | ORAL | Status: DC | PRN

## 2018-03-21 MED ORDER — POLYETHYLENE GLYCOL 3350 17 G PO PACK
17.0000 g | PACK | ORAL | Status: DC
  Administered 2018-03-22: 17 g via ORAL
  Filled 2018-03-21: qty 1

## 2018-03-21 MED ORDER — FLUTICASONE PROPIONATE 50 MCG/ACT NA SUSP
1.0000 | NASAL | Status: DC | PRN
  Filled 2018-03-21: qty 16

## 2018-03-21 MED ORDER — DULOXETINE HCL 60 MG PO CPEP
90.0000 mg | ORAL_CAPSULE | Freq: Every day | ORAL | Status: DC
  Administered 2018-03-21 – 2018-03-23 (×3): 90 mg via ORAL
  Filled 2018-03-21 (×3): qty 1

## 2018-03-21 MED ORDER — GABAPENTIN 300 MG PO CAPS
300.0000 mg | ORAL_CAPSULE | Freq: Two times a day (BID) | ORAL | Status: DC
  Administered 2018-03-21 – 2018-03-23 (×4): 300 mg via ORAL
  Filled 2018-03-21 (×4): qty 1

## 2018-03-21 MED ORDER — DICLOFENAC SODIUM 1 % TD GEL
2.0000 g | Freq: Three times a day (TID) | TRANSDERMAL | Status: DC
  Administered 2018-03-21 – 2018-03-22 (×4): 2 g via TOPICAL
  Filled 2018-03-21: qty 100

## 2018-03-21 MED ORDER — LACTATED RINGERS IV BOLUS
1000.0000 mL | Freq: Once | INTRAVENOUS | Status: AC
  Administered 2018-03-21: 1000 mL via INTRAVENOUS

## 2018-03-21 MED ORDER — ONDANSETRON HCL 4 MG PO TABS: 4.0000 mg | ORAL_TABLET | Freq: Four times a day (QID) | ORAL | Status: DC | PRN

## 2018-03-21 MED ORDER — MUSCLE RUB 10-15 % EX CREA
1.0000 "application " | TOPICAL_CREAM | Freq: Three times a day (TID) | CUTANEOUS | Status: DC | PRN
  Filled 2018-03-21: qty 85

## 2018-03-21 MED ORDER — ESTRADIOL 1 MG PO TABS
0.5000 mg | ORAL_TABLET | Freq: Every day | ORAL | Status: DC
  Administered 2018-03-21 – 2018-03-23 (×3): 0.5 mg via ORAL
  Filled 2018-03-21 (×3): qty 0.5

## 2018-03-21 MED ORDER — CLONAZEPAM 0.5 MG PO TABS: 0.5000 mg | ORAL_TABLET | Freq: Two times a day (BID) | ORAL | Status: DC | PRN

## 2018-03-21 MED ORDER — ADULT MULTIVITAMIN W/MINERALS CH
1.0000 | ORAL_TABLET | Freq: Every day | ORAL | Status: DC
  Administered 2018-03-22 – 2018-03-23 (×2): 1 via ORAL
  Filled 2018-03-21 (×2): qty 1

## 2018-03-21 MED ORDER — HYDROMORPHONE HCL 1 MG/ML IJ SOLN: 0.5000 mg | INTRAMUSCULAR | Status: DC | PRN

## 2018-03-21 MED ORDER — ENOXAPARIN SODIUM 40 MG/0.4ML ~~LOC~~ SOLN
40.0000 mg | SUBCUTANEOUS | Status: DC
  Administered 2018-03-21 – 2018-03-22 (×2): 40 mg via SUBCUTANEOUS
  Filled 2018-03-21 (×2): qty 0.4

## 2018-03-21 MED ORDER — ONDANSETRON HCL 4 MG/2ML IJ SOLN: 4.0000 mg | Freq: Four times a day (QID) | INTRAMUSCULAR | Status: DC | PRN

## 2018-03-21 MED ORDER — POLYETHYLENE GLYCOL 3350 17 G PO PACK: 17.0000 g | PACK | ORAL | Status: DC

## 2018-03-21 MED ORDER — MECLIZINE HCL 25 MG PO TABS: 25.0000 mg | ORAL_TABLET | Freq: Four times a day (QID) | ORAL | Status: DC | PRN

## 2018-03-21 MED ORDER — VITAMIN D3 25 MCG (1000 UNIT) PO TABS
1000.0000 [IU] | ORAL_TABLET | Freq: Every day | ORAL | Status: DC
  Administered 2018-03-22 – 2018-03-23 (×2): 1000 [IU] via ORAL
  Filled 2018-03-21 (×2): qty 1

## 2018-03-21 MED ORDER — SODIUM CHLORIDE 0.9 % IV SOLN
1.0000 g | Freq: Once | INTRAVENOUS | Status: AC
  Administered 2018-03-21: 1 g via INTRAVENOUS
  Filled 2018-03-21: qty 10

## 2018-03-21 MED ORDER — BIOTIN 5 MG PO CAPS: ORAL_CAPSULE | Freq: Every day | ORAL | Status: DC

## 2018-03-21 MED ORDER — LORATADINE 10 MG PO TABS
10.0000 mg | ORAL_TABLET | Freq: Every day | ORAL | Status: DC
  Administered 2018-03-21 – 2018-03-23 (×3): 10 mg via ORAL
  Filled 2018-03-21 (×3): qty 1

## 2018-03-21 MED ORDER — HYDROMORPHONE HCL 1 MG/ML IJ SOLN
1.0000 mg | Freq: Once | INTRAMUSCULAR | Status: AC
  Administered 2018-03-21: 1 mg via INTRAVENOUS
  Filled 2018-03-21: qty 1

## 2018-03-21 MED ORDER — METHYLPREDNISOLONE SODIUM SUCC 125 MG IJ SOLR
125.0000 mg | Freq: Once | INTRAMUSCULAR | Status: AC
  Administered 2018-03-21: 125 mg via INTRAVENOUS
  Filled 2018-03-21: qty 2

## 2018-03-21 MED ORDER — MOMETASONE FURO-FORMOTEROL FUM 100-5 MCG/ACT IN AERO
2.0000 | INHALATION_SPRAY | Freq: Two times a day (BID) | RESPIRATORY_TRACT | Status: DC
  Administered 2018-03-21 – 2018-03-22 (×3): 2 via RESPIRATORY_TRACT
  Filled 2018-03-21: qty 8.8

## 2018-03-21 MED ORDER — AZELASTINE HCL 0.1 % NA SOLN
1.0000 | Freq: Two times a day (BID) | NASAL | Status: DC
  Administered 2018-03-21 – 2018-03-23 (×4): 1 via NASAL
  Filled 2018-03-21: qty 30

## 2018-03-21 NOTE — ED Triage Notes (Signed)
Patient here from home with complaints of bilateral leg weakness and numbness. Reports that she woke up this morning and could not feel legs. Denies back pain.

## 2018-03-21 NOTE — ED Notes (Signed)
ED Provider at bedside. 

## 2018-03-21 NOTE — ED Notes (Addendum)
Patient made aware of need for urine sample. Patient unable to ambulate to restroom. Patient states they are unable to urinate. Purwik placed on patient by NT if patient becomes able to provide urine sample.

## 2018-03-21 NOTE — ED Notes (Signed)
EKG given to MD

## 2018-03-21 NOTE — Consult Note (Addendum)
Neurology Consultation  Reason for Consult: Ascending numbness and weakness Referring Physician: PUROHIT,   CC: A sending weakness and numbness over the past 3 days  History is obtained from: Patient  HPI: [name] is a 52 y.o. female with past medical history of vertigo, syncope, hypertension, hypercholesterolemia, Graves' disease, fibromyalgia, anxiety.  Patient states for the past 6 weeks she has been having diarrhea at least twice a week which is abnormal for her as she usually has constipation.  She states over the past 3 weeks she is noticed that she has had difficulty holding objects and dropping objects.  Over the past 3 days she is noticed decreased strength in her legs, pins-and-needles in her feet up to her knees to the point where she cannot walk at this point because it is so painful.  For this reason she states her children are having to dress her.  She denies any bowel or bladder incontinence.  Looking through the chart she stated that she has had some constipation which she denied with me.  She has had a fall recently but per son who is in the room she fell on her side and had some sciatica pain but nothing significant.  She does recollect that she is having some difficulty urinating.  Upon entering the room and talking to her initially she had clear focality with no slurring of speech until halfway through she started slurring her words slightly but this was intermittent.  The intermittency was also stopped when she was distracted.  Of note patient does note that she has been under significant stress over the past couple weeks secondary to the fact that her daughter is moved in with her kids.  This is confirmed with family member sitting next to her.    ED course --patient did have 1 L of urine output with in and out catheter.  Patient had MRI of her her whole neuro axis which was normal with the exception of mild cervical and lumbar degenerative disease with mild to moderate  neuroforaminal stenosis at C5-6 C6-7 and L5-S1.    ROS: A 14 point ROS was performed and is negative except as noted in the HPI.   Past Medical History:  Diagnosis Date  . Anxiety   . Asthma   . Depression   . Environmental allergies   . Fibromyalgia   . GERD (gastroesophageal reflux disease)   . Grave's disease   . Headache    migraines  . High cholesterol   . Hypertension   . Sciatica   . Syncope and collapse   . Vasovagal syncope   . Vertigo     Family History  Problem Relation Age of Onset  . Hypertension Mother   . Cirrhosis Mother   . Alcohol abuse Mother   . Depression Mother   . Physical abuse Mother   . Hypertension Sister   . Alcohol abuse Sister   . Drug abuse Sister   . Depression Sister   . Anxiety disorder Sister   . OCD Sister   . Hypertension Father   . Alcohol abuse Father   . ADD / ADHD Other   . Depression Sister   . Diabetes Other   . Hypertension Other   . Diabetes Maternal Uncle   . Seizures Cousin      Social History:   reports that she has never smoked. She has never used smokeless tobacco. She reports that she drinks about 1.0 standard drinks of alcohol per week. She  reports that she does not use drugs.  Medications No current facility-administered medications for this encounter.   Current Outpatient Medications:  .  albuterol (PROVENTIL HFA;VENTOLIN HFA) 108 (90 BASE) MCG/ACT inhaler, Inhale 2 puffs into the lungs every 6 (six) hours as needed for wheezing., Disp: , Rfl:  .  amitriptyline (ELAVIL) 50 MG tablet, Take 150 mg by mouth at bedtime. , Disp: , Rfl: 1 .  ARIPiprazole (ABILIFY) 10 MG tablet, Take 1 tablet (10 mg total) by mouth daily., Disp: 30 tablet, Rfl: 0 .  azelastine (ASTELIN) 137 MCG/SPRAY nasal spray, Place 1 spray into the nose 2 (two) times daily. Use in each nostril as directed, Disp: , Rfl:  .  BIOTIN 5000 PO, Take 5,000 Units by mouth daily. , Disp: , Rfl:  .  budesonide-formoterol (SYMBICORT) 80-4.5 MCG/ACT  inhaler, Inhale 2 puffs into the lungs 2 (two) times daily., Disp: , Rfl:  .  cetirizine (ZYRTEC) 10 MG tablet, Take 10 mg by mouth daily., Disp: , Rfl:  .  cholecalciferol (VITAMIN D) 1000 UNITS tablet, Take 1,000 Units by mouth daily. , Disp: , Rfl:  .  clonazePAM (KLONOPIN) 0.5 MG tablet, Take 1 tablet (0.5 mg total) by mouth 2 (two) times daily as needed for anxiety., Disp: 60 tablet, Rfl: 0 .  cyclobenzaprine (FLEXERIL) 10 MG tablet, Take 10 mg by mouth 2 (two) times daily as needed. msucle spasms, Disp: , Rfl: 2 .  diclofenac sodium (VOLTAREN) 1 % GEL, Apply 2 g topically 4 (four) times daily - after meals and at bedtime., Disp: , Rfl: 1 .  DULoxetine (CYMBALTA) 30 MG capsule, Take 3 capsules (90 mg total) by mouth daily., Disp: 90 capsule, Rfl: 0 .  Epinastine HCl (ELESTAT) 0.05 % ophthalmic solution, Place 1 drop into both eyes 2 (two) times daily as needed (itching). , Disp: , Rfl:  .  EPINEPHrine (EPIPEN) 0.3 mg/0.3 mL DEVI, Inject 0.3 mg into the muscle as needed. For allergic reaction to stinging insects, mushrooms, and shellfish , Disp: , Rfl:  .  estradiol (ESTRACE) 0.5 MG tablet, Take 0.5 mg by mouth daily., Disp: , Rfl:  .  fluconazole (DIFLUCAN) 200 MG tablet, Take 200 mg by mouth daily., Disp: , Rfl: 0 .  fluticasone (FLONASE) 50 MCG/ACT nasal spray, Place 1 spray into both nostrils as needed (allergies/runny nose). , Disp: , Rfl:  .  gabapentin (NEURONTIN) 300 MG capsule, Take 300 mg by mouth 2 (two) times daily., Disp: , Rfl:  .  ibuprofen (ADVIL,MOTRIN) 800 MG tablet, Take 800 mg by mouth every 8 (eight) hours as needed for moderate pain., Disp: , Rfl:  .  meclizine (ANTIVERT) 25 MG tablet, Take 1 tablet (25 mg total) by mouth every 6 (six) hours as needed for dizziness., Disp: 20 tablet, Rfl: 0 .  Menthol-Methyl Salicylate (BEN GAY GREASELESS) 10-15 % greaseless cream, Apply 1 application topically 3 (three) times daily as needed for pain., Disp: , Rfl:  .  mometasone-formoterol  (DULERA) 100-5 MCG/ACT AERO, Inhale 2 puffs into the lungs 2 (two) times daily as needed for wheezing or shortness of breath. , Disp: , Rfl:  .  montelukast (SINGULAIR) 10 MG tablet, Take 1 tablet (10 mg total) by mouth at bedtime., Disp: 30 tablet, Rfl: 0 .  Multiple Vitamins-Minerals (MULTIVITAMIN WITH MINERALS) tablet, Take 1 tablet by mouth daily., Disp: , Rfl:  .  Oxycodone HCl 10 MG TABS, Take 10 mg by mouth 2 (two) times daily as needed., Disp: , Rfl:  .  oxymetazoline (AFRIN NASAL SPRAY) 0.05 % nasal spray, Place 1 spray into both nostrils 2 (two) times daily. (Patient taking differently: Place 1 spray into both nostrils 2 (two) times daily as needed for congestion. ), Disp: 30 mL, Rfl: 0 .  polyethylene glycol (MIRALAX / GLYCOLAX) packet, Take 17 g by mouth every other day. , Disp: , Rfl:  .  ranitidine (ZANTAC) 150 MG tablet, Take 150 mg by mouth daily., Disp: , Rfl:  .  vitamin B-12 (CYANOCOBALAMIN) 100 MCG tablet, Take 100 mcg by mouth daily., Disp: , Rfl:  .  Erenumab-aooe (AIMOVIG) 140 MG/ML SOAJ, Inject 140 mg into the skin every 30 (thirty) days. (Patient not taking: Reported on 03/21/2018), Disp: 1 pen, Rfl: 11 .  oxyCODONE-acetaminophen (PERCOCET/ROXICET) 5-325 MG per tablet, Take one every 6 hours if needed for cough (Patient not taking: Reported on 03/21/2018), Disp: 12 tablet, Rfl: 0 .  SUMAtriptan (IMITREX) 100 MG tablet, Take 1 tablet (100 mg total) by mouth once as needed for up to 1 dose for migraine. May repeat in 2 hours if headache persists or recurs. (Patient not taking: Reported on 03/21/2018), Disp: 9 tablet, Rfl: 12 .  zaleplon (SONATA) 10 MG capsule, Take one at bedtime. May repeat if necessary (Patient not taking: Reported on 03/21/2018), Disp: 60 capsule, Rfl: 0   Exam: Current vital signs: BP 120/78   Pulse 94   Temp 98.1 F (36.7 C) (Oral)   Resp 19   SpO2 97%  Vital signs in last 24 hours: Temp:  [98.1 F (36.7 C)] 98.1 F (36.7 C) (10/29 0740) Pulse  Rate:  [83-99] 94 (10/29 1500) Resp:  [16-19] 19 (10/29 1348) BP: (109-146)/(70-114) 120/78 (10/29 1500) SpO2:  [96 %-100 %] 97 % (10/29 1500)  Physical Exam  Constitutional: Appears well-developed and well-nourished.  Psych: Affect appropriate to situation Eyes: No scleral injection HENT: No OP obstrucion Head: Normocephalic.  Cardiovascular: Normal rate and regular rhythm.  Respiratory: Effort normal, non-labored breathing GI: Soft.  No distension. There is no tenderness.  Skin: WDI  Neuro: Mental Status: Patient is awake, alert, oriented to person, place, month, year, and situation. Patient is able to give a clear and coherent history. No signs of aphasia or neglect Cranial Nerves: II: Visual Fields are full. Pupils are equal, round, and reactive to light.   III,IV, VI: EOMI without ptosis or diploplia.  V: Facial sensation is symmetric to temperature VII: Facial movement is symmetric.  VIII: hearing is intact to voice X: Uvula elevates symmetrically XI: Shoulder shrug is symmetric. XII: tongue is midline without atrophy or fasciculations.  Motor: Tone is normal. Bulk is normal. 5/5 strength was present in all four extremities.  Sensory: Patient had dysesthesia of her feet up to mid calf otherwise normal sensation.  Patient had appropriate proprioception and vibratory sensation along with temperature sensation Deep Tendon Reflexes: Patient did not have an Achilles reflex but she did have 2+ bilateral patellar reflexes and 2+ reflexes in the upper extremities Plantars: Toes are downgoing bilaterally.  Cerebellar: FNF and HKS are intact bilaterally Labs I have reviewed labs in epic and the results pertinent to this consultation are:   CBC    Component Value Date/Time   WBC 9.2 03/21/2018 0850   RBC 4.34 03/21/2018 0850   HGB 11.9 (L) 03/21/2018 0850   HCT 40.5 03/21/2018 0850   PLT 274 03/21/2018 0850   MCV 93.3 03/21/2018 0850   MCV 89.1 06/12/2014 2032   MCH  27.4 03/21/2018 0850  MCHC 29.4 (L) 03/21/2018 0850   RDW 14.2 03/21/2018 0850   LYMPHSABS 1.0 03/21/2018 0850   MONOABS 0.9 03/21/2018 0850   EOSABS 0.0 03/21/2018 0850   BASOSABS 0.0 03/21/2018 0850    CMP     Component Value Date/Time   NA 139 03/21/2018 0850   K 4.9 03/21/2018 0850   CL 105 03/21/2018 0850   CO2 23 03/21/2018 0850   GLUCOSE 152 (H) 03/21/2018 0850   BUN 15 03/21/2018 0850   CREATININE 1.17 (H) 03/21/2018 0850   CREATININE 0.85 06/12/2014 2031   CALCIUM 9.1 03/21/2018 0850   PROT 8.1 03/21/2018 0850   ALBUMIN 4.2 03/21/2018 0850   AST 22 03/21/2018 0850   ALT 15 03/21/2018 0850   ALKPHOS 85 03/21/2018 0850   BILITOT 0.2 (L) 03/21/2018 0850   GFRNONAA 52 (L) 03/21/2018 0850   GFRAA >60 03/21/2018 0850    Lipid Panel  No results found for: CHOL, TRIG, HDL, CHOLHDL, VLDL, LDLCALC, LDLDIRECT   Imaging I have reviewed the images obtained:  CT-scan of the brain  MRI examination of the brain  Etta Quill PA-C [facility] [phone]  M-F  (9:00 am- 5:00 PM)  03/21/2018, 3:58 PM   I have seen the patient and reviewed the above note.  Interestingly, this is the third time that the patient has had a similar episode, she states that each time this happens last for 2 to 3 days.  She does state that this is the worst that she has had, however.  She has had urinary retention for the past month, not clearly related to the timing of the lower extremity weakness.  Assessment: 52 year old female with bilateral lower extremity weakness and allodynia of unclear etiology.  Though she does have depressed Achilles reflex, with her preserved patellar and upper extremity reflexes I think that Guillain-Barr syndrome is much less likely.  Also the allodynia out of proportion to weakness or numbness would argue against this.    Recommendations: 1) B1, B12, ESR,CRP 2) PT,OT 3) will follow  Roland Rack, MD [facility] [phone]  If 7pm- 7am, please page neurology on call as listed in [place].

## 2018-03-21 NOTE — ED Notes (Signed)
ED TO INPATIENT HANDOFF REPORT  Name/Age/Gender [name] 52 y.o. female  Code Status Code Status History    Date Active Date Inactive Code Status Order ID Comments User Context   11/01/2012 2227 11/04/2012 1648 Full Code [id number]  Charlynne Cousins, MD Inpatient    Advance Directive Documentation     Most Recent Value  Type of Advance Directive  Living will  Pre-existing out of facility DNR order (yellow form or pink MOST form)  -  "MOST" Form in Place?  -      Home/SNF/Other Home  Chief Complaint cant move legs  Level of Care/Admitting Diagnosis ED Disposition    ED Disposition Condition [place]: [facility] [[id number]]  Level of Care: Med-Surg [16]  Diagnosis: Leg weakness [[id number]]  Admitting Physician: Cristy Folks [[id number]]  Attending Physician: Cristy Folks [[id number]]  Estimated length of stay: past midnight tomorrow  Certification:: I certify this patient will need inpatient services for at least 2 midnights  PT Class (Do Not Modify): Inpatient [101]  PT Acc Code (Do Not Modify): Private [1]       Medical History Past Medical History:  Diagnosis Date  . Anxiety   . Asthma   . Depression   . Environmental allergies   . Fibromyalgia   . GERD (gastroesophageal reflux disease)   . Grave's disease   . Headache    migraines  . High cholesterol   . Hypertension   . Sciatica   . Syncope and collapse   . Vasovagal syncope   . Vertigo     Allergies Allergies  Allergen Reactions  . Bee Venom Anaphylaxis    Other reaction(s): PRURITUS, RASH  . Coconut Fatty Acids Anaphylaxis    Patient allergic to coconut in general  . Mushroom Ext Cmplx(Shiitake-Reishi-Mait) Anaphylaxis  . Nutritional Supplements Anaphylaxis and Itching    walnuts  . Other Anaphylaxis, Hives and Itching    Ragweed  . Shellfish Allergy Anaphylaxis  . Strawberry Extract Anaphylaxis  . Hydrocodone-Acetaminophen Itching    IV  Location/Drains/Wounds Patient Lines/Drains/Airways Status   Active Line/Drains/Airways    Name:   Placement date:   Placement time:   Site:   Days:   Peripheral IV 03/21/18 Right Antecubital   03/21/18    0856    Antecubital   less than 1          Labs/Imaging Results for orders placed or performed during the hospital encounter of 03/21/18 (from the past 48 hour(s))  CBC with Differential     Status: Abnormal   Collection Time: 03/21/18  8:50 AM  Result Value Ref Range   WBC 9.2 4.0 - 10.5 K/uL   RBC 4.34 3.87 - 5.11 MIL/uL   Hemoglobin 11.9 (L) 12.0 - 15.0 g/dL   HCT 40.5 36.0 - 46.0 %   MCV 93.3 80.0 - 100.0 fL   MCH 27.4 26.0 - 34.0 pg   MCHC 29.4 (L) 30.0 - 36.0 g/dL   RDW 14.2 11.5 - 15.5 %   Platelets 274 150 - 400 K/uL   nRBC 0.0 0.0 - 0.2 %   Neutrophils Relative % 80 %   Neutro Abs 7.3 1.7 - 7.7 K/uL   Lymphocytes Relative 10 %   Lymphs Abs 1.0 0.7 - 4.0 K/uL   Monocytes Relative 10 %   Monocytes Absolute 0.9 0.1 - 1.0 K/uL   Eosinophils Relative 0 %   Eosinophils Absolute 0.0 0.0 - 0.5 K/uL  Basophils Relative 0 %   Basophils Absolute 0.0 0.0 - 0.1 K/uL   Immature Granulocytes 0 %   Abs Immature Granulocytes 0.04 0.00 - 0.07 K/uL    Comment: Performed at [facility], [place] [address] [place], [place] [address]  Comprehensive metabolic panel     Status: Abnormal   Collection Time: 03/21/18  8:50 AM  Result Value Ref Range   Sodium 139 135 - 145 mmol/L   Potassium 4.9 3.5 - 5.1 mmol/L   Chloride 105 98 - 111 mmol/L   CO2 23 22 - 32 mmol/L   Glucose, Bld 152 (H) 70 - 99 mg/dL   BUN 15 6 - 20 mg/dL   Creatinine, Ser 1.17 (H) 0.44 - 1.00 mg/dL   Calcium 9.1 8.9 - 10.3 mg/dL   Total Protein 8.1 6.5 - 8.1 g/dL   Albumin 4.2 3.5 - 5.0 g/dL   AST 22 15 - 41 U/L   ALT 15 0 - 44 U/L   Alkaline Phosphatase 85 38 - 126 U/L   Total Bilirubin 0.2 (L) 0.3 - 1.2 mg/dL   GFR calc non Af Amer 52 (L) >60 mL/min   GFR calc Af Amer >60 >60 mL/min     Comment: (NOTE) The eGFR has been calculated using the CKD EPI equation. This calculation has not been validated in all clinical situations. eGFR's persistently <60 mL/min signify possible Chronic Kidney Disease.    Anion gap 11 5 - 15    Comment: Performed at [facility], [place] [address], [place], [place] [address]  Urinalysis, Routine w reflex microscopic     Status: Abnormal   Collection Time: 03/21/18  2:04 PM  Result Value Ref Range   Color, Urine YELLOW YELLOW   APPearance CLEAR CLEAR   Specific Gravity, Urine 1.010 1.005 - 1.030   pH 5.0 5.0 - 8.0   Glucose, UA NEGATIVE NEGATIVE mg/dL   Hgb urine dipstick NEGATIVE NEGATIVE   Bilirubin Urine NEGATIVE NEGATIVE   Ketones, ur NEGATIVE NEGATIVE mg/dL   Protein, ur NEGATIVE NEGATIVE mg/dL   Nitrite POSITIVE (A) NEGATIVE   Leukocytes, UA NEGATIVE NEGATIVE   RBC / HPF 0-5 0 - 5 RBC/hpf   WBC, UA 0-5 0 - 5 WBC/hpf   Bacteria, UA MANY (A) NONE SEEN    Comment: Performed at [facility], [place] [address] [place], [place] [address]   Mr Brain W And Wo Contrast  Result Date: 03/21/2018 CLINICAL DATA:  Lower extremity weakness. EXAM: MRI HEAD WITHOUT AND WITH CONTRAST TECHNIQUE: Multiplanar, multiecho pulse sequences of the brain and surrounding structures were obtained without and with intravenous contrast. CONTRAST:  10 mL Gadavist COMPARISON:  Brain MRI 11/09/2017 FINDINGS: BRAIN: There is no acute infarct, acute hemorrhage, hydrocephalus or extra-axial collection. The midline structures are normal. No midline shift or other mass effect. There are no old infarcts. There are a few clustered foci of hyperintense T2-weighted signal in the left frontal subcortical white matter and right periatrial white matter, but this is a nonspecific finding and commonly seen in normal patients in this age range. On the prior scan, the apparent greater number of lesions was probably due partially to pulsation artifacts.  The cerebral and cerebellar volume are age-appropriate. Susceptibility-sensitive sequences show no chronic microhemorrhage or superficial siderosis. VASCULAR: Major intracranial arterial and venous sinus flow voids are normal. SKULL AND UPPER CERVICAL SPINE: Calvarial bone marrow signal is normal. There is no skull base mass. Visualized upper cervical spine and  soft tissues are normal. SINUSES/ORBITS: No fluid levels or advanced mucosal thickening. No mastoid or middle ear effusion. The orbits are normal. IMPRESSION: Normal aging brain. Electronically Signed   By: Ulyses Jarred M.D.   On: 03/21/2018 13:47   Mr Cervical Spine W Or Wo Contrast  Result Date: 03/21/2018 EXAM: MRI TOTAL SPINE WITHOUT AND WITH CONTRAST TECHNIQUE: Multisequence MR imaging of the spine from the cervical spine to the sacrum was performed prior to and following IV contrast administration. CONTRAST:  10 mL Gadavist COMPARISON:  None. FINDINGS: MRI CERVICAL SPINE FINDINGS Alignment: Physiologic. Vertebrae: No fracture, evidence of discitis, or bone lesion. Cord: Normal signal and morphology. Posterior Fossa, vertebral arteries, paraspinal tissues: Visualized posterior fossa is normal. Vertebral artery flow voids are preserved. No prevertebral soft tissue swelling. Disc levels: The C2-C5 levels are normal. C5-6: Bilateral uncovertebral hypertrophy with moderate neural foraminal stenosis. C6-7: Mild uncovertebral hypertrophy with mild bilateral foraminal stenosis. C7-T1 is normal. MRI THORACIC SPINE FINDINGS Alignment:  Physiologic. Vertebrae: No fracture, evidence of discitis, or bone lesion. Cord:  Normal signal and morphology. Paraspinal and other soft tissues: Negative. Disc levels: No disc herniation or stenosis. MRI LUMBAR SPINE FINDINGS Segmentation:  Standard. Alignment:  Physiologic. Vertebrae:  No fracture, evidence of discitis, or bone lesion. Conus medullaris: Extends to the L1 level and appears normal. Paraspinal and other soft  tissues: Negative. Disc levels: L4-5: Moderate facet hypertrophy. No spinal canal or neural foraminal stenosis. L5-S1: Left eccentric disc bulge with mild left neural foraminal narrowing. There is no abnormal contrast enhancement. IMPRESSION: 1. Normal appearance of the spinal cord. No evidence of demyelinating disease within the spine. 2. Mild cervical and lumbar degenerative disc disease with mild-to-moderate neural foraminal stenosis at C5-6, C6-7 and L5-S1. Electronically Signed   By: Ulyses Jarred M.D.   On: 03/21/2018 13:27   Mr Thoracic Spine W Wo Contrast  Result Date: 03/21/2018 EXAM: MRI TOTAL SPINE WITHOUT AND WITH CONTRAST TECHNIQUE: Multisequence MR imaging of the spine from the cervical spine to the sacrum was performed prior to and following IV contrast administration. CONTRAST:  10 mL Gadavist COMPARISON:  None. FINDINGS: MRI CERVICAL SPINE FINDINGS Alignment: Physiologic. Vertebrae: No fracture, evidence of discitis, or bone lesion. Cord: Normal signal and morphology. Posterior Fossa, vertebral arteries, paraspinal tissues: Visualized posterior fossa is normal. Vertebral artery flow voids are preserved. No prevertebral soft tissue swelling. Disc levels: The C2-C5 levels are normal. C5-6: Bilateral uncovertebral hypertrophy with moderate neural foraminal stenosis. C6-7: Mild uncovertebral hypertrophy with mild bilateral foraminal stenosis. C7-T1 is normal. MRI THORACIC SPINE FINDINGS Alignment:  Physiologic. Vertebrae: No fracture, evidence of discitis, or bone lesion. Cord:  Normal signal and morphology. Paraspinal and other soft tissues: Negative. Disc levels: No disc herniation or stenosis. MRI LUMBAR SPINE FINDINGS Segmentation:  Standard. Alignment:  Physiologic. Vertebrae:  No fracture, evidence of discitis, or bone lesion. Conus medullaris: Extends to the L1 level and appears normal. Paraspinal and other soft tissues: Negative. Disc levels: L4-5: Moderate facet hypertrophy. No spinal  canal or neural foraminal stenosis. L5-S1: Left eccentric disc bulge with mild left neural foraminal narrowing. There is no abnormal contrast enhancement. IMPRESSION: 1. Normal appearance of the spinal cord. No evidence of demyelinating disease within the spine. 2. Mild cervical and lumbar degenerative disc disease with mild-to-moderate neural foraminal stenosis at C5-6, C6-7 and L5-S1. Electronically Signed   By: Ulyses Jarred M.D.   On: 03/21/2018 13:27   Mr Lumbar Spine W Wo Contrast  Result Date: 03/21/2018 EXAM: MRI TOTAL  SPINE WITHOUT AND WITH CONTRAST TECHNIQUE: Multisequence MR imaging of the spine from the cervical spine to the sacrum was performed prior to and following IV contrast administration. CONTRAST:  10 mL Gadavist COMPARISON:  None. FINDINGS: MRI CERVICAL SPINE FINDINGS Alignment: Physiologic. Vertebrae: No fracture, evidence of discitis, or bone lesion. Cord: Normal signal and morphology. Posterior Fossa, vertebral arteries, paraspinal tissues: Visualized posterior fossa is normal. Vertebral artery flow voids are preserved. No prevertebral soft tissue swelling. Disc levels: The C2-C5 levels are normal. C5-6: Bilateral uncovertebral hypertrophy with moderate neural foraminal stenosis. C6-7: Mild uncovertebral hypertrophy with mild bilateral foraminal stenosis. C7-T1 is normal. MRI THORACIC SPINE FINDINGS Alignment:  Physiologic. Vertebrae: No fracture, evidence of discitis, or bone lesion. Cord:  Normal signal and morphology. Paraspinal and other soft tissues: Negative. Disc levels: No disc herniation or stenosis. MRI LUMBAR SPINE FINDINGS Segmentation:  Standard. Alignment:  Physiologic. Vertebrae:  No fracture, evidence of discitis, or bone lesion. Conus medullaris: Extends to the L1 level and appears normal. Paraspinal and other soft tissues: Negative. Disc levels: L4-5: Moderate facet hypertrophy. No spinal canal or neural foraminal stenosis. L5-S1: Left eccentric disc bulge with mild  left neural foraminal narrowing. There is no abnormal contrast enhancement. IMPRESSION: 1. Normal appearance of the spinal cord. No evidence of demyelinating disease within the spine. 2. Mild cervical and lumbar degenerative disc disease with mild-to-moderate neural foraminal stenosis at C5-6, C6-7 and L5-S1. Electronically Signed   By: Ulyses Jarred M.D.   On: 03/21/2018 13:27    Pending Labs Unresulted Labs (From admission, onward)    Start     Ordered   03/21/18 1954  Vitamin B12  Once,   R    Question:  Specimen collection method  Answer:  IV Team   03/21/18 1953   03/21/18 1954  Vitamin B1  Once,   R    Question:  Specimen collection method  Answer:  IV Team   03/21/18 1953   03/21/18 1952  Sedimentation rate  Once,   R    Question:  Specimen collection method  Answer:  IV Team   03/21/18 1953   03/21/18 1952  C-reactive protein  Once,   R    Question:  Specimen collection method  Answer:  IV Team   03/21/18 1953   03/21/18 1506  Urine culture  Add-on,   STAT     03/21/18 1505   Signed and Held  HIV antibody (Routine Testing)  Once,   R     Signed and Held   Signed and Held  Basic metabolic panel  Tomorrow morning,   R     Signed and Held   Signed and Held  CBC  Tomorrow morning,   R     Signed and Held          Vitals/Pain Today's Vitals   03/21/18 1638 03/21/18 1645 03/21/18 1838 03/21/18 1838  BP: 130/72 130/72  126/74  Pulse: 98 99  97  Resp: 19 18  18   Temp:    98.4 F (36.9 C)  TempSrc:    Oral  SpO2: 95% 95%  93%  PainSc:   7      Isolation Precautions No active isolations  Medications Medications  HYDROmorphone (DILAUDID) injection 1 mg (1 mg Intravenous Given 03/21/18 0859)  lactated ringers bolus 1,000 mL (0 mLs Intravenous Stopped 03/21/18 1009)  LORazepam (ATIVAN) injection 1 mg (1 mg Intravenous Given 03/21/18 1032)  gadobutrol (GADAVIST) 1 MMOL/ML injection 10 mL (10 mLs Intravenous  Contrast Given 03/21/18 1239)  methylPREDNISolone sodium  succinate (SOLU-MEDROL) 125 mg/2 mL injection 125 mg (125 mg Intravenous Given 03/21/18 1418)  cefTRIAXone (ROCEPHIN) 1 g in sodium chloride 0.9 % 100 mL IVPB (0 g Intravenous Stopped 03/21/18 1829)  HYDROmorphone (DILAUDID) injection 1 mg (1 mg Intravenous Given 03/21/18 1530)    Mobility walks with device

## 2018-03-21 NOTE — ED Provider Notes (Signed)
Emergency Department Provider Note   I have reviewed the triage vital signs and the nursing notes.   HISTORY  Chief Complaint Back Pain and Extremity Weakness   HPI [name] is a 52 y.o. female with unclear medical history but may have a history of multiple sclerosis and fibromyalgia and sciatica the presents the emergency department lower extremity weakness.  Patient states that she has had episodes in the past with her MS of dropping things and that started last night where she was dropping things with both hands but this morning she went to get out of bed and she could not walk to the bathroom secondary to weakness and pain in both of her legs.  She states that she was not able to move she was able to bear weight for a couple seconds and then went down to the ground and her sister helped her to get back up when she went to go to the bathroom and she was having trouble urinating.  She is a bit of back pain consistent with previous sciatica but nothing new.  She has no perineal anesthesia.  She feels like the weakness is in both legs and the pain is in both legs equally.  No trauma recently.  No history of cancer.  No recent infections. No other associated or modifying symptoms.    Past Medical History:  Diagnosis Date  . Anxiety   . Asthma   . Depression   . Environmental allergies   . Fibromyalgia   . GERD (gastroesophageal reflux disease)   . Grave's disease   . Headache    migraines  . High cholesterol   . Hypertension   . Sciatica   . Syncope and collapse   . Vasovagal syncope   . Vertigo     Patient Active Problem List   Diagnosis Date Noted  . Intractable chronic migraine without aura and without status migrainosus 04/27/2016  . Intractable chronic migraine without aura and with status migrainosus 01/14/2016  . Insomnia 05/27/2015  . Bipolar 2 disorder (HCC) 02/25/2015  . 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  Severe 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 (HCC) 10/10/2014  . GAD (generalized anxiety disorder) 10/10/2014  . Panic disorder with agoraphobia 10/10/2014  . Social anxiety disorder 10/10/2014  . Other complicated headache syndrome 09/24/2014  . Jaw pain-acute TMJ 09/24/14 09/24/2014  . Protein-calorie malnutrition, severe (HCC) 11/03/2012  . Severe malnutrition (HCC) 11/03/2012  . Lethargy 11/01/2012  . Drug overdose, multiple drugs 11/01/2012  . Hypokalemia 11/01/2012  . Depression 11/22/2011  . Chronic pain 11/22/2011  . Grave's disease   . Asthma   . GERD (gastroesophageal reflux disease)     Past Surgical History:  Procedure Laterality Date  . ABDOMINAL HYSTERECTOMY  2006   partial  . GASTRIC BYPASS  2008    Current Outpatient Rx  . Order #: [id number] Class: Historical Med  . Order #: [id number] Class: Historical Med  . Order #: [id number] Class: Normal  . Order #: [id number] Class: Historical Med  . Order #: [id number] Class: Historical Med  . Order #: [id number] Class: Historical Med  . Order #: [id number] Class: Historical Med  . Order #: [id number] Class: Historical Med  . Order #: [id number] Class: Normal  . Order #: [id number] Class: Historical Med  . Order #: [id number] Class: Historical Med  . Order #: [id number] Class: Normal  . Order #: [id number] Class: Historical Med  . Order #: [id number] Class: Historical Med  . Order #: [id number] Class: Historical Med  .  Order #: [id number] Class: Historical Med  . Order #: [id number] Class: Historical Med  . Order #: [id number] Class: Historical Med  . Order #: [id number] Class: Historical Med  . Order #: [id number] Class: Print  . Order #: [id number] Class: Historical Med  . Order #: [id number] Class: Historical Med  . Order #: [id number] Class: Print  . Order #: [id number] Class: Historical Med  . Order #: [id number] Class: Historical Med  . Order #: [id number] Class: Print  . Order #: [id number] Class: Historical Med  . Order #: [id number] Class:  Historical Med  . Order #: [id number] Class: Historical Med  . Order #: [id number] Class: Normal  . Order #: [id number] Class: Print  . Order #: [id number] Class: Normal  . Order #: [id number] Class: Normal    Allergies Bee venom; Coconut fatty acids; Mushroom ext cmplx(shiitake-reishi-mait); Nutritional supplements; Other; Shellfish allergy; Strawberry extract; and Hydrocodone-acetaminophen  Family History  Problem Relation Age of Onset  . Hypertension Mother   . Cirrhosis Mother   . Alcohol abuse Mother   . Depression Mother   . Physical abuse Mother   . Hypertension Sister   . Alcohol abuse Sister   . Drug abuse Sister   . Depression Sister   . Anxiety disorder Sister   . OCD Sister   . Hypertension Father   . Alcohol abuse Father   . ADD / ADHD Other   . Depression Sister   . Diabetes Other   . Hypertension Other   . Diabetes Maternal Uncle   . Seizures Cousin     Social History Social History   Tobacco Use  . Smoking status: Never Smoker  . Smokeless tobacco: Never Used  Substance Use Topics  . Alcohol use: Yes    Alcohol/week: 1.0 standard drinks    Types: 1 Glasses of wine per week    Comment: one glass of wine every 3 months  . Drug use: No    Review of Systems  All other systems negative except as documented in the HPI. All pertinent positives and negatives as reviewed in the HPI. ____________________________________________   PHYSICAL EXAM:  VITAL SIGNS: ED Triage Vitals  Enc Vitals Group     BP 03/21/18 0740 139/89     Pulse Rate 03/21/18 0740 99     Resp 03/21/18 0740 16     Temp 03/21/18 0740 98.1 F (36.7 C)     Temp Source 03/21/18 0740 Oral     SpO2 03/21/18 0740 97 %    Constitutional: Alert and oriented. Well appearing and in no acute distress. Eyes: Conjunctivae are normal. PERRL. EOMI. Head: Atraumatic. Nose: No congestion/rhinnorhea. Mouth/Throat: Mucous membranes are moist.  Oropharynx non-erythematous. Neck: No stridor.  No  meningeal signs.   Cardiovascular: Normal rate, regular rhythm. Good peripheral circulation. Grossly normal heart sounds.   Respiratory: Normal respiratory effort.  No retractions. Lungs CTAB. Gastrointestinal: Soft and nontender. No distention.  Musculoskeletal: No lower extremity tenderness nor edema. No gross deformities of extremities. Neurologic:  Normal speech and language. Symmetric weakness in legs 4/5. When assisted, able to hold heels off bed for 4-5 seconds. Diminished reflexes in right arm and right leg compared to left. Painful sensation to bilateral legs.  Skin:  Skin is warm, dry and intact. No rash noted.   ____________________________________________   LABS (all labs ordered are listed, but only abnormal results are displayed)  Labs Reviewed  CBC WITH DIFFERENTIAL/PLATELET - Abnormal; Notable for the following components:      Result Value   Hemoglobin 11.9 (*)    MCHC 29.4 (*)  All other components within normal limits  COMPREHENSIVE METABOLIC PANEL - Abnormal; Notable for the following components:   Glucose, Bld 152 (*)    Creatinine, Ser 1.17 (*)    Total Bilirubin 0.2 (*)    GFR calc non Af Amer 52 (*)    All other components within normal limits  URINALYSIS, ROUTINE W REFLEX MICROSCOPIC - Abnormal; Notable for the following components:   Nitrite POSITIVE (*)    Bacteria, UA MANY (*)    All other components within normal limits  URINE CULTURE   ____________________________________________  EKG   EKG Interpretation  Date/Time:    Ventricular Rate:    PR Interval:    QRS Duration:   QT Interval:    QTC Calculation:   R Axis:     Text Interpretation:         ____________________________________________  RADIOLOGY  Mr Laqueta Jean And Wo Contrast  Result Date: 03/21/2018 CLINICAL DATA:  Lower extremity weakness. EXAM: MRI HEAD WITHOUT AND WITH CONTRAST TECHNIQUE: Multiplanar, multiecho pulse sequences of the brain and surrounding structures were  obtained without and with intravenous contrast. CONTRAST:  10 mL Gadavist COMPARISON:  Brain MRI 11/09/2017 FINDINGS: BRAIN: There is no acute infarct, acute hemorrhage, hydrocephalus or extra-axial collection. The midline structures are normal. No midline shift or other mass effect. There are no old infarcts. There are a few clustered foci of hyperintense T2-weighted signal in the left frontal subcortical white matter and right periatrial white matter, but this is a nonspecific finding and commonly seen in normal patients in this age range. On the prior scan, the apparent greater number of lesions was probably due partially to pulsation artifacts. The cerebral and cerebellar volume are age-appropriate. Susceptibility-sensitive sequences show no chronic microhemorrhage or superficial siderosis. VASCULAR: Major intracranial arterial and venous sinus flow voids are normal. SKULL AND UPPER CERVICAL SPINE: Calvarial bone marrow signal is normal. There is no skull base mass. Visualized upper cervical spine and soft tissues are normal. SINUSES/ORBITS: No fluid levels or advanced mucosal thickening. No mastoid or middle ear effusion. The orbits are normal. IMPRESSION: Normal aging brain. Electronically Signed   By: Deatra Robinson M.D.   On: 03/21/2018 13:47   Mr Cervical Spine W Or Wo Contrast  Result Date: 03/21/2018 EXAM: MRI TOTAL SPINE WITHOUT AND WITH CONTRAST TECHNIQUE: Multisequence MR imaging of the spine from the cervical spine to the sacrum was performed prior to and following IV contrast administration. CONTRAST:  10 mL Gadavist COMPARISON:  None. FINDINGS: MRI CERVICAL SPINE FINDINGS Alignment: Physiologic. Vertebrae: No fracture, evidence of discitis, or bone lesion. Cord: Normal signal and morphology. Posterior Fossa, vertebral arteries, paraspinal tissues: Visualized posterior fossa is normal. Vertebral artery flow voids are preserved. No prevertebral soft tissue swelling. Disc levels: The C2-C5 levels  are normal. C5-6: Bilateral uncovertebral hypertrophy with moderate neural foraminal stenosis. C6-7: Mild uncovertebral hypertrophy with mild bilateral foraminal stenosis. C7-T1 is normal. MRI THORACIC SPINE FINDINGS Alignment:  Physiologic. Vertebrae: No fracture, evidence of discitis, or bone lesion. Cord:  Normal signal and morphology. Paraspinal and other soft tissues: Negative. Disc levels: No disc herniation or stenosis. MRI LUMBAR SPINE FINDINGS Segmentation:  Standard. Alignment:  Physiologic. Vertebrae:  No fracture, evidence of discitis, or bone lesion. Conus medullaris: Extends to the L1 level and appears normal. Paraspinal and other soft tissues: Negative. Disc levels: L4-5: Moderate facet hypertrophy. No spinal canal or neural foraminal stenosis. L5-S1: Left eccentric disc bulge with mild left neural foraminal narrowing. There is no abnormal contrast  enhancement. IMPRESSION: 1. Normal appearance of the spinal cord. No evidence of demyelinating disease within the spine. 2. Mild cervical and lumbar degenerative disc disease with mild-to-moderate neural foraminal stenosis at C5-6, C6-7 and L5-S1. Electronically Signed   By: Deatra Robinson M.D.   On: 03/21/2018 13:27   Mr Thoracic Spine W Wo Contrast  Result Date: 03/21/2018 EXAM: MRI TOTAL SPINE WITHOUT AND WITH CONTRAST TECHNIQUE: Multisequence MR imaging of the spine from the cervical spine to the sacrum was performed prior to and following IV contrast administration. CONTRAST:  10 mL Gadavist COMPARISON:  None. FINDINGS: MRI CERVICAL SPINE FINDINGS Alignment: Physiologic. Vertebrae: No fracture, evidence of discitis, or bone lesion. Cord: Normal signal and morphology. Posterior Fossa, vertebral arteries, paraspinal tissues: Visualized posterior fossa is normal. Vertebral artery flow voids are preserved. No prevertebral soft tissue swelling. Disc levels: The C2-C5 levels are normal. C5-6: Bilateral uncovertebral hypertrophy with moderate neural  foraminal stenosis. C6-7: Mild uncovertebral hypertrophy with mild bilateral foraminal stenosis. C7-T1 is normal. MRI THORACIC SPINE FINDINGS Alignment:  Physiologic. Vertebrae: No fracture, evidence of discitis, or bone lesion. Cord:  Normal signal and morphology. Paraspinal and other soft tissues: Negative. Disc levels: No disc herniation or stenosis. MRI LUMBAR SPINE FINDINGS Segmentation:  Standard. Alignment:  Physiologic. Vertebrae:  No fracture, evidence of discitis, or bone lesion. Conus medullaris: Extends to the L1 level and appears normal. Paraspinal and other soft tissues: Negative. Disc levels: L4-5: Moderate facet hypertrophy. No spinal canal or neural foraminal stenosis. L5-S1: Left eccentric disc bulge with mild left neural foraminal narrowing. There is no abnormal contrast enhancement. IMPRESSION: 1. Normal appearance of the spinal cord. No evidence of demyelinating disease within the spine. 2. Mild cervical and lumbar degenerative disc disease with mild-to-moderate neural foraminal stenosis at C5-6, C6-7 and L5-S1. Electronically Signed   By: Deatra Robinson M.D.   On: 03/21/2018 13:27   Mr Lumbar Spine W Wo Contrast  Result Date: 03/21/2018 EXAM: MRI TOTAL SPINE WITHOUT AND WITH CONTRAST TECHNIQUE: Multisequence MR imaging of the spine from the cervical spine to the sacrum was performed prior to and following IV contrast administration. CONTRAST:  10 mL Gadavist COMPARISON:  None. FINDINGS: MRI CERVICAL SPINE FINDINGS Alignment: Physiologic. Vertebrae: No fracture, evidence of discitis, or bone lesion. Cord: Normal signal and morphology. Posterior Fossa, vertebral arteries, paraspinal tissues: Visualized posterior fossa is normal. Vertebral artery flow voids are preserved. No prevertebral soft tissue swelling. Disc levels: The C2-C5 levels are normal. C5-6: Bilateral uncovertebral hypertrophy with moderate neural foraminal stenosis. C6-7: Mild uncovertebral hypertrophy with mild bilateral  foraminal stenosis. C7-T1 is normal. MRI THORACIC SPINE FINDINGS Alignment:  Physiologic. Vertebrae: No fracture, evidence of discitis, or bone lesion. Cord:  Normal signal and morphology. Paraspinal and other soft tissues: Negative. Disc levels: No disc herniation or stenosis. MRI LUMBAR SPINE FINDINGS Segmentation:  Standard. Alignment:  Physiologic. Vertebrae:  No fracture, evidence of discitis, or bone lesion. Conus medullaris: Extends to the L1 level and appears normal. Paraspinal and other soft tissues: Negative. Disc levels: L4-5: Moderate facet hypertrophy. No spinal canal or neural foraminal stenosis. L5-S1: Left eccentric disc bulge with mild left neural foraminal narrowing. There is no abnormal contrast enhancement. IMPRESSION: 1. Normal appearance of the spinal cord. No evidence of demyelinating disease within the spine. 2. Mild cervical and lumbar degenerative disc disease with mild-to-moderate neural foraminal stenosis at C5-6, C6-7 and L5-S1. Electronically Signed   By: Deatra Robinson M.D.   On: 03/21/2018 13:27    ____________________________________________   PROCEDURES  Procedure(s) performed:   Procedures   ____________________________________________   INITIAL IMPRESSION / ASSESSMENT AND PLAN / ED COURSE  Likely MS flare. Unclear source? Brain? Cervical? Will d/w neurology regarding appropriate imaging.   Imaging unremarkable.  It was found to have urinary tract infection.  Suspect this is related to her generalized weakness.  Clear with the pains coming from is not so better with the Dilaudid will give another dose we will give her steroids and start antibiotics.  Still with significant weakness in her leg so will admit to hospital for further management until she is back to baseline.     Pertinent labs & imaging results that were available during my care of the patient were reviewed by me and considered in my medical decision making (see chart for  details).  ____________________________________________  FINAL CLINICAL IMPRESSION(S) / ED DIAGNOSES  Final diagnoses:  None     MEDICATIONS GIVEN DURING THIS VISIT:  Medications  cefTRIAXone (ROCEPHIN) 1 g in sodium chloride 0.9 % 100 mL IVPB (1 g Intravenous New Bag/Given 03/21/18 1518)  HYDROmorphone (DILAUDID) injection 1 mg (has no administration in time range)  HYDROmorphone (DILAUDID) injection 1 mg (1 mg Intravenous Given 03/21/18 0859)  lactated ringers bolus 1,000 mL (0 mLs Intravenous Stopped 03/21/18 1009)  LORazepam (ATIVAN) injection 1 mg (1 mg Intravenous Given 03/21/18 1032)  gadobutrol (GADAVIST) 1 MMOL/ML injection 10 mL (10 mLs Intravenous Contrast Given 03/21/18 1239)  methylPREDNISolone sodium succinate (SOLU-MEDROL) 125 mg/2 mL injection 125 mg (125 mg Intravenous Given 03/21/18 1418)     NEW OUTPATIENT MEDICATIONS STARTED DURING THIS VISIT:  New Prescriptions   No medications on file    Note:  This note was prepared with assistance of Dragon voice recognition software. Occasional wrong-word or sound-a-like substitutions may have occurred due to the inherent limitations of voice recognition software.   Marily Memos, MD 03/21/18 [id number]

## 2018-03-21 NOTE — ED Notes (Signed)
Patient had a dinner tray with fish and salad. Patient reports she can not eat the salad due to the type of lettuce, and the fish was to spicy. Therefore, ordered patient a chicken sandwich, potato soup, ice cream, and cake.

## 2018-03-21 NOTE — ED Notes (Signed)
Patient is resting comfortably. Patient unable to provide urine sample.

## 2018-03-21 NOTE — ED Notes (Addendum)
Patient unable to void. [name] performed In and out cath on patient. Patient had 1000 mL of urine in bladder. MD made aware.

## 2018-03-21 NOTE — Progress Notes (Signed)
PHARMACIST - PHYSICIAN ORDER COMMUNICATION  CONCERNING: P&T Medication Policy on Herbal Medications  DESCRIPTION:  This patient's order for:  Biotin  has been noted.  This product(s) is classified as an "herbal" or natural product. Due to a lack of definitive safety studies or FDA approval, nonstandard manufacturing practices, plus the potential risk of unknown drug-drug interactions while on inpatient medications, the Pharmacy and Therapeutics Committee does not permit the use of "herbal" or natural products of this type within [place].   ACTION TAKEN: The pharmacy department is unable to verify this order at this time and your patient has been informed of this safety policy. Please reevaluate patient's clinical condition at discharge and address if the herbal or natural product(s) should be resumed at that time.  Chamaine Stankus, PharmD 

## 2018-03-21 NOTE — ED Notes (Addendum)
Patient is currently with MRI staff.

## 2018-03-21 NOTE — ED Notes (Addendum)
PT is in MRI  will obtain vitals when pt return

## 2018-03-21 NOTE — ED Notes (Signed)
Patient transported to MRI 

## 2018-03-21 NOTE — H&P (Signed)
History and Physical    [name] VPX:[id number] DOB: Oct 05, 1965 DOA: 03/21/2018  PCP: Iona Hansen, NP  Patient coming from: home    Chief Complaint: inability to walk, b/l leg pain  HPI: [name] is a 52 y.o. 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  Patient reports that yesterday she began to notice some lower extremity numbness and tingling.  This morning she noted profound lower extremity weakness.  She also noted for the past 2 days she has had increasing difficulty urinating having to sit on the toilet for several minutes.  She is also noted some constipation.  This morning she noted significant bilateral lower extremity numbness, tingling, pain.  This was different than her sciatic pain which is predominantly left-sided.  She also began to notice profound lower extremity weakness with inability to get up out of bed or walk.  She denied any loss of control of her bladder or bowel.  She denies any fevers though she reports a viral URI with cough and congestion approximately 1 week ago.  She also reports 1 week ago she had diarrhea which is still improving as well as some nonbilious nonbloody emesis.  She has not had any chest pain, syncope, presyncope, abdominal pain.  ED Course: In the ED patient's vitals were stable.  Labs are notable for mildly elevated creatinine 1.17 and mildly elevated blood glucose at 152.  Urinalysis was noted notable for positive nitrites.  Patient was noted to have 1 L urinary retention with an in and out cath.  MRI of the brain, cervical, lumbar, thoracic spine showed evidence of some stenosis but no cord signal change.  Neurology was consulted and reported they would evaluate the patient.  Review of Systems: As per HPI otherwise 10 point review of  systems negative.    Past Medical History:  Diagnosis Date  . Anxiety   . Asthma   . Depression   . Environmental allergies   . Fibromyalgia   . GERD (gastroesophageal reflux disease)   . Grave's disease   . Headache    migraines  . High cholesterol   . Hypertension   . Sciatica   . Syncope and collapse   . Vasovagal syncope   . Vertigo     Past Surgical History:  Procedure Laterality Date  . ABDOMINAL HYSTERECTOMY  2006   partial  . GASTRIC BYPASS  2008     reports that she has never smoked. She has never used smokeless tobacco. She reports that she drinks about 1.0 standard drinks of alcohol per week. She reports that she does not use drugs.  Allergies  Allergen Reactions  . Bee Venom Anaphylaxis    Other reaction(s): PRURITUS, RASH  . Coconut Fatty Acids Anaphylaxis    Patient allergic to coconut in general  . Mushroom Ext Cmplx(Shiitake-Reishi-Mait) Anaphylaxis  . Nutritional Supplements Anaphylaxis and Itching    walnuts  . Other Anaphylaxis, Hives and Itching    Ragweed  . Shellfish Allergy Anaphylaxis  . Strawberry Extract Anaphylaxis  . Hydrocodone-Acetaminophen Itching    Family History  Problem Relation Age of Onset  . Hypertension Mother   . Cirrhosis Mother   . Alcohol abuse Mother   . Depression Mother   . Physical abuse Mother   . Hypertension Sister   . Alcohol abuse Sister   . Drug  abuse Sister   . Depression Sister   . Anxiety disorder Sister   . OCD Sister   . Hypertension Father   . Alcohol abuse Father   . ADD / ADHD Other   . Depression Sister   . Diabetes Other   . Hypertension Other   . Diabetes Maternal Uncle   . Seizures Cousin    Unacceptable: Noncontributory, unremarkable, or negative. Acceptable: Family history reviewed and not pertinent (If you reviewed it)  Prior to Admission medications   Medication Sig Start Date End Date Taking? Authorizing Provider  albuterol (PROVENTIL HFA;VENTOLIN HFA) 108 (90 BASE) MCG/ACT  inhaler Inhale 2 puffs into the lungs every 6 (six) hours as needed for wheezing.   Yes [provider]  amitriptyline (ELAVIL) 50 MG tablet Take 150 mg by mouth at bedtime.  03/01/18  Yes [provider]  ARIPiprazole (ABILIFY) 10 MG tablet Take 1 tablet (10 mg total) by mouth daily. 01/12/18  Yes Oletta Darter, MD  azelastine (ASTELIN) 137 MCG/SPRAY nasal spray Place 1 spray into the nose 2 (two) times daily. Use in each nostril as directed   Yes [provider]  BIOTIN 5000 PO Take 5,000 Units by mouth daily.    Yes [provider]  budesonide-formoterol (SYMBICORT) 80-4.5 MCG/ACT inhaler Inhale 2 puffs into the lungs 2 (two) times daily.   Yes [provider]  cetirizine (ZYRTEC) 10 MG tablet Take 10 mg by mouth daily.   Yes [provider]  cholecalciferol (VITAMIN D) 1000 UNITS tablet Take 1,000 Units by mouth daily.    Yes [provider]  clonazePAM (KLONOPIN) 0.5 MG tablet Take 1 tablet (0.5 mg total) by mouth 2 (two) times daily as needed for anxiety. 01/12/18  Yes Oletta Darter, MD  cyclobenzaprine (FLEXERIL) 10 MG tablet Take 10 mg by mouth 2 (two) times daily as needed. msucle spasms 02/01/18  Yes [provider]  diclofenac sodium (VOLTAREN) 1 % GEL Apply 2 g topically 4 (four) times daily - after meals and at bedtime. 02/28/18  Yes [provider]  DULoxetine (CYMBALTA) 30 MG capsule Take 3 capsules (90 mg total) by mouth daily. 01/12/18  Yes Oletta Darter, MD  Epinastine HCl (ELESTAT) 0.05 % ophthalmic solution Place 1 drop into both eyes 2 (two) times daily as needed (itching).    Yes [provider]  EPINEPHrine (EPIPEN) 0.3 mg/0.3 mL DEVI Inject 0.3 mg into the muscle as needed. For allergic reaction to stinging insects, mushrooms, and shellfish    Yes [provider]  estradiol (ESTRACE) 0.5 MG tablet Take 0.5 mg by mouth daily.   Yes [provider]  fluconazole (DIFLUCAN)  200 MG tablet Take 200 mg by mouth daily. 03/09/18  Yes [provider]  fluticasone (FLONASE) 50 MCG/ACT nasal spray Place 1 spray into both nostrils as needed (allergies/runny nose).    Yes [provider]  gabapentin (NEURONTIN) 300 MG capsule Take 300 mg by mouth 2 (two) times daily.   Yes [provider]  ibuprofen (ADVIL,MOTRIN) 800 MG tablet Take 800 mg by mouth every 8 (eight) hours as needed for moderate pain.   Yes [provider]  meclizine (ANTIVERT) 25 MG tablet Take 1 tablet (25 mg total) by mouth every 6 (six) hours as needed for dizziness. 10/12/13  Yes Purvis Sheffield, MD  Menthol-Methyl Salicylate (BEN GAY GREASELESS) 10-15 % greaseless cream Apply 1 application topically 3 (three) times daily as needed for pain.   Yes [provider]  mometasone-formoterol (DULERA) 100-5 MCG/ACT AERO Inhale 2 puffs into the lungs 2 (two) times daily as needed for wheezing or shortness of breath.    Yes [provider]  montelukast (SINGULAIR) 10 MG tablet Take 1 tablet (10 mg total) by mouth at bedtime. 02/13/15  Yes Danelle Berry, PA-C  Multiple Vitamins-Minerals (MULTIVITAMIN WITH MINERALS) tablet Take 1 tablet by mouth daily.   Yes [provider]  Oxycodone HCl 10 MG TABS Take 10 mg by mouth 2 (two) times daily as needed. 03/17/18  Yes [provider]  oxymetazoline (AFRIN NASAL SPRAY) 0.05 % nasal spray Place 1 spray into both nostrils 2 (two) times daily. Patient taking differently: Place 1 spray into both nostrils 2 (two) times daily as needed for congestion.  07/07/15  Yes Barrett Henle, PA-C  polyethylene glycol [facility] / GLYCOLAX) packet Take 17 g by mouth every other day.    Yes [provider]  ranitidine (ZANTAC) 150 MG tablet Take 150 mg by mouth daily.   Yes [provider]  vitamin B-12 (CYANOCOBALAMIN) 100 MCG tablet Take 100 mcg by mouth daily.   Yes [provider]    Erenumab-aooe (AIMOVIG) 140 MG/ML SOAJ Inject 140 mg into the skin every 30 (thirty) days. Patient not taking: Reported on 03/21/2018 11/16/17   Anson Fret, MD  oxyCODONE-acetaminophen (PERCOCET/ROXICET) 5-325 MG per tablet Take one every 6 hours if needed for cough Patient not taking: Reported on 03/21/2018 06/12/14   Peyton Najjar, MD  SUMAtriptan (IMITREX) 100 MG tablet Take 1 tablet (100 mg total) by mouth once as needed for up to 1 dose for migraine. May repeat in 2 hours if headache persists or recurs. Patient not taking: Reported on 03/21/2018 04/12/17   Anson Fret, MD  zaleplon (SONATA) 10 MG capsule Take one at bedtime. May repeat if necessary Patient not taking: Reported on 03/21/2018 01/12/18   Oletta Darter, MD    Physical Exam: Vitals:   03/21/18 1348 03/21/18 1400 03/21/18 1430 03/21/18 1500  BP: 114/72 117/70 109/80 120/78  Pulse: 95 95 92 94  Resp: 19     Temp:      TempSrc:      SpO2: 98% 98% 96% 97%    Constitutional: NAD, calm, comfortable Vitals:   03/21/18 1348 03/21/18 1400 03/21/18 1430 03/21/18 1500  BP: 114/72 117/70 109/80 120/78  Pulse: 95 95 92 94  Resp: 19     Temp:      TempSrc:      SpO2: 98% 98% 96% 97%   Eyes: PERRL, lids and conjunctivae normal ENMT: Moist mucous membranes, good dentition Neck: normal, supple, pickwickian Respiratory: clear to auscultation bilaterally, no wheezing, no crackles. Normal respiratory effort. No accessory muscle use.  Cardiovascular: Regular rate and rhythm, no murmurs Abdomen: Soft, nontender, no rebound or guarding, plus bowel sounds.  Musculoskeletal: 2+ lower extremity edema.  Skin: no rashes over visible skin Neurologic: Cranial nerves II through XII intact, bilateral upper extremity strength 5 out of 5, 1+ to 2+ reflexes bilateral upper extremities, intact sensation in bilateral upper extremities, bilateral lower extremities extremely tender to touch, reports subjective paresthesias,  hyperalgesia noted bilaterally, 3+ strength on large hip flexors, 3+ strength on plantar and dorsiflexion, 2+ reflexes on right, 1+ on left.  Psychiatric: Normal judgment and insight. Alert and oriented x 3. Normal mood.    Labs on Admission: I have personally reviewed following labs and imaging studies  CBC: Recent Labs  Lab 03/21/18 0850  WBC  9.2  NEUTROABS 7.3  HGB 11.9*  HCT 40.5  MCV 93.3  PLT 274   Basic Metabolic Panel: Recent Labs  Lab 03/21/18 0850  NA 139  K 4.9  CL 105  CO2 23  GLUCOSE 152*  BUN 15  CREATININE 1.17*  CALCIUM 9.1   GFR: CrCl cannot be calculated (Unknown ideal weight.). Liver Function Tests: Recent Labs  Lab 03/21/18 0850  AST 22  ALT 15  ALKPHOS 85  BILITOT 0.2*  PROT 8.1  ALBUMIN 4.2   No results for input(s): LIPASE, AMYLASE in the last 168 hours. No results for input(s): AMMONIA in the last 168 hours. Coagulation Profile: No results for input(s): INR, PROTIME in the last 168 hours. Cardiac Enzymes: No results for input(s): CKTOTAL, CKMB, CKMBINDEX, TROPONINI in the last 168 hours. BNP (last 3 results) No results for input(s): PROBNP in the last 8760 hours. HbA1C: No results for input(s): HGBA1C in the last 72 hours. CBG: No results for input(s): GLUCAP in the last 168 hours. Lipid Profile: No results for input(s): CHOL, HDL, LDLCALC, TRIG, CHOLHDL, LDLDIRECT in the last 72 hours. Thyroid Function Tests: No results for input(s): TSH, T4TOTAL, FREET4, T3FREE, THYROIDAB in the last 72 hours. Anemia Panel: No results for input(s): VITAMINB12, FOLATE, FERRITIN, TIBC, IRON, RETICCTPCT in the last 72 hours. Urine analysis:    Component Value Date/Time   COLORURINE YELLOW 03/21/2018 1404   APPEARANCEUR CLEAR 03/21/2018 1404   LABSPEC 1.010 03/21/2018 1404   PHURINE 5.0 03/21/2018 1404   GLUCOSEU NEGATIVE 03/21/2018 1404   HGBUR NEGATIVE 03/21/2018 1404   BILIRUBINUR NEGATIVE 03/21/2018 1404   BILIRUBINUR neg 05/06/2014 1941    KETONESUR NEGATIVE 03/21/2018 1404   PROTEINUR NEGATIVE 03/21/2018 1404   UROBILINOGEN 0.2 02/13/2015 1502   NITRITE POSITIVE (A) 03/21/2018 1404   LEUKOCYTESUR NEGATIVE 03/21/2018 1404    Radiological Exams on Admission: Mr Brain W And Wo Contrast  Result Date: 03/21/2018 CLINICAL DATA:  Lower extremity weakness. EXAM: MRI HEAD WITHOUT AND WITH CONTRAST TECHNIQUE: Multiplanar, multiecho pulse sequences of the brain and surrounding structures were obtained without and with intravenous contrast. CONTRAST:  10 mL Gadavist COMPARISON:  Brain MRI 11/09/2017 FINDINGS: BRAIN: There is no acute infarct, acute hemorrhage, hydrocephalus or extra-axial collection. The midline structures are normal. No midline shift or other mass effect. There are no old infarcts. There are a few clustered foci of hyperintense T2-weighted signal in the left frontal subcortical white matter and right periatrial white matter, but this is a nonspecific finding and commonly seen in normal patients in this age range. On the prior scan, the apparent greater number of lesions was probably due partially to pulsation artifacts. The cerebral and cerebellar volume are age-appropriate. Susceptibility-sensitive sequences show no chronic microhemorrhage or superficial siderosis. VASCULAR: Major intracranial arterial and venous sinus flow voids are normal. SKULL AND UPPER CERVICAL SPINE: Calvarial bone marrow signal is normal. There is no skull base mass. Visualized upper cervical spine and soft tissues are normal. SINUSES/ORBITS: No fluid levels or advanced mucosal thickening. No mastoid or middle ear effusion. The orbits are normal. IMPRESSION: Normal aging brain. Electronically Signed   By: Deatra Robinson M.D.   On: 03/21/2018 13:47   Mr Cervical Spine W Or Wo Contrast  Result Date: 03/21/2018 EXAM: MRI TOTAL SPINE WITHOUT AND WITH CONTRAST TECHNIQUE: Multisequence MR imaging of the spine from the cervical spine to the sacrum was  performed prior to and following IV contrast administration. CONTRAST:  10 mL Gadavist COMPARISON:  None. FINDINGS:  MRI CERVICAL SPINE FINDINGS Alignment: Physiologic. Vertebrae: No fracture, evidence of discitis, or bone lesion. Cord: Normal signal and morphology. Posterior Fossa, vertebral arteries, paraspinal tissues: Visualized posterior fossa is normal. Vertebral artery flow voids are preserved. No prevertebral soft tissue swelling. Disc levels: The C2-C5 levels are normal. C5-6: Bilateral uncovertebral hypertrophy with moderate neural foraminal stenosis. C6-7: Mild uncovertebral hypertrophy with mild bilateral foraminal stenosis. C7-T1 is normal. MRI THORACIC SPINE FINDINGS Alignment:  Physiologic. Vertebrae: No fracture, evidence of discitis, or bone lesion. Cord:  Normal signal and morphology. Paraspinal and other soft tissues: Negative. Disc levels: No disc herniation or stenosis. MRI LUMBAR SPINE FINDINGS Segmentation:  Standard. Alignment:  Physiologic. Vertebrae:  No fracture, evidence of discitis, or bone lesion. Conus medullaris: Extends to the L1 level and appears normal. Paraspinal and other soft tissues: Negative. Disc levels: L4-5: Moderate facet hypertrophy. No spinal canal or neural foraminal stenosis. L5-S1: Left eccentric disc bulge with mild left neural foraminal narrowing. There is no abnormal contrast enhancement. IMPRESSION: 1. Normal appearance of the spinal cord. No evidence of demyelinating disease within the spine. 2. Mild cervical and lumbar degenerative disc disease with mild-to-moderate neural foraminal stenosis at C5-6, C6-7 and L5-S1. Electronically Signed   By: Deatra Robinson M.D.   On: 03/21/2018 13:27   Mr Thoracic Spine W Wo Contrast  Result Date: 03/21/2018 EXAM: MRI TOTAL SPINE WITHOUT AND WITH CONTRAST TECHNIQUE: Multisequence MR imaging of the spine from the cervical spine to the sacrum was performed prior to and following IV contrast administration. CONTRAST:  10 mL  Gadavist COMPARISON:  None. FINDINGS: MRI CERVICAL SPINE FINDINGS Alignment: Physiologic. Vertebrae: No fracture, evidence of discitis, or bone lesion. Cord: Normal signal and morphology. Posterior Fossa, vertebral arteries, paraspinal tissues: Visualized posterior fossa is normal. Vertebral artery flow voids are preserved. No prevertebral soft tissue swelling. Disc levels: The C2-C5 levels are normal. C5-6: Bilateral uncovertebral hypertrophy with moderate neural foraminal stenosis. C6-7: Mild uncovertebral hypertrophy with mild bilateral foraminal stenosis. C7-T1 is normal. MRI THORACIC SPINE FINDINGS Alignment:  Physiologic. Vertebrae: No fracture, evidence of discitis, or bone lesion. Cord:  Normal signal and morphology. Paraspinal and other soft tissues: Negative. Disc levels: No disc herniation or stenosis. MRI LUMBAR SPINE FINDINGS Segmentation:  Standard. Alignment:  Physiologic. Vertebrae:  No fracture, evidence of discitis, or bone lesion. Conus medullaris: Extends to the L1 level and appears normal. Paraspinal and other soft tissues: Negative. Disc levels: L4-5: Moderate facet hypertrophy. No spinal canal or neural foraminal stenosis. L5-S1: Left eccentric disc bulge with mild left neural foraminal narrowing. There is no abnormal contrast enhancement. IMPRESSION: 1. Normal appearance of the spinal cord. No evidence of demyelinating disease within the spine. 2. Mild cervical and lumbar degenerative disc disease with mild-to-moderate neural foraminal stenosis at C5-6, C6-7 and L5-S1. Electronically Signed   By: Deatra Robinson M.D.   On: 03/21/2018 13:27   Mr Lumbar Spine W Wo Contrast  Result Date: 03/21/2018 EXAM: MRI TOTAL SPINE WITHOUT AND WITH CONTRAST TECHNIQUE: Multisequence MR imaging of the spine from the cervical spine to the sacrum was performed prior to and following IV contrast administration. CONTRAST:  10 mL Gadavist COMPARISON:  None. FINDINGS: MRI CERVICAL SPINE FINDINGS Alignment:  Physiologic. Vertebrae: No fracture, evidence of discitis, or bone lesion. Cord: Normal signal and morphology. Posterior Fossa, vertebral arteries, paraspinal tissues: Visualized posterior fossa is normal. Vertebral artery flow voids are preserved. No prevertebral soft tissue swelling. Disc levels: The C2-C5 levels are normal. C5-6: Bilateral uncovertebral hypertrophy with moderate neural  foraminal stenosis. C6-7: Mild uncovertebral hypertrophy with mild bilateral foraminal stenosis. C7-T1 is normal. MRI THORACIC SPINE FINDINGS Alignment:  Physiologic. Vertebrae: No fracture, evidence of discitis, or bone lesion. Cord:  Normal signal and morphology. Paraspinal and other soft tissues: Negative. Disc levels: No disc herniation or stenosis. MRI LUMBAR SPINE FINDINGS Segmentation:  Standard. Alignment:  Physiologic. Vertebrae:  No fracture, evidence of discitis, or bone lesion. Conus medullaris: Extends to the L1 level and appears normal. Paraspinal and other soft tissues: Negative. Disc levels: L4-5: Moderate facet hypertrophy. No spinal canal or neural foraminal stenosis. L5-S1: Left eccentric disc bulge with mild left neural foraminal narrowing. There is no abnormal contrast enhancement. IMPRESSION: 1. Normal appearance of the spinal cord. No evidence of demyelinating disease within the spine. 2. Mild cervical and lumbar degenerative disc disease with mild-to-moderate neural foraminal stenosis at C5-6, C6-7 and L5-S1. Electronically Signed   By: Deatra Robinson M.D.   On: 03/21/2018 13:27    EKG: Independently reviewed. pending  Assessment/Plan Active Problems:   Graves' disease   GERD (gastroesophageal reflux disease)   Severe 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 (HCC)   GAD (generalized anxiety disorder)   Panic disorder with agoraphobia   Social anxiety disorder   Bipolar 2 disorder (HCC)   Intractable chronic migraine without aura and without status migrainosus  #) Lower  extremity weakness/pain: Unclear etiology.  She does have some reflexes though they are asymmetric though this is unclear if this is related to sciatica.  She definitely appears to be hyperreflexic but she is a relatively larger woman and so I think it is difficult to completely evaluate.  With a normal MRI and have low suspicion for other transverse myelitis or acute cauda equina syndrome.  This does raise the concern for possible ascending paralysis syndromes.  Additionally with her profound psychiatric history would also consider further evaluation for possible conversion disorder.  While she does carry diagnosis of multiple sclerosis her MRI findings are not consistent with this as she does not have the classic lesions in time and space.  Certainly the urinalysis due to the urinary retention could be contributing. -Neurology consult -We will send off anti-ganglioside antibody -Physical therapy consult  -EMG unlikely to be helpful in the acute setting but will defer to neurology  #) Possible UTI: Patient's urine noted to have positive nitrites. -Follow-up urine culture ordered 03/21/2018 -Continue IV ceftriaxone 1 g daily -Fully ordered  #) Questionable multiple sclerosis diagnosis: Patient has no evidence of demyelinating lesions on imaging.  It is unclear where this diagnosis comes from.  Reportedly according to the MRI read of the brain the signal changes seen by likely not unexpected for age. -We will discuss with neurology  #) Moderate persistent asthma/allergy: -Continue PRN bronchodilators -Continue oral antihistamine -Continue LABA/ICS -Continue montelukast 10 mg daily  #) Migraine disorder: -Continue PRN sumatriptan  #) Chronic pain disorder: -Continue gabapentin 300 mg twice daily -Continue PRN oxycodone 10 mg  -continue duloxetine 90 mg daily  #) Bipolar 1 disorder/depression/anxiety: - Continue aripiprazole 10 mg daily -Continue amitriptyline 150 mg nightly -Duloxetine  per above -Continue PRN clonazepam  Fluids: Gentle IV fluids for 1 day Elect lites: Monitor and supplement Nutrition: Regular diet  Prophylaxis: Enoxaparin  Disposition: Pending physical therapy and neurology evaluation and etiology of underlying weakness  Full code     Delaine Lame MD [facility]  If 7PM-7AM, please contact night-coverage [email or web address] Password [facility]  03/21/2018, 3:43 PM

## 2018-03-21 NOTE — Progress Notes (Addendum)
Bladder scans shows pt has 606 ml in bladder.  In & out cath'd pt. 1100 ml output.

## 2018-03-22 DIAGNOSIS — R29898 Other symptoms and signs involving the musculoskeletal system: Secondary | ICD-10-CM | POA: Diagnosis not present

## 2018-03-22 DIAGNOSIS — R531 Weakness: Secondary | ICD-10-CM | POA: Diagnosis not present

## 2018-03-22 LAB — CBC
HCT: 37 % (ref 36.0–46.0)
Hemoglobin: 11.3 g/dL — ABNORMAL LOW (ref 12.0–15.0)
MCH: 27.7 pg (ref 26.0–34.0)
MCHC: 30.5 g/dL (ref 30.0–36.0)
MCV: 90.7 fL (ref 80.0–100.0)
Platelets: 256 K/uL (ref 150–400)
RBC: 4.08 MIL/uL (ref 3.87–5.11)
RDW: 14 % (ref 11.5–15.5)
WBC: 7.5 K/uL (ref 4.0–10.5)
nRBC: 0 % (ref 0.0–0.2)

## 2018-03-22 LAB — BASIC METABOLIC PANEL WITH GFR
Anion gap: 5 (ref 5–15)
BUN: 12 mg/dL (ref 6–20)
CO2: 25 mmol/L (ref 22–32)
Calcium: 8.6 mg/dL — ABNORMAL LOW (ref 8.9–10.3)
Chloride: 110 mmol/L (ref 98–111)
Creatinine, Ser: 0.7 mg/dL (ref 0.44–1.00)
GFR calc Af Amer: >60 mL/min
GFR calc non Af Amer: >60 mL/min
Glucose, Bld: 120 mg/dL — ABNORMAL HIGH (ref 70–99)
Potassium: 4.3 mmol/L (ref 3.5–5.1)
Sodium: 140 mmol/L (ref 135–145)

## 2018-03-22 LAB — CK: Total CK: 76 U/L (ref 38–234)

## 2018-03-22 MED ORDER — HYDROXYZINE HCL 25 MG PO TABS: 25.0000 mg | ORAL_TABLET | Freq: Once | ORAL | Status: DC

## 2018-03-22 NOTE — Progress Notes (Addendum)
Subjective: Patient states that there is no difference over the night with the pain in the weakness.  Upon walking in the room she used only her arms to pull herself up in the bed.  Today she tells me this is the third time this is happened to her in the last time she was sent to an orthopedic specialist but does not recall where and at that time she was told she had arthritis the knees and a partial tear in her Achilles tendon on the left.  Looking back on 12/25/2015 she was seen by a psychiatrist at that time as she also was describing positive joint pain, back pain and myalgias.  She also describe that she was dropping things with her hands.  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  And the only neurology notes I have seen our for migraines.  At this time patient states that she has not been out of bed.  Exam: Vitals:   03/22/18 0558 03/22/18 0845  BP: 140/83   Pulse: 92   Resp: 20   Temp: 98 F (36.7 C)   SpO2: 99% 95%    Physical Exam   HEENT-  Normocephalic, no lesions, without obvious abnormality.  Normal external eye and conjunctiva.   Extremities- Warm, dry and intact, pins-and-needles and pain in the lower extremities Musculoskeletal-no joint tenderness, deformity or swelling Skin-warm and dry, no hyperpigmentation, vitiligo, or suspicious lesions    Neuro:  Mental Status: Alert, oriented, thought content appropriate.  Speech fluent without evidence of aphasia.  Able to follow 3 step commands without difficulty. Cranial Nerves: II:  Visual fields grossly normal,  III,IV, VI: ptosis not present, extra-ocular motions intact bilaterally pupils equal, round, reactive to light and accommodation V,VII: smile symmetric, facial light touch sensation  normal bilaterally VIII: hearing normal bilaterally IX,X: uvula rises midline XI: bilateral shoulder shrug XII: midline tongue extension Motor: Right : Upper extremity   5/5    Left:     Upper extremity   5/5  Lower extremity   5/5     Lower extremity   5/5 Tone and bulk:normal tone throughout; no atrophy noted Sensory: Allodynia of feet up to just below the knee with no change since yesterday Deep Tendon Reflexes: 2+ and symmetric throughout without Achilles reflexes Plantars: Right: downgoing   Left: downgoing Cerebellar: normal finger-to-nose,      Medications:  Scheduled: . amitriptyline  150 mg Oral QHS  . ARIPiprazole  10 mg Oral Daily  . azelastine  1 spray Each Nare BID  . cholecalciferol  1,000 Units Oral Daily  . diclofenac sodium  2 g Topical TID PC & HS  . DULoxetine  90 mg Oral Daily  . enoxaparin (LOVENOX) injection  40 mg Subcutaneous Q24H  . estradiol  0.5 mg Oral Daily  . famotidine  10 mg Oral BID  . gabapentin  300 mg Oral BID  . ketotifen  1 drop Both Eyes BID  . loratadine  10 mg Oral Daily  . mometasone-formoterol  2 puff Inhalation BID  . montelukast  10 mg Oral QHS  . multivitamin with minerals  1 tablet Oral Daily  . polyethylene glycol  17 g Oral QODAY  . vitamin B-12  100 mcg Oral Daily  Pertinent Labs/Diagnostics: -B1 pending -B12 529 -C-reactive protein less than 0.8 - ESR 46     Jhoana Upham PA-C [facility] [phone]  I have seen the patient and reviewed the above note on my exam, she does have some improvement in her ability to lift legs out of bed.  Assessment: 52 year old female with bilateral lower extremity weakness and allodynia of unclear etiology.  Patient does have preserved patella and upper extremity reflexes which makes GBS much less likely.  She appears to be improving, I do suspect there may be some psychogenic overlay.  The borderline elevation in her ESR is of uncertain significance, but if this were  a vasculitic neuropathy I would expect a  more profound elevation.   Recommendations: -PT, OT - We will order a CK due to the elevated ESR. - We will follow labs.  Roland Rack, MD [facility] [phone]  If 7pm- 7am, please page neurology on call as listed in [place].  03/22/2018, 9:04 AM

## 2018-03-22 NOTE — Care Management Obs Status (Signed)
MEDICARE OBSERVATION STATUS NOTIFICATION   Patient Details  Name: [name] MRN: [id number] Date of Birth: 1966/01/04   Medicare Observation Status Notification Given:  Yes    Bartholome Bill, RN 03/22/2018, 3:41 PM

## 2018-03-22 NOTE — Care Management CC44 (Deleted)
Condition Code 44 Documentation Completed  Patient Details  Name: DANIALLE DEMENT MRN: [id number] Date of Birth: 05/20/1966   Condition Code 44 given:    Patient signature on Condition Code 44 notice:    Documentation of 2 MD's agreement:    Code 44 added to claim:       Bartholome Bill, RN 03/22/2018, 3:42 PM

## 2018-03-22 NOTE — Progress Notes (Signed)
Patient suffers from dizziness and difficulty ambulating which impairs their ability to perform daily activities like ambulating in the home.  A walker alone will not resolve the issues with performing activities of daily living. A wheelchair will allow patient to safely perform daily activities.  The patient can self propel in the home or has a caregiver who can provide assistance.   Ralene Bathe Kistler PT 03/22/2018  Acute Rehabilitation Services Pager [phone] Office [phone]

## 2018-03-22 NOTE — Care Management CC44 (Signed)
Condition Code 44 Documentation Completed  Patient Details  Name: [name] MRN: [id number] Date of Birth: [birth year]   Condition Code 44 given:  Yes Patient signature on Condition Code 44 notice:  Yes Documentation of 2 MD's agreement:  Yes Code 44 added to claim:  Yes    Bartholome Bill, RN 03/22/2018, 3:43 PM

## 2018-03-22 NOTE — Progress Notes (Signed)
PROGRESS NOTE    [name]  FDV:[id number] DOB: 14-May-1966 DOA: 03/21/2018 PCP: Iona Hansen, NP    Brief Narrative:  52 y.o. 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   Assessment & Plan:   Active Problems:   Graves' disease   GERD (gastroesophageal reflux disease)   Severe 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 (HCC)   GAD (generalized anxiety disorder)   Panic disorder with agoraphobia   Social anxiety disorder   Bipolar 2 disorder (HCC)   Intractable chronic migraine without aura and without status migrainosus   Leg weakness   #) Lower extremity weakness/pain: Unclear etiology.  No clear syndrome clearly stands out per neurology.  Differential includes conversion disorder versus very atypical Guillain-Barr syndrome versus small fiber neuropathy.  Unclear how much the urinary retention is playing a role versus multiple medications.. -Neurology consult -Physical therapy consult  -EMG per neurology could possibly be helpful  #) Possible UTI/urinary retention: Patient's urine noted to have positive nitrites.  Suspect urinary retention is most likely due to multiple medications. -Follow-up urine culture ordered 03/21/2018 -Continue IV ceftriaxone 1 g daily started 03/21/2018 -Foley catheter ordered  #) Questionable multiple sclerosis diagnosis: Patient reports a history of multiple sclerosis however MRI imaging here shows changes consistent with age and/or related to possibly chronic migraines which patient also has a history of -We will discuss with neurology  #) Moderate persistent asthma/allergy: -Continue PRN bronchodilators -Continue oral antihistamine -Continue LABA/ICS -Continue  montelukast 10 mg daily  #) Migraine disorder: -Continue PRN sumatriptan  #) Chronic pain disorder: -Continue gabapentin 300 mg twice daily -Continue PRN oxycodone 10 mg  -continue duloxetine 90 mg daily  #) Bipolar 1 disorder/depression/anxiety: - Continue aripiprazole 10 mg daily -Continue amitriptyline 150 mg nightly -Duloxetine per above -Continue PRN clonazepam  Fluids: Tolerating p.o. Elect lites: Monitor and supplement Nutrition: Regular diet  Prophylaxis: Enoxaparin  Disposition: Pending physical therapy and further neurology evaluation  Full code    Consultants:   Neurology  Procedures:   None  Antimicrobials:   IV ceftriaxone started 03/21/2018   Subjective: This morning patient reports that her symptoms are relatively unchanged.  She continues to report significant paresthesias and allodynia of the lower extremity.  She also has some hyperalgesia.  She denies any nausea, vomiting, diarrhea, cough, congestion.  Objective: Vitals:   03/21/18 2114 03/22/18 0558 03/22/18 0845 03/22/18 1020  BP: 124/77 140/83  117/76  Pulse: 96 92  99  Resp: 20 20    Temp: 98.3 F (36.8 C) 98 F (36.7 C)    TempSrc: Oral Oral    SpO2: 96% 99% 95%   Weight: 111.9 kg     Height: 5\' 6"  (1.676 m)       Intake/Output Summary (Last 24 hours) at 03/22/2018 1120 Last data filed at 03/22/2018 1054 Gross per 24 hour  Intake 900 ml  Output 3525 ml  Net -2625 ml   Filed Weights   03/21/18 2114  Weight: 111.9 kg    Examination:  General exam: Appears calm and comfortable  Respiratory system: Clear to auscultation. Respiratory effort normal. Cardiovascular system: Distant heart sounds, regular rate and rhythm, no murmurs. Gastrointestinal system: Soft, nondistended, no rebound or guarding. Central nervous system: Alert and oriented. Cranial nerves II through XII intact, bilateral upper extremity strength  5 out of 5, 1+ to 2+ reflexes bilateral upper  extremities, intact sensation in bilateral upper extremities, bilateral lower extremities extremely tender to touch, reports subjective paresthesias, hyperalgesia noted bilaterally, 3+ strength on large hip flexors, 3+ strength on plantar and dorsiflexion, 2+ reflexes on right, 1+ on left. . Extremities: 1+ lower extremity edema Skin: No rashes over visible skin Psychiatry: Judgement and insight appear normal. Mood & affect appropriate.     Data Reviewed: I have personally reviewed following labs and imaging studies  CBC: Recent Labs  Lab 03/21/18 0850 03/22/18 0410  WBC 9.2 7.5  NEUTROABS 7.3  --   HGB 11.9* 11.3*  HCT 40.5 37.0  MCV 93.3 90.7  PLT 274 256   Basic Metabolic Panel: Recent Labs  Lab 03/21/18 0850 03/22/18 0410  NA 139 140  K 4.9 4.3  CL 105 110  CO2 23 25  GLUCOSE 152* 120*  BUN 15 12  CREATININE 1.17* 0.70  CALCIUM 9.1 8.6*   GFR: Estimated Creatinine Clearance: 101.9 mL/min (by C-G formula based on SCr of 0.7 mg/dL). Liver Function Tests: Recent Labs  Lab 03/21/18 0850  AST 22  ALT 15  ALKPHOS 85  BILITOT 0.2*  PROT 8.1  ALBUMIN 4.2   No results for input(s): LIPASE, AMYLASE in the last 168 hours. No results for input(s): AMMONIA in the last 168 hours. Coagulation Profile: No results for input(s): INR, PROTIME in the last 168 hours. Cardiac Enzymes: Recent Labs  Lab 03/22/18 0939  CKTOTAL 76   BNP (last 3 results) No results for input(s): PROBNP in the last 8760 hours. HbA1C: No results for input(s): HGBA1C in the last 72 hours. CBG: No results for input(s): GLUCAP in the last 168 hours. Lipid Profile: No results for input(s): CHOL, HDL, LDLCALC, TRIG, CHOLHDL, LDLDIRECT in the last 72 hours. Thyroid Function Tests: No results for input(s): TSH, T4TOTAL, FREET4, T3FREE, THYROIDAB in the last 72 hours. Anemia Panel: Recent Labs    03/21/18 2110  VITAMINB12 529   Sepsis Labs: No results for input(s): PROCALCITON, LATICACIDVEN  in the last 168 hours.  No results found for this or any previous visit (from the past 240 hour(s)).       Radiology Studies: Mr Laqueta Jean And Wo Contrast  Result Date: 03/21/2018 CLINICAL DATA:  Lower extremity weakness. EXAM: MRI HEAD WITHOUT AND WITH CONTRAST TECHNIQUE: Multiplanar, multiecho pulse sequences of the brain and surrounding structures were obtained without and with intravenous contrast. CONTRAST:  10 mL Gadavist COMPARISON:  Brain MRI 11/09/2017 FINDINGS: BRAIN: There is no acute infarct, acute hemorrhage, hydrocephalus or extra-axial collection. The midline structures are normal. No midline shift or other mass effect. There are no old infarcts. There are a few clustered foci of hyperintense T2-weighted signal in the left frontal subcortical white matter and right periatrial white matter, but this is a nonspecific finding and commonly seen in normal patients in this age range. On the prior scan, the apparent greater number of lesions was probably due partially to pulsation artifacts. The cerebral and cerebellar volume are age-appropriate. Susceptibility-sensitive sequences show no chronic microhemorrhage or superficial siderosis. VASCULAR: Major intracranial arterial and venous sinus flow voids are normal. SKULL AND UPPER CERVICAL SPINE: Calvarial bone marrow signal is normal. There is no skull base mass. Visualized upper cervical spine and soft tissues are normal. SINUSES/ORBITS: No fluid levels or advanced mucosal thickening. No mastoid or middle ear effusion. The orbits are normal. IMPRESSION: Normal aging brain. Electronically Signed   By: Caryn Bee  Chase Picket M.D.   On: 03/21/2018 13:47   Mr Cervical Spine W Or Wo Contrast  Result Date: 03/21/2018 EXAM: MRI TOTAL SPINE WITHOUT AND WITH CONTRAST TECHNIQUE: Multisequence MR imaging of the spine from the cervical spine to the sacrum was performed prior to and following IV contrast administration. CONTRAST:  10 mL Gadavist COMPARISON:  None.  FINDINGS: MRI CERVICAL SPINE FINDINGS Alignment: Physiologic. Vertebrae: No fracture, evidence of discitis, or bone lesion. Cord: Normal signal and morphology. Posterior Fossa, vertebral arteries, paraspinal tissues: Visualized posterior fossa is normal. Vertebral artery flow voids are preserved. No prevertebral soft tissue swelling. Disc levels: The C2-C5 levels are normal. C5-6: Bilateral uncovertebral hypertrophy with moderate neural foraminal stenosis. C6-7: Mild uncovertebral hypertrophy with mild bilateral foraminal stenosis. C7-T1 is normal. MRI THORACIC SPINE FINDINGS Alignment:  Physiologic. Vertebrae: No fracture, evidence of discitis, or bone lesion. Cord:  Normal signal and morphology. Paraspinal and other soft tissues: Negative. Disc levels: No disc herniation or stenosis. MRI LUMBAR SPINE FINDINGS Segmentation:  Standard. Alignment:  Physiologic. Vertebrae:  No fracture, evidence of discitis, or bone lesion. Conus medullaris: Extends to the L1 level and appears normal. Paraspinal and other soft tissues: Negative. Disc levels: L4-5: Moderate facet hypertrophy. No spinal canal or neural foraminal stenosis. L5-S1: Left eccentric disc bulge with mild left neural foraminal narrowing. There is no abnormal contrast enhancement. IMPRESSION: 1. Normal appearance of the spinal cord. No evidence of demyelinating disease within the spine. 2. Mild cervical and lumbar degenerative disc disease with mild-to-moderate neural foraminal stenosis at C5-6, C6-7 and L5-S1. Electronically Signed   By: Deatra Robinson M.D.   On: 03/21/2018 13:27   Mr Thoracic Spine W Wo Contrast  Result Date: 03/21/2018 EXAM: MRI TOTAL SPINE WITHOUT AND WITH CONTRAST TECHNIQUE: Multisequence MR imaging of the spine from the cervical spine to the sacrum was performed prior to and following IV contrast administration. CONTRAST:  10 mL Gadavist COMPARISON:  None. FINDINGS: MRI CERVICAL SPINE FINDINGS Alignment: Physiologic. Vertebrae: No  fracture, evidence of discitis, or bone lesion. Cord: Normal signal and morphology. Posterior Fossa, vertebral arteries, paraspinal tissues: Visualized posterior fossa is normal. Vertebral artery flow voids are preserved. No prevertebral soft tissue swelling. Disc levels: The C2-C5 levels are normal. C5-6: Bilateral uncovertebral hypertrophy with moderate neural foraminal stenosis. C6-7: Mild uncovertebral hypertrophy with mild bilateral foraminal stenosis. C7-T1 is normal. MRI THORACIC SPINE FINDINGS Alignment:  Physiologic. Vertebrae: No fracture, evidence of discitis, or bone lesion. Cord:  Normal signal and morphology. Paraspinal and other soft tissues: Negative. Disc levels: No disc herniation or stenosis. MRI LUMBAR SPINE FINDINGS Segmentation:  Standard. Alignment:  Physiologic. Vertebrae:  No fracture, evidence of discitis, or bone lesion. Conus medullaris: Extends to the L1 level and appears normal. Paraspinal and other soft tissues: Negative. Disc levels: L4-5: Moderate facet hypertrophy. No spinal canal or neural foraminal stenosis. L5-S1: Left eccentric disc bulge with mild left neural foraminal narrowing. There is no abnormal contrast enhancement. IMPRESSION: 1. Normal appearance of the spinal cord. No evidence of demyelinating disease within the spine. 2. Mild cervical and lumbar degenerative disc disease with mild-to-moderate neural foraminal stenosis at C5-6, C6-7 and L5-S1. Electronically Signed   By: Deatra Robinson M.D.   On: 03/21/2018 13:27   Mr Lumbar Spine W Wo Contrast  Result Date: 03/21/2018 EXAM: MRI TOTAL SPINE WITHOUT AND WITH CONTRAST TECHNIQUE: Multisequence MR imaging of the spine from the cervical spine to the sacrum was performed prior to and following IV contrast administration. CONTRAST:  10 mL Gadavist  COMPARISON:  None. FINDINGS: MRI CERVICAL SPINE FINDINGS Alignment: Physiologic. Vertebrae: No fracture, evidence of discitis, or bone lesion. Cord: Normal signal and  morphology. Posterior Fossa, vertebral arteries, paraspinal tissues: Visualized posterior fossa is normal. Vertebral artery flow voids are preserved. No prevertebral soft tissue swelling. Disc levels: The C2-C5 levels are normal. C5-6: Bilateral uncovertebral hypertrophy with moderate neural foraminal stenosis. C6-7: Mild uncovertebral hypertrophy with mild bilateral foraminal stenosis. C7-T1 is normal. MRI THORACIC SPINE FINDINGS Alignment:  Physiologic. Vertebrae: No fracture, evidence of discitis, or bone lesion. Cord:  Normal signal and morphology. Paraspinal and other soft tissues: Negative. Disc levels: No disc herniation or stenosis. MRI LUMBAR SPINE FINDINGS Segmentation:  Standard. Alignment:  Physiologic. Vertebrae:  No fracture, evidence of discitis, or bone lesion. Conus medullaris: Extends to the L1 level and appears normal. Paraspinal and other soft tissues: Negative. Disc levels: L4-5: Moderate facet hypertrophy. No spinal canal or neural foraminal stenosis. L5-S1: Left eccentric disc bulge with mild left neural foraminal narrowing. There is no abnormal contrast enhancement. IMPRESSION: 1. Normal appearance of the spinal cord. No evidence of demyelinating disease within the spine. 2. Mild cervical and lumbar degenerative disc disease with mild-to-moderate neural foraminal stenosis at C5-6, C6-7 and L5-S1. Electronically Signed   By: Deatra Robinson M.D.   On: 03/21/2018 13:27        Scheduled Meds: . amitriptyline  150 mg Oral QHS  . ARIPiprazole  10 mg Oral Daily  . azelastine  1 spray Each Nare BID  . cholecalciferol  1,000 Units Oral Daily  . diclofenac sodium  2 g Topical TID PC & HS  . DULoxetine  90 mg Oral Daily  . enoxaparin (LOVENOX) injection  40 mg Subcutaneous Q24H  . estradiol  0.5 mg Oral Daily  . famotidine  10 mg Oral BID  . gabapentin  300 mg Oral BID  . ketotifen  1 drop Both Eyes BID  . loratadine  10 mg Oral Daily  . mometasone-formoterol  2 puff Inhalation BID    . montelukast  10 mg Oral QHS  . multivitamin with minerals  1 tablet Oral Daily  . polyethylene glycol  17 g Oral QODAY  . vitamin B-12  100 mcg Oral Daily   Continuous Infusions: . sodium chloride 125 mL/hr at 03/21/18 2250  . cefTRIAXone (ROCEPHIN)  IV       LOS: 1 day    Time spent: 35    Delaine Lame, MD [facility]  If 7PM-7AM, please contact night-coverage [email or web address] Password [email or web address] 03/22/2018, 11:20 AM

## 2018-03-22 NOTE — Evaluation (Addendum)
Physical Therapy Evaluation Patient Details Name: [name] MRN: [id number] DOB: 1965-10-28 Today's Date: 03/22/2018   History of Present Illness  51 y.o. female with past medical history of vertigo, syncope, hypertension, hypercholesterolemia, Graves' disease, fibromyalgia, anxiety.   She states over the past 3 weeks she is noticed that she has had difficulty holding objects and dropping objects.  Over the past 3 days she is noticed decreased strength in her legs, pins-and-needles in her feet up to her knees to the point where she cannot walk at this point because it is so painful.  Pt also presented with urinary retention.   Clinical Impression  Pt admitted with above diagnosis. Pt currently with functional limitations due to the deficits listed below (see PT Problem List). Mod assist for sit to stand x 2 trials, pt unable to fully weight bear on LLE 2* pain in foot. She is a high fall risk, she reports 5-6 falls in past 1 year 2* "syncope-vertigo". Somewhat difficult to get a clear picture of pt's baseline with mobility, she was lethargic 2* sleeping poorly and had some word finding difficulty at times. She reports a L achilles injury in February for which she wore a boot, but didn't have surgery; and that her daughter has helped her with mobility for the past 2 weeks. 24* assistance recommended, pt prefers home over [facility].  Pt will benefit from skilled PT to increase their independence and safety with mobility to allow discharge to the venue listed below.       Follow Up Recommendations SNF;Home health PT;Supervision/Assistance - 24 hour;Supervision for mobility/OOB    Equipment Recommendations  Wheelchair cushion (measurements PT);Wheelchair (measurements PT);Rolling walker with 5" wheels;3in1 (PT)    Recommendations for Other Services       Precautions / Restrictions Precautions Precautions: Fall Precaution Comments: pt reports 5-6 falls in past year, she attributes them to  "syncope/vertigo", pt reports she has medication for vertigo but is out of it (she thinks its meclazine) Restrictions Weight Bearing Restrictions: No      Mobility  Bed Mobility Overal bed mobility: Needs Assistance Bed Mobility: Supine to Sit;Sit to Supine     Supine to sit: HOB elevated;Supervision Sit to supine: Mod assist   General bed mobility comments: increased time to come to full upright position 2* dizziness; assist for LEs back into bed. Sitting BP 117/76. Pt reports h/o chronic vertigo/syncope.   Transfers Overall transfer level: Needs assistance Equipment used: 1 person hand held assist Transfers: Sit to/from Stand Sit to Stand: Mod assist         General transfer comment: assist to rise/steady, pt unweights LLE in standing 2* pain in L foot; sit to stand x 2 trials, pt stood x 30 seconds, tolerance limited by pain. Pt reports dizziness in standing but not able to stand long enough to check BP.   Ambulation/Gait                Stairs            Wheelchair Mobility    Modified Rankin (Stroke Patients Only)       Balance Overall balance assessment: Needs assistance Sitting-balance support: Feet supported;No upper extremity supported Sitting balance-Leahy Scale: Good     Standing balance support: Single extremity supported Standing balance-Leahy Scale: Poor Standing balance comment: relies on single UE support to unweight LLE  Pertinent Vitals/Pain Pain Assessment: 0-10 Pain Score: 7  Pain Location: B feet Pain Descriptors / Indicators: Pins and needles Pain Intervention(s): Limited activity within patient's tolerance;Monitored during session;Patient requesting pain meds-RN notified    Home Living Family/patient expects to be discharged to:: Private residence Living Arrangements: Children Available Help at Discharge: Family;Friend(s);Available 24 hours/day   Home Access: Level entry     Home  Layout: One level Home Equipment: Shower seat;Grab bars - tub/shower Additional Comments: daughter just moved in with pt 2 weeks ago and is there 65* but is looking for a job, pt reports her son and friends are also able to assist her at home    Prior Function Level of Independence: Needs assistance   Gait / Transfers Assistance Needed: daughter assisted with ambulation, pt reports she did not use an AD  ADL's / Homemaking Assistance Needed: daughter assisted        Hand Dominance        Extremity/Trunk Assessment   Upper Extremity Assessment Upper Extremity Assessment: Overall WFL for tasks assessed(reports tingling B hands)    Lower Extremity Assessment Lower Extremity Assessment: RLE deficits/detail;LLE deficits/detail RLE Deficits / Details: B ankle PF/DF AROM decreased 75% 2* pain, pain with light touch B feet L worse than R (pt reports h/o L achilles tear in February, no surgery but wore boot), B knee ext +4/5 RLE: Unable to fully assess due to pain LLE: Unable to fully assess due to pain    Cervical / Trunk Assessment Cervical / Trunk Assessment: Normal  Communication   Communication: Expressive difficulties(some word finding difficulty)  Cognition Arousal/Alertness: Awake/alert;Lethargic Behavior During Therapy: WFL for tasks assessed/performed Overall Cognitive Status: No family/caregiver present to determine baseline cognitive functioning                                 General Comments: oriented to self and location, can follow commands, some word finding difficulty, pt somewhat lethargic (she stated she slept poorly last night)      General Comments      Exercises General Exercises - Lower Extremity Ankle Circles/Pumps: AROM;Both;5 reps;Supine Quad Sets: (instructed pt in quad sets for independent exercise) Gluteal Sets: (instructed pt in gluteal sets for independent exercise)   Assessment/Plan    PT Assessment Patient needs continued PT  services  PT Problem List Decreased activity tolerance;Decreased range of motion;Decreased mobility;Decreased balance;Pain;Decreased knowledge of use of DME       PT Treatment Interventions DME instruction;Gait training;Therapeutic activities;Therapeutic exercise;Functional mobility training;Balance training;Patient/family education    PT Goals (Current goals can be found in the Care Plan section)  Acute Rehab PT Goals Patient Stated Goal: return home PT Goal Formulation: With patient Time For Goal Achievement: 04/05/18 Potential to Achieve Goals: Fair    Frequency Min 3X/week   Barriers to discharge        Co-evaluation               AM-PAC PT "6 Clicks" Daily Activity  Outcome Measure Difficulty turning over in bed (including adjusting bedclothes, sheets and blankets)?: A Little Difficulty moving from lying on back to sitting on the side of the bed? : A Little Difficulty sitting down on and standing up from a chair with arms (e.g., wheelchair, bedside commode, etc,.)?: Unable Help needed moving to and from a bed to chair (including a wheelchair)?: Total Help needed walking in hospital room?: Total Help needed climbing 3-5 steps with a  railing? : Total 6 Click Score: 10    End of Session   Activity Tolerance: Patient limited by pain;Patient limited by fatigue Patient left: in bed;with call bell/phone within reach;with bed alarm set Nurse Communication: Mobility status;Patient requests pain meds PT Visit Diagnosis: Unsteadiness on feet (R26.81);History of falling (Z91.81);Repeated falls (R29.6);Difficulty in walking, not elsewhere classified (R26.2);Pain;Dizziness and giddiness (R42) Pain - Right/Left: Left Pain - part of body: Ankle and joints of foot    Time: 4665-9935 PT Time Calculation (min) (ACUTE ONLY): 38 min   Charges:   PT Evaluation $PT Eval Moderate Complexity: 1 Mod PT Treatments $Therapeutic Activity: 23-37 mins       Ralene Bathe  [facility] PT 03/22/2018  Acute Rehabilitation Services Pager [phone] Office [phone]

## 2018-03-23 DIAGNOSIS — R29898 Other symptoms and signs involving the musculoskeletal system: Secondary | ICD-10-CM | POA: Diagnosis not present

## 2018-03-23 DIAGNOSIS — R531 Weakness: Secondary | ICD-10-CM | POA: Diagnosis not present

## 2018-03-23 LAB — HIV ANTIBODY (ROUTINE TESTING W REFLEX): HIV Screen 4th Generation wRfx: NONREACTIVE

## 2018-03-23 NOTE — Discharge Summary (Signed)
Physician Discharge Summary  [name] EUM:[id number] DOB: 1965-10-28 DOA: 03/21/2018  PCP: Iona Hansen, NP  Admit date: 03/21/2018 Discharge date: 03/23/2018  Admitted From: Home Disposition:  Home  Recommendations for Outpatient Follow-up:  1. Follow up with PCP in 1-2 weeks 2. Please obtain BMP/CBC in one week 3. Please follow up with neurology as an outpatient  Home Health: Yes, home health PT, OT, RN, aide Equipment/Devices: Yes, wheelchair, walker  Discharge Condition: Stable CODE STATUS: Full Diet recommendation: Regular  Brief/Interim Summary:  #) Lower extremity weakness, allodynia: She was admitted with bilateral lower extremity weakness and significant allodynia.  MRI of the brain, cervical, thoracic, lumbar spine was negative.  Is also noted to have subjective paresthesias.  She was evaluated by neurology who could find no clear clinical etiology for the symptoms.  Differential diagnosis included conversion disorder versus atypical Guyon Barr syndrome for small fiber neuropathy.  It was felt the patient might benefit from EMG if he was not improving however she began spontaneous improved.  Patient was evaluated by physical therapy recommended skilled nursing facility however patient opted to go home with home health PT.  Patient will be followed up as an outpatient with neurology for possible EMG if she needs it.  #) Possible UTI/urinary retention: Patient was noted to have urinary retention which was attributed to multiple medications.  Her Foley was pulled and she will follow-up as an outpatient with this.  She was initially on IV ceftriaxone however urine cultures were negative.  #) Questionable multiple sclerosis diagnosis: Patient reports questionable history of multiple sclerosis diagnosis.  MRI of the brain showed only expected changes for somebody of her age.  No clear stigmata of multiple sclerosis.  #) Moderate persistent asthma/allergies: Patient was  continued on PRN bronchodilators, oral antihistamine, montelukast, LABA/ICS.  #) 6 migraine disorder: Patient was continued on PRN symmetric pain.  #) Chronic pain disorder: Patient was getting a PRN oxycodone, duloxetine, gabapentin.  #)bipolar 1 disorder/Depression/anxiety: Patient was continued on home aripiprazole, amitriptyline, duloxetine, PRN clonazepam.  Discharge Diagnoses:  Active Problems:   Graves' disease   GERD (gastroesophageal reflux disease)   Severe 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 (HCC)   GAD (generalized anxiety disorder)   Panic disorder with agoraphobia   Social anxiety disorder   Bipolar 2 disorder (HCC)   Intractable chronic migraine without aura and without status migrainosus   Leg weakness    Discharge Instructions  Discharge Instructions    Call MD for:  difficulty breathing, headache or visual disturbances   Complete by:  As directed    Call MD for:  hives   Complete by:  As directed    Call MD for:  persistant dizziness or light-headedness   Complete by:  As directed    Call MD for:  persistant nausea and vomiting   Complete by:  As directed    Call MD for:  redness, tenderness, or signs of infection (pain, swelling, redness, odor or green/yellow discharge around incision site)   Complete by:  As directed    Call MD for:  severe uncontrolled pain   Complete by:  As directed    Call MD for:  temperature >100.4   Complete by:  As directed    Diet - low sodium heart healthy   Complete by:  As directed    Discharge instructions   Complete by:  As directed    Please follow-up with PCP in 1 week.  Please follow-up with your physical  therapist.  Please follow-up with neurology as an outpatient.   Increase activity slowly   Complete by:  As directed      Allergies as of 03/23/2018      Reactions   Bee Venom Anaphylaxis   Other reaction(s): PRURITUS, RASH   Coconut Fatty Acids Anaphylaxis   Patient allergic to coconut  in general   Mushroom Ext Cmplx(shiitake-reishi-mait) Anaphylaxis   Nutritional Supplements Anaphylaxis, Itching   walnuts   Other Anaphylaxis, Hives, Itching   Ragweed   Shellfish Allergy Anaphylaxis   Strawberry Extract Anaphylaxis   Hydrocodone-acetaminophen Itching      Medication List    TAKE these medications   albuterol 108 (90 Base) MCG/ACT inhaler Commonly known as:  PROVENTIL HFA;VENTOLIN HFA Inhale 2 puffs into the lungs every 6 (six) hours as needed for wheezing.   amitriptyline 50 MG tablet Commonly known as:  ELAVIL Take 150 mg by mouth at bedtime.   ARIPiprazole 10 MG tablet Commonly known as:  ABILIFY Take 1 tablet (10 mg total) by mouth daily.   azelastine 0.1 % nasal spray Commonly known as:  ASTELIN Place 1 spray into the nose 2 (two) times daily. Use in each nostril as directed   BEN GAY GREASELESS 10-15 % greaseless cream Apply 1 application topically 3 (three) times daily as needed for pain.   BIOTIN 5000 PO Take 5,000 Units by mouth daily.   budesonide-formoterol 80-4.5 MCG/ACT inhaler Commonly known as:  SYMBICORT Inhale 2 puffs into the lungs 2 (two) times daily.   cetirizine 10 MG tablet Commonly known as:  ZYRTEC Take 10 mg by mouth daily.   cholecalciferol 1000 units tablet Commonly known as:  VITAMIN D Take 1,000 Units by mouth daily.   clonazePAM 0.5 MG tablet Commonly known as:  KLONOPIN Take 1 tablet (0.5 mg total) by mouth 2 (two) times daily as needed for anxiety.   cyclobenzaprine 10 MG tablet Commonly known as:  FLEXERIL Take 10 mg by mouth 2 (two) times daily as needed. msucle spasms   diclofenac sodium 1 % Gel Commonly known as:  VOLTAREN Apply 2 g topically 4 (four) times daily - after meals and at bedtime.   DULoxetine 30 MG capsule Commonly known as:  CYMBALTA Take 3 capsules (90 mg total) by mouth daily.   ELESTAT 0.05 % ophthalmic solution Generic drug:  Epinastine HCl Place 1 drop into both eyes 2 (two)  times daily as needed (itching).   EPIPEN 0.3 mg/0.3 mL Devi Generic drug:  EPINEPHrine Inject 0.3 mg into the muscle as needed. For allergic reaction to stinging insects, mushrooms, and shellfish   Erenumab-aooe 140 MG/ML Soaj Inject 140 mg into the skin every 30 (thirty) days.   estradiol 0.5 MG tablet Commonly known as:  ESTRACE Take 0.5 mg by mouth daily.   fluconazole 200 MG tablet Commonly known as:  DIFLUCAN Take 200 mg by mouth daily.   fluticasone 50 MCG/ACT nasal spray Commonly known as:  FLONASE Place 1 spray into both nostrils as needed (allergies/runny nose).   gabapentin 300 MG capsule Commonly known as:  NEURONTIN Take 300 mg by mouth 2 (two) times daily.   ibuprofen 800 MG tablet Commonly known as:  ADVIL,MOTRIN Take 800 mg by mouth every 8 (eight) hours as needed for moderate pain.   meclizine 25 MG tablet Commonly known as:  ANTIVERT Take 1 tablet (25 mg total) by mouth every 6 (six) hours as needed for dizziness.   mometasone-formoterol 100-5 MCG/ACT Aero Commonly known as:  DULERA Inhale 2 puffs into the lungs 2 (two) times daily as needed for wheezing or shortness of breath.   montelukast 10 MG tablet Commonly known as:  SINGULAIR Take 1 tablet (10 mg total) by mouth at bedtime.   multivitamin with minerals tablet Take 1 tablet by mouth daily.   Oxycodone HCl 10 MG Tabs Take 10 mg by mouth 2 (two) times daily as needed.   oxyCODONE-acetaminophen 5-325 MG tablet Commonly known as:  PERCOCET/ROXICET Take one every 6 hours if needed for cough   oxymetazoline 0.05 % nasal spray Commonly known as:  AFRIN Place 1 spray into both nostrils 2 (two) times daily. What changed:    when to take this  reasons to take this   polyethylene glycol packet Commonly known as:  MIRALAX / GLYCOLAX Take 17 g by mouth every other day.   ranitidine 150 MG tablet Commonly known as:  ZANTAC Take 150 mg by mouth daily.   SUMAtriptan 100 MG tablet Commonly  known as:  IMITREX Take 1 tablet (100 mg total) by mouth once as needed for up to 1 dose for migraine. May repeat in 2 hours if headache persists or recurs.   vitamin B-12 100 MCG tablet Commonly known as:  CYANOCOBALAMIN Take 100 mcg by mouth daily.   zaleplon 10 MG capsule Commonly known as:  SONATA Take one at bedtime. May repeat if necessary            Durable Medical Equipment  (From admission, onward)         Start     Ordered   03/23/18 1254  For home use only DME standard manual wheelchair with seat cushion  (Wheelchairs)  Once    Comments:  Patient suffers from dressing, grooming and toiletinglower extremity weakness which impairs their ability to perform daily activities like bathing, dressing and toileting in the home.  A walker will not resolve  issue with performing activities of daily living. A wheelchair will allow patient to safely perform daily activities. Patient can safely propel the wheelchair in the home or has a caregiver who can provide assistance.  Accessories: elevating leg rests (ELRs), wheel locks, extensions and anti-tippers.   03/23/18 1254   03/23/18 1254  For home use only DME Walker rolling  [facility])  Once    Question:  Patient needs a walker to treat with the following condition  Answer:  Lower extremity weakness   03/23/18 1254   03/23/18 1135  For home use only DME Walker rolling  Once    Question:  Patient needs a walker to treat with the following condition  Answer:  Weakness   03/23/18 1134          Allergies  Allergen Reactions  . Bee Venom Anaphylaxis    Other reaction(s): PRURITUS, RASH  . Coconut Fatty Acids Anaphylaxis    Patient allergic to coconut in general  . Mushroom Ext Cmplx(Shiitake-Reishi-Mait) Anaphylaxis  . Nutritional Supplements Anaphylaxis and Itching    walnuts  . Other Anaphylaxis, Hives and Itching    Ragweed  . Shellfish Allergy Anaphylaxis  . Strawberry Extract Anaphylaxis  . Hydrocodone-Acetaminophen  Itching    Consultations:  Neurology   Procedures/Studies: Mr Laqueta Jean And Wo Contrast  Result Date: 03/21/2018 CLINICAL DATA:  Lower extremity weakness. EXAM: MRI HEAD WITHOUT AND WITH CONTRAST TECHNIQUE: Multiplanar, multiecho pulse sequences of the brain and surrounding structures were obtained without and with intravenous contrast. CONTRAST:  10 mL Gadavist COMPARISON:  Brain MRI 11/09/2017 FINDINGS:  BRAIN: There is no acute infarct, acute hemorrhage, hydrocephalus or extra-axial collection. The midline structures are normal. No midline shift or other mass effect. There are no old infarcts. There are a few clustered foci of hyperintense T2-weighted signal in the left frontal subcortical white matter and right periatrial white matter, but this is a nonspecific finding and commonly seen in normal patients in this age range. On the prior scan, the apparent greater number of lesions was probably due partially to pulsation artifacts. The cerebral and cerebellar volume are age-appropriate. Susceptibility-sensitive sequences show no chronic microhemorrhage or superficial siderosis. VASCULAR: Major intracranial arterial and venous sinus flow voids are normal. SKULL AND UPPER CERVICAL SPINE: Calvarial bone marrow signal is normal. There is no skull base mass. Visualized upper cervical spine and soft tissues are normal. SINUSES/ORBITS: No fluid levels or advanced mucosal thickening. No mastoid or middle ear effusion. The orbits are normal. IMPRESSION: Normal aging brain. Electronically Signed   By: Deatra Robinson M.D.   On: 03/21/2018 13:47   Mr Cervical Spine W Or Wo Contrast  Result Date: 03/21/2018 EXAM: MRI TOTAL SPINE WITHOUT AND WITH CONTRAST TECHNIQUE: Multisequence MR imaging of the spine from the cervical spine to the sacrum was performed prior to and following IV contrast administration. CONTRAST:  10 mL Gadavist COMPARISON:  None. FINDINGS: MRI CERVICAL SPINE FINDINGS Alignment: Physiologic.  Vertebrae: No fracture, evidence of discitis, or bone lesion. Cord: Normal signal and morphology. Posterior Fossa, vertebral arteries, paraspinal tissues: Visualized posterior fossa is normal. Vertebral artery flow voids are preserved. No prevertebral soft tissue swelling. Disc levels: The C2-C5 levels are normal. C5-6: Bilateral uncovertebral hypertrophy with moderate neural foraminal stenosis. C6-7: Mild uncovertebral hypertrophy with mild bilateral foraminal stenosis. C7-T1 is normal. MRI THORACIC SPINE FINDINGS Alignment:  Physiologic. Vertebrae: No fracture, evidence of discitis, or bone lesion. Cord:  Normal signal and morphology. Paraspinal and other soft tissues: Negative. Disc levels: No disc herniation or stenosis. MRI LUMBAR SPINE FINDINGS Segmentation:  Standard. Alignment:  Physiologic. Vertebrae:  No fracture, evidence of discitis, or bone lesion. Conus medullaris: Extends to the L1 level and appears normal. Paraspinal and other soft tissues: Negative. Disc levels: L4-5: Moderate facet hypertrophy. No spinal canal or neural foraminal stenosis. L5-S1: Left eccentric disc bulge with mild left neural foraminal narrowing. There is no abnormal contrast enhancement. IMPRESSION: 1. Normal appearance of the spinal cord. No evidence of demyelinating disease within the spine. 2. Mild cervical and lumbar degenerative disc disease with mild-to-moderate neural foraminal stenosis at C5-6, C6-7 and L5-S1. Electronically Signed   By: Deatra Robinson M.D.   On: 03/21/2018 13:27   Mr Thoracic Spine W Wo Contrast  Result Date: 03/21/2018 EXAM: MRI TOTAL SPINE WITHOUT AND WITH CONTRAST TECHNIQUE: Multisequence MR imaging of the spine from the cervical spine to the sacrum was performed prior to and following IV contrast administration. CONTRAST:  10 mL Gadavist COMPARISON:  None. FINDINGS: MRI CERVICAL SPINE FINDINGS Alignment: Physiologic. Vertebrae: No fracture, evidence of discitis, or bone lesion. Cord: Normal  signal and morphology. Posterior Fossa, vertebral arteries, paraspinal tissues: Visualized posterior fossa is normal. Vertebral artery flow voids are preserved. No prevertebral soft tissue swelling. Disc levels: The C2-C5 levels are normal. C5-6: Bilateral uncovertebral hypertrophy with moderate neural foraminal stenosis. C6-7: Mild uncovertebral hypertrophy with mild bilateral foraminal stenosis. C7-T1 is normal. MRI THORACIC SPINE FINDINGS Alignment:  Physiologic. Vertebrae: No fracture, evidence of discitis, or bone lesion. Cord:  Normal signal and morphology. Paraspinal and other soft tissues: Negative. Disc levels: No  disc herniation or stenosis. MRI LUMBAR SPINE FINDINGS Segmentation:  Standard. Alignment:  Physiologic. Vertebrae:  No fracture, evidence of discitis, or bone lesion. Conus medullaris: Extends to the L1 level and appears normal. Paraspinal and other soft tissues: Negative. Disc levels: L4-5: Moderate facet hypertrophy. No spinal canal or neural foraminal stenosis. L5-S1: Left eccentric disc bulge with mild left neural foraminal narrowing. There is no abnormal contrast enhancement. IMPRESSION: 1. Normal appearance of the spinal cord. No evidence of demyelinating disease within the spine. 2. Mild cervical and lumbar degenerative disc disease with mild-to-moderate neural foraminal stenosis at C5-6, C6-7 and L5-S1. Electronically Signed   By: Deatra Robinson M.D.   On: 03/21/2018 13:27   Mr Lumbar Spine W Wo Contrast  Result Date: 03/21/2018 EXAM: MRI TOTAL SPINE WITHOUT AND WITH CONTRAST TECHNIQUE: Multisequence MR imaging of the spine from the cervical spine to the sacrum was performed prior to and following IV contrast administration. CONTRAST:  10 mL Gadavist COMPARISON:  None. FINDINGS: MRI CERVICAL SPINE FINDINGS Alignment: Physiologic. Vertebrae: No fracture, evidence of discitis, or bone lesion. Cord: Normal signal and morphology. Posterior Fossa, vertebral arteries, paraspinal tissues:  Visualized posterior fossa is normal. Vertebral artery flow voids are preserved. No prevertebral soft tissue swelling. Disc levels: The C2-C5 levels are normal. C5-6: Bilateral uncovertebral hypertrophy with moderate neural foraminal stenosis. C6-7: Mild uncovertebral hypertrophy with mild bilateral foraminal stenosis. C7-T1 is normal. MRI THORACIC SPINE FINDINGS Alignment:  Physiologic. Vertebrae: No fracture, evidence of discitis, or bone lesion. Cord:  Normal signal and morphology. Paraspinal and other soft tissues: Negative. Disc levels: No disc herniation or stenosis. MRI LUMBAR SPINE FINDINGS Segmentation:  Standard. Alignment:  Physiologic. Vertebrae:  No fracture, evidence of discitis, or bone lesion. Conus medullaris: Extends to the L1 level and appears normal. Paraspinal and other soft tissues: Negative. Disc levels: L4-5: Moderate facet hypertrophy. No spinal canal or neural foraminal stenosis. L5-S1: Left eccentric disc bulge with mild left neural foraminal narrowing. There is no abnormal contrast enhancement. IMPRESSION: 1. Normal appearance of the spinal cord. No evidence of demyelinating disease within the spine. 2. Mild cervical and lumbar degenerative disc disease with mild-to-moderate neural foraminal stenosis at C5-6, C6-7 and L5-S1. Electronically Signed   By: Deatra Robinson M.D.   On: 03/21/2018 13:27       Subjective:   Discharge Exam: Vitals:   03/22/18 2144 03/23/18 0534  BP: 122/64 123/70  Pulse: (!) 102 80  Resp: 16 20  Temp: 98.2 F (36.8 C) 98.1 F (36.7 C)  SpO2: 96% 99%   Vitals:   03/22/18 1250 03/22/18 2030 03/22/18 2144 03/23/18 0534  BP: 110/63  122/64 123/70  Pulse: 96  (!) 102 80  Resp: 16  16 20   Temp:   98.2 F (36.8 C) 98.1 F (36.7 C)  TempSrc:   Oral Oral  SpO2: 98% 95% 96% 99%  Weight:      Height:       General exam: Appears calm and comfortable  Respiratory system: Clear to auscultation. Respiratory effort normal. Cardiovascular system:  Distant heart sounds, regular rate and rhythm, no murmurs. Gastrointestinal system: Soft, nondistended, no rebound or guarding. Central nervous system: Alert and oriented. Cranial nerves II through XII intact, bilateral upper extremity strength 5 out of 5, 1+ to 2+ reflexes bilateral upper extremities, intact sensation in bilateral upper extremities, bilateral lower extremities extremely tender to touch, reports subjective paresthesias, hyperalgesia noted bilaterally, 3+ strength on large hip flexors, 3+ strength on plantar and dorsiflexion, 2+ reflexes  on right, 1+ on left. . Extremities: 1+ lower extremity edema Skin: No rashes over visible skin Psychiatry: Judgement and insight appear normal. Mood & affect appropriate.      The results of significant diagnostics from this hospitalization (including imaging, microbiology, ancillary and laboratory) are listed below for reference.     Microbiology: Recent Results (from the past 240 hour(s))  Urine culture     Status: Abnormal (Preliminary result)   Collection Time: 03/21/18  2:04 PM  Result Value Ref Range Status   Specimen Description   Final    URINE, RANDOM Performed at [facility], [address], [place], [place] [address]    Special Requests   Final    NONE Performed at [facility], [address] [place], [place] [address]    Culture (A)  Final    >=100,000 COLONIES/mL ESCHERICHIA COLI SUSCEPTIBILITIES TO FOLLOW Performed at [facility], [address], [place], [place] [address]    Report Status PENDING  Incomplete     Labs: BNP (last 3 results) No results for input(s): BNP in the last 8760 hours. Basic Metabolic Panel: Recent Labs  Lab 03/21/18 0850 03/22/18 0410  NA 139 140  K 4.9 4.3  CL 105 110  CO2 23 25  GLUCOSE 152* 120*  BUN 15 12  CREATININE 1.17* 0.70  CALCIUM 9.1 8.6*   Liver Function Tests: Recent Labs  Lab 03/21/18 0850  AST 22  ALT 15   ALKPHOS 85  BILITOT 0.2*  PROT 8.1  ALBUMIN 4.2   No results for input(s): LIPASE, AMYLASE in the last 168 hours. No results for input(s): AMMONIA in the last 168 hours. CBC: Recent Labs  Lab 03/21/18 0850 03/22/18 0410  WBC 9.2 7.5  NEUTROABS 7.3  --   HGB 11.9* 11.3*  HCT 40.5 37.0  MCV 93.3 90.7  PLT 274 256   Cardiac Enzymes: Recent Labs  Lab 03/22/18 0939  CKTOTAL 76   BNP: Invalid input(s): POCBNP CBG: No results for input(s): GLUCAP in the last 168 hours. D-Dimer No results for input(s): DDIMER in the last 72 hours. Hgb A1c No results for input(s): HGBA1C in the last 72 hours. Lipid Profile No results for input(s): CHOL, HDL, LDLCALC, TRIG, CHOLHDL, LDLDIRECT in the last 72 hours. Thyroid function studies No results for input(s): TSH, T4TOTAL, T3FREE, THYROIDAB in the last 72 hours.  Invalid input(s): FREET3 Anemia work up Recent Labs    03/21/18 2110  VITAMINB12 529   Urinalysis    Component Value Date/Time   COLORURINE YELLOW 03/21/2018 1404   APPEARANCEUR CLEAR 03/21/2018 1404   LABSPEC 1.010 03/21/2018 1404   PHURINE 5.0 03/21/2018 1404   GLUCOSEU NEGATIVE 03/21/2018 1404   HGBUR NEGATIVE 03/21/2018 1404   BILIRUBINUR NEGATIVE 03/21/2018 1404   BILIRUBINUR neg 05/06/2014 1941   KETONESUR NEGATIVE 03/21/2018 1404   PROTEINUR NEGATIVE 03/21/2018 1404   UROBILINOGEN 0.2 02/13/2015 1502   NITRITE POSITIVE (A) 03/21/2018 1404   LEUKOCYTESUR NEGATIVE 03/21/2018 1404   Sepsis Labs Invalid input(s): PROCALCITONIN,  WBC,  LACTICIDVEN Microbiology Recent Results (from the past 240 hour(s))  Urine culture     Status: Abnormal (Preliminary result)   Collection Time: 03/21/18  2:04 PM  Result Value Ref Range Status   Specimen Description   Final    URINE, RANDOM Performed at [facility], [address], [place], [place] [address]    Special Requests   Final    NONE Performed at  [facility] [place] [facility], [address], [place], [place] [address]    Culture (A)  Final    >=100,000 COLONIES/mL ESCHERICHIA COLI SUSCEPTIBILITIES TO FOLLOW Performed at [facility], [address] [place], [place] [address]    Report Status PENDING  Incomplete     Time coordinating discharge: 35  SIGNED:   Delaine Lame, MD  [facility] 03/23/2018, 12:54 PM  If 7PM-7AM, please contact night-coverage [email or web address] Password [email or web address]

## 2018-03-23 NOTE — Discharge Instructions (Signed)
Asymptomatic Bacteriuria Asymptomatic bacteriuria is the presence of a large number of bacteria in the urine without the usual symptoms of burning or frequent urination. What are the causes? This condition is caused by an increase in bacteria in the urine. This increase can be caused by:  Bacteria entering the urinary tract, such as during sex.  A blockage in the urinary tract, such as from kidney stones or a tumor.  Bladder problems that prevent the bladder from emptying.  What increases the risk? You are more likely to develop this condition if:  You have diabetes mellitus.  You are an elderly adult, especially if you are also in a long-term care facility.  You are pregnant and in the first trimester.  You have kidney stones.  You are female.  You have had a kidney transplant.  You have a leaky kidney tube valve (reflux).  You had a urinary catheter for a long period of time.  What are the signs or symptoms? There are no symptoms of this condition. How is this diagnosed? This condition is diagnosed with a urine test. Because this condition does not cause symptoms, it is usually diagnosed when a urine sample is taken to treat or diagnose another condition, such as pregnancy or kidney problems. Most women who are in their first trimester of pregnancy are screened for asymptomatic bacteriuria. How is this treated? Usually, treatment is not needed for this condition. Treating the condition can lead to other problems, such as a yeast infection or the growth of bacteria that do not respond to treatment (antibiotic-resistant bacteria). Some people, such as pregnant women and people with kidney transplants, do need treatment with antibiotic medicines to prevent kidney infection (pyelonephritis). In pregnant women, kidney infection can lead to premature labor, fetal growth restriction, or newborn death. Follow these instructions at home: Medicines  Take over-the-counter and  prescription medicines only as told by your health care provider.  If you were prescribed an antibiotic medicine, take it as told by your health care provider. Do not stop taking the antibiotic even if you start to feel better. General instructions  Monitor your condition for any changes.  Drink enough fluid to keep your urine clear or pale yellow.  Go to the bathroom more often to keep your bladder empty.  If you are female, keep the area around your vagina and rectum clean. Wipe yourself from front to back after urinating.  Keep all follow-up visits as told by your health care provider. This is important. Contact a health care provider if:  You notice any new symptoms, such as back pain or burning while urinating. Get help right away if:  You develop signs of an infection such as: ? A burning sensation when you urinate. ? Have pain when you urinate. ? Develop an intense need to urinate. ? Urinating more frequently. ? Back pain or pelvic pain. ? Fever or chills.  You have blood in your urine.  Your urine becomes discolored or cloudy.  Your urine smells bad.  You have severe pain that cannot be controlled with medicine. Summary  Asymptomatic bacteriuria is the presence of a large number of bacteria in the urine without the usual symptoms of burning or frequent urination.  Usually, treatment is not needed for this condition. Treating the condition can lead to other problems, such as too much yeast and the growth of antibiotic-resistant bacteria.  Some people, such as pregnant women and people with kidney transplants, do need treatment with antibiotic medicines to prevent   kidney infection (pyelonephritis).  If you were prescribed an antibiotic medicine, take it as told by your health care provider. Do not stop taking the antibiotic even if you start to feel better. This information is not intended to replace advice given to you by your health care provider. Make sure you  discuss any questions you have with your health care provider. Document Released: 05/10/2005 Document Revised: 05/04/2016 Document Reviewed: 05/04/2016 Elsevier Interactive Patient Education  2017 [employer]  

## 2018-03-23 NOTE — Care Management (Signed)
Pt declines SNF and wants to go home with home health. Choice offered and [facility] chosen. [facility] rep alerted of referral for HHPT/OT/RN. Will need MD orders. Also request RW for home. Sandford Craze RN,BSN [phone]

## 2018-03-23 NOTE — Progress Notes (Signed)
Subjective: Patient feels as though she is improving.  She states that she has less pain on her feet.  On her right leg the tingling and pins-and-needles have decreased down to her mid calf, left leg is the same.  When mentioned that if PT can get her to walk likely she can go home as she likely needs a EMG nerve conduction study as an outpatient.  When I mentioned that the other option would be going to [facility] seeing that we have done most of what we can do in her labs have come back negative she immediately discontent about that recommendation.  Patient continues to return to the idea that this is all due to her torn Achilles tendon.  I have explained to her is not due to her past torn Achilles tendon as it is bilaterally and paresthesias and allodynia do not occur with an Achilles tendon tear.  Exam: Vitals:   03/22/18 2144 03/23/18 0534  BP: 122/64 123/70  Pulse: (!) 102 80  Resp: 16 20  Temp: 98.2 F (36.8 C) 98.1 F (36.7 C)  SpO2: 96% 99%    Physical Exam   HEENT-  Normocephalic, no lesions, without obvious abnormality.  Normal external eye and conjunctiva.   Extremities- Warm, dry and intact-significant allodynia of lower extremities which has improved Musculoskeletal-no joint tenderness, deformity or swelling Skin-warm and dry, no hyperpigmentation, vitiligo, or suspicious lesions    Neuro:  Mental Status: Alert, oriented, thought content appropriate.  Speech fluent without evidence of aphasia.  Able to follow 3 step commands without difficulty. Cranial Nerves: II:  Visual fields grossly normal,  III,IV, VI: ptosis not present, extra-ocular motions intact bilaterally pupils equal, round, reactive to light and accommodation V,VII: smile symmetric, facial light touch sensation normal bilaterally VIII: hearing normal bilaterally IX,X: uvula rises midline XI: bilateral shoulder shrug XII: midline tongue extension Motor: Right : Upper extremity   5/5    Left:     Upper  extremity   5/5  Lower extremity   5/5     Lower extremity   5/5 Tone and bulk:normal tone throughout; no atrophy noted Sensory: Allodynia of bilateral legs has improved especially on the right leg.  When I touch the bottom of her feet she no longer winces in pain and pulls away. Deep Tendon Reflexes: 2+ and symmetric throughout with no Achilles reflex Plantars: Right: downgoing   Left: downgoing Cerebellar: normal finger-to-nose,  normal heel-to-shin test Gait: Did not test but states that PT is only guided to the edge the bed but not walked with her.    Medications:  Prior to Admission:  Medications Prior to Admission  Medication Sig Dispense Refill Last Dose  . albuterol (PROVENTIL HFA;VENTOLIN HFA) 108 (90 BASE) MCG/ACT inhaler Inhale 2 puffs into the lungs every 6 (six) hours as needed for wheezing.   03/20/2018 at Unknown time  . amitriptyline (ELAVIL) 50 MG tablet Take 150 mg by mouth at bedtime.   1 03/20/2018 at Unknown time  . ARIPiprazole (ABILIFY) 10 MG tablet Take 1 tablet (10 mg total) by mouth daily. 30 tablet 0 03/20/2018 at Unknown time  . azelastine (ASTELIN) 137 MCG/SPRAY nasal spray Place 1 spray into the nose 2 (two) times daily. Use in each nostril as directed   Past Week at Unknown time  . BIOTIN 5000 PO Take 5,000 Units by mouth daily.    03/20/2018 at Unknown time  . budesonide-formoterol (SYMBICORT) 80-4.5 MCG/ACT inhaler Inhale 2 puffs into the lungs 2 (two)  times daily.   Past Week at Unknown time  . cetirizine (ZYRTEC) 10 MG tablet Take 10 mg by mouth daily.   03/20/2018 at Unknown time  . cholecalciferol (VITAMIN D) 1000 UNITS tablet Take 1,000 Units by mouth daily.    03/20/2018 at Unknown time  . clonazePAM (KLONOPIN) 0.5 MG tablet Take 1 tablet (0.5 mg total) by mouth 2 (two) times daily as needed for anxiety. 60 tablet 0 Past Week at Unknown time  . cyclobenzaprine (FLEXERIL) 10 MG tablet Take 10 mg by mouth 2 (two) times daily as needed. msucle spasms  2  Past Week at Unknown time  . diclofenac sodium (VOLTAREN) 1 % GEL Apply 2 g topically 4 (four) times daily - after meals and at bedtime.  1 03/20/2018 at Unknown time  . DULoxetine (CYMBALTA) 30 MG capsule Take 3 capsules (90 mg total) by mouth daily. 90 capsule 0 03/20/2018 at Unknown time  . Epinastine HCl (ELESTAT) 0.05 % ophthalmic solution Place 1 drop into both eyes 2 (two) times daily as needed (itching).    Unknown  . EPINEPHrine (EPIPEN) 0.3 mg/0.3 mL DEVI Inject 0.3 mg into the muscle as needed. For allergic reaction to stinging insects, mushrooms, and shellfish    July 2019  . estradiol (ESTRACE) 0.5 MG tablet Take 0.5 mg by mouth daily.   03/20/2018 at Unknown time  . fluconazole (DIFLUCAN) 200 MG tablet Take 200 mg by mouth daily.  0 03/21/2018 at Unknown time  . fluticasone (FLONASE) 50 MCG/ACT nasal spray Place 1 spray into both nostrils as needed (allergies/runny nose).    03/20/2018 at Unknown time  . gabapentin (NEURONTIN) 300 MG capsule Take 300 mg by mouth 2 (two) times daily.   03/20/2018 at Unknown time  . ibuprofen (ADVIL,MOTRIN) 800 MG tablet Take 800 mg by mouth every 8 (eight) hours as needed for moderate pain.   03/21/2018 at Unknown time  . meclizine (ANTIVERT) 25 MG tablet Take 1 tablet (25 mg total) by mouth every 6 (six) hours as needed for dizziness. 20 tablet 0 Needs refill  . Menthol-Methyl Salicylate (BEN GAY GREASELESS) 10-15 % greaseless cream Apply 1 application topically 3 (three) times daily as needed for pain.   Past Week at Unknown time  . mometasone-formoterol (DULERA) 100-5 MCG/ACT AERO Inhale 2 puffs into the lungs 2 (two) times daily as needed for wheezing or shortness of breath.    03/16/2018  . montelukast (SINGULAIR) 10 MG tablet Take 1 tablet (10 mg total) by mouth at bedtime. 30 tablet 0 03/20/2018 at Unknown time  . Multiple Vitamins-Minerals (MULTIVITAMIN WITH MINERALS) tablet Take 1 tablet by mouth daily.   03/21/2018 at Unknown time  . Oxycodone  HCl 10 MG TABS Take 10 mg by mouth 2 (two) times daily as needed.   03/20/2018 at Unknown time  . oxymetazoline (AFRIN NASAL SPRAY) 0.05 % nasal spray Place 1 spray into both nostrils 2 (two) times daily. (Patient taking differently: Place 1 spray into both nostrils 2 (two) times daily as needed for congestion. ) 30 mL 0 Past Week at Unknown time  . polyethylene glycol (MIRALAX / GLYCOLAX) packet Take 17 g by mouth every other day.    03/20/2018 at Unknown time  . ranitidine (ZANTAC) 150 MG tablet Take 150 mg by mouth daily.   Past Week at Unknown time  . vitamin B-12 (CYANOCOBALAMIN) 100 MCG tablet Take 100 mcg by mouth daily.   03/20/2018 at Unknown time  . Erenumab-aooe (AIMOVIG) 140 MG/ML SOAJ Inject  140 mg into the skin every 30 (thirty) days. (Patient not taking: Reported on 03/21/2018) 1 pen 11 Not Taking at Unknown time  . oxyCODONE-acetaminophen (PERCOCET/ROXICET) 5-325 MG per tablet Take one every 6 hours if needed for cough (Patient not taking: Reported on 03/21/2018) 12 tablet 0 Completed Course at Unknown time  . SUMAtriptan (IMITREX) 100 MG tablet Take 1 tablet (100 mg total) by mouth once as needed for up to 1 dose for migraine. May repeat in 2 hours if headache persists or recurs. (Patient not taking: Reported on 03/21/2018) 9 tablet 12 Not Taking at Unknown time  . zaleplon (SONATA) 10 MG capsule Take one at bedtime. May repeat if necessary (Patient not taking: Reported on 03/21/2018) 60 capsule 0 Not Taking at Unknown time   Scheduled: . amitriptyline  150 mg Oral QHS  . ARIPiprazole  10 mg Oral Daily  . azelastine  1 spray Each Nare BID  . cholecalciferol  1,000 Units Oral Daily  . diclofenac sodium  2 g Topical TID PC & HS  . DULoxetine  90 mg Oral Daily  . enoxaparin (LOVENOX) injection  40 mg Subcutaneous Q24H  . estradiol  0.5 mg Oral Daily  . famotidine  10 mg Oral BID  . gabapentin  300 mg Oral BID  . hydrOXYzine  25 mg Oral Once  . ketotifen  1 drop Both Eyes BID  .  loratadine  10 mg Oral Daily  . mometasone-formoterol  2 puff Inhalation BID  . montelukast  10 mg Oral QHS  . multivitamin with minerals  1 tablet Oral Daily  . polyethylene glycol  17 g Oral QODAY  . vitamin B-12  100 mcg Oral Daily   Continuous: . cefTRIAXone (ROCEPHIN)  IV 1 g (03/22/18 1417)    Pertinent Labs/Diagnostics:      Mabrey Howland PA-C [facility] [phone]   Assessment: 52 year old female with bilateral lower extremity weakness which is not observed during formal exam along with allodynia of unclear etiology.  As noted prior likely not GBS as she has good reflexes.  Patient states that she has had mild improvement overnight.  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  Although her ESR was elevated CK was normal and CRP was normal.    Recommendations: - Continue to work with PT both inpatient and outpatient. - If continues to be a problem will need an EMG nerve conduction study as an outpatient    03/23/2018, 9:12 AM

## 2018-03-24 LAB — URINE CULTURE: Culture: >=100000 — AB

## 2018-03-28 LAB — VITAMIN B1: Vitamin B1 (Thiamine): 147.6 nmol/L (ref 66.5–200.0)

## 2018-03-31 ENCOUNTER — Encounter (HOSPITAL_COMMUNITY): Payer: Self-pay | Admitting: Emergency Medicine

## 2018-03-31 ENCOUNTER — Emergency Department (HOSPITAL_COMMUNITY)
Admission: EM | Admit: 2018-03-31 | Discharge: 2018-03-31 | Disposition: A | Discharge status: Home / Self Care | Payer: Medicare HMO | Attending: Emergency Medicine | Admitting: Emergency Medicine

## 2018-03-31 DIAGNOSIS — R413 Other amnesia: Secondary | ICD-10-CM | POA: Insufficient documentation

## 2018-03-31 DIAGNOSIS — Z79899 Other long term (current) drug therapy: Secondary | ICD-10-CM | POA: Diagnosis not present

## 2018-03-31 DIAGNOSIS — I10 Essential (primary) hypertension: Secondary | ICD-10-CM | POA: Insufficient documentation

## 2018-03-31 DIAGNOSIS — R300 Dysuria: Secondary | ICD-10-CM | POA: Diagnosis not present

## 2018-03-31 LAB — URINALYSIS, ROUTINE W REFLEX MICROSCOPIC
Bilirubin Urine: NEGATIVE
Glucose, UA: NEGATIVE mg/dL
Hgb urine dipstick: NEGATIVE
Ketones, ur: NEGATIVE mg/dL
Leukocytes, UA: NEGATIVE
Nitrite: NEGATIVE
Protein, ur: NEGATIVE mg/dL
Specific Gravity, Urine: 1.011 (ref 1.005–1.030)
pH: 5 (ref 5.0–8.0)

## 2018-03-31 NOTE — ED Provider Notes (Signed)
[facility]-EMERGENCY DEPT Provider Note   CSN: [id number] Arrival date & time: 03/31/18  0954     History   Chief Complaint Chief Complaint  Patient presents with  . Memory Loss    HPI [name] is a 52 y.o. female.  HPI Patient reports that she was going to pick up her cousin who is also a client this morning while driving the car and suddenly she could not remember where she was.  She states "I felt like it was in a different city" she regained her memory over time and was able to find her cousin.  He complained of a migraine headache last night which was treated with oxycodone, with relief.  She also complains of left arm pain and right leg pain for several days.  She reports she had a migraine last night for which she took oxycodone.  She also reports that she has repeated dysuria and feels as if she is unable to empty her bladder fully.  Patient was evaluated here as inpatient discharge 03/23/2018 for lower extremity weakness and allodynia was found to be in urinary retention likely medication related.  Urine was sent for culture which was negative.  She had MRI scans of her brain and entire spinal cord all negative. Past Medical History:  Diagnosis Date  . Anxiety   . Asthma   . Depression   . Environmental allergies   . Fibromyalgia   . GERD (gastroesophageal reflux disease)   . Grave's disease   . Headache    migraines  . High cholesterol   . Hypertension   . Sciatica   . Syncope and collapse   . Vasovagal syncope   . Vertigo     Patient Active Problem List   Diagnosis Date Noted  . Leg weakness 03/21/2018  . Intractable chronic migraine without aura and without status migrainosus 04/27/2016  . Intractable chronic migraine without aura and with status migrainosus 01/14/2016  . Insomnia 05/27/2015  . Bipolar 2 disorder (HCC) 02/25/2015  . 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 Severe  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 (HCC) 10/10/2014  . GAD (generalized anxiety disorder) 10/10/2014  . Panic disorder with agoraphobia 10/10/2014  . Social anxiety disorder 10/10/2014  . Other complicated headache syndrome 09/24/2014  . Jaw pain-acute TMJ 09/24/14 09/24/2014  . Protein-calorie malnutrition, severe (HCC) 11/03/2012  . Severe malnutrition (HCC) 11/03/2012  . Lethargy 11/01/2012  . Drug overdose, multiple drugs 11/01/2012  . Hypokalemia 11/01/2012  . Depression 11/22/2011  . Chronic pain 11/22/2011  . Graves' disease   . Asthma   . GERD (gastroesophageal reflux disease)     Past Surgical History:  Procedure Laterality Date  . ABDOMINAL HYSTERECTOMY  2006   partial  . GASTRIC BYPASS  2008     OB History    Gravida  2   Para  2   Term      Preterm      AB      Living  2     SAB      TAB      Ectopic      Multiple      Live Births               Home Medications    Prior to Admission medications   Medication Sig Start Date End Date Taking? Authorizing Provider  albuterol (PROVENTIL HFA;VENTOLIN HFA) 108 (90 BASE) MCG/ACT inhaler Inhale 2  puffs into the lungs every 6 (six) hours as needed for wheezing.    [provider]  amitriptyline (ELAVIL) 50 MG tablet Take 150 mg by mouth at bedtime.  03/01/18   [provider]  ARIPiprazole (ABILIFY) 10 MG tablet Take 1 tablet (10 mg total) by mouth daily. 01/12/18   Oletta Darter, MD  azelastine (ASTELIN) 137 MCG/SPRAY nasal spray Place 1 spray into the nose 2 (two) times daily. Use in each nostril as directed    [provider]  BIOTIN 5000 PO Take 5,000 Units by mouth daily.     [provider]  budesonide-formoterol (SYMBICORT) 80-4.5 MCG/ACT inhaler Inhale 2 puffs into the lungs 2 (two) times daily.    [provider]  cetirizine (ZYRTEC) 10 MG tablet Take 10 mg by mouth daily.    [provider]  cholecalciferol  (VITAMIN D) 1000 UNITS tablet Take 1,000 Units by mouth daily.     [provider]  clonazePAM (KLONOPIN) 0.5 MG tablet Take 1 tablet (0.5 mg total) by mouth 2 (two) times daily as needed for anxiety. 01/12/18   Oletta Darter, MD  cyclobenzaprine (FLEXERIL) 10 MG tablet Take 10 mg by mouth 2 (two) times daily as needed. msucle spasms 02/01/18   [provider]  diclofenac sodium (VOLTAREN) 1 % GEL Apply 2 g topically 4 (four) times daily - after meals and at bedtime. 02/28/18   [provider]  DULoxetine (CYMBALTA) 30 MG capsule Take 3 capsules (90 mg total) by mouth daily. 01/12/18   Oletta Darter, MD  Epinastine HCl (ELESTAT) 0.05 % ophthalmic solution Place 1 drop into both eyes 2 (two) times daily as needed (itching).     [provider]  EPINEPHrine (EPIPEN) 0.3 mg/0.3 mL DEVI Inject 0.3 mg into the muscle as needed. For allergic reaction to stinging insects, mushrooms, and shellfish     [provider]  Erenumab-aooe (AIMOVIG) 140 MG/ML SOAJ Inject 140 mg into the skin every 30 (thirty) days. Patient not taking: Reported on 03/21/2018 11/16/17   Anson Fret, MD  estradiol (ESTRACE) 0.5 MG tablet Take 0.5 mg by mouth daily.    [provider]  fluconazole (DIFLUCAN) 200 MG tablet Take 200 mg by mouth daily. 03/09/18   [provider]  fluticasone (FLONASE) 50 MCG/ACT nasal spray Place 1 spray into both nostrils as needed (allergies/runny nose).     [provider]  gabapentin (NEURONTIN) 300 MG capsule Take 300 mg by mouth 2 (two) times daily.    [provider]  ibuprofen (ADVIL,MOTRIN) 800 MG tablet Take 800 mg by mouth every 8 (eight) hours as needed for moderate pain.    [provider]  meclizine (ANTIVERT) 25 MG tablet Take 1 tablet (25 mg total) by mouth every 6 (six) hours as needed for dizziness. 10/12/13   Purvis Sheffield, MD  Menthol-Methyl Salicylate (BEN GAY GREASELESS) 10-15 % greaseless  cream Apply 1 application topically 3 (three) times daily as needed for pain.    [provider]  mometasone-formoterol (DULERA) 100-5 MCG/ACT AERO Inhale 2 puffs into the lungs 2 (two) times daily as needed for wheezing or shortness of breath.     [provider]  montelukast (SINGULAIR) 10 MG tablet Take 1 tablet (10 mg total) by mouth at bedtime. 02/13/15   Danelle Berry, PA-C  Multiple Vitamins-Minerals (MULTIVITAMIN WITH MINERALS) tablet Take 1 tablet by mouth daily.    [provider]  Oxycodone HCl 10 MG TABS Take 10 mg  by mouth 2 (two) times daily as needed. 03/17/18   [provider]  oxyCODONE-acetaminophen (PERCOCET/ROXICET) 5-325 MG per tablet Take one every 6 hours if needed for cough Patient not taking: Reported on 03/21/2018 06/12/14   Peyton Najjar, MD  oxymetazoline (AFRIN NASAL SPRAY) 0.05 % nasal spray Place 1 spray into both nostrils 2 (two) times daily. Patient taking differently: Place 1 spray into both nostrils 2 (two) times daily as needed for congestion.  07/07/15   Barrett Henle, PA-C  polyethylene glycol [facility] / Ethelene Hal) packet Take 17 g by mouth every other day.     [provider]  ranitidine (ZANTAC) 150 MG tablet Take 150 mg by mouth daily.    [provider]  SUMAtriptan (IMITREX) 100 MG tablet Take 1 tablet (100 mg total) by mouth once as needed for up to 1 dose for migraine. May repeat in 2 hours if headache persists or recurs. Patient not taking: Reported on 03/21/2018 04/12/17   Anson Fret, MD  vitamin B-12 (CYANOCOBALAMIN) 100 MCG tablet Take 100 mcg by mouth daily.    [provider]  zaleplon (SONATA) 10 MG capsule Take one at bedtime. May repeat if necessary Patient not taking: Reported on 03/21/2018 01/12/18   Oletta Darter, MD    Family History Family History  Problem Relation Age of Onset  . Hypertension Mother   . Cirrhosis Mother   . Alcohol abuse Mother   .  Depression Mother   . Physical abuse Mother   . Hypertension Sister   . Alcohol abuse Sister   . Drug abuse Sister   . Depression Sister   . Anxiety disorder Sister   . OCD Sister   . Hypertension Father   . Alcohol abuse Father   . ADD / ADHD Other   . Depression Sister   . Diabetes Other   . Hypertension Other   . Diabetes Maternal Uncle   . Seizures Cousin     Social History Social History   Tobacco Use  . Smoking status: Never Smoker  . Smokeless tobacco: Never Used  Substance Use Topics  . Alcohol use: Yes    Alcohol/week: 1.0 standard drinks    Types: 1 Glasses of wine per week    Comment: one glass of wine every 3 months  . Drug use: No     Allergies   Bee venom; Coconut fatty acids; Mushroom ext cmplx(shiitake-reishi-mait); Nutritional supplements; Other; Shellfish allergy; Strawberry extract; and Hydrocodone-acetaminophen   Review of Systems Review of Systems  Genitourinary: Positive for dysuria.  Musculoskeletal: Positive for myalgias.  Neurological: Positive for headaches.  All other systems reviewed and are negative.    Physical Exam Updated Vital Signs BP 126/76 (BP Location: Left Arm)   Pulse 94   Temp 97.8 F (36.6 C)   Resp 18   SpO2 97%   Physical Exam  Constitutional: She is oriented to person, place, and time. She appears well-developed and well-nourished.  HENT:  Head: Normocephalic and atraumatic.  Eyes: Pupils are equal, round, and reactive to light. Conjunctivae are normal.  Neck: Neck supple. No tracheal deviation present. No thyromegaly present.  Cardiovascular: Normal rate and regular rhythm.  No murmur heard. Pulmonary/Chest: Effort normal and breath sounds normal.  Abdominal: Soft. Bowel sounds are normal. She exhibits no distension. There is no tenderness.  Musculoskeletal: Normal range of motion. She exhibits no edema or tenderness.  Neurological: She is alert and oriented to person, place, and time. No cranial nerve  deficit. Coordination normal.  Normal Romberg normal pronator drift normal DTRs 2+ knee jerk ankle jerk biceps was downgoing bilaterally  Skin: Skin is warm and dry. No rash noted.  Psychiatric: She has a normal mood and affect.  Nursing note and vitals reviewed.    ED Treatments / Results  Labs (all labs ordered are listed, but only abnormal results are displayed) Labs Reviewed  URINALYSIS, ROUTINE W REFLEX MICROSCOPIC    EKG None  Radiology No results found.  Procedures Procedures (including critical care time)  Medications Ordered in ED Medications - No data to display Bladder scan showed no urine retained in bladder Results for orders placed or performed during the hospital encounter of 03/31/18  Urinalysis, Routine w reflex microscopic  Result Value Ref Range   Color, Urine YELLOW YELLOW   APPearance CLEAR CLEAR   Specific Gravity, Urine 1.011 1.005 - 1.030   pH 5.0 5.0 - 8.0   Glucose, UA NEGATIVE NEGATIVE mg/dL   Hgb urine dipstick NEGATIVE NEGATIVE   Bilirubin Urine NEGATIVE NEGATIVE   Ketones, ur NEGATIVE NEGATIVE mg/dL   Protein, ur NEGATIVE NEGATIVE mg/dL   Nitrite NEGATIVE NEGATIVE   Leukocytes, UA NEGATIVE NEGATIVE   Mr Laqueta Jean And Wo Contrast  Result Date: 03/21/2018 CLINICAL DATA:  Lower extremity weakness. EXAM: MRI HEAD WITHOUT AND WITH CONTRAST TECHNIQUE: Multiplanar, multiecho pulse sequences of the brain and surrounding structures were obtained without and with intravenous contrast. CONTRAST:  10 mL Gadavist COMPARISON:  Brain MRI 11/09/2017 FINDINGS: BRAIN: There is no acute infarct, acute hemorrhage, hydrocephalus or extra-axial collection. The midline structures are normal. No midline shift or other mass effect. There are no old infarcts. There are a few clustered foci of hyperintense T2-weighted signal in the left frontal subcortical white matter and right periatrial white matter, but this is a nonspecific finding and commonly seen in normal  patients in this age range. On the prior scan, the apparent greater number of lesions was probably due partially to pulsation artifacts. The cerebral and cerebellar volume are age-appropriate. Susceptibility-sensitive sequences show no chronic microhemorrhage or superficial siderosis. VASCULAR: Major intracranial arterial and venous sinus flow voids are normal. SKULL AND UPPER CERVICAL SPINE: Calvarial bone marrow signal is normal. There is no skull base mass. Visualized upper cervical spine and soft tissues are normal. SINUSES/ORBITS: No fluid levels or advanced mucosal thickening. No mastoid or middle ear effusion. The orbits are normal. IMPRESSION: Normal aging brain. Electronically Signed   By: Deatra Robinson M.D.   On: 03/21/2018 13:47   Mr Cervical Spine W Or Wo Contrast  Result Date: 03/21/2018 EXAM: MRI TOTAL SPINE WITHOUT AND WITH CONTRAST TECHNIQUE: Multisequence MR imaging of the spine from the cervical spine to the sacrum was performed prior to and following IV contrast administration. CONTRAST:  10 mL Gadavist COMPARISON:  None. FINDINGS: MRI CERVICAL SPINE FINDINGS Alignment: Physiologic. Vertebrae: No fracture, evidence of discitis, or bone lesion. Cord: Normal signal and morphology. Posterior Fossa, vertebral arteries, paraspinal tissues: Visualized posterior fossa is normal. Vertebral artery flow voids are preserved. No prevertebral soft tissue swelling. Disc levels: The C2-C5 levels are normal. C5-6: Bilateral uncovertebral hypertrophy with moderate neural foraminal stenosis. C6-7: Mild uncovertebral hypertrophy with mild bilateral foraminal stenosis. C7-T1 is normal. MRI THORACIC SPINE FINDINGS Alignment:  Physiologic. Vertebrae: No fracture, evidence of discitis, or bone lesion. Cord:  Normal signal and morphology. Paraspinal and other soft tissues: Negative. Disc levels: No disc herniation or stenosis. MRI LUMBAR SPINE FINDINGS Segmentation:  Standard. Alignment:  Physiologic.  Vertebrae:  No  fracture, evidence of discitis, or bone lesion. Conus medullaris: Extends to the L1 level and appears normal. Paraspinal and other soft tissues: Negative. Disc levels: L4-5: Moderate facet hypertrophy. No spinal canal or neural foraminal stenosis. L5-S1: Left eccentric disc bulge with mild left neural foraminal narrowing. There is no abnormal contrast enhancement. IMPRESSION: 1. Normal appearance of the spinal cord. No evidence of demyelinating disease within the spine. 2. Mild cervical and lumbar degenerative disc disease with mild-to-moderate neural foraminal stenosis at C5-6, C6-7 and L5-S1. Electronically Signed   By: Deatra Robinson M.D.   On: 03/21/2018 13:27   Mr Thoracic Spine W Wo Contrast  Result Date: 03/21/2018 EXAM: MRI TOTAL SPINE WITHOUT AND WITH CONTRAST TECHNIQUE: Multisequence MR imaging of the spine from the cervical spine to the sacrum was performed prior to and following IV contrast administration. CONTRAST:  10 mL Gadavist COMPARISON:  None. FINDINGS: MRI CERVICAL SPINE FINDINGS Alignment: Physiologic. Vertebrae: No fracture, evidence of discitis, or bone lesion. Cord: Normal signal and morphology. Posterior Fossa, vertebral arteries, paraspinal tissues: Visualized posterior fossa is normal. Vertebral artery flow voids are preserved. No prevertebral soft tissue swelling. Disc levels: The C2-C5 levels are normal. C5-6: Bilateral uncovertebral hypertrophy with moderate neural foraminal stenosis. C6-7: Mild uncovertebral hypertrophy with mild bilateral foraminal stenosis. C7-T1 is normal. MRI THORACIC SPINE FINDINGS Alignment:  Physiologic. Vertebrae: No fracture, evidence of discitis, or bone lesion. Cord:  Normal signal and morphology. Paraspinal and other soft tissues: Negative. Disc levels: No disc herniation or stenosis. MRI LUMBAR SPINE FINDINGS Segmentation:  Standard. Alignment:  Physiologic. Vertebrae:  No fracture, evidence of discitis, or bone lesion. Conus medullaris: Extends to the  L1 level and appears normal. Paraspinal and other soft tissues: Negative. Disc levels: L4-5: Moderate facet hypertrophy. No spinal canal or neural foraminal stenosis. L5-S1: Left eccentric disc bulge with mild left neural foraminal narrowing. There is no abnormal contrast enhancement. IMPRESSION: 1. Normal appearance of the spinal cord. No evidence of demyelinating disease within the spine. 2. Mild cervical and lumbar degenerative disc disease with mild-to-moderate neural foraminal stenosis at C5-6, C6-7 and L5-S1. Electronically Signed   By: Deatra Robinson M.D.   On: 03/21/2018 13:27   Mr Lumbar Spine W Wo Contrast  Result Date: 03/21/2018 EXAM: MRI TOTAL SPINE WITHOUT AND WITH CONTRAST TECHNIQUE: Multisequence MR imaging of the spine from the cervical spine to the sacrum was performed prior to and following IV contrast administration. CONTRAST:  10 mL Gadavist COMPARISON:  None. FINDINGS: MRI CERVICAL SPINE FINDINGS Alignment: Physiologic. Vertebrae: No fracture, evidence of discitis, or bone lesion. Cord: Normal signal and morphology. Posterior Fossa, vertebral arteries, paraspinal tissues: Visualized posterior fossa is normal. Vertebral artery flow voids are preserved. No prevertebral soft tissue swelling. Disc levels: The C2-C5 levels are normal. C5-6: Bilateral uncovertebral hypertrophy with moderate neural foraminal stenosis. C6-7: Mild uncovertebral hypertrophy with mild bilateral foraminal stenosis. C7-T1 is normal. MRI THORACIC SPINE FINDINGS Alignment:  Physiologic. Vertebrae: No fracture, evidence of discitis, or bone lesion. Cord:  Normal signal and morphology. Paraspinal and other soft tissues: Negative. Disc levels: No disc herniation or stenosis. MRI LUMBAR SPINE FINDINGS Segmentation:  Standard. Alignment:  Physiologic. Vertebrae:  No fracture, evidence of discitis, or bone lesion. Conus medullaris: Extends to the L1 level and appears normal. Paraspinal and other soft tissues: Negative. Disc  levels: L4-5: Moderate facet hypertrophy. No spinal canal or neural foraminal stenosis. L5-S1: Left eccentric disc bulge with mild left neural foraminal narrowing. There is no abnormal contrast  enhancement. IMPRESSION: 1. Normal appearance of the spinal cord. No evidence of demyelinating disease within the spine. 2. Mild cervical and lumbar degenerative disc disease with mild-to-moderate neural foraminal stenosis at C5-6, C6-7 and L5-S1. Electronically Signed   By: Deatra Robinson M.D.   On: 03/21/2018 13:27   Initial Impression / Assessment and Plan / ED Course  I have reviewed the triage vital signs and the nursing notes.  Pertinent labs & imaging results that were available during my care of the patient were reviewed by me and considered in my medical decision making (see chart for details).     Patient had extensive work-up less than 2 weeks ago.  She does not require further work-up.  Her exam is normal.  I will refer her to neurology as outpatient.  She can also be referred back to her primary care physician  Final Clinical Impressions(s) / ED Diagnoses   Final diagnoses:  None   Diagnosis #1 memory loss #2 myalgias #3 migraine headache ED Discharge Orders    None       Doug Sou, MD 03/31/18 1056

## 2018-03-31 NOTE — ED Triage Notes (Signed)
Patient here from home with complaints of memory loss. States that she was driving her car this morning and forgot where she was going. Also reports have a migraine the last two days, but not today. Denies n/v.

## 2018-03-31 NOTE — Discharge Instructions (Addendum)
Call [facility] or [place] [facility] office to schedule an office appointment.  Or you can call your primary care provider to schedule the next available appointment

## 2018-03-31 NOTE — ED Notes (Signed)
Pt left prior to DC instructions being reviewed. Pt left the room with out notifying staff she was leaving. Unable to obtain vitals or signature. Pt ambulated out of ED

## 2018-03-31 NOTE — ED Notes (Signed)
Bladder Scan: 0ML ?

## 2018-04-05 ENCOUNTER — Telehealth (HOSPITAL_COMMUNITY): Payer: Self-pay

## 2018-04-05 NOTE — Telephone Encounter (Signed)
Patient is calling because she is not scheduled until Feb. And she needs a refill on all of her medications. Please review and advise, thank you

## 2018-04-06 NOTE — Telephone Encounter (Signed)
We can refill the Abilify and Cymbalta. I will only give her one month supply of Klonopin with no refills. Pt will need to make the Klonopin last until her next scheduled visit in Feb.

## 2018-04-07 ENCOUNTER — Other Ambulatory Visit (HOSPITAL_COMMUNITY): Payer: Self-pay

## 2018-04-07 DIAGNOSIS — F4010 Social phobia, unspecified: Secondary | ICD-10-CM

## 2018-04-07 DIAGNOSIS — F333 Major depressive disorder, recurrent, severe with psychotic symptoms: Secondary | ICD-10-CM

## 2018-04-07 DIAGNOSIS — F411 Generalized anxiety disorder: Secondary | ICD-10-CM

## 2018-04-07 DIAGNOSIS — F4001 Agoraphobia with panic disorder: Secondary | ICD-10-CM

## 2018-04-07 MED ORDER — DULOXETINE HCL 30 MG PO CPEP: 90.0000 mg | ORAL_CAPSULE | Freq: Every day | ORAL | 0 refills | Status: DC

## 2018-04-07 MED ORDER — ARIPIPRAZOLE 10 MG PO TABS: 10.0000 mg | ORAL_TABLET | Freq: Every day | ORAL | 0 refills | Status: DC

## 2018-04-07 NOTE — Telephone Encounter (Signed)
Sent refill of Abilify and Cymbalta to pharmacy

## 2018-04-17 ENCOUNTER — Telehealth: Payer: Self-pay | Admitting: *Deleted

## 2018-04-17 ENCOUNTER — Ambulatory Visit: Payer: Medicaid Other | Admitting: Neurology

## 2018-04-17 NOTE — Telephone Encounter (Signed)
Pt no showed appt on 04/17/2018 @ 08:30.

## 2018-04-25 ENCOUNTER — Encounter: Payer: Self-pay | Admitting: Neurology

## 2018-05-05 ENCOUNTER — Emergency Department (HOSPITAL_BASED_OUTPATIENT_CLINIC_OR_DEPARTMENT_OTHER): Payer: Medicare HMO

## 2018-05-05 ENCOUNTER — Emergency Department (HOSPITAL_COMMUNITY)
Admission: EM | Admit: 2018-05-05 | Discharge: 2018-05-05 | Disposition: A | Discharge status: Home / Self Care | Payer: Medicare HMO | Attending: Emergency Medicine | Admitting: Emergency Medicine

## 2018-05-05 ENCOUNTER — Encounter (HOSPITAL_COMMUNITY): Payer: Self-pay

## 2018-05-05 ENCOUNTER — Other Ambulatory Visit: Payer: Self-pay

## 2018-05-05 DIAGNOSIS — M79605 Pain in left leg: Secondary | ICD-10-CM | POA: Diagnosis not present

## 2018-05-05 DIAGNOSIS — I10 Essential (primary) hypertension: Secondary | ICD-10-CM | POA: Diagnosis not present

## 2018-05-05 DIAGNOSIS — G894 Chronic pain syndrome: Secondary | ICD-10-CM | POA: Insufficient documentation

## 2018-05-05 DIAGNOSIS — M79604 Pain in right leg: Secondary | ICD-10-CM | POA: Diagnosis present

## 2018-05-05 DIAGNOSIS — R52 Pain, unspecified: Secondary | ICD-10-CM | POA: Diagnosis not present

## 2018-05-05 DIAGNOSIS — F319 Bipolar disorder, unspecified: Secondary | ICD-10-CM | POA: Insufficient documentation

## 2018-05-05 DIAGNOSIS — M797 Fibromyalgia: Secondary | ICD-10-CM | POA: Diagnosis not present

## 2018-05-05 DIAGNOSIS — Z79899 Other long term (current) drug therapy: Secondary | ICD-10-CM | POA: Insufficient documentation

## 2018-05-05 MED ORDER — OXYCODONE-ACETAMINOPHEN 5-325 MG PO TABS
1.0000 | ORAL_TABLET | Freq: Once | ORAL | Status: AC
  Administered 2018-05-05: 1 via ORAL
  Filled 2018-05-05: qty 1

## 2018-05-05 MED ORDER — DEXAMETHASONE 4 MG PO TABS
10.0000 mg | ORAL_TABLET | Freq: Once | ORAL | Status: AC
  Administered 2018-05-05: 10 mg via ORAL
  Filled 2018-05-05: qty 3

## 2018-05-05 NOTE — ED Provider Notes (Addendum)
[facility] EMERGENCY DEPARTMENT Provider Note   CSN: [id number] Arrival date & time: 05/05/18  1331     History   Chief Complaint No chief complaint on file.   HPI [name] is a 52 y.o. female with a history of chronic pain syndrome, fibromyalgia, sciatica, vertigo, hypertension, hyperlipidemia, prior drug overdose, bipolar 2 disorder who presents emergency department today for leg burning sensation.  Patient reports over the last 1 week she has been having a burning sensation in her left anterior quadricep whenever she walks.  She reports this pain is severe in nature, worsened with ambulation and improved with rest as well as her gabapentin, ibuprofen and OxyContin.  She reports that she is since running low on her OxyContin and has been trying Biofreeze for her symptoms without any relief.  She notes that her symptoms are only fully resolved with OxyContin.  Patient reports that this feels different than her prior sciatica.  She denies any back pain.  No recent falls or trauma to the area.  She denies any lower extremity swelling.  No skin changes.  She denies any fevers.  Patient also reports that over the last several days she has been having pain in her right lower extremity.  She reports is a tearing/sharp pain over her anterior shin as well as her posterior calf that occurred while she was driving.  She notes the pain is not reoccurred.  This last happened yesterday.  She thinks this may be related to a strain she had in her calf several months ago and was using a cam walker for this.  She is wondering if her symptoms are related to her rheumatoid arthritis or her fibromyalgia.  Patient denies any fever, chills, chest pain, shortness of breath, cough, hemoptysis, back pain, lower extremity swelling, numbness/tingling/weakness of the lower extremities, bowel/bladder incontinence, urinary retention, urinary symptoms, abdominal pain.   HPI  Past Medical History:    Diagnosis Date  . Anxiety   . Asthma   . Depression   . Environmental allergies   . Fibromyalgia   . GERD (gastroesophageal reflux disease)   . Grave's disease   . Headache    migraines  . High cholesterol   . Hypertension   . Sciatica   . Syncope and collapse   . Vasovagal syncope   . Vertigo     Patient Active Problem List   Diagnosis Date Noted  . Leg weakness 03/21/2018  . Intractable chronic migraine without aura and without status migrainosus 04/27/2016  . Intractable chronic migraine without aura and with status migrainosus 01/14/2016  . Insomnia 05/27/2015  . Bipolar 2 disorder (HCC) 02/25/2015  . 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 Severe 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 (HCC) 10/10/2014  . GAD (generalized anxiety disorder) 10/10/2014  . Panic disorder with agoraphobia 10/10/2014  . Social anxiety disorder 10/10/2014  . Other complicated headache syndrome 09/24/2014  . Jaw pain-acute TMJ 09/24/14 09/24/2014  . Protein-calorie malnutrition, severe (HCC) 11/03/2012  . Severe malnutrition (HCC) 11/03/2012  . Lethargy 11/01/2012  . Drug overdose, multiple drugs 11/01/2012  . Hypokalemia 11/01/2012  . Depression 11/22/2011  . Chronic pain 11/22/2011  . Graves' disease   . Asthma   . GERD (gastroesophageal reflux disease)     Past Surgical History:  Procedure Laterality Date  . ABDOMINAL HYSTERECTOMY  2006   partial  . GASTRIC BYPASS  2008     OB History  Gravida  2   Para  2   Term      Preterm      AB      Living  2     SAB      TAB      Ectopic      Multiple      Live Births               Home Medications    Prior to Admission medications   Medication Sig Start Date End Date Taking? Authorizing Provider  albuterol (PROVENTIL HFA;VENTOLIN HFA) 108 (90 BASE) MCG/ACT inhaler Inhale 2 puffs into the lungs every 6 (six) hours as needed for wheezing.     [provider]  amitriptyline (ELAVIL) 50 MG tablet Take 150 mg by mouth at bedtime.  03/01/18   [provider]  ARIPiprazole (ABILIFY) 10 MG tablet Take 1 tablet (10 mg total) by mouth daily. 04/07/18   Oletta Darter, MD  azelastine (ASTELIN) 137 MCG/SPRAY nasal spray Place 1 spray into the nose 2 (two) times daily. Use in each nostril as directed    [provider]  BIOTIN 5000 PO Take 5,000 Units by mouth daily.     [provider]  budesonide-formoterol (SYMBICORT) 80-4.5 MCG/ACT inhaler Inhale 2 puffs into the lungs 2 (two) times daily.    [provider]  cetirizine (ZYRTEC) 10 MG tablet Take 10 mg by mouth daily.    [provider]  cholecalciferol (VITAMIN D) 1000 UNITS tablet Take 1,000 Units by mouth daily.     [provider]  clonazePAM (KLONOPIN) 0.5 MG tablet TAKE 1 TABLET BY MOUTH TWICE DAILY AS NEEDED FOR ANXIETY 04/06/18   Oletta Darter, MD  cyclobenzaprine (FLEXERIL) 10 MG tablet Take 10 mg by mouth 2 (two) times daily as needed. msucle spasms 02/01/18   [provider]  diclofenac sodium (VOLTAREN) 1 % GEL Apply 2 g topically 4 (four) times daily - after meals and at bedtime. 02/28/18   [provider]  DULoxetine (CYMBALTA) 30 MG capsule Take 3 capsules (90 mg total) by mouth daily. 04/07/18   Oletta Darter, MD  Epinastine HCl (ELESTAT) 0.05 % ophthalmic solution Place 1 drop into both eyes 2 (two) times daily as needed (itching).     [provider]  EPINEPHrine (EPIPEN) 0.3 mg/0.3 mL DEVI Inject 0.3 mg into the muscle as needed. For allergic reaction to stinging insects, mushrooms, and shellfish     [provider]  Erenumab-aooe (AIMOVIG) 140 MG/ML SOAJ Inject 140 mg into the skin every 30 (thirty) days. Patient not taking: Reported on 03/21/2018 11/16/17   Anson Fret, MD  estradiol (ESTRACE) 0.5 MG tablet Take 0.5 mg by mouth daily.    [provider]    fluconazole (DIFLUCAN) 200 MG tablet Take 200 mg by mouth daily. 03/09/18   [provider]  fluticasone (FLONASE) 50 MCG/ACT nasal spray Place 1 spray into both nostrils as needed (allergies/runny nose).     [provider]  gabapentin (NEURONTIN) 300 MG capsule Take 300 mg by mouth 2 (two) times daily.    [provider]  ibuprofen (ADVIL,MOTRIN) 800 MG tablet Take 800 mg by mouth every 8 (eight) hours as needed for moderate pain.    [provider]  meclizine (ANTIVERT) 25 MG tablet Take 1 tablet (25 mg total) by mouth every 6 (six) hours as needed for dizziness. 10/12/13   Purvis Sheffield, MD  Menthol-Methyl Salicylate (BEN Vallarie Mare)  10-15 % greaseless cream Apply 1 application topically 3 (three) times daily as needed for pain.    [provider]  mometasone-formoterol (DULERA) 100-5 MCG/ACT AERO Inhale 2 puffs into the lungs 2 (two) times daily as needed for wheezing or shortness of breath.     [provider]  montelukast (SINGULAIR) 10 MG tablet Take 1 tablet (10 mg total) by mouth at bedtime. 02/13/15   Danelle Berry, PA-C  Multiple Vitamins-Minerals (MULTIVITAMIN WITH MINERALS) tablet Take 1 tablet by mouth daily.    [provider]  Oxycodone HCl 10 MG TABS Take 10 mg by mouth 2 (two) times daily as needed. 03/17/18   [provider]  oxyCODONE-acetaminophen (PERCOCET/ROXICET) 5-325 MG per tablet Take one every 6 hours if needed for cough Patient not taking: Reported on 03/21/2018 06/12/14   Peyton Najjar, MD  oxymetazoline (AFRIN NASAL SPRAY) 0.05 % nasal spray Place 1 spray into both nostrils 2 (two) times daily. Patient taking differently: Place 1 spray into both nostrils 2 (two) times daily as needed for congestion.  07/07/15   Barrett Henle, PA-C  polyethylene glycol [facility] Ethelene [facility]) packet Take 17 g by mouth every other day.     [provider]  ranitidine (ZANTAC) 150 MG tablet Take  150 mg by mouth daily.    [provider]  SUMAtriptan (IMITREX) 100 MG tablet Take 1 tablet (100 mg total) by mouth once as needed for up to 1 dose for migraine. May repeat in 2 hours if headache persists or recurs. Patient not taking: Reported on 03/21/2018 04/12/17   Anson Fret, MD  vitamin B-12 (CYANOCOBALAMIN) 100 MCG tablet Take 100 mcg by mouth daily.    [provider]  zaleplon (SONATA) 10 MG capsule Take one at bedtime. May repeat if necessary Patient not taking: Reported on 03/21/2018 01/12/18   Oletta Darter, MD    Family History Family History  Problem Relation Age of Onset  . Hypertension Mother   . Cirrhosis Mother   . Alcohol abuse Mother   . Depression Mother   . Physical abuse Mother   . Hypertension Sister   . Alcohol abuse Sister   . Drug abuse Sister   . Depression Sister   . Anxiety disorder Sister   . OCD Sister   . Hypertension Father   . Alcohol abuse Father   . ADD / ADHD Other   . Depression Sister   . Diabetes Other   . Hypertension Other   . Diabetes Maternal Uncle   . Seizures Cousin     Social History Social History   Tobacco Use  . Smoking status: Never Smoker  . Smokeless tobacco: Never Used  Substance Use Topics  . Alcohol use: Yes    Alcohol/week: 1.0 standard drinks    Types: 1 Glasses of wine per week    Comment: one glass of wine every 3 months  . Drug use: No     Allergies   Bee venom; Coconut fatty acids; Mushroom ext cmplx(shiitake-reishi-mait); Nutritional supplements; Other; Shellfish allergy; Strawberry extract; and Hydrocodone-acetaminophen   Review of Systems Review of Systems  All other systems reviewed and are negative.    Physical Exam Updated Vital Signs BP 110/78 (BP Location: Right Arm)   Pulse 81   Resp 16   SpO2 98%   Physical Exam Vitals signs and nursing note reviewed.  Constitutional:      Appearance: She is well-developed. She is not diaphoretic.  HENT:  Head:  Normocephalic and atraumatic.     Right Ear: External ear normal.     Left Ear: External ear normal.     Nose: Nose normal.     Mouth/Throat:     Pharynx: Uvula midline.     Tonsils: No tonsillar exudate.  Eyes:     General: No scleral icterus.       Right eye: No discharge.        Left eye: No discharge.     Pupils: Pupils are equal, round, and reactive to light.  Neck:     Musculoskeletal: Neck supple. Normal range of motion. No neck rigidity or spinous process tenderness.     Trachea: Trachea normal.  Cardiovascular:     Rate and Rhythm: Normal rate and regular rhythm.     Pulses:          Radial pulses are 2+ on the right side and 2+ on the left side.       Dorsalis pedis pulses are 2+ on the right side and 2+ on the left side.       Posterior tibial pulses are 2+ on the right side and 2+ on the left side.     Heart sounds: No murmur.     Comments: No lower extremity swelling or edema. Calves symmetric in size bilaterally. Pulmonary:     Effort: Pulmonary effort is normal.     Breath sounds: Normal breath sounds.  Chest:     Chest wall: No tenderness.  Abdominal:     General: Bowel sounds are normal.     Palpations: Abdomen is soft.     Tenderness: There is no abdominal tenderness. There is no guarding or rebound.  Musculoskeletal:     Right hip: Normal.     Left hip: Normal.     Right knee: Normal.     Left knee: Normal.     Right ankle: Normal. Achilles tendon normal. Achilles tendon exhibits no pain, no defect and normal Thompson's test results.     Left ankle: Normal.     Lumbar back: Normal.     Left upper leg: She exhibits tenderness. She exhibits no bony tenderness, no swelling, no edema, no deformity and no laceration.     Right lower leg: She exhibits tenderness. She exhibits no bony tenderness, no swelling and no deformity. No edema.     Left lower leg: She exhibits no tenderness, no bony tenderness, no swelling, no deformity and no laceration. No edema.        Legs:  Lymphadenopathy:     Cervical: No cervical adenopathy.  Skin:    General: Skin is warm and dry.     Findings: No rash.  Neurological:     Mental Status: She is alert.      ED Treatments / Results  Labs (all labs ordered are listed, but only abnormal results are displayed) Labs Reviewed - No data to display  EKG None  Radiology No results found.  Procedures Procedures (including critical care time)  Medications Ordered in ED Medications  oxyCODONE-acetaminophen (PERCOCET/ROXICET) 5-325 MG per tablet 1 tablet (has no administration in time range)  dexamethasone (DECADRON) tablet 10 mg (has no administration in time range)     Initial Impression / Assessment and Plan / ED Course  I have reviewed the triage vital signs and the nursing notes.  Pertinent labs & imaging results that were available during my care of the patient were reviewed by me and considered in my  medical decision making (see chart for details).     52 y.o. female with a history of chronic pain syndrome, fibromyalgia, sciatica, vertigo, hypertension, hyperlipidemia, prior drug overdose, bipolar 2 disorder who presents emergency department today for leg burning sensation and right lower leg pain.   Patient without any lumbar ttp. She denies back pain. No evidence of radiculopathy on exam. Compartments are soft. She is NVI b/l.  No vesicular-like rash visualized.  Patient has normal range of motion of all joints. She denies trauma. No indication of xray. She is neurologically intact.  Patient can walk without difficulty.  Will obtain ultrasound to rule out DVT.  If negative, will have patient continue home pain medication and follow-up with PCP for further evaluation.  Ultrasound negative for DVT. The evaluation does not show pathology that would require ongoing emergent intervention or inpatient treatment. I advised the patient to follow-up with pcp this week. I advised the patient to return to the  emergency department with new or worsening symptoms or new concerns. Specific return precautions discussed. The patient verbalized understanding and agreement with plan. All questions answered. No further questions at this time. The patient is hemodynamically stable, mentating appropriately and appears safe for discharge.  Final Clinical Impressions(s) / ED Diagnoses   Final diagnoses:  Right leg pain  Left leg pain    ED Discharge Orders    None       Jacinto Halim, PA-C 05/05/18 1522    Jacinto Halim, PA-C 05/05/18 1524    Terrilee Files, MD 05/11/18 [phone]

## 2018-05-05 NOTE — Progress Notes (Signed)
Bilateral lower extremity venous duplex has been completed. Negative for obvious evidence of DVT. Results were given to Leary Roca PA.  05/05/18 3:18 PM Olen Cordial RVT

## 2018-05-05 NOTE — ED Triage Notes (Signed)
Pt presents for eval of bilateral leg pain and burning. Pt states recently diagnosed with fibromyalgia, states her doctor has not started her on any medication. Pt ambulatory with steady gait.

## 2018-05-05 NOTE — ED Notes (Signed)
Attempted to discharge patient, pt upset that we "did not treat" her. Pt states we are not providing further imaging for her legs due to her insurance. This RN explained to pt that her insurance status does not affect treatment in the ED. Pt is refusing registration because "I'm just very upset with the care I have received here, nobody is doing any xrays so now I have to go to the doctor and get another referral".Pt requesting to speak with physician, EDP aware and at bedside.

## 2018-05-05 NOTE — ED Notes (Signed)
Patient verbalizes understanding of discharge instructions. Opportunity for questioning and answers were provided. Armband removed by staff, pt discharged from ED.  

## 2018-05-05 NOTE — Discharge Instructions (Signed)
Your ultrasound and workup were reassuring today. Please follow up with your primary care provider in the next 1 week. If you develop worsening or new concerning symptoms you can return to the emergency department for re-evaluation.

## 2018-05-09 ENCOUNTER — Other Ambulatory Visit (HOSPITAL_COMMUNITY): Payer: Self-pay | Admitting: Psychiatry

## 2018-05-09 DIAGNOSIS — F333 Major depressive disorder, recurrent, severe with psychotic symptoms: Secondary | ICD-10-CM

## 2018-05-11 NOTE — ED Provider Notes (Signed)
Clinical Course as of May 11 814  Fri May 05, 2018  [id number] 53 year old female who is a history newly of fibromyalgia care with a left thigh burning pain and also right anterior shin pain that is been going on for at least a few weeks.  No fever no recent illness.  She has a fairly benign physical exam other than some tenderness in these areas that are bothering her.  She had a duplex here that was negative for clot.  She was given some steroids and recommended that she follow-up with her primary care doctor and orthopedist for further work-up as she may need further imaging or physical therapy.   [MB]    Clinical Course User Index [MB] Terrilee Files, MD     Terrilee Files, MD 05/11/18 [phone]

## 2018-05-29 ENCOUNTER — Other Ambulatory Visit (HOSPITAL_COMMUNITY): Payer: Self-pay

## 2018-05-29 ENCOUNTER — Other Ambulatory Visit (HOSPITAL_COMMUNITY): Payer: Self-pay | Admitting: Psychiatry

## 2018-05-29 DIAGNOSIS — F4001 Agoraphobia with panic disorder: Secondary | ICD-10-CM

## 2018-05-29 DIAGNOSIS — F4010 Social phobia, unspecified: Secondary | ICD-10-CM

## 2018-05-29 DIAGNOSIS — F411 Generalized anxiety disorder: Secondary | ICD-10-CM

## 2018-05-29 DIAGNOSIS — F333 Major depressive disorder, recurrent, severe with psychotic symptoms: Secondary | ICD-10-CM

## 2018-05-29 MED ORDER — CLONAZEPAM 0.5 MG PO TABS: 0.5000 mg | ORAL_TABLET | Freq: Two times a day (BID) | ORAL | 0 refills | Status: DC | PRN

## 2018-05-29 MED ORDER — ARIPIPRAZOLE 10 MG PO TABS: 10.0000 mg | ORAL_TABLET | Freq: Every day | ORAL | 0 refills | Status: DC

## 2018-05-29 MED ORDER — DULOXETINE HCL 30 MG PO CPEP: 90.0000 mg | ORAL_CAPSULE | Freq: Every day | ORAL | 0 refills | Status: DC

## 2018-06-06 ENCOUNTER — Ambulatory Visit (HOSPITAL_COMMUNITY): Payer: Medicare HMO | Admitting: Licensed Clinical Social Worker

## 2018-06-08 ENCOUNTER — Ambulatory Visit (HOSPITAL_COMMUNITY): Payer: Medicare HMO | Admitting: Psychiatry

## 2018-06-13 ENCOUNTER — Ambulatory Visit (HOSPITAL_COMMUNITY): Payer: Medicare HMO | Admitting: Licensed Clinical Social Worker

## 2018-06-28 ENCOUNTER — Ambulatory Visit (HOSPITAL_COMMUNITY): Payer: Medicare HMO | Admitting: Licensed Clinical Social Worker

## 2018-06-29 ENCOUNTER — Ambulatory Visit (INDEPENDENT_AMBULATORY_CARE_PROVIDER_SITE_OTHER): Payer: Medicare HMO | Admitting: Psychiatry

## 2018-06-29 ENCOUNTER — Encounter (HOSPITAL_COMMUNITY): Payer: Self-pay | Admitting: Psychiatry

## 2018-06-29 VITALS — BP 115/79 | HR 94 | Ht 66.0 in | Wt 256.0 lb

## 2018-06-29 DIAGNOSIS — F4001 Agoraphobia with panic disorder: Secondary | ICD-10-CM

## 2018-06-29 DIAGNOSIS — F4010 Social phobia, unspecified: Secondary | ICD-10-CM | POA: Diagnosis not present

## 2018-06-29 DIAGNOSIS — F99 Mental disorder, not otherwise specified: Secondary | ICD-10-CM

## 2018-06-29 DIAGNOSIS — F333 Major depressive disorder, recurrent, severe with psychotic symptoms: Secondary | ICD-10-CM | POA: Diagnosis not present

## 2018-06-29 DIAGNOSIS — F5105 Insomnia due to other mental disorder: Secondary | ICD-10-CM

## 2018-06-29 DIAGNOSIS — F411 Generalized anxiety disorder: Secondary | ICD-10-CM | POA: Diagnosis not present

## 2018-06-29 MED ORDER — LAMOTRIGINE 25 MG PO TABS: ORAL_TABLET | ORAL | 0 refills | Status: DC

## 2018-06-29 MED ORDER — ZALEPLON 10 MG PO CAPS: ORAL_CAPSULE | ORAL | 0 refills | Status: DC

## 2018-06-29 MED ORDER — DULOXETINE HCL 30 MG PO CPEP: 90.0000 mg | ORAL_CAPSULE | Freq: Every day | ORAL | 0 refills | Status: DC

## 2018-06-29 MED ORDER — CLONAZEPAM 0.5 MG PO TABS: 0.5000 mg | ORAL_TABLET | Freq: Two times a day (BID) | ORAL | 0 refills | Status: DC | PRN
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
## 2018-07-05 ENCOUNTER — Ambulatory Visit (INDEPENDENT_AMBULATORY_CARE_PROVIDER_SITE_OTHER): Payer: Medicare HMO | Admitting: Psychiatry

## 2018-07-05 ENCOUNTER — Encounter (HOSPITAL_COMMUNITY): Payer: Self-pay | Admitting: Psychiatry

## 2018-07-05 DIAGNOSIS — F4001 Agoraphobia with panic disorder: Secondary | ICD-10-CM | POA: Diagnosis not present

## 2018-07-05 DIAGNOSIS — F3181 Bipolar II disorder: Secondary | ICD-10-CM | POA: Diagnosis not present

## 2018-07-05 DIAGNOSIS — F411 Generalized anxiety disorder: Secondary | ICD-10-CM

## 2018-07-05 NOTE — Progress Notes (Deleted)
Bed most days, panic attacks 2xs a week, kids have to make her eat, Dr. Scotty Court, its like I don't have feelings, I don't care attitude,   Daughter 91- just moved in November because they are concerned with her mental health condition, 55 yo granddaughter, parenting by daughter,  Son 23 Sister lives 3 blocks made her call to come back here,  Previously 9 years of therapy with Fish farm manager in [place], had her write some things, didn't force  StepDad passed 1.5  Mom passed in 2010  Worse in August, in [place] always shower 2x a week This past year worst for her Disability started 1.5 ago, syatica, ruatoid auth, fibromyalgia, MS, Bipolar,  2x a month going to church Close with [place] Divorced since 89 Significant Other, wants her to do better, is interested in marrying her or moving in Don't believe being a widfe is her calling, Has always been a caregiver Son has a dog that he brings over,   [employer] on phone Facebook,  [place] on Sundays for the family  Birth dad is astranged as of 3 years ago On and off with him Mom passed from alcohol and abuse by step dad Grand in 2 bad car accidents Daughter 2 divorces and abuse  Want to get closer daughter and grandbaby (both in counseling), want to find more motivation to get out of bed,
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
## 2018-07-12 ENCOUNTER — Ambulatory Visit (INDEPENDENT_AMBULATORY_CARE_PROVIDER_SITE_OTHER): Payer: Medicare HMO | Admitting: Psychiatry

## 2018-07-12 ENCOUNTER — Encounter (HOSPITAL_COMMUNITY): Payer: Self-pay | Admitting: Psychiatry

## 2018-07-12 DIAGNOSIS — F411 Generalized anxiety disorder: Secondary | ICD-10-CM

## 2018-07-12 DIAGNOSIS — F4310 Post-traumatic stress disorder, unspecified: Secondary | ICD-10-CM | POA: Diagnosis not present

## 2018-07-12 DIAGNOSIS — F3181 Bipolar II disorder: Secondary | ICD-10-CM | POA: Diagnosis not present
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
## 2018-07-20 ENCOUNTER — Telehealth: Payer: Self-pay | Admitting: *Deleted

## 2018-07-20 ENCOUNTER — Encounter

## 2018-07-20 ENCOUNTER — Ambulatory Visit (INDEPENDENT_AMBULATORY_CARE_PROVIDER_SITE_OTHER): Payer: Medicare HMO | Admitting: Psychiatry

## 2018-07-20 ENCOUNTER — Ambulatory Visit: Payer: Medicare HMO | Admitting: Neurology

## 2018-07-20 DIAGNOSIS — F3181 Bipolar II disorder: Secondary | ICD-10-CM

## 2018-07-20 DIAGNOSIS — F4310 Post-traumatic stress disorder, unspecified: Secondary | ICD-10-CM | POA: Diagnosis not present

## 2018-07-20 DIAGNOSIS — F411 Generalized anxiety disorder: Secondary | ICD-10-CM

## 2018-07-20 NOTE — Telephone Encounter (Signed)
Pt no showed her visit with Dr. Lucia Gaskins on 07/20/2018.

## 2018-07-23 ENCOUNTER — Encounter (HOSPITAL_COMMUNITY): Payer: Self-pay | Admitting: Psychiatry

## 2018-07-23 NOTE — Addendum Note (Signed)
Addended by: Hilbert Odor on: 07/23/2018 06:06 PM   Modules accepted: Level of Service
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
## 2018-07-27 ENCOUNTER — Ambulatory Visit (HOSPITAL_COMMUNITY): Payer: Medicare HMO | Admitting: Psychiatry
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
## 2018-07-28 ENCOUNTER — Ambulatory Visit (HOSPITAL_COMMUNITY): Payer: Medicare HMO | Admitting: Psychiatry

## 2018-07-31 ENCOUNTER — Other Ambulatory Visit (HOSPITAL_COMMUNITY): Payer: Self-pay | Admitting: Psychiatry

## 2018-07-31 DIAGNOSIS — F5105 Insomnia due to other mental disorder: Secondary | ICD-10-CM

## 2018-07-31 DIAGNOSIS — F4001 Agoraphobia with panic disorder: Secondary | ICD-10-CM

## 2018-07-31 DIAGNOSIS — F333 Major depressive disorder, recurrent, severe with psychotic symptoms: Secondary | ICD-10-CM

## 2018-07-31 DIAGNOSIS — F411 Generalized anxiety disorder: Secondary | ICD-10-CM

## 2018-07-31 DIAGNOSIS — F4010 Social phobia, unspecified: Secondary | ICD-10-CM

## 2018-07-31 DIAGNOSIS — F99 Mental disorder, not otherwise specified: Principal | ICD-10-CM

## 2018-08-02 ENCOUNTER — Encounter (HOSPITAL_COMMUNITY): Payer: Self-pay | Admitting: Psychiatry

## 2018-08-02 ENCOUNTER — Other Ambulatory Visit: Payer: Self-pay

## 2018-08-02 ENCOUNTER — Ambulatory Visit (INDEPENDENT_AMBULATORY_CARE_PROVIDER_SITE_OTHER): Payer: Medicare HMO | Admitting: Psychiatry

## 2018-08-02 DIAGNOSIS — F4310 Post-traumatic stress disorder, unspecified: Secondary | ICD-10-CM | POA: Diagnosis not present

## 2018-08-02 DIAGNOSIS — F411 Generalized anxiety disorder: Secondary | ICD-10-CM

## 2018-08-02 DIAGNOSIS — F3181 Bipolar II disorder: Secondary | ICD-10-CM | POA: Diagnosis not present
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
## 2018-08-03 ENCOUNTER — Ambulatory Visit (HOSPITAL_COMMUNITY): Payer: Medicare HMO | Admitting: Psychiatry

## 2018-08-07 ENCOUNTER — Ambulatory Visit: Payer: Medicare HMO | Admitting: Podiatry

## 2018-08-17 ENCOUNTER — Other Ambulatory Visit: Payer: Self-pay

## 2018-08-17 ENCOUNTER — Encounter (HOSPITAL_COMMUNITY): Payer: Self-pay | Admitting: Psychiatry

## 2018-08-17 ENCOUNTER — Ambulatory Visit: Payer: Medicare HMO | Admitting: Podiatry

## 2018-08-17 ENCOUNTER — Ambulatory Visit (INDEPENDENT_AMBULATORY_CARE_PROVIDER_SITE_OTHER): Payer: Medicare HMO | Admitting: Psychiatry

## 2018-08-17 DIAGNOSIS — F411 Generalized anxiety disorder: Secondary | ICD-10-CM

## 2018-08-17 DIAGNOSIS — F4310 Post-traumatic stress disorder, unspecified: Secondary | ICD-10-CM | POA: Diagnosis not present

## 2018-08-17 DIAGNOSIS — F3181 Bipolar II disorder: Secondary | ICD-10-CM

## 2018-08-17 NOTE — Progress Notes (Signed)
Virtual Visit via Telephone Note  I connected with [name] on 08/17/18 at 10:00 AM EDT by telephone and verified that I am speaking with the correct person using two identifiers.   I discussed the limitations, risks, security and privacy concerns of performing an evaluation and management service by telephone and the availability of in person appointments. I also discussed with the patient that there may be a patient responsible charge related to this service. The patient expressed understanding and agreed to proceed.   History of Present Illness:   GAD, Bipolar II disorder, Posttraumatic Stress Disorder   Observations/Objective: Matie presented as most depressed since start of treatment. Speech was soft, sad and slowed. After checking in for several minutes, she became overwhelmed with emotion and requested that we reschedule to appointment for another time, because she did not feel comfortable crying over the phone. We discussed meeting in person or via video next time, for Counselor to be more aware of mood, facial expressions and affect. Janyth was triggered by processing of negative and racing thoughts she has been having in relations to the grief and loss of her mother. She reports having increased anxiety, restless sleep. Only getting out of bed and bathing every other day. Counselor assessed her social outlets and support system in light of COVID-19 regulations. Colie reports her friend comes to see her every 4 days, but that she is purposefully isolating self from others. She is good with having to social distance because she enjoys being alone. Counselor processed medication effectiveness and needs. Counselor will have the Psych Nurse follow up with her today to address med issues and to make a plan. Counselor encouraged Coralina to reach out to pharmacy to ensure that she is up to date with needed medications.   Assessment and Plan: Counselor ended the session early per the  request of Onetha due to her overwhelming feelings surrounding the grief and loss of mother. Counselor encouraged Dorsa to get on a daily routine that encouraged her eating meals, getting out of bed for hours at a time, getting fresh air, bathing, taking medications as prescribed and connecting with friends and family by phone or in person if possible. She will talk with Psych Nurse today and has a follow up with pain doctor on Friday.  Follow Up Instructions: We will plan to meet in person or over webex in 2 weeks. Counselor will call to check in on her between sessions to ensure she is safe and has become regulated.    I discussed the assessment and treatment plan with the patient. The patient was provided an opportunity to ask questions and all were answered. The patient agreed with the plan and demonstrated an understanding of the instructions.   The patient was advised to call back or seek an in-person evaluation if the symptoms worsen or if the condition fails to improve as anticipated.  I provided 20 minutes of non-face-to-face time during this encounter.   Hilbert Odor, LCSW

## 2018-08-21 ENCOUNTER — Other Ambulatory Visit (HOSPITAL_COMMUNITY): Payer: Self-pay | Admitting: Psychiatry

## 2018-08-21 DIAGNOSIS — F333 Major depressive disorder, recurrent, severe with psychotic symptoms: Secondary | ICD-10-CM

## 2018-08-21 DIAGNOSIS — F411 Generalized anxiety disorder: Secondary | ICD-10-CM

## 2018-08-21 DIAGNOSIS — F4001 Agoraphobia with panic disorder: Secondary | ICD-10-CM

## 2018-08-22 ENCOUNTER — Telehealth: Payer: Self-pay

## 2018-08-22 NOTE — Telephone Encounter (Signed)
Pending approval for Aimovig 140 mg/mL Key: [id number] ICD 10 code: G43.711   PA Case: [id number], Status: Approved, Coverage Starts on: 05/24/2018 12:00:00 AM, Coverage Ends on: 05/24/2019 12:00:00 AM. Questions? Contact [phone].

## 2018-08-22 NOTE — Telephone Encounter (Signed)
Approval letter has faxed to patient's pharmacy. Listed below. Confirmation fax has been received.    [facility] [address] ([address]), [place] - [address] [phone] (Phone) [phone] (Fax)

## 2018-08-28 ENCOUNTER — Telehealth (HOSPITAL_COMMUNITY): Payer: Self-pay

## 2018-08-28 NOTE — Telephone Encounter (Signed)
Medication management - Refill fax request for patient's prescribed Clonazepam received.  Patient last seen 06/29/18, cancelled 07/27/18 and 08/02/08.  No current appt pending.

## 2018-08-31 ENCOUNTER — Other Ambulatory Visit: Payer: Self-pay

## 2018-08-31 ENCOUNTER — Encounter (HOSPITAL_COMMUNITY): Payer: Self-pay | Admitting: Psychiatry

## 2018-08-31 ENCOUNTER — Ambulatory Visit (INDEPENDENT_AMBULATORY_CARE_PROVIDER_SITE_OTHER): Payer: Medicare HMO | Admitting: Psychiatry

## 2018-08-31 ENCOUNTER — Ambulatory Visit (HOSPITAL_COMMUNITY): Payer: Medicare HMO | Admitting: Psychiatry

## 2018-08-31 DIAGNOSIS — F411 Generalized anxiety disorder: Secondary | ICD-10-CM

## 2018-08-31 DIAGNOSIS — F333 Major depressive disorder, recurrent, severe with psychotic symptoms: Secondary | ICD-10-CM

## 2018-08-31 DIAGNOSIS — F3181 Bipolar II disorder: Secondary | ICD-10-CM

## 2018-08-31 DIAGNOSIS — F4001 Agoraphobia with panic disorder: Secondary | ICD-10-CM

## 2018-08-31 DIAGNOSIS — F4310 Post-traumatic stress disorder, unspecified: Secondary | ICD-10-CM

## 2018-08-31 DIAGNOSIS — F4010 Social phobia, unspecified: Secondary | ICD-10-CM

## 2018-08-31 DIAGNOSIS — F5105 Insomnia due to other mental disorder: Secondary | ICD-10-CM

## 2018-08-31 DIAGNOSIS — F99 Mental disorder, not otherwise specified: Secondary | ICD-10-CM

## 2018-08-31 MED ORDER — ZALEPLON 10 MG PO CAPS: ORAL_CAPSULE | ORAL | 0 refills | Status: DC

## 2018-08-31 MED ORDER — PRAZOSIN HCL 1 MG PO CAPS: 1.0000 mg | ORAL_CAPSULE | Freq: Every day | ORAL | 1 refills | Status: DC

## 2018-08-31 MED ORDER — LAMOTRIGINE 25 MG PO TABS: ORAL_TABLET | ORAL | 1 refills | Status: DC

## 2018-08-31 MED ORDER — CLONAZEPAM 0.5 MG PO TABS: 0.5000 mg | ORAL_TABLET | Freq: Three times a day (TID) | ORAL | 1 refills | Status: DC | PRN

## 2018-08-31 NOTE — Progress Notes (Unsigned)
Virtual Visit via Telephone Note  I connected with [name] on 08/31/18 at  4:30 PM EDT by telephone and verified that I am speaking with the correct person using two identifiers.   I discussed the limitations, risks, security and privacy concerns of performing an evaluation and management service by telephone and the availability of in person appointments. I also discussed with the patient that there may be patient responsible charge related to this service. The patient expressed understanding and agreed to proceed.   History of Present Illness:    Observations/Objective:   Assessment and Plan: MDD-recurrent, severe with psychotic features; GAD; panic disorder with agoraphobia; social anxiety disorder; insomnia   D/C Cymbalta-side effect of hair loss Start prazosin 1 mg p.o. nightly for nightmares Continue Sonata 10 mg p.o. nightly as needed insomnia Increase Lamictal by titration to 50 mg twice daily Increase Klonopin to 0.5 mg 3 times daily as needed anxiety and/or insomnia   I have referred the patient for a TMS evaluation.  She has been on numerous antidepressants, mood stabilizers and antipsychotics and reports no improvement in depression symptoms.    Follow Up Instructions: Follow up with me on Oct 12, 2018 at 4pm   I discussed the assessment and treatment plan with the patient. The patient was provided an opportunity to ask questions and all were answered. The patient agreed with the plan and demonstrated an understanding of the instructions.   The patient was advised to call back or seek an in-person evaluation if the symptoms worsen or if the condition fails to improve as anticipated.  I provided 45 minutes of non-face-to-face time during this encounter.   Oletta Darter, MD
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
## 2018-09-01 ENCOUNTER — Ambulatory Visit (HOSPITAL_COMMUNITY): Payer: Medicare HMO | Admitting: Psychiatry

## 2018-09-05 ENCOUNTER — Telehealth: Payer: Self-pay | Admitting: Neurology

## 2018-09-05 NOTE — Telephone Encounter (Signed)
I called and spoke with the patient regarding changing her visit to a VV. She stated that she could not get her webcam to work when trying to set up a VV earlier and would like to do a phone visit if possible. I informed her that we would bill her insurance for the phone visit and she gave consent to this. I also moved her apt up 30 minutes and she agreed to that time change.

## 2018-09-05 NOTE — Telephone Encounter (Signed)
I reviewed patient's chart and she has been seen in the ED and by neurology last month. 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 For this patient I need to see her in the office, I will have to have an exam given the variability of her symptoms and exam while recently in the hospital.

## 2018-09-06 NOTE — Telephone Encounter (Signed)
I spoke with the patient and discussed that she would need to be examined in person rather than telemedicine. D/t [phone] pandemic, will not have appt in office right now to minimize the risks. I r/s the patient for Tuesday 6/16 @ 2:30 pm. Advised we will be in touch if there are any further issues with pandemic. She verbalized appreciation.   Canceled 4/16 appt.

## 2018-09-07 ENCOUNTER — Ambulatory Visit: Payer: Medicare HMO | Admitting: Neurology

## 2018-09-08 ENCOUNTER — Other Ambulatory Visit: Payer: Self-pay

## 2018-09-08 ENCOUNTER — Ambulatory Visit (HOSPITAL_COMMUNITY): Payer: Medicare HMO | Admitting: Psychiatry

## 2018-09-13 ENCOUNTER — Ambulatory Visit (HOSPITAL_COMMUNITY): Payer: Medicare HMO | Admitting: Psychiatry

## 2018-09-13 ENCOUNTER — Other Ambulatory Visit (HOSPITAL_COMMUNITY): Payer: Self-pay | Admitting: Psychiatry

## 2018-09-13 DIAGNOSIS — F333 Major depressive disorder, recurrent, severe with psychotic symptoms: Secondary | ICD-10-CM

## 2018-09-14 ENCOUNTER — Ambulatory Visit (HOSPITAL_COMMUNITY): Payer: Medicare HMO | Admitting: Psychiatry

## 2018-09-15 ENCOUNTER — Ambulatory Visit (HOSPITAL_COMMUNITY): Payer: Medicare HMO | Admitting: Psychiatry

## 2018-09-16 ENCOUNTER — Other Ambulatory Visit: Payer: Self-pay

## 2018-09-16 ENCOUNTER — Encounter (HOSPITAL_COMMUNITY): Payer: Self-pay

## 2018-09-16 ENCOUNTER — Emergency Department (HOSPITAL_COMMUNITY)
Admission: EM | Admit: 2018-09-16 | Discharge: 2018-09-16 | Disposition: A | Discharge status: Home / Self Care | Payer: Medicare HMO | Attending: Emergency Medicine | Admitting: Emergency Medicine

## 2018-09-16 ENCOUNTER — Emergency Department (HOSPITAL_COMMUNITY): Payer: Medicare HMO

## 2018-09-16 DIAGNOSIS — W182XXA Fall in (into) shower or empty bathtub, initial encounter: Secondary | ICD-10-CM | POA: Insufficient documentation

## 2018-09-16 DIAGNOSIS — R55 Syncope and collapse: Secondary | ICD-10-CM | POA: Insufficient documentation

## 2018-09-16 DIAGNOSIS — I10 Essential (primary) hypertension: Secondary | ICD-10-CM | POA: Diagnosis not present

## 2018-09-16 DIAGNOSIS — Y939 Activity, unspecified: Secondary | ICD-10-CM | POA: Insufficient documentation

## 2018-09-16 DIAGNOSIS — J45909 Unspecified asthma, uncomplicated: Secondary | ICD-10-CM | POA: Insufficient documentation

## 2018-09-16 DIAGNOSIS — Y92002 Bathroom of unspecified non-institutional (private) residence single-family (private) house as the place of occurrence of the external cause: Secondary | ICD-10-CM | POA: Insufficient documentation

## 2018-09-16 DIAGNOSIS — W19XXXA Unspecified fall, initial encounter: Secondary | ICD-10-CM

## 2018-09-16 DIAGNOSIS — Z79899 Other long term (current) drug therapy: Secondary | ICD-10-CM | POA: Diagnosis not present

## 2018-09-16 DIAGNOSIS — Y999 Unspecified external cause status: Secondary | ICD-10-CM | POA: Diagnosis not present

## 2018-09-16 LAB — URINALYSIS, ROUTINE W REFLEX MICROSCOPIC
Bilirubin Urine: NEGATIVE
Glucose, UA: NEGATIVE mg/dL
Hgb urine dipstick: NEGATIVE
Ketones, ur: NEGATIVE mg/dL
Leukocytes,Ua: NEGATIVE
Nitrite: NEGATIVE
Protein, ur: NEGATIVE mg/dL
Specific Gravity, Urine: 1.006 (ref 1.005–1.030)
pH: 6 (ref 5.0–8.0)

## 2018-09-16 LAB — CBC WITH DIFFERENTIAL/PLATELET
Abs Immature Granulocytes: 0.03 10*3/uL (ref 0.00–0.07)
Basophils Absolute: 0 10*3/uL (ref 0.0–0.1)
Basophils Relative: 0 %
Eosinophils Absolute: 0.1 10*3/uL (ref 0.0–0.5)
Eosinophils Relative: 2 %
HCT: 42.4 % (ref 36.0–46.0)
Hemoglobin: 12.8 g/dL (ref 12.0–15.0)
Immature Granulocytes: 0 %
Lymphocytes Relative: 18 %
Lymphs Abs: 1.2 10*3/uL (ref 0.7–4.0)
MCH: 27.6 pg (ref 26.0–34.0)
MCHC: 30.2 g/dL (ref 30.0–36.0)
MCV: 91.6 fL (ref 80.0–100.0)
Monocytes Absolute: 1 10*3/uL (ref 0.1–1.0)
Monocytes Relative: 14 %
Neutro Abs: 4.6 10*3/uL (ref 1.7–7.7)
Neutrophils Relative %: 66 %
Platelets: 305 10*3/uL (ref 150–400)
RBC: 4.63 MIL/uL (ref 3.87–5.11)
RDW: 13.9 % (ref 11.5–15.5)
WBC: 7 10*3/uL (ref 4.0–10.5)
nRBC: 0 % (ref 0.0–0.2)

## 2018-09-16 LAB — COMPREHENSIVE METABOLIC PANEL
ALT: 22 U/L (ref 0–44)
AST: 26 U/L (ref 15–41)
Albumin: 4.3 g/dL (ref 3.5–5.0)
Alkaline Phosphatase: 79 U/L (ref 38–126)
Anion gap: 7 (ref 5–15)
BUN: 19 mg/dL (ref 6–20)
CO2: 27 mmol/L (ref 22–32)
Calcium: 9 mg/dL (ref 8.9–10.3)
Chloride: 106 mmol/L (ref 98–111)
Creatinine, Ser: 0.74 mg/dL (ref 0.44–1.00)
GFR calc Af Amer: >60 mL/min (ref >60)
GFR calc non Af Amer: >60 mL/min (ref >60)
Glucose, Bld: 104 mg/dL — ABNORMAL HIGH (ref 70–99)
Potassium: 3.7 mmol/L (ref 3.5–5.1)
Sodium: 140 mmol/L (ref 135–145)
Total Bilirubin: 0.4 mg/dL (ref 0.3–1.2)
Total Protein: 8.5 g/dL — ABNORMAL HIGH (ref 6.5–8.1)

## 2018-09-16 MED ORDER — SODIUM CHLORIDE 0.9 % IV BOLUS
1000.0000 mL | Freq: Once | INTRAVENOUS | Status: AC
  Administered 2018-09-16: 1000 mL via INTRAVENOUS

## 2018-09-16 NOTE — Discharge Instructions (Signed)
Follow up with PCP or neurology for your recurrent syncope.  Return to the ED with any new or worsening symptoms

## 2018-09-16 NOTE — ED Provider Notes (Signed)
[place] at [place].  See note for full HPI.  Summation 53 year old female with history of recurrent syncope presents for evaluation of generally not feeling well.  Had syncopal episode yesterday states she hit the right side of her head on the bathtub.  Only states that she was possibly unresponsive.  Patient has been followed by cardiology as well as neurology.  Had positive tilt table test years ago.  Patient has had increased anxiety due to her mother who possibly has a birth of a coming up.  Has had lack of appetite x2 weeks.  Had urinary frequency without dysuria.  Denies fever, nausea, vomiting, chest pain, sudden onset thunderclap headache, dizziness, unilateral weakness, shortness of breath, abdominal pain, numbness or tingling her extremities.  Patient states her syncope was related to her previous episodes of syncope.  No chest pain, shortness of breath prior.  Patient pending imaging, labs, urine at shift change.  [facility].  If imaging, urine, labs negative appropriate for DC home with close PCP follow-up. She is been able to ambulate in department without difficulty as well as tolerating p.o.'s currently.  Physical Exam  BP 121/71 (BP Location: Left Arm)   Pulse 93   Temp 98.9 F (37.2 C) (Oral)   Resp 18   SpO2 100%   Physical Exam Vitals signs and nursing note reviewed.  Constitutional:      General: She is not in acute distress.    Appearance: She is well-developed. She is not ill-appearing, toxic-appearing or diaphoretic.  HENT:     Head: Normocephalic and atraumatic.     Comments: No facial trauma, hematomas, contusion or abrasions. NO facial droop.    Mouth/Throat:     Mouth: Mucous membranes are moist.     Pharynx: Oropharynx is clear.  Eyes:     Extraocular Movements: Extraocular movements intact.     Pupils: Pupils are equal, round, and reactive to light.     Comments: No horizontal, vertical or rotational nystagmus    Neck:     Musculoskeletal: Normal range of motion and neck supple.     Comments: No neck stiffness or neck rigidity. No meningismus.  Cardiovascular:     Rate and Rhythm: Normal rate.     Heart sounds: Normal heart sounds. No murmur. No friction rub. No gallop.   Pulmonary:     Effort: Pulmonary effort is normal. No respiratory distress.     Breath sounds: Normal breath sounds.     Comments: Clear to ascultation bilaterally without wheeze, rhonchi or rales. No assessory muscle usage. Speak in full sentences without difficulty. NO dysphagia.  Abdominal:     General: Bowel sounds are normal. There is no distension.     Palpations: Abdomen is soft.  Musculoskeletal: Normal range of motion.        General: No swelling or tenderness.     Comments: Lower extremities without edema, erythema, ecchymosis or warmth.  No swelling or tenderness to bilateral calves.  2+ DP, PT pulses bilaterally.  Skin:    General: Skin is warm and dry.     Comments: Brisk capillary refill. No rashes or lesions.  Neurological:     Mental Status: She is alert.     Comments: Mental Status:  Alert, oriented, thought content appropriate. Speech fluent without evidence of aphasia. Able to follow 2 step commands without difficulty.  Cranial Nerves:  II:  Peripheral visual fields grossly normal, pupils equal, round, reactive to light III,IV, VI:  ptosis not present, extra-ocular motions intact bilaterally  V,VII: smile symmetric, facial light touch sensation equal VIII: hearing grossly normal bilaterally  IX,X: midline uvula rise  XI: bilateral shoulder shrug equal and strong XII: midline tongue extension  Motor:  5/5 in upper and lower extremities bilaterally including strong and equal grip strength and dorsiflexion/plantar flexion Sensory: Pinprick and light touch normal in all extremities.  Deep Tendon Reflexes: 2+ and symmetric  Cerebellar: normal finger-to-nose with bilateral upper extremities Gait: normal gait  and balance CV: distal pulses palpable throughout      ED Course/Procedures   Clinical Course as of Sep 16 1650  Sat Sep 16, 2018  1510 Care signed out at change of shift pending Ct/labs.   [LM]    Clinical Course User Index [LM] Jeannie Fend, PA-C   Procedures Ct Head Wo Contrast  Result Date: 09/16/2018 CLINICAL DATA:  Syncope EXAM: CT HEAD WITHOUT CONTRAST TECHNIQUE: Contiguous axial images were obtained from the base of the skull through the vertex without intravenous contrast. COMPARISON:  03/21/2018 FINDINGS: Brain: There is no mass effect, midline shift, or acute intracranial hemorrhage. Brain parenchyma and ventricular system are within normal limits. Vascular: No hyperdense vessel or unexpected calcification. Skull: Cranium is intact. Sinuses/Orbits: Mastoid air cells and visualized paranasal sinuses are clear. Visualized orbits are within normal limits. Other: Noncontributory. IMPRESSION: No acute intracranial pathology. Electronically Signed   By: Jolaine Click M.D.   On: 09/16/2018 16:18   Labs Reviewed  COMPREHENSIVE METABOLIC PANEL - Abnormal; Notable for the following components:      Result Value   Glucose, Bld 104 (*)    Total Protein 8.5 (*)    All other components within normal limits  URINALYSIS, ROUTINE W REFLEX MICROSCOPIC - Abnormal; Notable for the following components:   Color, Urine STRAW (*)    All other components within normal limits  CBC WITH DIFFERENTIAL/PLATELET   MDM  53 year old female appears otherwise well presents for evaluation of generalized not feeling well.  Care transferred from previous provider at shift change.  In summation patient with history of recurrent syncope, followed by cardiology and neurology with positive tilt table test.  Has had increased anxiety, increased urination without dysuria.  Had a syncope episode yesterday and hit the right side of her head.  Possible LOC per family.  States this was similar to her previous episodes  of syncope.  No preceding CP or sudden onset thunderclap HA.  Presentation non concerning for [facility], ICH, Meningitis, or temporal arteritis. Pt is afebrile with no focal neuro deficits, nuchal rigidity, or change in vision. No preceding chest pain, shortness of breath, sudden onset thunderclap headache.  Patient appears overall well.  Afebrile, nonseptic, non-ill-appearing.  She has no tachycardia, tachypnea or hypoxia.  She was able to ambulate for previous provider without difficulty. She is tolerating p.o.'s. Her abdomen is soft, nontender without rebound or guarding. Normal musculoskeletal exam.  She is neurovascularly intact without neurologic deficits.  Low suspicion for ACS, PE, SAH, meningitis, CVA as cause of her symptoms.  Likely vasovagal given history and exam.  Lower extremity edema, erythema, ecchymosis or warmth.  No calf pains, no hx of PE or DVT.  I have low suspicion for PE as cause of syncope.  Will obtain labs, imaging, urine and reevaluate.  1625: Able to tolerate p.o. intake in department without difficulty.  She is ambulated in the hall without hypoxia or without dizziness.  She does not have ataxic gait. On evaluation patient has  a nonfocal neurologic exam.  Low suspicion for CVA as cause of her syncope.  Likely her recurrent vasovagal syncope which she is followed by cardiology and neurology for.  No evidence of UTI on urinalysis.  CBC without leukocytosis, hemoglobin 12.8 metabolic panel without electrolyte, renal or liver abnormality, head CT without evidence of mass or bleed.  Patient without arrhythmia or tachycardia while here in the department.  Patient without history of congestive heart failure, normal hematocrit, normal ECG, no shortness of breath and systolic blood pressure greater than 90; patient is low risk. Will plan for discharge home with close cardiology/PCP follow-up.  Possibility of recurrent syncope has been discussed. I discussed reasons to avoid driving until  cardiology/PCP followup and other safety prevention including use of ladders and working at heights.   Pt has remained hemodynamically stable throughout their time in the ED  1. Fall 2. Syncope       Alisson Rozell A, PA-C 09/16/18 1652    Virgina Norfolk, DO 09/16/18 1745

## 2018-09-16 NOTE — ED Triage Notes (Signed)
She c/o generalized h/a and "vertigo", of which she has a confirmed history. She also tells me she "passed out three times yesterday". She is in no distress.

## 2018-09-16 NOTE — ED Provider Notes (Signed)
[facility]-EMERGENCY DEPT Provider Note   CSN: [id number] Arrival date & time: 09/16/18  1315    History   Chief Complaint Chief Complaint  Patient presents with  . Headache  . Dizziness    HPI [name] is a 53 y.o. female.     53yo female with history of syncope presents with complaint of generally not feeling well. Patient states she had a syncopal episode yesterday and when she fell she hit the right side of her head on the bathtub and was unresponsive for 4 minutes per family members (states she is normally only unresponsive for 2 minutes). Patient states had tilt table testing and when she gets to a certain angle she passes out, was advised to try and keep her BP up and tighten her muscles when she feels an episode coming on. Patient states she has only slept 9 hours in the past 3 days due to feeling very anxious, states her mother passed 18 years ago and her birthday his coming up. Reports lack of appetite x 2 weeks, left leg feels like its burning at times. Had a cough 2 weeks ago, none currently. Feels hot/cold all the time for the past week. Reports urinary frequency without dysuria. Takes medication for constipation without relief. No known sick contacts.      Past Medical History:  Diagnosis Date  . Anxiety   . Asthma   . Depression   . Environmental allergies   . Fibromyalgia   . GERD (gastroesophageal reflux disease)   . Grave's disease   . Headache    migraines  . High cholesterol   . Hypertension   . Sciatica   . Syncope and collapse   . Vasovagal syncope   . Vertigo     Patient Active Problem List   Diagnosis Date Noted  . Leg weakness 03/21/2018  . Intractable chronic migraine without aura and without status migrainosus 04/27/2016  . Intractable chronic migraine without aura and with status migrainosus 01/14/2016  . Insomnia 05/27/2015  . Bipolar 2 disorder (HCC) 02/25/2015  . Severe depressed bipolar II disorder without  psychotic features (HCC) 02/25/2015  . Severe 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 (HCC) 10/10/2014  . GAD (generalized anxiety disorder) 10/10/2014  . Panic disorder with agoraphobia 10/10/2014  . Social anxiety disorder 10/10/2014  . Other complicated headache syndrome 09/24/2014  . Jaw pain-acute TMJ 09/24/14 09/24/2014  . Protein-calorie malnutrition, severe (HCC) 11/03/2012  . Severe malnutrition (HCC) 11/03/2012  . Lethargy 11/01/2012  . Drug overdose, multiple drugs 11/01/2012  . Hypokalemia 11/01/2012  . Depression 11/22/2011  . Chronic pain 11/22/2011  . Graves' disease   . Asthma   . GERD (gastroesophageal reflux disease)     Past Surgical History:  Procedure Laterality Date  . ABDOMINAL HYSTERECTOMY  2006   partial  . GASTRIC BYPASS  2008     OB History    Gravida  2   Para  2   Term      Preterm      AB      Living  2     SAB      TAB      Ectopic      Multiple      Live Births               Home Medications    Prior to Admission medications   Medication Sig Start Date End Date Taking? Authorizing Provider  albuterol (PROVENTIL HFA;VENTOLIN  HFA) 108 (90 BASE) MCG/ACT inhaler Inhale 2 puffs into the lungs every 6 (six) hours as needed for wheezing.    [provider]  azelastine (ASTELIN) 137 MCG/SPRAY nasal spray Place 1 spray into the nose 2 (two) times daily. Use in each nostril as directed    [provider]  BIOTIN 5000 PO Take 5,000 Units by mouth daily.     [provider]  budesonide-formoterol (SYMBICORT) 80-4.5 MCG/ACT inhaler Inhale 2 puffs into the lungs 2 (two) times daily.    [provider]  cetirizine (ZYRTEC) 10 MG tablet Take 10 mg by mouth daily.    [provider]  cholecalciferol (VITAMIN D) 1000 UNITS tablet Take 1,000 Units by mouth daily.     [provider]  clonazePAM (KLONOPIN) 0.5 MG tablet Take 1 tablet (0.5 mg total) by mouth 3 (three)  times daily as needed. for anxiety 08/31/18   Oletta Darter, MD  cyclobenzaprine (FLEXERIL) 10 MG tablet Take 10 mg by mouth 2 (two) times daily as needed. msucle spasms 02/01/18   [provider]  diclofenac sodium (VOLTAREN) 1 % GEL Apply 2 g topically 4 (four) times daily - after meals and at bedtime. 02/28/18   [provider]  Epinastine HCl (ELESTAT) 0.05 % ophthalmic solution Place 1 drop into both eyes 2 (two) times daily as needed (itching).     [provider]  EPINEPHrine (EPIPEN) 0.3 mg/0.3 mL DEVI Inject 0.3 mg into the muscle as needed. For allergic reaction to stinging insects, mushrooms, and shellfish     [provider]  Erenumab-aooe (AIMOVIG) 140 MG/ML SOAJ Inject 140 mg into the skin every 30 (thirty) days. 11/16/17   Anson Fret, MD  estradiol (ESTRACE) 0.5 MG tablet Take 0.5 mg by mouth daily.    [provider]  fluconazole (DIFLUCAN) 200 MG tablet Take 200 mg by mouth daily. 03/09/18   [provider]  fluticasone (FLONASE) 50 MCG/ACT nasal spray Place 1 spray into both nostrils as needed (allergies/runny nose).     [provider]  gabapentin (NEURONTIN) 300 MG capsule Take 300 mg by mouth 2 (two) times daily.    [provider]  ibuprofen (ADVIL,MOTRIN) 800 MG tablet Take 800 mg by mouth every 8 (eight) hours as needed for moderate pain.    [provider]  lamoTRIgine (LAMICTAL) 25 MG tablet Take 1 tablet (25 mg total) by mouth every morning for 14 days, THEN 2 tablets (50 mg total) at bedtime for 14 days, THEN 2 tablets (50 mg total) 2 (two) times daily. 08/31/18 11/27/18  Oletta Darter, MD  meclizine (ANTIVERT) 25 MG tablet Take 1 tablet (25 mg total) by mouth every 6 (six) hours as needed for dizziness. 10/12/13   Purvis Sheffield, MD  Menthol-Methyl Salicylate (BEN GAY GREASELESS) 10-15 % greaseless cream Apply 1 application topically 3 (three) times daily as needed for pain.    [provider]  mometasone-formoterol (DULERA) 100-5 MCG/ACT AERO Inhale 2 puffs into the lungs 2 (two) times daily as needed for wheezing or shortness of breath.     [provider]  montelukast (SINGULAIR) 10 MG tablet Take 1 tablet (10 mg total) by mouth at bedtime. 02/13/15   Danelle Berry, PA-C  Multiple Vitamins-Minerals (MULTIVITAMIN WITH MINERALS) tablet Take 1 tablet by mouth daily.    [provider]  naloxegol oxalate (MOVANTIK) 25 MG TABS tablet Take 25 mg by mouth daily.    [provider]  Oxycodone HCl  10 MG TABS Take 10 mg by mouth 2 (two) times daily as needed. 03/17/18   [provider]  oxyCODONE-acetaminophen (PERCOCET/ROXICET) 5-325 MG per tablet Take one every 6 hours if needed for cough 06/12/14   Peyton Najjar, MD  oxymetazoline (AFRIN NASAL SPRAY) 0.05 % nasal spray Place 1 spray into both nostrils 2 (two) times daily. Patient taking differently: Place 1 spray into both nostrils 2 (two) times daily as needed for congestion.  07/07/15   Barrett Henle, PA-C  polyethylene glycol [facility] / Ethelene Hal) packet Take 17 g by mouth every other day.     [provider]  prazosin (MINIPRESS) 1 MG capsule Take 1 capsule (1 mg total) by mouth at bedtime. 08/31/18   Oletta Darter, MD  ranitidine (ZANTAC) 150 MG tablet Take 150 mg by mouth daily.    [provider]  SUMAtriptan (IMITREX) 100 MG tablet Take 1 tablet (100 mg total) by mouth once as needed for up to 1 dose for migraine. May repeat in 2 hours if headache persists or recurs. 04/12/17   Anson Fret, MD  vitamin B-12 (CYANOCOBALAMIN) 100 MCG tablet Take 100 mcg by mouth daily.    [provider]  zaleplon (SONATA) 10 MG capsule TAKE 1 CAPSULE BY MOUTH AT BEDTIME MAY  REPEAT  IF  NECESSARY 08/31/18   Oletta Darter, MD    Family History Family History  Problem Relation Age of Onset  . Hypertension Mother   . Cirrhosis Mother   . Alcohol abuse Mother    . Depression Mother   . Physical abuse Mother   . Hypertension Sister   . Alcohol abuse Sister   . Drug abuse Sister   . Depression Sister   . Anxiety disorder Sister   . OCD Sister   . Hypertension Father   . Alcohol abuse Father   . ADD / ADHD Other   . Depression Sister   . Diabetes Other   . Hypertension Other   . Diabetes Maternal Uncle   . Seizures Cousin     Social History Social History   Tobacco Use  . Smoking status: Never Smoker  . Smokeless tobacco: Never Used  Substance Use Topics  . Alcohol use: Yes    Alcohol/week: 1.0 standard drinks    Types: 1 Glasses of wine per week    Comment: one glass of wine every 3 months  . Drug use: No     Allergies   Bee venom; Coconut fatty acids; Mushroom ext cmplx(shiitake-reishi-mait); Nutritional supplements; Other; Shellfish allergy; Strawberry extract; and Hydrocodone-acetaminophen   Review of Systems Review of Systems  Constitutional: Positive for appetite change, chills and fatigue.  Respiratory: Negative for cough and shortness of breath.   Cardiovascular: Negative for chest pain.  Gastrointestinal: Positive for constipation and nausea. Negative for abdominal pain, diarrhea and vomiting.  Genitourinary: Positive for frequency. Negative for dysuria.  Musculoskeletal: Positive for arthralgias and myalgias.  Skin: Negative for rash and wound.  Allergic/Immunologic: Negative for immunocompromised state.  Neurological: Positive for headaches. Negative for speech difficulty.  Hematological: Negative for adenopathy. Does not bruise/bleed easily.  Psychiatric/Behavioral: The patient is nervous/anxious.   All other systems reviewed and are negative.    Physical Exam Updated Vital Signs BP (!) 144/91 (BP Location: Right Arm)   Pulse 91   Temp 98.9 F (37.2 C) (Oral)   Resp 16   SpO2 98%   Physical Exam Vitals signs and nursing note reviewed.  Constitutional:  Appearance: She is well-developed. She is  obese.  HENT:     Head: Normocephalic and atraumatic.  Eyes:     Extraocular Movements: Extraocular movements intact.     Pupils: Pupils are equal, round, and reactive to light.  Neck:     Musculoskeletal: Neck supple.  Cardiovascular:     Rate and Rhythm: Normal rate and regular rhythm.     Heart sounds: Normal heart sounds. No murmur.  Pulmonary:     Effort: Pulmonary effort is normal.     Breath sounds: Normal breath sounds.  Abdominal:     Tenderness: There is no abdominal tenderness.  Musculoskeletal:        General: Tenderness present.     Cervical back: Normal.     Right upper arm: She exhibits tenderness. She exhibits no bony tenderness.     Right forearm: Normal.       Arms:     Comments: Tenderness right bicep area, no bony tenderness.   Skin:    General: Skin is warm and dry.  Neurological:     Mental Status: She is alert.     GCS: GCS eye subscore is 4. GCS verbal subscore is 5. GCS motor subscore is 6.     Cranial Nerves: No cranial nerve deficit, dysarthria or facial asymmetry.     Sensory: No sensory deficit.     Motor: No weakness.     Deep Tendon Reflexes: Reflexes normal. Babinski sign absent on the right side. Babinski sign absent on the left side.  Psychiatric:        Mood and Affect: Mood normal.        Speech: Speech normal.        Behavior: Behavior normal.      ED Treatments / Results  Labs (all labs ordered are listed, but only abnormal results are displayed) Labs Reviewed  URINALYSIS, ROUTINE W REFLEX MICROSCOPIC - Abnormal; Notable for the following components:      Result Value   Color, Urine STRAW (*)    All other components within normal limits  CBC WITH DIFFERENTIAL/PLATELET  COMPREHENSIVE METABOLIC PANEL    EKG None  Radiology No results found.  Procedures Procedures (including critical care time)  Medications Ordered in ED Medications  sodium chloride 0.9 % bolus 1,000 mL (1,000 mLs Intravenous New Bag/Given 09/16/18  1431)     Initial Impression / Assessment and Plan / ED Course  I have reviewed the triage vital signs and the nursing notes.  Pertinent labs & imaging results that were available during my care of the patient were reviewed by me and considered in my medical decision making (see chart for details).  Clinical Course as of Sep 15 1508  Sat Sep 16, 2018  1510 Care signed out at change of shift pending Ct/labs.   [LM]    Clinical Course User Index [LM] Jeannie Fend, PA-C      Final Clinical Impressions(s) / ED Diagnoses   Final diagnoses:  None    ED Discharge Orders    None       Alden Hipp 09/16/18 1510    Azalia Bilis, MD 09/16/18 [phone]

## 2018-09-18 ENCOUNTER — Ambulatory Visit: Payer: Medicare HMO | Admitting: Podiatry

## 2018-09-22 DIAGNOSIS — M1990 Unspecified osteoarthritis, unspecified site: Secondary | ICD-10-CM

## 2018-09-22 HISTORY — DX: Unspecified osteoarthritis, unspecified site: M19.90

## 2018-09-25 ENCOUNTER — Ambulatory Visit (INDEPENDENT_AMBULATORY_CARE_PROVIDER_SITE_OTHER): Payer: Medicare HMO

## 2018-09-25 ENCOUNTER — Other Ambulatory Visit: Payer: Self-pay

## 2018-09-25 ENCOUNTER — Encounter: Payer: Self-pay | Admitting: Podiatry

## 2018-09-25 ENCOUNTER — Ambulatory Visit (INDEPENDENT_AMBULATORY_CARE_PROVIDER_SITE_OTHER): Payer: Medicare HMO | Admitting: Podiatry

## 2018-09-25 ENCOUNTER — Ambulatory Visit: Payer: Medicare HMO | Admitting: Podiatry

## 2018-09-25 VITALS — Temp 98.1°F

## 2018-09-25 DIAGNOSIS — M792 Neuralgia and neuritis, unspecified: Secondary | ICD-10-CM | POA: Diagnosis not present

## 2018-09-25 DIAGNOSIS — M722 Plantar fascial fibromatosis: Secondary | ICD-10-CM

## 2018-09-25 DIAGNOSIS — M779 Enthesopathy, unspecified: Secondary | ICD-10-CM | POA: Diagnosis not present

## 2018-09-25 MED ORDER — METHYLPREDNISOLONE 4 MG PO TBPK: ORAL_TABLET | ORAL | 0 refills | Status: DC

## 2018-09-25 NOTE — Patient Instructions (Signed)

## 2018-09-29 ENCOUNTER — Other Ambulatory Visit (HOSPITAL_COMMUNITY): Payer: Self-pay | Admitting: Psychiatry

## 2018-09-29 DIAGNOSIS — F99 Mental disorder, not otherwise specified: Secondary | ICD-10-CM

## 2018-09-29 DIAGNOSIS — F5105 Insomnia due to other mental disorder: Secondary | ICD-10-CM

## 2018-10-03 ENCOUNTER — Telehealth: Payer: Self-pay | Admitting: *Deleted

## 2018-10-03 DIAGNOSIS — M779 Enthesopathy, unspecified: Secondary | ICD-10-CM

## 2018-10-03 DIAGNOSIS — M722 Plantar fascial fibromatosis: Secondary | ICD-10-CM

## 2018-10-03 DIAGNOSIS — M792 Neuralgia and neuritis, unspecified: Secondary | ICD-10-CM

## 2018-10-03 NOTE — Progress Notes (Signed)
Subjective:   Patient ID: [name], female   DOB: 53 y.o.   MRN: [id number]   HPI 53 year old female presents the office today for concerns of bilateral foot pain.  She states this is been ongoing for about 6 to 7 months.  She did go to the emergency room about 4 to 5 months ago.  She states that she has pain with walking and standing.  She denies any recent injury or trauma.  She complains of pain to the right bottom of the heel and she feels that she is walking on a "knot".  On the left foot she is getting pain and burning more to the outside aspect of the foot.  No significant swelling.  She does have a history of fibromyalgia.  No other concerns today.   Review of Systems  All other systems reviewed and are negative.  Past Medical History:  Diagnosis Date  . Anxiety   . Asthma   . Depression   . Environmental allergies   . Fibromyalgia   . GERD (gastroesophageal reflux disease)   . Grave's disease   . Headache    migraines  . High cholesterol   . Hypertension   . Sciatica   . Syncope and collapse   . Vasovagal syncope   . Vertigo     Past Surgical History:  Procedure Laterality Date  . ABDOMINAL HYSTERECTOMY  2006   partial  . GASTRIC BYPASS  2008     Current Outpatient Medications:  .  fexofenadine-pseudoephedrine (ALLEGRA-D) 60-120 MG 12 hr tablet, Take by mouth., Disp: , Rfl:  .  naloxone (NARCAN) nasal spray 4 mg/0.1 mL, , Disp: , Rfl:  .  pantoprazole (PROTONIX) 40 MG tablet, Take by mouth., Disp: , Rfl:  .  triamcinolone cream (KENALOG) 0.1 %, Apply topically., Disp: , Rfl:  .  albuterol (PROVENTIL HFA;VENTOLIN HFA) 108 (90 BASE) MCG/ACT inhaler, Inhale 2 puffs into the lungs every 6 (six) hours as needed for wheezing., Disp: , Rfl:  .  amoxicillin (AMOXIL) 875 MG tablet, TAKE 1 TABLET BY MOUTH TWICE DAILY FOR 5 DAYS, Disp: , Rfl:  .  azelastine (ASTELIN) 137 MCG/SPRAY nasal spray, Place 1 spray into the nose 2 (two) times daily. Use in each nostril as  directed, Disp: , Rfl:  .  benzonatate (TESSALON) 100 MG capsule, TAKE 1 CAPSULE BY MOUTH THREE TIMES DAILY AS NEEDED FOR UP TO 7 DAYS, Disp: , Rfl:  .  BIOTIN 5000 PO, Take 5,000 Units by mouth daily. , Disp: , Rfl:  .  budesonide-formoterol (SYMBICORT) 80-4.5 MCG/ACT inhaler, Inhale 2 puffs into the lungs 2 (two) times daily., Disp: , Rfl:  .  cetirizine (ZYRTEC) 10 MG tablet, Take 10 mg by mouth daily., Disp: , Rfl:  .  cholecalciferol (VITAMIN D) 1000 UNITS tablet, Take 1,000 Units by mouth daily. , Disp: , Rfl:  .  clonazePAM (KLONOPIN) 0.5 MG tablet, Take 1 tablet (0.5 mg total) by mouth 3 (three) times daily as needed. for anxiety, Disp: 90 tablet, Rfl: 1 .  cyclobenzaprine (FLEXERIL) 10 MG tablet, Take 10 mg by mouth 2 (two) times daily as needed. msucle spasms, Disp: , Rfl: 2 .  diclofenac sodium (VOLTAREN) 1 % GEL, Apply 2 g topically 4 (four) times daily - after meals and at bedtime., Disp: , Rfl: 1 .  Epinastine HCl (ELESTAT) 0.05 % ophthalmic solution, Place 1 drop into both eyes 2 (two) times daily as needed (itching). , Disp: , Rfl:  .  EPINEPHrine (EPIPEN) 0.3 mg/0.3 mL DEVI, Inject 0.3 mg into the muscle as needed. For allergic reaction to stinging insects, mushrooms, and shellfish , Disp: , Rfl:  .  Erenumab-aooe (AIMOVIG) 140 MG/ML SOAJ, Inject 140 mg into the skin every 30 (thirty) days., Disp: 1 pen, Rfl: 11 .  estradiol (ESTRACE) 0.5 MG tablet, Take 0.5 mg by mouth daily., Disp: , Rfl:  .  fluconazole (DIFLUCAN) 150 MG tablet, TAKE ONE TABLET BY MOUTH AS A ONE TIME DOSE, Disp: , Rfl:  .  fluconazole (DIFLUCAN) 200 MG tablet, Take 200 mg by mouth daily., Disp: , Rfl: 0 .  fluticasone (FLONASE) 50 MCG/ACT nasal spray, Place 1 spray into both nostrils as needed (allergies/runny nose). , Disp: , Rfl:  .  gabapentin (NEURONTIN) 300 MG capsule, Take 300 mg by mouth 2 (two) times daily., Disp: , Rfl:  .  ibuprofen (ADVIL,MOTRIN) 800 MG tablet, Take 800 mg by mouth every 8 (eight)  hours as needed for moderate pain., Disp: , Rfl:  .  lamoTRIgine (LAMICTAL) 25 MG tablet, Take 1 tablet (25 mg total) by mouth every morning for 14 days, THEN 2 tablets (50 mg total) at bedtime for 14 days, THEN 2 tablets (50 mg total) 2 (two) times daily., Disp: 60 tablet, Rfl: 1 .  meclizine (ANTIVERT) 25 MG tablet, Take 1 tablet (25 mg total) by mouth every 6 (six) hours as needed for dizziness., Disp: 20 tablet, Rfl: 0 .  Menthol-Methyl Salicylate (BEN GAY GREASELESS) 10-15 % greaseless cream, Apply 1 application topically 3 (three) times daily as needed for pain., Disp: , Rfl:  .  methylPREDNISolone (MEDROL DOSEPAK) 4 MG TBPK tablet, Take as directed, Disp: 21 tablet, Rfl: 0 .  mometasone-formoterol (DULERA) 100-5 MCG/ACT AERO, Inhale 2 puffs into the lungs 2 (two) times daily as needed for wheezing or shortness of breath. , Disp: , Rfl:  .  montelukast (SINGULAIR) 10 MG tablet, Take 1 tablet (10 mg total) by mouth at bedtime., Disp: 30 tablet, Rfl: 0 .  Multiple Vitamins-Minerals (MULTIVITAMIN WITH MINERALS) tablet, Take 1 tablet by mouth daily., Disp: , Rfl:  .  naloxegol oxalate (MOVANTIK) 25 MG TABS tablet, Take 25 mg by mouth daily., Disp: , Rfl:  .  Oxycodone HCl 10 MG TABS, Take 10 mg by mouth 2 (two) times daily as needed., Disp: , Rfl:  .  oxyCODONE-acetaminophen (PERCOCET/ROXICET) 5-325 MG per tablet, Take one every 6 hours if needed for cough, Disp: 12 tablet, Rfl: 0 .  oxymetazoline (AFRIN NASAL SPRAY) 0.05 % nasal spray, Place 1 spray into both nostrils 2 (two) times daily. (Patient taking differently: Place 1 spray into both nostrils 2 (two) times daily as needed for congestion. ), Disp: 30 mL, Rfl: 0 .  polyethylene glycol (MIRALAX / GLYCOLAX) packet, Take 17 g by mouth every other day. , Disp: , Rfl:  .  prazosin (MINIPRESS) 1 MG capsule, Take 1 capsule (1 mg total) by mouth at bedtime., Disp: 30 capsule, Rfl: 1 .  ranitidine (ZANTAC) 150 MG tablet, Take 150 mg by mouth daily.,  Disp: , Rfl:  .  RELISTOR 150 MG TABS, , Disp: , Rfl:  .  SUMAtriptan (IMITREX) 100 MG tablet, Take 1 tablet (100 mg total) by mouth once as needed for up to 1 dose for migraine. May repeat in 2 hours if headache persists or recurs., Disp: 9 tablet, Rfl: 12 .  vitamin B-12 (CYANOCOBALAMIN) 100 MCG tablet, Take 100 mcg by mouth daily., Disp: , Rfl:  .  zaleplon (SONATA) 10 MG capsule, TAKE 1 CAPSULE BY MOUTH AT BEDTIME MAY  REPEAT  IF  NECESSARY, Disp: 60 capsule, Rfl: 0  Allergies  Allergen Reactions  . Bee Venom Anaphylaxis    Other reaction(s): PRURITUS, RASH  . Coconut Fatty Acids Anaphylaxis    Patient allergic to coconut in general  . Mushroom Ext Cmplx(Shiitake-Reishi-Mait) Anaphylaxis  . Nutritional Supplements Anaphylaxis and Itching    walnuts  . Other Anaphylaxis, Hives and Itching    Ragweed  . Shellfish Allergy Anaphylaxis  . Strawberry Extract Anaphylaxis  . Hydrocodone-Acetaminophen Itching         Objective:  Physical Exam  General: AAO x3, NAD  Dermatological: Skin is warm, dry and supple bilateral. Nails x 10 are well manicured; remaining integument appears unremarkable at this time. There are no open sores, no preulcerative lesions, no rash or signs of infection present.  Vascular: Dorsalis Pedis artery and Posterior Tibial artery pedal pulses are 2/4 bilateral with immedate capillary fill time. Pedal hair growth present. No varicosities and no lower extremity edema present bilateral. There is no pain with calf compression, swelling, warmth, erythema.   Neruologic: Grossly intact via light touch bilateral. Vibratory intact via tuning fork bilateral. Protective threshold with Semmes Wienstein monofilament intact to all pedal sites bilateral.  She is reporting sharp pains to her feet as well with nerve symptoms.  Musculoskeletal: There is tenderness palpation of the plantar medial tubercle of the calcaneus insertion of plantar fashion on the right side.  The plantar  fascia appears to be intact.  No pain with lateral compression of calcaneus.  Minimal discomfort in the course the peroneal tendon just inferior to the lateral malleolus and proximal fifth metatarsal base.  Tendon appears to be intact.  Achilles tendon appears to be intact.  No other areas of pinpoint tenderness.  On the left side the majority of tenderness is to the lateral aspect of the ankle along the course of the peroneal tendon no break in the tendon appears to be intact.  No edema, erythema to this area.  Muscular strength 5/5 in all groups tested bilateral.  Gait: Unassisted, Nonantalgic.       Assessment:   Bilateral foot pain, plantar fasciitis/peroneal tendinitis; fibromyalgia     Plan:  -Treatment options discussed including all alternatives, risks, and complications -Etiology of symptoms were discussed -X-rays were obtained and reviewed with the patient. There is no evidence of acute fracture or stress fracture. -Medrol dose pack prescribed -A brace dispensed.  We discussed shoe modifications and orthotics.  Discussed stretching, icing exercises daily. -Given the prolonged nature of her pain we will order an MRI to rule out tendon tears. -Continue gabapentin  Return in about 4 weeks (around 10/23/2018).  Vivi BarrackMatthew R Wagoner DPM

## 2018-10-03 NOTE — Telephone Encounter (Signed)
-----   Message from Vivi Barrack, DPM sent at 10/03/2018  7:44 AM EDT ----- Can you please order MRI to rule out peroneal tendon tears b/l and right foot plantar fascial tear? Thanks.

## 2018-10-04 NOTE — Telephone Encounter (Signed)
[facility] - MEDICAID NOTIFICATION STATES, "THIS [place] MEDICAID MEMBER DOES NOT REQUIRE PRIOR AUTHORIZATION FOR OUTPATIENT RADIOLOGY THROUGH [facility] OR [facility] AT THIS TIME." Faxed orders to [facility]. 

## 2018-10-12 ENCOUNTER — Ambulatory Visit (INDEPENDENT_AMBULATORY_CARE_PROVIDER_SITE_OTHER): Payer: Medicare HMO | Admitting: Psychiatry

## 2018-10-12 ENCOUNTER — Encounter (HOSPITAL_COMMUNITY): Payer: Self-pay | Admitting: Psychiatry

## 2018-10-12 ENCOUNTER — Other Ambulatory Visit: Payer: Self-pay

## 2018-10-12 DIAGNOSIS — F99 Mental disorder, not otherwise specified: Secondary | ICD-10-CM

## 2018-10-12 DIAGNOSIS — F4001 Agoraphobia with panic disorder: Secondary | ICD-10-CM | POA: Diagnosis not present

## 2018-10-12 DIAGNOSIS — F333 Major depressive disorder, recurrent, severe with psychotic symptoms: Secondary | ICD-10-CM | POA: Diagnosis not present

## 2018-10-12 DIAGNOSIS — F5105 Insomnia due to other mental disorder: Secondary | ICD-10-CM | POA: Diagnosis not present

## 2018-10-12 DIAGNOSIS — F411 Generalized anxiety disorder: Secondary | ICD-10-CM

## 2018-10-12 MED ORDER — CLONAZEPAM 0.5 MG PO TABS: 0.5000 mg | ORAL_TABLET | Freq: Three times a day (TID) | ORAL | 1 refills | Status: DC | PRN

## 2018-10-12 MED ORDER — LAMOTRIGINE 25 MG PO TABS: ORAL_TABLET | ORAL | 1 refills | Status: DC

## 2018-10-12 MED ORDER — DOXEPIN HCL 10 MG/ML PO CONC: 5.0000 mg | Freq: Every evening | ORAL | 0 refills | Status: DC | PRN

## 2018-10-12 MED ORDER — PRAZOSIN HCL 1 MG PO CAPS: 1.0000 mg | ORAL_CAPSULE | Freq: Every day | ORAL | 1 refills | Status: DC

## 2018-10-12 NOTE — Progress Notes (Signed)
Virtual Visit via Telephone Note  I connected with [name] on 10/12/18 at  4:00 PM EDT by telephone and verified that I am speaking with the correct person using two identifiers.  Location: Patient: home Provider: home   I discussed the limitations, risks, security and privacy concerns of performing an evaluation and management service by telephone and the availability of in person appointments. I also discussed with the patient that there may be a patient responsible charge related to this service. The patient expressed understanding and agreed to proceed.   History of Present Illness: "I am doing a little better". [name] was a really rough month for her. It was a combination of the anniversary of her mother and uncle's death and other stressors. She spent about 2 weeks in bed due to depression. Pt was tossing and turning all night. Sonata was ineffective. She was only getting a few hrs of sleep with Sonata and 150mg  of Benadryl.  She was isolating and withdrawn. She had no motivation. [name] reports poor concentration and memory. Her son and daughter would bring food and she was only eating 1 meal a day. [name] was not washing or changing her clothes. She has been having frequent panic attacks. She has high anxiety with racing thoughts, restlessness. Her mind won't shut off. The Klonopin calms her if she takes 2 tabs. She has been taking 2-3 tabs/day. She ran out of Lamictal one week ago. People have been telling her she is very irritable. Pt reports she is irritable and her mood is "out of control" and her frustration tolerance is low. 3 days ago her friend came and got her out of bed and they went for a walk. For the last 2 days a different friend or family member will come and force her out of bed.  They stay with her in the apartment throughout the day and night.  They take her phone at night to force her to get on a regular sleep schedule.  And it is helping. Pt has been having more  migraines. Pt denies SI/HI. "These last 2-3 weeks have been a blur. I don't remember if the Lamictal had an effect". Pt wants something that will and is "willing to try anything now".     Observations/Objective: I spoke with patient on the phone.  She is calm and engaged in the conversation.  Her speech is slow and rather monotone.  Volume is normal.  Mood is depressed affect is congruent.  Thought processes are linear and overall intact.  Thought content is logical.  She denies SI/HI.  She denies AVH and did not appear to be responding to internal stimuli.  Memory and concentration are good.  Language and fund of knowledge are average.  Insight and judgment are on the poor side.  I am unable to comment on physical appearance, eye contact or psychomotor activity as I was unable to physically see the patient.  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  She may benefit from TMS.  I will refer.  You can come  Follow Up Instructions: F/u in 4-6 weeks  or sooner if needed   I discussed the assessment and treatment plan with the patient. The patient was provided an opportunity to ask questions and all were answered. The patient agreed with the plan and demonstrated an understanding of the instructions.   The patient was advised to call back or seek an in-person evaluation if the symptoms worsen or if the condition fails to improve as anticipated.  I provided 30 minutes of non-face-to-face time during this encounter.   Charlcie Cradle, MD

## 2018-10-17 ENCOUNTER — Telehealth: Payer: Self-pay | Admitting: *Deleted

## 2018-10-17 NOTE — Telephone Encounter (Signed)
Left message requesting pt bring [facility] insurance information to the office or fax to [phone].

## 2018-10-17 NOTE — Telephone Encounter (Signed)
-----   Message from Debe Coder sent at 10/17/2018  9:13 AM EDT ----- Regarding: Prior Authorization Please obtain authorization for this patient's scheduled exam:  FHL:[id number]  DOS:10/19/2018 CPT:  73721 x2 SITE:   [address]   Tax ID: [id number]  Thank you, Debe Coder

## 2018-10-19 ENCOUNTER — Other Ambulatory Visit: Payer: Medicare HMO

## 2018-10-19 ENCOUNTER — Ambulatory Visit (HOSPITAL_COMMUNITY): Payer: Medicare HMO | Admitting: Psychiatry

## 2018-10-23 ENCOUNTER — Ambulatory Visit: Payer: Medicare HMO | Admitting: Podiatry

## 2018-10-26 ENCOUNTER — Ambulatory Visit (INDEPENDENT_AMBULATORY_CARE_PROVIDER_SITE_OTHER): Payer: Medicare HMO | Admitting: Psychiatry

## 2018-10-26 ENCOUNTER — Other Ambulatory Visit: Payer: Self-pay

## 2018-10-26 ENCOUNTER — Encounter (HOSPITAL_COMMUNITY): Payer: Self-pay | Admitting: Psychiatry

## 2018-10-26 DIAGNOSIS — F333 Major depressive disorder, recurrent, severe with psychotic symptoms: Secondary | ICD-10-CM

## 2018-10-26 DIAGNOSIS — F99 Mental disorder, not otherwise specified: Secondary | ICD-10-CM | POA: Diagnosis not present

## 2018-10-26 DIAGNOSIS — F5105 Insomnia due to other mental disorder: Secondary | ICD-10-CM | POA: Diagnosis not present

## 2018-10-26 MED ORDER — LAMOTRIGINE 25 MG PO TABS: ORAL_TABLET | ORAL | 1 refills | Status: DC

## 2018-10-26 MED ORDER — DOXEPIN HCL 10 MG/ML PO CONC: ORAL | 0 refills | Status: DC

## 2018-10-26 NOTE — Progress Notes (Signed)
Psychiatric Initial Adult Assessment   Patient Identification: [name] MRN:  [id number] Date of Evaluation:  10/26/2018 Referral Source: By Psychiatrist Chief Complaint:   Chief Complaint    Depression     Visit Diagnosis:    ICD-10-CM   1. Severe episode of recurrent major depressive disorder, with psychotic features (HCC) F33.3 lamoTRIgine (LAMICTAL) 25 MG tablet  2. Insomnia due to other mental disorder F51.05 doxepin (SINEQUAN) 10 MG/ML solution   F99     History of Present Illness: Patient is a 53 year old female referred by her psychiatrist for an evaluation for TMS.  Patient reports that she has been struggling on and off for with depression for years, has been on disability for depression for the past 2 years.  Patient adds that she had an episode last week, where she had increased energy, decreased need for sleep, increased mood irritability and states that it lasted for 4 days.  Patient adds that she has been diagnosed with bipolar disorder in the past, has episodes every few months where she has periods of decreased sleep, increased mood irritability, increased energy.  Patient states that there is also family history of bipolar disorder. Associated Signs/Symptoms: Depression Symptoms:  depressed mood, anhedonia, insomnia, psychomotor agitation, fatigue, feelings of worthlessness/guilt, difficulty concentrating, hopelessness, recurrent thoughts of death, anxiety, (Hypo) Manic Symptoms:  Distractibility, Irritable Mood, Anxiety Symptoms:  Excessive Worry, Obsessive Compulsive Symptoms:   None,, Psychotic Symptoms:  Hallucinations: None PTSD Symptoms: Had a traumatic exposure:  89 or 53 years of age Re-experiencing:  Flashbacks  Past Psychiatric History: last hospitalized in 2010.  Patient reports that she has been seeing Dr. Michae KavaAgarwal for 2 years, has of also [facility]  Previous Psychotropic Medications: Yes    Substance Abuse History in the last 12 months:  No.  Consequences of Substance Abuse: Negative  Past Medical History:  Past Medical History:  Diagnosis Date  . Anxiety   . Arthritis 09/2018   both feet  . Asthma   . Depression   . Environmental allergies   . Fibromyalgia   . GERD (gastroesophageal reflux disease)   . Grave's disease   . Headache    migraines  . High cholesterol   . Hypertension   . Sciatica   . Syncope and collapse   . Vasovagal syncope   . Vertigo     Past Surgical History:  Procedure Laterality Date  . ABDOMINAL HYSTERECTOMY  2006   partial  . GASTRIC BYPASS  2008    Family Psychiatric History: Mother and 2 sisters have depression  Family History:  Family History  Problem Relation Age of Onset  . Hypertension Mother   . Cirrhosis Mother   . Alcohol abuse Mother   . Depression Mother   . Physical abuse Mother   . Hypertension Sister   . Alcohol abuse Sister   . Drug abuse Sister   . Depression Sister   . Anxiety disorder Sister   . OCD Sister   . Hypertension Father   . Alcohol abuse Father   . ADD / ADHD Other   . Depression Sister   . Diabetes Other   . Hypertension Other   . Diabetes Maternal Uncle   . Seizures Cousin     Social History:  single Social History   Socioeconomic History  . Marital status: Divorced    Spouse name: Not on file  . Number of children: Not on file  . Years of education:  Not on file  . Highest education level: Not on file  Occupational History  . Not on file  Social Needs  . Financial resource strain: Not on file  . Food insecurity:    Worry: Not on file    Inability: Not on file  . Transportation needs:    Medical: Not on file    Non-medical: Not on file  Tobacco Use  . Smoking status: Never Smoker  . Smokeless tobacco: Never Used  Substance and Sexual Activity  . Alcohol use: Yes    Alcohol/week: 1.0 standard drinks    Types: 1 Glasses of wine per week    Comment: one glass of wine  every 3 months  . Drug use: No  . Sexual activity: Not Currently    Birth control/protection: Surgical  Lifestyle  . Physical activity:    Days per week: Not on file    Minutes per session: Not on file  . Stress: Not on file  Relationships  . Social connections:    Talks on phone: Not on file    Gets together: Not on file    Attends religious service: Not on file    Active member of club or organization: Not on file    Attends meetings of clubs or organizations: Not on file    Relationship status: Not on file  Other Topics Concern  . Not on file  Social History Narrative   Lives with son   Caffeine use: sometimes per patient    Additional Social History: 2 children, 83 year old daughter, 6 year old son  Allergies:   Allergies  Allergen Reactions  . Bee Venom Anaphylaxis    Other reaction(s): PRURITUS, RASH  . Coconut Fatty Acids Anaphylaxis    Patient allergic to coconut in general  . Mushroom Ext Cmplx(Shiitake-Reishi-Mait) Anaphylaxis  . Nutritional Supplements Anaphylaxis and Itching    walnuts  . Other Anaphylaxis, Hives and Itching    Ragweed  . Shellfish Allergy Anaphylaxis  . Strawberry Extract Anaphylaxis  . Hydrocodone-Acetaminophen Itching    Metabolic Disorder Labs: No results found for: HGBA1C, MPG No results found for: PROLACTIN No results found for: CHOL, TRIG, HDL, CHOLHDL, VLDL, LDLCALC Lab Results  Component Value Date   TSH 1.398 05/05/2011    Therapeutic Level Labs: No results found for: LITHIUM No results found for: CBMZ Lab Results  Component Value Date   VALPROATE 88.0 05/09/2009    Current Medications: Current Outpatient Medications  Medication Sig Dispense Refill  . albuterol (PROVENTIL HFA;VENTOLIN HFA) 108 (90 BASE) MCG/ACT inhaler Inhale 2 puffs into the lungs every 6 (six) hours as needed for wheezing.    Marland Kitchen amoxicillin (AMOXIL) 875 MG tablet TAKE 1 TABLET BY MOUTH TWICE DAILY FOR 5 DAYS    . azelastine (ASTELIN) 137  MCG/SPRAY nasal spray Place 1 spray into the nose 2 (two) times daily. Use in each nostril as directed    . benzonatate (TESSALON) 100 MG capsule TAKE 1 CAPSULE BY MOUTH THREE TIMES DAILY AS NEEDED FOR UP TO 7 DAYS    . BIOTIN 5000 PO Take 5,000 Units by mouth daily.     . budesonide-formoterol (SYMBICORT) 80-4.5 MCG/ACT inhaler Inhale 2 puffs into the lungs 2 (two) times daily.    . cetirizine (ZYRTEC) 10 MG tablet Take 10 mg by mouth daily.    . cholecalciferol (VITAMIN D) 1000 UNITS tablet Take 1,000 Units by mouth daily.     . clonazePAM (KLONOPIN) 0.5 MG tablet Take 1 tablet (0.5  mg total) by mouth 3 (three) times daily as needed. for anxiety 90 tablet 1  . cyclobenzaprine (FLEXERIL) 10 MG tablet Take 10 mg by mouth 2 (two) times daily as needed. msucle spasms  2  . diclofenac sodium (VOLTAREN) 1 % GEL Apply 2 g topically 4 (four) times daily - after meals and at bedtime.  1  . diphenhydrAMINE HCl, Sleep, 50 MG CAPS Take 150 mg by mouth at bedtime.    Marland Kitchen. doxepin (SINEQUAN) 10 MG/ML solution Take 0.5 mLs (5 mg total) by mouth at bedtime as needed for sleep. 120 mL 0  . Epinastine HCl (ELESTAT) 0.05 % ophthalmic solution Place 1 drop into both eyes 2 (two) times daily as needed (itching).     . EPINEPHrine (EPIPEN) 0.3 mg/0.3 mL DEVI Inject 0.3 mg into the muscle as needed. For allergic reaction to stinging insects, mushrooms, and shellfish     . Erenumab-aooe (AIMOVIG) 140 MG/ML SOAJ Inject 140 mg into the skin every 30 (thirty) days. 1 pen 11  . estradiol (ESTRACE) 0.5 MG tablet Take 0.5 mg by mouth daily.    . fexofenadine-pseudoephedrine (ALLEGRA-D) 60-120 MG 12 hr tablet Take by mouth.    . fluconazole (DIFLUCAN) 150 MG tablet TAKE ONE TABLET BY MOUTH AS A ONE TIME DOSE    . fluconazole (DIFLUCAN) 200 MG tablet Take 200 mg by mouth daily.  0  . fluticasone (FLONASE) 50 MCG/ACT nasal spray Place 1 spray into both nostrils as needed (allergies/runny nose).     Marland Kitchen. gabapentin (NEURONTIN) 300 MG  capsule Take 300 mg by mouth 2 (two) times daily.    Marland Kitchen. ibuprofen (ADVIL,MOTRIN) 800 MG tablet Take 800 mg by mouth every 8 (eight) hours as needed for moderate pain.    Marland Kitchen. lamoTRIgine (LAMICTAL) 25 MG tablet Take 1 tablet (25 mg total) by mouth every morning for 14 days, THEN 2 tablets (50 mg total) at bedtime for 14 days, THEN 2 tablets (50 mg total) 2 (two) times daily. 60 tablet 1  . meclizine (ANTIVERT) 25 MG tablet Take 1 tablet (25 mg total) by mouth every 6 (six) hours as needed for dizziness. 20 tablet 0  . Menthol-Methyl Salicylate (BEN GAY GREASELESS) 10-15 % greaseless cream Apply 1 application topically 3 (three) times daily as needed for pain.    . methylPREDNISolone (MEDROL DOSEPAK) 4 MG TBPK tablet Take as directed 21 tablet 0  . mometasone-formoterol (DULERA) 100-5 MCG/ACT AERO Inhale 2 puffs into the lungs 2 (two) times daily as needed for wheezing or shortness of breath.     . montelukast (SINGULAIR) 10 MG tablet Take 1 tablet (10 mg total) by mouth at bedtime. 30 tablet 0  . Multiple Vitamins-Minerals (MULTIVITAMIN WITH MINERALS) tablet Take 1 tablet by mouth daily.    . naloxegol oxalate (MOVANTIK) 25 MG TABS tablet Take 25 mg by mouth daily.    . naloxone (NARCAN) nasal spray 4 mg/0.1 mL     . Oxycodone HCl 10 MG TABS Take 10 mg by mouth 2 (two) times daily as needed.    Marland Kitchen. oxyCODONE-acetaminophen (PERCOCET/ROXICET) 5-325 MG per tablet Take one every 6 hours if needed for cough 12 tablet 0  . oxymetazoline (AFRIN NASAL SPRAY) 0.05 % nasal spray Place 1 spray into both nostrils 2 (two) times daily. (Patient taking differently: Place 1 spray into both nostrils 2 (two) times daily as needed for congestion. ) 30 mL 0  . pantoprazole (PROTONIX) 40 MG tablet Take by mouth.    . polyethylene  glycol (MIRALAX / GLYCOLAX) packet Take 17 g by mouth every other day.     . prazosin (MINIPRESS) 1 MG capsule Take 1 capsule (1 mg total) by mouth at bedtime. 30 capsule 1  . ranitidine (ZANTAC) 150  MG tablet Take 150 mg by mouth daily.    Marland Kitchen. RELISTOR 150 MG TABS     . SUMAtriptan (IMITREX) 100 MG tablet Take 1 tablet (100 mg total) by mouth once as needed for up to 1 dose for migraine. May repeat in 2 hours if headache persists or recurs. 9 tablet 12  . triamcinolone cream (KENALOG) 0.1 % Apply topically.    . vitamin B-12 (CYANOCOBALAMIN) 100 MCG tablet Take 100 mcg by mouth daily.     No current facility-administered medications for this visit.     Musculoskeletal: Strength & Muscle Tone: within normal limits Gait & Station: normal Patient leans: N/A  Psychiatric Specialty Exam: Review of Systems  Constitutional: Positive for malaise/fatigue. Negative for fever.  HENT: Negative.  Negative for congestion, hearing loss and sore throat.   Eyes: Negative.  Negative for blurred vision, double vision, photophobia, discharge and redness.  Respiratory: Negative.  Negative for cough, shortness of breath and wheezing.   Cardiovascular: Negative.  Negative for chest pain and palpitations.  Gastrointestinal: Negative.  Negative for abdominal pain, diarrhea, heartburn, nausea and vomiting.  Musculoskeletal: Positive for myalgias. Negative for falls.  Neurological: Positive for dizziness and headaches. Negative for tingling, tremors, seizures, loss of consciousness and weakness.  Endo/Heme/Allergies: Negative for environmental allergies.  Psychiatric/Behavioral: Positive for depression. Negative for hallucinations, substance abuse and suicidal ideas. The patient is not nervous/anxious and does not have insomnia.     Blood pressure 124/81, pulse 94, height 5\' 6"  (1.676 m), weight 264 lb (119.7 kg).Body mass index is 42.61 kg/m.  General Appearance: Casual  Eye Contact:  Fair  Speech:  Clear and Coherent and Normal Rate  Volume:  Normal  Mood:  Depressed  Affect:  Appropriate, Non-Congruent and Full Range  Thought Process:  Coherent, Goal Directed and Descriptions of Associations: Intact   Orientation:  Full (Time, Place, and Person)  Thought Content:  Logical and Rumination  Suicidal Thoughts:  No  Homicidal Thoughts:  No  Memory:  Immediate;   Fair Recent;   Fair Remote;   Fair  Judgement:  Impaired  Insight:  Lacking  Psychomotor Activity:  Normal  Concentration:  Concentration: Fair and Attention Span: Fair  Recall:  [employer]  Fund of Knowledge:Fair  Language: Fair  Akathisia:  No  Handed:  Right  AIMS (if indicated):  not done  Assets:  Desire for Improvement Financial Resources/Insurance Housing Leisure Time Social Support  ADL's:  Impaired  Cognition: WNL  Sleep:  Fair   Screenings: PHQ2-9     Nutrition from 10/28/2015 in [facility] Nutrition from 08/26/2015 in [facility]  PHQ-2 Total Score  3  0      Assessment and Plan: Bipolar disorder, type II  Discussed with patient to restart Lamictal 25 mg 1 tablet daily for 1 week and then increase to taking 1 twice a day.  The risks and benefits were discussed again with patient in length at this visit. To continue doxepin 10 mg/mL, take 5 to 10 mg at bedtime for insomnia. PTSD To continue prazosin 1 mg at bedtime for nightmares Continue Klonopin 0.5 mg 3 times daily as needed anxiety Cluster B traits Continue to see therapist to help address it Patient  is not a candidate for TMS as she reports last week having an episode which lasted 4 days with increased energy, decreased need for sleep, increased mood irritability.  Patient has not been taking her medications as prescribed, discussed the need for medication compliance and to start back Lamictal for mood stabilization.  Patient does report that Abilify helped her in the past but she had weight gain with it.  Refer patient back to her psychiatrist for follow-up   Nelly Rout, MD 6/4/20202:01 PM

## 2018-10-26 NOTE — Patient Instructions (Signed)
Restart Lamictal at 25 mg one  daily for one week and then increase to 25 Mg twice daily, that is one pill twice a day

## 2018-10-30 ENCOUNTER — Other Ambulatory Visit: Payer: Medicare HMO

## 2018-10-30 NOTE — Telephone Encounter (Signed)
-----   Message from Sandre Kitty sent at 10/27/2018  3:01 PM EDT ----- Regarding: Prior Authorization Good afternoon!   This patient is scheduled for bilat ankle MRIs Monday evening.   Only the left ankle has been authorized through [employer]  Would you please make sure the other ankle gets authorized?    Thank you!    [name]

## 2018-11-02 ENCOUNTER — Telehealth: Payer: Self-pay | Admitting: *Deleted

## 2018-11-02 NOTE — Telephone Encounter (Signed)
[place] states criteria has been met and the PA# [id number], valid 11/02/2018 to 12/02/2018.

## 2018-11-02 NOTE — Telephone Encounter (Signed)
I called the pt to confirm she is planning to come into office for Tues 6/16 appt @ 2:30 PM. LVM (message being transcribed) and asked pt to call us back to discuss the appt process and go over [place] screening questions. I advised pt she will need to bring her own mask to appt. Left office number and hours in message. Advised if we do not receive a call back by Monday, the appt will be canceled and pt will need to r/s.

## 2018-11-02 NOTE — Telephone Encounter (Signed)
-----   Message from Jennifer Bryant sent at 10/27/2018  3:01 PM EDT ----- Regarding: Prior Authorization Good afternoon!   This patient is scheduled for bilat ankle MRIs Monday evening.   Only the left ankle has been authorized through [employer].  Would you please make sure the other ankle gets authorized?    Thank you!    Jennifer   

## 2018-11-02 NOTE — Telephone Encounter (Signed)
[place] CRITERIA HAS BEEN MET FOR THE RIGHT MRI [id number] ANKLE WO CONTRAST, PRIOR AUTHORIZATION:  [id number], VALID 11/02/2018 TO 12/02/2018.

## 2018-11-02 NOTE — Telephone Encounter (Signed)
t returned call, covid screening , done , no symptoms, pt is advised to bring own mask

## 2018-11-06 ENCOUNTER — Encounter: Payer: Self-pay | Admitting: *Deleted

## 2018-11-07 ENCOUNTER — Encounter: Payer: Self-pay | Admitting: Neurology

## 2018-11-07 ENCOUNTER — Ambulatory Visit (INDEPENDENT_AMBULATORY_CARE_PROVIDER_SITE_OTHER): Payer: Medicare HMO | Admitting: Neurology

## 2018-11-07 ENCOUNTER — Other Ambulatory Visit: Payer: Self-pay

## 2018-11-07 VITALS — BP 124/84 | HR 90 | Temp 97.3°F | Ht 66.0 in | Wt 266.0 lb

## 2018-11-07 DIAGNOSIS — R5383 Other fatigue: Secondary | ICD-10-CM

## 2018-11-07 DIAGNOSIS — G43711 Chronic migraine without aura, intractable, with status migrainosus: Secondary | ICD-10-CM

## 2018-11-07 DIAGNOSIS — R413 Other amnesia: Secondary | ICD-10-CM | POA: Diagnosis not present

## 2018-11-07 DIAGNOSIS — F333 Major depressive disorder, recurrent, severe with psychotic symptoms: Secondary | ICD-10-CM

## 2018-11-07 MED ORDER — KETOROLAC TROMETHAMINE 60 MG/2ML IM SOLN
60.0000 mg | Freq: Once | INTRAMUSCULAR | Status: AC
  Administered 2018-11-07: 60 mg via INTRAMUSCULAR

## 2018-11-07 NOTE — Progress Notes (Signed)
[facility]    Provider:  Dr Jaynee Eagles Referring Provider: Berkley Harvey, NP Primary Care Physician:  Berkley Harvey, NP  CC:  Memory loss  HP 11/07/2018: This is a 53 year old female here as a new request from Eldridge Abrahams, NP for memory changes.  I have seen her in the past for migraines and treated her with Botox, medications and suggested the new CGRP medications.  She has a past medical history of hypertension, high cholesterol, chronic pain, migraine, depression, anxiety, fibromyalgia, vertigo, syncope, sciatica, bipolar, morbid obesity, severe depressed bipolar 2 disorder. Her short term memory is impaired, long term as well. Going on years and getting worse. She forgets where she is going. She has to put address in to remember how to get to doctor. She can;t remember what she ate for dinner. She has all bills get paid by patient, no accidents in the home, she has recurrent syncope.   Migraines; Started Aimovig, she will be having her 2nd shot and feels improved. Second shot was yesterday. Ongoing migraine for 3 days. Try Toradol today.   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  The overall AHI is in the normal range.  CPAP therapy was not warranted.  Weight loss was recommended.  I reviewed the sleep test data and agree with the above.  I personally reviewed MRI of the brain March 21, 2018 which showed some nonspecific white matter changes overall normal for age.  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  HPI 01/13/2016:  DEDRIA ENDRES is a 53 y.o. female here as a referral from Dr. Ronnald Ramp for  migraines. Past medical history of hypertension, high cholesterol, chronic pain, migraine, depression, anxiety, fibromyalgia, vertigo syncope, sciatica, bipolar, morbid obesity, severe depressed bipolar 2 disorder. She has migraines daily, nothing makes them better. She had migraines for 19 days straight in July. Thought it was a sinus infection. She was placed on Topiramate many years ago for migraines and she continues to take it for 13-14s year, no inciting events or head trauma in the past, no family history of migraines. She is taking 100 mg in the morning and 100 mg at night of Topamax.  Migraines are pressure on the left, pounding, throbbing, light sensitivity, sound sensitivity, every day. She has to wear her shades inside the house which makes her feel better. She takes oxycodone prescribed for her sciatica and joint pain every 3-4 days for the migraines. Discussed rebound headaches. No aura. No vision changes, no dysarthria, no dysphasia, no meningismus. These are the same migraines that she's been having for many years, no change in severity or frequency. Stress makes them worse and she has significant depression. Smells trigger her migraines. Migraines can be severe.  Reviewed notes, labs and imaging from outside physicians, which showed:   Reviewed images of MRI brain personally on CD 12/25/2015 and agree with the following: FINDINGS: No evidence for acute infarction, hemorrhage, mass lesion, hydrocephalus, or extra-axial fluid. Normal for age cerebral volume. Minor subcortical white matter disease on the LEFT, 3-4 foci of T2 and FLAIR hyperintensity, subcentimeter size frontal region. No similar findings on the RIGHT or in the periventricular white matter.  Flow voids are maintained. Artifactual gradient sequence, but no definite foci of chronic hemorrhage. The extracranial soft tissues are unremarkable.  CBC and BMP were normal in April 2017 including creatinine of 0.81.  Review of Systems:  Patient complains of symptoms per HPI as well as the following symptoms: Weight gain, fatigue, blurred vision, chest pain, palpitations, shortness of breath, wheezing, feeling hot, feeling cold, increased thirst, flushing, joint pain, cramps, aching muscles, allergies, runny nose, skin sensitivity, frequent infections, memory loss, confusion, headache, numbness, weakness, insomnia, sleepiness, restless, dizziness, passing out, depression, anxiety, not enough sleep, decreased energy, change in appetite, disinterest in activities, suicidal thoughts, racing thoughts. Pertinent negatives per HPI. All others negative.   Social History   Socioeconomic History  . Marital status: Divorced    Spouse name: Not on file  . Number of children: 2  . Years of education: 33  . Highest education level: Not on file  Occupational History  . Not on file  Social Needs  . Financial resource strain: Not on file  . Food insecurity    Worry: Not on file    Inability: Not on file  . Transportation needs    Medical: Not on file    Non-medical: Not on file  Tobacco Use  . Smoking status: Never Smoker  . Smokeless tobacco: Never Used  Substance and Sexual Activity  . Alcohol use: Yes    Alcohol/week: 1.0 standard drinks    Types: 1 Glasses of wine per week    Comment: one glass of wine every 3 months  . Drug use: No  . Sexual activity: Not Currently    Birth control/protection: Surgical  Lifestyle  . Physical activity    Days per week: Not on file    Minutes per session: Not on file  . Stress: Not on file  Relationships  . Social Musician on phone: Not on file    Gets together: Not on file    Attends religious service: Not on file    Active member of club or organization: Not on file    Attends meetings of clubs or organizations: Not on file    Relationship status: Not on file  . Intimate partner violence    Fear of current or ex partner: Not on file    Emotionally abused: Not on file     Physically abused: Not on file    Forced sexual activity: Not on file  Other Topics Concern  . Not on file  Social History Narrative   Lives with son   Caffeine use: sometimes per patient    Family History  Problem Relation Age of Onset  . Hypertension Mother   . Cirrhosis Mother   . Alcohol abuse Mother   . Depression Mother   . Physical abuse Mother   . Hypertension Sister   . Alcohol abuse Sister   . Drug abuse Sister   . Depression Sister   . Anxiety disorder Sister   . OCD Sister   . Hypertension Father   . Alcohol abuse Father   . ADD / ADHD Other   . Depression Sister   . Diabetes Other   . Hypertension Other   . Diabetes Maternal Uncle   . Seizures Cousin   . Dementia Neg Hx     Past Medical History:  Diagnosis Date  . Allergic rhinitis   . Allergic to cats    pet dander  . Anxiety   . Arthritis 09/2018  both feet  . Asthma   . Cervicalgia   . Depression   . Environmental allergies   . Fibromyalgia   . GERD (gastroesophageal reflux disease)   . Grave's disease   . Headache    migraines  . High cholesterol   . Hypertension   . Panic disorder   . Risk for falls   . Sciatica   . 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 Syncope and collapse   . Thyroid disease   . Vasovagal syncope   . Vertigo     Past Surgical History:  Procedure Laterality Date  . ABDOMINAL HYSTERECTOMY  2006   partial  . GASTRIC BYPASS  2008    Current Outpatient Medications  Medication Sig Dispense Refill  . albuterol (PROVENTIL HFA;VENTOLIN HFA) 108 (90 BASE) MCG/ACT inhaler Inhale 2 puffs into the lungs every 6 (six) hours as needed for wheezing.    Marland Kitchen. azelastine (ASTELIN) 137 MCG/SPRAY nasal spray Place 1 spray into the nose 2 (two) times daily. Use in each nostril as directed    . BIOTIN 5000 PO Take 5,000 Units by mouth daily.     . budesonide-formoterol (SYMBICORT) 80-4.5 MCG/ACT inhaler Inhale 2 puffs into the lungs 2 (two)  times daily.    . cetirizine (ZYRTEC) 10 MG tablet Take 10 mg by mouth daily.    . clonazePAM (KLONOPIN) 0.5 MG tablet Take 1 tablet (0.5 mg total) by mouth 3 (three) times daily as needed. for anxiety 90 tablet 1  . cyclobenzaprine (FLEXERIL) 10 MG tablet Take 10 mg by mouth 2 (two) times daily as needed. msucle spasms  2  . diclofenac sodium (VOLTAREN) 1 % GEL Apply 2 g topically 4 (four) times daily - after meals and at bedtime.  1  . diphenhydrAMINE HCl, Sleep, 50 MG CAPS Take 150 mg by mouth at bedtime.    Marland Kitchen. doxepin (SINEQUAN) 10 MG/ML solution Take 5 mg or 10 mg at night to help with sleep, that is 0.5 ml or max of 1 ml 120 mL 0  . Epinastine HCl (ELESTAT) 0.05 % ophthalmic solution Place 1 drop into both eyes 2 (two) times daily as needed (itching).     Dorise Hiss. Erenumab-aooe (AIMOVIG) 140 MG/ML SOAJ Inject 140 mg into the skin every 30 (thirty) days. 1 pen 11  . estradiol (ESTRACE) 0.5 MG tablet Take 0.5 mg by mouth daily.    . fluticasone (FLONASE) 50 MCG/ACT nasal spray Place 1 spray into both nostrils as needed (allergies/runny nose).     Marland Kitchen. gabapentin (NEURONTIN) 300 MG capsule Take 600 mg by mouth 2 (two) times daily.     Marland Kitchen. ibuprofen (ADVIL,MOTRIN) 800 MG tablet Take 800 mg by mouth every 8 (eight) hours as needed for moderate pain.    Marland Kitchen. lamoTRIgine (LAMICTAL) 25 MG tablet Take one daily for one week and then increase to one twice a day 60 tablet 1  . mometasone-formoterol (DULERA) 100-5 MCG/ACT AERO Inhale 2 puffs into the lungs 2 (two) times daily as needed for wheezing or shortness of breath.     . montelukast (SINGULAIR) 10 MG tablet Take 1 tablet (10 mg total) by mouth at bedtime. 30 tablet 0  . Multiple Vitamins-Minerals (MULTIVITAMIN WITH MINERALS) tablet Take 1 tablet by mouth daily.    . naloxegol oxalate (MOVANTIK) 25 MG TABS tablet Take 25 mg by mouth daily.     . Oxycodone HCl 10 MG TABS Take 10 mg by mouth 2 (two) times  daily as needed.    Marland Kitchen oxymetazoline (AFRIN NASAL SPRAY) 0.05  % nasal spray Place 1 spray into both nostrils 2 (two) times daily. (Patient taking differently: Place 1 spray into both nostrils 2 (two) times daily as needed for congestion. ) 30 mL 0  . pantoprazole (PROTONIX) 40 MG tablet Take by mouth.    . polyethylene glycol (MIRALAX / GLYCOLAX) packet Take 17 g by mouth every other day.     . prazosin (MINIPRESS) 1 MG capsule Take 1 capsule (1 mg total) by mouth at bedtime. 30 capsule 1  . RELISTOR 150 MG TABS     . triamcinolone cream (KENALOG) 0.1 % Apply topically.    . vitamin B-12 (CYANOCOBALAMIN) 100 MCG tablet Take 100 mcg by mouth daily.    Marland Kitchen EPINEPHrine (EPIPEN) 0.3 mg/0.3 mL DEVI Inject 0.3 mg into the muscle as needed. For allergic reaction to stinging insects, mushrooms, and shellfish     . fexofenadine-pseudoephedrine (ALLEGRA-D) 60-120 MG 12 hr tablet Take by mouth.    . fluconazole (DIFLUCAN) 150 MG tablet TAKE ONE TABLET BY MOUTH AS A ONE TIME DOSE    . Menthol-Methyl Salicylate (BEN GAY GREASELESS) 10-15 % greaseless cream Apply 1 application topically 3 (three) times daily as needed for pain.    . naloxone (NARCAN) nasal spray 4 mg/0.1 mL     . ranitidine (ZANTAC) 150 MG tablet Take 150 mg by mouth daily.    . SUMAtriptan (IMITREX) 100 MG tablet Take 1 tablet (100 mg total) by mouth once as needed for up to 1 dose for migraine. May repeat in 2 hours if headache persists or recurs. (Patient not taking: Reported on 11/07/2018) 9 tablet 12  . Vitamin D, Ergocalciferol, (DRISDOL) 1.25 MG (50000 UT) CAPS capsule Take 1 capsule (50,000 Units total) by mouth every 7 (seven) days. 5 capsule 1   No current facility-administered medications for this visit.     Allergies as of 11/07/2018 - Review Complete 11/07/2018  Allergen Reaction Noted  . Bee venom Anaphylaxis 10/10/2014  . Coconut fatty acids Anaphylaxis 04/27/2016  . Mushroom ext cmplx(shiitake-reishi-mait) Anaphylaxis 05/05/2011  . Nutritional supplements Anaphylaxis and Itching  05/05/2011  . Other Anaphylaxis, Hives, and Itching 02/13/2015  . Shellfish allergy Anaphylaxis 05/05/2011  . Strawberry extract Anaphylaxis 02/13/2015  . Hydrocodone-acetaminophen Itching 08/13/2008    Vitals: BP 124/84 (BP Location: Right Arm, Patient Position: Sitting)   Pulse 90   Temp (!) 97.3 F (36.3 C) Comment: client's temp is 97.5. both taken by staff upon arrival  Ht  (1.676 m)   Wt 266 lb (120.7 kg)   BMI 42.93 kg/m  Last Weight:  Wt Readings from Last 1 Encounters:  11/07/18 266 lb (120.7 kg)   Last Height:   Ht Readings from Last 1 Encounters:  11/07/18  (1.676 m)   Physical exam: Exam: Gen: NAD, conversant, well nourised, obese, sitting in the office with sunglasses and a face mask on.        CV: RRR, no MRG. No Carotid Bruits. No peripheral edema, warm, nontender Eyes: Conjunctivae clear without exudates or hemorrhage  Neuro: Detailed Neurologic Exam  Speech:    Speech is normal; fluent and spontaneous with normal comprehension.  Cognition:    The patient is oriented to person, place, and time;     recent and remote memory intact;     language fluent;     normal attention, concentration,     fund of knowledge Cranial Nerves:  The pupils are equal, round, and reactive to light. The fundi are normal and spontaneous venous pulsations are present. Visual fields are full to finger confrontation. Extraocular movements are intact. Trigeminal sensation is intact and the muscles of mastication are normal. The face is symmetric. The palate elevates in the midline. Hearing intact. Voice is normal. Shoulder shrug is normal. The tongue has normal motion without fasciculations.   Coordination:    Normal finger to nose and heel to shin. Normal rapid alternating movements.   Gait:    Heel-toe and tandem gait are normal.   Motor Observation:    No asymmetry, no atrophy, and no involuntary movements noted. Tone:    Normal muscle tone.    Posture:     Posture is normal. normal erect    Strength:    Strength is V/V in the upper and lower limbs.      Sensation: intact to LT     Reflex Exam:  DTR's:    Deep tendon reflexes in the upper and lower extremities are normal bilaterally.   Toes:    The toes are downgoing bilaterally.   Clonus:    Clonus is absent.       Assessment/Plan:  This is a 53 year old female here as a new request from Zoe Lan, NP for memory loss.  I have seen her in the past for migraines and treated her with Botox, medications and suggested the new CGRP medications.  She has a past medical history of hypertension, high cholesterol, chronic pain, migraine, depression, anxiety, fibromyalgia, vertigo, syncope, sciatica, bipolar, morbid obesity, severe depressed bipolar 2 disorder.  I personally reviewed MRI of the brain March 21, 2018 which showed some nonspecific white matter changes overall normal for age.  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  I do not appreciate any neurodegenerative brain disorder to explain her memory loss.  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 Discussed each item, recommend continuing therapy, healthy weight and wellness center would be good. Will check a few labs.  Orders Placed This Encounter  Procedures  . TSH  . Vitamin D, 25-hydroxy  . Ambulatory referral to Family Practice     Migraines: Continue Aimovig. F/u 4 months.  Discussed: To prevent or relieve headaches, try the following: Cool Compress. Lie down and place a cool compress on your head.  Avoid headache triggers. If certain foods or odors seem to have triggered your migraines in the past, avoid them. A headache  diary might help you identify triggers.  Include physical activity in your daily routine. Try a daily walk or other moderate aerobic exercise.  Manage stress. Find healthy ways to cope with the stressors, such as delegating tasks on your to-do list.  Practice relaxation techniques. Try deep breathing, yoga, massage and visualization.  Eat regularly. Eating regularly scheduled meals and maintaining a healthy diet might help prevent headaches. Also, drink plenty of fluids.  Follow a regular sleep schedule. Sleep deprivation might contribute to headaches Consider biofeedback. With this mind-body technique, you learn to control certain bodily functions - such as muscle tension, heart rate and blood pressure - to prevent headaches or reduce headache pain.    Proceed to emergency room if you experience new or worsening symptoms or symptoms do not resolve, if you have new  neurologic symptoms or if headache is severe, or for any concerning symptom.   Provided education and documentation from American headache Society toolbox including articles on: chronic migraine medication overuse headache, chronic migraines, prevention of migraines, behavioral and other nonpharmacologic treatments for headache.   Cc Zoe LanPenny Jones  A total of 40minutes was spent face-to-face with this patient. Over half this time was spent on counseling patient on the  1. Memory loss   2. Chronic migraine without aura, with intractable migraine, so stated, with status migrainosus   3. Morbid obesity (HCC)   4. Fatigue, unspecified type   5. Severe episode of recurrent major depressive disorder, with psychotic features (HCC)    diagnosis and different diagnostic and therapeutic options, counseling and coordination of care, risks ans benefits of management, compliance, or risk factor reduction and education.     Naomie DeanAntonia Ahern, MD  [facility] [address] [place], [place] [id number]  Phone  [phone] Fax [phone]

## 2018-11-07 NOTE — Patient Instructions (Addendum)
  Continue Aimovig for migraines [facility] [place] - they will call you Bloodwork Follow up 3 months Toradol shot  Memory Compensation Strategies  1. Use "WARM" strategy.  W= write it down  A= associate it  R= repeat it  M= make a mental note  2.   You can keep a Social worker.  Use a 3-ring notebook with sections for the following: calendar, important names and phone numbers,  medications, doctors' names/phone numbers, lists/reminders, and a section to journal what you did  each day.   3.    Use a calendar to write appointments down.  4.    Write yourself a schedule for the day.  This can be placed on the calendar or in a separate section of the Memory Notebook.  Keeping a  regular schedule can help memory.  5.    Use medication organizer with sections for each day or morning/evening pills.  You may need help loading it  6.    Keep a basket, or pegboard by the door.  Place items that you need to take out with you in the basket or on the pegboard.  You may also want to  include a message board for reminders.  7.    Use sticky notes.  Place sticky notes with reminders in a place where the task is performed.  For example: " turn off the  stove" placed by the stove, "lock the door" placed on the door at eye level, " take your medications" on  the bathroom mirror or by the place where you normally take your medications.  8.    Use alarms/timers.  Use while cooking to remind yourself to check on food or as a reminder to take your medicine, or as a  reminder to make a call, or as a reminder to perform another task, etc.

## 2018-11-07 NOTE — Progress Notes (Signed)
Pt was given Toradol 60 mg IM injection per v.o. Dr. Jaynee Eagles. See MAR. Pt tolerated well. She stated she has had Toradol in the past and declined need to stay for monitoring.

## 2018-11-08 ENCOUNTER — Other Ambulatory Visit: Payer: Self-pay | Admitting: Neurology

## 2018-11-08 DIAGNOSIS — E559 Vitamin D deficiency, unspecified: Secondary | ICD-10-CM

## 2018-11-08 LAB — TSH: TSH: 0.93 u[IU]/mL (ref 0.450–4.500)

## 2018-11-08 LAB — VITAMIN D 25 HYDROXY (VIT D DEFICIENCY, FRACTURES): Vit D, 25-Hydroxy: 14.8 ng/mL — ABNORMAL LOW (ref 30.0–100.0)

## 2018-11-08 MED ORDER — VITAMIN D (ERGOCALCIFEROL) 1.25 MG (50000 UNIT) PO CAPS: 50000.0000 [IU] | ORAL_CAPSULE | ORAL | 1 refills | Status: DC

## 2018-11-09 ENCOUNTER — Ambulatory Visit (INDEPENDENT_AMBULATORY_CARE_PROVIDER_SITE_OTHER): Payer: Medicare HMO | Admitting: Psychiatry

## 2018-11-09 ENCOUNTER — Encounter (HOSPITAL_COMMUNITY): Payer: Self-pay | Admitting: Psychiatry

## 2018-11-09 ENCOUNTER — Other Ambulatory Visit: Payer: Self-pay

## 2018-11-09 DIAGNOSIS — F5105 Insomnia due to other mental disorder: Secondary | ICD-10-CM | POA: Diagnosis not present

## 2018-11-09 DIAGNOSIS — F4001 Agoraphobia with panic disorder: Secondary | ICD-10-CM

## 2018-11-09 DIAGNOSIS — F333 Major depressive disorder, recurrent, severe with psychotic symptoms: Secondary | ICD-10-CM

## 2018-11-09 DIAGNOSIS — F411 Generalized anxiety disorder: Secondary | ICD-10-CM

## 2018-11-09 DIAGNOSIS — F99 Mental disorder, not otherwise specified: Secondary | ICD-10-CM

## 2018-11-09 MED ORDER — LAMOTRIGINE 25 MG PO TABS: 50.0000 mg | ORAL_TABLET | Freq: Two times a day (BID) | ORAL | 1 refills | Status: DC

## 2018-11-09 MED ORDER — PRAZOSIN HCL 1 MG PO CAPS: 1.0000 mg | ORAL_CAPSULE | Freq: Every day | ORAL | 1 refills | Status: DC

## 2018-11-09 MED ORDER — CLONAZEPAM 0.5 MG PO TABS: 0.5000 mg | ORAL_TABLET | Freq: Three times a day (TID) | ORAL | 1 refills | Status: DC | PRN

## 2018-11-09 NOTE — Progress Notes (Signed)
Virtual Visit via Telephone Note  I connected with [name] on 11/09/18 at  4:15 PM EDT by telephone and verified that I am speaking with the correct person using two identifiers.  Location: Patient: Home Provider: Office   I discussed the limitations, risks, security and privacy concerns of performing an evaluation and management service by telephone and the availability of in person appointments. I also discussed with the patient that there may be a patient responsible charge related to this service. The patient expressed understanding and agreed to proceed.   History of Present Illness: This is her 3rd day of no sleep and migraines. In the little time she does fall asleep she has nightmares. The pharmacist gave her the wrong syringe and so pt took 5x her prescribed dose of Doxepin. She finished the bottle in 6 days and was sleeping well. She went to pharmacist to refill and was told she was given the wrong syringe. After taking 37ml as prescribed she was only getting 3-4 hrs/night.  She has restarted Lamictal and it helps with depression but thinks it is causing nightmares. She is not as irritable and has calmed her down some. Her appetite is improved. The Klonopin helps if she takes 2 tabs with Doxepin to help her sleep. She is no longer having SI. Pt states she is having "little things like something going across the floor" every other day. Pt hears noises like someone is talking. Panic attacks are better since she is not going out. Pt denies HI.    Observations/Objective: I spoke with [name] on the phone.  Pt was calm, pleasant and cooperative.  Pt was engaged in the conversation and answered questions appropriately.  Speech was clear and coherent with normal rate, tone and volume.  Mood is depressed and anxious, affect is congruent. Thought processes are coherent and intact.  Thought content is with ruminations on Ambien.  Pt denies SI/HI.   Pt reports auditory and visual  hallucinations and did not appear to be responding to internal stimuli.  Memory and concentration are good.  Fund of knowledge and use of language are average.  Insight and judgment are fair.  I am unable to comment on psychomotor activity, general appearance, hygiene, or eye contact as I was unable to physically see the patient on the phone.   Assessment and Plan: MDD-recurrent, severe with psychotic features versus bipolar 2 disorder; insomnia; GAD; panic disorder with agoraphobia; social anxiety disorder; cluster B traits  Lamictal 50mg  po BID Klonopin 0.5mg  po TID prn anxiety Prazosin 1mg  po qHS prn nightmares D/c Doxepin.   I declined patient's request for Ambien due to the large number of sedating medication she is taking.  I am also concerned that she reported a history of sleepwalking while taking Ambien. At this point in time I am not comfortable prescribing any sedative/hypnotic agents.  Patient reports chronic problems with sleep and is currently taking many medications with sedative effects. Pt went to sleep clinic last year and reports no recommendations were made. I recommend she contact the sleep clinic again.   Follow Up Instructions: In 2 months or sooner if needed   I discussed the assessment and treatment plan with the patient. The patient was provided an opportunity to ask questions and all were answered. The patient agreed with the plan and demonstrated an understanding of the instructions.   The patient was advised to call back or seek an in-person evaluation if the symptoms worsen or if the condition  fails to improve as anticipated.  I provided 20 minutes minutes of non-face-to-face time during this encounter.   Charlcie Cradle, MD

## 2018-11-14 ENCOUNTER — Encounter: Payer: Self-pay | Admitting: *Deleted

## 2018-11-14 ENCOUNTER — Telehealth: Payer: Self-pay | Admitting: *Deleted

## 2018-11-14 NOTE — Telephone Encounter (Signed)
-----   Message from Melvenia Beam, MD sent at 11/08/2018 10:53 AM EDT ----- Vitamin D is extremely low. I can send in a prescription for weekly Vitamin D but when that is done, you should make sure to take daily Vitamin D supplementation as we discussed in the appointment. Thanks.

## 2018-11-14 NOTE — Telephone Encounter (Signed)
The patient returned my call. Lab results discussed. The pt stated she had already picked up the prescription and took two capsules two days in a row and didn't realize it was supposed to be one per week. Pt states she is tired but otherwise no adverse effects reported. I discussed the instructions with the patient. She understands that going forward she is to take 1 capsule once a week on the same day for total of 10 weeks. Afterward start a daily vitamin D supplement. She was advised of a goodrx coupon for the next refill. I offered to send to her in [place]. She verbalized appreciation.

## 2018-11-14 NOTE — Telephone Encounter (Signed)
Called pt & LVM on a transcribed VM asking for call back as soon as possible. Left office number in message.

## 2018-11-15 ENCOUNTER — Other Ambulatory Visit (HOSPITAL_COMMUNITY): Payer: Self-pay | Admitting: Psychiatry

## 2018-11-15 DIAGNOSIS — F5105 Insomnia due to other mental disorder: Secondary | ICD-10-CM

## 2018-11-23 ENCOUNTER — Ambulatory Visit
Admission: RE | Admit: 2018-11-23 | Discharge: 2018-11-23 | Disposition: A | Discharge status: Home / Self Care | Payer: Medicare HMO | Source: Ambulatory Visit | Attending: Podiatry | Admitting: Podiatry

## 2018-11-23 ENCOUNTER — Other Ambulatory Visit: Payer: Self-pay

## 2018-11-23 DIAGNOSIS — M722 Plantar fascial fibromatosis: Secondary | ICD-10-CM

## 2018-11-23 DIAGNOSIS — M792 Neuralgia and neuritis, unspecified: Secondary | ICD-10-CM

## 2018-11-23 DIAGNOSIS — M779 Enthesopathy, unspecified: Secondary | ICD-10-CM

## 2018-11-28 ENCOUNTER — Telehealth (HOSPITAL_COMMUNITY): Payer: Self-pay

## 2018-11-28 ENCOUNTER — Other Ambulatory Visit (HOSPITAL_COMMUNITY): Payer: Self-pay | Admitting: Psychiatry

## 2018-11-28 DIAGNOSIS — F5105 Insomnia due to other mental disorder: Secondary | ICD-10-CM

## 2018-11-28 NOTE — Telephone Encounter (Signed)
Patient is calling today for a refill on doxepin (liquid) patient states that the prazosin is not working. Please review and advise, thank you

## 2018-11-30 NOTE — Telephone Encounter (Signed)
No I discontinued it.  I recommended she follow-up with the sleep clinic for any further medications regarding her sleep

## 2018-12-01 ENCOUNTER — Telehealth: Payer: Self-pay | Admitting: *Deleted

## 2018-12-01 NOTE — Telephone Encounter (Signed)
Called and left a message for the patient and to call me back at [phone]. [name]

## 2018-12-06 ENCOUNTER — Other Ambulatory Visit (HOSPITAL_COMMUNITY): Payer: Self-pay

## 2018-12-06 DIAGNOSIS — F5105 Insomnia due to other mental disorder: Secondary | ICD-10-CM

## 2018-12-06 MED ORDER — DOXEPIN HCL 10 MG/ML PO CONC: ORAL | 0 refills | Status: DC

## 2018-12-11 ENCOUNTER — Telehealth: Payer: Self-pay | Admitting: *Deleted

## 2018-12-11 NOTE — Telephone Encounter (Signed)
The patient has an appointment tomorrow July 21st, 2020. [name]

## 2018-12-12 ENCOUNTER — Ambulatory Visit (INDEPENDENT_AMBULATORY_CARE_PROVIDER_SITE_OTHER): Payer: Medicare HMO | Admitting: Podiatry

## 2018-12-12 ENCOUNTER — Encounter: Payer: Self-pay | Admitting: Podiatry

## 2018-12-12 ENCOUNTER — Other Ambulatory Visit: Payer: Self-pay

## 2018-12-12 VITALS — Temp 98.6°F

## 2018-12-12 DIAGNOSIS — M779 Enthesopathy, unspecified: Secondary | ICD-10-CM

## 2018-12-12 DIAGNOSIS — M722 Plantar fascial fibromatosis: Secondary | ICD-10-CM | POA: Diagnosis not present

## 2018-12-12 MED ORDER — DICLOFENAC SODIUM 1 % TD GEL: 2.0000 g | Freq: Four times a day (QID) | TRANSDERMAL | 1 refills | Status: DC

## 2018-12-13 NOTE — Progress Notes (Signed)
Subjective: 53 year old female presents the office to discuss MRI results.  She states that she still gets pain mostly to the heels of the outside aspects of her feet.  This is been ongoing for 8 months.  He has had some burning sensation as well to the areas.  No radiating pain.  No weakness.  No acute changes.  She states that she did do some water therapy previously which was helpful. Denies any systemic complaints such as fevers, chills, nausea, vomiting. No acute changes since last appointment, and no other complaints at this time.   Objective: AAO x3, NAD DP/PT pulses palpable bilaterally, CRT less than 3 seconds There is tenderness palpation of plantar medial tubercle of the calcaneus at insertion of the plantar fascia bilaterally.  On the left side there is tenderness palpation the course of the peroneal tendon but not on the right side.  The tendon appears to be tight.  Mild discomfort the posterior aspect calcaneus on the distal portion of the Achilles tendon right side but not on the left.  No area pinpoint tenderness identified today.  Negative Tinel sign. No open lesions or pre-ulcerative lesions.  No pain with calf compression, swelling, warmth, erythema  MRI RIGHT: IMPRESSION: 1. Significant changes of plantar fasciitis with partial thickness tearing of the deep attachment fibers centrally. Associated reactive marrow edema in the calcaneus. 2. Intact medial, lateral and anterior ankle tendons and medial and lateral ankle ligaments. 3. Mild distal Achilles tendinopathy. 4. No significant bony findings.  No stress fracture or AVN.  MRI LEFT: IMPRESSION: 1. Moderate distal Achilles tendinopathy with small interstitial tears and retrocalcaneal bursitis. 2. Moderate changes of plantar fasciitis (not as significant as the right foot). 3. Mild tenosynovitis involving the peroneal tendons and also the posterior tibialis tendon but no significant tendinopathy or tear. 4. Intact  medial and lateral ankle ligaments. 5. No acute or significant bony findings.  Assessment: Bilateral chronic foot pain with plantar fasciitis resulting creatinine on tendinitis and Achilles numbness.  Plan: -All treatment options discussed with the patient including all alternatives, risks, complications.  After my discussion regards to treatment options and reviewed the MRIs with her we discussed treatment options.  We will start physical therapy.  Prescription for [facility] was written.  Also we discussed shoe modifications and orthotics.  Dispensed power step inserts.  We discussed surgical intervention as well if needed.  Reports insert the pathology is bilaterally plantar wart on immobilization.  On the left side there was some MRI findings of Achilles pathology she has no clinical symptoms to this area.  Prescribed Voltaren gel as well. -Patient encouraged to call the office with any questions, concerns, change in symptoms.    Trula Slade DPM

## 2018-12-14 NOTE — Addendum Note (Signed)
Addended by: Cranford Mon R on: 12/14/2018 05:13 PM   Modules accepted: Orders

## 2018-12-15 ENCOUNTER — Telehealth: Payer: Self-pay | Admitting: *Deleted

## 2018-12-15 NOTE — Telephone Encounter (Signed)
-----   Message from Trula Slade, DPM sent at 12/13/2018  2:44 PM EDT ----- I didn't realize she was medicaid. We will probably need to send her to [facility] and not [facility].

## 2018-12-15 NOTE — Telephone Encounter (Signed)
I have sent a referral thru [facility] for physical therapy. [name] 

## 2018-12-21 ENCOUNTER — Telehealth: Payer: Self-pay | Admitting: *Deleted

## 2018-12-21 NOTE — Telephone Encounter (Signed)
Called patient and spoke with patient about the physical therapy and patient stated that something had came up and could not do it at that time and patient stated today that she would call and reschedule. Lattie Haw

## 2019-01-03 ENCOUNTER — Telehealth: Payer: Self-pay | Admitting: Neurology

## 2019-01-03 ENCOUNTER — Other Ambulatory Visit: Payer: Self-pay | Admitting: Neurology

## 2019-01-03 NOTE — Telephone Encounter (Signed)
Pt called wanting to confirm if the pharmacy request has been received for her medication and if it could be take care of today. She is on her 3rd day of having a migraine. Please advise.

## 2019-01-03 NOTE — Telephone Encounter (Signed)
[facility] refill request approved. Spoke with pt. She verbalized appreciation.

## 2019-01-09 ENCOUNTER — Ambulatory Visit (HOSPITAL_COMMUNITY)
Admission: EM | Admit: 2019-01-09 | Discharge: 2019-01-09 | Disposition: A | Discharge status: Home / Self Care | Payer: Medicare HMO | Attending: Family Medicine | Admitting: Family Medicine

## 2019-01-09 ENCOUNTER — Other Ambulatory Visit: Payer: Self-pay

## 2019-01-09 ENCOUNTER — Encounter (HOSPITAL_COMMUNITY): Payer: Self-pay | Admitting: Emergency Medicine

## 2019-01-09 ENCOUNTER — Ambulatory Visit (INDEPENDENT_AMBULATORY_CARE_PROVIDER_SITE_OTHER): Payer: Medicare HMO

## 2019-01-09 DIAGNOSIS — M25561 Pain in right knee: Secondary | ICD-10-CM

## 2019-01-09 MED ORDER — IBUPROFEN 600 MG PO TABS: 600.0000 mg | ORAL_TABLET | Freq: Four times a day (QID) | ORAL | 0 refills | Status: DC | PRN

## 2019-01-09 NOTE — ED Triage Notes (Signed)
Pt sts right knee pain after twisting 4 days ago while walking

## 2019-01-09 NOTE — Discharge Instructions (Addendum)
Xray normal Use anti-inflammatories for pain/swelling. You may take up to 600-800 mg Ibuprofen every 8 hours with food. You may supplement Ibuprofen with Tylenol [phone] mg every 8 hours.  Ice and elevate  Follow up with sports med/ortho if symptoms persisting for further evaluation and imaging

## 2019-01-09 NOTE — ED Provider Notes (Signed)
[facility]    CSN: [id number] Arrival date & time: 01/09/19  1045     History   Chief Complaint Chief Complaint  Patient presents with  . Knee Pain    HPI [name] is a 53 y.o. female history of migraines, bipolar type II, hypertension, presenting today for evaluation of right knee pain.  Patient states that 4 days ago she was walking and the dog ran out in front of her, this caused her to twist her leg while on a curb.  She denies falling or any direct blow or trauma to the knee.  She did feel a pop when she planted her knee.  Since she has had significant pain and discomfort with weightbearing.  Denies previous injury to knee.  Will have occasional pain that extends into calf, but overall calf without pain.  Denies numbness or tingling.  HPI  Past Medical History:  Diagnosis Date  . Allergic rhinitis   . Allergic to cats    pet dander  . Anxiety   . Arthritis 09/2018   both feet  . Asthma   . Cervicalgia   . Depression   . Environmental allergies   . Fibromyalgia   . GERD (gastroesophageal reflux disease)   . Grave's disease   . Headache    migraines  . High cholesterol   . Hypertension   . Panic disorder   . Risk for falls   . Sciatica   . 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 Syncope and collapse   . Thyroid disease   . Vasovagal syncope   . Vertigo     Patient Active Problem List   Diagnosis Date Noted  . Leg weakness 03/21/2018  . Intractable chronic migraine without aura and without status migrainosus 04/27/2016  . Intractable chronic migraine without aura and with status migrainosus 01/14/2016  . Risk for falls 09/02/2015  . Insomnia 05/27/2015  . Bipolar 2 disorder (HCC) 02/25/2015  . 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 Non morbid obesity 11/14/2014  . Severe 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 (HCC) 10/10/2014  . GAD  (generalized anxiety disorder) 10/10/2014  . Panic disorder with agoraphobia 10/10/2014  . Social anxiety disorder 10/10/2014  . Other complicated headache syndrome 09/24/2014  . Jaw pain-acute TMJ 09/24/14 09/24/2014  . Low back pain 07/24/2014  . Encounter for monitoring opioid maintenance therapy 07/02/2014  . Pharyngoesophageal dysphagia 06/24/2014  . S/P gastric bypass 06/24/2014  . Status post bariatric surgery 06/24/2014  . Protein-calorie malnutrition, severe (HCC) 11/03/2012  . Severe malnutrition (HCC) 11/03/2012  . Lethargy 11/01/2012  . Drug overdose, multiple drugs 11/01/2012  . Hypokalemia 11/01/2012  . Migraine 05/22/2012  . Depression 11/22/2011  . Chronic pain 11/22/2011  . Graves' disease   . Asthma   . GERD (gastroesophageal reflux disease)   . Need for prophylactic postmenopausal hormone replacement therapy 11/03/2011  . Allergic rhinitis 09/28/2011  . Anemia 09/28/2011  . Essential hypertension 09/28/2011  . Memory loss 09/28/2011    Past Surgical History:  Procedure Laterality Date  . ABDOMINAL HYSTERECTOMY  2006   partial  . GASTRIC BYPASS  2008    OB History    Gravida  2   Para  2   Term      Preterm      AB      Living  2     SAB      TAB  Ectopic      Multiple      Live Births               Home Medications    Prior to Admission medications   Medication Sig Start Date End Date Taking? Authorizing Provider  AIMOVIG 140 MG/ML SOAJ INJECT 140 MG INTO THE SKIN EVERY 30 DAYS 01/03/19   Melvenia Beam, MD  albuterol (PROVENTIL HFA;VENTOLIN HFA) 108 (90 BASE) MCG/ACT inhaler Inhale 2 puffs into the lungs every 6 (six) hours as needed for wheezing.    [provider]  azelastine (ASTELIN) 137 MCG/SPRAY nasal spray Place 1 spray into the nose 2 (two) times daily. Use in each nostril as directed    [provider]  BIOTIN 5000 PO Take 5,000 Units by mouth daily.     [provider]   budesonide-formoterol (SYMBICORT) 80-4.5 MCG/ACT inhaler Inhale 2 puffs into the lungs 2 (two) times daily.    [provider]  cetirizine (ZYRTEC) 10 MG tablet Take 10 mg by mouth daily.    [provider]  clonazePAM (KLONOPIN) 0.5 MG tablet Take 1 tablet (0.5 mg total) by mouth 3 (three) times daily as needed. for anxiety 11/09/18   Charlcie Cradle, MD  cyclobenzaprine (FLEXERIL) 10 MG tablet Take 10 mg by mouth 2 (two) times daily as needed. msucle spasms 02/01/18   [provider]  diclofenac sodium (VOLTAREN) 1 % GEL Apply 2 g topically 4 (four) times daily. 12/12/18   Trula Slade, DPM  diphenhydrAMINE HCl, Sleep, 50 MG CAPS Take 150 mg by mouth at bedtime.    [provider]  doxepin (SINEQUAN) 10 MG/ML solution Take 5 mg or 10 mg at night to help with sleep, that is 0.5 ml or max of 1 ml 12/06/18   Hampton Abbot, MD  Epinastine HCl (ELESTAT) 0.05 % ophthalmic solution Place 1 drop into both eyes 2 (two) times daily as needed (itching).     [provider]  EPINEPHrine (EPIPEN) 0.3 mg/0.3 mL DEVI Inject 0.3 mg into the muscle as needed. For allergic reaction to stinging insects, mushrooms, and shellfish     [provider]  estradiol (ESTRACE) 0.5 MG tablet Take 0.5 mg by mouth daily.    [provider]  fexofenadine-pseudoephedrine (ALLEGRA-D) 60-120 MG 12 hr tablet Take by mouth. 07/11/18   [provider]  fluconazole (DIFLUCAN) 150 MG tablet TAKE ONE TABLET BY MOUTH AS A ONE TIME DOSE 08/14/18   [provider]  fluticasone (FLONASE) 50 MCG/ACT nasal spray Place 1 spray into both nostrils as needed (allergies/runny nose).     [provider]  gabapentin (NEURONTIN) 300 MG capsule Take 600 mg by mouth 2 (two) times daily.     [provider]  ibuprofen (ADVIL) 600 MG tablet Take 1 tablet (600 mg total) by mouth every 6 (six) hours as needed. 01/09/19   Lilygrace Rodick C, PA-C  lamoTRIgine  (LAMICTAL) 25 MG tablet Take 2 tablets (50 mg total) by mouth 2 (two) times daily. Take one daily for one week and then increase to one twice a day 11/09/18   Charlcie Cradle, MD  Menthol-Methyl Salicylate (BEN GAY GREASELESS) 10-15 % greaseless cream Apply 1 application topically 3 (three) times daily as needed for pain.    [provider]  mometasone-formoterol (DULERA) 100-5 MCG/ACT AERO Inhale 2 puffs into the lungs 2 (two) times daily as needed for wheezing or shortness of breath.     [provider]  montelukast (SINGULAIR) 10 MG tablet Take 1 tablet (10 mg total) by mouth at bedtime. 02/13/15   Danelle Berryapia, Leisa, PA-C  Multiple Vitamins-Minerals (MULTIVITAMIN WITH MINERALS) tablet Take 1 tablet by mouth daily.    [provider]  naloxegol oxalate (MOVANTIK) 25 MG TABS tablet Take 25 mg by mouth daily.     [provider]  naloxone [facility](NARCAN) nasal spray 4 mg/0.1 mL  06/19/18   [provider]  Oxycodone HCl 10 MG TABS Take 10 mg by mouth 2 (two) times daily as needed. 03/17/18   [provider]  oxymetazoline (AFRIN NASAL SPRAY) 0.05 % nasal spray Place 1 spray into both nostrils 2 (two) times daily. Patient taking differently: Place 1 spray into both nostrils 2 (two) times daily as needed for congestion.  07/07/15   Barrett HenleNadeau, Nicole Elizabeth, PA-C  pantoprazole (PROTONIX) 40 MG tablet Take by mouth. 08/14/18   [provider]  polyethylene glycol (MIRALAX / GLYCOLAX) packet Take 17 g by mouth every other day.     [provider]  prazosin (MINIPRESS) 1 MG capsule Take 1 capsule (1 mg total) by mouth at bedtime. 11/09/18   Oletta DarterAgarwal, [place], MD  ranitidine (ZANTAC) 150 MG tablet Take 150 mg by mouth daily.    [provider]  RELISTOR 150 MG TABS  07/25/18   [provider]  SUMAtriptan (IMITREX) 100 MG tablet Take 1 tablet (100 mg total) by mouth once as needed for up to 1 dose for migraine. May repeat in 2 hours if  headache persists or recurs. Patient not taking: Reported on 11/07/2018 04/12/17   Anson FretAhern, Antonia B, MD  triamcinolone cream (KENALOG) 0.1 % Apply topically. 07/11/18 01/22/20  [provider]  vitamin B-12 (CYANOCOBALAMIN) 100 MCG tablet Take 100 mcg by mouth daily.    [provider]  Vitamin D, Ergocalciferol, (DRISDOL) 1.25 MG (50000 UT) CAPS capsule Take 1 capsule (50,000 Units total) by mouth every 7 (seven) days. 11/08/18   Anson FretAhern, Antonia B, MD    Family History Family History  Problem Relation Age of Onset  . Hypertension Mother   . Cirrhosis Mother   . Alcohol abuse Mother   . Depression Mother   . Physical abuse Mother   . Hypertension Sister   . Alcohol abuse Sister   . Drug abuse Sister   . Depression Sister   . Anxiety disorder Sister   . OCD Sister   . Hypertension Father   . Alcohol abuse Father   . ADD / ADHD Other   . Depression Sister   . Diabetes Other   . Hypertension Other   . Diabetes Maternal Uncle   . Seizures Cousin   . Dementia Neg Hx     Social History Social History   Tobacco Use  . Smoking status: Never Smoker  . Smokeless tobacco: Never Used  Substance Use Topics  . Alcohol use: Yes    Alcohol/week: 1.0 standard drinks    Types: 1 Glasses of wine per week    Comment: one glass of wine every 3 months  . Drug use: No     Allergies   Bee venom, Coconut fatty acids, Mushroom ext cmplx(shiitake-reishi-mait), Nutritional supplements, Other, Shellfish allergy, Strawberry extract, and Hydrocodone-acetaminophen   Review of Systems Review of Systems  Constitutional: Negative for fatigue and fever.  Eyes: Negative for visual disturbance.  Respiratory: Negative for shortness of breath.   Cardiovascular: Negative for chest pain.  Gastrointestinal: Negative for abdominal pain, nausea and  vomiting.  Musculoskeletal: Positive for arthralgias, gait problem and joint swelling.  Skin: Negative for color change, rash and wound.   Neurological: Negative for dizziness, weakness, light-headedness and headaches.     Physical Exam Triage Vital Signs ED Triage Vitals  Enc Vitals Group     BP 01/09/19 1121 125/77     Pulse Rate 01/09/19 1121 92     Resp 01/09/19 1121 18     Temp 01/09/19 1121 98.2 F (36.8 C)     Temp Source 01/09/19 1121 Oral     SpO2 01/09/19 1121 96 %     Weight --      Height --      Head Circumference --      Peak Flow --      Pain Score 01/09/19 1122 6     Pain Loc --      Pain Edu? --      Excl. in GC? --    No data found.  Updated Vital Signs BP 125/77 (BP Location: Right Arm)   Pulse 92   Temp 98.2 F (36.8 C) (Oral)   Resp 18   SpO2 96%   Visual Acuity Right Eye Distance:   Left Eye Distance:   Bilateral Distance:    Right Eye Near:   Left Eye Near:    Bilateral Near:     Physical Exam Vitals signs and nursing note reviewed.  Constitutional:      Appearance: She is well-developed.     Comments: No acute distress; sitting in wheelchair  HENT:     Head: Normocephalic and atraumatic.     Nose: Nose normal.  Eyes:     Conjunctiva/sclera: Conjunctivae normal.  Neck:     Musculoskeletal: Neck supple.  Cardiovascular:     Rate and Rhythm: Normal rate.  Pulmonary:     Effort: Pulmonary effort is normal. No respiratory distress.  Abdominal:     General: There is no distension.  Musculoskeletal: Normal range of motion.     Comments: Ambulates to exam table  With significant discomfort and assistance  Right knee: mild swelling, no discoloration; tender to palpation of superior patella, and lateral joint line, nontender over patellar tendon/tibial tubercle and along medial joint line  Limited ROM due to discomfort No laxity appreciated with varus and valgus stress Negative lachmans and mcmurrays Special tests limited due to patient resistance and pain  Skin:    General: Skin is warm and dry.  Neurological:     Mental Status: She is alert and oriented to person,  place, and time.      UC Treatments / Results  Labs (all labs ordered are listed, but only abnormal results are displayed) Labs Reviewed - No data to display  EKG   Radiology Dg Knee Complete 4 Views Right  Result Date: 01/09/2019 CLINICAL DATA:  Twisted right knee, felt pop 4 days ago. Tenderness and pain over patella and laterally EXAM: RIGHT KNEE - COMPLETE 4+ VIEW COMPARISON:  None. FINDINGS: No evidence of fracture, dislocation, or joint effusion. No evidence of arthropathy or other focal bone abnormality. Soft tissues are unremarkable. IMPRESSION: Negative. Electronically Signed   By: Charlett NoseKevin  Dover M.D.   On: 01/09/2019 12:23    Procedures Procedures (including critical care time)  Medications Ordered in UC Medications - No data to display  Initial Impression / Assessment and Plan / UC Course  I have reviewed the triage vital signs and the nursing notes.  Pertinent labs & imaging results that  were available during my care of the patient were reviewed by me and considered in my medical decision making (see chart for details).     X-ray negative for acute bony abnormality.  Given mechanism of injury, popping sensation and significant amount of pain, cannot rule out ligamentous/meniscal injury.  Patient plans to use crutches at home, will provide hinged knee brace.  Will follow-up with sports medicine/Ortho for further evaluation/imaging.Discussed strict return precautions. Patient verbalized understanding and is agreeable with plan.  Final Clinical Impressions(s) / UC Diagnoses   Final diagnoses:  Acute pain of right knee     Discharge Instructions     Xray normal Use anti-inflammatories for pain/swelling. You may take up to 600-800 mg Ibuprofen every 8 hours with food. You may supplement Ibuprofen with Tylenol [phone] mg every 8 hours.  Ice and elevate  Follow up with sports med/ortho if symptoms persisting for further evaluation and imaging       ED  Prescriptions    Medication Sig Dispense Auth. Provider   ibuprofen (ADVIL) 600 MG tablet Take 1 tablet (600 mg total) by mouth every 6 (six) hours as needed. 30 tablet Jaxyn Rout, [place] C, PA-C     Controlled Substance Prescriptions [place] Controlled Substance Registry consulted? Not Applicable   Lew Dawes, [place] 01/09/19 1247

## 2019-01-11 ENCOUNTER — Other Ambulatory Visit: Payer: Self-pay

## 2019-01-11 ENCOUNTER — Ambulatory Visit (INDEPENDENT_AMBULATORY_CARE_PROVIDER_SITE_OTHER): Payer: Medicare HMO | Admitting: Psychiatry

## 2019-01-11 DIAGNOSIS — F5105 Insomnia due to other mental disorder: Secondary | ICD-10-CM

## 2019-01-11 DIAGNOSIS — F4010 Social phobia, unspecified: Secondary | ICD-10-CM

## 2019-01-11 DIAGNOSIS — F333 Major depressive disorder, recurrent, severe with psychotic symptoms: Secondary | ICD-10-CM

## 2019-01-11 DIAGNOSIS — F411 Generalized anxiety disorder: Secondary | ICD-10-CM | POA: Diagnosis not present

## 2019-01-11 DIAGNOSIS — F4001 Agoraphobia with panic disorder: Secondary | ICD-10-CM | POA: Diagnosis not present

## 2019-01-11 DIAGNOSIS — G4700 Insomnia, unspecified: Secondary | ICD-10-CM

## 2019-01-11 MED ORDER — PRAZOSIN HCL 1 MG PO CAPS: 1.0000 mg | ORAL_CAPSULE | Freq: Every day | ORAL | 1 refills | Status: DC

## 2019-01-11 MED ORDER — DOXEPIN HCL 10 MG/ML PO CONC: ORAL | 0 refills | Status: DC

## 2019-01-11 MED ORDER — CLONAZEPAM 0.5 MG PO TABS: 0.5000 mg | ORAL_TABLET | Freq: Three times a day (TID) | ORAL | 1 refills | Status: DC | PRN

## 2019-01-11 MED ORDER — LAMOTRIGINE 25 MG PO TABS: 50.0000 mg | ORAL_TABLET | Freq: Two times a day (BID) | ORAL | 1 refills | Status: DC

## 2019-01-11 NOTE — Progress Notes (Signed)
Virtual Visit via Telephone Note  I connected with [name] on 01/11/19 at 11:30 AM EDT by telephone and verified that I am speaking with the correct person using two identifiers.  Location: Patient: pt is currently at another doc appt Provider: office   I discussed the limitations, risks, security and privacy concerns of performing an evaluation and management service by telephone and the availability of in person appointments. I also discussed with the patient that there may be a patient responsible charge related to this service. The patient expressed understanding and agreed to proceed.   History of Present Illness: Pt twisted her ankle and is currently at the doctor office waiting to see the doctor. Her sleep has improved with Doxepin and Benadryl. she has been walking some too which helps with sleep. The depression is a little better. She is not as focused on her daughter. Pt has set up some boundaries with her family which has helped a lot. Pt denies SI/HI. She has a lot of anxiety that comes and goes. It makes her easily stressed and irritable. She has to take Klonopin before going out. Pt is very anxious about being infected with COVID. She is staying at home as much as possible. [name] states that cleanliness was always an issues but it is 3x worse now. Pt is eating better. She is eating in the middle of the night but not as often. Her sister gave her some samples of a tea called Insot that helps with sleep and food cravings. Pt then ended the conversation because her doctor walked in the room. We were unable to complete reviewed of meds, allergies and medical problems. Pt will reschedule.     Observations/Objective: I spoke with [name] on the phone.  Pt was calm, pleasant and cooperative.  Pt was engaged in the conversation and answered questions appropriately.  Speech was clear and coherent with normal rate, tone and volume.  Mood is depressed and anxious, affect is  congruent but overall brighter than previous appts. Thought processes are coherent, goal oriented and intact.  Thought content is logical.  Pt denies SI/HI.   Pt denies auditory and visual hallucinations and did not appear to be responding to internal stimuli.  Memory and concentration are good.  Fund of knowledge and use of language are average.  Insight and judgment are fair.  I am unable to comment on psychomotor activity, general appearance, hygiene, or eye contact as I was unable to physically see the patient on the phone.  Vital signs not available since interview conducted virtually.   I reviewed the information below on 01/11/2019 and have updated it Assessment and Plan: MDD-recurrent, severe with psychotic features versus bipolar 2 disorder; GAD; panic disorder with agoraphobia; social anxiety disorder; insomnia; rule out cluster B traits   Lamictal 50mg  po BID for mood  Klonopin 0.5mg  po TID prn anxiety  Doxepin 10mg /ml- take 5-10mg  po qHS prn insomnia  Prazosin 1mg  po qHS for nightmares   Follow Up Instructions: In 4-6 weeks or sooner if needed   I discussed the assessment and treatment plan with the patient. The patient was provided an opportunity to ask questions and all were answered. The patient agreed with the plan and demonstrated an understanding of the instructions.   The patient was advised to call back or seek an in-person evaluation if the symptoms worsen or if the condition fails to improve as anticipated.  I provided 10 minutes of non-face-to-face time during this encounter.  Charlcie Cradle, MD

## 2019-01-15 ENCOUNTER — Ambulatory Visit: Payer: Medicare HMO | Admitting: Podiatry

## 2019-01-17 ENCOUNTER — Other Ambulatory Visit: Payer: Self-pay | Admitting: Physician Assistant

## 2019-01-17 DIAGNOSIS — G8929 Other chronic pain: Secondary | ICD-10-CM

## 2019-01-18 ENCOUNTER — Other Ambulatory Visit (HOSPITAL_COMMUNITY): Payer: Self-pay

## 2019-01-18 DIAGNOSIS — F333 Major depressive disorder, recurrent, severe with psychotic symptoms: Secondary | ICD-10-CM

## 2019-01-18 MED ORDER — LAMOTRIGINE 25 MG PO TABS: 50.0000 mg | ORAL_TABLET | Freq: Two times a day (BID) | ORAL | 1 refills | Status: DC

## 2019-01-20 ENCOUNTER — Ambulatory Visit
Admission: RE | Admit: 2019-01-20 | Discharge: 2019-01-20 | Disposition: A | Discharge status: Home / Self Care | Payer: Medicare HMO | Source: Ambulatory Visit | Attending: Physician Assistant | Admitting: Physician Assistant

## 2019-01-20 ENCOUNTER — Other Ambulatory Visit: Payer: Self-pay

## 2019-01-20 DIAGNOSIS — G8929 Other chronic pain: Secondary | ICD-10-CM

## 2019-01-20 DIAGNOSIS — M25561 Pain in right knee: Secondary | ICD-10-CM

## 2019-02-15 ENCOUNTER — Other Ambulatory Visit: Payer: Self-pay

## 2019-02-15 ENCOUNTER — Ambulatory Visit (INDEPENDENT_AMBULATORY_CARE_PROVIDER_SITE_OTHER): Payer: Medicare HMO | Admitting: Psychiatry

## 2019-02-15 ENCOUNTER — Encounter (HOSPITAL_COMMUNITY): Payer: Self-pay | Admitting: Psychiatry

## 2019-02-15 DIAGNOSIS — F5105 Insomnia due to other mental disorder: Secondary | ICD-10-CM

## 2019-02-15 DIAGNOSIS — F333 Major depressive disorder, recurrent, severe with psychotic symptoms: Secondary | ICD-10-CM

## 2019-02-15 DIAGNOSIS — F411 Generalized anxiety disorder: Secondary | ICD-10-CM

## 2019-02-15 DIAGNOSIS — F4001 Agoraphobia with panic disorder: Secondary | ICD-10-CM

## 2019-02-15 MED ORDER — DOXEPIN HCL 10 MG/ML PO CONC: ORAL | 0 refills | Status: DC

## 2019-02-15 MED ORDER — CLONAZEPAM 0.5 MG PO TABS: 0.5000 mg | ORAL_TABLET | Freq: Three times a day (TID) | ORAL | 1 refills | Status: DC | PRN

## 2019-02-15 MED ORDER — PRAZOSIN HCL 1 MG PO CAPS: 1.0000 mg | ORAL_CAPSULE | Freq: Every day | ORAL | 1 refills | Status: DC

## 2019-02-15 MED ORDER — DIVALPROEX SODIUM ER 250 MG PO TB24: 250.0000 mg | ORAL_TABLET | Freq: Every day | ORAL | 2 refills | Status: DC
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
## 2019-02-27 DIAGNOSIS — R4189 Other symptoms and signs involving cognitive functions and awareness: Secondary | ICD-10-CM | POA: Insufficient documentation

## 2019-03-08 ENCOUNTER — Ambulatory Visit (INDEPENDENT_AMBULATORY_CARE_PROVIDER_SITE_OTHER): Payer: Medicare HMO | Admitting: Bariatrics

## 2019-03-14 ENCOUNTER — Encounter: Payer: Self-pay | Admitting: Neurology

## 2019-03-14 ENCOUNTER — Ambulatory Visit (INDEPENDENT_AMBULATORY_CARE_PROVIDER_SITE_OTHER): Payer: Medicare HMO | Admitting: Neurology

## 2019-03-14 ENCOUNTER — Other Ambulatory Visit: Payer: Self-pay

## 2019-03-14 VITALS — BP 123/75 | HR 87 | Temp 98.2°F | Ht 66.0 in | Wt 255.0 lb

## 2019-03-14 DIAGNOSIS — G43711 Chronic migraine without aura, intractable, with status migrainosus: Secondary | ICD-10-CM | POA: Diagnosis not present

## 2019-03-14 MED ORDER — AJOVY 225 MG/1.5ML ~~LOC~~ SOAJ: 225.0000 mg | SUBCUTANEOUS | 11 refills | Status: DC

## 2019-03-14 NOTE — Progress Notes (Addendum)
[facility]    Provider:  Dr Lucia Gaskins Referring Provider: Iona Hansen, NP Primary Care Physician:  Iona Hansen, NP  CC:  Migraines  [name] is having elevated BP. Stop Aimovig. Also try to recert botox. [name] has OSA and is not using a machine, [name] had a sleep study and it was negative 2 years ago. Formal neurocog testing neg for degenerate neurocognitive disease. We discussed her formal memory testing and I advised [name] meet with Dr. Jacquelyne Balint for any questions. We also discussed options for her migraines, [name] feels botox was most helpful. Stop cgrp.  Also try to get botox again.   [name] has failed imitrex and maxalt, side effects, contraindicated, will try nurtec. Also gave ajovy samples.   Daily headaches. No aura. Ongoing for over 2 year at daily headache frequency and >15 days a month moderately severe to severe headaches, no medication overuse. Migraine last 24-72 hours. Migraines are pressure on the left, pounding, throbbing, light sensitivity, sound sensitivity,nausea, movement makes it worse.  MEds tried: topamax, aimovig(stopped), flexeril, depakote, gabapentin, robaxin, verapamil, tizanidinem imitrex, paxil  HP 11/07/2018: This is a 53 year old female here as a new request from Zoe Lan, NP for memory changes.  I have seen her in the past for migraines and treated her with Botox, medications and suggested the new CGRP medications.  [name] has a past medical history of hypertension, high cholesterol, chronic pain, migraine, depression, anxiety, fibromyalgia, vertigo, syncope, sciatica, bipolar, morbid obesity, severe depressed bipolar 2 disorder. Her short term memory is impaired, long term as well. Going on years and getting worse. [name] forgets where [name] is going. [name] has to put address in to remember how to get to doctor. [name] can;t remember what [name] ate for dinner. [name] has all bills get paid by patient, no accidents in the home, [name] has recurrent syncope.   Migraines;  Started Aimovig, [name] will be having her 2nd shot and feels improved. Second shot was yesterday. Ongoing migraine for 3 days. Try Toradol today.   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  The overall AHI is in the normal range.  CPAP therapy was not warranted.  Weight loss was recommended.  I reviewed the sleep test data and agree with the above.  I personally reviewed MRI of the brain March 21, 2018 which showed some nonspecific white matter changes overall normal for age.  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  HPI 01/13/2016:  [name] is a 53 y.o. female here as a referral from Dr. Yetta Barre for migraines. Past medical history of hypertension, high cholesterol, chronic pain, migraine, depression, anxiety, fibromyalgia, vertigo syncope, sciatica, bipolar, morbid obesity, severe depressed bipolar 2 disorder. [name] has migraines daily, nothing makes them better. [name] had migraines for 19 days straight in July. Thought it was a sinus infection. [name] was placed on Topiramate many years ago for migraines and [name] continues to take it for 13-14s year, no inciting events or head trauma in the past, no family history of migraines. [name] is taking 100 mg in the morning and 100 mg at night of Topamax.  Migraines are pressure on the left, pounding, throbbing, light sensitivity,  sound sensitivity, every day. [name] has to wear her shades inside the house which makes her feel better. [name] takes oxycodone prescribed for her sciatica and joint pain every 3-4 days for the migraines. Discussed rebound headaches. No aura. No vision changes, no  dysarthria, no dysphasia, no meningismus. These are the same migraines that [name]'s been having for many years, no change in severity or frequency. Stress makes them worse and [name] has significant depression. Smells trigger her migraines. Migraines can be severe.  Reviewed notes, labs and imaging from outside physicians, which showed:   Reviewed images of MRI brain personally on CD 12/25/2015 and agree with the following: FINDINGS: No evidence for acute infarction, hemorrhage, mass lesion, hydrocephalus, or extra-axial fluid. Normal for age cerebral volume. Minor subcortical white matter disease on the LEFT, 3-4 foci of T2 and FLAIR hyperintensity, subcentimeter size frontal region. No similar findings on the RIGHT or in the periventricular white matter. Flow voids are maintained. Artifactual gradient sequence, but no definite foci of chronic hemorrhage. The extracranial soft tissues are unremarkable.  CBC and BMP were normal in April 2017 including creatinine of 0.81.  Review of Systems: Patient complains of symptoms per HPI as well as the following symptoms: Weight gain, fatigue, blurred vision, chest pain, palpitations, shortness of breath, wheezing, feeling hot, feeling cold, increased thirst, flushing, joint pain, cramps, aching muscles, allergies, runny nose, skin sensitivity, frequent infections, memory loss, confusion, headache, numbness, weakness, insomnia, sleepiness, restless, dizziness, passing out, depression, anxiety, not enough sleep, decreased energy, change in appetite, disinterest in activities, suicidal thoughts, racing thoughts. Pertinent negatives per HPI. All others negative.   Social History   Socioeconomic History   Marital status: Divorced    Spouse name: Not on file   Number of children: 2   Years of education: 14   Highest education level: Not on file  Occupational History   Not on file  Social Needs   Financial resource strain: Not on file   Food insecurity     Worry: Not on file    Inability: Not on file   Transportation needs    Medical: Not on file    Non-medical: Not on file  Tobacco Use   Smoking status: Never Smoker   Smokeless tobacco: Never Used  Substance and Sexual Activity   Alcohol use: Yes    Alcohol/week: 1.0 standard drinks    Types: 1 Glasses of wine per week    Comment: one glass of wine every 3 months   Drug use: No   Sexual activity: Not Currently    Birth control/protection: Surgical  Lifestyle   Physical activity    Days per week: Not on file    Minutes per session: Not on file   Stress: Not on file  Relationships   Social connections    Talks on phone: Not on file    Gets together: Not on file    Attends religious service: Not on file    Active member of club or organization: Not on file    Attends meetings of clubs or organizations: Not on file    Relationship status: Not on file   Intimate partner violence    Fear of current or ex partner: Not on file    Emotionally abused: Not on file    Physically abused: Not on file    Forced sexual activity: Not on file  Other Topics Concern   Not on file  Social History Narrative   Lives alone   Caffeine use:  sometimes per patient    Family History  Problem Relation Age of Onset   Hypertension Mother    Cirrhosis Mother    Alcohol abuse Mother    Depression Mother    Physical abuse Mother    Hypertension Sister    Alcohol abuse Sister    Drug abuse Sister    Depression Sister    Anxiety disorder Sister    OCD Sister    Hypertension Father    Alcohol abuse Father    ADD / ADHD Other    Depression Sister    Diabetes Other    Hypertension Other    Diabetes Maternal Uncle    Seizures Cousin    Dementia Neg Hx     Past Medical History:  Diagnosis Date   Allergic rhinitis    Allergic to cats    pet dander   Anxiety    Arthritis 09/2018   both feet   Asthma    Cervicalgia    Depression    Environmental  allergies    Fibromyalgia    GERD (gastroesophageal reflux disease)    Grave's disease    Headache    migraines   High cholesterol    Hypertension    Panic disorder    Risk for falls    Sciatica    Severe 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 (HCC)    Syncope and collapse    Thyroid disease    Vasovagal syncope    Vertigo     Past Surgical History:  Procedure Laterality Date   ABDOMINAL HYSTERECTOMY  2006   partial   GASTRIC BYPASS  2008    Current Outpatient Medications  Medication Sig Dispense Refill   albuterol (PROVENTIL HFA;VENTOLIN HFA) 108 (90 BASE) MCG/ACT inhaler Inhale 2 puffs into the lungs every 6 (six) hours as needed for wheezing.     azelastine (ASTELIN) 137 MCG/SPRAY nasal spray Place 1 spray into the nose 2 (two) times daily. Use in each nostril as directed     BIOTIN 5000 PO Take 5,000 Units by mouth daily.      budesonide-formoterol (SYMBICORT) 80-4.5 MCG/ACT inhaler Inhale 2 puffs into the lungs 2 (two) times daily.     cetirizine (ZYRTEC) 10 MG tablet Take 10 mg by mouth daily.     clonazePAM (KLONOPIN) 0.5 MG tablet Take 1 tablet (0.5 mg total) by mouth 3 (three) times daily as needed. for anxiety 90 tablet 1   cyclobenzaprine (FLEXERIL) 10 MG tablet Take 10 mg by mouth 2 (two) times daily as needed. msucle spasms  2   diclofenac sodium (VOLTAREN) 1 % GEL Apply 2 g topically 4 (four) times daily. 100 g 1   doxepin (SINEQUAN) 10 MG/ML solution Take 5 mg or 10 mg at night to help with sleep, that is 0.5 ml or max of 1 ml 120 mL 0   Epinastine HCl (ELESTAT) 0.05 % ophthalmic solution Place 1 drop into both eyes 2 (two) times daily as needed (itching).      EPINEPHrine (EPIPEN) 0.3 mg/0.3 mL DEVI Inject 0.3 mg into the muscle as needed. For allergic reaction to stinging insects, mushrooms, and shellfish      estradiol (ESTRACE) 0.5 MG tablet Take 0.5 mg by mouth daily.     fluticasone (FLONASE) 50 MCG/ACT  nasal spray Place 1 spray into both nostrils as needed (allergies/runny nose).      gabapentin (NEURONTIN) 300 MG capsule Take 600 mg by mouth 2 (two) times daily.  ibuprofen (ADVIL) 600 MG tablet Take 1 tablet (600 mg total) by mouth every 6 (six) hours as needed. 30 tablet 0   Menthol-Methyl Salicylate (BEN GAY GREASELESS) 10-15 % greaseless cream Apply 1 application topically 3 (three) times daily as needed for pain.     mometasone-formoterol (DULERA) 100-5 MCG/ACT AERO Inhale 2 puffs into the lungs 2 (two) times daily as needed for wheezing or shortness of breath.      montelukast (SINGULAIR) 10 MG tablet Take 1 tablet (10 mg total) by mouth at bedtime. 30 tablet 0   Multiple Vitamins-Minerals (MULTIVITAMIN WITH MINERALS) tablet Take 1 tablet by mouth daily.     naloxegol oxalate (MOVANTIK) 25 MG TABS tablet Take 25 mg by mouth daily.      Oxycodone HCl 10 MG TABS Take 10 mg by mouth 3 (three) times daily as needed.      oxymetazoline (AFRIN NASAL SPRAY) 0.05 % nasal spray Place 1 spray into both nostrils 2 (two) times daily. (Patient taking differently: Place 1 spray into both nostrils 2 (two) times daily as needed for congestion. ) 30 mL 0   pantoprazole (PROTONIX) 40 MG tablet Take by mouth.     polyethylene glycol (MIRALAX / GLYCOLAX) packet Take 17 g by mouth every other day.      prazosin (MINIPRESS) 1 MG capsule Take 1 capsule (1 mg total) by mouth at bedtime. 30 capsule 1   ranitidine (ZANTAC) 150 MG tablet Take 150 mg by mouth daily.     triamcinolone cream (KENALOG) 0.1 % Apply topically.     vitamin B-12 (CYANOCOBALAMIN) 100 MCG tablet Take 100 mcg by mouth daily.     Vitamin D, Ergocalciferol, (DRISDOL) 1.25 MG (50000 UT) CAPS capsule Take 1 capsule (50,000 Units total) by mouth every 7 (seven) days. 5 capsule 1   diphenhydrAMINE HCl, Sleep, 50 MG CAPS Take 150 mg by mouth at bedtime.     divalproex (DEPAKOTE ER) 250 MG 24 hr tablet Take 1 tablet (250 mg total)  by mouth daily. (Patient not taking: Reported on 03/14/2019) 30 tablet 2   fexofenadine-pseudoephedrine (ALLEGRA-D) 60-120 MG 12 hr tablet Take by mouth.     fluconazole (DIFLUCAN) 150 MG tablet TAKE ONE TABLET BY MOUTH AS A ONE TIME DOSE     naloxone (NARCAN) nasal spray 4 mg/0.1 mL      RELISTOR 150 MG TABS      SUMAtriptan (IMITREX) 100 MG tablet Take 1 tablet (100 mg total) by mouth once as needed for up to 1 dose for migraine. May repeat in 2 hours if headache persists or recurs. (Patient not taking: Reported on 03/14/2019) 9 tablet 12   No current facility-administered medications for this visit.     Allergies as of 03/14/2019 - Review Complete 03/14/2019  Allergen Reaction Noted   Bee venom Anaphylaxis 10/10/2014   Coconut fatty acids Anaphylaxis 04/27/2016   Mushroom ext cmplx(shiitake-reishi-mait) Anaphylaxis 05/05/2011   Nutritional supplements Anaphylaxis and Itching 05/05/2011   Other Anaphylaxis, Hives, and Itching 02/13/2015   Shellfish allergy Anaphylaxis 05/05/2011   Strawberry extract Anaphylaxis 02/13/2015   Hydrocodone-acetaminophen Itching 08/13/2008    Vitals: BP 123/75 (BP Location: Right Arm, Patient Position: Sitting)    Pulse 87    Temp 98.2 F (36.8 C) Comment: taken at front door   Ht 5\' 6"  (1.676 m)    Wt 255 lb (115.7 kg)    BMI 41.16 kg/m  Last Weight:  Wt Readings from Last 1 Encounters:  03/14/19 255 lb (115.7  kg)   Last Height:   Ht Readings from Last 1 Encounters:  03/14/19 5\' 6"  (1.676 m)   Physical exam: Exam: Gen: NAD, conversant, well nourised, obese, sitting in the office with sunglasses and a face mask on.        CV: RRR, no MRG. No Carotid Bruits. No peripheral edema, warm, nontender Eyes: Conjunctivae clear without exudates or hemorrhage  Neuro: Detailed Neurologic Exam  Speech:    Speech is normal; fluent and spontaneous with normal comprehension.  Cognition:    The patient is oriented to person, place, and time;      recent and remote memory intact;     language fluent;     normal attention, concentration,     fund of knowledge Cranial Nerves:    The pupils are equal, round, and reactive to light. The fundi are normal and spontaneous venous pulsations are present. Visual fields are full to finger confrontation. Extraocular movements are intact. Trigeminal sensation is intact and the muscles of mastication are normal. The face is symmetric. The palate elevates in the midline. Hearing intact. Voice is normal. Shoulder shrug is normal. The tongue has normal motion without fasciculations.   Coordination:    Normal finger to nose and heel to shin. Normal rapid alternating movements.   Gait:    Heel-toe and tandem gait are normal.   Motor Observation:    No asymmetry, no atrophy, and no involuntary movements noted. Tone:    Normal muscle tone.    Posture:    Posture is normal. normal erect    Strength:    Strength is V/V in the upper and lower limbs.      Sensation: intact to LT     Reflex Exam:  DTR's:    Deep tendon reflexes in the upper and lower extremities are normal bilaterally.   Toes:    The toes are downgoing bilaterally.   Clonus:    Clonus is absent.       Assessment/Plan:  This is a 53 year old female here as a new request from Zoe LanPenny Jones, NP for memory loss.  I have seen her in the past for migraines and treated her with Botox, medications and suggested the new CGRP medications.  [name] has a past medical history of hypertension, high cholesterol, chronic pain, migraine, depression, anxiety, fibromyalgia, vertigo, syncope, sciatica, bipolar, morbid obesity, severe depressed bipolar 2 disorder.  - [name] is having elevated BP. Stop Aimovig. Start Botox, [name] found that more helpful in the past  - Formal neurocog testing neg for degenerate neurocognitive disease. We discussed her formal memory testing and I advised [name] meet with Dr. Jacquelyne BalintMcDermott for any questions and continue with  psychiatry and therapy.   - We also discussed options for her migraines, [name] feels botox was most helpful. Stop cgrp.  Also try to get botox again.   Daily headaches. No aura. Ongoing for over 2 year at daily headache frequency and >15 days a month moderately severe to severe headaches, no medication overuse. Migraine last 24-72 hours. Migraines are pressure on the left, pounding, throbbing, light sensitivity, sound sensitivity,nausea, movement makes it worse.  MEds tried: topamax, aimovig(stopped), flexeril, depakote, gabapentin, robaxin, verapamil, tizanidinem imitrex, paxil   No orders of the defined types were placed in this encounter.    Migraines: Botox. F/u 4 months.  Discussed: To prevent or relieve headaches, try the following: Cool Compress. Lie down and place a cool compress on your head.  Avoid headache triggers. If certain foods or  odors seem to have triggered your migraines in the past, avoid them. A headache diary might help you identify triggers.  Include physical activity in your daily routine. Try a daily walk or other moderate aerobic exercise.  Manage stress. Find healthy ways to cope with the stressors, such as delegating tasks on your to-do list.  Practice relaxation techniques. Try deep breathing, yoga, massage and visualization.  Eat regularly. Eating regularly scheduled meals and maintaining a healthy diet might help prevent headaches. Also, drink plenty of fluids.  Follow a regular sleep schedule. Sleep deprivation might contribute to headaches Consider biofeedback. With this mind-body technique, you learn to control certain bodily functions -- such as muscle tension, heart rate and blood pressure -- to prevent headaches or reduce headache pain.    Proceed to emergency room if you experience new or worsening symptoms or symptoms do not resolve, if you have new neurologic symptoms or if headache is severe, or for any concerning symptom.   Provided education and  documentation from American headache Society toolbox including articles on: chronic migraine medication overuse headache, chronic migraines, prevention of migraines, behavioral and other nonpharmacologic treatments for headache.   Cc Zoe Lan  A total of 25 minutes was spent face-to-face with this patient. Over half this time was spent on counseling patient on the  1. Chronic migraine without aura, with intractable migraine, so stated, with status migrainosus    diagnosis and different diagnostic and therapeutic options, counseling and coordination of care, risks ans benefits of management, compliance, or risk factor reduction and education.     Naomie Dean, MD  [facility] [address] [place], [place] [address]  Phone [phone] Fax [phone]

## 2019-03-14 NOTE — Patient Instructions (Signed)
STOP AIMOVIG START AJOVY monthly We will call you about botox  Fremanezumab injection What is this medicine? FREMANEZUMAB (fre ma NEZ ue mab) is used to prevent migraine headaches. This medicine may be used for other purposes; ask your health care provider or pharmacist if you have questions. COMMON BRAND NAME(S): AJOVY What should I tell my health care provider before I take this medicine? They need to know if you have any of these conditions:  an unusual or allergic reaction to fremanezumab, other medicines, foods, dyes, or preservatives  pregnant or trying to get pregnant  breast-feeding How should I use this medicine? This medicine is for injection under the skin. You will be taught how to prepare and give this medicine. Use exactly as directed. Take your medicine at regular intervals. Do not take your medicine more often than directed. It is important that you put your used needles and syringes in a special sharps container. Do not put them in a trash can. If you do not have a sharps container, call your pharmacist or healthcare provider to get one. Talk to your pediatrician regarding the use of this medicine in children. Special care may be needed. Overdosage: If you think you have taken too much of this medicine contact a poison control center or emergency room at once. NOTE: This medicine is only for you. Do not share this medicine with others. What if I miss a dose? If you miss a dose, take it as soon as you can. If it is almost time for your next dose, take only that dose. Do not take double or extra doses. What may interact with this medicine? Interactions are not expected. This list may not describe all possible interactions. Give your health care provider a list of all the medicines, herbs, non-prescription drugs, or dietary supplements you use. Also tell them if you smoke, drink alcohol, or use illegal drugs. Some items may interact with your medicine. What should I watch  for while using this medicine? Tell your doctor or healthcare professional if your symptoms do not start to get better or if they get worse. What side effects may I notice from receiving this medicine? Side effects that you should report to your doctor or health care professional as soon as possible:  allergic reactions like skin rash, itching or hives, swelling of the face, lips, or tongue Side effects that usually do not require medical attention (report these to your doctor or health care professional if they continue or are bothersome):  pain, redness, or irritation at site where injected This list may not describe all possible side effects. Call your doctor for medical advice about side effects. You may report side effects to FDA at 1-800-FDA-1088. Where should I keep my medicine? Keep out of the reach of children. You will be instructed on how to store this medicine. Throw away any unused medicine after the expiration date on the label. NOTE: This sheet is a summary. It may not cover all possible information. If you have questions about this medicine, talk to your doctor, pharmacist, or health care provider.  2020 Elsevier/Gold Standard (2017-02-07 17:22:56)

## 2019-03-15 ENCOUNTER — Telehealth: Payer: Self-pay | Admitting: Neurology

## 2019-03-15 NOTE — Telephone Encounter (Signed)
[name], Patient would like to go back to Botox, she feels it was way more helpful than CGRP. Stopped CGRP, can we try to get her approved again?  Thank you

## 2019-03-19 ENCOUNTER — Encounter (HOSPITAL_COMMUNITY): Payer: Self-pay

## 2019-03-19 ENCOUNTER — Ambulatory Visit (HOSPITAL_COMMUNITY)
Admission: EM | Admit: 2019-03-19 | Discharge: 2019-03-19 | Disposition: A | Discharge status: Home / Self Care | Payer: Medicare HMO | Attending: Family Medicine | Admitting: Family Medicine

## 2019-03-19 ENCOUNTER — Other Ambulatory Visit: Payer: Self-pay

## 2019-03-19 ENCOUNTER — Ambulatory Visit (INDEPENDENT_AMBULATORY_CARE_PROVIDER_SITE_OTHER): Payer: Medicare HMO

## 2019-03-19 DIAGNOSIS — S20212A Contusion of left front wall of thorax, initial encounter: Secondary | ICD-10-CM | POA: Diagnosis not present

## 2019-03-19 NOTE — Telephone Encounter (Signed)
I called and scheduled the patient for her injection and told her I would be working on approvals. DW

## 2019-03-19 NOTE — ED Provider Notes (Signed)
and [facility]    CSN: [id number] Arrival date & time: 03/19/19  1421      History   Chief Complaint Chief Complaint  Patient presents with  . Loss of Consciousness  . Fall    HPI [name] is a 53 y.o. female.   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  She reports having a vertigo condition which leads to syncope and despite her work-up, no treatment has been forthcoming.  She states that she can feel when 1 of these episodes is coming on and so her neurologist is allowed her to continue driving.  She gets about 4-5 vertigo episodes a year.  Patient is on disability with chronic sciatica and fibromyalgia.  Patient denies any shortness of breath but does have pain when she takes a deep breath.  She says it is tender under her left breast with palpation.  Patient denies any other injury with this particular fall.  She is being treated for chronic pain with clonazepam and oxycodone.  She says she has enough to control the pain.     Past Medical History:  Diagnosis Date  . Allergic rhinitis   . Allergic to cats    pet dander  . Anxiety   . Arthritis 09/2018   both feet  . Asthma   . Cervicalgia   . Depression   . Environmental allergies   . Fibromyalgia   . GERD (gastroesophageal reflux disease)   . Grave's disease   . Headache    migraines  . High cholesterol   . Hypertension   . Panic disorder   . Risk for falls   . Sciatica   . Severe 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 ([place])   . Syncope and collapse   . Thyroid disease   . Vasovagal syncope   . Vertigo     Patient Active Problem List   Diagnosis Date Noted  . Leg weakness 03/21/2018  . Intractable chronic migraine without aura and without status migrainosus 04/27/2016  . Intractable chronic migraine without aura and with status migrainosus  01/14/2016  . Risk for falls 09/02/2015  . Insomnia 05/27/2015  . Bipolar 2 disorder ([place]) 02/25/2015  . Severe depressed bipolar II disorder without psychotic features ([place]) 02/25/2015  . Non morbid obesity 11/14/2014  . Severe 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 ([place]) 10/10/2014  . GAD (generalized anxiety disorder) 10/10/2014  . Panic disorder with agoraphobia 10/10/2014  . Social anxiety disorder 10/10/2014  . Other complicated headache syndrome 09/24/2014  . Jaw pain-acute TMJ 09/24/14 09/24/2014  . Low back pain 07/24/2014  . Encounter for monitoring opioid maintenance therapy 07/02/2014  . Pharyngoesophageal dysphagia 06/24/2014  . S/P gastric bypass 06/24/2014  . Status post bariatric surgery 06/24/2014  . Protein-calorie malnutrition, severe ([place]) 11/03/2012  . Severe malnutrition ([place]) 11/03/2012  . Lethargy 11/01/2012  . Drug overdose, multiple drugs 11/01/2012  . Hypokalemia 11/01/2012  . Migraine 05/22/2012  . Depression 11/22/2011  . Chronic pain 11/22/2011  . Graves' disease   . Asthma   . GERD (gastroesophageal reflux disease)   . Need for prophylactic postmenopausal hormone replacement therapy 11/03/2011  . Allergic rhinitis 09/28/2011  . Anemia 09/28/2011  . Essential hypertension 09/28/2011  . Memory loss 09/28/2011    Past Surgical History:  Procedure Laterality Date  . ABDOMINAL HYSTERECTOMY  2006   partial  .  GASTRIC BYPASS  2008    OB History    Gravida  2   Para  2   Term      Preterm      AB      Living  2     SAB      TAB      Ectopic      Multiple      Live Births               Home Medications    Prior to Admission medications   Medication Sig Start Date End Date Taking? Authorizing Provider  albuterol (PROVENTIL HFA;VENTOLIN HFA) 108 (90 BASE) MCG/ACT inhaler Inhale 2 puffs into the lungs every 6 (six) hours as needed for wheezing.    [provider]  azelastine (ASTELIN) 137  MCG/SPRAY nasal spray Place 1 spray into the nose 2 (two) times daily. Use in each nostril as directed    [provider]  BIOTIN 5000 PO Take 5,000 Units by mouth daily.     [provider]  budesonide-formoterol (SYMBICORT) 80-4.5 MCG/ACT inhaler Inhale 2 puffs into the lungs 2 (two) times daily.    [provider]  cetirizine (ZYRTEC) 10 MG tablet Take 10 mg by mouth daily.    [provider]  clonazePAM (KLONOPIN) 0.5 MG tablet Take 1 tablet (0.5 mg total) by mouth 3 (three) times daily as needed. for anxiety 02/15/19   Oletta Darter, MD  cyclobenzaprine (FLEXERIL) 10 MG tablet Take 10 mg by mouth 2 (two) times daily as needed. msucle spasms 02/01/18   [provider]  diclofenac sodium (VOLTAREN) 1 % GEL Apply 2 g topically 4 (four) times daily. 12/12/18   Vivi Barrack, DPM  diphenhydrAMINE HCl, Sleep, 50 MG CAPS Take 150 mg by mouth at bedtime.    [provider]  divalproex (DEPAKOTE ER) 250 MG 24 hr tablet Take 1 tablet (250 mg total) by mouth daily. Patient not taking: Reported on 03/14/2019 02/15/19   Oletta Darter, MD  doxepin [facility]) 10 MG/ML solution Take 5 mg or 10 mg at night to help with sleep, that is 0.5 ml or max of 1 ml 02/15/19   Oletta Darter, MD  Epinastine HCl (ELESTAT) 0.05 % ophthalmic solution Place 1 drop into both eyes 2 (two) times daily as needed (itching).     [provider]  EPINEPHrine (EPIPEN) 0.3 mg/0.3 mL DEVI Inject 0.3 mg into the muscle as needed. For allergic reaction to stinging insects, mushrooms, and shellfish     [provider]  estradiol (ESTRACE) 0.5 MG tablet Take 0.5 mg by mouth daily.    [provider]  fexofenadine-pseudoephedrine (ALLEGRA-D) 60-120 MG 12 hr tablet Take by mouth. 07/11/18   [provider]  fluconazole (DIFLUCAN) 150 MG tablet TAKE ONE TABLET BY MOUTH AS A ONE TIME DOSE 08/14/18   [provider]  fluticasone (FLONASE) 50  MCG/ACT nasal spray Place 1 spray into both nostrils as needed (allergies/runny nose).     [provider]  gabapentin (NEURONTIN) 300 MG capsule Take 600 mg by mouth 2 (two) times daily.     [provider]  ibuprofen (ADVIL) 600 MG tablet Take 1 tablet (600 mg total) by mouth every 6 (six) hours as needed. 01/09/19   Wieters, Hallie C, PA-C  Menthol-Methyl Salicylate (BEN GAY GREASELESS) 10-15 % greaseless cream Apply 1 application topically 3 (three) times daily as needed for pain.    [provider]  mometasone-formoterol (DULERA) 100-5 MCG/ACT AERO Inhale 2 puffs into the lungs 2 (two) times daily as needed for wheezing or shortness of breath.     [provider]  montelukast (SINGULAIR) 10 MG tablet Take 1 tablet (10 mg total) by mouth at bedtime. 02/13/15   Danelle Berry, PA-C  Multiple Vitamins-Minerals (MULTIVITAMIN WITH MINERALS) tablet Take 1 tablet by mouth daily.    [provider]  naloxegol oxalate (MOVANTIK) 25 MG TABS tablet Take 25 mg by mouth daily.     [provider]  naloxone [facility]) nasal spray 4 mg/0.1 mL  06/19/18   [provider]  Oxycodone HCl 10 MG TABS Take 10 mg by mouth 3 (three) times daily as needed.  03/17/18   [provider]  oxymetazoline (AFRIN NASAL SPRAY) 0.05 % nasal spray Place 1 spray into both nostrils 2 (two) times daily. Patient taking differently: Place 1 spray into both nostrils 2 (two) times daily as needed for congestion.  07/07/15   Barrett Henle, PA-C  pantoprazole (PROTONIX) 40 MG tablet Take by mouth. 08/14/18   [provider]  polyethylene glycol (MIRALAX / GLYCOLAX) packet Take 17 g by mouth every other day.     [provider]  prazosin (MINIPRESS) 1 MG capsule Take 1 capsule (1 mg total) by mouth at bedtime. 02/15/19   Oletta Darter, MD  ranitidine (ZANTAC) 150 MG tablet Take 150 mg by mouth daily.    [provider]  RELISTOR 150 MG  TABS  07/25/18   [provider]  SUMAtriptan (IMITREX) 100 MG tablet Take 1 tablet (100 mg total) by mouth once as needed for up to 1 dose for migraine. May repeat in 2 hours if headache persists or recurs. Patient not taking: Reported on 03/14/2019 04/12/17   Anson Fret, MD  triamcinolone cream (KENALOG) 0.1 % Apply topically. 07/11/18 01/22/20  [provider]  vitamin B-12 (CYANOCOBALAMIN) 100 MCG tablet Take 100 mcg by mouth daily.    [provider]  Vitamin D, Ergocalciferol, (DRISDOL) 1.25 MG (50000 UT) CAPS capsule Take 1 capsule (50,000 Units total) by mouth every 7 (seven) days. 11/08/18   Anson Fret, MD    Family History Family History  Problem Relation Age of Onset  . Hypertension Mother   . Cirrhosis Mother   . Alcohol abuse Mother   . Depression Mother   . Physical abuse Mother   . Hypertension Sister   . Alcohol abuse Sister   . Drug abuse Sister   . Depression Sister   . Anxiety disorder Sister   . OCD Sister   . Hypertension Father   . Alcohol abuse Father   . ADD / ADHD Other   . Depression Sister   . Diabetes Other   . Hypertension Other   . Diabetes Maternal Uncle   . Seizures Cousin   . Dementia Neg Hx     Social History Social History   Tobacco Use  . Smoking status: Never Smoker  . Smokeless tobacco: Never Used  Substance Use Topics  . Alcohol use: Yes    Alcohol/week: 1.0 standard drinks    Types: 1 Glasses of wine per week    Comment: one glass of wine every 3 months  . Drug use: No     Allergies   Bee venom, Coconut fatty acids, Mushroom ext cmplx(shiitake-reishi-mait), Nutritional supplements, Other, Shellfish allergy, Strawberry extract, and Hydrocodone-acetaminophen   Review of Systems Review of Systems  Respiratory: Negative for cough  and shortness of breath.   Cardiovascular: Positive for chest pain.  Neurological: Positive for dizziness and syncope.  All other systems reviewed and are negative.     Physical Exam Triage Vital Signs ED Triage Vitals  Enc Vitals Group     BP 03/19/19 1532 126/80     Pulse Rate 03/19/19 1532 85     Resp 03/19/19 1532 16     Temp 03/19/19 1532 (!) 97.2 F (36.2 C)     Temp Source 03/19/19 1532 Temporal     SpO2 03/19/19 1532 100 %     Weight --      Height --      Head Circumference --      Peak Flow --      Pain Score 03/19/19 1529 10     Pain Loc --      Pain Edu? --      Excl. in GC? --    No data found.  Updated Vital Signs BP 126/80 (BP Location: Right Arm)   Pulse 85   Temp (!) 97.2 F (36.2 C) (Temporal)   Resp 16   SpO2 100%   Physical Exam Vitals signs and nursing note reviewed.  Constitutional:      General: She is not in acute distress.    Appearance: Normal appearance. She is obese. She is not ill-appearing.  HENT:     Head: Normocephalic.  Eyes:     Conjunctiva/sclera: Conjunctivae normal.  Neck:     Musculoskeletal: Normal range of motion and neck supple.  Cardiovascular:     Rate and Rhythm: Normal rate and regular rhythm.     Heart sounds: Normal heart sounds.  Pulmonary:     Effort: Pulmonary effort is normal.     Breath sounds: Normal breath sounds.     Comments: Tender left 5-8 ribs antero-lateral chest.  No ecchymosis Musculoskeletal: Normal range of motion.  Skin:    General: Skin is warm and dry.  Neurological:     General: No focal deficit present.     Mental Status: She is alert and oriented to person, place, and time.  Psychiatric:        Mood and Affect: Mood normal.        Behavior: Behavior normal.        Thought Content: Thought content normal.      UC Treatments / Results  Labs (all labs ordered are listed, but only abnormal results are displayed) Labs Reviewed - No data to display  EKG   Radiology No results found.  Procedures Procedures (including critical care time)  Medications Ordered in UC Medications - No data to display  Initial Impression / Assessment and  Plan / UC Course  I have reviewed the triage vital signs and the nursing notes.  Pertinent labs & imaging results that were available during my care of the patient were reviewed by me and considered in my medical decision making (see chart for details).    Final Clinical Impressions(s) / UC Diagnoses   Final diagnoses:  Chest wall contusion, left, initial encounter     Discharge Instructions     X-rays do not show a fracture of the ribs.  Instead, the rib has been bruised and usually this takes 1 to 2 weeks to resolve.    ED Prescriptions    None     I have reviewed the PDMP during this encounter.   Elvina SidleLauenstein, Karis Rilling, MD 03/19/19 [phone]

## 2019-03-19 NOTE — ED Triage Notes (Signed)
Patient presents to Urgent Care with complaints of LOC 6 days ago, pt states she did not seek medical care because she has syncopal vertigo and "I know there is nothing they can do". Patient reports she fell on her left side and her rib cage still hurts, pain is worse w/ deep breaths and she cannot lay on her left side.

## 2019-03-19 NOTE — Discharge Instructions (Addendum)
X-rays do not show a fracture of the ribs.  Instead, the rib has been bruised and usually this takes 1 to 2 weeks to resolve.

## 2019-03-27 ENCOUNTER — Other Ambulatory Visit: Payer: Self-pay

## 2019-03-27 ENCOUNTER — Ambulatory Visit (INDEPENDENT_AMBULATORY_CARE_PROVIDER_SITE_OTHER): Payer: Medicare HMO | Admitting: Family Medicine

## 2019-03-27 ENCOUNTER — Encounter (INDEPENDENT_AMBULATORY_CARE_PROVIDER_SITE_OTHER): Payer: Self-pay | Admitting: Family Medicine

## 2019-03-27 VITALS — BP 125/82 | HR 86 | Temp 98.0°F | Ht 65.0 in | Wt 250.0 lb

## 2019-03-27 DIAGNOSIS — R0602 Shortness of breath: Secondary | ICD-10-CM | POA: Diagnosis not present

## 2019-03-27 DIAGNOSIS — Z6841 Body Mass Index (BMI) 40.0 and over, adult: Secondary | ICD-10-CM | POA: Diagnosis not present

## 2019-03-27 DIAGNOSIS — R739 Hyperglycemia, unspecified: Secondary | ICD-10-CM | POA: Diagnosis not present

## 2019-03-27 DIAGNOSIS — E559 Vitamin D deficiency, unspecified: Secondary | ICD-10-CM

## 2019-03-27 DIAGNOSIS — R5383 Other fatigue: Secondary | ICD-10-CM

## 2019-03-27 DIAGNOSIS — Z0289 Encounter for other administrative examinations: Secondary | ICD-10-CM

## 2019-03-27 DIAGNOSIS — Z1331 Encounter for screening for depression: Secondary | ICD-10-CM

## 2019-03-28 LAB — INSULIN, RANDOM: INSULIN: 7.9 u[IU]/mL (ref 2.6–24.9)

## 2019-03-28 LAB — VITAMIN D 25 HYDROXY (VIT D DEFICIENCY, FRACTURES): Vit D, 25-Hydroxy: 17.7 ng/mL — ABNORMAL LOW (ref 30.0–100.0)

## 2019-03-28 LAB — CBC WITH DIFFERENTIAL/PLATELET
Basophils Absolute: 0 10*3/uL (ref 0.0–0.2)
Basos: 1 %
EOS (ABSOLUTE): 0.2 10*3/uL (ref 0.0–0.4)
Eos: 3 %
Hematocrit: 35.4 % (ref 34.0–46.6)
Hemoglobin: 11.6 g/dL (ref 11.1–15.9)
Immature Grans (Abs): 0 10*3/uL (ref 0.0–0.1)
Immature Granulocytes: 0 %
Lymphocytes Absolute: 1.4 10*3/uL (ref 0.7–3.1)
Lymphs: 23 %
MCH: 27.3 pg (ref 26.6–33.0)
MCHC: 32.8 g/dL (ref 31.5–35.7)
MCV: 83 fL (ref 79–97)
Monocytes Absolute: 0.8 10*3/uL (ref 0.1–0.9)
Monocytes: 13 %
Neutrophils Absolute: 3.7 10*3/uL (ref 1.4–7.0)
Neutrophils: 60 %
Platelets: 268 10*3/uL (ref 150–450)
RBC: 4.25 x10E6/uL (ref 3.77–5.28)
RDW: 14.1 % (ref 11.7–15.4)
WBC: 6.2 10*3/uL (ref 3.4–10.8)

## 2019-03-28 LAB — COMPREHENSIVE METABOLIC PANEL
ALT: 19 IU/L (ref 0–32)
AST: 18 IU/L (ref 0–40)
Albumin/Globulin Ratio: 1.5 (ref 1.2–2.2)
Albumin: 4.2 g/dL (ref 3.8–4.9)
Alkaline Phosphatase: 81 IU/L (ref 39–117)
BUN/Creatinine Ratio: 18 (ref 9–23)
BUN: 14 mg/dL (ref 6–24)
Bilirubin Total: <0.2 mg/dL (ref 0.0–1.2)
CO2: 24 mmol/L (ref 20–29)
Calcium: 9.2 mg/dL (ref 8.7–10.2)
Chloride: 105 mmol/L (ref 96–106)
Creatinine, Ser: 0.8 mg/dL (ref 0.57–1.00)
GFR calc Af Amer: 96 mL/min/{1.73_m2} (ref >59)
GFR calc non Af Amer: 83 mL/min/{1.73_m2} (ref >59)
Globulin, Total: 2.8 g/dL (ref 1.5–4.5)
Glucose: 83 mg/dL (ref 65–99)
Potassium: 3.9 mmol/L (ref 3.5–5.2)
Sodium: 143 mmol/L (ref 134–144)
Total Protein: 7 g/dL (ref 6.0–8.5)

## 2019-03-28 LAB — HEMOGLOBIN A1C
Est. average glucose Bld gHb Est-mCnc: 123 mg/dL
Hgb A1c MFr Bld: 5.9 % — ABNORMAL HIGH (ref 4.8–5.6)

## 2019-03-28 LAB — LIPID PANEL WITH LDL/HDL RATIO
Cholesterol, Total: 187 mg/dL (ref 100–199)
HDL: 101 mg/dL (ref >39)
LDL Chol Calc (NIH): 68 mg/dL (ref 0–99)
LDL/HDL Ratio: 0.7 ratio (ref 0.0–3.2)
Triglycerides: 102 mg/dL (ref 0–149)
VLDL Cholesterol Cal: 18 mg/dL (ref 5–40)

## 2019-03-28 LAB — VITAMIN B12: Vitamin B-12: >2000 pg/mL — ABNORMAL HIGH (ref 232–1245)

## 2019-03-28 LAB — TSH: TSH: 4.78 u[IU]/mL — ABNORMAL HIGH (ref 0.450–4.500)

## 2019-03-28 LAB — FOLATE: Folate: 18.6 ng/mL (ref >3.0)

## 2019-03-28 LAB — T4, FREE: Free T4: 1.02 ng/dL (ref 0.82–1.77)

## 2019-03-28 LAB — T3: T3, Total: 150 ng/dL (ref 71–180)

## 2019-03-28 NOTE — Progress Notes (Signed)
Office: [phone]  /  Fax: [phone]   Dear Dr. Lucia [name],   Thank you for referring [name] to our clinic. The following note includes my evaluation and treatment recommendations.  HPI:   Chief Complaint: OBESITY    [name] has been referred by Colleen MaximAntonia B. Colleen GaskinsAhern, MD for consultation regarding her obesity and obesity related comorbidities.    [name] (MR# [id number]) is a 53 y.o. female who presents on 03/27/2019 for obesity evaluation and treatment. Current BMI is Body mass index is 41.6 kg/m.Marland Kitchen. Patient has a long health history involving pain issues and anxiety, and depression is making weight loss especially difficult. She states she eats very little and skips meals frequently. [name] has been struggling with her weight for many years and has been unsuccessful in either losing weight, maintaining weight loss, or reaching her healthy weight goal.     [name] attended our information session and states she is currently in the action stage of change and ready to dedicate time achieving and maintaining a healthier weight. [name] is interested in becoming our patient and working on intensive lifestyle modifications including (but not limited to) diet, exercise and weight loss.    [name] states her desired weight loss is 80 lbs. she started gaining weight in 2008 her heaviest weight ever was 290 lbs. she is a picky eater and doesn't like to eat healthier foods  she has significant food cravings issues  she snacks frequently in the evenings she skips meals frequently she is trying to eat vegetarian she is frequently drinking liquids with calories she sometimes makes poor food choices she has binge eating behaviors she struggles with emotional eating   Status Post Gastric Bypass in 2008 [name] is status post gastric bypass surgery in 2008. Her pre-surgical weight was 293 pounds and she lost down to 187 pounds in five months. Her re-gain of weight started  approximately eight years later and she is now at 250 pounds.   Fatigue [name] feels her energy is lower than it should be. This has worsened with weight gain and has not worsened recently. [name] admits to daytime somnolence and she admits to waking up still tired. Patient is at risk for obstructive sleep apnea. Patent has a history of symptoms of daytime fatigue, morning fatigue and morning headache. Patient generally gets 4 or 5 hours of sleep per night, and states they generally have restless sleep. Snoring is present. Apneic episodes are present. Epworth Sleepiness Score is 12  Dyspnea on exertion [name] notes increasing shortness of breath with exercising and seems to be worsening over time with weight gain. She notes getting out of breath sooner with activity than she used to. This has not gotten worse recently. [name] denies orthopnea.  Hyperglycemia [name] has a history of some elevated fasting blood glucose readings without a diagnosis of diabetes. There is no recent Hgb A1c. She denies polyphagia.  Vitamin D deficiency [name] has a history of vitamin D deficiency. She is not currently taking vit D. [name] admits fatigue and denies nausea, vomiting or muscle weakness.  Depression Screen [name]'s Food and Mood (modified PHQ-9) score was  Depression screen PHQ 2/9 03/27/2019  Decreased Interest 3  Down, Depressed, Hopeless 3  PHQ - 2 Score 6  Altered sleeping 3  Tired, decreased energy 3  Change in appetite 3  Feeling bad or failure about yourself  3  Trouble concentrating 3  Moving slowly or fidgety/restless 3  Suicidal thoughts 2  PHQ-9 Score 26  Difficult doing work/chores Extremely dIfficult  Some recent data might be hidden    ASSESSMENT AND PLAN:  Other fatigue - Plan: EKG 12-Lead, Vitamin B12, CBC with Differential/Platelet, Folate, T3, T4, free, TSH  Shortness of breath on exertion - Plan: Lipid Panel With LDL/HDL Ratio  Vitamin D deficiency - Plan:  VITAMIN D 25 Hydroxy (Vit-D Deficiency, Fractures)  Hyperglycemia - Plan: Comprehensive metabolic panel, Hemoglobin A1c, Insulin, random  Depression screening  Class 3 severe obesity with serious comorbidity and body mass index (BMI) of 40.0 to 44.9 in adult, unspecified obesity type (HCC)  PLAN:  Fatigue [name] was informed that her fatigue may be related to obesity, depression or many other causes. Labs will be ordered, and in the meanwhile [name] has agreed to work on diet, exercise and weight loss to help with fatigue. Proper sleep hygiene was discussed including the need for 7-8 hours of quality sleep each night. A sleep study was not ordered based on symptoms and Epworth score.  Dyspnea on exertion [name]'s shortness of breath appears to be obesity related and exercise induced. She has agreed to work on weight loss and gradually increase exercise to treat her exercise induced shortness of breath. If [name] follows our instructions and loses weight without improvement of her shortness of breath, we will plan to refer to pulmonology. We will monitor this condition regularly. [name] agrees to this plan.  Hyperglycemia Fasting labs will be obtained and results with be discussed with [name] in 2 weeks at her follow up visit. In the meanwhile [name] was started on a lower simple carbohydrate diet and will work on weight loss efforts.  Vitamin D Deficiency [name] was informed that low vitamin D levels contributes to fatigue and are associated with obesity, breast, and colon cancer. We will check labs and she will follow up for routine testing of vitamin D, at least 2-3 times per year. She was informed of the risk of over-replacement of vitamin D and agrees to not increase her dose unless she discusses this with [name] first.  Depression Screen [name] had a strongly positive depression screening. Depression is commonly associated with obesity and often results in emotional eating  behaviors. We will monitor this closely and work on CBT to help improve the non-hunger eating patterns. Referral to Psychology may be required if no improvement is seen as she continues in our clinic.  Obesity Calliegh is currently in the action stage of change and her goal is to continue with weight loss efforts. I recommend Delaine begin the structured treatment plan as follows:  She has agreed to keep a food journal with 1100 to 1300 calories and 75+ grams of protein daily  Kileen has been instructed to eventually work up to a goal of 150 minutes of combined cardio and strengthening exercise per week for weight loss and overall health benefits. We discussed the following Behavioral Modification Strategies today: increasing lean protein intake, decreasing simple carbohydrates , increasing vegetables and work on meal planning and easy cooking plans   She was informed of the importance of frequent follow up visits to maximize her success with intensive lifestyle modifications for her multiple health conditions. She was informed we would discuss her lab results at her next visit unless there is a critical issue that needs to be addressed sooner. Marijah agreed to keep her next visit at the agreed upon time to discuss these results.  ALLERGIES: Allergies  Allergen Reactions   Bee Venom Anaphylaxis    Other reaction(s): PRURITUS, RASH  Coconut Fatty Acids Anaphylaxis    Patient allergic to coconut in general   Mushroom Ext Cmplx(Shiitake-Reishi-Mait) Anaphylaxis   Nutritional Supplements Anaphylaxis and Itching    walnuts   Other Anaphylaxis, Hives and Itching    Ragweed   Shellfish Allergy Anaphylaxis   Strawberry Extract Anaphylaxis   Hydrocodone-Acetaminophen Itching    MEDICATIONS: Current Outpatient Medications on File Prior to Visit  Medication Sig Dispense Refill   albuterol (PROVENTIL HFA;VENTOLIN HFA) 108 (90 BASE) MCG/ACT inhaler Inhale 2 puffs into the lungs every 6  (six) hours as needed for wheezing.     BIOTIN 5000 PO Take 5,000 Units by mouth daily.      budesonide-formoterol (SYMBICORT) 80-4.5 MCG/ACT inhaler Inhale 2 puffs into the lungs 2 (two) times daily.     clonazePAM (KLONOPIN) 0.5 MG tablet Take 1 tablet (0.5 mg total) by mouth 3 (three) times daily as needed. for anxiety 90 tablet 1   diclofenac sodium (VOLTAREN) 1 % GEL Apply 2 g topically 4 (four) times daily. 100 g 1   DULoxetine (CYMBALTA) 30 MG capsule Take 30 mg by mouth daily.     EPINEPHrine (EPIPEN) 0.3 mg/0.3 mL DEVI Inject 0.3 mg into the muscle as needed. For allergic reaction to stinging insects, mushrooms, and shellfish      fluticasone (FLONASE) 50 MCG/ACT nasal spray Place 1 spray into both nostrils as needed (allergies/runny nose).      gabapentin (NEURONTIN) 300 MG capsule Take 600 mg by mouth 2 (two) times daily.      Oxycodone HCl 10 MG TABS Take 10 mg by mouth 3 (three) times daily as needed.      No current facility-administered medications on file prior to visit.     PAST MEDICAL HISTORY: Past Medical History:  Diagnosis Date   Allergic rhinitis    Allergic to cats    pet dander   Anxiety    Arthritis 09/2018   both feet   Asthma    Back pain    Bipolar disorder (HCC)    Cervicalgia    Chest pain    Chronic fatigue syndrome    Constipation    Depression    Dyspnea    Environmental allergies    Fibromyalgia    GERD (gastroesophageal reflux disease)    Grave's disease    Headache    migraines   High cholesterol    History of blood clots    Hypertension    Hypothyroidism    Joint pain    Lactose intolerance    Lower extremity edema    Multiple food allergies    OSA (obstructive sleep apnea)    Palpitations    Panic disorder    Rheumatoid arthritis (HCC)    Risk for falls    Sciatica    Severe 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 (HCC)    Syncope and collapse    Thyroid disease     Vasovagal syncope    Vertigo    Vitamin D deficiency     PAST SURGICAL HISTORY: Past Surgical History:  Procedure Laterality Date   ABDOMINAL HYSTERECTOMY  2006   partial   GASTRIC BYPASS  2008    SOCIAL HISTORY: Social History   Tobacco Use   Smoking status: Never Smoker   Smokeless tobacco: Never Used  Substance Use Topics   Alcohol use: Yes    Alcohol/week: 1.0 standard drinks    Types: 1 Glasses of wine per week    Comment: one glass of  wine every 3 months   Drug use: No    FAMILY HISTORY: Family History  Problem Relation Age of Onset   Hypertension Mother    Cirrhosis Mother    Alcohol abuse Mother    Depression Mother    Physical abuse Mother    Hyperlipidemia Mother    Thyroid disease Mother    Anxiety disorder Mother    Hypertension Sister    Alcohol abuse Sister    Drug abuse Sister    Depression Sister    Anxiety disorder Sister    OCD Sister    Hypertension Father    Alcohol abuse Father    Hyperlipidemia Father    Depression Father    Drug abuse Father    ADD / ADHD Other    Depression Sister    Diabetes Other    Hypertension Other    Diabetes Maternal Uncle    Seizures Cousin    Dementia Neg Hx     ROS: Review of Systems  Constitutional: Positive for malaise/fatigue and weight loss.  HENT: Positive for sinus pain and tinnitus.        Positive for Dry Mouth  Eyes:       Positive for Wear Glasses or Contacts  Respiratory: Positive for cough, shortness of breath and wheezing.   Cardiovascular: Positive for palpitations. Negative for orthopnea.       Positive for Shortness of Breath with Activity Positive for Sudden Awakening from Sleep with Shortness of Breath Positive for Calf/Leg Pain with Walking Positive for Leg Cramping Positive for Very Cold Feet or Hands  Gastrointestinal: Positive for constipation, heartburn and nausea. Negative for vomiting.  Musculoskeletal: Positive for back pain.        Positive for Muscle or Joint Pain Positive for Muscle Stiffness Positive for Red or Swollen Joints  Skin: Positive for itching.       Positive for Dryness Positive for Hair or Nail Changes  Neurological: Positive for dizziness, weakness and headaches.       Positive for Head Injury  Endo/Heme/Allergies: Bruises/bleeds easily.       Positive for Heat or Cold Intolerance Negative for polyphagia  Psychiatric/Behavioral: Positive for depression. The patient is nervous/anxious and has insomnia.        Positive for Stress    PHYSICAL EXAM: Blood pressure 125/82, pulse 86, temperature 98 F (36.7 C), temperature source Oral, height 5\' 5"  (1.651 m), weight 250 lb (113.4 kg), SpO2 97 %. Body mass index is 41.6 kg/m. Physical Exam Vitals signs reviewed.  Constitutional:      Appearance: Normal appearance. She is well-developed. She is obese.  HENT:     Head: Normocephalic and atraumatic.     Nose: Nose normal.  Eyes:     General: No scleral icterus.    Extraocular Movements: Extraocular movements intact.  Neck:     Musculoskeletal: Normal range of motion and neck supple.     Thyroid: No thyromegaly.  Cardiovascular:     Rate and Rhythm: Normal rate and regular rhythm.  Pulmonary:     Effort: Pulmonary effort is normal. No respiratory distress.  Abdominal:     Palpations: Abdomen is soft.     Tenderness: There is no abdominal tenderness.  Musculoskeletal: Normal range of motion.     Comments: Range of Motion normal in all 4 extremities  Skin:    General: Skin is warm and dry.  Neurological:     Mental Status: She is alert and oriented to person, place, and  time.     Coordination: Coordination normal.  Psychiatric:        Mood and Affect: Mood normal.        Behavior: Behavior normal.     RECENT LABS AND TESTS: BMET    Component Value Date/Time   NA 143 03/27/2019 1028   K 3.9 03/27/2019 1028   CL 105 03/27/2019 1028   CO2 24 03/27/2019 1028   GLUCOSE 83 03/27/2019  1028   GLUCOSE 104 (H) 09/16/2018 1433   BUN 14 03/27/2019 1028   CREATININE 0.80 03/27/2019 1028   CREATININE 0.85 06/12/2014 2031   CALCIUM 9.2 03/27/2019 1028   GFRNONAA 83 03/27/2019 1028   GFRAA 96 03/27/2019 1028   Lab Results  Component Value Date   HGBA1C 5.9 (H) 03/27/2019   Lab Results  Component Value Date   INSULIN 7.9 03/27/2019   CBC    Component Value Date/Time   WBC 6.2 03/27/2019 1028   WBC 7.0 09/16/2018 1433   RBC 4.25 03/27/2019 1028   RBC 4.63 09/16/2018 1433   HGB 11.6 03/27/2019 1028   HCT 35.4 03/27/2019 1028   PLT 268 03/27/2019 1028   MCV 83 03/27/2019 1028   MCH 27.3 03/27/2019 1028   MCH 27.6 09/16/2018 1433   MCHC 32.8 03/27/2019 1028   MCHC 30.2 09/16/2018 1433   RDW 14.1 03/27/2019 1028   LYMPHSABS 1.4 03/27/2019 1028   MONOABS 1.0 09/16/2018 1433   EOSABS 0.2 03/27/2019 1028   BASOSABS 0.0 03/27/2019 1028   Iron/TIBC/Ferritin/ %Sat No results found for: IRON, TIBC, FERRITIN, IRONPCTSAT Lipid Panel     Component Value Date/Time   CHOL 187 03/27/2019 1028   TRIG 102 03/27/2019 1028   HDL 101 03/27/2019 1028   LDLCALC 68 03/27/2019 1028   Hepatic Function Panel     Component Value Date/Time   PROT 7.0 03/27/2019 1028   ALBUMIN 4.2 03/27/2019 1028   AST 18 03/27/2019 1028   ALT 19 03/27/2019 1028   ALKPHOS 81 03/27/2019 1028   BILITOT <0.2 03/27/2019 1028   BILIDIR <0.1 05/05/2011 2129   IBILI NOT CALCULATED 05/05/2011 2129      Component Value Date/Time   TSH 4.780 (H) 03/27/2019 1028   TSH 0.930 11/07/2018 1518   TSH 1.398 05/05/2011 2129   Vitamin D  Ref. Range 11/07/2018 15:18  Vitamin D, 25-Hydroxy Latest Ref Range: 30.0 - 100.0 ng/mL 14.8 (L)    ECG  shows NSR with a rate of 85 BPM INDIRECT CALORIMETER done today shows a VO2 of 296 and a REE of 2063.  Her calculated basal metabolic rate is [id number] thus her basal metabolic rate is better than expected.       OBESITY BEHAVIORAL INTERVENTION VISIT  Today's  visit was # 1   Starting weight: 250 lbs Starting date: 03/27/2019 Today's weight : 250 lbs Today's date: 03/27/2019 Total lbs lost to date: 0    03/27/2019  Height 5\' 5"  (1.651 m)  Weight 250 lb (113.4 kg)  BMI (Calculated) 41.6  BLOOD PRESSURE - SYSTOLIC 125  BLOOD PRESSURE - DIASTOLIC 82  Waist Measurement  45 inches   Body Fat % 52.7 %  Total Body Water (lbs) 88.2 lbs  RMR 2063    ASK: We discussed the diagnosis of obesity with Colleen Bartersheresa A Doublin today and [name] agreed to give us permission to discuss obesity behavioral modification therapy today.  ASSESS: [name] has the diagnosis of obesity and her BMI today is 41.6 [name] is in the  action stage of change   ADVISE: Syrena was educated on the multiple health risks of obesity as well as the benefit of weight loss to improve her health. She was advised of the need for long term treatment and the importance of lifestyle modifications to improve her current health and to decrease her risk of future health problems.  AGREE: Multiple dietary modification options and treatment options were discussed and  Shreena agreed to follow the recommendations documented in the above note.  ARRANGE: Dominik was educated on the importance of frequent visits to treat obesity as outlined per CMS and USPSTF guidelines and agreed to schedule her next follow up appointment today.  I, Doreene Nest, am acting as transcriptionist for Dennard Nip, MD  I have reviewed the above documentation for accuracy and completeness, and I agree with the above. -Dennard Nip, MD

## 2019-03-29 ENCOUNTER — Ambulatory Visit (INDEPENDENT_AMBULATORY_CARE_PROVIDER_SITE_OTHER): Payer: Medicare HMO | Admitting: Psychiatry

## 2019-03-29 ENCOUNTER — Encounter (HOSPITAL_COMMUNITY): Payer: Self-pay | Admitting: Psychiatry

## 2019-03-29 ENCOUNTER — Telehealth: Payer: Self-pay | Admitting: Neurology

## 2019-03-29 ENCOUNTER — Other Ambulatory Visit: Payer: Self-pay

## 2019-03-29 DIAGNOSIS — F411 Generalized anxiety disorder: Secondary | ICD-10-CM | POA: Diagnosis not present

## 2019-03-29 DIAGNOSIS — G4700 Insomnia, unspecified: Secondary | ICD-10-CM | POA: Diagnosis not present

## 2019-03-29 DIAGNOSIS — F333 Major depressive disorder, recurrent, severe with psychotic symptoms: Secondary | ICD-10-CM | POA: Diagnosis not present

## 2019-03-29 DIAGNOSIS — F4001 Agoraphobia with panic disorder: Secondary | ICD-10-CM | POA: Diagnosis not present

## 2019-03-29 MED ORDER — DULOXETINE HCL 30 MG PO CPEP: 30.0000 mg | ORAL_CAPSULE | Freq: Every day | ORAL | 0 refills | Status: DC

## 2019-03-29 MED ORDER — DOXEPIN HCL 10 MG/ML PO CONC: 10.0000 mg | Freq: Every day | ORAL | 2 refills | Status: DC

## 2019-03-29 MED ORDER — DIVALPROEX SODIUM ER 250 MG PO TB24: 750.0000 mg | ORAL_TABLET | Freq: Every day | ORAL | 2 refills | Status: DC

## 2019-03-29 MED ORDER — CLONAZEPAM 0.5 MG PO TABS: 0.5000 mg | ORAL_TABLET | Freq: Three times a day (TID) | ORAL | 1 refills | Status: DC | PRN

## 2019-03-29 NOTE — Telephone Encounter (Signed)
Pt is wanting to discuss her Ajovy and her BOTOX with RN. Please advise.

## 2019-03-29 NOTE — Progress Notes (Signed)
Virtual Visit via Telephone Note  I connected with [name]  on 03/29/19 at  3:30 PM EST by telephone and verified that I am speaking with the correct person using two identifiers.  Location: Patient: home Provider: office   I discussed the limitations, risks, security and privacy concerns of performing an evaluation and management service by telephone and the availability of in person appointments. I also discussed with the patient that there may be a patient responsible charge related to this service. The patient expressed understanding and agreed to proceed.   History of Present Illness: Patient reports that she is not doing well.  She is extremely overwhelmed by all the things that are going on around her.  She states that her fianc is pushing to get married but she is unsure due to some of his behaviors.  She states that many of her friends are asking for help with various things and sometimes she wants to help and at the time she does not.  [name] is sleeping if she takes the doxepin and Klonopin at bedtime.  She states that she usually requires a refill of the doxepin every 20 to 25 days.  I expressed my concern and stated that she needed to take the medication as it was prescribed.  She is experiencing a lot of anxiety and states that it is difficult to shut her mind down off at night.  She is not really engaging in much self-care.  She does occasionally visit with some friends or go for a walk.  The depression and anxiety often cause her to feel irritable.  She is uncertain if the Depakote is helping or not.  She is denying any SI/HI.  Last week she felt that her depression was so overwhelming that she wanted to check herself for inpatient care.  She stopped because she did not want anyone to know that she was going to get treatment.  Other than irritability she is denying any manic or hypomanic-like symptoms.    Observations/Objective: I spoke with [name] on the phone.   Pt was calm, pleasant and cooperative.  Pt was engaged in the conversation and answered questions appropriately.  Speech was clear and coherent with normal rate and volume.  Tone is somewhat flat.  Mood is depressed and anxious, affect is congruent. Thought processes are coherent, goal oriented and intact.  Thought content is with ruminations.  Pt denies SI/HI.   Pt denies auditory and visual hallucinations and did not appear to be responding to internal stimuli.  Memory and concentration are good.  Fund of knowledge and use of language are average.  Insight and judgment are fair.  I am unable to comment on psychomotor activity, general appearance, hygiene, or eye contact as I was unable to physically see the patient on the phone.  Vital signs not available since interview conducted virtually.    I reviewed the information below on 03/29/2019 and have updated it Assessment and Plan: MDD-recurrent, severe with psychotic features vs Bipolar II d/o; GAD; Panic d/o with agoraphobia; social anxiety disorder; insomnia; cluster B traits  Status of current symptoms: Ongoing depression, irritability and anxiety     Increase Depakote ER 750mg  po qHS   Doxepin 10mg /ml- take 5-10mg  po qHS prn insomnia   Prazosin 1mg  po qHS for nightmares   Klonopin 0.5mg  po TID prn anxiety  Patient is reporting ongoing stressors with her relationships and resulting depression and anxiety.  I believe that patient would benefit from therapy  and encouraged her to follow through.  We discussed self-care in great detail.  Follow Up Instructions: In 12 weeks or sooner if needed   I discussed the assessment and treatment plan with the patient. The patient was provided an opportunity to ask questions and all were answered. The patient agreed with the plan and demonstrated an understanding of the instructions.   The patient was advised to call back or seek an in-person evaluation if the symptoms worsen or if the condition fails  to improve as anticipated.  I provided 30 minutes of non-face-to-face time during this encounter.   Charlcie Cradle, MD

## 2019-03-29 NOTE — Telephone Encounter (Signed)
I called the pt. She stated she was due for Ajovy on 11/4 and wanted to know if it was ok to go ahead and inject a dose. botox next week. I advised per Dr. Jaynee Eagles, ok to do the Ajovy. Pt verbalized appreciation.

## 2019-04-03 ENCOUNTER — Other Ambulatory Visit: Payer: Self-pay

## 2019-04-03 ENCOUNTER — Ambulatory Visit (INDEPENDENT_AMBULATORY_CARE_PROVIDER_SITE_OTHER): Payer: Medicare HMO | Admitting: Neurology

## 2019-04-03 VITALS — Temp 97.7°F

## 2019-04-03 DIAGNOSIS — G43711 Chronic migraine without aura, intractable, with status migrainosus: Secondary | ICD-10-CM

## 2019-04-03 NOTE — Telephone Encounter (Signed)
botox charge sheet completed, signed, and given to [place].

## 2019-04-03 NOTE — Progress Notes (Signed)
Consent Form Botulism Toxin Injection For Chronic Migraine  Interval history 04/03/2019: This is our first botox of this botox series.  Reviewed orally with patient, additionally signature is on file:  Botulism toxin has been approved by the Federal drug administration for treatment of chronic migraine. Botulism toxin does not cure chronic migraine and it may not be effective in some patients.  The administration of botulism toxin is accomplished by injecting a small amount of toxin into the muscles of the neck and head. Dosage must be titrated for each individual. Any benefits resulting from botulism toxin tend to wear off after 3 months with a repeat injection required if benefit is to be maintained. Injections are usually done every 3-4 months with maximum effect peak achieved by about 2 or 3 weeks. Botulism toxin is expensive and you should be sure of what costs you will incur resulting from the injection.  The side effects of botulism toxin use for chronic migraine may include:   -Transient, and usually mild, facial weakness with facial injections  -Transient, and usually mild, head or neck weakness with head/neck injections  -Reduction or loss of forehead facial animation due to forehead muscle weakness  -Eyelid drooping  -Dry eye  -Pain at the site of injection or bruising at the site of injection  -Double vision  -Potential unknown long term risks  Contraindications: You should not have Botox if you are pregnant, nursing, allergic to albumin, have an infection, skin condition, or muscle weakness at the site of the injection, or have myasthenia gravis, Lambert-Eaton syndrome, or ALS.  It is also possible that as with any injection, there may be an allergic reaction or no effect from the medication. Reduced effectiveness after repeated injections is sometimes seen and rarely infection at the injection site may occur. All care will be taken to prevent these side effects. If therapy is  given over a long time, atrophy and wasting in the muscle injected may occur. Occasionally the patient's become refractory to treatment because they develop antibodies to the toxin. In this event, therapy needs to be modified.  I have read the above information and consent to the administration of botulism toxin.    BOTOX PROCEDURE NOTE FOR MIGRAINE HEADACHE    Contraindications and precautions discussed with patient(above). Aseptic procedure was observed and patient tolerated procedure. Procedure performed by Dr. Georgia Dom  The condition has existed for more than 6 months, and pt does not have a diagnosis of ALS, Myasthenia Gravis or Lambert-Eaton Syndrome.  Risks and benefits of injections discussed and pt agrees to proceed with the procedure.  Written consent obtained  These injections are medically necessary. Pt  receives good benefits from these injections. These injections do not cause sedations or hallucinations which the oral therapies may cause.  Description of procedure:  The patient was placed in a sitting position. The standard protocol was used for Botox as follows, with 5 units of Botox injected at each site:   -Procerus muscle, midline injection  -Corrugator muscle, bilateral injection  -Frontalis muscle, bilateral injection, with 2 sites each side, medial injection was performed in the upper one third of the frontalis muscle, in the region vertical from the medial inferior edge of the superior orbital rim. The lateral injection was again in the upper one third of the forehead vertically above the lateral limbus of the cornea, 1.5 cm lateral to the medial injection site.  -Temporalis muscle injection, 4 sites, bilaterally. The first injection was 3 cm above the tragus  of the ear, second injection site was 1.5 cm to 3 cm up from the first injection site in line with the tragus of the ear. The third injection site was 1.5-3 cm forward between the first 2 injection sites. The  fourth injection site was 1.5 cm posterior to the second injection site.   -Occipitalis muscle injection, 3 sites, bilaterally. The first injection was done one half way between the occipital protuberance and the tip of the mastoid process behind the ear. The second injection site was done lateral and superior to the first, 1 fingerbreadth from the first injection. The third injection site was 1 fingerbreadth superiorly and medially from the first injection site.  -Cervical paraspinal muscle injection, 2 sites, bilateral knee first injection site was 1 cm from the midline of the cervical spine, 3 cm inferior to the lower border of the occipital protuberance. The second injection site was 1.5 cm superiorly and laterally to the first injection site.  -Trapezius muscle injection was performed at 3 sites, bilaterally. The first injection site was in the upper trapezius muscle halfway between the inflection point of the neck, and the acromion. The second injection site was one half way between the acromion and the first injection site. The third injection was done between the first injection site and the inflection point of the neck.   Will return for repeat injection in 3 months.   200 units of Botox was used, any Botox not injected was wasted. The patient tolerated the procedure well, there were no complications of the above procedure.

## 2019-04-03 NOTE — Progress Notes (Signed)
Botox- 200 units x 1 vial Lot: [id number] Expiration: 12/2021 NDC: 2542-7062-37  Bacteriostatic 0.9% Sodium Chloride- 42mL total Lot: [id number] Expiration: 11/22/2019 NDC: 1761-6073-71  Dx: G62.694 B/B

## 2019-04-05 ENCOUNTER — Ambulatory Visit (INDEPENDENT_AMBULATORY_CARE_PROVIDER_SITE_OTHER): Payer: Medicare HMO | Admitting: Psychiatry

## 2019-04-05 ENCOUNTER — Other Ambulatory Visit: Payer: Self-pay

## 2019-04-05 ENCOUNTER — Encounter (HOSPITAL_COMMUNITY): Payer: Self-pay | Admitting: Psychiatry

## 2019-04-05 DIAGNOSIS — F411 Generalized anxiety disorder: Secondary | ICD-10-CM

## 2019-04-05 DIAGNOSIS — F333 Major depressive disorder, recurrent, severe with psychotic symptoms: Secondary | ICD-10-CM | POA: Diagnosis not present
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
## 2019-04-10 ENCOUNTER — Encounter (INDEPENDENT_AMBULATORY_CARE_PROVIDER_SITE_OTHER): Payer: Self-pay | Admitting: Family Medicine

## 2019-04-10 ENCOUNTER — Ambulatory Visit (INDEPENDENT_AMBULATORY_CARE_PROVIDER_SITE_OTHER): Payer: Medicare HMO | Admitting: Family Medicine

## 2019-04-10 ENCOUNTER — Other Ambulatory Visit: Payer: Self-pay

## 2019-04-10 VITALS — BP 121/79 | HR 77 | Temp 98.1°F | Ht 65.0 in | Wt 247.0 lb

## 2019-04-10 DIAGNOSIS — Z6841 Body Mass Index (BMI) 40.0 and over, adult: Secondary | ICD-10-CM

## 2019-04-10 DIAGNOSIS — F339 Major depressive disorder, recurrent, unspecified: Secondary | ICD-10-CM

## 2019-04-10 DIAGNOSIS — Z9189 Other specified personal risk factors, not elsewhere classified: Secondary | ICD-10-CM

## 2019-04-10 DIAGNOSIS — R7303 Prediabetes: Secondary | ICD-10-CM

## 2019-04-10 DIAGNOSIS — E559 Vitamin D deficiency, unspecified: Secondary | ICD-10-CM

## 2019-04-10 MED ORDER — VITAMIN D (ERGOCALCIFEROL) 1.25 MG (50000 UNIT) PO CAPS: 50000.0000 [IU] | ORAL_CAPSULE | ORAL | 0 refills | Status: DC

## 2019-04-10 MED ORDER — METFORMIN HCL 500 MG PO TABS: 500.0000 mg | ORAL_TABLET | Freq: Every day | ORAL | 0 refills | Status: DC

## 2019-04-11 NOTE — Progress Notes (Signed)
Office: [phone]  /  Fax: [phone]   HPI:   Chief Complaint: OBESITY [name] is here to discuss her progress with her obesity treatment plan. She is keeping a food journal with 1100-1300 calories and 75+ grams of protein and is following her eating plan approximately 60-70% of the time. She states she is walking 30-45 minutes 2 times per week. [name] did well with journaling, but struggled to meet her protein goal. She lost her journaling sheet and requests another one.  Her weight is 247 lb (112 kg) [name] and has had a weight loss of 3 pounds over a period of 2 weeks since her last visit. She has lost 3 lbs since starting treatment with [name].  Vitamin D deficiency [name] has a new diagnosis of Vitamin D deficiency, which is uncontrolled. She is currently not taking Vit D and denies nausea, vomiting or muscle weakness but admits to fatigue.  Pre-Diabetes [name] has a new diagnosis of prediabetes based on her elevated Hgb A1c and was informed this puts her at greater risk of developing diabetes. Last A1c elevated at 5.9 on 03/27/2019. She struggles to reduce simple carbs. She denies nausea or hypoglycemia. She does report polyphagia.  At risk for diabetes [name] is at higher than average risk for developing diabetes due to her obesity. She currently denies polyuria or polydipsia.  Major Depressive Disorder [name] has a diagnosis of major depressive disorder with frequent exacerbations and a history of multiple drug overdoses including amphetamines. Mood is stable now. She shows no sign of suicidal or homicidal ideations.  Depression screen [name] 2/9 03/27/2019 10/28/2015 08/26/2015  Decreased Interest 3 0 0  Down, Depressed, Hopeless 3 3 0  PHQ - 2 Score 6 3 0  Altered sleeping 3 - -  Tired, decreased energy 3 - -  Change in appetite 3 - -  Feeling bad or failure about yourself  3 - -  Trouble concentrating 3 - -  Moving slowly or fidgety/restless 3 - -  Suicidal thoughts 2 - -   PHQ-9 Score 26 - -  Difficult doing work/chores Extremely dIfficult - -  Some recent data might be hidden   ASSESSMENT AND PLAN:  Vitamin D deficiency - Plan: Vitamin D, Ergocalciferol, ([name]) 1.25 MG (50000 UT) CAPS capsule  Prediabetes - Plan: metFORMIN (GLUCOPHAGE) 500 MG tablet  At risk for diabetes mellitus  Recurrent major depressive disorder, remission status unspecified (HCC)  Class 3 severe obesity with serious comorbidity and body mass index (BMI) of 40.0 to 44.9 in adult, unspecified obesity type ([place])  PLAN:  Vitamin D Deficiency [name] was informed that low Vitamin D levels contributes to fatigue and are associated with obesity, breast, and colon cancer. She agrees to start prescription Vit D @ 50,000 IU every week #4 with 0 refills and will follow-up for routine testing of Vitamin D, at least 2-3 times per year. She was informed of the risk of over-replacement of Vitamin D and agrees to not increase her dose unless she discusses this with [name] first. [name] agrees to follow-up with our clinic in 2 weeks.  Pre-Diabetes [name] will continue to work on weight loss, exercise, and decreasing simple carbohydrates in her diet to help decrease the risk of diabetes. We dicussed metformin including benefits and risks. She was informed that eating too many simple carbohydrates or too many calories at one sitting increases the likelihood of GI side effects. [name] will start metformin 500 mg QAM #30 with 0 refills. She agrees to follow-up  with our clinic in 2 weeks.  Diabetes risk counseling [name] was given extended (30 minutes) diabetes prevention counseling [name]. She is 53 y.o. female and has risk factors for diabetes including obesity. We discussed intensive lifestyle modifications [name] with an emphasis on weight loss as well as increasing exercise and decreasing simple carbohydrates in her diet.  Major Depressive Disorder Will continue to monitor closely and avoid use of  amphetamine or amphetamine-like drugs.  Obesity [name] is currently in the action stage of change. As such, her goal is to continue with weight loss efforts She has agreed to keep a food journal with 1100-1300 calories and 75+ grams of protein.  [name] has been instructed to work up to a goal of 150 minutes of combined cardio and strengthening exercise per week for weight loss and overall health benefits. We discussed the following Behavioral Modification Strategies [name]: increasing lean protein intake, decreasing simple carbohydrates, work on meal planning and easy cooking plans.  [name] has agreed to follow-up with our clinic in 2 weeks. She was informed of the importance of frequent follow-up visits to maximize her success with intensive lifestyle modifications for her multiple health conditions.  ALLERGIES: Allergies  Allergen Reactions  . Bee Venom Anaphylaxis    Other reaction(s): PRURITUS, RASH  . Coconut Fatty Acids Anaphylaxis    Patient allergic to coconut in general  . Mushroom Ext Cmplx(Shiitake-Reishi-Mait) Anaphylaxis  . Nutritional Supplements Anaphylaxis and Itching    walnuts  . Other Anaphylaxis, Hives and Itching    Ragweed  . Shellfish Allergy Anaphylaxis  . Strawberry Extract Anaphylaxis  . Hydrocodone-Acetaminophen Itching    MEDICATIONS: Current Outpatient Medications on File Prior to Visit  Medication Sig Dispense Refill  . albuterol (PROVENTIL HFA;VENTOLIN HFA) 108 (90 BASE) MCG/ACT inhaler Inhale 2 puffs into the lungs every 6 (six) hours as needed for wheezing.    Marland Kitchen BIOTIN 5000 PO Take 5,000 Units by mouth daily.     . budesonide-formoterol (SYMBICORT) 80-4.5 MCG/ACT inhaler Inhale 2 puffs into the lungs 2 (two) times daily.    . clonazePAM (KLONOPIN) 0.5 MG tablet Take 1 tablet (0.5 mg total) by mouth 3 (three) times daily as needed. for anxiety 90 tablet 1  . diclofenac sodium (VOLTAREN) 1 % GEL Apply 2 g topically 4 (four) times daily. 100 g 1  .  divalproex (DEPAKOTE ER) 250 MG 24 hr tablet Take 3 tablets (750 mg total) by mouth daily. 90 tablet 2  . doxepin (SINEQUAN) 10 MG/ML solution Take 1 mL (10 mg total) by mouth at bedtime. 30 mL 2  . DULoxetine (CYMBALTA) 30 MG capsule Take 1 capsule (30 mg total) by mouth daily. 90 capsule 0  . EPINEPHrine (EPIPEN) 0.3 mg/0.3 mL DEVI Inject 0.3 mg into the muscle as needed. For allergic reaction to stinging insects, mushrooms, and shellfish     . fluticasone (FLONASE) 50 MCG/ACT nasal spray Place 1 spray into both nostrils as needed (allergies/runny nose).     Marland Kitchen gabapentin (NEURONTIN) 300 MG capsule Take 600 mg by mouth 2 (two) times daily.     . Oxycodone HCl 10 MG TABS Take 10 mg by mouth 3 (three) times daily as needed.      No current facility-administered medications on file prior to visit.     PAST MEDICAL HISTORY: Past Medical History:  Diagnosis Date  . Allergic rhinitis   . Allergic to cats    pet dander  . Anxiety   . Arthritis 09/2018   both feet  .  Asthma   . Back pain   . Bipolar disorder (HCC)   . Cervicalgia   . Chest pain   . Chronic fatigue syndrome   . Constipation   . Depression   . Dyspnea   . Environmental allergies   . Fibromyalgia   . GERD (gastroesophageal reflux disease)   . Grave's disease   . Headache    migraines  . High cholesterol   . History of blood clots   . Hypertension   . Hypothyroidism   . Joint pain   . Lactose intolerance   . Lower extremity edema   . Multiple food allergies   . OSA (obstructive sleep apnea)   . Palpitations   . Panic disorder   . Rheumatoid arthritis (HCC)   . Risk for falls   . Sciatica   . 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 Syncope and collapse   . Thyroid disease   . Vasovagal syncope   . Vertigo   . Vitamin D deficiency     PAST SURGICAL HISTORY: Past Surgical History:  Procedure Laterality Date  . ABDOMINAL HYSTERECTOMY  2006   partial  . GASTRIC BYPASS   2008    SOCIAL HISTORY: Social History   Tobacco Use  . Smoking status: Never Smoker  . Smokeless tobacco: Never Used  Substance Use Topics  . Alcohol use: Yes    Alcohol/week: 1.0 standard drinks    Types: 1 Glasses of wine per week    Comment: one glass of wine every 3 months  . Drug use: No    FAMILY HISTORY: Family History  Problem Relation Age of Onset  . Hypertension Mother   . Cirrhosis Mother   . Alcohol abuse Mother   . Depression Mother   . Physical abuse Mother   . Hyperlipidemia Mother   . Thyroid disease Mother   . Anxiety disorder Mother   . Hypertension Sister   . Alcohol abuse Sister   . Drug abuse Sister   . Depression Sister   . Anxiety disorder Sister   . OCD Sister   . Hypertension Father   . Alcohol abuse Father   . Hyperlipidemia Father   . Depression Father   . Drug abuse Father   . ADD / ADHD Other   . Depression Sister   . Diabetes Other   . Hypertension Other   . Diabetes Maternal Uncle   . Seizures Cousin   . Dementia Neg Hx    ROS: Review of Systems  Constitutional: Positive for malaise/fatigue.  Gastrointestinal: Negative for nausea and vomiting.  Musculoskeletal:       Negative for muscle weakness.  Endo/Heme/Allergies:       Negative for hypoglycemia. Positive for polyphagia.  Psychiatric/Behavioral: Positive for depression (major depressive disorder). Negative for suicidal ideas.       Negative for homicidal ideas.   PHYSICAL EXAM: Blood pressure 121/79, pulse 77, temperature 98.1 F (36.7 C), temperature source Oral, height 5\' 5"  (1.651 m), weight 247 lb (112 kg), SpO2 99 %. Body mass index is 41.1 kg/m. Physical Exam Vitals signs reviewed.  Constitutional:      Appearance: Normal appearance. She is obese.  Cardiovascular:     Rate and Rhythm: Normal rate.     Pulses: Normal pulses.  Pulmonary:     Effort: Pulmonary effort is normal.     Breath sounds: Normal breath sounds.  Musculoskeletal: Normal range of  motion.  Skin:  General: Skin is warm and dry.  Neurological:     Mental Status: She is alert and oriented to person, place, and time.  Psychiatric:        Behavior: Behavior normal.   RECENT LABS AND TESTS: BMET    Component Value Date/Time   NA 143 03/27/2019 1028   K 3.9 03/27/2019 1028   CL 105 03/27/2019 1028   CO2 24 03/27/2019 1028   GLUCOSE 83 03/27/2019 1028   GLUCOSE 104 (H) 09/16/2018 1433   BUN 14 03/27/2019 1028   CREATININE 0.80 03/27/2019 1028   CREATININE 0.85 06/12/2014 2031   CALCIUM 9.2 03/27/2019 1028   GFRNONAA 83 03/27/2019 1028   GFRAA 96 03/27/2019 1028   Lab Results  Component Value Date   HGBA1C 5.9 (H) 03/27/2019   Lab Results  Component Value Date   INSULIN 7.9 03/27/2019   CBC    Component Value Date/Time   WBC 6.2 03/27/2019 1028   WBC 7.0 09/16/2018 1433   RBC 4.25 03/27/2019 1028   RBC 4.63 09/16/2018 1433   HGB 11.6 03/27/2019 1028   HCT 35.4 03/27/2019 1028   PLT 268 03/27/2019 1028   MCV 83 03/27/2019 1028   MCH 27.3 03/27/2019 1028   MCH 27.6 09/16/2018 1433   MCHC 32.8 03/27/2019 1028   MCHC 30.2 09/16/2018 1433   RDW 14.1 03/27/2019 1028   LYMPHSABS 1.4 03/27/2019 1028   MONOABS 1.0 09/16/2018 1433   EOSABS 0.2 03/27/2019 1028   BASOSABS 0.0 03/27/2019 1028   Iron/TIBC/Ferritin/ %Sat No results found for: IRON, TIBC, FERRITIN, IRONPCTSAT Lipid Panel     Component Value Date/Time   CHOL 187 03/27/2019 1028   TRIG 102 03/27/2019 1028   HDL 101 03/27/2019 1028   LDLCALC 68 03/27/2019 1028   Hepatic Function Panel     Component Value Date/Time   PROT 7.0 03/27/2019 1028   ALBUMIN 4.2 03/27/2019 1028   AST 18 03/27/2019 1028   ALT 19 03/27/2019 1028   ALKPHOS 81 03/27/2019 1028   BILITOT <0.2 03/27/2019 1028   BILIDIR <0.1 05/05/2011 2129   IBILI NOT CALCULATED 05/05/2011 2129      Component Value Date/Time   TSH 4.780 (H) 03/27/2019 1028   TSH 0.930 11/07/2018 1518   TSH 1.398 05/05/2011 2129    Results for Colleen BartersSMITH, Kateryn A (MRN [id number]) as of 04/11/2019 10:11  Ref. Range 03/27/2019 10:28  Vitamin D, 25-Hydroxy Latest Ref Range: 30.0 - 100.0 ng/mL 17.7 (L)   OBESITY BEHAVIORAL INTERVENTION VISIT  [name]'s visit was #2   Starting weight: 250 lbs Starting date: 03/27/2019 [name]'s weight: 247 lbs  [name]'s date: 04/10/2019 Total lbs lost to date: 3     04/10/2019  Height 5\' 5"  (1.651 m)  Weight 247 lb (112 kg)  BMI (Calculated) 41.1  BLOOD PRESSURE - SYSTOLIC 121  BLOOD PRESSURE - DIASTOLIC 79   Body Fat % 51.1 %  Total Body Water (lbs) 86 lbs   ASK: We discussed the diagnosis of obesity with [name] [name] and [name] agreed to give [name].  ASSESS: [name] has the diagnosis of obesity and her BMI [name] is 41.2. [name] is in the action stage of change.   ADVISE: [name] was educated on the multiple health risks of obesity as well as the benefit of weight loss to improve her health. She was advised of the need for long term treatment and the importance of lifestyle modifications to improve her  current health and to decrease her risk of future health problems.  AGREE: Multiple dietary modification options and treatment options were discussed and  [name] agreed to follow the recommendations documented in the above note.  ARRANGE: [name] was educated on the importance of frequent visits to treat obesity as outlined per CMS and USPSTF guidelines and agreed to schedule her next follow up appointment [name].  I, Marianna Paymentenise Haag, am acting as Energy managertranscriptionist for Quillian Quincearen , MD  I have reviewed the above documentation for accuracy and completeness, and I agree with the above. -Quillian Quincearen , MD

## 2019-04-23 ENCOUNTER — Ambulatory Visit (INDEPENDENT_AMBULATORY_CARE_PROVIDER_SITE_OTHER): Payer: Medicare HMO | Admitting: Family Medicine

## 2019-04-23 ENCOUNTER — Other Ambulatory Visit: Payer: Self-pay

## 2019-04-23 ENCOUNTER — Encounter (INDEPENDENT_AMBULATORY_CARE_PROVIDER_SITE_OTHER): Payer: Self-pay | Admitting: Family Medicine

## 2019-04-23 VITALS — BP 113/75 | HR 89 | Temp 98.0°F

## 2019-04-23 DIAGNOSIS — R7303 Prediabetes: Secondary | ICD-10-CM | POA: Diagnosis not present

## 2019-04-23 DIAGNOSIS — F339 Major depressive disorder, recurrent, unspecified: Secondary | ICD-10-CM

## 2019-04-23 DIAGNOSIS — G4730 Sleep apnea, unspecified: Secondary | ICD-10-CM

## 2019-04-23 DIAGNOSIS — E559 Vitamin D deficiency, unspecified: Secondary | ICD-10-CM | POA: Diagnosis not present

## 2019-04-23 DIAGNOSIS — Z6841 Body Mass Index (BMI) 40.0 and over, adult: Secondary | ICD-10-CM

## 2019-04-23 MED ORDER — METFORMIN HCL 500 MG PO TABS: 500.0000 mg | ORAL_TABLET | Freq: Every day | ORAL | 0 refills | Status: DC

## 2019-04-23 MED ORDER — VITAMIN D (ERGOCALCIFEROL) 1.25 MG (50000 UNIT) PO CAPS: 50000.0000 [IU] | ORAL_CAPSULE | ORAL | 0 refills | Status: DC

## 2019-04-23 NOTE — Progress Notes (Signed)
Office: [phone]  /  Fax: [phone]   HPI:   Chief Complaint: OBESITY [name] is here to discuss her progress with her obesity treatment plan. She is on the keep a food journal with 1100-1300 calories and 75+ grams of protein daily and is following her eating plan approximately 80 % of the time. She states she is painting a room and walking 3,800 steps 2 times per week. [name] wakes up at 8 am, breakfast is at 9 am, and dinner between 4-5 pm. She states she is hungry at 8 pm and goes to bed at 1 am. She has mild sleep apnea with no CPAP. She craves sweets at night.  Her weight is (P) 245 lb (111.1 kg) today and has had a weight loss of 2 pounds over a period of 2 weeks since her last visit. She has lost 5 lbs since starting treatment with [name].  Vitamin D Deficiency [name] has a diagnosis of vitamin D deficiency. She is currently taking prescription Vit D and denies nausea, vomiting or muscle weakness.  Pre-Diabetes [name] has a diagnosis of pre-diabetes based on her elevated Hgb A1c and was informed this puts her at greater risk of developing diabetes. Last A1c was 5.9 on 03/27/2019. She is taking metformin currently and continues to work on diet and exercise to decrease risk of diabetes.   Major Depressive Disorder [name] has a diagnosis of major depressive disorder. Her symptoms are stable.  Mild Sleep Apnea [name] has a diagnosis of mild sleep apnea. She is not using CPAP.  ASSESSMENT AND PLAN:  Vitamin D deficiency - Plan: Vitamin D, Ergocalciferol, (DRISDOL) 1.25 MG (50000 UT) CAPS capsule  Prediabetes - Plan: metFORMIN (GLUCOPHAGE) 500 MG tablet  Episode of recurrent major depressive disorder, unspecified depression episode severity (HCC)  Mild sleep apnea  Class 3 severe obesity with serious comorbidity and body mass index (BMI) of 40.0 to 44.9 in adult, unspecified obesity type (HCC)  PLAN:  Vitamin D Deficiency [name] was informed that low vitamin D levels  contributes to fatigue and are associated with obesity, breast, and colon cancer. [name] agrees to continue taking prescription Vit D 50,000 IU every week #4 and we will refill for 1 month. She will follow up for routine testing of vitamin D, at least 2-3 times per year. She was informed of the risk of over-replacement of vitamin D and agrees to not increase her dose unless she discusses this with [name] first. [name] agrees to follow up with our clinic in 2 weeks.  Pre-Diabetes [name] will continue to work on weight loss, exercise, and decreasing simple carbohydrates in her diet to help decrease the risk of diabetes. We dicussed metformin including benefits and risks. She was informed that eating too many simple carbohydrates or too many calories at one sitting increases the likelihood of GI side effects. [name] agrees to continue taking metformin 500 mg PO q AM #30 and we will refill for 1 month. [name] agrees to follow up with our clinic in 2 weeks as directed to monitor her progress.  Major Depressive Disorder We will continue to monitor.  Mild Sleep Apnea We will continue to monitor with weight loss.  Obesity [name] is currently in the action stage of change. As such, her goal is to continue with weight loss efforts She has agreed to keep a food journal with 1100-1300 calories and 75+ grams of protein daily [name] has been instructed to work up to a goal of 150 minutes of combined cardio and  strengthening exercise per week or as tolerated for weight loss and overall health benefits. We discussed the following Behavioral Modification Strategies today: increasing lean protein intake, decreasing simple carbohydrates, increasing vegetables, increase H20 intake, no skipping meals, work on meal planning and easy cooking plans, and better snacking choices [name] is to increase meals throughout the day and focus on protein.  [name] has agreed to follow up with our clinic in 2 weeks. She was  informed of the importance of frequent follow up visits to maximize her success with intensive lifestyle modifications for her multiple health conditions.  ALLERGIES: Allergies  Allergen Reactions  . Bee Venom Anaphylaxis    Other reaction(s): PRURITUS, RASH  . Coconut Fatty Acids Anaphylaxis    Patient allergic to coconut in general  . Mushroom Ext Cmplx(Shiitake-Reishi-Mait) Anaphylaxis  . Nutritional Supplements Anaphylaxis and Itching    walnuts  . Other Anaphylaxis, Hives and Itching    Ragweed  . Shellfish Allergy Anaphylaxis  . Strawberry Extract Anaphylaxis  . Hydrocodone-Acetaminophen Itching    MEDICATIONS: Current Outpatient Medications on File Prior to Visit  Medication Sig Dispense Refill  . albuterol (PROVENTIL HFA;VENTOLIN HFA) 108 (90 BASE) MCG/ACT inhaler Inhale 2 puffs into the lungs every 6 (six) hours as needed for wheezing.    Marland Kitchen BIOTIN 5000 PO Take 5,000 Units by mouth daily.     . budesonide-formoterol (SYMBICORT) 80-4.5 MCG/ACT inhaler Inhale 2 puffs into the lungs 2 (two) times daily.    . clonazePAM (KLONOPIN) 0.5 MG tablet Take 1 tablet (0.5 mg total) by mouth 3 (three) times daily as needed. for anxiety 90 tablet 1  . diclofenac sodium (VOLTAREN) 1 % GEL Apply 2 g topically 4 (four) times daily. 100 g 1  . divalproex (DEPAKOTE ER) 250 MG 24 hr tablet Take 3 tablets (750 mg total) by mouth daily. 90 tablet 2  . doxepin (SINEQUAN) 10 MG/ML solution Take 1 mL (10 mg total) by mouth at bedtime. 30 mL 2  . DULoxetine (CYMBALTA) 30 MG capsule Take 1 capsule (30 mg total) by mouth daily. 90 capsule 0  . EPINEPHrine (EPIPEN) 0.3 mg/0.3 mL DEVI Inject 0.3 mg into the muscle as needed. For allergic reaction to stinging insects, mushrooms, and shellfish     . fluticasone (FLONASE) 50 MCG/ACT nasal spray Place 1 spray into both nostrils as needed (allergies/runny nose).     Marland Kitchen gabapentin (NEURONTIN) 300 MG capsule Take 600 mg by mouth 2 (two) times daily.     .  Oxycodone HCl 10 MG TABS Take 10 mg by mouth 3 (three) times daily as needed.      No current facility-administered medications on file prior to visit.     PAST MEDICAL HISTORY: Past Medical History:  Diagnosis Date  . Allergic rhinitis   . Allergic to cats    pet dander  . Anxiety   . Arthritis 09/2018   both feet  . Asthma   . Back pain   . Bipolar disorder ([place])   . Cervicalgia   . Chest pain   . Chronic fatigue syndrome   . Constipation   . Depression   . Dyspnea   . Environmental allergies   . Fibromyalgia   . GERD (gastroesophageal reflux disease)   . Grave's disease   . Headache    migraines  . High cholesterol   . History of blood clots   . Hypertension   . Hypothyroidism   . Joint pain   . Lactose intolerance   .  Lower extremity edema   . Multiple food allergies   . OSA (obstructive sleep apnea)   . Palpitations   . Panic disorder   . Rheumatoid arthritis (HCC)   . Risk for falls   . Sciatica   . 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 Syncope and collapse   . Thyroid disease   . Vasovagal syncope   . Vertigo   . Vitamin D deficiency     PAST SURGICAL HISTORY: Past Surgical History:  Procedure Laterality Date  . ABDOMINAL HYSTERECTOMY  2006   partial  . GASTRIC BYPASS  2008    SOCIAL HISTORY: Social History   Tobacco Use  . Smoking status: Never Smoker  . Smokeless tobacco: Never Used  Substance Use Topics  . Alcohol use: Yes    Alcohol/week: 1.0 standard drinks    Types: 1 Glasses of wine per week    Comment: one glass of wine every 3 months  . Drug use: No    FAMILY HISTORY: Family History  Problem Relation Age of Onset  . Hypertension Mother   . Cirrhosis Mother   . Alcohol abuse Mother   . Depression Mother   . Physical abuse Mother   . Hyperlipidemia Mother   . Thyroid disease Mother   . Anxiety disorder Mother   . Hypertension Sister   . Alcohol abuse Sister   . Drug abuse Sister    . Depression Sister   . Anxiety disorder Sister   . OCD Sister   . Hypertension Father   . Alcohol abuse Father   . Hyperlipidemia Father   . Depression Father   . Drug abuse Father   . ADD / ADHD Other   . Depression Sister   . Diabetes Other   . Hypertension Other   . Diabetes Maternal Uncle   . Seizures Cousin   . Dementia Neg Hx     ROS: Review of Systems  Constitutional: Positive for weight loss.  Gastrointestinal: Negative for nausea and vomiting.  Musculoskeletal:       Negative muscle weakness  Psychiatric/Behavioral: Positive for depression. Negative for suicidal ideas.    PHYSICAL EXAM: Blood pressure 113/75, pulse 89, temperature 98 F (36.7 C), temperature source Oral, height (P) 5\' 5"  (1.651 m), weight (P) 245 lb (111.1 kg), SpO2 (!) 89 %. Body mass index is 40.77 kg/m (pended). Physical Exam Vitals signs reviewed.  Constitutional:      Appearance: Normal appearance. She is obese.  Cardiovascular:     Rate and Rhythm: Normal rate.     Pulses: Normal pulses.  Pulmonary:     Effort: Pulmonary effort is normal.     Breath sounds: Normal breath sounds.  Musculoskeletal: Normal range of motion.  Skin:    General: Skin is warm and dry.  Neurological:     Mental Status: She is alert and oriented to person, place, and time.  Psychiatric:        Mood and Affect: Mood normal.        Behavior: Behavior normal.     RECENT LABS AND TESTS: BMET    Component Value Date/Time   NA 143 03/27/2019 1028   K 3.9 03/27/2019 1028   CL 105 03/27/2019 1028   CO2 24 03/27/2019 1028   GLUCOSE 83 03/27/2019 1028   GLUCOSE 104 (H) 09/16/2018 1433   BUN 14 03/27/2019 1028   CREATININE 0.80 03/27/2019 1028   CREATININE 0.85 06/12/2014 2031   CALCIUM 9.2  03/27/2019 1028   GFRNONAA 83 03/27/2019 1028   GFRAA 96 03/27/2019 1028   Lab Results  Component Value Date   HGBA1C 5.9 (H) 03/27/2019   Lab Results  Component Value Date   INSULIN 7.9 03/27/2019   CBC     Component Value Date/Time   WBC 6.2 03/27/2019 1028   WBC 7.0 09/16/2018 1433   RBC 4.25 03/27/2019 1028   RBC 4.63 09/16/2018 1433   HGB 11.6 03/27/2019 1028   HCT 35.4 03/27/2019 1028   PLT 268 03/27/2019 1028   MCV 83 03/27/2019 1028   MCH 27.3 03/27/2019 1028   MCH 27.6 09/16/2018 1433   MCHC 32.8 03/27/2019 1028   MCHC 30.2 09/16/2018 1433   RDW 14.1 03/27/2019 1028   LYMPHSABS 1.4 03/27/2019 1028   MONOABS 1.0 09/16/2018 1433   EOSABS 0.2 03/27/2019 1028   BASOSABS 0.0 03/27/2019 1028   Iron/TIBC/Ferritin/ %Sat No results found for: IRON, TIBC, FERRITIN, IRONPCTSAT Lipid Panel     Component Value Date/Time   CHOL 187 03/27/2019 1028   TRIG 102 03/27/2019 1028   HDL 101 03/27/2019 1028   LDLCALC 68 03/27/2019 1028   Hepatic Function Panel     Component Value Date/Time   PROT 7.0 03/27/2019 1028   ALBUMIN 4.2 03/27/2019 1028   AST 18 03/27/2019 1028   ALT 19 03/27/2019 1028   ALKPHOS 81 03/27/2019 1028   BILITOT <0.2 03/27/2019 1028   BILIDIR <0.1 05/05/2011 2129   IBILI NOT CALCULATED 05/05/2011 2129      Component Value Date/Time   TSH 4.780 (H) 03/27/2019 1028   TSH 0.930 11/07/2018 1518   TSH 1.398 05/05/2011 2129      OBESITY BEHAVIORAL INTERVENTION VISIT  Today's visit was # 3   Starting weight: 250 lbs Starting date: 03/27/2019 Today's weight : 245 lbs Today's date: 04/23/2019 Total lbs lost to date: 5 At least 15 minutes were spent on discussing the following behavioral intervention visit.   ASK: We discussed the diagnosis of obesity with Jess Bartersheresa A Scallan today and Aggie Cosierheresa agreed to give us permission to discuss obesity behavioral modification therapy today.  ASSESS: Aggie Cosierheresa has the diagnosis of obesity and her BMI today is 40.77 Aggie Cosierheresa is in the action stage of change   ADVISE: Aggie Cosierheresa was educated on the multiple health risks of obesity as well as the benefit of weight loss to improve her health. She was advised of the need for long  term treatment and the importance of lifestyle modifications to improve her current health and to decrease her risk of future health problems.  AGREE: Multiple dietary modification options and treatment options were discussed and  Aggie Cosierheresa agreed to follow the recommendations documented in the above note.  ARRANGE: Aggie Cosierheresa was educated on the importance of frequent visits to treat obesity as outlined per CMS and USPSTF guidelines and agreed to schedule her next follow up appointment today.  Trude McburneyI, Sharon Martin, am acting as transcriptionist for Helane RimaErica Gwenlyn Hottinger, DO  I have reviewed the above documentation for accuracy and completeness, and I agree with the above. Helane Rima- Jerret Mcbane, DO

## 2019-04-25 ENCOUNTER — Other Ambulatory Visit: Payer: Self-pay

## 2019-04-25 ENCOUNTER — Ambulatory Visit (INDEPENDENT_AMBULATORY_CARE_PROVIDER_SITE_OTHER): Payer: Medicare HMO | Admitting: Psychiatry

## 2019-04-25 DIAGNOSIS — F411 Generalized anxiety disorder: Secondary | ICD-10-CM | POA: Diagnosis not present

## 2019-04-25 DIAGNOSIS — F333 Major depressive disorder, recurrent, severe with psychotic symptoms: Secondary | ICD-10-CM | POA: Diagnosis not present

## 2019-04-26 ENCOUNTER — Encounter (HOSPITAL_COMMUNITY): Payer: Self-pay | Admitting: Psychiatry

## 2019-04-26 NOTE — Progress Notes (Signed)
Virtual Visit via Video Note  I connected with [name] on 04/26/19 at  4:00 PM EST by a video enabled telemedicine application and verified that I am speaking with the correct person using two identifiers.  Location: Patient: [name] Provider: Lise Auer, LCSW   I discussed the limitations of evaluation and management by telemedicine and the availability of in person appointments. The patient expressed understanding and agreed to proceed.  History of Present Illness: MDD and GAD   Observations/Objective: Counselor met with [name] and her significant other, [name] for family therapy via Webex. Counselor assessed MH symptoms and progress on treatment plan goals. [name] presents with moderate depression and high anxiety. [name] denied suicidal ideation or self-harm behaviors. [name] shared that she is experiencing significant life stressors related to her relationship with her significant other. She requested that he be part of the session today to communicate about needs and concerns. Counselor first assessed daily functioning and received background information regarding her concerns. [name] and [name] presented their perspectives and concerns. Counselor used motivational interviewing strategies to elicit needed information for better understanding. Counselor highlighted and processed value conflicts and communication differences. Counselor encouraged homework for both to complete with outlining expectations, defining terms, and communication activities to better express their needs. Counselor to follow up with homework completion in 3 weeks.   Assessment and Plan: Counselor will continue to meet with patient to address treatment plan goals. Patient will continue to follow recommendations of providers and implement skills learned in session.  Follow Up Instructions: Counselor will send information for next session via Webex.     I discussed the assessment and treatment plan  with the patient. The patient was provided an opportunity to ask questions and all were answered. The patient agreed with the plan and demonstrated an understanding of the instructions.   The patient was advised to call back or seek an in-person evaluation if the symptoms worsen or if the condition fails to improve as anticipated.  I provided 50 minutes of non-face-to-face time during this encounter.   Lise Auer, LCSW

## 2019-05-07 ENCOUNTER — Other Ambulatory Visit: Payer: Self-pay

## 2019-05-07 ENCOUNTER — Encounter (INDEPENDENT_AMBULATORY_CARE_PROVIDER_SITE_OTHER): Payer: Self-pay | Admitting: Family Medicine

## 2019-05-07 ENCOUNTER — Ambulatory Visit (INDEPENDENT_AMBULATORY_CARE_PROVIDER_SITE_OTHER): Payer: Medicare HMO | Admitting: Family Medicine

## 2019-05-07 VITALS — BP 106/71 | HR 79 | Temp 97.6°F | Ht 65.0 in | Wt 242.0 lb

## 2019-05-07 DIAGNOSIS — F339 Major depressive disorder, recurrent, unspecified: Secondary | ICD-10-CM

## 2019-05-07 DIAGNOSIS — E559 Vitamin D deficiency, unspecified: Secondary | ICD-10-CM

## 2019-05-07 DIAGNOSIS — Z9884 Bariatric surgery status: Secondary | ICD-10-CM

## 2019-05-07 DIAGNOSIS — E038 Other specified hypothyroidism: Secondary | ICD-10-CM

## 2019-05-07 DIAGNOSIS — G4733 Obstructive sleep apnea (adult) (pediatric): Secondary | ICD-10-CM

## 2019-05-07 DIAGNOSIS — R7303 Prediabetes: Secondary | ICD-10-CM | POA: Diagnosis not present

## 2019-05-07 DIAGNOSIS — E039 Hypothyroidism, unspecified: Secondary | ICD-10-CM | POA: Diagnosis not present

## 2019-05-07 DIAGNOSIS — Z6841 Body Mass Index (BMI) 40.0 and over, adult: Secondary | ICD-10-CM | POA: Diagnosis not present

## 2019-05-07 MED ORDER — VITAMIN D (ERGOCALCIFEROL) 1.25 MG (50000 UNIT) PO CAPS: 50000.0000 [IU] | ORAL_CAPSULE | ORAL | 0 refills | Status: DC

## 2019-05-09 NOTE — Progress Notes (Signed)
Office: [phone]  /  Fax: [phone]   HPI:  Chief Complaint: OBESITY [name] is here to discuss her progress with her obesity treatment plan. She is keeping a food journal with 1100-1300 calories and 75g of protein  and states she is following her eating plan approximately 50 % of the time. She states she is exercising 0 minutes 0 times per week.  [name] has been struggling over the last few weeks with depression. She "did not eat" for the 3 days except minimal food with medications, until her son made her eat. She is not having traditional Christmas this year due to COVID, so she is thinking about going to ex-fiance's family gathering with karaoke.  Today's visit was # 4  Starting weight: 250 lbs Starting date: 03/27/19 Today's weight : Weight: 242 lb (109.8 kg)  Today's date: 05/07/19 Total lbs lost to date: 8 lbs Total lbs lost since last in-office visit: 3 lbs  Vitamin D Deficiency [name] has a diagnosis of Vit D deficiency. She is currently taking vit D 50,000 IU weekly. Her vit D level on 03/27/19 was 17.7.   Prediabetes [name] has prediabetes. She is currently taking Metformin, which she is tolerating well. Says that it is helping her remember to eat since she eats when she takes her medication. Her HgA1c on 03/27/19 was 5.9.   OSA  [name] has mild OSA. She is not using a CPAP currently.   Major Depression Worsening. She is followed by psychiatry and psychology. No evidence of SI/HI.   Subclinical Hypothyroid [name] has subclinical hypothyroidism. She is not currently on medications.   ASSESSMENT AND PLAN:  Vitamin D deficiency - Plan: Vitamin D, Ergocalciferol, (DRISDOL) 1.25 MG (50000 UT) CAPS capsule  Prediabetes  Recurrent major depressive disorder, remission status unspecified (HCC)  Subclinical hypothyroidism  S/P gastric bypass  OSA (obstructive sleep apnea)  Class 3 severe obesity with serious comorbidity and body mass index (BMI) of 40.0 to  44.9 in adult, unspecified obesity type (HCC)  PLAN: Vitamin D Deficiency Low vitamin D level contributes to fatigue and are associated with obesity, breast, and colon cancer. She agrees to continue to take prescription Vit D @50 ,000 IU every week #4 with no refills sent in today and will follow up for routine testing of vitamin D, at least 2-3 times per year to avoid over-replacement.  Pre-Diabetes [name] will continue to work on weight loss, exercise, and decreasing simple carbohydrates to help decrease the risk of diabetes.   Major Depression Behavior modification techniques were discussed today to help [name] deal with her emotional/non-hunger eating behaviors. She will continue current treatment. She has an appointment next week. Will continue to follow and monitor her progress.  Subclinical Hypothyroid [name] was informed of the importance of good thyroid control for overall health. Will continue to monitor.  Obesity [name] is currently in the action stage of change. As such, her goal is to continue with weight loss efforts. She has agreed to keep a food journal with 1100-1300 calories and 75g of protein daily.   [name] has been instructed to exercise as tolerated. She is getting 5,000 steps in.   We discussed the following Behavioral Modification Strategies today: emotional eating strategies, increasing water intake, and no skipping meals.   [name] has agreed to follow up with our clinic in 2 weeks. She was informed of the importance of frequent follow up visits to maximize her success with intensive lifestyle modifications for her multiple health conditions.  ALLERGIES: Allergies  Allergen  Reactions  . Bee Venom Anaphylaxis    Other reaction(s): PRURITUS, RASH  . Coconut Fatty Acids Anaphylaxis    Patient allergic to coconut in general  . Mushroom Ext Cmplx(Shiitake-Reishi-Mait) Anaphylaxis  . Nutritional Supplements Anaphylaxis and Itching    walnuts  . Other  Anaphylaxis, Hives and Itching    Ragweed  . Shellfish Allergy Anaphylaxis  . Strawberry Extract Anaphylaxis  . Hydrocodone-Acetaminophen Itching   MEDICATIONS: Current Outpatient Medications on File Prior to Visit  Medication Sig Dispense Refill  . albuterol (PROVENTIL HFA;VENTOLIN HFA) 108 (90 BASE) MCG/ACT inhaler Inhale 2 puffs into the lungs every 6 (six) hours as needed for wheezing.    Marland Kitchen BIOTIN 5000 PO Take 5,000 Units by mouth daily.     . budesonide-formoterol (SYMBICORT) 80-4.5 MCG/ACT inhaler Inhale 2 puffs into the lungs 2 (two) times daily.    . clonazePAM (KLONOPIN) 0.5 MG tablet Take 1 tablet (0.5 mg total) by mouth 3 (three) times daily as needed. for anxiety 90 tablet 1  . diclofenac sodium (VOLTAREN) 1 % GEL Apply 2 g topically 4 (four) times daily. 100 g 1  . divalproex (DEPAKOTE ER) 250 MG 24 hr tablet Take 3 tablets (750 mg total) by mouth daily. 90 tablet 2  . doxepin (SINEQUAN) 10 MG/ML solution Take 1 mL (10 mg total) by mouth at bedtime. 30 mL 2  . DULoxetine (CYMBALTA) 30 MG capsule Take 1 capsule (30 mg total) by mouth daily. 90 capsule 0  . EPINEPHrine (EPIPEN) 0.3 mg/0.3 mL DEVI Inject 0.3 mg into the muscle as needed. For allergic reaction to stinging insects, mushrooms, and shellfish     . fluticasone (FLONASE) 50 MCG/ACT nasal spray Place 1 spray into both nostrils as needed (allergies/runny nose).     . metFORMIN (GLUCOPHAGE) 500 MG tablet Take 1 tablet (500 mg total) by mouth daily with breakfast. 30 tablet 0  . Oxycodone HCl 10 MG TABS Take 10 mg by mouth 3 (three) times daily as needed.     . gabapentin (NEURONTIN) 600 MG tablet Take 1 tablet by mouth 4 (four) times daily.    Marland Kitchen MOVANTIK 25 MG TABS tablet     . prazosin (MINIPRESS) 1 MG capsule      No current facility-administered medications on file prior to visit.   PAST MEDICAL HISTORY: Past Medical History:  Diagnosis Date  . Allergic rhinitis   . Allergic to cats    pet dander  . Anxiety   .  Arthritis 09/2018   both feet  . Asthma   . Back pain   . Bipolar disorder ([place])   . Cervicalgia   . Chest pain   . Chronic fatigue syndrome   . Constipation   . Depression   . Dyspnea   . Environmental allergies   . Fibromyalgia   . GERD (gastroesophageal reflux disease)   . Grave's disease   . Headache    migraines  . High cholesterol   . History of blood clots   . Hypertension   . Hypothyroidism   . Joint pain   . Lactose intolerance   . Lower extremity edema   . Multiple food allergies   . OSA (obstructive sleep apnea)   . Palpitations   . Panic disorder   . Rheumatoid arthritis ([place])   . Risk for falls   . Sciatica   . Severe 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 ([place])   . Syncope and collapse   . Thyroid disease   .  Vasovagal syncope   . Vertigo   . Vitamin D deficiency    PAST SURGICAL HISTORY: Past Surgical History:  Procedure Laterality Date  . ABDOMINAL HYSTERECTOMY  2006   partial  . GASTRIC BYPASS  2008   SOCIAL HISTORY: Social History   Tobacco Use  . Smoking status: Never Smoker  . Smokeless tobacco: Never Used  Substance Use Topics  . Alcohol use: Yes    Alcohol/week: 1.0 standard drinks    Types: 1 Glasses of wine per week    Comment: one glass of wine every 3 months  . Drug use: No   FAMILY HISTORY: Family History  Problem Relation Age of Onset  . Hypertension Mother   . Cirrhosis Mother   . Alcohol abuse Mother   . Depression Mother   . Physical abuse Mother   . Hyperlipidemia Mother   . Thyroid disease Mother   . Anxiety disorder Mother   . Hypertension Sister   . Alcohol abuse Sister   . Drug abuse Sister   . Depression Sister   . Anxiety disorder Sister   . OCD Sister   . Hypertension Father   . Alcohol abuse Father   . Hyperlipidemia Father   . Depression Father   . Drug abuse Father   . ADD / ADHD Other   . Depression Sister   . Diabetes Other   . Hypertension Other   . Diabetes Maternal  Uncle   . Seizures Cousin   . Dementia Neg Hx    ROS: Review of Systems  Constitutional: Positive for weight loss.  Psychiatric/Behavioral: Positive for depression.   PHYSICAL EXAM: Blood pressure 106/71, pulse 79, temperature 97.6 F (36.4 C), temperature source Oral, height 5\' 5"  (1.651 m), weight 242 lb (109.8 kg), SpO2 99 %. Body mass index is 40.27 kg/m.   Physical Exam Vitals reviewed.  Constitutional:      Appearance: Normal appearance.  HENT:     Head: Normocephalic.  Cardiovascular:     Rate and Rhythm: Normal rate.  Pulmonary:     Effort: Pulmonary effort is normal.  Musculoskeletal:        General: Normal range of motion.  Skin:    General: Skin is warm and dry.  Neurological:     Mental Status: She is alert and oriented to person, place, and time.  Psychiatric:        Mood and Affect: Mood normal.        Behavior: Behavior normal.    RECENT LABS AND TESTS: BMET    Component Value Date/Time   NA 143 03/27/2019 1028   K 3.9 03/27/2019 1028   CL 105 03/27/2019 1028   CO2 24 03/27/2019 1028   GLUCOSE 83 03/27/2019 1028   GLUCOSE 104 (H) 09/16/2018 1433   BUN 14 03/27/2019 1028   CREATININE 0.80 03/27/2019 1028   CREATININE 0.85 06/12/2014 2031   CALCIUM 9.2 03/27/2019 1028   GFRNONAA 83 03/27/2019 1028   GFRAA 96 03/27/2019 1028   Lab Results  Component Value Date   HGBA1C 5.9 (H) 03/27/2019   Lab Results  Component Value Date   INSULIN 7.9 03/27/2019   CBC    Component Value Date/Time   WBC 6.2 03/27/2019 1028   WBC 7.0 09/16/2018 1433   RBC 4.25 03/27/2019 1028   RBC 4.63 09/16/2018 1433   HGB 11.6 03/27/2019 1028   HCT 35.4 03/27/2019 1028   PLT 268 03/27/2019 1028   MCV 83 03/27/2019 1028   MCH  27.3 03/27/2019 1028   MCH 27.6 09/16/2018 1433   MCHC 32.8 03/27/2019 1028   MCHC 30.2 09/16/2018 1433   RDW 14.1 03/27/2019 1028   LYMPHSABS 1.4 03/27/2019 1028   MONOABS 1.0 09/16/2018 1433   EOSABS 0.2 03/27/2019 1028   BASOSABS  0.0 03/27/2019 1028   Lipid Panel     Component Value Date/Time   CHOL 187 03/27/2019 1028   TRIG 102 03/27/2019 1028   HDL 101 03/27/2019 1028   LDLCALC 68 03/27/2019 1028   Hepatic Function Panel     Component Value Date/Time   PROT 7.0 03/27/2019 1028   ALBUMIN 4.2 03/27/2019 1028   AST 18 03/27/2019 1028   ALT 19 03/27/2019 1028   ALKPHOS 81 03/27/2019 1028   BILITOT <0.2 03/27/2019 1028   BILIDIR <0.1 05/05/2011 2129   IBILI NOT CALCULATED 05/05/2011 2129      Component Value Date/Time   TSH 4.780 (H) 03/27/2019 1028   TSH 0.930 11/07/2018 1518   TSH 1.398 05/05/2011 2129    OBESITY BEHAVIORAL INTERVENTION VISIT DOCUMENTATION FOR INSURANCE (~15 minutes)  ASK: We discussed the diagnosis of obesity with [name] today and Zaniah agreed to give [name] permission to discuss obesity behavioral modification therapy today.  ASSESS: Kaysa has the diagnosis of obesity and her BMI today is 40.27 Israa is in the action stage of change   ADVISE: Karyl was educated on the multiple health risks of obesity as well as the benefit of weight loss to improve her health. She was advised of the need for long term treatment and the importance of lifestyle modifications to improve her current health and to decrease her risk of future health problems.  AGREE: Multiple dietary modification options and treatment options were discussed and  Jameria agreed to follow the recommendations documented in the above note.  ARRANGE: Raizel was educated on the importance of frequent visits to treat obesity as outlined per CMS and USPSTF guidelines and agreed to schedule her next follow up appointment today.  Otis Peak, am acting as transcriptionist for Helane Rima, DO   I have reviewed the above documentation for accuracy and completeness, and I agree with the above. Helane Rima, DO

## 2019-05-11 ENCOUNTER — Encounter (INDEPENDENT_AMBULATORY_CARE_PROVIDER_SITE_OTHER): Payer: Self-pay | Admitting: Family Medicine

## 2019-05-11 DIAGNOSIS — E038 Other specified hypothyroidism: Secondary | ICD-10-CM

## 2019-05-11 DIAGNOSIS — E039 Hypothyroidism, unspecified: Secondary | ICD-10-CM | POA: Insufficient documentation

## 2019-05-11 DIAGNOSIS — E559 Vitamin D deficiency, unspecified: Secondary | ICD-10-CM | POA: Insufficient documentation

## 2019-05-11 DIAGNOSIS — R7303 Prediabetes: Secondary | ICD-10-CM

## 2019-05-11 DIAGNOSIS — G4733 Obstructive sleep apnea (adult) (pediatric): Secondary | ICD-10-CM | POA: Insufficient documentation

## 2019-05-11 HISTORY — DX: Prediabetes: R73.03

## 2019-05-11 HISTORY — DX: Other specified hypothyroidism: E03.8

## 2019-05-16 ENCOUNTER — Ambulatory Visit (INDEPENDENT_AMBULATORY_CARE_PROVIDER_SITE_OTHER): Payer: Medicare HMO | Admitting: Psychiatry

## 2019-05-16 ENCOUNTER — Encounter (HOSPITAL_COMMUNITY): Payer: Self-pay | Admitting: Psychiatry

## 2019-05-16 ENCOUNTER — Other Ambulatory Visit: Payer: Self-pay

## 2019-05-16 DIAGNOSIS — F411 Generalized anxiety disorder: Secondary | ICD-10-CM

## 2019-05-16 DIAGNOSIS — F333 Major depressive disorder, recurrent, severe with psychotic symptoms: Secondary | ICD-10-CM | POA: Diagnosis not present

## 2019-05-16 DIAGNOSIS — F4310 Post-traumatic stress disorder, unspecified: Secondary | ICD-10-CM | POA: Diagnosis not present

## 2019-05-16 NOTE — Progress Notes (Signed)
Virtual Visit via Video Note  I connected with [name] on 05/16/19 at  1:00 PM EST by a video enabled telemedicine application and verified that I am speaking with the correct person using two identifiers.  Location: Patient: [name] Provider: Lise Auer, LCSW   I discussed the limitations of evaluation and management by telemedicine and the availability of in person appointments. The patient expressed understanding and agreed to proceed.  History of Present Illness: MDD, PTSD and GAD   Observations/Objective: Counselor met with [name] for individual therapy via Webex. Counselor assessed MH symptoms and progress on treatment plan goals. Selia presents with moderate-severe depression, moderate anxiety and increased PTSD responses. Donyelle denied suicidal ideation or self-harm behaviors. Jeraldine shared that she continues to have difficulties in establishing clear boundaries within the context of her relationship. Counselor reviewed ideas discussed at the last session and identified alternative strategies to implement. Counselor and Dior processed trauma triggers and responses within the relationship and how to express needs. Sharanda reports feelings of isolation, loneliness, sadness and anxiety during the holiday season. Counselor used CBT interventions to process emotions and cognitions. Dilpreet and Counselor worked to identify self-care and healthy coping skills to implement over the holiday week to combat depression.   Assessment and Plan: Counselor will continue to meet with patient to address treatment plan goals. Patient will continue to follow recommendations of providers and implement skills learned in session.  Follow Up Instructions: Counselor will send information for next session via Webex.     I discussed the assessment and treatment plan with the patient. The patient was provided an opportunity to ask questions and all were answered. The patient agreed with  the plan and demonstrated an understanding of the instructions.   The patient was advised to call back or seek an in-person evaluation if the symptoms worsen or if the condition fails to improve as anticipated.  I provided 39 minutes of non-face-to-face time during this encounter.   Lise Auer, LCSW

## 2019-05-28 ENCOUNTER — Telehealth: Payer: Self-pay

## 2019-05-28 ENCOUNTER — Ambulatory Visit (INDEPENDENT_AMBULATORY_CARE_PROVIDER_SITE_OTHER): Payer: Medicare Other | Admitting: Psychiatry

## 2019-05-28 ENCOUNTER — Encounter (INDEPENDENT_AMBULATORY_CARE_PROVIDER_SITE_OTHER): Payer: Self-pay | Admitting: Family Medicine

## 2019-05-28 ENCOUNTER — Other Ambulatory Visit: Payer: Self-pay

## 2019-05-28 ENCOUNTER — Ambulatory Visit (INDEPENDENT_AMBULATORY_CARE_PROVIDER_SITE_OTHER): Payer: Medicare Other | Admitting: Family Medicine

## 2019-05-28 ENCOUNTER — Encounter (HOSPITAL_COMMUNITY): Payer: Self-pay | Admitting: Psychiatry

## 2019-05-28 VITALS — BP 110/72 | HR 80 | Temp 98.5°F | Ht 65.0 in | Wt 241.0 lb

## 2019-05-28 DIAGNOSIS — F4310 Post-traumatic stress disorder, unspecified: Secondary | ICD-10-CM

## 2019-05-28 DIAGNOSIS — F3181 Bipolar II disorder: Secondary | ICD-10-CM

## 2019-05-28 DIAGNOSIS — E039 Hypothyroidism, unspecified: Secondary | ICD-10-CM

## 2019-05-28 DIAGNOSIS — E559 Vitamin D deficiency, unspecified: Secondary | ICD-10-CM | POA: Diagnosis not present

## 2019-05-28 DIAGNOSIS — Z9884 Bariatric surgery status: Secondary | ICD-10-CM

## 2019-05-28 DIAGNOSIS — F333 Major depressive disorder, recurrent, severe with psychotic symptoms: Secondary | ICD-10-CM

## 2019-05-28 DIAGNOSIS — I10 Essential (primary) hypertension: Secondary | ICD-10-CM

## 2019-05-28 DIAGNOSIS — R7303 Prediabetes: Secondary | ICD-10-CM

## 2019-05-28 DIAGNOSIS — G4700 Insomnia, unspecified: Secondary | ICD-10-CM | POA: Diagnosis not present

## 2019-05-28 DIAGNOSIS — J309 Allergic rhinitis, unspecified: Secondary | ICD-10-CM

## 2019-05-28 DIAGNOSIS — Z6841 Body Mass Index (BMI) 40.0 and over, adult: Secondary | ICD-10-CM

## 2019-05-28 DIAGNOSIS — F411 Generalized anxiety disorder: Secondary | ICD-10-CM

## 2019-05-28 DIAGNOSIS — E038 Other specified hypothyroidism: Secondary | ICD-10-CM

## 2019-05-28 MED ORDER — FLUTICASONE PROPIONATE 50 MCG/ACT NA SUSP: 1.0000 | NASAL | 0 refills | Status: DC | PRN

## 2019-05-28 MED ORDER — METFORMIN HCL 500 MG PO TABS: 500.0000 mg | ORAL_TABLET | Freq: Every day | ORAL | 0 refills | Status: DC

## 2019-05-28 MED ORDER — VITAMIN D (ERGOCALCIFEROL) 1.25 MG (50000 UNIT) PO CAPS: 50000.0000 [IU] | ORAL_CAPSULE | ORAL | 0 refills | Status: DC

## 2019-05-28 MED ORDER — ALBUTEROL SULFATE HFA 108 (90 BASE) MCG/ACT IN AERS: 2.0000 | INHALATION_SPRAY | Freq: Four times a day (QID) | RESPIRATORY_TRACT | 1 refills | Status: DC | PRN

## 2019-05-28 NOTE — Telephone Encounter (Signed)
Patient called to advise she was given 2 samples (a shot) and one of them got stuck and broke and is needing to be called in a pen (patient doesn't recall what the medication was)   Please follow up   Pharmacy: [facility] [address]), [place] - [address]  [address], [place] ([place]) [place] [address]
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
## 2019-05-28 NOTE — Progress Notes (Signed)
Chief Complaint: OBESITY [name] is here to discuss her progress with her obesity treatment plan. [name] is on the keep a food journal and adhere to recommended goals of 1100-1300 calories and 75 protein  and states she is following her eating plan approximately 50% of the time. [name] states she is walking for 1 mile 2 times per week.  Today's visit was #: 5 Starting weight: 250 lbs Starting date: 03/27/2019 Today's weight: 241 lbs Today's date: 05/28/2019 Total lbs lost to date: 9 lbs Total lbs lost since last in-office visit: 0 lbs  Subjective:   Interim History: [name] has had increased depression over the past month.  She broke up with her fiance, who was also living with her.  His eviction date is June 03, 2019.  She ate a lot of sugar for a few days, but recognized it and stopped it.    Assessment/Plan:     ICD-10-CM   1. 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 Prediabetes  R73.03 metFORMIN (GLUCOPHAGE) 500 MG tablet  3. Vitamin D deficiency  E55.9 Vitamin D, Ergocalciferol, (DRISDOL) 1.25 MG (50000 UT) CAPS capsule  4. Essential hypertension  I10   5. Subclinical hypothyroidism  E03.9   6. S/P gastric bypass  Z98.84   7. Allergic rhinitis, unspecified seasonality, unspecified trigger  J30.9 albuterol (VENTOLIN HFA) 108 (90 Base) MCG/ACT inhaler    fluticasone (FLONASE) 50 MCG/ACT nasal spray  8. Class 3 severe obesity with serious comorbidity and body mass index (BMI) of 40.0 to 44.9 in adult, unspecified obesity type (HCC)  E66.01    Z68.41    1. 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  She is seeing a therapist regularly.  She states she feels supported. No suicidal intent.  Will monitor.  2. Prediabetes Akela will continue to work on weight loss, exercise, and decreasing simple carbohydrates to help decrease the risk of diabetes.    Orders -Metformin 500 mg daily with breakfast, #30, 0 refills.  3. Vitamin D deficiency Low Vitamin D level contributes to fatigue and are associated with obesity, breast, and colon cancer. She agrees to continue to take prescription Vitamin D @50 ,000 IU every week and will follow-up for routine testing of vitamin D, at least 2-3 times per year to avoid over-replacement.  Her last vitamin D lab was on 03/27/2019 and was 17.7.    Orders -Vitamin D 50,000 IU weekly, #4, 0 refills.  4. Essential hypertension Marvell is working on healthy weight loss and exercise to improve blood pressure control. We will watch for signs of hypotension as she continues her lifestyle modifications.  At this time, her blood pressure is stable.  5. Subclinical hypothyroidism Under good control.  Will monitor.  6. S/P gastric bypass Patient doing well.  Will continue to monitor.  7. Allergic rhinitis, unspecified seasonality, unspecified trigger Patient requested medication refills today. Courtesy refill completed.  Orders -Albuterol inhaler 2 puffs into the lungs every 6 hours as needed for wheezing, 18 g, 1 refill. -Fluticasone 50 mct/act nasal spray 1 spray into both nostrils as needed, 16 g, 0 refills.  8. Class 3 severe obesity with serious comorbidity and body mass index (BMI) of 40.0 to 44.9 in adult, unspecified obesity type [facility]) [name] is currently in the action stage of change. As such, her goal is to continue with weight loss efforts. She has agreed to keep  a food journal and adhere to recommended goals of 1100-1300 calories and 75 protein . We discussed the following exercise goals today: For substantial health benefits, adults should do at least 150 minutes (2 hours and 30 minutes) a week of moderate-intensity, or 75 minutes (1 hour and 15 minutes) a week of vigorous-intensity aerobic physical activity, or an equivalent combination of moderate- and vigorous-intensity aerobic activity. Aerobic  activity should be performed in episodes of at least 10 minutes, and preferably, it should be spread throughout the week. Adults should also include muscle-strengthening activities that involve all major muscle groups on 2 or more days a week.   We discussed the following behavioral modification strategies today: increasing lean protein intake and increasing water intake.  [name] has agreed to follow-up with our clinic in 2 weeks. She was informed of the importance of frequent follow-up visits to maximize her success with intensive lifestyle modifications for her multiple health conditions.  Objective:   Blood pressure 110/72, pulse 80, temperature 98.5 F (36.9 C), temperature source Oral, height 5\' 5"  (1.651 m), weight 241 lb (109.3 kg), SpO2 95 %. Body mass index is 40.1 kg/m.  General: Cooperative, alert, well developed, in no acute distress. HEENT: Conjunctivae and lids unremarkable. Neck: No thyromegaly.  Cardiovascular: Regular rhythm.  Lungs: Normal work of breathing. Extremities: No edema.  Neurologic: No focal deficits.   Lab Results  Component Value Date   CREATININE 0.80 03/27/2019   BUN 14 03/27/2019   NA 143 03/27/2019   K 3.9 03/27/2019   CL 105 03/27/2019   CO2 24 03/27/2019   Lab Results  Component Value Date   ALT 19 03/27/2019   AST 18 03/27/2019   ALKPHOS 81 03/27/2019   BILITOT <0.2 03/27/2019   Lab Results  Component Value Date   HGBA1C 5.9 (H) 03/27/2019   Lab Results  Component Value Date   INSULIN 7.9 03/27/2019   Lab Results  Component Value Date   TSH 4.780 (H) 03/27/2019   Lab Results  Component Value Date   CHOL 187 03/27/2019   HDL 101 03/27/2019   LDLCALC 68 03/27/2019   TRIG 102 03/27/2019   Lab Results  Component Value Date   WBC 6.2 03/27/2019   HGB 11.6 03/27/2019   HCT 35.4 03/27/2019   MCV 83 03/27/2019   PLT 268 03/27/2019   Obesity Behavioral Intervention Visit Documentation for Insurance (15 Minutes):    ASK: We discussed the diagnosis of obesity with 13/07/2018 today and [name] agreed to give [name] permission to discuss obesity behavioral modification therapy today.  ASSESS: [name] has the diagnosis of obesity and her BMI today is 40.1. Marline is in the action stage of change.   ADVISE: Allyah was educated on the multiple health risks of obesity as well as the benefit of weight loss to improve her health. She was advised of the need for long term treatment and the importance of lifestyle modifications to improve her current health and to decrease her risk of future health problems.  AGREE: Multiple dietary modification options and treatment options were discussed and Keturah agreed to follow the recommendations documented in the above note.  ARRANGE: Lilyann was educated on the importance of frequent visits to treat obesity as outlined per CMS and USPSTF guidelines and agreed to schedule her next follow up appointment today.  Attestation Statements:   Reviewed by clinician on day of visit: allergies, medications, problem list, medical history, surgical history, family history, social history and previous  encounter notes.  This visit occurred during the SARS-CoV-2 public health emergency. Safety protocols were in place, including screening questions prior to the visit, additional usage of staff PPE, and extensive cleaning of exam room while observing appropriate contact time as indicated for disinfecting solutions. (CPT [id number])  I, Water quality scientist, am acting as Location manager for [employer], DO.  I have reviewed the above documentation for accuracy and completeness, and I agree with the above. Briscoe Deutscher, DO

## 2019-05-29 ENCOUNTER — Other Ambulatory Visit: Payer: Self-pay | Admitting: Neurology

## 2019-05-29 MED ORDER — AJOVY 225 MG/1.5ML ~~LOC~~ SOAJ: 225.0000 mg | SUBCUTANEOUS | 11 refills | Status: DC

## 2019-05-29 MED ORDER — AJOVY 225 MG/1.5ML ~~LOC~~ SOAJ: 225.0000 mg | SUBCUTANEOUS | 0 refills | Status: DC

## 2019-05-29 NOTE — Telephone Encounter (Signed)
I called the patient. She received November's Ajovy dose with 1 sample. December's sample was not able to be completed because the needle came out and leaked medication when she took the cap off. She would like this to be sent to the pharmacy and is aware a PA will be needed. She would like a sample to replace as well. Sample ok per Dr. Lucia Gaskins. Pt will come tomorrow to pick it up.

## 2019-05-29 NOTE — Telephone Encounter (Signed)
Yes, I sent it  thanks 

## 2019-05-29 NOTE — Progress Notes (Signed)
ajovy 

## 2019-06-01 ENCOUNTER — Encounter (INDEPENDENT_AMBULATORY_CARE_PROVIDER_SITE_OTHER): Payer: Self-pay | Admitting: Family Medicine

## 2019-06-11 ENCOUNTER — Encounter (INDEPENDENT_AMBULATORY_CARE_PROVIDER_SITE_OTHER): Payer: Self-pay | Admitting: Family Medicine

## 2019-06-11 ENCOUNTER — Ambulatory Visit (HOSPITAL_COMMUNITY): Payer: Medicare Other | Admitting: Psychiatry

## 2019-06-11 ENCOUNTER — Encounter (HOSPITAL_COMMUNITY): Payer: Self-pay | Admitting: Psychiatry

## 2019-06-11 ENCOUNTER — Ambulatory Visit (INDEPENDENT_AMBULATORY_CARE_PROVIDER_SITE_OTHER): Payer: Medicare Other | Admitting: Family Medicine

## 2019-06-11 ENCOUNTER — Other Ambulatory Visit: Payer: Self-pay

## 2019-06-11 VITALS — BP 102/64 | HR 73 | Temp 97.9°F | Ht 65.0 in | Wt 239.0 lb

## 2019-06-11 DIAGNOSIS — Z6839 Body mass index (BMI) 39.0-39.9, adult: Secondary | ICD-10-CM | POA: Diagnosis not present

## 2019-06-11 DIAGNOSIS — F333 Major depressive disorder, recurrent, severe with psychotic symptoms: Secondary | ICD-10-CM

## 2019-06-11 DIAGNOSIS — F410 Panic disorder [episodic paroxysmal anxiety] without agoraphobia: Secondary | ICD-10-CM | POA: Diagnosis not present

## 2019-06-11 DIAGNOSIS — R7303 Prediabetes: Secondary | ICD-10-CM

## 2019-06-11 DIAGNOSIS — F3181 Bipolar II disorder: Secondary | ICD-10-CM

## 2019-06-11 DIAGNOSIS — E559 Vitamin D deficiency, unspecified: Secondary | ICD-10-CM | POA: Diagnosis not present

## 2019-06-11 MED ORDER — VITAMIN D (ERGOCALCIFEROL) 1.25 MG (50000 UNIT) PO CAPS: 50000.0000 [IU] | ORAL_CAPSULE | ORAL | 0 refills | Status: DC

## 2019-06-11 NOTE — Progress Notes (Signed)
Chief Complaint:   OBESITY Lawonda is here to discuss her progress with her obesity treatment plan along with follow-up of her obesity related diagnoses. Ayah is on keeping a food journal and adhering to recommended goals of 1100-1500 calories and 75 grams of protein and states she is following her eating plan approximately 0% of the time. Jennifr states she is exercising for 0 minutes 0 times per week.  Today's visit was #: 6 Starting weight: 250 lbs Starting date: 03/27/2019 Today's weight: 239 lbs Today's date: 06/11/2019 Total lbs lost to date: 11 lbs Total lbs lost since last in-office visit: 2 lbs  Interim History: Severe depression is ongoing.  Ex-fiance is finally out of her home but still trying to contact her.  She says she is focusing on protein drinks.  Still not getting out of bed enough.  Subjective:   1. Prediabetes Kizzy has a diagnosis of prediabetes based on her elevated HgA1c and was informed this puts her at greater risk of developing diabetes. She continues to work on diet and exercise to decrease her risk of diabetes. She denies nausea or hypoglycemia.  Taking metformin.  Lab Results  Component Value Date   HGBA1C 5.9 (H) 03/27/2019   Lab Results  Component Value Date   INSULIN 7.9 03/27/2019   2. Vitamin D deficiency Darriel's Vitamin D level was 17.7 on 03/27/2019. She is currently taking vit D. She denies nausea, vomiting or muscle weakness.  3. 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  No suicidal or homicidal ideation.  4. Panic attack She had a panic attack today while in the office.  This was her first time out of the house in a few weeks.  She took Klonopin (her prn medication) and improved.  Assessment/Plan:   1. Prediabetes Ciella will continue to work on weight loss, exercise, and decreasing simple carbohydrates to help decrease the risk  of diabetes.   2. Vitamin D deficiency Jing will continue to work on weight loss, exercise, and decreasing simple carbohydrates to help decrease the risk of diabetes.   - Vitamin D, Ergocalciferol, (DRISDOL) 1.25 MG (50000 UNIT) CAPS capsule; Take 1 capsule (50,000 Units total) by mouth every 7 (seven) days.  Dispense: 4 capsule; Refill: 0  3. 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 Behavior modification techniques were discussed today.  Orders and follow up as documented in patient record.   4. Panic attack Will continue to monitor. Behavior modification techniques were discussed today.   5. Class 2 severe obesity with serious comorbidity and body mass index (BMI) of 39.0 to 39.9 in adult, unspecified obesity type [facility]) Marche is currently in the action stage of change. As such, her goal is to continue with weight loss efforts. She has agreed to keeping a food journal and adhering to recommended goals of 1100-1500 calories and 75 grams of  protein.   Exercise goals: For substantial health benefits, adults should do at least 150 minutes (2 hours and 30 minutes) a week of moderate-intensity, or 75 minutes (1 hour and 15 minutes) a week of vigorous-intensity aerobic physical activity, or an equivalent combination of moderate- and vigorous-intensity aerobic activity. Aerobic activity should be performed in episodes of at least 10 minutes, and preferably, it should be spread throughout the week. Adults should also include muscle-strengthening activities that involve all major muscle groups on 2 or more days a week.  Behavioral modification strategies: increasing lean protein intake and increasing water intake.  Hermine has agreed to follow-up with our clinic in 2 weeks. She was informed of the importance of frequent follow-up visits to maximize her success with intensive lifestyle modifications for her multiple health conditions.   Objective:    Blood pressure 102/64, pulse 73, temperature 97.9 F (36.6 C), temperature source Oral, height 5\' 5"  (1.651 m), weight 239 lb (108.4 kg), SpO2 100 %. Body mass index is 39.77 kg/m.  General: Cooperative, alert, well developed, in no acute distress. HEENT: Conjunctivae and lids unremarkable. Cardiovascular: Regular rhythm.  Lungs: Normal work of breathing. Neurologic: No focal deficits.   Lab Results  Component Value Date   CREATININE 0.80 03/27/2019   BUN 14 03/27/2019   NA 143 03/27/2019   K 3.9 03/27/2019   CL 105 03/27/2019   CO2 24 03/27/2019   Lab Results  Component Value Date   ALT 19 03/27/2019   AST 18 03/27/2019   ALKPHOS 81 03/27/2019   BILITOT <0.2 03/27/2019   Lab Results  Component Value Date   HGBA1C 5.9 (H) 03/27/2019   Lab Results  Component Value Date   INSULIN 7.9 03/27/2019   Lab Results  Component Value Date   TSH 4.780 (H) 03/27/2019   Lab Results  Component Value Date   CHOL 187 03/27/2019   HDL 101 03/27/2019   LDLCALC 68 03/27/2019   TRIG 102 03/27/2019   Lab Results  Component Value Date   WBC 6.2 03/27/2019   HGB 11.6 03/27/2019   HCT 35.4 03/27/2019   MCV 83 03/27/2019   PLT 268 03/27/2019   No results found for: IRON, TIBC, FERRITIN  Obesity Behavioral Intervention:   Approximately 15 minutes were spent on the discussion below.  ASK: We discussed the diagnosis of obesity with Wendee today and Christyann agreed to give [name] permission to discuss obesity behavioral modification therapy today.  ASSESS: Gracia has the diagnosis of obesity and her BMI today is 39.8. Nilda is in the action stage of change.   ADVISE: Tommi was educated on the multiple health risks of obesity as well as the benefit of weight loss to improve her health. She was advised of the need for long term treatment and the importance of lifestyle modifications to improve her current health and to decrease her risk of future health  problems.  AGREE: Multiple dietary modification options and treatment options were discussed and Angelicia agreed to follow the recommendations documented in the above note.  ARRANGE: Jesseka was educated on the importance of frequent visits to treat obesity as outlined per CMS and USPSTF guidelines and agreed to schedule her next follow up appointment today.  Attestation Statements:   Reviewed by clinician on day of visit: allergies, medications, problem list, medical history, surgical history, family history, social history, and previous encounter notes.  I, Water quality scientist, CMA, am acting as Location manager for [employer], DO.  I have reviewed the above documentation for accuracy and completeness, and I agree with the above. Briscoe Deutscher, DO

## 2019-06-11 NOTE — Progress Notes (Signed)
Counselor connected with [name] over the telephone, as she was unable to connect through [place] today. She stated that she was safe but not in a situation where she could talk indepth about her mental health, symptoms and goals. [name] gave updates on her living situation. Counselor explored impact on her, with [name] stating it has been a positive impact, she is communicating assertively and maintaining appropriate boundaries with former partner. [name] endorsed taking medications as prescribed, following up with providers and resting better. [name] reports her depression as being "up and down the past 2 weeks." Counselor gave her homework to further assess values, beliefs, needs and boundaries related to partnering relationship. We talked for 10 minutes with her requesting we talk more indepth at the next scheduled session due to privacy issues within her current environment.

## 2019-06-12 ENCOUNTER — Encounter (INDEPENDENT_AMBULATORY_CARE_PROVIDER_SITE_OTHER): Payer: Self-pay | Admitting: Family Medicine

## 2019-06-21 ENCOUNTER — Ambulatory Visit (INDEPENDENT_AMBULATORY_CARE_PROVIDER_SITE_OTHER): Payer: Medicare Other | Admitting: Psychiatry

## 2019-06-21 ENCOUNTER — Other Ambulatory Visit: Payer: Self-pay

## 2019-06-21 ENCOUNTER — Encounter (HOSPITAL_COMMUNITY): Payer: Self-pay | Admitting: Psychiatry

## 2019-06-21 DIAGNOSIS — F4001 Agoraphobia with panic disorder: Secondary | ICD-10-CM | POA: Diagnosis not present

## 2019-06-21 DIAGNOSIS — F333 Major depressive disorder, recurrent, severe with psychotic symptoms: Secondary | ICD-10-CM

## 2019-06-21 DIAGNOSIS — F4010 Social phobia, unspecified: Secondary | ICD-10-CM

## 2019-06-21 DIAGNOSIS — F411 Generalized anxiety disorder: Secondary | ICD-10-CM | POA: Diagnosis not present

## 2019-06-21 DIAGNOSIS — G4700 Insomnia, unspecified: Secondary | ICD-10-CM

## 2019-06-21 NOTE — Progress Notes (Signed)
Virtual Visit via Telephone Note  I connected with [name]  on 06/21/19 at  2:30 PM EST by telephone and verified that I am speaking with the correct person using two identifiers.  Location: Patient: home then driving in car; unsafe to do a video chat Provider: office   I discussed the limitations, risks, security and privacy concerns of performing an evaluation and management service by telephone and the availability of in person appointments. I also discussed with the patient that there may be a patient responsible charge related to this service. The patient expressed understanding and agreed to proceed.   History of Present Illness: Samiyyah notes that her depression is better. She is not sure if she taking Depakote but notes she is not as agitated. She is working with her therapist and that helping a lot. Pt recently ended her relationship and it has decreased her stress. She is not as irritable and feels calmer. Her motivation is very low. Shalah spends a lot of time in bed and is always fatigued.  She is starting to isolate more. She is now sleeping about 5-6 hrs with Doxepin and Melatonin. She is able to fall asleep but not stay asleep. Jamiah is having very vivid dreams and is talking in her sleep. She has been taking 20mg  to sleep because 10mg  does not work. She is not as tired the next morning when she takes the Doxepin. She often wakes up feeling refreshed. Her anxiety is high anytime she leaves the house. She is not sure if COVID is contributing. She does have panic attacks when out in public. She has some at home. The Klonopin does make the anxiety more manageable. Tyiesha denies SI/HI.     Observations/Objective: I spoke with on the phone.  Pt was calm, pleasant and cooperative.  Pt was engaged in the conversation and answered questions appropriately.  Speech was clear and coherent with normal rate, tone and volume.  Mood is depressed and anxious, affect is  congruent. Thought processes are coherent, goal oriented and intact.  Thought content is logical.  Pt denies SI/HI.   Pt denies auditory and visual hallucinations and did not appear to be responding to internal stimuli.  Memory and concentration are good.  Fund of knowledge and use of language are average.  Insight and judgment are fair.  I am unable to comment on psychomotor activity, general appearance, hygiene, or eye contact as I was unable to physically see the patient on the phone.  Vital signs not available since interview conducted virtually.     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 I will wait to make any med changes until then.  Follow Up Instructions: In  8 weeks or sooner if needed   I discussed the assessment and treatment plan with the patient. The patient was provided an opportunity to ask questions and all were answered. The patient agreed with the plan and demonstrated an understanding of the instructions.   The patient was advised to call back or  seek an in-person evaluation if the symptoms worsen or if the condition fails to improve as anticipated.  I provided 30 minutes of non-face-to-face time during this encounter.   Charlcie Cradle, MD

## 2019-06-25 ENCOUNTER — Telehealth (HOSPITAL_COMMUNITY): Payer: Self-pay | Admitting: *Deleted

## 2019-06-25 NOTE — Telephone Encounter (Signed)
Pt called requesting refills on her medications. Pt was last seen on 06/21/19 but has no upcoming appointments. Please review and advise.

## 2019-06-26 ENCOUNTER — Other Ambulatory Visit: Payer: Self-pay

## 2019-06-26 ENCOUNTER — Ambulatory Visit (HOSPITAL_COMMUNITY): Payer: Medicare Other | Admitting: Psychiatry

## 2019-06-26 ENCOUNTER — Encounter (HOSPITAL_COMMUNITY): Payer: Self-pay | Admitting: Psychiatry

## 2019-06-26 DIAGNOSIS — F333 Major depressive disorder, recurrent, severe with psychotic symptoms: Secondary | ICD-10-CM

## 2019-06-26 NOTE — Progress Notes (Signed)
[name] attempted to engage in session today, however, she was experiencing Cold/Sinus/Allergy symptoms, which magnified noises, causing headache and pain. She gave therapist brief updates and then requested to end the session about 5 minutes due to discomfort. She plans to contact between now and next session with updates and needs. Counselor to continue work on appropriate transfer to in house provider due to upcoming maternity leave.

## 2019-06-27 ENCOUNTER — Encounter (INDEPENDENT_AMBULATORY_CARE_PROVIDER_SITE_OTHER): Payer: Self-pay | Admitting: Family Medicine

## 2019-06-27 ENCOUNTER — Other Ambulatory Visit: Payer: Self-pay

## 2019-06-27 ENCOUNTER — Ambulatory Visit (INDEPENDENT_AMBULATORY_CARE_PROVIDER_SITE_OTHER): Payer: Medicare Other | Admitting: Family Medicine

## 2019-06-27 VITALS — BP 117/79 | HR 70 | Temp 98.5°F | Ht 65.0 in | Wt 240.0 lb

## 2019-06-27 DIAGNOSIS — F3181 Bipolar II disorder: Secondary | ICD-10-CM

## 2019-06-27 DIAGNOSIS — Z9884 Bariatric surgery status: Secondary | ICD-10-CM | POA: Diagnosis not present

## 2019-06-27 DIAGNOSIS — G43809 Other migraine, not intractable, without status migrainosus: Secondary | ICD-10-CM

## 2019-06-27 DIAGNOSIS — E559 Vitamin D deficiency, unspecified: Secondary | ICD-10-CM | POA: Diagnosis not present

## 2019-06-27 DIAGNOSIS — R7303 Prediabetes: Secondary | ICD-10-CM

## 2019-06-27 DIAGNOSIS — R7989 Other specified abnormal findings of blood chemistry: Secondary | ICD-10-CM | POA: Diagnosis not present

## 2019-06-27 DIAGNOSIS — Z6841 Body Mass Index (BMI) 40.0 and over, adult: Secondary | ICD-10-CM

## 2019-06-27 MED ORDER — VITAMIN D (ERGOCALCIFEROL) 1.25 MG (50000 UNIT) PO CAPS: 50000.0000 [IU] | ORAL_CAPSULE | ORAL | 0 refills | Status: DC

## 2019-06-27 NOTE — Progress Notes (Signed)
Chief Complaint:   OBESITY [name] is here to discuss her progress with her obesity treatment plan along with follow-up of her obesity related diagnoses. [name] is on keeping a food journal and adhering to recommended goals of 1100-1500 calories and 75 grams of protein and states she is following her eating plan approximately 60% of the time. [name] states she is exercising for 0 minutes 0 times per week.  Today's visit was #: 7 Starting weight: 250 lbs Starting date: 03/27/2019 Today's weight: 240 lbs Today's date: 06/27/2019 Total lbs lost to date: 10 lbs Total lbs lost since last in-office visit: 0  Interim History: [name] is still not getting out of bed much.  She states she has not been hungry.  She had a birthday and went out to eat.  She is still working with a therapist regularly.  Subjective:   1. Prediabetes [name] has a diagnosis of prediabetes based on her elevated HgA1c and was informed this puts her at greater risk of developing diabetes. She continues to work on diet and exercise to decrease her risk of diabetes. She denies nausea or hypoglycemia.  Lab Results  Component Value Date   HGBA1C 5.9 (H) 03/27/2019   Lab Results  Component Value Date   INSULIN 7.9 03/27/2019   2. Vitamin D deficiency [name]'s Vitamin D level was 17.7 on 03/27/2019. She is currently taking vit D. She denies nausea, vomiting or muscle weakness.  3. S/P gastric bypass [name] is not taking a bariatric multivitamin.  4. Elevated TSH Lab Results  Component Value Date   TSH 4.780 (H) 03/27/2019   5. Other migraine without status migrainosus, not intractable [name] takes Ajovy for migraines.  She says her migraines have been worse over the last few days.  She is followed by Dr. Jaynee Eagles and also gets Botox injections.    6. 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  Assessment/Plan:   1. Prediabetes [name] will  continue to work on weight loss, exercise, and decreasing simple carbohydrates to help decrease the risk of diabetes.   Orders - Comprehensive metabolic panel - Hemoglobin A1c  2. Vitamin D deficiency Low Vitamin D level contributes to fatigue and are associated with obesity, breast, and colon cancer. She agrees to continue to take prescription Vitamin D @50 ,000 IU every week and will follow-up for routine testing of Vitamin D, at least 2-3 times per year to avoid over-replacement.  Orders - VITAMIN D 25 Hydroxy (Vit-D Deficiency, Fractures)  3. S/P gastric bypass [name] is at risk for malnutrition due to her previous bariatric surgery. Recommend once daily bariatric multivitamin capsule.  Counseling  You may need to eat 3 meals and 2 snacks, or 5 small meals each day in order to reach your protein and calorie goals.   Allow at least 15 minutes for each meal so that you can eat mindfully. Listen to your body so that you do not overeat. For most people, your sleeve or pouch will comfortably hold 4-6 ounces.  Eat foods from all food groups. This includes fruits and vegetables, grains, dairy, and meat and other proteins.  Include a protein-rich food at every meal and snack, and eat the protein food first.   You should be taking a Bariatric Multivitamin as well as calcium.   Orders - CBC with Differential/Platelet  4. Elevated TSH Will recheck labs today. - TSH - T3 - T4, free  5. Other migraine without status migrainosus, not  intractable Consider retrial of Topamax in the future.  6. 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  7. Class 3 severe obesity with serious comorbidity and body mass index (BMI) of 40.0 to 44.9 in adult, unspecified obesity type [facility]) [name] is currently in the action stage of change. As such, her goal is to continue with weight loss efforts. She has agreed to keeping a food journal and adhering to recommended  goals of 1100-1500 calories and 75 grams of protein.   Exercise goals: For substantial health benefits, adults should do at least 150 minutes (2 hours and 30 minutes) a week of moderate-intensity, or 75 minutes (1 hour and 15 minutes) a week of vigorous-intensity aerobic physical activity, or an equivalent combination of moderate- and vigorous-intensity aerobic activity. Aerobic activity should be performed in episodes of at least 10 minutes, and preferably, it should be spread throughout the week. Adults should also include muscle-strengthening activities that involve all major muscle groups on 2 or more days a week.  Behavioral modification strategies: meal planning and cooking strategies.  [name] has agreed to follow-up with our clinic in 2 weeks. She was informed of the importance of frequent follow-up visits to maximize her success with intensive lifestyle modifications for her multiple health conditions.   [name] was informed we would discuss her lab results at her next visit unless there is a critical issue that needs to be addressed sooner. [name] agreed to keep her next visit at the agreed upon time to discuss these results.  Objective:   Blood pressure 117/79, pulse 70, temperature 98.5 F (36.9 C), temperature source Oral, height 5\' 5"  (1.651 m), weight 240 lb (108.9 kg), SpO2 98 %. Body mass index is 39.94 kg/m.  General: Cooperative, alert, well developed, in no acute distress. HEENT: Conjunctivae and lids unremarkable. Cardiovascular: Regular rhythm.  Lungs: Normal work of breathing. Neurologic: No focal deficits.   Lab Results  Component Value Date   CREATININE 0.80 03/27/2019   BUN 14 03/27/2019   NA 143 03/27/2019   K 3.9 03/27/2019   CL 105 03/27/2019   CO2 24 03/27/2019   Lab Results  Component Value Date   ALT 19 03/27/2019   AST 18 03/27/2019   ALKPHOS 81 03/27/2019   BILITOT <0.2 03/27/2019   Lab Results  Component Value Date   HGBA1C 5.9 (H)  03/27/2019   Lab Results  Component Value Date   INSULIN 7.9 03/27/2019   Lab Results  Component Value Date   TSH 4.780 (H) 03/27/2019   Lab Results  Component Value Date   CHOL 187 03/27/2019   HDL 101 03/27/2019   LDLCALC 68 03/27/2019   TRIG 102 03/27/2019   Lab Results  Component Value Date   WBC 6.2 03/27/2019   HGB 11.6 03/27/2019   HCT 35.4 03/27/2019   MCV 83 03/27/2019   PLT 268 03/27/2019   Obesity Behavioral Intervention Documentation for Insurance:   Approximately 15 minutes were spent on the discussion below.  ASK: We discussed the diagnosis of obesity with [name] today and [name] agreed to give [name] permission to discuss obesity behavioral modification therapy today.  ASSESS: [name] has the diagnosis of obesity and her BMI today is 40.1. Brigetta is in the action stage of change.   ADVISE: [name] was educated on the multiple health risks of obesity as well as the benefit of weight loss to improve her health. She was advised of the need for long term treatment and the importance  of lifestyle modifications to improve her current health and to decrease her risk of future health problems.  AGREE: Multiple dietary modification options and treatment options were discussed and [name] agreed to follow the recommendations documented in the above note.  ARRANGE: [name] was educated on the importance of frequent visits to treat obesity as outlined per CMS and USPSTF guidelines and agreed to schedule her next follow up appointment today.  Attestation Statements:   Reviewed by clinician on day of visit: allergies, medications, problem list, medical history, surgical history, family history, social history, and previous encounter notes.  I, Insurance claims handler, CMA, am acting as Energy manager for [employer], DO.  I have reviewed the above documentation for accuracy and completeness, and I agree with the above. Helane Rima, DO

## 2019-06-28 ENCOUNTER — Telehealth (HOSPITAL_COMMUNITY): Payer: Self-pay | Admitting: *Deleted

## 2019-06-28 LAB — CBC WITH DIFFERENTIAL/PLATELET
Basophils Absolute: 0 10*3/uL (ref 0.0–0.2)
Basos: 1 %
EOS (ABSOLUTE): 0.2 10*3/uL (ref 0.0–0.4)
Eos: 3 %
Hematocrit: 40.6 % (ref 34.0–46.6)
Hemoglobin: 13.1 g/dL (ref 11.1–15.9)
Immature Grans (Abs): 0 10*3/uL (ref 0.0–0.1)
Immature Granulocytes: 0 %
Lymphocytes Absolute: 1.9 10*3/uL (ref 0.7–3.1)
Lymphs: 33 %
MCH: 28.1 pg (ref 26.6–33.0)
MCHC: 32.3 g/dL (ref 31.5–35.7)
MCV: 87 fL (ref 79–97)
Monocytes Absolute: 0.7 10*3/uL (ref 0.1–0.9)
Monocytes: 12 %
Neutrophils Absolute: 2.9 10*3/uL (ref 1.4–7.0)
Neutrophils: 51 %
Platelets: 256 10*3/uL (ref 150–450)
RBC: 4.67 x10E6/uL (ref 3.77–5.28)
RDW: 14.2 % (ref 11.7–15.4)
WBC: 5.6 10*3/uL (ref 3.4–10.8)

## 2019-06-28 LAB — COMPREHENSIVE METABOLIC PANEL
ALT: 11 IU/L (ref 0–32)
AST: 14 IU/L (ref 0–40)
Albumin/Globulin Ratio: 1.4 (ref 1.2–2.2)
Albumin: 4.1 g/dL (ref 3.8–4.9)
Alkaline Phosphatase: 72 IU/L (ref 39–117)
BUN/Creatinine Ratio: 14 (ref 9–23)
BUN: 11 mg/dL (ref 6–24)
Bilirubin Total: <0.2 mg/dL (ref 0.0–1.2)
CO2: 24 mmol/L (ref 20–29)
Calcium: 9.1 mg/dL (ref 8.7–10.2)
Chloride: 105 mmol/L (ref 96–106)
Creatinine, Ser: 0.76 mg/dL (ref 0.57–1.00)
GFR calc Af Amer: 101 mL/min/{1.73_m2} (ref >59)
GFR calc non Af Amer: 88 mL/min/{1.73_m2} (ref >59)
Globulin, Total: 2.9 g/dL (ref 1.5–4.5)
Glucose: 77 mg/dL (ref 65–99)
Potassium: 4.3 mmol/L (ref 3.5–5.2)
Sodium: 143 mmol/L (ref 134–144)
Total Protein: 7 g/dL (ref 6.0–8.5)

## 2019-06-28 LAB — HEMOGLOBIN A1C
Est. average glucose Bld gHb Est-mCnc: 111 mg/dL
Hgb A1c MFr Bld: 5.5 % (ref 4.8–5.6)

## 2019-06-28 LAB — TSH: TSH: 3.62 u[IU]/mL (ref 0.450–4.500)

## 2019-06-28 LAB — T3: T3, Total: 83 ng/dL (ref 71–180)

## 2019-06-28 LAB — VITAMIN D 25 HYDROXY (VIT D DEFICIENCY, FRACTURES): Vit D, 25-Hydroxy: 20.1 ng/mL — ABNORMAL LOW (ref 30.0–100.0)

## 2019-06-28 LAB — T4, FREE: Free T4: 0.72 ng/dL — ABNORMAL LOW (ref 0.82–1.77)

## 2019-06-28 NOTE — Telephone Encounter (Signed)
Pt called requesting refills of Cymbalta 30mg  and Doxepin 10mg .Pt has an upcoming appointment on 07/27/19. Please review.

## 2019-06-28 NOTE — Telephone Encounter (Signed)
I need confirmation on what psych meds she is taking. She couldn't remember during our appointment

## 2019-06-29 ENCOUNTER — Other Ambulatory Visit (HOSPITAL_COMMUNITY): Payer: Self-pay | Admitting: Psychiatry

## 2019-06-29 DIAGNOSIS — F4001 Agoraphobia with panic disorder: Secondary | ICD-10-CM

## 2019-06-29 DIAGNOSIS — F411 Generalized anxiety disorder: Secondary | ICD-10-CM

## 2019-06-29 DIAGNOSIS — G4700 Insomnia, unspecified: Secondary | ICD-10-CM

## 2019-06-29 DIAGNOSIS — F333 Major depressive disorder, recurrent, severe with psychotic symptoms: Secondary | ICD-10-CM

## 2019-07-03 ENCOUNTER — Telehealth (HOSPITAL_COMMUNITY): Payer: Self-pay

## 2019-07-03 NOTE — Telephone Encounter (Signed)
Patient called requesting refills on her Divalproex ER 250mg , Duloxetine 30mg , and her Doxepine 10mg /ml. She stated that she's been taking 2 to 2 1/2 ml instead of 1 ml to help her sleep better. Thank you.

## 2019-07-05 ENCOUNTER — Telehealth: Payer: Self-pay | Admitting: *Deleted

## 2019-07-05 NOTE — Telephone Encounter (Signed)
Ajovy PA completed on CMM. Key: [id number] - PA Case ID: [id number] - Rx #: [id number]. Awaiting determination from [facility].

## 2019-07-09 ENCOUNTER — Telehealth: Payer: Self-pay

## 2019-07-09 NOTE — Telephone Encounter (Signed)
I called the patient's insurance and spoke with Annice Pih. who stated that the patient was active and codes [phone] and [id number] were valid and billable with no pre cert required. ESL#[id number]

## 2019-07-10 ENCOUNTER — Ambulatory Visit (INDEPENDENT_AMBULATORY_CARE_PROVIDER_SITE_OTHER): Payer: Medicare Other | Admitting: Psychiatry

## 2019-07-10 ENCOUNTER — Other Ambulatory Visit: Payer: Self-pay

## 2019-07-10 ENCOUNTER — Encounter (HOSPITAL_COMMUNITY): Payer: Self-pay | Admitting: Psychiatry

## 2019-07-10 ENCOUNTER — Ambulatory Visit (INDEPENDENT_AMBULATORY_CARE_PROVIDER_SITE_OTHER): Payer: Medicare Other | Admitting: Neurology

## 2019-07-10 VITALS — Temp 97.4°F

## 2019-07-10 DIAGNOSIS — F411 Generalized anxiety disorder: Secondary | ICD-10-CM | POA: Diagnosis not present

## 2019-07-10 DIAGNOSIS — F333 Major depressive disorder, recurrent, severe with psychotic symptoms: Secondary | ICD-10-CM

## 2019-07-10 DIAGNOSIS — G43711 Chronic migraine without aura, intractable, with status migrainosus: Secondary | ICD-10-CM

## 2019-07-10 MED ORDER — NURTEC 75 MG PO TBDP: 75.0000 mg | ORAL_TABLET | Freq: Every day | ORAL | 6 refills | Status: DC | PRN

## 2019-07-10 MED ORDER — NURTEC 75 MG PO TBDP: 75.0000 mg | ORAL_TABLET | Freq: Every day | ORAL | 0 refills | Status: DC | PRN

## 2019-07-10 MED ORDER — AJOVY 225 MG/1.5ML ~~LOC~~ SOAJ: 225.0000 mg | SUBCUTANEOUS | 0 refills | Status: DC

## 2019-07-10 MED ORDER — KETOROLAC TROMETHAMINE 60 MG/2ML IM SOLN
60.0000 mg | Freq: Once | INTRAMUSCULAR | Status: AC
  Administered 2019-07-10: 60 mg via INTRAMUSCULAR

## 2019-07-10 NOTE — Progress Notes (Addendum)
Botox- 200 units x 1 vial Lot: [id number] Expiration: 02/2022 NDC: 9122-5834-62  Bacteriostatic 0.9% Sodium Chloride- 23mL total Lot: [id number] Expiration: 08/23/2019 NDC: 5271-2929-09  Dx: M30.149 B/B  Patient was given Toradol 60 mg IM injection in LUOQ of L buttock. She tolerated it well. Bandaid applied. Aseptic technique used. Verbal order per Dr. Lucia Gaskins placed for the Toradol.

## 2019-07-10 NOTE — Progress Notes (Signed)
Consent Form Botulism Toxin Injection For Chronic Migraine  Interval history 07/10/2019: This is our second botox of this botox series.Did great, about 50% improvement in frequency but also in severity. Ajovy not approve will give emgality instead. Gave her 6 Ajovy. Will give Nurtec, she has failed imitrex and maxalt and cannot take anymore triptans, contraindicated due to side effects. She feels clenching is a problem and she has pin when clenching in the temples as well. Gave her 6 ajovy samples and some nurtec too. She has HTN on aimovig, stopped. She had botox in the past and we restarted on 03/2019, doing great.   Meds ordered this encounter  Medications  . Rimegepant Sulfate (NURTEC) 75 MG TBDP    Sig: Take 75 mg by mouth daily as needed. For migraines. Take as close to onset of migraine as possible. One daily maximum.    Dispense:  10 tablet    Refill:  6  . Rimegepant Sulfate (NURTEC) 75 MG TBDP    Sig: Take 75 mg by mouth daily as needed. For migraines. Take as close to onset of migraine as possible. One daily maximum.    Dispense:  6 tablet    Refill:  0  . Fremanezumab-vfrm (AJOVY) 225 MG/1.5ML SOAJ    Sig: Inject 225 mg into the skin every 30 (thirty) days.    Dispense:  6 pen    Refill:  0     Reviewed orally with patient, additionally signature is on file:  Botulism toxin has been approved by the Federal drug administration for treatment of chronic migraine. Botulism toxin does not cure chronic migraine and it may not be effective in some patients.  The administration of botulism toxin is accomplished by injecting a small amount of toxin into the muscles of the neck and head. Dosage must be titrated for each individual. Any benefits resulting from botulism toxin tend to wear off after 3 months with a repeat injection required if benefit is to be maintained. Injections are usually done every 3-4 months with maximum effect peak achieved by about 2 or 3 weeks. Botulism toxin is  expensive and you should be sure of what costs you will incur resulting from the injection.  The side effects of botulism toxin use for chronic migraine may include:   -Transient, and usually mild, facial weakness with facial injections  -Transient, and usually mild, head or neck weakness with head/neck injections  -Reduction or loss of forehead facial animation due to forehead muscle weakness  -Eyelid drooping  -Dry eye  -Pain at the site of injection or bruising at the site of injection  -Double vision  -Potential unknown long term risks  Contraindications: You should not have Botox if you are pregnant, nursing, allergic to albumin, have an infection, skin condition, or muscle weakness at the site of the injection, or have myasthenia gravis, Lambert-Eaton syndrome, or ALS.  It is also possible that as with any injection, there may be an allergic reaction or no effect from the medication. Reduced effectiveness after repeated injections is sometimes seen and rarely infection at the injection site may occur. All care will be taken to prevent these side effects. If therapy is given over a long time, atrophy and wasting in the muscle injected may occur. Occasionally the patient's become refractory to treatment because they develop antibodies to the toxin. In this event, therapy needs to be modified.  I have read the above information and consent to the administration of botulism toxin.  BOTOX PROCEDURE NOTE FOR MIGRAINE HEADACHE    Contraindications and precautions discussed with patient(above). Aseptic procedure was observed and patient tolerated procedure. Procedure performed by Dr. Georgia Dom  The condition has existed for more than 6 months, and pt does not have a diagnosis of ALS, Myasthenia Gravis or Lambert-Eaton Syndrome.  Risks and benefits of injections discussed and pt agrees to proceed with the procedure.  Written consent obtained  These injections are medically necessary. Pt   receives good benefits from these injections. These injections do not cause sedations or hallucinations which the oral therapies may cause.  Description of procedure:  The patient was placed in a sitting position. The standard protocol was used for Botox as follows, with 5 units of Botox injected at each site:   -Procerus muscle, midline injection  -Corrugator muscle, bilateral injection  -Frontalis muscle, bilateral injection, with 2 sites each side, medial injection was performed in the upper one third of the frontalis muscle, in the region vertical from the medial inferior edge of the superior orbital rim. The lateral injection was again in the upper one third of the forehead vertically above the lateral limbus of the cornea, 1.5 cm lateral to the medial injection site.  -Temporalis muscle injection, 4 sites, bilaterally. The first injection was 3 cm above the tragus of the ear, second injection site was 1.5 cm to 3 cm up from the first injection site in line with the tragus of the ear. The third injection site was 1.5-3 cm forward between the first 2 injection sites. The fourth injection site was 1.5 cm posterior to the second injection site.   -Occipitalis muscle injection, 3 sites, bilaterally. The first injection was done one half way between the occipital protuberance and the tip of the mastoid process behind the ear. The second injection site was done lateral and superior to the first, 1 fingerbreadth from the first injection. The third injection site was 1 fingerbreadth superiorly and medially from the first injection site.  -Cervical paraspinal muscle injection, 2 sites, bilateral knee first injection site was 1 cm from the midline of the cervical spine, 3 cm inferior to the lower border of the occipital protuberance. The second injection site was 1.5 cm superiorly and laterally to the first injection site.  -Trapezius muscle injection was performed at 3 sites, bilaterally. The first  injection site was in the upper trapezius muscle halfway between the inflection point of the neck, and the acromion. The second injection site was one half way between the acromion and the first injection site. The third injection was done between the first injection site and the inflection point of the neck.   Will return for repeat injection in 3 months.   200 units of Botox was used, any Botox not injected was wasted. The patient tolerated the procedure well, there were no complications of the above procedure.

## 2019-07-10 NOTE — Progress Notes (Signed)
Virtual Visit via Video Note  I connected with [name] on 07/10/19 at  4:00 PM EST by a video enabled telemedicine application and verified that I am speaking with the correct person using two identifiers.  Location: Patient: Patient Home Provider: Home Office   I discussed the limitations of evaluation and management by telemedicine and the availability of in person appointments. The patient expressed understanding and agreed to proceed.  History of Present Illness: MDD and GAD  Observations/Objective: Counselor met with Karliah for individual therapy via Webex. Counselor assessed MH symptoms and progress on treatment plan goals, with patient reporting that she has been since for over a week, lowering her coping abilities and daily functioning. Canna presents with moderate depression and moderate anxiety. Juliona denied suicidal ideation or self-harm behaviors.   Khaniyah shared that she has been suffering from migraines for a couple weeks, and just returned home from receiving treatment via injections. Aishi noted that she was starting to feel relief, however, she had increased anxiety during the treatments. Counselor and Bobetta worked to ground and calm self before starting with the session. Counselor assessed impact of illness on her daily functioning, med management and mood, which Dorine noting she's been in bed most of the time, attempted to shower daily or EOD, has felt more depressed, increased anxiety/racing thoughts and had difficulty with sleep/sleep talking. Counselor and Anzal discussed a plan to reestablish daily care routine and recommended following up with medical providers for medication concerns. Counselor and Jazzmyne identified additional coping strategies to implement to decrease anxiety, including setting boundaries, communicating needs, mindfulness practices, connecting with faith and to journal thoughts about personal goals, standards, expectations and needs  within the context of intimate partnership.   Counselor and Leticia discussed discharge planning, as today is our last session, due to Counselor temporary leave. Martena will be transferred internally, to [facility] for continuity of care.Lovena Le will be in touch to establish first session.   Assessment and Plan: New Counselor will contact to meet with patient to address/reestablish treatment plan goals. Patient will continue to follow recommendations of providers and implement skills learned in session.  Follow Up Instructions: New Counselor will send information for next session via Webex.    The patient was advised to call back or seek an in-person evaluation if the symptoms worsen or if the condition fails to improve as anticipated.  I provided 55 minutes of non-face-to-face time during this encounter.   Lise Auer, LCSW

## 2019-07-10 NOTE — Patient Instructions (Signed)
Rimegepant oral dissolving tablet What is this medicine? RIMEGEPANT (ri ME je pant) is used to treat migraine headaches with or without aura. An aura is a strange feeling or visual disturbance that warns you of an attack. It is not used to prevent migraines. This medicine may be used for other purposes; ask your health care provider or pharmacist if you have questions. COMMON BRAND NAME(S): NURTEC ODT What should I tell my health care provider before I take this medicine? They need to know if you have any of these conditions:  kidney disease  liver disease  an unusual or allergic reaction to rimegepant, other medicines, foods, dyes, or preservatives  pregnant or trying to get pregnant  breast-feeding How should I use this medicine? Take the medicine by mouth. Follow the directions on the prescription label. Leave the tablet in the sealed blister pack until you are ready to take it. With dry hands, open the blister and gently remove the tablet. If the tablet breaks or crumbles, throw it away and take a new tablet out of the blister pack. Place the tablet in the mouth and allow it to dissolve, and then swallow. Do not cut, crush, or chew this medicine. You do not need water to take this medicine. Talk to your pediatrician about the use of this medicine in children. Special care may be needed. Overdosage: If you think you have taken too much of this medicine contact a poison control center or emergency room at once. NOTE: This medicine is only for you. Do not share this medicine with others. What if I miss a dose? This does not apply. This medicine is not for regular use. What may interact with this medicine? This medicine may interact with the following medications:  certain medicines for fungal infections like fluconazole, itraconazole  rifampin This list may not describe all possible interactions. Give your health care provider a list of all the medicines, herbs, non-prescription drugs,  or dietary supplements you use. Also tell them if you smoke, drink alcohol, or use illegal drugs. Some items may interact with your medicine. What should I watch for while using this medicine? Visit your health care professional for regular checks on your progress. Tell your health care professional if your symptoms do not start to get better or if they get worse. What side effects may I notice from receiving this medicine? Side effects that you should report to your doctor or health care professional as soon as possible:  allergic reactions like skin rash, itching or hives; swelling of the face, lips, or tongue Side effects that usually do not require medical attention (report these to your doctor or health care professional if they continue or are bothersome):  nausea This list may not describe all possible side effects. Call your doctor for medical advice about side effects. You may report side effects to FDA at 1-800-FDA-1088. Where should I keep my medicine? Keep out of the reach of children. Store at room temperature between 15 and 30 degrees C (59 and 86 degrees F). Throw away any unused medicine after the expiration date. NOTE: This sheet is a summary. It may not cover all possible information. If you have questions about this medicine, talk to your doctor, pharmacist, or health care provider.  2020 Elsevier/Gold Standard (2018-07-24 00:21:31)  

## 2019-07-11 ENCOUNTER — Other Ambulatory Visit: Payer: Self-pay

## 2019-07-11 ENCOUNTER — Ambulatory Visit (INDEPENDENT_AMBULATORY_CARE_PROVIDER_SITE_OTHER): Payer: Medicare Other | Admitting: Family Medicine

## 2019-07-11 ENCOUNTER — Encounter (INDEPENDENT_AMBULATORY_CARE_PROVIDER_SITE_OTHER): Payer: Self-pay | Admitting: Family Medicine

## 2019-07-11 VITALS — BP 105/73 | HR 88 | Temp 98.0°F | Ht 65.0 in | Wt 246.0 lb

## 2019-07-11 DIAGNOSIS — Z9884 Bariatric surgery status: Secondary | ICD-10-CM | POA: Diagnosis not present

## 2019-07-11 DIAGNOSIS — E559 Vitamin D deficiency, unspecified: Secondary | ICD-10-CM

## 2019-07-11 DIAGNOSIS — R609 Edema, unspecified: Secondary | ICD-10-CM | POA: Diagnosis not present

## 2019-07-11 DIAGNOSIS — R7303 Prediabetes: Secondary | ICD-10-CM

## 2019-07-11 DIAGNOSIS — F3181 Bipolar II disorder: Secondary | ICD-10-CM

## 2019-07-11 DIAGNOSIS — Z6841 Body Mass Index (BMI) 40.0 and over, adult: Secondary | ICD-10-CM

## 2019-07-11 DIAGNOSIS — G43809 Other migraine, not intractable, without status migrainosus: Secondary | ICD-10-CM

## 2019-07-11 DIAGNOSIS — M797 Fibromyalgia: Secondary | ICD-10-CM

## 2019-07-11 MED ORDER — HYDROCHLOROTHIAZIDE 12.5 MG PO CAPS: 12.5000 mg | ORAL_CAPSULE | Freq: Every day | ORAL | 0 refills | Status: DC | PRN

## 2019-07-11 MED ORDER — DIVALPROEX SODIUM ER 250 MG PO TB24: 750.0000 mg | ORAL_TABLET | Freq: Every day | ORAL | 0 refills | Status: DC

## 2019-07-11 MED ORDER — VITAMIN D (ERGOCALCIFEROL) 1.25 MG (50000 UNIT) PO CAPS: 50000.0000 [IU] | ORAL_CAPSULE | ORAL | 0 refills | Status: DC

## 2019-07-11 NOTE — Progress Notes (Signed)
Chief Complaint:   OBESITY [name] is here to discuss her progress with her obesity treatment plan along with follow-up of her obesity related diagnoses. [name] is on keeping a food journal and adhering to recommended goals of 1100-1500 calories and 75 grams of protein and states she is following her eating plan approximately 30% of the time. [name] states she is exercising for 0 minutes 0 times per week.  Today's visit was #: 8 Starting weight: 250 lbs Starting date: 03/27/2019 Today's weight: 246 lbs Today's date: 07/11/2019 Total lbs lost to date: 4 lbs Total lbs lost since last in-office visit: 0  Interim History: [name] says she is not eating as much throughout the day but she is hungry at night.  A fibro flare has been keeping her in bed.  She is not sleeping due to racing thoughts.  She has started keto supplements.  Subjective:   1. Vitamin D deficiency [name]'s Vitamin D level was 20.1 on 06/27/2019. She is currently taking vit D. She denies nausea, vomiting or muscle weakness.  2. Prediabetes [name] has a diagnosis of prediabetes based on her elevated HgA1c and was informed this puts her at greater risk of developing diabetes. She continues to work on diet and exercise to decrease her risk of diabetes. She denies nausea or hypoglycemia.  Lab Results  Component Value Date   HGBA1C 5.5 06/27/2019   Lab Results  Component Value Date   INSULIN 7.9 03/27/2019   3. S/P gastric bypass The patient says she has ordered a bariatric multivitamin.  4. Edema, unspecified type [name] has some edema in her extremities.  5. Other migraine without status migrainosus, not intractable [name] has migraine headaches and is followed by Neurology.  6. Fibromyalgia The symptoms are of moderate severity.  7. 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 Michae Kava for her bipolar disorder.  Assessment/Plan:   1. Vitamin D  deficiency Low Vitamin D level contributes to fatigue and are associated with obesity, breast, and colon cancer. She agrees to continue to take prescription Vitamin D @50 ,000 IU every week and will follow-up for routine testing of Vitamin D, at least 2-3 times per year to avoid over-replacement.  Orders - Vitamin D, Ergocalciferol, (DRISDOL) 1.25 MG (50000 UNIT) CAPS capsule; Take 1 capsule (50,000 Units total) by mouth every 7 (seven) days.  Dispense: 4 capsule; Refill: 0  2. Prediabetes [name] will continue to work on weight loss, exercise, and decreasing simple carbohydrates to help decrease the risk of diabetes.    3. S/P gastric bypass [name] is at risk for malnutrition due to her previous bariatric surgery.    Counseling  You may need to eat 3 meals and 2 snacks, or 5 small meals each day in order to reach your protein and calorie goals.   Allow at least 15 minutes for each meal so that you can eat mindfully. Listen to your body so that you do not overeat. For most people, your sleeve or pouch will comfortably hold 4-6 ounces.  Eat foods from all food groups. This includes fruits and vegetables, grains, dairy, and meat and other proteins.  Include a protein-rich food at every meal and snack, and eat the protein food first.   You should be taking a Bariatric Multivitamin as well as calcium.   4. Edema, unspecified type Start HCTZ as needed.  Orders - hydrochlorothiazide (MICROZIDE) 12.5 MG capsule; Take 1 capsule (12.5 mg total) by mouth daily as  needed (edema).  Dispense: 30 capsule; Refill: 0  5. Other migraine without status migrainosus, not intractable Continue to follow with Neurology.  6. Fibromyalgia Intensive lifestyle modifications are the first line treatment for this issue. We discussed several lifestyle modifications today and she will continue to work on diet, exercise and weight loss efforts.We will continue to monitor. Orders and follow up as documented in  patient record.   Counseling . Try [email or web address]/, which is a series of self-care modules designed to teach patients several techniques to manage pain.   7. 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  Orders - divalproex (DEPAKOTE ER) 250 MG 24 hr tablet; Take 3 tablets (750 mg total) by mouth daily.  Dispense: 90 tablet; Refill: 0  8. Class 3 severe obesity with serious comorbidity and body mass index (BMI) of 40.0 to 44.9 in adult, unspecified obesity type [name]) [name] is currently in the action stage of change. As such, her goal is to continue with weight loss efforts. She has agreed to keeping a food journal and adhering to recommended goals of 1100-1300 calories and 75 grams of protein.   Exercise goals: All adults should avoid inactivity. Some physical activity is better than none, and adults who participate in any amount of physical activity gain some health benefits.  Behavioral modification strategies: increasing lean protein intake and increasing water intake.  [name] has agreed to follow-up with our clinic in 2 weeks. She was informed of the importance of frequent follow-up visits to maximize her success with intensive lifestyle modifications for her multiple health conditions.   Objective:   Blood pressure 105/73, pulse 88, temperature 98 F (36.7 C), temperature source Oral, height 5\' 5"  (1.651 m), weight 246 lb (111.6 kg), SpO2 99 %. Body mass index is 40.94 kg/m.  General: Cooperative, alert, well developed, in no acute distress. HEENT: Conjunctivae and lids unremarkable. Cardiovascular: Regular rhythm.  Lungs: Normal work of breathing. Neurologic: No focal deficits.   Lab Results  Component Value Date   CREATININE 0.76 06/27/2019   BUN 11 06/27/2019   NA 143 06/27/2019   K 4.3 06/27/2019   CL 105 06/27/2019   CO2 24 06/27/2019   Lab Results    Component Value Date   ALT 11 06/27/2019   AST 14 06/27/2019   ALKPHOS 72 06/27/2019   BILITOT <0.2 06/27/2019   Lab Results  Component Value Date   HGBA1C 5.5 06/27/2019   HGBA1C 5.9 (H) 03/27/2019   Lab Results  Component Value Date   INSULIN 7.9 03/27/2019   Lab Results  Component Value Date   TSH 3.620 06/27/2019   Lab Results  Component Value Date   CHOL 187 03/27/2019   HDL 101 03/27/2019   LDLCALC 68 03/27/2019   TRIG 102 03/27/2019   Lab Results  Component Value Date   WBC 5.6 06/27/2019   HGB 13.1 06/27/2019   HCT 40.6 06/27/2019   MCV 87 06/27/2019   PLT 256 06/27/2019   Attestation Statements:   Reviewed by clinician on day of visit: allergies, medications, problem list, medical history, surgical history, family history, social history, and previous encounter notes.  I performed a medically necessary appropriate examination and/or evaluation. I discussed the assessment and treatment plan with the patient. The patient was provided an opportunity to ask questions and all were answered. The patient agreed with the plan and demonstrated an understanding of the instructions. Clinical information was updated  and documented in the EMR. Time spent on visit including pre-visit chart review and post-visit care was 45 minutes.  I, Insurance claims handler, CMA, am acting as Energy manager for [employer], DO.  I have reviewed the above documentation for accuracy and completeness, and I agree with the above. Helane Rima, DO

## 2019-07-14 ENCOUNTER — Other Ambulatory Visit (INDEPENDENT_AMBULATORY_CARE_PROVIDER_SITE_OTHER): Payer: Self-pay | Admitting: Family Medicine

## 2019-07-14 DIAGNOSIS — R7303 Prediabetes: Secondary | ICD-10-CM

## 2019-07-16 ENCOUNTER — Other Ambulatory Visit (HOSPITAL_COMMUNITY): Payer: Self-pay | Admitting: Psychiatry

## 2019-07-16 ENCOUNTER — Encounter (INDEPENDENT_AMBULATORY_CARE_PROVIDER_SITE_OTHER): Payer: Self-pay

## 2019-07-16 DIAGNOSIS — F4001 Agoraphobia with panic disorder: Secondary | ICD-10-CM

## 2019-07-16 DIAGNOSIS — F411 Generalized anxiety disorder: Secondary | ICD-10-CM

## 2019-07-16 DIAGNOSIS — F333 Major depressive disorder, recurrent, severe with psychotic symptoms: Secondary | ICD-10-CM

## 2019-07-19 ENCOUNTER — Encounter: Payer: Self-pay | Admitting: *Deleted

## 2019-07-19 NOTE — Telephone Encounter (Signed)
I'm fine with emgality if patient is ok with it thanks!

## 2019-07-19 NOTE — Telephone Encounter (Addendum)
Per Cover My Meds, Ajovy denied.   Request Reference Number: [id number]. AJOVY INJ 225/1.5 is denied for not meeting the prior authorization requirement(s). Details of this decision are in the notice attached below or have been faxed to you. Appeals are not supported through ePA. Please refer to the fax case notice for appeals information and instructions.  Patient has medicare and is unable to use a savings card. If we should choose to appeal, must do so within 60 days. Standard Fax: [phone]. Insurance requirement to try [employer]

## 2019-07-19 NOTE — Telephone Encounter (Signed)
Sent pt a mychart message to see if she is ok with switching to [employer]

## 2019-07-24 MED ORDER — EMGALITY 120 MG/ML ~~LOC~~ SOAJ: 120.0000 mg | SUBCUTANEOUS | 11 refills | Status: DC

## 2019-07-27 ENCOUNTER — Emergency Department (HOSPITAL_COMMUNITY)
Admission: EM | Admit: 2019-07-27 | Discharge: 2019-07-27 | Disposition: A | Discharge status: Home / Self Care | Payer: Medicare Other | Attending: Emergency Medicine | Admitting: Emergency Medicine

## 2019-07-27 ENCOUNTER — Other Ambulatory Visit: Payer: Self-pay

## 2019-07-27 ENCOUNTER — Encounter (HOSPITAL_COMMUNITY): Payer: Self-pay

## 2019-07-27 DIAGNOSIS — R519 Headache, unspecified: Secondary | ICD-10-CM | POA: Diagnosis not present

## 2019-07-27 DIAGNOSIS — Z79899 Other long term (current) drug therapy: Secondary | ICD-10-CM | POA: Insufficient documentation

## 2019-07-27 DIAGNOSIS — Z9884 Bariatric surgery status: Secondary | ICD-10-CM | POA: Insufficient documentation

## 2019-07-27 DIAGNOSIS — Z7984 Long term (current) use of oral hypoglycemic drugs: Secondary | ICD-10-CM | POA: Diagnosis not present

## 2019-07-27 DIAGNOSIS — I10 Essential (primary) hypertension: Secondary | ICD-10-CM | POA: Insufficient documentation

## 2019-07-27 DIAGNOSIS — J45909 Unspecified asthma, uncomplicated: Secondary | ICD-10-CM | POA: Diagnosis not present

## 2019-07-27 MED ORDER — KETOROLAC TROMETHAMINE 30 MG/ML IJ SOLN
30.0000 mg | Freq: Once | INTRAMUSCULAR | Status: AC
  Administered 2019-07-27: 30 mg via INTRAVENOUS
  Filled 2019-07-27: qty 1

## 2019-07-27 MED ORDER — DIPHENHYDRAMINE HCL 50 MG/ML IJ SOLN
25.0000 mg | Freq: Once | INTRAMUSCULAR | Status: AC
  Administered 2019-07-27: 25 mg via INTRAVENOUS
  Filled 2019-07-27: qty 1

## 2019-07-27 MED ORDER — DEXAMETHASONE SODIUM PHOSPHATE 10 MG/ML IJ SOLN
10.0000 mg | Freq: Once | INTRAMUSCULAR | Status: AC
  Administered 2019-07-27: 10 mg via INTRAVENOUS
  Filled 2019-07-27: qty 1

## 2019-07-27 MED ORDER — METOCLOPRAMIDE HCL 5 MG/ML IJ SOLN
10.0000 mg | Freq: Once | INTRAMUSCULAR | Status: AC
  Administered 2019-07-27: 10 mg via INTRAVENOUS
  Filled 2019-07-27: qty 2

## 2019-07-27 NOTE — ED Triage Notes (Signed)
Patient c/o migraine headache x 4 days. Patient reports sensitivity to light and sound. Patient c/o N/V.

## 2019-07-27 NOTE — ED Provider Notes (Signed)
[facility]-EMERGENCY DEPT Provider Note   CSN: [id number] Arrival date & time: 07/27/19  7322     History Chief Complaint  Patient presents with  . Migraine    [name] is a 54 y.o. female.  The history is provided by the patient and medical records. No language interpreter was used.  Migraine     54 year old female with history of migraine, fibromyalgias, chronic fatigue syndrome, bipolar, presenting complaining of headache.  Patient states she has recurrent migraine headache at least twice a week.  She is here with because of worsening migraine headache for the past 4 days not well controlled with her home medication which includes Percocet, Nurtec and ibuprofen.  Described headache as a sharp throbbing sensation primarily to the left side of her head with light and sound sensitivity along with nausea and nonbloody nonbilious vomiting.  No focal numbness or focal weakness no fever chills no neck stiffness no rash.  She is unsure what brought on her headache.  She mention having Botox injection by her neurologist 2 weeks ago but did not notice any relief.  Neurologist is Dr. Daisy Blossom.  Past Medical History:  Diagnosis Date  . Allergic rhinitis   . Allergic to cats    pet dander  . Anxiety   . Arthritis 09/2018   both feet  . Asthma   . Back pain   . Bipolar disorder (HCC)   . Cervicalgia   . Chronic fatigue syndrome   . Constipation   . Depression   . Dyspnea   . Environmental allergies   . Fibromyalgia   . GERD (gastroesophageal reflux disease)   . Grave's disease   . High cholesterol   . History of blood clots   . Hypertension   . Joint pain   . Lactose intolerance   . Lower extremity edema   . Multiple food allergies   . OSA (obstructive sleep apnea)   . Palpitations   . Panic disorder   . Rheumatoid arthritis (HCC)   . Risk for falls   . Sciatica   . 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  Syncope and collapse   . Thyroid disease   . Vasovagal syncope   . Vertigo   . Vitamin D deficiency     Patient Active Problem List   Diagnosis Date Noted  . Subclinical hypothyroidism 05/11/2019  . Prediabetes 05/11/2019  . Vitamin D deficiency 05/11/2019  . OSA (obstructive sleep apnea) 05/11/2019  . Intractable chronic migraine without aura and with status migrainosus 01/14/2016  . Risk for falls 09/02/2015  . Insomnia 05/27/2015  . 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 Class 3 severe obesity with serious comorbidity and body mass index (BMI) of 40.0 to 44.9 in adult (HCC) 11/14/2014  . Severe 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 (HCC) 10/10/2014  . GAD (generalized anxiety disorder) 10/10/2014  . Panic disorder with agoraphobia 10/10/2014  . Social anxiety disorder 10/10/2014  . Jaw pain-acute TMJ 09/24/14 09/24/2014  . Low back pain 07/24/2014  . Encounter for monitoring opioid maintenance therapy 07/02/2014  . Pharyngoesophageal dysphagia 06/24/2014  . S/P gastric bypass 06/24/2014  . Protein-calorie malnutrition, severe (HCC) 11/03/2012  . Drug overdose, multiple drugs 11/01/2012  . Migraine 05/22/2012  . Depression 11/22/2011  . Chronic pain 11/22/2011  . Graves' disease   . Asthma   . GERD (gastroesophageal reflux disease)   . Need for prophylactic  postmenopausal hormone replacement therapy 11/03/2011  . Allergic rhinitis 09/28/2011  . Anemia 09/28/2011  . Essential hypertension 09/28/2011    Past Surgical History:  Procedure Laterality Date  . ABDOMINAL HYSTERECTOMY  2006  . GASTRIC BYPASS  2008     OB History    Gravida  2   Para  2   Term      Preterm      AB      Living  2     SAB      TAB      Ectopic      Multiple      Live Births              Family History  Problem Relation Age of Onset  . Hypertension Mother   . Cirrhosis Mother   . Alcohol abuse Mother    . Depression Mother   . Physical abuse Mother   . Hyperlipidemia Mother   . Thyroid disease Mother   . Anxiety disorder Mother   . Hypertension Sister   . Alcohol abuse Sister   . Drug abuse Sister   . Depression Sister   . Anxiety disorder Sister   . OCD Sister   . Hypertension Father   . Alcohol abuse Father   . Hyperlipidemia Father   . Depression Father   . Drug abuse Father   . ADD / ADHD Other   . Depression Sister   . Diabetes Other   . Hypertension Other   . Diabetes Maternal Uncle   . Seizures Cousin   . Dementia Neg Hx     Social History   Tobacco Use  . Smoking status: Never Smoker  . Smokeless tobacco: Never Used  Substance Use Topics  . Alcohol use: Yes    Alcohol/week: 1.0 standard drinks    Types: 1 Glasses of wine per week    Comment: one glass of wine every 3 months  . Drug use: No    Home Medications Prior to Admission medications   Medication Sig Start Date End Date Taking? Authorizing Provider  albuterol (VENTOLIN HFA) 108 (90 Base) MCG/ACT inhaler Inhale 2 puffs into the lungs every 6 (six) hours as needed for wheezing. 05/28/19   Helane Rima, DO  BIOTIN 5000 PO Take 5,000 Units by mouth daily.     [provider]  budesonide-formoterol (SYMBICORT) 80-4.5 MCG/ACT inhaler Inhale 2 puffs into the lungs 2 (two) times daily.    [provider]  clonazePAM (KLONOPIN) 0.5 MG tablet Take 1 tablet (0.5 mg total) by mouth 3 (three) times daily as needed. for anxiety 03/29/19   Oletta Darter, MD  diclofenac sodium (VOLTAREN) 1 % GEL Apply 2 g topically 4 (four) times daily. 12/12/18   Vivi Barrack, DPM  divalproex (DEPAKOTE ER) 250 MG 24 hr tablet Take 3 tablets (750 mg total) by mouth daily. 07/11/19   Helane Rima, DO  doxepin (SINEQUAN) 10 MG/ML solution Take 2 mLs (20 mg total) by mouth at bedtime. Take 2 ml (20mg ) po qHS. No early refills 07/12/19   07/14/19, MD  DULoxetine (CYMBALTA) 30 MG capsule Take 1 capsule by  mouth once daily 07/19/19   07/21/19, MD  EPINEPHrine (EPIPEN) 0.3 mg/0.3 mL DEVI Inject 0.3 mg into the muscle as needed. For allergic reaction to stinging insects, mushrooms, and shellfish     [provider]  fluticasone (FLONASE) 50 MCG/ACT nasal spray Place 1 spray into both nostrils as needed (allergies/runny nose).  05/28/19   Briscoe Deutscher, DO  gabapentin (NEURONTIN) 600 MG tablet Take 1 tablet by mouth 4 (four) times daily. 04/27/19   [provider]  Galcanezumab-gnlm (EMGALITY) 120 MG/ML SOAJ Inject 120 mg into the skin every 30 (thirty) days. 07/24/19   Melvenia Beam, MD  hydrochlorothiazide (MICROZIDE) 12.5 MG capsule Take 1 capsule (12.5 mg total) by mouth daily as needed (edema). 07/11/19   Briscoe Deutscher, DO  metFORMIN (GLUCOPHAGE) 500 MG tablet Take 1 tablet by mouth once daily with breakfast 07/17/19   Briscoe Deutscher, DO  MOVANTIK 25 MG TABS tablet  04/25/19   [provider]  Oxycodone HCl 10 MG TABS Take 10 mg by mouth 3 (three) times daily as needed.  03/17/18   [provider]  prazosin (MINIPRESS) 1 MG capsule Take 1 capsule by mouth at bedtime 07/12/19   Charlcie Cradle, MD  Rimegepant Sulfate (NURTEC) 75 MG TBDP Take 75 mg by mouth daily as needed. For migraines. Take as close to onset of migraine as possible. One daily maximum. 07/10/19   Melvenia Beam, MD  Vitamin D, Ergocalciferol, (DRISDOL) 1.25 MG (50000 UNIT) CAPS capsule Take 1 capsule (50,000 Units total) by mouth every 7 (seven) days. 07/11/19   Briscoe Deutscher, DO    Allergies    Bee venom, Coconut fatty acids, Mushroom ext cmplx(shiitake-reishi-mait), Nutritional supplements, Other, Shellfish allergy, Strawberry extract, and Hydrocodone-acetaminophen  Review of Systems   Review of Systems  All other systems reviewed and are negative.   Physical Exam Updated Vital Signs BP 139/80   Pulse 79   Temp 98 F (36.7 C) (Oral)   Resp 16   Ht 5\' 6"  (1.676 m)   Wt 108.9 kg    SpO2 92%   BMI 38.74 kg/m   Physical Exam Vitals and nursing note reviewed.  Constitutional:      General: She is not in acute distress.    Appearance: She is well-developed.     Comments: Patient is laying in a dark room but well-appearing  HENT:     Head: Atraumatic.  Eyes:     Extraocular Movements: Extraocular movements intact.     Conjunctiva/sclera: Conjunctivae normal.     Pupils: Pupils are equal, round, and reactive to light.  Cardiovascular:     Rate and Rhythm: Normal rate and regular rhythm.     Pulses: Normal pulses.     Heart sounds: Normal heart sounds.  Musculoskeletal:     Cervical back: Neck supple. No rigidity.  Skin:    Findings: No rash.  Neurological:     Mental Status: She is alert and oriented to person, place, and time.     GCS: GCS eye subscore is 4. GCS verbal subscore is 5. GCS motor subscore is 6.     Cranial Nerves: Cranial nerves are intact.     Sensory: Sensation is intact.     Motor: Motor function is intact.     Comments: 5 out of 5 strength to all 4 extremities.  Gait not tested.     ED Results / Procedures / Treatments   Labs (all labs ordered are listed, but only abnormal results are displayed) Labs Reviewed - No data to display  EKG None  Radiology No results found.  Procedures Procedures (including critical care time)  Medications Ordered in ED Medications  ketorolac (TORADOL) 30 MG/ML injection 30 mg (30 mg Intravenous Given 07/27/19 0833)  dexamethasone (DECADRON) injection 10 mg (10 mg Intravenous Given 07/27/19 0834)  metoCLOPramide (  REGLAN) injection 10 mg (10 mg Intravenous Given 07/27/19 0834)  diphenhydrAMINE (BENADRYL) injection 25 mg (25 mg Intravenous Given 07/27/19 0834)    ED Course  I have reviewed the triage vital signs and the nursing notes.  Pertinent labs & imaging results that were available during my care of the patient were reviewed by me and considered in my medical decision making (see chart for  details).    MDM Rules/Calculators/A&P                      BP 128/84   Pulse 79   Temp 98 F (36.7 C) (Oral)   Resp 16   Ht 5\' 6"  (1.676 m)   Wt 108.9 kg   SpO2 100%   BMI 38.74 kg/m   Final Clinical Impression(s) / ED Diagnoses Final diagnoses:  Bad headache    Rx / DC Orders ED Discharge Orders    None     8:23 AM Patient with history of migraine here with progressive worsening headache similar to migraine.  She does not have any red flags.  Low suspicion for meningitis, subarachnoid hemorrhage, stroke, or other concerning feature.  Will provide symptomatic treatment.  11:28 AM Patient report improvement of her headache.  She is stable for discharge.   , PA-C 07/27/19 1129    09/26/19, MD 07/31/19 [phone]

## 2019-07-29 ENCOUNTER — Telehealth: Payer: Self-pay | Admitting: Neurology

## 2019-07-29 NOTE — Telephone Encounter (Signed)
She called Saturday night that she was still having a bad headache.  She went to the ED on Friday for a 'cocktail' which only helped a few hours,   Already on multiple medications,  I advised her I would forward her message to Dr. Lucia Gaskins andn she coulf go to the ED or UC if worsens

## 2019-07-29 NOTE — Telephone Encounter (Signed)
Give her a call please Toma Copier, thanks

## 2019-07-30 ENCOUNTER — Encounter (INDEPENDENT_AMBULATORY_CARE_PROVIDER_SITE_OTHER): Payer: Self-pay | Admitting: Family Medicine

## 2019-07-30 ENCOUNTER — Ambulatory Visit (INDEPENDENT_AMBULATORY_CARE_PROVIDER_SITE_OTHER): Payer: Medicare Other | Admitting: Family Medicine

## 2019-07-30 ENCOUNTER — Other Ambulatory Visit: Payer: Self-pay

## 2019-07-30 VITALS — BP 105/74 | HR 74 | Temp 98.0°F | Ht 65.0 in | Wt 245.0 lb

## 2019-07-30 DIAGNOSIS — G43809 Other migraine, not intractable, without status migrainosus: Secondary | ICD-10-CM

## 2019-07-30 DIAGNOSIS — R7303 Prediabetes: Secondary | ICD-10-CM

## 2019-07-30 DIAGNOSIS — M797 Fibromyalgia: Secondary | ICD-10-CM

## 2019-07-30 DIAGNOSIS — F3289 Other specified depressive episodes: Secondary | ICD-10-CM

## 2019-07-30 DIAGNOSIS — Z6841 Body Mass Index (BMI) 40.0 and over, adult: Secondary | ICD-10-CM

## 2019-07-30 DIAGNOSIS — Z9884 Bariatric surgery status: Secondary | ICD-10-CM | POA: Diagnosis not present

## 2019-07-30 MED ORDER — PREDNISONE 5 MG PO TABS: ORAL_TABLET | ORAL | 0 refills | Status: DC

## 2019-07-30 MED ORDER — ONDANSETRON 4 MG PO TBDP: 4.0000 mg | ORAL_TABLET | Freq: Three times a day (TID) | ORAL | 0 refills | Status: DC | PRN

## 2019-07-30 MED ORDER — KETOROLAC TROMETHAMINE 10 MG PO TABS: 10.0000 mg | ORAL_TABLET | Freq: Four times a day (QID) | ORAL | 0 refills | Status: DC | PRN

## 2019-07-30 NOTE — Telephone Encounter (Signed)
Spoke with pt. She said she has had 3-4 headaches per day since she had her botox done. She said she feels it may be a sinus infection. Her ears started hurting. She said she has pain on the L side. Nurtec doesn't help. She saw PCP today and was given Toradol, Prednisone taper, and Zofran. Pt stated she would try this for a week. I asked her to keep in touch but also to follow up with PCP if her congestion does not improve. Advised pt if she were to have any worsening she would need to go to the ED. If she develops any new symptoms such as numbness, weakness, speech problems, etc to call 911. Pt verbalized understanding and appreciation.

## 2019-07-31 NOTE — Progress Notes (Signed)
Chief Complaint:   OBESITY [name] is here to discuss her progress with her obesity treatment plan along with follow-up of her obesity related diagnoses. [name] is on keeping a food journal and adhering to recommended goals of 1100-1300 calories and 75 grams of protein and states she is following her eating plan approximately 30% of the time. [name] states she is exercising for 0 minutes 0 times per week.  Today's visit was #: 9 Starting weight: 250 lbs Starting date: 03/27/2019 Today's weight: 245 lbs Today's date: 07/30/2019 Total lbs lost to date: 5 lbs Total lbs lost since last in-office visit: 1 lb  Interim History: [name] has a severe migraine today.  She says she plans to go to the ED after her visit for migraine treatment.  She says the pain has been severe for 2 weeks.  She is followed by Neurology.  She is taking Ajovy.  Subjective:   1. Prediabetes [name] has a diagnosis of prediabetes based on her elevated HgA1c and was informed this puts her at greater risk of developing diabetes. She continues to work on diet and exercise to decrease her risk of diabetes. She denies nausea or hypoglycemia.  Lab Results  Component Value Date   HGBA1C 5.5 06/27/2019   Lab Results  Component Value Date   INSULIN 7.9 03/27/2019   2. Fibromyalgia [name] has the diagnosis of fibromyalgia.  3. S/P gastric bypass [name] has a history of gastric bypass surgery.  4. Other migraine without status migrainosus, not intractable After her appointment, the patient called with a request for medication for acute migraine.  5. Other depression, with emotional eating [name] is struggling with emotional eating and using food for comfort to the extent that it is negatively impacting her health. She has been working on behavior modification techniques to help reduce her emotional eating and has been unsuccessful. She shows no sign of suicidal or homicidal ideations.  Assessment/Plan:   1.  Prediabetes [name] will continue to work on weight loss, exercise, and decreasing simple carbohydrates to help decrease the risk of diabetes.   2. Fibromyalgia Intensive lifestyle modifications are the first line treatment for this issue. We discussed several lifestyle modifications today and she will continue to work on diet, exercise and weight loss efforts.We will continue to monitor. Orders and follow up as documented in patient record.   Counseling . Try DeathPrevention.it, which is a series of self-care modules designed to teach patients several techniques to manage pain.   3. S/P gastric bypass [name] is at risk for malnutrition due to her previous bariatric surgery.   Counseling  You may need to eat 3 meals and 2 snacks, or 5 small meals each day in order to reach your protein and calorie goals.   Allow at least 15 minutes for each meal so that you can eat mindfully. Listen to your body so that you do not overeat. For most people, your sleeve or pouch will comfortably hold 4-6 ounces.  Eat foods from all food groups. This includes fruits and vegetables, grains, dairy, and meat and other proteins.  Include a protein-rich food at every meal and snack, and eat the protein food first.   You should be taking a Bariatric Multivitamin as well as calcium.   4. Other migraine without status migrainosus Patient originally wanted to go to the ED for migraine treatment. Based on history, okay Rx prednisone pack along with Zofran, and Toradol as needed.  She is to call her Neurologist today  as well. She will need to protect her stomach with Pepcid/Omeprazole and use the Toradol VERY sparingly. ED precautions reviewed.  5. Other depression, with emotional eating Behavior modification techniques were discussed today to help [name] deal with her emotional/non-hunger eating behaviors.  Orders and follow up as documented in patient record.   6. Class 3 severe obesity with  serious comorbidity and body mass index (BMI) of 40.0 to 44.9 in adult, unspecified obesity type [facility]) [name] is currently in the action stage of change. As such, her goal is to continue with weight loss efforts. She has agreed to keeping a food journal and adhering to recommended goals of 1100-1300 calories and 75 grams of protein.   Exercise goals: No exercise has been prescribed at this time.  Behavioral modification strategies: increasing lean protein intake and increasing water intake.  [name] has agreed to follow-up with our clinic in 2 weeks. She was informed of the importance of frequent follow-up visits to maximize her success with intensive lifestyle modifications for her multiple health conditions.   Objective:   Blood pressure 105/74, pulse 74, temperature 98 F (36.7 C), temperature source Oral, height 5\' 5"  (1.651 m), weight 245 lb (111.1 kg), SpO2 97 %. Body mass index is 40.77 kg/m.  General: Cooperative, alert, well developed, in no acute distress. HEENT: Conjunctivae and lids unremarkable. Cardiovascular: Regular rhythm.  Lungs: Normal work of breathing. Neurologic: No focal deficits.   Lab Results  Component Value Date   CREATININE 0.76 06/27/2019   BUN 11 06/27/2019   NA 143 06/27/2019   K 4.3 06/27/2019   CL 105 06/27/2019   CO2 24 06/27/2019   Lab Results  Component Value Date   ALT 11 06/27/2019   AST 14 06/27/2019   ALKPHOS 72 06/27/2019   BILITOT <0.2 06/27/2019   Lab Results  Component Value Date   HGBA1C 5.5 06/27/2019   HGBA1C 5.9 (H) 03/27/2019   Lab Results  Component Value Date   INSULIN 7.9 03/27/2019   Lab Results  Component Value Date   TSH 3.620 06/27/2019   Lab Results  Component Value Date   CHOL 187 03/27/2019   HDL 101 03/27/2019   LDLCALC 68 03/27/2019   TRIG 102 03/27/2019   Lab Results  Component Value Date   WBC 5.6 06/27/2019   HGB 13.1 06/27/2019   HCT 40.6 06/27/2019   MCV 87 06/27/2019   PLT 256  06/27/2019   Attestation Statements:   Reviewed by clinician on day of visit: allergies, medications, problem list, medical history, surgical history, family history, social history, and previous encounter notes.  I performed a medically necessary appropriate examination and/or evaluation. I discussed the assessment and treatment plan with the patient. The patient was provided an opportunity to ask questions and all were answered. The patient agreed with the plan and demonstrated an understanding of the instructions. Clinical information was updated and documented in the EMR. Time spent on visit including pre-visit chart review and post-visit care was 45 minutes.  I, Water quality scientist, CMA, am acting as Location manager for [employer], DO.  I have reviewed the above documentation for accuracy and completeness, and I agree with the above. Briscoe Deutscher, DO

## 2019-07-31 NOTE — Telephone Encounter (Signed)
Spoke with Dr. Lucia Gaskins who advised to have pt finish her Ajovy samples and then start the [facility] after.

## 2019-08-02 ENCOUNTER — Encounter (INDEPENDENT_AMBULATORY_CARE_PROVIDER_SITE_OTHER): Payer: Self-pay

## 2019-08-05 ENCOUNTER — Other Ambulatory Visit (HOSPITAL_COMMUNITY): Payer: Self-pay | Admitting: Psychiatry

## 2019-08-05 DIAGNOSIS — F411 Generalized anxiety disorder: Secondary | ICD-10-CM

## 2019-08-05 DIAGNOSIS — F4001 Agoraphobia with panic disorder: Secondary | ICD-10-CM

## 2019-08-05 DIAGNOSIS — F333 Major depressive disorder, recurrent, severe with psychotic symptoms: Secondary | ICD-10-CM

## 2019-08-06 ENCOUNTER — Telehealth: Payer: Self-pay | Admitting: *Deleted

## 2019-08-06 NOTE — Telephone Encounter (Signed)
Completed Nurtec PA on CMM. Key: [id number]. Awaiting determination from [facility].

## 2019-08-07 ENCOUNTER — Other Ambulatory Visit (INDEPENDENT_AMBULATORY_CARE_PROVIDER_SITE_OTHER): Payer: Self-pay | Admitting: Family Medicine

## 2019-08-07 DIAGNOSIS — G43809 Other migraine, not intractable, without status migrainosus: Secondary | ICD-10-CM

## 2019-08-07 DIAGNOSIS — R609 Edema, unspecified: Secondary | ICD-10-CM

## 2019-08-08 ENCOUNTER — Telehealth: Payer: Self-pay | Admitting: Gastroenterology

## 2019-08-08 NOTE — Telephone Encounter (Signed)
Hi Dr. Barron Alvine, we have received a referral from patient's PCP for a repeat colon. Patient had colon in 2016. Records have been received and they will be sent to you for review. Please advise on scheduling. Thank you.

## 2019-08-09 ENCOUNTER — Telehealth (HOSPITAL_COMMUNITY): Payer: Self-pay | Admitting: *Deleted

## 2019-08-09 NOTE — Telephone Encounter (Signed)
Pt called asking for refills on the Klonopin, Doxepin, Cymbalta, and Minipress. Pt has an upcoming appointment 08/16/19. Please review and advise.

## 2019-08-09 NOTE — Telephone Encounter (Signed)
Per Cover My Meds, NURTEC TAB 75MG  ODT, use as directed, is approved for non-formulary exception through 05/23/2020 under your Medicare Part D benefit.  I faxed the notice to pt's pharmacy. Received a receipt of confirmation.

## 2019-08-10 ENCOUNTER — Other Ambulatory Visit (HOSPITAL_COMMUNITY): Payer: Self-pay | Admitting: Psychiatry

## 2019-08-10 DIAGNOSIS — F4001 Agoraphobia with panic disorder: Secondary | ICD-10-CM

## 2019-08-10 DIAGNOSIS — G4700 Insomnia, unspecified: Secondary | ICD-10-CM

## 2019-08-10 DIAGNOSIS — F333 Major depressive disorder, recurrent, severe with psychotic symptoms: Secondary | ICD-10-CM

## 2019-08-10 DIAGNOSIS — F411 Generalized anxiety disorder: Secondary | ICD-10-CM

## 2019-08-13 ENCOUNTER — Telehealth (HOSPITAL_COMMUNITY): Payer: Self-pay | Admitting: *Deleted

## 2019-08-13 ENCOUNTER — Encounter: Payer: Self-pay | Admitting: *Deleted

## 2019-08-13 ENCOUNTER — Other Ambulatory Visit (INDEPENDENT_AMBULATORY_CARE_PROVIDER_SITE_OTHER): Payer: Self-pay | Admitting: Family Medicine

## 2019-08-13 ENCOUNTER — Telehealth: Payer: Self-pay | Admitting: *Deleted

## 2019-08-13 DIAGNOSIS — R609 Edema, unspecified: Secondary | ICD-10-CM

## 2019-08-13 DIAGNOSIS — G43809 Other migraine, not intractable, without status migrainosus: Secondary | ICD-10-CM

## 2019-08-13 NOTE — Telephone Encounter (Signed)
Emgality PA approved on Cover My Meds. Request Reference Number: [id number]. EMGALITY INJ 120MG /ML is approved through 05/23/2020. Your patient may now fill this prescription and it will be covered. Key: [id number].

## 2019-08-13 NOTE — Telephone Encounter (Signed)
Writer received another t/c from pt requesting refills on her psych meds prescribed by [facility] OP:  Cymbalta, Klonopin, Doxepin, Minipress. Please review. Pt is out of medication she says. She has an appointment on 08/16/19.

## 2019-08-14 ENCOUNTER — Telehealth (HOSPITAL_COMMUNITY): Payer: Self-pay | Admitting: *Deleted

## 2019-08-14 NOTE — Telephone Encounter (Signed)
Prazosin-  I will send in order

## 2019-08-14 NOTE — Telephone Encounter (Signed)
Pt has called again today regarding refills. Writer and pt had a three way call with [facility] pharmacy to verify medications. According to pharmacy Doxepin rx never received on 2/18, neither was Klonopin showing sent in 03/29/19. They did receive Cymbalta and Minipress which will both need refills. Please review pt current medications. [facility] [address] May be reached @ [phone]. Please review and advise. Pt has an upcoming appointment with you on Thursday 08/16/19.

## 2019-08-14 NOTE — Telephone Encounter (Signed)
Pt encouraged to call pharmacy for refills with the exception of the Minimpress with has no refills. Pt continues to maintain pharmacy telling her no refills. [facility] [address] [facility] to verify.

## 2019-08-15 ENCOUNTER — Ambulatory Visit (INDEPENDENT_AMBULATORY_CARE_PROVIDER_SITE_OTHER): Payer: Medicare Other | Admitting: Family Medicine

## 2019-08-15 ENCOUNTER — Other Ambulatory Visit: Payer: Self-pay

## 2019-08-15 ENCOUNTER — Encounter (INDEPENDENT_AMBULATORY_CARE_PROVIDER_SITE_OTHER): Payer: Self-pay | Admitting: Family Medicine

## 2019-08-15 VITALS — BP 105/72 | HR 80 | Temp 98.6°F | Ht 65.0 in | Wt 242.0 lb

## 2019-08-15 DIAGNOSIS — Z6841 Body Mass Index (BMI) 40.0 and over, adult: Secondary | ICD-10-CM | POA: Diagnosis not present

## 2019-08-15 DIAGNOSIS — F419 Anxiety disorder, unspecified: Secondary | ICD-10-CM

## 2019-08-15 DIAGNOSIS — R7303 Prediabetes: Secondary | ICD-10-CM | POA: Diagnosis not present

## 2019-08-15 DIAGNOSIS — Z9884 Bariatric surgery status: Secondary | ICD-10-CM | POA: Diagnosis not present

## 2019-08-15 MED ORDER — CLONAZEPAM 0.5 MG PO TABS: 0.5000 mg | ORAL_TABLET | Freq: Three times a day (TID) | ORAL | 0 refills | Status: DC | PRN

## 2019-08-15 NOTE — Progress Notes (Signed)
Chief Complaint:   OBESITY [name] is here to discuss her progress with her obesity treatment plan along with follow-up of her obesity related diagnoses. [name] is on keeping a food journal and adhering to recommended goals of 1100-1300 calories and 75 grams of protein and states she is following her eating plan approximately 0% of the time. [name] states she is walking for 30 minutes 3 times per week.  Today's visit was #: 10 Starting weight: 250 lbs Starting date: 03/27/2019 Today's weight: 242 lbs Today's date: 08/15/2019 Total lbs lost to date: 8 lbs Total lbs lost since last in-office visit: 3 lbs  Interim History: [name] says her headache improved somewhat with a prednisone taper.  Unfortunately, she has been out of several psychiatric medications.  She reports that she is unable to sleep.  Her anxiety has worsened significantly.  She has a follow-up with her psychiatrist tomorrow.  Subjective:   1. Prediabetes [name] has a diagnosis of prediabetes based on her elevated HgA1c and was informed this puts her at greater risk of developing diabetes. She continues to work on diet and exercise to decrease her risk of diabetes. She denies nausea or hypoglycemia.  Lab Results  Component Value Date   HGBA1C 5.5 06/27/2019   Lab Results  Component Value Date   INSULIN 7.9 03/27/2019   2. S/P gastric bypass [name] has history of gastric bypass surgery in 2008.  3. Anxiety [name] has been out of her medications for 1 week.  She endorses panic attacks and has been unable to sleep.  Assessment/Plan:   1. Prediabetes [name] will continue to work on weight loss, exercise, and decreasing simple carbohydrates to help decrease the risk of diabetes.   2. S/P gastric bypass [name] is at risk for malnutrition due to her previous bariatric surgery.   Counseling  You may need to eat 3 meals and 2 snacks, or 5 small meals each day in order to reach your protein and calorie  goals.   Allow at least 15 minutes for each meal so that you can eat mindfully. Listen to your body so that you do not overeat. For most people, your sleeve or pouch will comfortably hold 4-6 ounces.  Eat foods from all food groups. This includes fruits and vegetables, grains, dairy, and meat and other proteins.  Include a protein-rich food at every meal and snack, and eat the protein food first.   You should be taking a Bariatric Multivitamin as well as calcium.   3. Anxiety Behavior modification techniques were discussed today to help [name] deal with her anxiety.  Curtesy refill today to get her to appointment tomorrow. Orders and follow up as documented in patient record.   Orders - clonazePAM (KLONOPIN) 0.5 MG tablet; Take 1 tablet (0.5 mg total) by mouth 3 (three) times daily as needed for up to 2 days.  Dispense: 6 tablet; Refill: 0  4. Class 3 severe obesity with serious comorbidity and body mass index (BMI) of 40.0 to 44.9 in adult, unspecified obesity type [facility]) [name] is currently in the action stage of change. As such, her goal is to continue with weight loss efforts. She has agreed to keeping a food journal and adhering to recommended goals of 1500 calories and 75 grams of protein.   Exercise goals: No exercise has been prescribed at this time.  Behavioral modification strategies: increasing lean protein intake and increasing water intake.  [name] has agreed to follow-up with our clinic in 2 weeks. She  was informed of the importance of frequent follow-up visits to maximize her success with intensive lifestyle modifications for her multiple health conditions.   Objective:   Blood pressure 105/72, pulse 80, temperature 98.6 F (37 C), temperature source Oral, height 5\' 5"  (1.651 m), weight 242 lb (109.8 kg), SpO2 99 %. Body mass index is 40.27 kg/m.  General: Cooperative, alert, well developed, in no acute distress. HEENT: Conjunctivae and lids  unremarkable. Cardiovascular: Regular rhythm.  Lungs: Normal work of breathing. Neurologic: No focal deficits.   Lab Results  Component Value Date   CREATININE 0.76 06/27/2019   BUN 11 06/27/2019   NA 143 06/27/2019   K 4.3 06/27/2019   CL 105 06/27/2019   CO2 24 06/27/2019   Lab Results  Component Value Date   ALT 11 06/27/2019   AST 14 06/27/2019   ALKPHOS 72 06/27/2019   BILITOT <0.2 06/27/2019   Lab Results  Component Value Date   HGBA1C 5.5 06/27/2019   HGBA1C 5.9 (H) 03/27/2019   Lab Results  Component Value Date   INSULIN 7.9 03/27/2019   Lab Results  Component Value Date   TSH 3.620 06/27/2019   Lab Results  Component Value Date   CHOL 187 03/27/2019   HDL 101 03/27/2019   LDLCALC 68 03/27/2019   TRIG 102 03/27/2019   Lab Results  Component Value Date   WBC 5.6 06/27/2019   HGB 13.1 06/27/2019   HCT 40.6 06/27/2019   MCV 87 06/27/2019   PLT 256 06/27/2019   Obesity Behavioral Intervention:   Approximately 15 minutes were spent on the discussion below.  ASK: We discussed the diagnosis of obesity with [name] today and [name] agreed to give [name] permission to discuss obesity behavioral modification therapy today.  ASSESS: [name] has the diagnosis of obesity and her BMI today is 40.4. [name] is in the action stage of change.   ADVISE: [name] was educated on the multiple health risks of obesity as well as the benefit of weight loss to improve her health. She was advised of the need for long term treatment and the importance of lifestyle modifications to improve her current health and to decrease her risk of future health problems.  AGREE: Multiple dietary modification options and treatment options were discussed and [name] agreed to follow the recommendations documented in the above note.  ARRANGE: [name] was educated on the importance of frequent visits to treat obesity as outlined per CMS and USPSTF guidelines and agreed to schedule her next  follow up appointment today.  Attestation Statements:   Reviewed by clinician on day of visit: allergies, medications, problem list, medical history, surgical history, family history, social history, and previous encounter notes.  I, Water quality scientist, CMA, am acting as Location manager for [employer], DO.  I have reviewed the above documentation for accuracy and completeness, and I agree with the above. Briscoe Deutscher, DO

## 2019-08-16 ENCOUNTER — Encounter (HOSPITAL_COMMUNITY): Payer: Self-pay | Admitting: Psychiatry

## 2019-08-16 ENCOUNTER — Other Ambulatory Visit (INDEPENDENT_AMBULATORY_CARE_PROVIDER_SITE_OTHER): Payer: Self-pay | Admitting: *Deleted

## 2019-08-16 ENCOUNTER — Ambulatory Visit (INDEPENDENT_AMBULATORY_CARE_PROVIDER_SITE_OTHER): Payer: Medicare Other | Admitting: Psychiatry

## 2019-08-16 DIAGNOSIS — F411 Generalized anxiety disorder: Secondary | ICD-10-CM | POA: Diagnosis not present

## 2019-08-16 DIAGNOSIS — F419 Anxiety disorder, unspecified: Secondary | ICD-10-CM

## 2019-08-16 DIAGNOSIS — F4001 Agoraphobia with panic disorder: Secondary | ICD-10-CM

## 2019-08-16 DIAGNOSIS — F333 Major depressive disorder, recurrent, severe with psychotic symptoms: Secondary | ICD-10-CM

## 2019-08-16 DIAGNOSIS — F3181 Bipolar II disorder: Secondary | ICD-10-CM

## 2019-08-16 DIAGNOSIS — F4010 Social phobia, unspecified: Secondary | ICD-10-CM

## 2019-08-16 DIAGNOSIS — E559 Vitamin D deficiency, unspecified: Secondary | ICD-10-CM

## 2019-08-16 DIAGNOSIS — R7303 Prediabetes: Secondary | ICD-10-CM

## 2019-08-16 DIAGNOSIS — G4700 Insomnia, unspecified: Secondary | ICD-10-CM

## 2019-08-16 MED ORDER — DIVALPROEX SODIUM ER 250 MG PO TB24: 750.0000 mg | ORAL_TABLET | Freq: Every day | ORAL | 2 refills | Status: DC

## 2019-08-16 MED ORDER — HYDROCHLOROTHIAZIDE 12.5 MG PO CAPS: 12.5000 mg | ORAL_CAPSULE | Freq: Every day | ORAL | 0 refills | Status: DC | PRN

## 2019-08-16 MED ORDER — PRAZOSIN HCL 1 MG PO CAPS: 1.0000 mg | ORAL_CAPSULE | Freq: Every day | ORAL | 2 refills | Status: DC

## 2019-08-16 MED ORDER — DULOXETINE HCL 30 MG PO CPEP: 30.0000 mg | ORAL_CAPSULE | Freq: Every day | ORAL | 0 refills | Status: DC

## 2019-08-16 MED ORDER — METFORMIN HCL 500 MG PO TABS: ORAL_TABLET | ORAL | 0 refills | Status: DC

## 2019-08-16 MED ORDER — DOXEPIN HCL 10 MG/ML PO CONC: 20.0000 mg | Freq: Every day | ORAL | 2 refills | Status: DC

## 2019-08-16 MED ORDER — CLONAZEPAM 0.5 MG PO TABS: 0.5000 mg | ORAL_TABLET | Freq: Three times a day (TID) | ORAL | 2 refills | Status: DC | PRN

## 2019-08-16 NOTE — Progress Notes (Signed)
Virtual Visit via Telephone Note  I connected with [name] on 08/16/19 at  1:00 PM EDT by telephone and verified that I am speaking with the correct person using two identifiers.  Location: Patient: home Provider: office   I discussed the limitations, risks, security and privacy concerns of performing an evaluation and management service by telephone and the availability of in person appointments. I also discussed with the patient that there may be a patient responsible charge related to this service. The patient expressed understanding and agreed to proceed.   History of Present Illness: [name] states she is irritated and frustrated. She was not able to get her Doxepin filled in Feb.  [facility] said they did not receive the scripts. She called the office and did a conference call with the nurse and [facility]. Her sleep is poor. [name] ran out of Prazosin 3 weeks ago. She is not able to fall asleep or stay asleep. She is getting about 4 hrs or less a night. [name] is exhausted and spends a lot of time in bed during the night. She has no energy, no motivation. She is depressed, irritable and snapping at people. She has poor appetite and she might eat 2-3 protein shakes a day. Her depression is 10/10 (10 being the worst) at 3 days a week. Other days it is 7/10. She denies SI/HI. [name] had 2 bad panic attacks on weekend. It was due to stress. She called the clinic and was routed to the hospital. Yesterday she went to her PCP who gave her 2 tabs of Klonopin. She had been out for 6 days.   Observations/Objective:  General Appearance: unable to assess  Eye Contact:  unable to assess  Speech:  Clear and Coherent and Normal Rate  Volume:  Normal  Mood:  Anxious and Depressed  Affect:  Congruent  Thought Process:  Coherent and Descriptions of Associations: Intact  Orientation:  Full (Time, Place, and Person)  Thought Content:  Rumination  Suicidal Thoughts:  No  Homicidal Thoughts:  No   Memory:  Immediate;   Good  Judgement:  Good  Insight:  Good  Psychomotor Activity: unable to assess  Concentration:  Concentration: Good  Recall:  Good  Fund of Knowledge:  Good  Language:  Good  Akathisia:  unable to assess  Handed:  Right  AIMS (if indicated):     Assets:  Communication Skills Desire for Improvement Financial Resources/Insurance Housing Social Support Talents/Skills Transportation Vocational/Educational  ADL's:  unable to assess  Cognition:  WNL  Sleep:          I reviewed the information below on 08/16/19 and have updated it Assessment and Plan: MDD- recurrent, 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 The patient was provided an opportunity to ask questions and all were answered. The patient agreed with the plan and demonstrated an understanding of the instructions.   The patient was advised to call back or seek an in-person evaluation if the symptoms worsen or if the condition fails to  improve as anticipated.  I provided 30 minutes of non-face-to-face time during this encounter.   Charlcie Cradle, MD

## 2019-08-21 ENCOUNTER — Encounter: Payer: Self-pay | Admitting: Gastroenterology

## 2019-08-21 NOTE — Telephone Encounter (Signed)
Patient has been scheduled

## 2019-08-21 NOTE — Telephone Encounter (Signed)
LMOM for patient to call back.

## 2019-08-21 NOTE — Telephone Encounter (Signed)
Records received and reviewed today and notable for the following:  -Colonoscopy (08/08/2014, [facility]): Fair prep, otherwise normal colonoscopy. Recommended repeat in 5 years.  Given suboptimal quality of bowel prep as reported on previous colonoscopy in 2016, agree with plan for short interval repeat screening colonoscopy at this time. Okay to schedule direct with me for colonoscopy in [facility], next available.

## 2019-08-23 ENCOUNTER — Ambulatory Visit (HOSPITAL_COMMUNITY): Payer: Medicare Other | Admitting: Licensed Clinical Social Worker

## 2019-08-29 ENCOUNTER — Ambulatory Visit (INDEPENDENT_AMBULATORY_CARE_PROVIDER_SITE_OTHER): Payer: Medicare Other | Admitting: Family Medicine

## 2019-08-30 ENCOUNTER — Ambulatory Visit (AMBULATORY_SURGERY_CENTER): Payer: Self-pay | Admitting: *Deleted

## 2019-08-30 ENCOUNTER — Other Ambulatory Visit: Payer: Self-pay

## 2019-08-30 ENCOUNTER — Encounter (INDEPENDENT_AMBULATORY_CARE_PROVIDER_SITE_OTHER): Payer: Self-pay | Admitting: Family Medicine

## 2019-08-30 VITALS — Temp 96.8°F | Ht 65.0 in | Wt 253.6 lb

## 2019-08-30 DIAGNOSIS — Z01818 Encounter for other preprocedural examination: Secondary | ICD-10-CM

## 2019-08-30 DIAGNOSIS — Z1211 Encounter for screening for malignant neoplasm of colon: Secondary | ICD-10-CM

## 2019-08-30 MED ORDER — SUPREP BOWEL PREP KIT 17.5-3.13-1.6 GM/177ML PO SOLN: ORAL | 0 refills | Status: DC

## 2019-08-30 NOTE — Progress Notes (Signed)
covid test 09-05-19 at 1:30  Pt is aware that care partner will wait in the car during procedure; if they feel like they will be too hot or cold to wait in the car; they may wait in the 4 th floor lobby. Patient is aware to bring only one care partner. We want them to wear a mask (we do not have any that we can provide them), practice social distancing, and we will check their temperatures when they get here.  I did remind the patient that their care partner needs to stay in the parking lot the entire time and have a cell phone available, we will call them when the pt is ready for discharge. Patient will wear mask into building.   No egg or soy allergy  No home oxygen use   No medications for weight loss taken   No trouble with anesthesia, difficulty with intubation or hx/fam hx of malignant hyperthermia per pt

## 2019-09-05 ENCOUNTER — Other Ambulatory Visit: Payer: Self-pay

## 2019-09-05 ENCOUNTER — Ambulatory Visit (INDEPENDENT_AMBULATORY_CARE_PROVIDER_SITE_OTHER): Payer: Medicare Other

## 2019-09-05 DIAGNOSIS — Z1159 Encounter for screening for other viral diseases: Secondary | ICD-10-CM

## 2019-09-06 LAB — SARS CORONAVIRUS 2 (TAT 6-24 HRS): SARS Coronavirus 2: NEGATIVE

## 2019-09-07 ENCOUNTER — Other Ambulatory Visit (HOSPITAL_COMMUNITY): Payer: Self-pay | Admitting: Psychiatry

## 2019-09-07 DIAGNOSIS — F4001 Agoraphobia with panic disorder: Secondary | ICD-10-CM

## 2019-09-07 DIAGNOSIS — G4700 Insomnia, unspecified: Secondary | ICD-10-CM

## 2019-09-07 DIAGNOSIS — F3181 Bipolar II disorder: Secondary | ICD-10-CM

## 2019-09-07 DIAGNOSIS — F411 Generalized anxiety disorder: Secondary | ICD-10-CM

## 2019-09-07 DIAGNOSIS — F333 Major depressive disorder, recurrent, severe with psychotic symptoms: Secondary | ICD-10-CM

## 2019-09-10 ENCOUNTER — Encounter: Payer: Self-pay | Admitting: Gastroenterology

## 2019-09-10 ENCOUNTER — Other Ambulatory Visit: Payer: Self-pay

## 2019-09-10 ENCOUNTER — Ambulatory Visit (AMBULATORY_SURGERY_CENTER): Payer: Medicare Other | Admitting: Gastroenterology

## 2019-09-10 VITALS — BP 134/83 | HR 73 | Temp 96.8°F | Resp 14 | Ht 65.0 in | Wt 253.6 lb

## 2019-09-10 DIAGNOSIS — Z1211 Encounter for screening for malignant neoplasm of colon: Secondary | ICD-10-CM

## 2019-09-10 DIAGNOSIS — K640 First degree hemorrhoids: Secondary | ICD-10-CM

## 2019-09-10 MED ORDER — SODIUM CHLORIDE 0.9 % IV SOLN: 500.0000 mL | Freq: Once | INTRAVENOUS | Status: DC

## 2019-09-10 NOTE — Progress Notes (Signed)
Report to PACU, RN, vss, BBS= Clear.  

## 2019-09-10 NOTE — Progress Notes (Signed)
Pt's states no medical or surgical changes since previsit or office visit.  Temp JB VS CW 

## 2019-09-10 NOTE — Op Note (Signed)
[facility] Patient Name: [name] Procedure Date: 09/10/2019 2:50 PM MRN: [id number] Endoscopist: Doristine Locks , MD Age: 54 Referring MD:  Date of Birth: Apr 21, 1966 Gender: Female Account #: [id number] Procedure:                Colonoscopy Indications:              Screening for colorectal malignant neoplasm,                            inadequate bowel prep on last colonoscopy (more                            recent than 10 years ago)                           Last colonoscopy was at outside facility in 07/2014                            and notable for suboptimal bowel preparation, but                            otherwise normal, with recommendation to repeat in                            5 years. No known FHx of CRC. Medicines:                Monitored Anesthesia Care Procedure:                Pre-Anesthesia Assessment:                           - Prior to the procedure, a History and Physical                            was performed, and patient medications and                            allergies were reviewed. The patient's tolerance of                            previous anesthesia was also reviewed. The risks                            and benefits of the procedure and the sedation                            options and risks were discussed with the patient.                            All questions were answered, and informed consent                            was obtained. Prior Anticoagulants: The patient has  taken no previous anticoagulant or antiplatelet                            agents. ASA Grade Assessment: III - A patient with                            severe systemic disease. After reviewing the risks                            and benefits, the patient was deemed in                            satisfactory condition to undergo the procedure.                           After obtaining informed consent, the colonoscope                      was passed under direct vision. Throughout the                            procedure, the patient's blood pressure, pulse, and                            oxygen saturations were monitored continuously. The                            Colonoscope was introduced through the anus and                            advanced to the the cecum, identified by                            appendiceal orifice and ileocecal valve. The                            colonoscopy was performed without difficulty. The                            patient tolerated the procedure well. The quality                            of the bowel preparation was adequate. The                            ileocecal valve, appendiceal orifice, and rectum                            were photographed. Scope In: 2:57:58 PM Scope Out: 3:16:17 PM Scope Withdrawal Time: 0 hours 13 minutes 8 seconds  Total Procedure Duration: 0 hours 18 minutes 19 seconds  Findings:                 Skin tags were found on perianal exam.  The colon appeared normal.                           Non-bleeding internal hemorrhoids were found during                            retroflexion. The hemorrhoids were small. Complications:            No immediate complications. Estimated Blood Loss:     Estimated blood loss: none. Impression:               - Perianal skin tags found on perianal exam.                           - The entire examined colon is normal.                           - Non-bleeding internal hemorrhoids.                           - No specimens collected. Recommendation:           - Patient has a contact number available for                            emergencies. The signs and symptoms of potential                            delayed complications were discussed with the                            patient. Return to normal activities tomorrow.                            Written discharge instructions were  provided to the                            patient.                           - Resume previous diet.                           - Continue present medications.                           - Repeat colonoscopy in 10 years for screening                            purposes.                           - Return to GI office PRN. Gerrit Heck, MD 09/10/2019 3:20:40 PM

## 2019-09-10 NOTE — Patient Instructions (Signed)
Information on hemorrhoids given to you today.  Repeat colonoscopy in 10 years.  YOU HAD AN ENDOSCOPIC PROCEDURE TODAY AT THE [facility]:   Refer to the procedure report that was given to you for any specific questions about what was found during the examination.  If the procedure report does not answer your questions, please call your gastroenterologist to clarify.  If you requested that your care partner not be given the details of your procedure findings, then the procedure report has been included in a sealed envelope for you to review at your convenience later.  YOU SHOULD EXPECT: Some feelings of bloating in the abdomen. Passage of more gas than usual.  Walking can help get rid of the air that was put into your GI tract during the procedure and reduce the bloating. If you had a lower endoscopy (such as a colonoscopy or flexible sigmoidoscopy) you may notice spotting of blood in your stool or on the toilet paper. If you underwent a bowel prep for your procedure, you may not have a normal bowel movement for a few days.  Please Note:  You might notice some irritation and congestion in your nose or some drainage.  This is from the oxygen used during your procedure.  There is no need for concern and it should clear up in a day or so.  SYMPTOMS TO REPORT IMMEDIATELY:   Following lower endoscopy (colonoscopy or flexible sigmoidoscopy):  Excessive amounts of blood in the stool  Significant tenderness or worsening of abdominal pains  Swelling of the abdomen that is new, acute  Fever of 100F or higher   For urgent or emergent issues, a gastroenterologist can be reached at any hour by calling [phone]. Do not use MyChart messaging for urgent concerns.    DIET:  We do recommend a small meal at first, but then you may proceed to your regular diet.  Drink plenty of fluids but you should avoid alcoholic beverages for 24 hours.  ACTIVITY:  You should plan to take it easy for the  rest of today and you should NOT DRIVE or use heavy machinery until tomorrow (because of the sedation medicines used during the test).    FOLLOW UP: Our staff will call the number listed on your records 48-72 hours following your procedure to check on you and address any questions or concerns that you may have regarding the information given to you following your procedure. If we do not reach you, we will leave a message.  We will attempt to reach you two times.  During this call, we will ask if you have developed any symptoms of COVID 19. If you develop any symptoms (ie: fever, flu-like symptoms, shortness of breath, cough etc.) before then, please call [phone].  If you test positive for Covid 19 in the 2 weeks post procedure, please call and report this information to us.    If any biopsies were taken you will be contacted by phone or by letter within the next 1-3 weeks.  Please call us at [phone] if you have not heard about the biopsies in 3 weeks.    SIGNATURES/CONFIDENTIALITY: You and/or your care partner have signed paperwork which will be entered into your electronic medical record.  These signatures attest to the fact that that the information above on your After Visit Summary has been reviewed and is understood.  Full responsibility of the confidentiality of this discharge information lies with you and/or your care-partner. 

## 2019-09-11 ENCOUNTER — Encounter (INDEPENDENT_AMBULATORY_CARE_PROVIDER_SITE_OTHER): Payer: Self-pay | Admitting: Family Medicine

## 2019-09-12 ENCOUNTER — Telehealth: Payer: Self-pay | Admitting: *Deleted

## 2019-09-12 ENCOUNTER — Telehealth: Payer: Self-pay | Admitting: Gastroenterology

## 2019-09-12 ENCOUNTER — Telehealth: Payer: Self-pay

## 2019-09-12 NOTE — Telephone Encounter (Signed)
Pt had a colonoscopy 09/10/19.  Pt reported that she has an issue with flatulence.  Please advise.

## 2019-09-12 NOTE — Telephone Encounter (Signed)
Spoke to patient to inform her that after having a colonoscopy it is common to feel bloated and gassy for 3-5 days. That she should eat small frequent bland meals, to stay away from foods that are hard to digest. Examples were given of foods to eat and stay away from. She will also try OTC Gas-X.Patient will call back on Friday if her symptoms do not improve.

## 2019-09-12 NOTE — Telephone Encounter (Signed)
  Follow up Call-  Call back number 09/10/2019  Post procedure Call Back phone  # [phone]  Permission to leave phone message Yes  Some recent data might be hidden     No answer at # given.  LM on VM.

## 2019-09-12 NOTE — Telephone Encounter (Signed)
Attempted to reach patient for post-procedure f/u call. No answer. Left message for her to please not hesitate to call us if she has any questions/concerns regarding her care. 

## 2019-09-25 ENCOUNTER — Telehealth: Payer: Self-pay | Admitting: *Deleted

## 2019-09-25 NOTE — Telephone Encounter (Signed)
Patient has a Botox appointment on 10/10/2019.  I called [employer] [phone] and spoke to [place]. She states [phone] and [phone] are valid and billable and do not require PA.  Ref# for this call is [id number].

## 2019-10-07 ENCOUNTER — Other Ambulatory Visit (HOSPITAL_COMMUNITY): Payer: Self-pay | Admitting: Psychiatry

## 2019-10-07 DIAGNOSIS — G4700 Insomnia, unspecified: Secondary | ICD-10-CM

## 2019-10-07 DIAGNOSIS — F3181 Bipolar II disorder: Secondary | ICD-10-CM

## 2019-10-07 DIAGNOSIS — F333 Major depressive disorder, recurrent, severe with psychotic symptoms: Secondary | ICD-10-CM

## 2019-10-07 DIAGNOSIS — F411 Generalized anxiety disorder: Secondary | ICD-10-CM

## 2019-10-07 DIAGNOSIS — F4001 Agoraphobia with panic disorder: Secondary | ICD-10-CM

## 2019-10-09 ENCOUNTER — Ambulatory Visit (INDEPENDENT_AMBULATORY_CARE_PROVIDER_SITE_OTHER): Payer: Medicare Other | Admitting: Family Medicine

## 2019-10-09 ENCOUNTER — Other Ambulatory Visit: Payer: Self-pay

## 2019-10-09 ENCOUNTER — Encounter (INDEPENDENT_AMBULATORY_CARE_PROVIDER_SITE_OTHER): Payer: Self-pay | Admitting: Family Medicine

## 2019-10-09 VITALS — BP 131/82 | HR 90 | Temp 97.9°F | Ht 65.0 in | Wt 243.0 lb

## 2019-10-09 DIAGNOSIS — Z9884 Bariatric surgery status: Secondary | ICD-10-CM | POA: Diagnosis not present

## 2019-10-09 DIAGNOSIS — F419 Anxiety disorder, unspecified: Secondary | ICD-10-CM

## 2019-10-09 DIAGNOSIS — Z6841 Body Mass Index (BMI) 40.0 and over, adult: Secondary | ICD-10-CM | POA: Diagnosis not present

## 2019-10-09 DIAGNOSIS — R7303 Prediabetes: Secondary | ICD-10-CM

## 2019-10-10 ENCOUNTER — Other Ambulatory Visit (INDEPENDENT_AMBULATORY_CARE_PROVIDER_SITE_OTHER): Payer: Self-pay | Admitting: Family Medicine

## 2019-10-10 ENCOUNTER — Ambulatory Visit (INDEPENDENT_AMBULATORY_CARE_PROVIDER_SITE_OTHER): Payer: Medicare Other | Admitting: Neurology

## 2019-10-10 DIAGNOSIS — R7303 Prediabetes: Secondary | ICD-10-CM

## 2019-10-10 DIAGNOSIS — G43711 Chronic migraine without aura, intractable, with status migrainosus: Secondary | ICD-10-CM

## 2019-10-10 MED ORDER — EMGALITY 120 MG/ML ~~LOC~~ SOAJ: 120.0000 mg | SUBCUTANEOUS | 11 refills | Status: DC

## 2019-10-10 MED ORDER — TRULICITY 0.75 MG/0.5ML ~~LOC~~ SOAJ: 0.7500 mg | SUBCUTANEOUS | 0 refills | Status: DC

## 2019-10-10 NOTE — Progress Notes (Signed)
Botox- 200 units x 1 vial Lot: [id number] Expiration: 02/2022 NDC: 0023-3921-02  Bacteriostatic 0.9% Sodium Chloride- 4mL total Lot: [id number] Expiration: 11/22/2019 NDC: 0409-1966-02  Dx: G43.711 B/B  

## 2019-10-10 NOTE — Progress Notes (Signed)
Chief Complaint:   OBESITY [name] is here to discuss her progress with her obesity treatment plan along with follow-up of her obesity related diagnoses. [name] is on keeping a food journal and adhering to recommended goals of 1500 calories and 75 grams of protein and states she is following her eating plan approximately 70% of the time. [name] states she is walking for 30 minutes 3 times per week.  Today's visit was #: 11 Starting weight: 250 lbs Starting date: 03/27/2019 Today's weight: 243 lbs Today's date: 10/09/2019 Total lbs lost to date: 7 lbs Total lbs lost since last in-office visit: 0  Interim History: [name] says that it has been a tough few months for her.  There was a death in her family.  Her fiance cheated on her and she will be moving out on Friday.  She reports that her children are still very supportive and have been helping her get up and out a bit.  She has no appetite.  She says she will at least have 4 protein drinks a day.  She is terrified of weight gain.  Subjective:   1. Prediabetes [name] has a diagnosis of prediabetes based on her elevated HgA1c and was informed this puts her at greater risk of developing diabetes. She continues to work on diet and exercise to decrease her risk of diabetes. She denies nausea or hypoglycemia.  Lab Results  Component Value Date   HGBA1C 5.5 06/27/2019   Lab Results  Component Value Date   INSULIN 7.9 03/27/2019   2. S/P gastric bypass [name] had gastric bypass surgery in 2008.  3. Anxiety, panic attack [name] has the following anxiety symptoms: panic attacks.  No SI or HI.   Assessment/Plan:   1. Prediabetes [name] will continue to work on weight loss, exercise, and decreasing simple carbohydrates to help decrease the risk of diabetes.   Orders - Dulaglutide (TRULICITY) 0.75 MG/0.5ML SOPN; Inject 0.75 mg into the skin once a week.  Dispense: 4 pen; Refill: 0  2. S/P gastric bypass [name] is at risk  for malnutrition due to her previous bariatric surgery.   Counseling  You may need to eat 3 meals and 2 snacks, or 5 small meals each day in order to reach your protein and calorie goals.   Allow at least 15 minutes for each meal so that you can eat mindfully. Listen to your body so that you do not overeat. For most people, your sleeve or pouch will comfortably hold 4-6 ounces.  Eat foods from all food groups. This includes fruits and vegetables, grains, dairy, and meat and other proteins.  Include a protein-rich food at every meal and snack, and eat the protein food first.   You should be taking a Bariatric Multivitamin as well as calcium.   3. Anxiety, panic attack [name] will call her psychiatrist for a visit.  Behavior modification techniques were discussed today to help [name] deal with her anxiety.  Orders and follow up as documented in patient record.   4. Class 3 severe obesity with serious comorbidity and body mass index (BMI) of 40.0 to 44.9 in adult, unspecified obesity type [name]) [name] is currently in the action stage of change. As such, her goal is to continue with weight loss efforts. She has agreed to keeping a food journal and adhering to recommended goals of 1500 calories and 75 grams of protein.   Exercise goals: No exercise has been prescribed at this time.  Behavioral modification strategies: increasing  lean protein intake and increasing water intake.  [name] has agreed to follow-up with our clinic in 2 weeks. She was informed of the importance of frequent follow-up visits to maximize her success with intensive lifestyle modifications for her multiple health conditions.   Objective:   Blood pressure 131/82, pulse 90, temperature 97.9 F (36.6 C), temperature source Oral, height 5\' 5"  (1.651 m), weight 243 lb (110.2 kg), SpO2 100 %. Body mass index is 40.44 kg/m.  General: Cooperative, alert, well developed, in no acute distress. HEENT: Conjunctivae and lids  unremarkable. Cardiovascular: Regular rhythm.  Lungs: Normal work of breathing. Neurologic: No focal deficits.   Lab Results  Component Value Date   CREATININE 0.76 06/27/2019   BUN 11 06/27/2019   NA 143 06/27/2019   K 4.3 06/27/2019   CL 105 06/27/2019   CO2 24 06/27/2019   Lab Results  Component Value Date   ALT 11 06/27/2019   AST 14 06/27/2019   ALKPHOS 72 06/27/2019   BILITOT <0.2 06/27/2019   Lab Results  Component Value Date   HGBA1C 5.5 06/27/2019   HGBA1C 5.9 (H) 03/27/2019   Lab Results  Component Value Date   INSULIN 7.9 03/27/2019   Lab Results  Component Value Date   TSH 3.620 06/27/2019   Lab Results  Component Value Date   CHOL 187 03/27/2019   HDL 101 03/27/2019   LDLCALC 68 03/27/2019   TRIG 102 03/27/2019   Lab Results  Component Value Date   WBC 5.6 06/27/2019   HGB 13.1 06/27/2019   HCT 40.6 06/27/2019   MCV 87 06/27/2019   PLT 256 06/27/2019   Obesity Behavioral Intervention:   Approximately 15 minutes were spent on the discussion below.  ASK: We discussed the diagnosis of obesity with [name] today and [name] agreed to give [name] permission to discuss obesity behavioral modification therapy today.  ASSESS: [name] has the diagnosis of obesity and her BMI today is 40.5. [name] is in the action stage of change.   ADVISE: [name] was educated on the multiple health risks of obesity as well as the benefit of weight loss to improve her health. She was advised of the need for long term treatment and the importance of lifestyle modifications to improve her current health and to decrease her risk of future health problems.  AGREE: Multiple dietary modification options and treatment options were discussed and [name] agreed to follow the recommendations documented in the above note.  ARRANGE: [name] was educated on the importance of frequent visits to treat obesity as outlined per CMS and USPSTF guidelines and agreed to schedule her next  follow up appointment today.  Attestation Statements:   Reviewed by clinician on day of visit: allergies, medications, problem list, medical history, surgical history, family history, social history, and previous encounter notes.  I, Water quality scientist, CMA, am acting as Location manager for [employer], DO.  I have reviewed the above documentation for accuracy and completeness, and I agree with the above. Briscoe Deutscher, DO

## 2019-10-10 NOTE — Progress Notes (Signed)
Consent Form Botulism Toxin Injection For Chronic Migraine  Interval history 07/10/2019: This is our second botox of this botox series.Did great, about 50% improvement in frequency but also in severity. Ajovy not approve will give emgality instead. she has failed imitrex and maxalt and cannot take anymore triptans, contraindicated due to side effects, did not like nurtec. She feels clenching is a problem and she has pin when clenching in the temples as well. She has HTN on aimovig, stopped. She had botox in the past and we restarted on 03/2019, doing great. Loves emgality as an add on as well.   Reviewed orally with patient, additionally signature is on file:  Botulism toxin has been approved by the Federal drug administration for treatment of chronic migraine. Botulism toxin does not cure chronic migraine and it may not be effective in some patients.  The administration of botulism toxin is accomplished by injecting a small amount of toxin into the muscles of the neck and head. Dosage must be titrated for each individual. Any benefits resulting from botulism toxin tend to wear off after 3 months with a repeat injection required if benefit is to be maintained. Injections are usually done every 3-4 months with maximum effect peak achieved by about 2 or 3 weeks. Botulism toxin is expensive and you should be sure of what costs you will incur resulting from the injection.  The side effects of botulism toxin use for chronic migraine may include:   -Transient, and usually mild, facial weakness with facial injections  -Transient, and usually mild, head or neck weakness with head/neck injections  -Reduction or loss of forehead facial animation due to forehead muscle weakness  -Eyelid drooping  -Dry eye  -Pain at the site of injection or bruising at the site of injection  -Double vision  -Potential unknown long term risks  Contraindications: You should not have Botox if you are pregnant, nursing,  allergic to albumin, have an infection, skin condition, or muscle weakness at the site of the injection, or have myasthenia gravis, Lambert-Eaton syndrome, or ALS.  It is also possible that as with any injection, there may be an allergic reaction or no effect from the medication. Reduced effectiveness after repeated injections is sometimes seen and rarely infection at the injection site may occur. All care will be taken to prevent these side effects. If therapy is given over a long time, atrophy and wasting in the muscle injected may occur. Occasionally the patient's become refractory to treatment because they develop antibodies to the toxin. In this event, therapy needs to be modified.  I have read the above information and consent to the administration of botulism toxin.    BOTOX PROCEDURE NOTE FOR MIGRAINE HEADACHE    Contraindications and precautions discussed with patient(above). Aseptic procedure was observed and patient tolerated procedure. Procedure performed by Dr. Georgia Dom  The condition has existed for more than 6 months, and pt does not have a diagnosis of ALS, Myasthenia Gravis or Lambert-Eaton Syndrome.  Risks and benefits of injections discussed and pt agrees to proceed with the procedure.  Written consent obtained  These injections are medically necessary. Pt  receives good benefits from these injections. These injections do not cause sedations or hallucinations which the oral therapies may cause.  Description of procedure:  The patient was placed in a sitting position. The standard protocol was used for Botox as follows, with 5 units of Botox injected at each site:   -Procerus muscle, midline injection  -Corrugator muscle, bilateral injection  -  Frontalis muscle, bilateral injection, with 2 sites each side, medial injection was performed in the upper one third of the frontalis muscle, in the region vertical from the medial inferior edge of the superior orbital rim. The  lateral injection was again in the upper one third of the forehead vertically above the lateral limbus of the cornea, 1.5 cm lateral to the medial injection site.  -Temporalis muscle injection, 4 sites, bilaterally. The first injection was 3 cm above the tragus of the ear, second injection site was 1.5 cm to 3 cm up from the first injection site in line with the tragus of the ear. The third injection site was 1.5-3 cm forward between the first 2 injection sites. The fourth injection site was 1.5 cm posterior to the second injection site.   -Occipitalis muscle injection, 3 sites, bilaterally. The first injection was done one half way between the occipital protuberance and the tip of the mastoid process behind the ear. The second injection site was done lateral and superior to the first, 1 fingerbreadth from the first injection. The third injection site was 1 fingerbreadth superiorly and medially from the first injection site.  -Cervical paraspinal muscle injection, 2 sites, bilateral knee first injection site was 1 cm from the midline of the cervical spine, 3 cm inferior to the lower border of the occipital protuberance. The second injection site was 1.5 cm superiorly and laterally to the first injection site.  -Trapezius muscle injection was performed at 3 sites, bilaterally. The first injection site was in the upper trapezius muscle halfway between the inflection point of the neck, and the acromion. The second injection site was one half way between the acromion and the first injection site. The third injection was done between the first injection site and the inflection point of the neck.   Will return for repeat injection in 3 months.   200 units of Botox was used, any Botox not injected was wasted. The patient tolerated the procedure well, there were no complications of the above procedure.

## 2019-10-11 MED ORDER — METFORMIN HCL 500 MG PO TABS: ORAL_TABLET | ORAL | 0 refills | Status: DC

## 2019-10-18 ENCOUNTER — Telehealth (INDEPENDENT_AMBULATORY_CARE_PROVIDER_SITE_OTHER): Payer: Medicare Other | Admitting: Psychiatry

## 2019-10-18 ENCOUNTER — Other Ambulatory Visit: Payer: Self-pay

## 2019-10-18 ENCOUNTER — Encounter (HOSPITAL_COMMUNITY): Payer: Self-pay | Admitting: Psychiatry

## 2019-10-18 DIAGNOSIS — F419 Anxiety disorder, unspecified: Secondary | ICD-10-CM | POA: Diagnosis not present

## 2019-10-18 DIAGNOSIS — F333 Major depressive disorder, recurrent, severe with psychotic symptoms: Secondary | ICD-10-CM | POA: Diagnosis not present

## 2019-10-18 DIAGNOSIS — F3181 Bipolar II disorder: Secondary | ICD-10-CM

## 2019-10-18 DIAGNOSIS — F4001 Agoraphobia with panic disorder: Secondary | ICD-10-CM

## 2019-10-18 DIAGNOSIS — G4700 Insomnia, unspecified: Secondary | ICD-10-CM

## 2019-10-18 DIAGNOSIS — F411 Generalized anxiety disorder: Secondary | ICD-10-CM | POA: Diagnosis not present

## 2019-10-18 MED ORDER — DOXEPIN HCL 10 MG/ML PO CONC: ORAL | 1 refills | Status: DC

## 2019-10-18 MED ORDER — DIVALPROEX SODIUM ER 250 MG PO TB24: 500.0000 mg | ORAL_TABLET | Freq: Two times a day (BID) | ORAL | 1 refills | Status: DC

## 2019-10-18 MED ORDER — DULOXETINE HCL 30 MG PO CPEP: 30.0000 mg | ORAL_CAPSULE | Freq: Every day | ORAL | 0 refills | Status: DC

## 2019-10-18 MED ORDER — PRAZOSIN HCL 1 MG PO CAPS: 1.0000 mg | ORAL_CAPSULE | Freq: Every day | ORAL | 0 refills | Status: DC

## 2019-10-18 MED ORDER — CLONAZEPAM 0.5 MG PO TABS: 0.5000 mg | ORAL_TABLET | Freq: Three times a day (TID) | ORAL | 1 refills | Status: DC | PRN
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
## 2019-10-23 ENCOUNTER — Encounter (INDEPENDENT_AMBULATORY_CARE_PROVIDER_SITE_OTHER): Payer: Self-pay | Admitting: Family Medicine

## 2019-10-23 DIAGNOSIS — R609 Edema, unspecified: Secondary | ICD-10-CM

## 2019-10-24 MED ORDER — HYDROCHLOROTHIAZIDE 12.5 MG PO CAPS: 12.5000 mg | ORAL_CAPSULE | Freq: Every day | ORAL | 0 refills | Status: DC | PRN

## 2019-10-30 ENCOUNTER — Encounter (INDEPENDENT_AMBULATORY_CARE_PROVIDER_SITE_OTHER): Payer: Self-pay | Admitting: Family Medicine

## 2019-10-30 ENCOUNTER — Other Ambulatory Visit: Payer: Self-pay

## 2019-10-30 ENCOUNTER — Ambulatory Visit (INDEPENDENT_AMBULATORY_CARE_PROVIDER_SITE_OTHER): Payer: Medicare Other | Admitting: Family Medicine

## 2019-10-30 VITALS — BP 123/85 | HR 94 | Temp 98.4°F | Ht 65.0 in | Wt 240.0 lb

## 2019-10-30 DIAGNOSIS — Z6841 Body Mass Index (BMI) 40.0 and over, adult: Secondary | ICD-10-CM | POA: Diagnosis not present

## 2019-10-30 DIAGNOSIS — E8881 Metabolic syndrome: Secondary | ICD-10-CM

## 2019-10-30 DIAGNOSIS — E559 Vitamin D deficiency, unspecified: Secondary | ICD-10-CM | POA: Diagnosis not present

## 2019-10-30 DIAGNOSIS — G8929 Other chronic pain: Secondary | ICD-10-CM

## 2019-10-31 ENCOUNTER — Telehealth: Payer: Self-pay | Admitting: Neurology

## 2019-10-31 MED ORDER — ONDANSETRON HCL 4 MG PO TABS: 4.0000 mg | ORAL_TABLET | Freq: Three times a day (TID) | ORAL | 0 refills | Status: DC | PRN

## 2019-10-31 MED ORDER — VITAMIN D (ERGOCALCIFEROL) 1.25 MG (50000 UNIT) PO CAPS: 50000.0000 [IU] | ORAL_CAPSULE | ORAL | 0 refills | Status: DC

## 2019-10-31 MED ORDER — TRULICITY 0.75 MG/0.5ML ~~LOC~~ SOAJ: 0.7500 mg | SUBCUTANEOUS | 0 refills | Status: DC

## 2019-10-31 MED ORDER — GABAPENTIN 600 MG PO TABS: 600.0000 mg | ORAL_TABLET | Freq: Three times a day (TID) | ORAL | 0 refills | Status: DC

## 2019-10-31 NOTE — Telephone Encounter (Signed)
Pt called and LVM stating she is needing to discuss her medications with provider or RN. Please advise.

## 2019-10-31 NOTE — Telephone Encounter (Signed)
I returned the call to the patient. She stated that she did not have any medication questions. I told her to call us back if she needs anything further.

## 2019-10-31 NOTE — Progress Notes (Signed)
Chief Complaint:   OBESITY [name] is here to discuss her progress with her obesity treatment plan along with follow-up of her obesity related diagnoses. [name] is on keeping a food journal and adhering to recommended goals of 1500 calories and 75 grams of protein and states she is following her eating plan approximately 75% of the time. [name] states she is walking and using the Ab Lounger for 30 minutes 3 times per week.  Today's visit was #: 12 Starting weight: 250 lbs Starting date: 03/27/2019 Today's weight: 240 lbs Today's date: 10/30/2019 Total lbs lost to date: 10 lbs Total lbs lost since last in-office visit: 3 lbs  Interim History: [name] has been off pain medication for about 1 week.  PCP note reviewed.  [name] complains of nausea, increased pain, decreased sleep.  Unable to to pick up gabapentin until 11/02/2019 due to insurance.  She says she is having increased migraines.  She is making sure to get "at least" 4 protein shakes each day if she is not eating.  Assessment/Plan:   1. Vitamin D deficiency Low Vitamin D level contributes to fatigue and are associated with obesity, breast, and colon cancer. She agrees to continue to take prescription Vitamin D @50 ,000 IU every week and will follow-up for routine testing of Vitamin D, at least 2-3 times per year to avoid over-replacement.  Orders - Vitamin D, Ergocalciferol, (DRISDOL) 1.25 MG (50000 UNIT) CAPS capsule; Take 1 capsule (50,000 Units total) by mouth every 7 (seven) days.  Dispense: 4 capsule; Refill: 0  2. Insulin resistance [name] will continue to work on weight loss, exercise, and decreasing simple carbohydrates to help decrease the risk of diabetes. [name] agreed to follow-up with [name] as directed to closely monitor her progress.  Orders - Dulaglutide (TRULICITY) 0.75 MG/0.5ML SOPN; Inject 0.5 mLs (0.75 mg total) into the skin once a week.  Dispense: 4 pen; Refill: 0  3. Other chronic pain Will refill  gabapentin today as a courtesy at different dose in order for her to have prescription to get to 11/02/19.  Orders - gabapentin (NEURONTIN) 600 MG tablet; Take 1 tablet (600 mg total) by mouth 3 (three) times daily.  Dispense: 90 tablet; Refill: 0 - ondansetron (ZOFRAN) 4 MG tablet; Take 1 tablet (4 mg total) by mouth every 8 (eight) hours as needed for nausea or vomiting.  Dispense: 20 tablet; Refill: 0  4. Class 3 severe obesity with serious comorbidity and body mass index (BMI) of 40.0 to 44.9 in adult, unspecified obesity type [name]) [name] is currently in the action stage of change. As such, her goal is to continue with weight loss efforts. She has agreed to keeping a food journal and adhering to recommended goals of 1500 calories and 75 grams of protein.   Exercise goals: All adults should avoid inactivity. Some physical activity is better than none, and adults who participate in any amount of physical activity gain some health benefits.  Behavioral modification strategies: increasing lean protein intake and increasing water intake.  [name] has agreed to follow-up with [name] in 2 weeks. She was informed of the importance of frequent follow-up visits to maximize her success with intensive lifestyle modifications for her multiple health conditions.   Objective:   Blood pressure 123/85, pulse 94, temperature 98.4 F (36.9 C), temperature source Oral, height 5\' 5"  (1.651 m), weight 240 lb (108.9 kg), SpO2 98 %. Body mass index is 39.94 kg/m.  General: Cooperative, alert, well developed, in no acute distress.  HEENT: Conjunctivae and lids unremarkable. Cardiovascular: Regular rhythm.  Lungs: Normal work of breathing. Neurologic: No focal deficits.   Lab Results  Component Value Date   CREATININE 0.76 06/27/2019   BUN 11 06/27/2019   NA 143 06/27/2019   [name] 4.3 06/27/2019   CL 105 06/27/2019   CO2 24 06/27/2019   Lab Results  Component Value Date   ALT 11 06/27/2019   AST 14  06/27/2019   ALKPHOS 72 06/27/2019   BILITOT <0.2 06/27/2019   Lab Results  Component Value Date   HGBA1C 5.5 06/27/2019   HGBA1C 5.9 (H) 03/27/2019   Lab Results  Component Value Date   INSULIN 7.9 03/27/2019   Lab Results  Component Value Date   TSH 3.620 06/27/2019   Lab Results  Component Value Date   CHOL 187 03/27/2019   HDL 101 03/27/2019   LDLCALC 68 03/27/2019   TRIG 102 03/27/2019   Lab Results  Component Value Date   WBC 5.6 06/27/2019   HGB 13.1 06/27/2019   HCT 40.6 06/27/2019   MCV 87 06/27/2019   PLT 256 06/27/2019   Obesity Behavioral Intervention:   Approximately 15 minutes were spent on the discussion below.  ASK: We discussed the diagnosis of obesity with [name] today and [name] agreed to give [name] permission to discuss obesity behavioral modification therapy today.  ASSESS: [name] has the diagnosis of obesity and her BMI today is 40.1. Kadee is in the action stage of change.   ADVISE: Bethany was educated on the multiple health risks of obesity as well as the benefit of weight loss to improve her health. She was advised of the need for long term treatment and the importance of lifestyle modifications to improve her current health and to decrease her risk of future health problems.  AGREE: Multiple dietary modification options and treatment options were discussed and Nevia agreed to follow the recommendations documented in the above note.  ARRANGE: Crestina was educated on the importance of frequent visits to treat obesity as outlined per CMS and USPSTF guidelines and agreed to schedule her next follow up appointment today.  Attestation Statements:   Reviewed by clinician on day of visit: allergies, medications, problem list, medical history, surgical history, family history, social history, and previous encounter notes.  I, Water quality scientist, CMA, am acting as transcriptionist for Briscoe Deutscher, DO  I have reviewed the above documentation for  accuracy and completeness, and I agree with the above. Briscoe Deutscher, DO

## 2019-11-08 ENCOUNTER — Encounter (HOSPITAL_COMMUNITY): Payer: Self-pay | Admitting: Psychiatry

## 2019-11-08 ENCOUNTER — Other Ambulatory Visit: Payer: Self-pay

## 2019-11-08 ENCOUNTER — Telehealth (INDEPENDENT_AMBULATORY_CARE_PROVIDER_SITE_OTHER): Payer: Medicare Other | Admitting: Psychiatry

## 2019-11-08 DIAGNOSIS — G4700 Insomnia, unspecified: Secondary | ICD-10-CM

## 2019-11-08 DIAGNOSIS — F3181 Bipolar II disorder: Secondary | ICD-10-CM

## 2019-11-08 DIAGNOSIS — F419 Anxiety disorder, unspecified: Secondary | ICD-10-CM

## 2019-11-08 MED ORDER — DOXEPIN HCL 50 MG PO CAPS: 50.0000 mg | ORAL_CAPSULE | Freq: Every day | ORAL | 1 refills | Status: DC

## 2019-11-08 MED ORDER — CLONAZEPAM 0.5 MG PO TABS: 0.5000 mg | ORAL_TABLET | Freq: Three times a day (TID) | ORAL | 1 refills | Status: DC | PRN

## 2019-11-08 MED ORDER — DIVALPROEX SODIUM ER 250 MG PO TB24: 500.0000 mg | ORAL_TABLET | Freq: Two times a day (BID) | ORAL | 1 refills | Status: DC

## 2019-11-08 MED ORDER — PRAZOSIN HCL 1 MG PO CAPS: 1.0000 mg | ORAL_CAPSULE | Freq: Every day | ORAL | 0 refills | Status: DC

## 2019-11-08 NOTE — Progress Notes (Signed)
Virtual Visit via Telephone Note  I connected with [name] on 11/08/19 at  3:30 PM EDT by telephone and verified that I am speaking with the correct person using two identifiers.  Location: Patient: driving in car Provider: office   I discussed the limitations, risks, security and privacy concerns of performing an evaluation and management service by telephone and the availability of in person appointments. I also discussed with the patient that there may be a patient responsible charge related to this service. The patient expressed understanding and agreed to proceed.   History of Present Illness: [name] shares that she is feeling calm and less stressed since increasing the Depakote. She is not feeling as anxious at home. She has anxiety while out in public related to potential COVID exposure. [name] often feels like others are talking about her in public and as a result she doesn't go out much. She gets about 5-6 hrs of sleep when she takes Doxepin 30mg . As a result she runs out of Doxepin early and does not sleep without it. One night she took 4 Gabapentin and 30mg  of Doxepin and slept really well. She has not discussed this with the physician who prescribes Gabapentin. She had to find a new pain doctor and has an appointment early next month. She was cut off pain meds "cold " and went thru withdrawal for 2 weeks. She is feeling better but is in pain. [name] has nightmares but doesn't recall having them. She was told by her boyfriend. Her depression has improved some and it is now a 6/10 rather than 9/10 (10 being the worst). She denies SI/HI.    Observations/Objective:  General Appearance: unable to assess  Eye Contact:  unable to assess  Speech:  Clear and Coherent and Normal Rate  Volume:  Normal  Mood:  Anxious and Depressed  Affect:  Congruent and calmer and brighter than previous visits  Thought Process:  Coherent and Descriptions of Associations: Circumstantial   Orientation:  Full (Time, Place, and Person)  Thought Content:  Tangential  Suicidal Thoughts:  No  Homicidal Thoughts:  No  Memory:  Immediate;   Fair  Judgement:  Poor  Insight:  Present  Psychomotor Activity: unable to assess  Concentration:  Concentration: Fair  Recall:  [place] of Knowledge:  Good  Language:  Good  Akathisia:  unable to assess  Handed:  Right  AIMS (if indicated):     Assets:  Communication Skills Desire for Improvement Financial Resources/Insurance Housing Social Support Talents/Skills Transportation Vocational/Educational  ADL's:  unable to assess  Cognition:  WNL  Sleep:         Assessment and Plan: Bipolar 2 d/o- current episode depressed; GAD; Panic disorder with agoraphobia; Social anxiety disorder; Insomnia  Depakote ER 500mg  po BID  Increase Doxepin 50mg  po qHS  Prazosin 1mg  po qHS  Klonopin 0.5mg  po TID prn anxiety  stop Cymbalta  Stop liquid Doxepin  Labs: ordered Depakote level, CBC (platelets), CMP   Follow Up Instructions: In 4-8 weeks or sooner if needed   I discussed the assessment and treatment plan with the patient. The patient was provided an opportunity to ask questions and all were answered. The patient agreed with the plan and demonstrated an understanding of the instructions.   The patient was advised to call back or seek an in-person evaluation if the symptoms worsen or if the condition fails to improve as anticipated.  I provided 20 minutes of non-face-to-face time during this encounter.  Charlcie Cradle, MD

## 2019-11-09 ENCOUNTER — Other Ambulatory Visit (HOSPITAL_COMMUNITY): Payer: Self-pay | Admitting: *Deleted

## 2019-11-09 DIAGNOSIS — Z79899 Other long term (current) drug therapy: Secondary | ICD-10-CM

## 2019-11-10 LAB — CMP14+EGFR
ALT: 12 IU/L (ref 0–32)
AST: 17 IU/L (ref 0–40)
Albumin/Globulin Ratio: 1.5 (ref 1.2–2.2)
Albumin: 4.8 g/dL (ref 3.8–4.9)
Alkaline Phosphatase: 73 IU/L (ref 48–121)
BUN/Creatinine Ratio: 26 — ABNORMAL HIGH (ref 9–23)
BUN: 21 mg/dL (ref 6–24)
Bilirubin Total: <0.2 mg/dL (ref 0.0–1.2)
CO2: 27 mmol/L (ref 20–29)
Calcium: 9.6 mg/dL (ref 8.7–10.2)
Chloride: 102 mmol/L (ref 96–106)
Creatinine, Ser: 0.8 mg/dL (ref 0.57–1.00)
GFR calc Af Amer: 95 mL/min/{1.73_m2} (ref >59)
GFR calc non Af Amer: 83 mL/min/{1.73_m2} (ref >59)
Globulin, Total: 3.2 g/dL (ref 1.5–4.5)
Glucose: 86 mg/dL (ref 65–99)
Potassium: 4.6 mmol/L (ref 3.5–5.2)
Sodium: 147 mmol/L — ABNORMAL HIGH (ref 134–144)
Total Protein: 8 g/dL (ref 6.0–8.5)

## 2019-11-10 LAB — CBC WITH DIFFERENTIAL/PLATELET
Basophils Absolute: 0.1 10*3/uL (ref 0.0–0.2)
Basos: 1 %
EOS (ABSOLUTE): 0.1 10*3/uL (ref 0.0–0.4)
Eos: 2 %
Hematocrit: 39.2 % (ref 34.0–46.6)
Hemoglobin: 13.2 g/dL (ref 11.1–15.9)
Immature Grans (Abs): 0 10*3/uL (ref 0.0–0.1)
Immature Granulocytes: 0 %
Lymphocytes Absolute: 2.3 10*3/uL (ref 0.7–3.1)
Lymphs: 29 %
MCH: 29.3 pg (ref 26.6–33.0)
MCHC: 33.7 g/dL (ref 31.5–35.7)
MCV: 87 fL (ref 79–97)
Monocytes Absolute: 1 10*3/uL — ABNORMAL HIGH (ref 0.1–0.9)
Monocytes: 12 %
Neutrophils Absolute: 4.4 10*3/uL (ref 1.4–7.0)
Neutrophils: 56 %
Platelets: 285 10*3/uL (ref 150–450)
RBC: 4.5 x10E6/uL (ref 3.77–5.28)
RDW: 13.3 % (ref 11.7–15.4)
WBC: 7.9 10*3/uL (ref 3.4–10.8)

## 2019-11-10 LAB — VALPROIC ACID LEVEL: Valproic Acid Lvl: 42 ug/mL — ABNORMAL LOW (ref 50–100)

## 2019-11-12 ENCOUNTER — Other Ambulatory Visit (HOSPITAL_COMMUNITY): Payer: Self-pay | Admitting: Psychiatry

## 2019-11-12 DIAGNOSIS — F3181 Bipolar II disorder: Secondary | ICD-10-CM

## 2019-11-15 ENCOUNTER — Other Ambulatory Visit (INDEPENDENT_AMBULATORY_CARE_PROVIDER_SITE_OTHER): Payer: Self-pay | Admitting: Family Medicine

## 2019-11-15 DIAGNOSIS — R609 Edema, unspecified: Secondary | ICD-10-CM

## 2019-11-19 ENCOUNTER — Ambulatory Visit (INDEPENDENT_AMBULATORY_CARE_PROVIDER_SITE_OTHER): Payer: Medicare Other | Admitting: Psychiatry

## 2019-11-19 ENCOUNTER — Other Ambulatory Visit: Payer: Self-pay

## 2019-11-19 DIAGNOSIS — F419 Anxiety disorder, unspecified: Secondary | ICD-10-CM | POA: Diagnosis not present

## 2019-11-19 DIAGNOSIS — F3181 Bipolar II disorder: Secondary | ICD-10-CM | POA: Diagnosis not present

## 2019-11-19 NOTE — Progress Notes (Signed)
Virtual Visit via Telephone Note  I connected with [name] on 11/19/19 at  1:30 PM EDT by telephone and verified that I am speaking with the correct person using two identifiers.  Location: Patient: Patient Home Provider: Home Office   I discussed the limitations of evaluation and management by telemedicine and the availability of in person appointments. The patient expressed understanding and agreed to proceed.  History of Present Illness: Bipolar DO and Anxiety   Treatment Plan Goals: 1) [name] would like to improve boundary setting and communication skills with significant other to more clearly define the relationship and reduce negative impacts on mental health. 2) [name] would like to improve self esteem through learning and applying CBT skills to address cognitive distortions.   Observations/Objective: Counselor met with Client for individual therapy via Webex. Counselor assessed MH symptoms with patient reporting anxiety through restlessness and racing thoughts, and depressive symptoms such as isolating, guilt, shame, and hopelessness. Client presents with moderate depression and moderate anxiety. Client denied suicidal ideation or self-harm behaviors.   Client shared that her significant other has been moving in and out of her home since our last session in February due to inability to follow financial agreements and acts of infidelity. Counselor processed the impact of this on the client, her daily functioning and her desires for the relationship. Client noted that medications are helping with self-regulation and daily functioning, however she is dealing with low self-esteem and loneliness. Counselor and client reviewed and updated CCA and created new goals, based on current symptoms and reporting (see above). Client would like to meet with clinician on an EOW basis to address goals. Client to consider group treatment as well. Client compliant with medication  management.  Assessment and Plan: Counselor will continue to meet with patient to address treatment plan goals. Patient will continue to follow recommendations of providers and implement skills learned in session.  Follow Up Instructions: Counselor will send information for next session via Webex.    The patient was advised to call back or seek an in-person evaluation if the symptoms worsen or if the condition fails to improve as anticipated.  I provided 55 minutes of non-face-to-face time during this encounter.   Lise Auer, LCSW

## 2019-11-21 ENCOUNTER — Other Ambulatory Visit (INDEPENDENT_AMBULATORY_CARE_PROVIDER_SITE_OTHER): Payer: Self-pay | Admitting: Family Medicine

## 2019-11-21 ENCOUNTER — Ambulatory Visit (INDEPENDENT_AMBULATORY_CARE_PROVIDER_SITE_OTHER): Payer: Medicare Other | Admitting: Family Medicine

## 2019-11-21 ENCOUNTER — Other Ambulatory Visit (HOSPITAL_COMMUNITY): Payer: Self-pay | Admitting: Psychiatry

## 2019-11-21 ENCOUNTER — Encounter (INDEPENDENT_AMBULATORY_CARE_PROVIDER_SITE_OTHER): Payer: Self-pay | Admitting: Family Medicine

## 2019-11-21 ENCOUNTER — Other Ambulatory Visit: Payer: Self-pay

## 2019-11-21 VITALS — BP 115/81 | HR 95 | Temp 98.4°F | Ht 65.0 in | Wt 242.0 lb

## 2019-11-21 DIAGNOSIS — E559 Vitamin D deficiency, unspecified: Secondary | ICD-10-CM

## 2019-11-21 DIAGNOSIS — E8881 Metabolic syndrome: Secondary | ICD-10-CM

## 2019-11-21 DIAGNOSIS — R7303 Prediabetes: Secondary | ICD-10-CM

## 2019-11-21 DIAGNOSIS — F411 Generalized anxiety disorder: Secondary | ICD-10-CM

## 2019-11-21 DIAGNOSIS — Z6841 Body Mass Index (BMI) 40.0 and over, adult: Secondary | ICD-10-CM

## 2019-11-21 DIAGNOSIS — F4001 Agoraphobia with panic disorder: Secondary | ICD-10-CM

## 2019-11-21 DIAGNOSIS — R600 Localized edema: Secondary | ICD-10-CM | POA: Diagnosis not present

## 2019-11-21 DIAGNOSIS — E66813 Obesity, class 3: Secondary | ICD-10-CM

## 2019-11-21 DIAGNOSIS — F333 Major depressive disorder, recurrent, severe with psychotic symptoms: Secondary | ICD-10-CM

## 2019-11-21 DIAGNOSIS — G8929 Other chronic pain: Secondary | ICD-10-CM

## 2019-11-22 ENCOUNTER — Other Ambulatory Visit (INDEPENDENT_AMBULATORY_CARE_PROVIDER_SITE_OTHER): Payer: Self-pay | Admitting: Family Medicine

## 2019-11-22 DIAGNOSIS — G8929 Other chronic pain: Secondary | ICD-10-CM

## 2019-11-22 MED ORDER — VITAMIN D (ERGOCALCIFEROL) 1.25 MG (50000 UNIT) PO CAPS: 50000.0000 [IU] | ORAL_CAPSULE | ORAL | 0 refills | Status: DC

## 2019-11-22 MED ORDER — HYDROCHLOROTHIAZIDE 12.5 MG PO CAPS: 12.5000 mg | ORAL_CAPSULE | Freq: Every day | ORAL | 0 refills | Status: DC | PRN

## 2019-11-22 MED ORDER — TRULICITY 1.5 MG/0.5ML ~~LOC~~ SOAJ: 1.5000 mg | SUBCUTANEOUS | 0 refills | Status: DC

## 2019-11-22 NOTE — Progress Notes (Signed)
Chief Complaint:   OBESITY Juliane is here to discuss her progress with her obesity treatment plan along with follow-up of her obesity related diagnoses. Guilianna is on keeping a food journal and adhering to recommended goals of 1500 calories and 75 grams of protein and states she is following her eating plan approximately 50% of the time. Mellie states she is walking for 30 minutes 2 times per week.  Today's visit was #: 13 Starting weight: 250 lbs Starting date: 03/27/2019 Today's weight: 242 lbs Today's date: 11/21/2019 Total lbs lost to date: 8 lbs Total lbs lost since last in-office visit: 0  Interim History: Orit says she will see Pain Management on July 6 ([facility]).  She will have IC at her next visit.  Subjective:   1. Prediabetes, metabolic syndrome Deeana has a diagnosis of prediabetes based on her elevated HgA1c and was informed this puts her at greater risk of developing diabetes. She continues to work on diet and exercise to decrease her risk of diabetes. She denies nausea or hypoglycemia.  Risk factors (3 or more constitute Metabolic Syndrome): waist > 35" for female impaired fasting blood sugar on blood pressure medication.  Lab Results  Component Value Date   HGBA1C 5.5 06/27/2019   Lab Results  Component Value Date   INSULIN 7.9 03/27/2019   2. Lower extremity edema Ruba has edema of the lower extremities and takes HCTZ.  3. Vitamin D deficiency Luther's Vitamin D level was 20.1 on 06/27/2019. She is currently taking prescription vitamin D 50,000 IU each week. She denies nausea, vomiting or muscle weakness.  Assessment/Plan:   1. Prediabetes, metabolic syndrome Justus will continue to work on weight loss, exercise, and decreasing simple carbohydrates to help decrease the risk of diabetes.  Tyleah will stop metformin and increase her Trulicity, as per below.  Orders - Dulaglutide (TRULICITY) 1.5 MG/0.5ML SOPN; Inject 0.5 mLs (1.5 mg  total) into the skin once a week.  Dispense: 4 pen; Refill: 0  2. Lower extremity edema Will refill HCTZ today.  Continue to monitor.  Orders - hydrochlorothiazide (MICROZIDE) 12.5 MG capsule; Take 1 capsule (12.5 mg total) by mouth daily as needed (edema).  Dispense: 30 capsule; Refill: 0  3. Vitamin D deficiency Low Vitamin D level contributes to fatigue and are associated with obesity, breast, and colon cancer. She agrees to continue to take prescription Vitamin D @50 ,000 IU every week and will follow-up for routine testing of Vitamin D, at least 2-3 times per year to avoid over-replacement.  Orders - Vitamin D, Ergocalciferol, (DRISDOL) 1.25 MG (50000 UNIT) CAPS capsule; Take 1 capsule (50,000 Units total) by mouth every 7 (seven) days.  Dispense: 4 capsule; Refill: 0  4. Class 3 severe obesity with serious comorbidity and body mass index (BMI) of 40.0 to 44.9 in adult, unspecified obesity type [facility]) Demara is currently in the action stage of change. As such, her goal is to continue with weight loss efforts. She has agreed to keeping a food journal and adhering to recommended goals of 1500 calories and 75 grams of protein.   Exercise goals: For substantial health benefits, adults should do at least 150 minutes (2 hours and 30 minutes) a week of moderate-intensity, or 75 minutes (1 hour and 15 minutes) a week of vigorous-intensity aerobic physical activity, or an equivalent combination of moderate- and vigorous-intensity aerobic activity. Aerobic activity should be performed in episodes of at least 10 minutes, and preferably, it should be spread throughout the week.  Behavioral modification strategies: increasing lean protein intake and increasing water intake.  Caterina has agreed to follow-up with our clinic in 3 weeks.  She will have IC at that time.  She was informed of the importance of frequent follow-up visits to maximize her success with intensive lifestyle modifications for her  multiple health conditions.   Objective:   Blood pressure 115/81, pulse 95, temperature 98.4 F (36.9 C), temperature source Oral, height 5\' 5"  (1.651 m), weight 242 lb (109.8 kg), SpO2 100 %. Body mass index is 40.27 kg/m.  General: Cooperative, alert, well developed, in no acute distress. HEENT: Conjunctivae and lids unremarkable. Cardiovascular: Regular rhythm.  Lungs: Normal work of breathing. Neurologic: No focal deficits.   Lab Results  Component Value Date   CREATININE 0.80 11/09/2019   BUN 21 11/09/2019   NA 147 (H) 11/09/2019   K 4.6 11/09/2019   CL 102 11/09/2019   CO2 27 11/09/2019   Lab Results  Component Value Date   ALT 12 11/09/2019   AST 17 11/09/2019   ALKPHOS 73 11/09/2019   BILITOT <0.2 11/09/2019   Lab Results  Component Value Date   HGBA1C 5.5 06/27/2019   HGBA1C 5.9 (H) 03/27/2019   Lab Results  Component Value Date   INSULIN 7.9 03/27/2019   Lab Results  Component Value Date   TSH 3.620 06/27/2019   Lab Results  Component Value Date   CHOL 187 03/27/2019   HDL 101 03/27/2019   LDLCALC 68 03/27/2019   TRIG 102 03/27/2019   Lab Results  Component Value Date   WBC 7.9 11/09/2019   HGB 13.2 11/09/2019   HCT 39.2 11/09/2019   MCV 87 11/09/2019   PLT 285 11/09/2019   Obesity Behavioral Intervention:   Approximately 15 minutes were spent on the discussion below.  ASK: We discussed the diagnosis of obesity with Kwanza today and Teirra agreed to give [name] permission to discuss obesity behavioral modification therapy today.  ASSESS: Gary has the diagnosis of obesity and her BMI today is 40.3. Bree is in the action stage of change.   ADVISE: Skyanne was educated on the multiple health risks of obesity as well as the benefit of weight loss to improve her health. She was advised of the need for long term treatment and the importance of lifestyle modifications to improve her current health and to decrease her risk of future health  problems.  AGREE: Multiple dietary modification options and treatment options were discussed and Elfie agreed to follow the recommendations documented in the above note.  ARRANGE: Chestine was educated on the importance of frequent visits to treat obesity as outlined per CMS and USPSTF guidelines and agreed to schedule her next follow up appointment today.  Attestation Statements:   Reviewed by clinician on day of visit: allergies, medications, problem list, medical history, surgical history, family history, social history, and previous encounter notes.  I, [name], CMA, am acting as transcriptionist for Insurance claims handler, DO  I have reviewed the above documentation for accuracy and completeness, and I agree with the above. Helane Rima, DO

## 2019-11-27 ENCOUNTER — Encounter (HOSPITAL_COMMUNITY): Payer: Self-pay | Admitting: Psychiatry

## 2019-11-28 ENCOUNTER — Other Ambulatory Visit (INDEPENDENT_AMBULATORY_CARE_PROVIDER_SITE_OTHER): Payer: Self-pay | Admitting: Family Medicine

## 2019-11-28 DIAGNOSIS — G8929 Other chronic pain: Secondary | ICD-10-CM

## 2019-12-01 ENCOUNTER — Other Ambulatory Visit (HOSPITAL_COMMUNITY): Payer: Self-pay | Admitting: Psychiatry

## 2019-12-10 ENCOUNTER — Ambulatory Visit (HOSPITAL_COMMUNITY): Payer: Medicare Other | Admitting: Psychiatry

## 2019-12-12 ENCOUNTER — Emergency Department (HOSPITAL_COMMUNITY)
Admission: EM | Admit: 2019-12-12 | Discharge: 2019-12-12 | Disposition: A | Discharge status: Home / Self Care | Payer: Medicare Other | Attending: Emergency Medicine | Admitting: Emergency Medicine

## 2019-12-12 ENCOUNTER — Emergency Department (HOSPITAL_COMMUNITY): Payer: Medicare Other

## 2019-12-12 ENCOUNTER — Encounter (HOSPITAL_COMMUNITY): Payer: Self-pay | Admitting: Emergency Medicine

## 2019-12-12 ENCOUNTER — Other Ambulatory Visit: Payer: Self-pay

## 2019-12-12 DIAGNOSIS — Z5321 Procedure and treatment not carried out due to patient leaving prior to being seen by health care provider: Secondary | ICD-10-CM | POA: Diagnosis not present

## 2019-12-12 DIAGNOSIS — R410 Disorientation, unspecified: Secondary | ICD-10-CM | POA: Diagnosis not present

## 2019-12-12 DIAGNOSIS — R42 Dizziness and giddiness: Secondary | ICD-10-CM | POA: Diagnosis not present

## 2019-12-12 DIAGNOSIS — R4781 Slurred speech: Secondary | ICD-10-CM | POA: Diagnosis not present

## 2019-12-12 DIAGNOSIS — R519 Headache, unspecified: Secondary | ICD-10-CM | POA: Diagnosis present

## 2019-12-12 DIAGNOSIS — M79602 Pain in left arm: Secondary | ICD-10-CM | POA: Insufficient documentation

## 2019-12-12 LAB — URINALYSIS, ROUTINE W REFLEX MICROSCOPIC
Bilirubin Urine: NEGATIVE
Glucose, UA: NEGATIVE mg/dL
Hgb urine dipstick: NEGATIVE
Ketones, ur: NEGATIVE mg/dL
Leukocytes,Ua: NEGATIVE
Nitrite: NEGATIVE
Protein, ur: NEGATIVE mg/dL
Specific Gravity, Urine: 1.031 — ABNORMAL HIGH (ref 1.005–1.030)
pH: 6 (ref 5.0–8.0)

## 2019-12-12 LAB — CBC
HCT: 40.9 % (ref 36.0–46.0)
Hemoglobin: 12.9 g/dL (ref 12.0–15.0)
MCH: 29.1 pg (ref 26.0–34.0)
MCHC: 31.5 g/dL (ref 30.0–36.0)
MCV: 92.1 fL (ref 80.0–100.0)
Platelets: 318 10*3/uL (ref 150–400)
RBC: 4.44 MIL/uL (ref 3.87–5.11)
RDW: 14.1 % (ref 11.5–15.5)
WBC: 6.7 10*3/uL (ref 4.0–10.5)
nRBC: 0 % (ref 0.0–0.2)

## 2019-12-12 LAB — BASIC METABOLIC PANEL
Anion gap: 12 (ref 5–15)
BUN: 17 mg/dL (ref 6–20)
CO2: 30 mmol/L (ref 22–32)
Calcium: 9.5 mg/dL (ref 8.9–10.3)
Chloride: 99 mmol/L (ref 98–111)
Creatinine, Ser: 0.78 mg/dL (ref 0.44–1.00)
GFR calc Af Amer: >60 mL/min (ref >60)
GFR calc non Af Amer: >60 mL/min (ref >60)
Glucose, Bld: 98 mg/dL (ref 70–99)
Potassium: 4.1 mmol/L (ref 3.5–5.1)
Sodium: 141 mmol/L (ref 135–145)

## 2019-12-12 LAB — I-STAT BETA HCG BLOOD, ED (MC, WL, AP ONLY): I-stat hCG, quantitative: <5 m[IU]/mL (ref <5)

## 2019-12-12 NOTE — ED Triage Notes (Signed)
Patient c/o headache with dizziness and left arm pain x2 days. Reports slurred speech and confusion. No facial droop noted. Unequal grip and arm drift present on left side.

## 2019-12-12 NOTE — ED Notes (Signed)
Patient turned in labels and reports she is leaving. 

## 2019-12-13 ENCOUNTER — Encounter (HOSPITAL_COMMUNITY): Payer: Self-pay | Admitting: Psychiatry

## 2019-12-13 ENCOUNTER — Encounter (INDEPENDENT_AMBULATORY_CARE_PROVIDER_SITE_OTHER): Payer: Self-pay

## 2019-12-13 ENCOUNTER — Ambulatory Visit (INDEPENDENT_AMBULATORY_CARE_PROVIDER_SITE_OTHER): Payer: Medicare Other | Admitting: Psychiatry

## 2019-12-13 ENCOUNTER — Other Ambulatory Visit (INDEPENDENT_AMBULATORY_CARE_PROVIDER_SITE_OTHER): Payer: Self-pay | Admitting: Family Medicine

## 2019-12-13 DIAGNOSIS — F419 Anxiety disorder, unspecified: Secondary | ICD-10-CM

## 2019-12-13 DIAGNOSIS — F3181 Bipolar II disorder: Secondary | ICD-10-CM | POA: Diagnosis not present

## 2019-12-13 DIAGNOSIS — E559 Vitamin D deficiency, unspecified: Secondary | ICD-10-CM

## 2019-12-13 NOTE — Telephone Encounter (Signed)
My chart message sent to pt.

## 2019-12-13 NOTE — Progress Notes (Signed)
Virtual Visit via Video Note  I connected with [name] on 12/13/19 at  3:30 PM EDT by a video enabled telemedicine application and verified that I am speaking with the correct person using two identifiers.  Location: Patient: Patient Home Provider: Home Office   I discussed the limitations of evaluation and management by telemedicine and the availability of in person appointments. The patient expressed understanding and agreed to proceed.  History of Present Illness: Bipolar DO and Anxiety  Treatment Plan Goals: 1) Ceonna would like to improve boundary setting and communication skills with significant other to more clearly define the relationship and reduce negative impacts on mental health. 2) Kristalyn would like to improve self esteem through learning and applying CBT skills to address cognitive distortions.   Observations/Objective: Counselor met with Client for individual therapy via Webex. Counselor assessed MH symptoms and progress on treatment plan goals, with patient reporting that she was currently unable to have an open therapy session, as she was in the presence of her former partner. Counselor ensured physical safety of Client. Client confirmed that she was safe and for a portion of the session she was able to meet in private. Client discussed progress on on goal one with agreeing to attend a formal event with him, laying out her expectations, parameters and desires for the event. Counselor praised Client for her assertive and clear communication skills. Counselor brainstormed ideas with Client on how to respond if boundaries were pushed or not followed. Client able to engaged in processing in preparation, prioritizing her mental health. Client noted engaging in more positive self-talk and self-care practices, when goal 2 was assessed. Client looking forward to pampering self for formal event.  Client presents with moderate depression and moderate anxiety. Client denied  suicidal ideation or self-harm behaviors.   Assessment and Plan: Counselor will continue to meet with patient to address treatment plan goals. Patient will continue to follow recommendations of providers and implement skills learned in session.  Follow Up Instructions: Counselor will send information for next session via Webex.    The patient was advised to call back or seek an in-person evaluation if the symptoms worsen or if the condition fails to improve as anticipated.  I provided 30 minutes of non-face-to-face time during this encounter.   Lise Auer, LCSW

## 2019-12-14 ENCOUNTER — Other Ambulatory Visit (INDEPENDENT_AMBULATORY_CARE_PROVIDER_SITE_OTHER): Payer: Self-pay | Admitting: Family Medicine

## 2019-12-14 DIAGNOSIS — R7303 Prediabetes: Secondary | ICD-10-CM

## 2019-12-15 ENCOUNTER — Other Ambulatory Visit (INDEPENDENT_AMBULATORY_CARE_PROVIDER_SITE_OTHER): Payer: Self-pay | Admitting: Family Medicine

## 2019-12-15 DIAGNOSIS — R600 Localized edema: Secondary | ICD-10-CM

## 2019-12-17 ENCOUNTER — Encounter (INDEPENDENT_AMBULATORY_CARE_PROVIDER_SITE_OTHER): Payer: Self-pay

## 2019-12-17 ENCOUNTER — Ambulatory Visit (HOSPITAL_COMMUNITY): Payer: Medicare Other | Admitting: Psychiatry

## 2019-12-18 ENCOUNTER — Other Ambulatory Visit: Payer: Self-pay

## 2019-12-18 ENCOUNTER — Ambulatory Visit (INDEPENDENT_AMBULATORY_CARE_PROVIDER_SITE_OTHER): Payer: Medicare Other | Admitting: Psychiatry

## 2019-12-18 ENCOUNTER — Encounter (HOSPITAL_COMMUNITY): Payer: Self-pay | Admitting: Psychiatry

## 2019-12-18 DIAGNOSIS — F3181 Bipolar II disorder: Secondary | ICD-10-CM

## 2019-12-18 IMAGING — MR MR HEAD WO/W CM
17 of 40 series · 23 of 48 positions shown · IV contrast (Yes)
Comparison: Brain MRI 11/09/2017

CLINICAL DATA: Lower extremity weakness.

EXAM:
MRI HEAD WITHOUT AND WITH CONTRAST
TECHNIQUE: Multiplanar, multiecho pulse sequences of the brain and surrounding
structures were obtained without and with intravenous contrast.
CONTRAST:  10 mL Gadavist

[Series 3: DWI · axial · 3.0mm · 1.09mm/px · z∈[-81,+63]mm · 2 of 98 slices shown (1 of 4)]
[im 1/98]
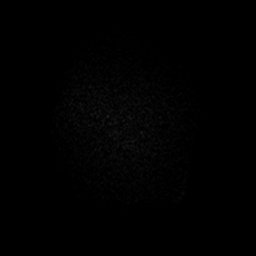
[im 98/98]
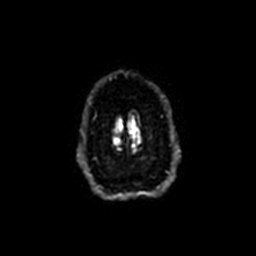

[Series 5: DWI · coronal · 3.0mm · 1.09mm/px · 2 of 98 slices shown (2 of 4)]
[im 1/98]
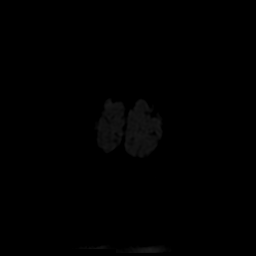
[im 98/98]
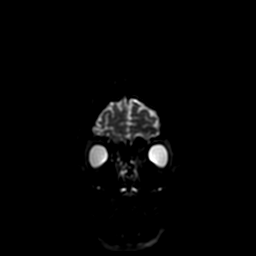

[Series 6: T2 · axial · 5.0mm · 0.43mm/px · 1 of 28 slices shown (1 of 3)]
[im 1/28]
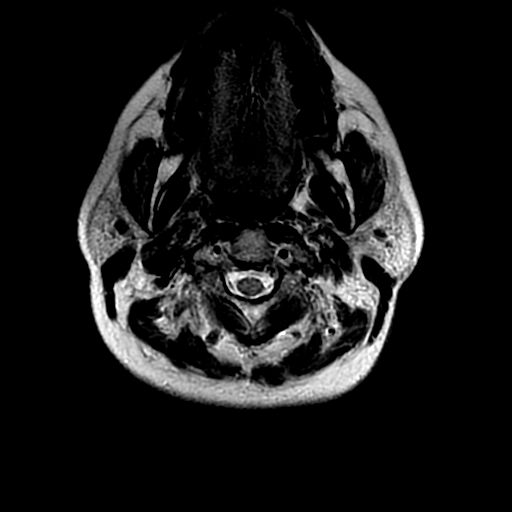

[Series 7: FLAIR · axial · 5.0mm · 0.43mm/px · 1 of 28 slices shown (1 of 2)]
[im 1/28]
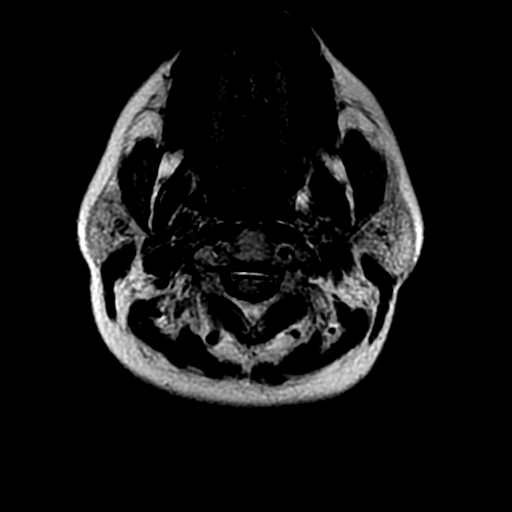

[Series 9: FLAIR · sagittal · 1.6mm · 0.47mm/px · 5 of 176 slices shown (2 of 2)]
[im 1/176]
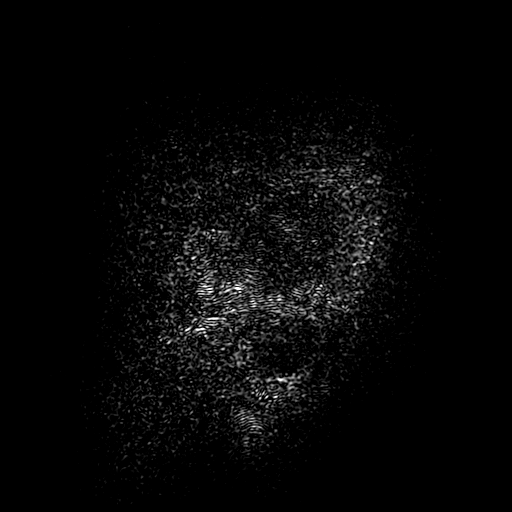
[im 44/176]
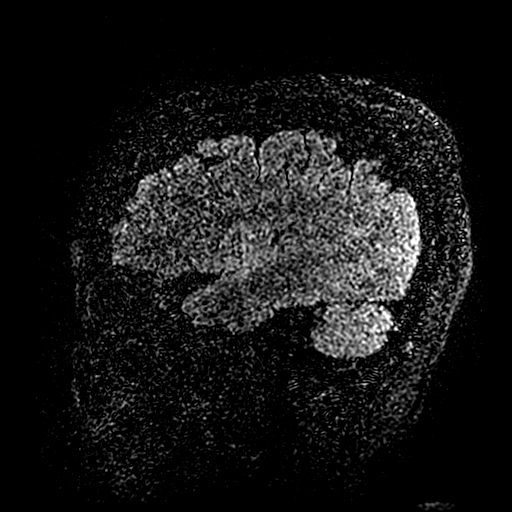
[im 88/176]
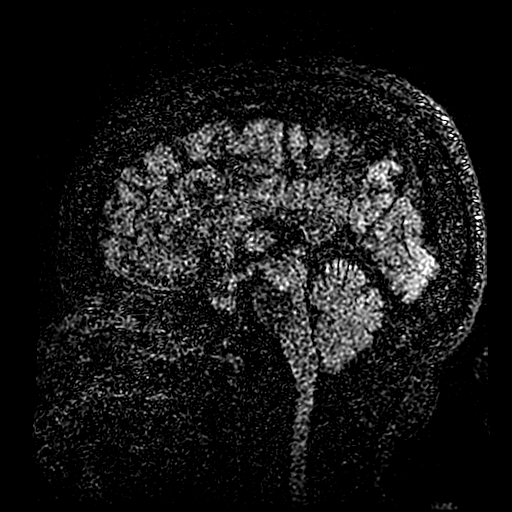
[im 132/176]
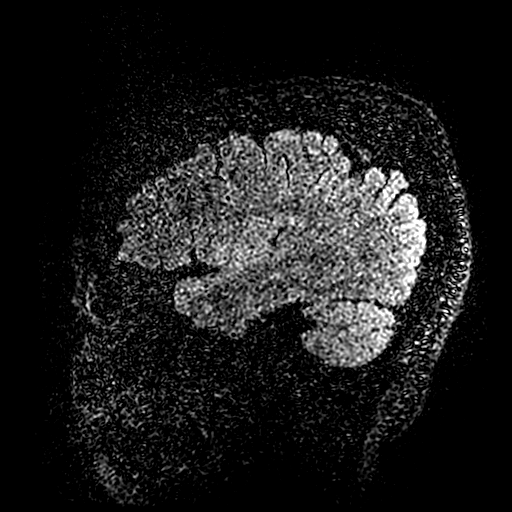
[im 176/176]
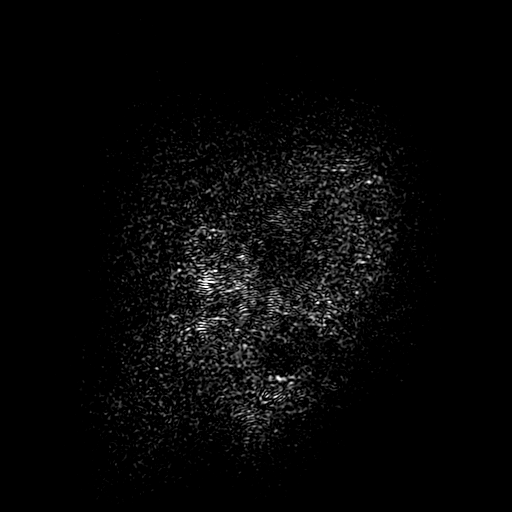

[Series 11: T2 · coronal · 5.0mm · 0.47mm/px · 1 of 24 slices shown (2 of 3)]
[im 1/24]
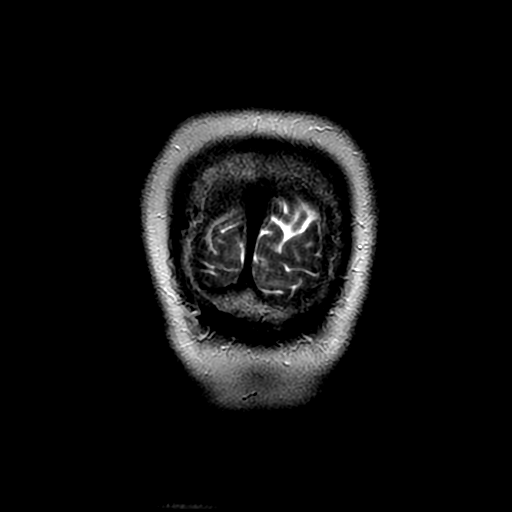

[Series 15: T2 post-contrast · sagittal · 3.0mm · 0.41mm/px · 1 of 14 slices shown]
[im 1/14]
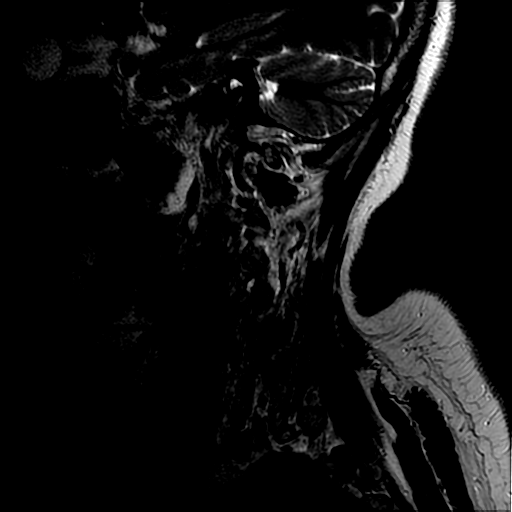

[Series 17: T2 · axial · 3.1mm · 0.35mm/px · 1 of 27 slices shown (3 of 3)]
[im 1/27]
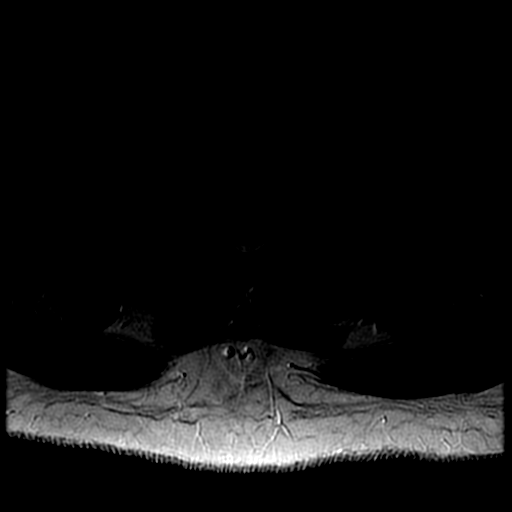

[Series 35: T1 post-contrast · sagittal · 4.0mm · 0.51mm/px · 1 of 14 slices shown (1 of 7)]
[im 1/14]
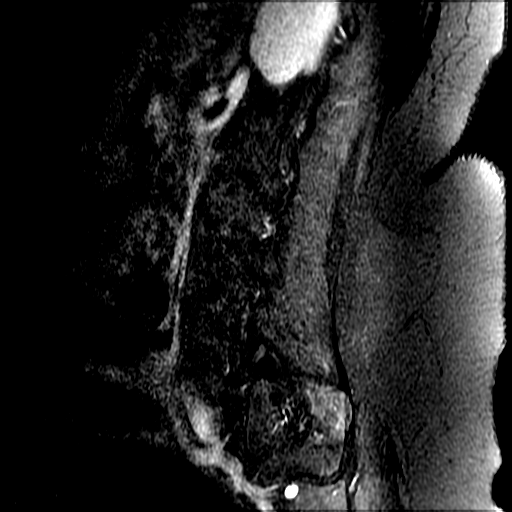

[Series 36: T1 post-contrast · axial · 4.0mm · 0.41mm/px · 1 of 34 slices shown (2 of 7)]
[im 1/34]
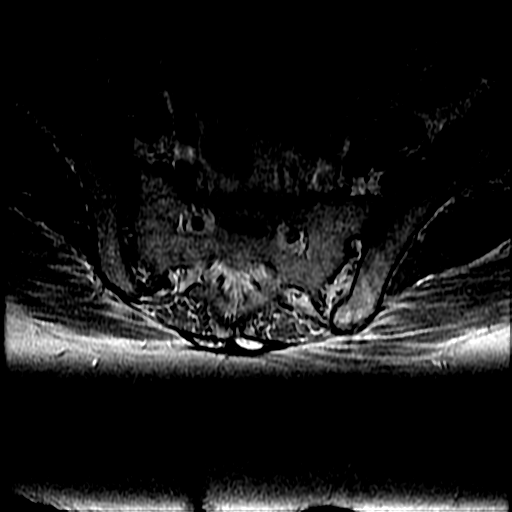

[Series 37: T1 post-contrast · sagittal · 3.0mm · 0.62mm/px · 1 of 14 slices shown (3 of 7)]
[im 1/14]
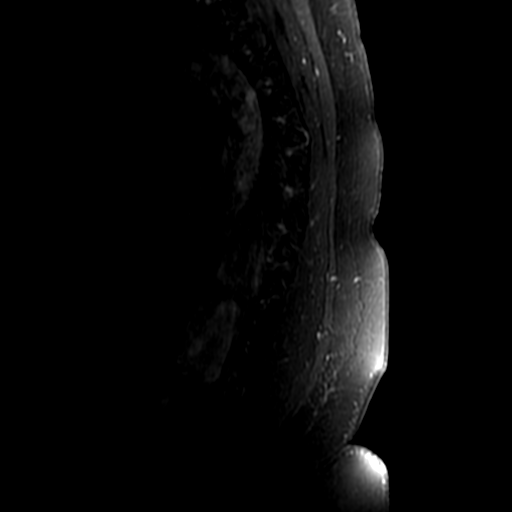

[Series 38: T1 post-contrast · axial · 4.0mm · 0.41mm/px · 1 of 46 slices shown (4 of 7)]
[im 1/46]
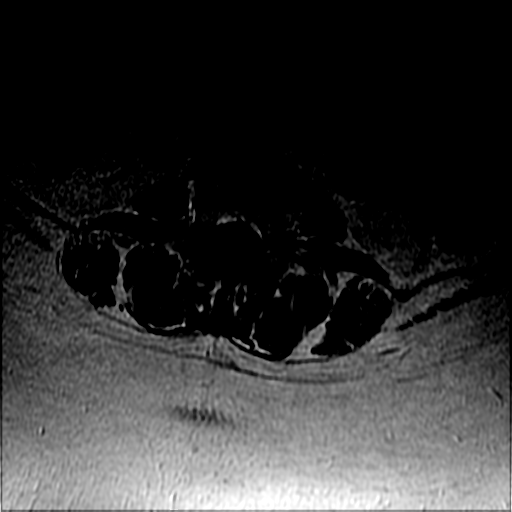

[Series 40: T1 post-contrast · axial · 3.1mm · 0.35mm/px · 1 of 29 slices shown (5 of 7)]
[im 1/29]
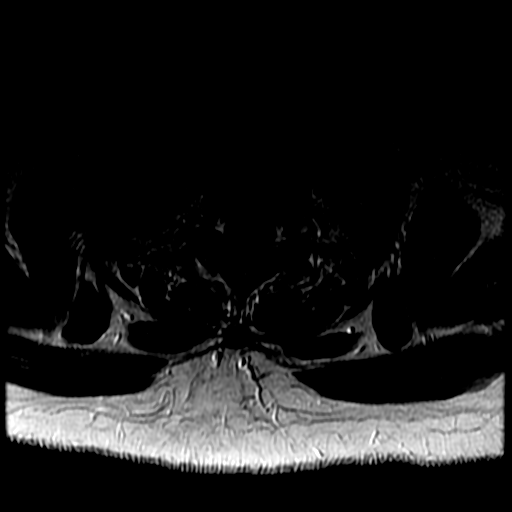

[Series 42: T1 post-contrast · coronal · 5.0mm · 0.47mm/px · 1 of 24 slices shown (6 of 7)]
[im 1/24]
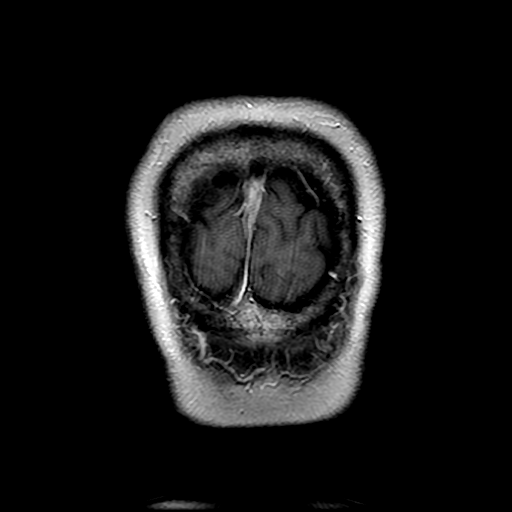

[Series 43: T1 post-contrast · sagittal · 5.0mm · 0.47mm/px · 1 of 24 slices shown (7 of 7)]
[im 1/24]
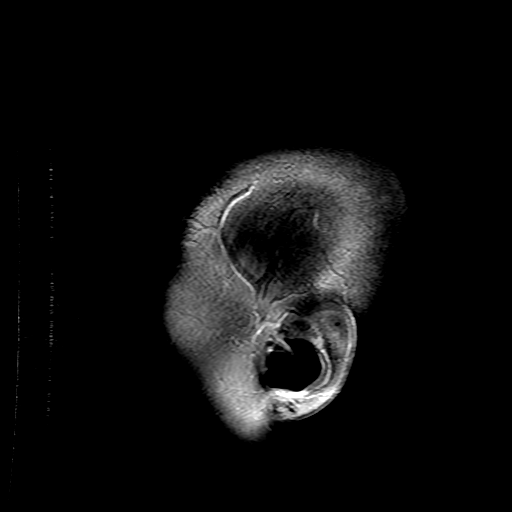

[Series 301: DWI · axial · 3.0mm · 1.09mm/px · 1 of 49 slices shown (3 of 4)]
[im 1/49]
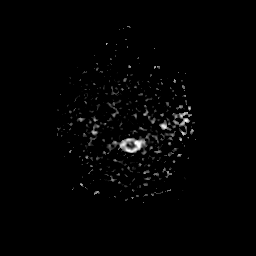

[Series 500: DWI · coronal · 3.0mm · 1.09mm/px · 1 of 49 slices shown (4 of 4)]
[im 1/49]
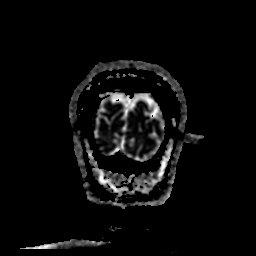

[23 of 48 positions shown; findings below may reference images not displayed]

FINDINGS: BRAIN: There is no acute infarct, acute hemorrhage, hydrocephalus or
extra-axial collection. The midline structures are normal. No
midline shift or other mass effect. There are no old infarcts. There
are a few clustered foci of hyperintense T2-weighted signal in the
left frontal subcortical white matter and right periatrial white
matter, but this is a nonspecific finding and commonly seen in
normal patients in this age range. On the prior scan, the apparent
greater number of lesions was probably due partially to pulsation
artifacts. The cerebral and cerebellar volume are age-appropriate.
Susceptibility-sensitive sequences show no chronic microhemorrhage
or superficial siderosis.

VASCULAR: Major intracranial arterial and venous sinus flow voids
are normal.

SKULL AND UPPER CERVICAL SPINE: Calvarial bone marrow signal is
normal. There is no skull base mass. Visualized upper cervical spine
and soft tissues are normal.

SINUSES/ORBITS: No fluid levels or advanced mucosal thickening. No
mastoid or middle ear effusion. The orbits are normal.
IMPRESSION: Normal aging brain.

## 2019-12-18 NOTE — Progress Notes (Addendum)
Virtual Visit via Video Note  I connected with [name] on 12/18/19 at  2:30 PM EDT by a video enabled telemedicine application and verified that I am speaking with the correct person using two identifiers.  Location: Patient: Patient Home Provider: Home Office   I discussed the limitations of evaluation and management by telemedicine and the availability of in person appointments. The patient expressed understanding and agreed to proceed.  History of Present Illness: Bipolar DO and Anxiety  Treatment Plan Goals: 1) [name] would like to improve boundary setting and communication skills with significant other to more clearly define the relationship and reduce negative impacts on mental health. 2) [name] would like to improve self esteem through learning and applying CBT skills to address cognitive distortions.   Observations/Objective: Counselor met with Client for individual therapy via Webex. Counselor assessed MH symptoms and progress on treatment plan goals, with patient reporting that she was preparing for meeting with a funeral service, as her best friend and best friends daughter passed in the past two weeks. Client was audibly and visibly upset, crying, panting, attempting to calm self. Counselor prompted client to do a self-assessment to identify needs and to calm/ground self. Counselor offered condolences and empathetic listening. Counselor encouraged healthy grieving and psychoeducation on the grieving process. Counselor encouraged Client to communicate needs and limitations with those around her and to connect with her support system. Client noted that she recently took an as needed anxiety medication to calm self. She stated that her son was present to support her as well. Client presents with severe depression and severe anxiety. Client denied suicidal ideation or self-harm behaviors.   Assessment and Plan: Counselor will continue to meet with patient to address treatment  plan goals. Patient will continue to follow recommendations of providers and implement skills learned in session.  Follow Up Instructions: Counselor will send information for next session via Webex.    The patient was advised to call back or seek an in-person evaluation if the symptoms worsen or if the condition fails to improve as anticipated.  I provided 20 minutes of non-face-to-face time during this encounter.   Lise Auer, LCSW

## 2019-12-19 ENCOUNTER — Ambulatory Visit (INDEPENDENT_AMBULATORY_CARE_PROVIDER_SITE_OTHER): Payer: Medicare Other | Admitting: Family Medicine

## 2019-12-20 ENCOUNTER — Other Ambulatory Visit: Payer: Self-pay

## 2019-12-20 ENCOUNTER — Encounter (INDEPENDENT_AMBULATORY_CARE_PROVIDER_SITE_OTHER): Payer: Self-pay | Admitting: Family Medicine

## 2019-12-20 ENCOUNTER — Encounter (INDEPENDENT_AMBULATORY_CARE_PROVIDER_SITE_OTHER): Payer: Self-pay

## 2019-12-20 ENCOUNTER — Ambulatory Visit (INDEPENDENT_AMBULATORY_CARE_PROVIDER_SITE_OTHER): Payer: Medicare Other | Admitting: Family Medicine

## 2019-12-20 ENCOUNTER — Telehealth (HOSPITAL_COMMUNITY): Payer: Medicare Other | Admitting: Psychiatry

## 2019-12-20 VITALS — BP 114/83 | HR 79 | Temp 97.9°F | Ht 65.0 in | Wt 240.0 lb

## 2019-12-20 DIAGNOSIS — J45909 Unspecified asthma, uncomplicated: Secondary | ICD-10-CM

## 2019-12-20 DIAGNOSIS — R7303 Prediabetes: Secondary | ICD-10-CM | POA: Diagnosis not present

## 2019-12-20 DIAGNOSIS — Z9884 Bariatric surgery status: Secondary | ICD-10-CM

## 2019-12-20 DIAGNOSIS — E66813 Obesity, class 3: Secondary | ICD-10-CM

## 2019-12-20 DIAGNOSIS — K9089 Other intestinal malabsorption: Secondary | ICD-10-CM

## 2019-12-20 DIAGNOSIS — G4700 Insomnia, unspecified: Secondary | ICD-10-CM

## 2019-12-20 DIAGNOSIS — F419 Anxiety disorder, unspecified: Secondary | ICD-10-CM

## 2019-12-20 DIAGNOSIS — E559 Vitamin D deficiency, unspecified: Secondary | ICD-10-CM

## 2019-12-20 DIAGNOSIS — E039 Hypothyroidism, unspecified: Secondary | ICD-10-CM

## 2019-12-20 DIAGNOSIS — E038 Other specified hypothyroidism: Secondary | ICD-10-CM

## 2019-12-20 DIAGNOSIS — Z6841 Body Mass Index (BMI) 40.0 and over, adult: Secondary | ICD-10-CM | POA: Diagnosis not present

## 2019-12-20 DIAGNOSIS — F3181 Bipolar II disorder: Secondary | ICD-10-CM

## 2019-12-20 MED ORDER — MONTELUKAST SODIUM 10 MG PO TABS: 10.0000 mg | ORAL_TABLET | Freq: Every day | ORAL | 2 refills | Status: DC

## 2019-12-20 MED ORDER — TRULICITY 1.5 MG/0.5ML ~~LOC~~ SOAJ: 1.5000 mg | SUBCUTANEOUS | 0 refills | Status: DC

## 2019-12-20 MED ORDER — ALBUTEROL SULFATE HFA 108 (90 BASE) MCG/ACT IN AERS: 2.0000 | INHALATION_SPRAY | Freq: Four times a day (QID) | RESPIRATORY_TRACT | 2 refills | Status: DC | PRN

## 2019-12-20 MED ORDER — DIVALPROEX SODIUM ER 250 MG PO TB24: 500.0000 mg | ORAL_TABLET | Freq: Two times a day (BID) | ORAL | 1 refills | Status: DC

## 2019-12-20 MED ORDER — CLONAZEPAM 0.5 MG PO TABS: 0.5000 mg | ORAL_TABLET | Freq: Three times a day (TID) | ORAL | 1 refills | Status: DC | PRN

## 2019-12-20 MED ORDER — PRAZOSIN HCL 1 MG PO CAPS: 1.0000 mg | ORAL_CAPSULE | Freq: Every day | ORAL | 1 refills | Status: DC

## 2019-12-20 MED ORDER — DOXEPIN HCL 75 MG PO CAPS: 75.0000 mg | ORAL_CAPSULE | Freq: Every day | ORAL | 1 refills | Status: DC

## 2019-12-20 NOTE — Progress Notes (Unsigned)
Virtual Visit via Telephone Note  I connected with [name] on 12/20/19 at  3:45 PM EDT by telephone and verified that I am speaking with the correct person using two identifiers.  Location: Patient: home Provider: office   I discussed the limitations, risks, security and privacy concerns of performing an evaluation and management service by telephone and the availability of in person appointments. I also discussed with the patient that there may be a patient responsible charge related to this service. The patient expressed understanding and agreed to proceed.   History of Present Illness: [name] shares of number of stressors that are making her feel tired. She is depressed and has come to realize that she needs to spend time focusing on her. [name] is not sleeping well even with Doxepin. It helps her stay asleep for longer periods of of time. She sometimes forgets her midday dose of Neurontin and will end up taking 4 tabs and will then sleep for 8+ hrs. It feels very restful. Her anxiety is high and she is unable to relax.  [name] has been having increased panic attacks. She thought at one point she was having a heart attack and went to the ED. She manic and hypomanic like symptoms. She denies SI/HI.    Observations/Objective:  General Appearance: unable to assess  Eye Contact:  unable to assess  Speech:  {Speech:22685}  Volume:  {Volume (PAA):22686}  Mood:  {BHH MOOD:22306}  Affect:  {Affect (PAA):22687}  Thought Process:  {Thought Process (PAA):22688}  Orientation:  {BHH ORIENTATION (PAA):22689}  Thought Content:  {Thought Content:22690}  Suicidal Thoughts:  {ST/HT (PAA):22692}  Homicidal Thoughts:  {ST/HT (PAA):22692}  Memory:  {BHH MEMORY:22881}  Judgement:  {Judgement (PAA):22694}  Insight:  {Insight (PAA):22695}  Psychomotor Activity: unable to assess  Concentration:  {Concentration:21399}  Recall:  {BHH GOOD/FAIR/POOR:22877}  Fund of Knowledge:  {BHH  GOOD/FAIR/POOR:22877}  Language:  {BHH GOOD/FAIR/POOR:22877}  Akathisia:  unable to assess  Handed:  {Handed:22697}  AIMS (if indicated):     Assets:  {Assets (PAA):22698}  ADL's:  unable to assess  Cognition:  {chl bhh cognition:304700322}  Sleep:         Assessment and Plan:  Bipolar 2 d/o- current episode depressed; Insomnia; GAD; Panic disorder without agoraphobia; Social anxiety d/o  Depakote ER 500mg  BID  Increase Doxepin 75mg  po qHS  Prazosin 1mg  po qHS  Klonopin 0.5mg  po TID prn anxiety  Reviewed labs 12/12/19 BMP wnl, CBC wnl,    Follow Up Instructions: In 4-8 weeks or sooner if needed   I discussed the assessment and treatment plan with the patient. The patient was provided an opportunity to ask questions and all were answered. The patient agreed with the plan and demonstrated an understanding of the instructions.   The patient was advised to call back or seek an in-person evaluation if the symptoms worsen or if the condition fails to improve as anticipated.  I provided 15 minutes of non-face-to-face time during this encounter.   , MD

## 2019-12-20 NOTE — Progress Notes (Signed)
Chief Complaint:   OBESITY Danyele is here to discuss her progress with her obesity treatment plan along with follow-up of her obesity related diagnoses. Aroura is on keeping a food journal and adhering to recommended goals of 1500 calories and 75 grams of protein daily and states she is following her eating plan approximately 30% of the time. Merlie states she is doing 0 minutes 0 times per week.  Today's visit was #: 14 Starting weight: 250 lbs Starting date: 03/27/2019 Today's weight: 240 lbs Today's date: 12/20/2019 Total lbs lost to date: 10 Total lbs lost since last in-office visit: 2  Interim History: Nyhla's RMR has decreased from 2063 on 03/27/2019 to 1825 with IC done today. She tends to skip meals, sometimes 2 out of 3 per day. She journals about 4 days per week, and she averages about 900 calories per day. She says she is not hungry but she does tend to snack.   Subjective:   1. Asthma, unspecified asthma severity, unspecified whether complicated, unspecified whether persistent Bayyinah tends to have more asthma exacerbations with pollen in the air.  2. S/P gastric bypass Zanyah does not follow up with [facility], but she would like to re-establish care.  3. Other specified intestinal malabsorption Malu does not take multivitamins. She had gastric bypass in 2008.  4. Pre-diabetes Diona has a diagnosis of pre-diabetes based on her elevated Hgb A1c and was informed this puts her at greater risk of developing diabetes. She is on Trulicity and she denies nausea, but notes constipation. She is on Movantik for constipation. She continues to work on diet and exercise to decrease her risk of diabetes. She denies nausea or hypoglycemia.  Lab Results  Component Value Date   HGBA1C 5.5 06/27/2019   Lab Results  Component Value Date   INSULIN 7.9 03/27/2019   5. Vitamin D deficiency Rupal's last Vit D level was low at 20.1. She is on weekly  prescription Vit D.  6. Subclinical hypothyroidism Abbygale's last TSH was within normal limits. Current symptoms: none.   Lab Results  Component Value Date   TSH 3.620 06/27/2019   Assessment/Plan:   1. Asthma, unspecified asthma severity, unspecified whether complicated, unspecified whether persistent IC was done today, and we will refill albuterol and Singulair for 1 month with 2 refills. Lanissa will continue to follow up as directed.  - montelukast (SINGULAIR) 10 MG tablet; Take 1 tablet (10 mg total) by mouth at bedtime.  Dispense: 30 tablet; Refill: 2 - albuterol (VENTOLIN HFA) 108 (90 Base) MCG/ACT inhaler; Inhale 2 puffs into the lungs every 6 (six) hours as needed for wheezing.  Dispense: 18 g; Refill: 2  2. S/P gastric bypass We will refer to [facility] for a follow up for gastric bypass surgery. Ariellah will follow up as directed.  3. Other specified intestinal malabsorption We will check labs today. Phoenicia is to take one multivitamin BID, and will follow up as directed.  - Comprehensive metabolic panel - CBC with Differential/Platelet - Vitamin B12 - Anemia panel - Vitamin B1 - Vitamin B6 - Prealbumin  4. Pre-diabetes Merrily will continue to work on weight loss, exercise, and decreasing simple carbohydrates to help decrease the risk of diabetes. We will check labs today, and we will refill Trulicity for 1 month.  - Hemoglobin A1c - Insulin, random - Dulaglutide (TRULICITY) 1.5 MG/0.5ML SOPN; Inject 0.5 mLs (1.5 mg total) into the skin once a week.  Dispense: 4 pen; Refill: 0  5. Vitamin D deficiency Low Vitamin D level contributes to fatigue and are associated with obesity, breast, and colon cancer. We will check labs today. Amit agreed to continue taking prescription Vitamin D 50,000 IU every week and will follow-up for routine testing of Vitamin D, at least 2-3 times per year to avoid over-replacement.  - VITAMIN D 25 Hydroxy (Vit-D  Deficiency, Fractures)  6. Subclinical hypothyroidism We will check labs today. Shaconna will continue to follow up as directed.  - T4, free - TSH  7. Class 3 severe obesity with serious comorbidity and body mass index (BMI) of 40.0 to 44.9 in adult, unspecified obesity type [facility]) Karelyn is currently in the action stage of change. As such, her goal is to continue with weight loss efforts. She has agreed to keeping a food journal and adhering to recommended goals of 1100-1300 calories and 75 grams of protein daily.   Exercise goals: No exercise has been prescribed at this time.  Behavioral modification strategies: increasing lean protein intake and no skipping meals.  Juan has agreed to follow-up with our clinic in 2 to 3 weeks. She was informed of the importance of frequent follow-up visits to maximize her success with intensive lifestyle modifications for her multiple health conditions.   Daziya was informed we would discuss her lab results at her next visit unless there is a critical issue that needs to be addressed sooner. Rocky agreed to keep her next visit at the agreed upon time to discuss these results.  Objective:   Blood pressure 114/83, pulse 79, temperature 97.9 F (36.6 C), temperature source Oral, height 5\' 5"  (1.651 m), weight (!) 240 lb (108.9 kg), SpO2 92 %. Body mass index is 39.94 kg/m.  General: Cooperative, alert, well developed, in no acute distress. HEENT: Conjunctivae and lids unremarkable. Cardiovascular: Regular rhythm.  Lungs: Normal work of breathing. Neurologic: No focal deficits.   Lab Results  Component Value Date   CREATININE 0.78 12/12/2019   BUN 17 12/12/2019   NA 141 12/12/2019   K 4.1 12/12/2019   CL 99 12/12/2019   CO2 30 12/12/2019   Lab Results  Component Value Date   ALT 12 11/09/2019   AST 17 11/09/2019   ALKPHOS 73 11/09/2019   BILITOT <0.2 11/09/2019   Lab Results  Component Value Date   HGBA1C 5.5 06/27/2019   HGBA1C  5.9 (H) 03/27/2019   Lab Results  Component Value Date   INSULIN 7.9 03/27/2019   Lab Results  Component Value Date   TSH 3.620 06/27/2019   Lab Results  Component Value Date   CHOL 187 03/27/2019   HDL 101 03/27/2019   LDLCALC 68 03/27/2019   TRIG 102 03/27/2019   Lab Results  Component Value Date   WBC 6.7 12/12/2019   HGB 12.9 12/12/2019   HCT 40.9 12/12/2019   MCV 92.1 12/12/2019   PLT 318 12/12/2019   No results found for: IRON, TIBC, FERRITIN  Obesity Behavioral Intervention Documentation for Insurance:   Approximately 15 minutes were spent on the discussion below.  ASK: We discussed the diagnosis of obesity with Anabia today and Viera agreed to give [name] permission to discuss obesity behavioral modification therapy today.  ASSESS: Greydis has the diagnosis of obesity and her BMI today is 39.94. Tanaya is in the action stage of change.   ADVISE: Uldine was educated on the multiple health risks of obesity as well as the benefit of weight loss to improve her health. She was advised of the  need for long term treatment and the importance of lifestyle modifications to improve her current health and to decrease her risk of future health problems.  AGREE: Multiple dietary modification options and treatment options were discussed and Neola agreed to follow the recommendations documented in the above note.  ARRANGE: Eulogia was educated on the importance of frequent visits to treat obesity as outlined per CMS and USPSTF guidelines and agreed to schedule her next follow up appointment today.  Attestation Statements:   Reviewed by clinician on day of visit: allergies, medications, problem list, medical history, surgical history, family history, social history, and previous encounter notes.   Trude Mcburney, am acting as Energy manager for [place], FNP-C.  I have reviewed the above documentation for accuracy and completeness, and I agree with the above. -   Jesse Sans, FNP

## 2019-12-23 ENCOUNTER — Other Ambulatory Visit (HOSPITAL_COMMUNITY): Payer: Self-pay | Admitting: Psychiatry

## 2019-12-23 DIAGNOSIS — F3181 Bipolar II disorder: Secondary | ICD-10-CM

## 2019-12-24 ENCOUNTER — Ambulatory Visit (HOSPITAL_COMMUNITY): Payer: Medicare Other | Admitting: Psychiatry

## 2019-12-25 ENCOUNTER — Ambulatory Visit (HOSPITAL_COMMUNITY): Payer: Medicare Other | Admitting: Psychiatry

## 2019-12-25 LAB — ANEMIA PANEL
Ferritin: 50 ng/mL (ref 15–150)
Folate, Hemolysate: 486 ng/mL
Folate, RBC: 940 ng/mL (ref >498)
Hematocrit: 51.7 % — ABNORMAL HIGH (ref 34.0–46.6)
Iron Saturation: 27 % (ref 15–55)
Iron: 114 ug/dL (ref 27–159)
Retic Ct Pct: 1.8 % (ref 0.6–2.6)
Total Iron Binding Capacity: 424 ug/dL (ref 250–450)
UIBC: 310 ug/dL (ref 131–425)
Vitamin B-12: 1590 pg/mL — ABNORMAL HIGH (ref 232–1245)

## 2019-12-25 LAB — CBC WITH DIFFERENTIAL/PLATELET
Basophils Absolute: 0.1 10*3/uL (ref 0.0–0.2)
Basos: 1 %
EOS (ABSOLUTE): 0.1 10*3/uL (ref 0.0–0.4)
Eos: 1 %
Hemoglobin: 17 g/dL — ABNORMAL HIGH (ref 11.1–15.9)
Immature Grans (Abs): 0 10*3/uL (ref 0.0–0.1)
Immature Granulocytes: 0 %
Lymphocytes Absolute: 2.5 10*3/uL (ref 0.7–3.1)
Lymphs: 27 %
MCH: 29.3 pg (ref 26.6–33.0)
MCHC: 32.9 g/dL (ref 31.5–35.7)
MCV: 89 fL (ref 79–97)
Monocytes Absolute: 1 10*3/uL — ABNORMAL HIGH (ref 0.1–0.9)
Monocytes: 11 %
Neutrophils Absolute: 5.8 10*3/uL (ref 1.4–7.0)
Neutrophils: 60 %
Platelets: 314 10*3/uL (ref 150–450)
RBC: 5.8 x10E6/uL — ABNORMAL HIGH (ref 3.77–5.28)
RDW: 14 % (ref 11.7–15.4)
WBC: 9.5 10*3/uL (ref 3.4–10.8)

## 2019-12-25 LAB — COMPREHENSIVE METABOLIC PANEL
ALT: 12 IU/L (ref 0–32)
AST: 16 IU/L (ref 0–40)
Albumin/Globulin Ratio: 1.5 (ref 1.2–2.2)
Albumin: 4.8 g/dL (ref 3.8–4.9)
Alkaline Phosphatase: 76 IU/L (ref 48–121)
BUN/Creatinine Ratio: 27 — ABNORMAL HIGH (ref 9–23)
BUN: 22 mg/dL (ref 6–24)
Bilirubin Total: 0.2 mg/dL (ref 0.0–1.2)
CO2: 27 mmol/L (ref 20–29)
Calcium: 9.4 mg/dL (ref 8.7–10.2)
Chloride: 98 mmol/L (ref 96–106)
Creatinine, Ser: 0.81 mg/dL (ref 0.57–1.00)
GFR calc Af Amer: 94 mL/min/{1.73_m2} (ref >59)
GFR calc non Af Amer: 81 mL/min/{1.73_m2} (ref >59)
Globulin, Total: 3.2 g/dL (ref 1.5–4.5)
Glucose: 77 mg/dL (ref 65–99)
Potassium: 4.5 mmol/L (ref 3.5–5.2)
Sodium: 141 mmol/L (ref 134–144)
Total Protein: 8 g/dL (ref 6.0–8.5)

## 2019-12-25 LAB — T4, FREE: Free T4: 0.95 ng/dL (ref 0.82–1.77)

## 2019-12-25 LAB — PREALBUMIN: PREALBUMIN: 35 mg/dL (ref 10–36)

## 2019-12-25 LAB — HEMOGLOBIN A1C
Est. average glucose Bld gHb Est-mCnc: 114 mg/dL
Hgb A1c MFr Bld: 5.6 % (ref 4.8–5.6)

## 2019-12-25 LAB — VITAMIN B1: Thiamine: 199.5 nmol/L (ref 66.5–200.0)

## 2019-12-25 LAB — T3: T3, Total: 135 ng/dL (ref 71–180)

## 2019-12-25 LAB — TSH: TSH: 5.96 u[IU]/mL — ABNORMAL HIGH (ref 0.450–4.500)

## 2019-12-25 LAB — VITAMIN D 25 HYDROXY (VIT D DEFICIENCY, FRACTURES): Vit D, 25-Hydroxy: 22.5 ng/mL — ABNORMAL LOW (ref 30.0–100.0)

## 2019-12-25 LAB — INSULIN, RANDOM: INSULIN: 8.7 u[IU]/mL (ref 2.6–24.9)

## 2019-12-30 ENCOUNTER — Other Ambulatory Visit (INDEPENDENT_AMBULATORY_CARE_PROVIDER_SITE_OTHER): Payer: Self-pay | Admitting: Family Medicine

## 2019-12-30 DIAGNOSIS — E559 Vitamin D deficiency, unspecified: Secondary | ICD-10-CM

## 2020-01-02 ENCOUNTER — Other Ambulatory Visit (HOSPITAL_COMMUNITY): Payer: Self-pay | Admitting: Psychiatry

## 2020-01-07 ENCOUNTER — Ambulatory Visit (HOSPITAL_COMMUNITY): Payer: Medicare Other | Admitting: Psychiatry

## 2020-01-08 ENCOUNTER — Other Ambulatory Visit: Payer: Self-pay

## 2020-01-08 ENCOUNTER — Encounter (HOSPITAL_COMMUNITY): Payer: Self-pay | Admitting: Psychiatry

## 2020-01-08 ENCOUNTER — Ambulatory Visit (INDEPENDENT_AMBULATORY_CARE_PROVIDER_SITE_OTHER): Payer: Medicare Other | Admitting: Psychiatry

## 2020-01-08 DIAGNOSIS — F419 Anxiety disorder, unspecified: Secondary | ICD-10-CM | POA: Diagnosis not present

## 2020-01-08 NOTE — Progress Notes (Signed)
Virtual Visit via Video Note  I connected with [name] on 01/08/20 at  3:30 PM EDT by a video enabled telemedicine application and verified that I am speaking with the correct person using two identifiers.  Location: Patient: Patient Home Provider: Home Office   I discussed the limitations of evaluation and management by telemedicine and the availability of in person appointments. The patient expressed understanding and agreed to proceed.  History of Present Illness: Bipolar DO and Anxiety  Treatment Plan Goals: 1)[name] would like to improve boundary setting and communication skills with significant other to more clearly define the relationship and reduce negative impacts on mental health. 2)[name] would like to improve self esteem through learning and applying CBT skills to address cognitive distortions.  Observations/Objective: Counselor met with Client for individual therapy via Webex. Counselor assessed MH symptoms and progress on treatment plan goals, with patient reporting that she is currently feeling overwhelmed and frustrated with the negligence as housemates her Son and former partner are as of late. Client reports that she is directly and clearly communicating with them, without any positive adjustment or changes in their behavior. Counselor and Client discussed similar challenges experienced in the past. Counselor implemented MI interventions to assess client's motivation to change and address things differently to produce more healthy outcomes. Client able to identify patterns in behaviors. Client finds it difficult to demand her worth and expectations due to codependency and enmeshment issues. Client presents with moderate depression and moderate anxiety. Client denied suicidal ideation or self-harm behaviors.   Counselor prompted Client to share about coping abilities in relation to our last sessions content of loosing her best friend and best friends daughter.  Client stated a variety of skills and strategies used to make it through the funeral services and to start the grieving process. Counselor praised and acknowledged her intention work in this area. Client reports medication management is improving and that sleep have improved.   Assessment and Plan: Counselor will continue to meet with patient to address treatment plan goals. Patient will continue to follow recommendations of providers and implement skills learned in session.  Follow Up Instructions: Counselor will send information for next session via Webex.    The patient was advised to call back or seek an in-person evaluation if the symptoms worsen or if the condition fails to improve as anticipated.  I provided 30 minutes of non-face-to-face time during this encounter.    , LCSW  

## 2020-01-10 ENCOUNTER — Encounter (INDEPENDENT_AMBULATORY_CARE_PROVIDER_SITE_OTHER): Payer: Self-pay | Admitting: Family Medicine

## 2020-01-10 ENCOUNTER — Other Ambulatory Visit: Payer: Self-pay

## 2020-01-10 ENCOUNTER — Telehealth: Payer: Self-pay | Admitting: Neurology

## 2020-01-10 ENCOUNTER — Ambulatory Visit (INDEPENDENT_AMBULATORY_CARE_PROVIDER_SITE_OTHER): Payer: Medicare Other | Admitting: Family Medicine

## 2020-01-10 VITALS — BP 114/74 | HR 94 | Temp 98.4°F | Ht 65.0 in | Wt 245.0 lb

## 2020-01-10 DIAGNOSIS — Z6841 Body Mass Index (BMI) 40.0 and over, adult: Secondary | ICD-10-CM | POA: Diagnosis not present

## 2020-01-10 DIAGNOSIS — E8881 Metabolic syndrome: Secondary | ICD-10-CM

## 2020-01-10 DIAGNOSIS — Z9884 Bariatric surgery status: Secondary | ICD-10-CM

## 2020-01-10 DIAGNOSIS — E038 Other specified hypothyroidism: Secondary | ICD-10-CM

## 2020-01-10 DIAGNOSIS — E039 Hypothyroidism, unspecified: Secondary | ICD-10-CM

## 2020-01-10 DIAGNOSIS — E559 Vitamin D deficiency, unspecified: Secondary | ICD-10-CM | POA: Diagnosis not present

## 2020-01-10 DIAGNOSIS — M5432 Sciatica, left side: Secondary | ICD-10-CM | POA: Diagnosis not present

## 2020-01-10 DIAGNOSIS — R600 Localized edema: Secondary | ICD-10-CM | POA: Diagnosis not present

## 2020-01-10 NOTE — Telephone Encounter (Signed)
Patient has a Botox injection on 8/24. I called [employer] and spoke with Josh to see if codes [phone] and 24401 require PA. He states they are valid and billable and do not require PA. I asked him what specialty pharmacy patient could use. He states patient will be B/B. Reference #[id number].

## 2020-01-11 ENCOUNTER — Other Ambulatory Visit (HOSPITAL_COMMUNITY): Payer: Self-pay | Admitting: Psychiatry

## 2020-01-11 MED ORDER — HYDROCHLOROTHIAZIDE 12.5 MG PO CAPS: 12.5000 mg | ORAL_CAPSULE | Freq: Every day | ORAL | 0 refills | Status: DC | PRN

## 2020-01-11 MED ORDER — TRULICITY 1.5 MG/0.5ML ~~LOC~~ SOAJ: 1.5000 mg | SUBCUTANEOUS | 0 refills | Status: DC

## 2020-01-14 ENCOUNTER — Other Ambulatory Visit: Payer: Self-pay | Admitting: Pain Medicine

## 2020-01-14 ENCOUNTER — Ambulatory Visit (HOSPITAL_COMMUNITY): Payer: Medicare Other | Admitting: Psychiatry

## 2020-01-14 DIAGNOSIS — M79605 Pain in left leg: Secondary | ICD-10-CM

## 2020-01-14 DIAGNOSIS — M545 Low back pain, unspecified: Secondary | ICD-10-CM

## 2020-01-14 NOTE — Progress Notes (Signed)
Chief Complaint:   OBESITY [name] is here to discuss her progress with her obesity treatment plan along with follow-up of her obesity related diagnoses. [name] is on keeping a food journal and adhering to recommended goals of 1100-1300 calories and 75 grams of protein and states she is following her eating plan approximately 0% of the time. [name] states she is exercising for 0 minutes 0 times per week.  Today's visit was #: 15 Starting weight: 250 lbs Starting date: 03/27/2019 Today's weight: 245 lbs Today's date: 01/10/2020 Total lbs lost to date: 5 lbs Total lbs lost since last in-office visit: 0  Interim History: Today's bioimpedance results indicate that [name] has gained 5.5 pounds of water weight since her last visit (she is out of her HCTZ).  Subjective:   1. Vitamin D deficiency [name]'s Vitamin D level was 22.5 on 12/20/2019. She is currently taking prescription vitamin D 50,000 IU each week.   2. Subclinical hypothyroidism  Lab Results  Component Value Date   TSH 5.960 (H) 12/20/2019   3. Sciatica of left side [name] had ESI one month ago and repeat ESI yesterday.  She says this did not help.  [name]  5. Metabolic syndrome Risk factors (3 or more constitute Metabolic Syndrome): waist > 35" for female impaired fasting blood sugar on blood pressure medication.  6. History of bariatric [name] [name] had bariatric [name] with [name].  She will see them tomorrow.  Assessment/Plan:   1. Vitamin D deficiency Not at goal. Low Vitamin D level contributes to fatigue and are associated with obesity, breast, and colon cancer. She agrees to continue to take prescription Vitamin D @50 ,000 IU every week and will follow-up for routine testing of Vitamin D, at least 2-3 times per year to avoid  over-replacement.  2. Subclinical hypothyroidism Will continue to monitor.  3. Sciatica of left side Will continue to monitor.  4. Lower extremity edema Refill HCTZ, as per below.  Orders - hydrochlorothiazide (MICROZIDE) 12.5 MG capsule; Take 1 capsule (12.5 mg total) by mouth daily as needed (edema).  Dispense: 30 capsule; Refill: 0  5. Metabolic syndrome Goal: Lose 7-10% of starting weight. Counseling: Intensive lifestyle modifications are the first line treatment for this issue. We discussed several lifestyle modifications today and she will continue to work on diet, exercise and weight loss efforts.   Orders - Dulaglutide (TRULICITY) 1.5 MG/0.5ML SOPN; Inject 0.5 mLs (1.5 mg total) into the skin once a week.  Dispense: 2 mL; Refill: 0  6. History of bariatric [name] [name] is at risk for malnutrition due to her previous bariatric [name].   Counseling  You may need to eat 3 meals and 2 snacks, or 5 small meals each day in order to reach your protein and calorie goals.   Allow at least 15 minutes for each meal so that you can eat mindfully. Listen to your body so that you do not overeat. For most people, your sleeve or pouch will comfortably hold 4-6 ounces.  Eat foods from all food groups. This includes fruits and vegetables, grains, dairy, and meat and other proteins.  Include a protein-rich food at every meal and snack, and eat the protein food first.   You should be taking a Bariatric Multivitamin as well as calcium.   7. Class 3 severe obesity with serious comorbidity and  body mass index (BMI) of 40.0 to 44.9 in adult, unspecified obesity type [name]) [name] is currently in the action stage of change. As such, her goal is to continue with weight loss efforts. She has agreed to practicing portion control and making smarter food choices, such as increasing vegetables and decreasing simple carbohydrates.   Exercise goals: All adults should avoid inactivity. Some physical  activity is better than none, and adults who participate in any amount of physical activity gain some health benefits.  Behavioral modification strategies: increasing lean protein intake, increasing vegetables, increasing water intake and decreasing sodium intake.  [name] has agreed to follow-up with our clinic in 3 weeks. She was informed of the importance of frequent follow-up visits to maximize her success with intensive lifestyle modifications for her multiple health conditions.   Objective:   Blood pressure 114/74, pulse 94, temperature 98.4 F (36.9 C), temperature source Oral, height 5\' 5"  (1.651 m), weight 245 lb (111.1 kg), SpO2 98 %. Body mass index is 40.77 kg/m.  General: Cooperative, alert, well developed, in no acute distress. HEENT: Conjunctivae and lids unremarkable. Cardiovascular: Regular rhythm.  Lungs: Normal work of breathing. Neurologic: No focal deficits.   Lab Results  Component Value Date   CREATININE 0.81 12/20/2019   BUN 22 12/20/2019   NA 141 12/20/2019   K 4.5 12/20/2019   CL 98 12/20/2019   CO2 27 12/20/2019   Lab Results  Component Value Date   ALT 12 12/20/2019   AST 16 12/20/2019   ALKPHOS 76 12/20/2019   BILITOT 0.2 12/20/2019   Lab Results  Component Value Date   HGBA1C 5.6 12/20/2019   HGBA1C 5.5 06/27/2019   HGBA1C 5.9 (H) 03/27/2019   Lab Results  Component Value Date   INSULIN 8.7 12/20/2019   INSULIN 7.9 03/27/2019   Lab Results  Component Value Date   TSH 5.960 (H) 12/20/2019   Lab Results  Component Value Date   CHOL 187 03/27/2019   HDL 101 03/27/2019   LDLCALC 68 03/27/2019   TRIG 102 03/27/2019   Lab Results  Component Value Date   WBC 9.5 12/20/2019   HGB 17.0 (H) 12/20/2019   HCT 51.7 (H) 12/20/2019   MCV 89 12/20/2019   PLT 314 12/20/2019   Lab Results  Component Value Date   IRON 114 12/20/2019   TIBC 424 12/20/2019   FERRITIN 50 12/20/2019   Obesity Behavioral Intervention:   Approximately 15  minutes were spent on the discussion below.  ASK: We discussed the diagnosis of obesity with [name] today and [name] agreed to give [name] permission to discuss obesity behavioral modification therapy today.  ASSESS: Krisi has the diagnosis of obesity and her BMI today is 40.8. Baljit is in the action stage of change.   ADVISE: Gloria was educated on the multiple health risks of obesity as well as the benefit of weight loss to improve her health. She was advised of the need for long term treatment and the importance of lifestyle modifications to improve her current health and to decrease her risk of future health problems.  AGREE: Multiple dietary modification options and treatment options were discussed and Armoni agreed to follow the recommendations documented in the above note.  ARRANGE: Jaine was educated on the importance of frequent visits to treat obesity as outlined per CMS and USPSTF guidelines and agreed to schedule her next follow up appointment today.  Attestation Statements:   Reviewed by clinician on day of visit: allergies, medications, problem list, medical  history, surgical history, family history, social history, and previous encounter notes.  I, Water quality scientist, CMA, am acting as transcriptionist for Briscoe Deutscher, DO  I have reviewed the above documentation for accuracy and completeness, and I agree with the above. Briscoe Deutscher, DO

## 2020-01-14 NOTE — Progress Notes (Signed)
Consent Form Botulism Toxin Injection For Chronic Migraine  Interval history 01/15/2020: Doing great, More than 50% improvement in frequency but also in severity. Ajovy not approve will give emgality instead. she has failed imitrex and maxalt and cannot take anymore triptans, contraindicated due to side effects, gave her some samples. She feels clenching is a problem and she has pain when clenching in the temples as well. She had HTN on aimovig, stopped. She had botox in the past and we restarted on 03/2019, doing great. Loves emgality as an add on as well. No date set for her wedding yet, it has been 2.5 years. She is very anxious and took anxiety medication today and did better but she is very anxious. She wants an outdoor wedding. She did much better, she took her anxiety and pain meds. [facility]    Reviewed orally with patient, additionally signature is on file:  Botulism toxin has been approved by the Federal drug administration for treatment of chronic migraine. Botulism toxin does not cure chronic migraine and it may not be effective in some patients.  The administration of botulism toxin is accomplished by injecting a small amount of toxin into the muscles of the neck and head. Dosage must be titrated for each individual. Any benefits resulting from botulism toxin tend to wear off after 3 months with a repeat injection required if benefit is to be maintained. Injections are usually done every 3-4 months with maximum effect peak achieved by about 2 or 3 weeks. Botulism toxin is expensive and you should be sure of what costs you will incur resulting from the injection.  The side effects of botulism toxin use for chronic migraine may include:   -Transient, and usually mild, facial weakness with facial injections  -Transient, and usually mild, head or neck weakness with head/neck injections  -Reduction or loss of forehead facial animation due to  forehead muscle weakness  -Eyelid drooping  -Dry eye  -Pain at the site of injection or bruising at the site of injection  -Double vision  -Potential unknown long term risks  Contraindications: You should not have Botox if you are pregnant, nursing, allergic to albumin, have an infection, skin condition, or muscle weakness at the site of the injection, or have myasthenia gravis, Lambert-Eaton syndrome, or ALS.  It is also possible that as with any injection, there may be an allergic reaction or no effect from the medication. Reduced effectiveness after repeated injections is sometimes seen and rarely infection at the injection site may occur. All care will be taken to prevent these side effects. If therapy is given over a long time, atrophy and wasting in the muscle injected may occur. Occasionally the patient's become refractory to treatment because they develop antibodies to the toxin. In this event, therapy needs to be modified.  I have read the above information and consent to the administration of botulism toxin.    BOTOX PROCEDURE NOTE FOR MIGRAINE HEADACHE    Contraindications and precautions discussed with patient(above). Aseptic procedure was observed and patient tolerated procedure. Procedure performed by Dr. Artemio Aly  The condition has existed for more than 6 months, and pt does not have a diagnosis of ALS, Myasthenia Gravis or Lambert-Eaton Syndrome.  Risks and benefits of injections discussed and pt agrees to proceed with the procedure.  Written consent obtained  These injections are medically necessary. Pt  receives good benefits from these injections. These injections do not cause sedations  or hallucinations which the oral therapies may cause.  Description of procedure:  The patient was placed in a sitting position. The standard protocol was used for Botox as follows, with 5 units of Botox injected at each site:   -Procerus muscle, midline injection  -Corrugator  muscle, bilateral injection  -Frontalis muscle, bilateral injection, with 2 sites each side, medial injection was performed in the upper one third of the frontalis muscle, in the region vertical from the medial inferior edge of the superior orbital rim. The lateral injection was again in the upper one third of the forehead vertically above the lateral limbus of the cornea, 1.5 cm lateral to the medial injection site.  -Temporalis muscle injection, 4 sites, bilaterally. The first injection was 3 cm above the tragus of the ear, second injection site was 1.5 cm to 3 cm up from the first injection site in line with the tragus of the ear. The third injection site was 1.5-3 cm forward between the first 2 injection sites. The fourth injection site was 1.5 cm posterior to the second injection site.   -Occipitalis muscle injection, 3 sites, bilaterally. The first injection was done one half way between the occipital protuberance and the tip of the mastoid process behind the ear. The second injection site was done lateral and superior to the first, 1 fingerbreadth from the first injection. The third injection site was 1 fingerbreadth superiorly and medially from the first injection site.  -Cervical paraspinal muscle injection, 2 sites, bilateral knee first injection site was 1 cm from the midline of the cervical spine, 3 cm inferior to the lower border of the occipital protuberance. The second injection site was 1.5 cm superiorly and laterally to the first injection site.  -Trapezius muscle injection was performed at 3 sites, bilaterally. The first injection site was in the upper trapezius muscle halfway between the inflection point of the neck, and the acromion. The second injection site was one half way between the acromion and the first injection site. The third injection was done between the first injection site and the inflection point of the neck.   Will return for repeat injection in 3 months.   200  units of Botox was used, any Botox not injected was wasted. The patient tolerated the procedure well, there were no complications of the above procedure.

## 2020-01-15 ENCOUNTER — Other Ambulatory Visit: Payer: Self-pay

## 2020-01-15 ENCOUNTER — Ambulatory Visit (INDEPENDENT_AMBULATORY_CARE_PROVIDER_SITE_OTHER): Payer: Medicare Other | Admitting: Neurology

## 2020-01-15 DIAGNOSIS — G43711 Chronic migraine without aura, intractable, with status migrainosus: Secondary | ICD-10-CM

## 2020-01-15 NOTE — Progress Notes (Signed)
Botox- 200 units x 1 vial Lot: [id number] Expiration: 08/2022 NDC: 5631-4970-26  Bacteriostatic 0.9% Sodium Chloride- 64mL total Lot: [id number] Expiration: 02/21/2021 NDC: 5027-7412-87  Dx: O67.672 B/B

## 2020-01-17 ENCOUNTER — Other Ambulatory Visit (HOSPITAL_COMMUNITY): Payer: Self-pay | Admitting: Psychiatry

## 2020-01-17 ENCOUNTER — Other Ambulatory Visit: Payer: Self-pay | Admitting: General Surgery

## 2020-01-17 ENCOUNTER — Ambulatory Visit (HOSPITAL_COMMUNITY): Payer: Medicare Other | Admitting: Psychiatry

## 2020-01-17 DIAGNOSIS — Z9884 Bariatric surgery status: Secondary | ICD-10-CM

## 2020-01-17 DIAGNOSIS — F3181 Bipolar II disorder: Secondary | ICD-10-CM

## 2020-01-21 ENCOUNTER — Ambulatory Visit (HOSPITAL_COMMUNITY): Payer: Medicare Other | Admitting: Psychiatry

## 2020-01-21 ENCOUNTER — Ambulatory Visit (INDEPENDENT_AMBULATORY_CARE_PROVIDER_SITE_OTHER): Payer: Medicare Other | Admitting: Family Medicine

## 2020-01-22 ENCOUNTER — Other Ambulatory Visit: Payer: Self-pay | Admitting: General Surgery

## 2020-01-22 ENCOUNTER — Ambulatory Visit (HOSPITAL_COMMUNITY): Payer: Medicare Other | Admitting: Psychiatry

## 2020-01-22 ENCOUNTER — Ambulatory Visit
Admission: RE | Admit: 2020-01-22 | Discharge: 2020-01-22 | Disposition: A | Discharge status: Home / Self Care | Payer: Medicare Other | Source: Ambulatory Visit | Attending: General Surgery | Admitting: General Surgery

## 2020-01-22 DIAGNOSIS — Z9884 Bariatric surgery status: Secondary | ICD-10-CM

## 2020-01-29 ENCOUNTER — Other Ambulatory Visit (INDEPENDENT_AMBULATORY_CARE_PROVIDER_SITE_OTHER): Payer: Self-pay | Admitting: Family Medicine

## 2020-01-29 DIAGNOSIS — E8881 Metabolic syndrome: Secondary | ICD-10-CM

## 2020-01-31 ENCOUNTER — Other Ambulatory Visit: Payer: Self-pay

## 2020-01-31 ENCOUNTER — Telehealth (HOSPITAL_COMMUNITY): Payer: Medicare Other | Admitting: Psychiatry

## 2020-01-31 DIAGNOSIS — T43225A Adverse effect of selective serotonin reuptake inhibitors, initial encounter: Secondary | ICD-10-CM

## 2020-01-31 DIAGNOSIS — G4700 Insomnia, unspecified: Secondary | ICD-10-CM

## 2020-01-31 DIAGNOSIS — F3181 Bipolar II disorder: Secondary | ICD-10-CM

## 2020-01-31 DIAGNOSIS — F419 Anxiety disorder, unspecified: Secondary | ICD-10-CM

## 2020-02-02 ENCOUNTER — Other Ambulatory Visit (INDEPENDENT_AMBULATORY_CARE_PROVIDER_SITE_OTHER): Payer: Self-pay | Admitting: Family Medicine

## 2020-02-02 DIAGNOSIS — R600 Localized edema: Secondary | ICD-10-CM

## 2020-02-04 ENCOUNTER — Other Ambulatory Visit: Payer: Medicare Other

## 2020-02-04 ENCOUNTER — Telehealth (HOSPITAL_COMMUNITY): Payer: Self-pay | Admitting: *Deleted

## 2020-02-04 NOTE — Telephone Encounter (Signed)
Received message form patient stating that at your last visit you discussed adding a new medication for her.  Ichecked the visit notes and was unable to find any mention of this. Please review and clarify.

## 2020-02-05 ENCOUNTER — Other Ambulatory Visit: Payer: Self-pay

## 2020-02-05 ENCOUNTER — Encounter (INDEPENDENT_AMBULATORY_CARE_PROVIDER_SITE_OTHER): Payer: Self-pay | Admitting: Family Medicine

## 2020-02-05 ENCOUNTER — Ambulatory Visit (INDEPENDENT_AMBULATORY_CARE_PROVIDER_SITE_OTHER): Payer: Medicare Other | Admitting: Family Medicine

## 2020-02-05 VITALS — BP 111/77 | HR 78 | Temp 98.1°F | Ht 65.0 in | Wt 241.0 lb

## 2020-02-05 DIAGNOSIS — E8881 Metabolic syndrome: Secondary | ICD-10-CM

## 2020-02-05 DIAGNOSIS — E559 Vitamin D deficiency, unspecified: Secondary | ICD-10-CM

## 2020-02-05 DIAGNOSIS — E66813 Obesity, class 3: Secondary | ICD-10-CM

## 2020-02-05 DIAGNOSIS — Z6841 Body Mass Index (BMI) 40.0 and over, adult: Secondary | ICD-10-CM | POA: Diagnosis not present

## 2020-02-05 DIAGNOSIS — G8929 Other chronic pain: Secondary | ICD-10-CM | POA: Diagnosis not present

## 2020-02-05 MED ORDER — TRULICITY 1.5 MG/0.5ML ~~LOC~~ SOAJ: 1.5000 mg | SUBCUTANEOUS | 0 refills | Status: DC

## 2020-02-07 ENCOUNTER — Telehealth (HOSPITAL_COMMUNITY): Payer: Self-pay | Admitting: *Deleted

## 2020-02-07 NOTE — Telephone Encounter (Signed)
Pt called for second time stating that you two discussed a new medication on her last visit on 01/31/20. Also pt did not get refills on any meds. Please review and advise.

## 2020-02-07 NOTE — Progress Notes (Signed)
Chief Complaint:   OBESITY [name] is here to discuss her progress with her obesity treatment plan along with follow-up of her obesity related diagnoses. [name] is on keeping a food journal and adhering to recommended goals of 1100-1300 calories and 75 grams of protein and states she is following her eating plan approximately 0% of the time. [name] states she is exercising for 0 minutes 0 times per week.  Today's visit was #: 16 Starting weight: 250 lbs Starting date: 03/27/2019 Today's weight: 241 lbs Today's date: 02/05/2020 Total lbs lost to date: 9 lbs Total lbs lost since last in-office visit: 4 lbs Total weight loss percentage to date: -3.60%  Interim History: [name] says that Trulicity is helpful in decreasing polyphagia and cravings. She is having increased fibromyalgia pain, but is still trying to be active. She saw [facility] last month, but we do not have those records.  We will request them.  Assessment/Plan:   1. Other chronic pain [name] suffers from fibromyalgia and OA. We discussed several lifestyle modifications today and she will continue to work on diet, exercise and weight loss efforts.Will continue to monitor symptoms as they relate to her weight loss journey. This issue directly impacts care plan for optimization of BMI and metabolic health as it impacts the patient's ability to make lifestyle changes. Try DeathPrevention.it, which is a series of self-care modules designed to teach patients several techniques to manage pain.   2. Vitamin D deficiency Current vitamin D is 22.5, tested on 12/20/2019. Not at goal. Optimal goal > 50 ng/dL. There is also evidence to support a goal of >70 ng/dL in patients with cancer and heart disease. Plan: Continue Vitamin D @50 ,000 IU every week with follow-up for routine testing of Vitamin D at least 2-3 times per year to avoid over-replacement.  3. Metabolic syndrome Goal: Lose 7-10% of starting  weight. Counseling: Intensive lifestyle modifications are the first line treatment for this issue. We discussed several lifestyle modifications today and she will continue to work on diet, exercise and weight loss efforts.   -Refill Dulaglutide (TRULICITY) 1.5 MG/0.5ML SOPN; Inject 1.5 mg into the skin once a week.  Dispense: 6 mL; Refill: 0  4. Class 3 severe obesity with serious comorbidity and body mass index (BMI) of 40.0 to 44.9 in adult, unspecified obesity type [facility]) [name] is currently in the action stage of change. As such, her goal is to continue with weight loss efforts. She has agreed to keeping a food journal and adhering to recommended goals of 1100-1200 calories and 75 protein.   Exercise goals: As tolerated.  Behavioral modification strategies: increasing lean protein intake, increasing water intake and emotional eating strategies.  [name] has agreed to follow-up with our clinic in 3 weeks. She was informed of the importance of frequent follow-up visits to maximize her success with intensive lifestyle modifications for her multiple health conditions.   Objective:   Blood pressure 111/77, pulse 78, temperature 98.1 F (36.7 C), temperature source Oral, height 5\' 5"  (1.651 m), weight 241 lb (109.3 kg), SpO2 94 %. Body mass index is 40.1 kg/m.  General: Cooperative, alert, well developed, in no acute distress. HEENT: Conjunctivae and lids unremarkable. Cardiovascular: Regular rhythm.  Lungs: Normal work of breathing. Neurologic: No focal deficits.   Lab Results  Component Value Date   CREATININE 0.81 12/20/2019   BUN 22 12/20/2019   NA 141 12/20/2019   K 4.5 12/20/2019   CL 98 12/20/2019   CO2 27 12/20/2019  Lab Results  Component Value Date   ALT 12 12/20/2019   AST 16 12/20/2019   ALKPHOS 76 12/20/2019   BILITOT 0.2 12/20/2019   Lab Results  Component Value Date   HGBA1C 5.6 12/20/2019   HGBA1C 5.5 06/27/2019   HGBA1C 5.9 (H) 03/27/2019   Lab Results    Component Value Date   INSULIN 8.7 12/20/2019   INSULIN 7.9 03/27/2019   Lab Results  Component Value Date   TSH 5.960 (H) 12/20/2019   Lab Results  Component Value Date   CHOL 187 03/27/2019   HDL 101 03/27/2019   LDLCALC 68 03/27/2019   TRIG 102 03/27/2019   Lab Results  Component Value Date   WBC 9.5 12/20/2019   HGB 17.0 (H) 12/20/2019   HCT 51.7 (H) 12/20/2019   MCV 89 12/20/2019   PLT 314 12/20/2019   Lab Results  Component Value Date   IRON 114 12/20/2019   TIBC 424 12/20/2019   FERRITIN 50 12/20/2019   Obesity Behavioral Intervention:   Approximately 15 minutes were spent on the discussion below.  ASK: We discussed the diagnosis of obesity with [name] today and [name] agreed to give [name] permission to discuss obesity behavioral modification therapy today.  ASSESS: [name] has the diagnosis of obesity and her BMI today is 40.2. [name] is in the action stage of change.   ADVISE: [name] was educated on the multiple health risks of obesity as well as the benefit of weight loss to improve her health. She was advised of the need for long term treatment and the importance of lifestyle modifications to improve her current health and to decrease her risk of future health problems.  AGREE: Multiple dietary modification options and treatment options were discussed and [name] agreed to follow the recommendations documented in the above note.  ARRANGE: [name] was educated on the importance of frequent visits to treat obesity as outlined per CMS and USPSTF guidelines and agreed to schedule her next follow up appointment today.  Attestation Statements:   Reviewed by clinician on day of visit: allergies, medications, problem list, medical history, surgical history, family history, social history, and previous encounter notes.  I, Insurance claims handler, CMA, am acting as transcriptionist for Helane Rima, DO  I have reviewed the above documentation for accuracy and  completeness, and I agree with the above. Helane Rima, DO

## 2020-02-11 ENCOUNTER — Other Ambulatory Visit (HOSPITAL_COMMUNITY): Payer: Self-pay | Admitting: Psychiatry

## 2020-02-11 DIAGNOSIS — G4700 Insomnia, unspecified: Secondary | ICD-10-CM

## 2020-02-11 MED ORDER — CLONAZEPAM 0.5 MG PO TABS: 0.5000 mg | ORAL_TABLET | Freq: Three times a day (TID) | ORAL | 1 refills | Status: DC | PRN

## 2020-02-11 MED ORDER — BUPROPION HCL ER (XL) 150 MG PO TB24: 150.0000 mg | ORAL_TABLET | Freq: Every day | ORAL | 1 refills | Status: DC

## 2020-02-11 MED ORDER — DIVALPROEX SODIUM ER 250 MG PO TB24: 500.0000 mg | ORAL_TABLET | Freq: Two times a day (BID) | ORAL | 1 refills | Status: DC

## 2020-02-11 MED ORDER — PRAZOSIN HCL 1 MG PO CAPS: 1.0000 mg | ORAL_CAPSULE | Freq: Every day | ORAL | 1 refills | Status: DC

## 2020-02-11 MED ORDER — DOXEPIN HCL 75 MG PO CAPS
75.0000 mg | ORAL_CAPSULE | Freq: Every day | ORAL | 1 refills | Status: DC
Start: 2020-02-11 — End: 2020-03-13

## 2020-02-11 NOTE — Progress Notes (Unsigned)
Virtual Visit via Telephone Note  I connected with [name] on 02/11/20 at  3:00 PM EDT by telephone and verified that I am speaking with the correct person using two identifiers.  Location: Patient: *** Provider: ***   I discussed the limitations, risks, security and privacy concerns of performing an evaluation and management service by telephone and the availability of in person appointments. I also discussed with the patient that there may be a patient responsible charge related to this service. The patient expressed understanding and agreed to proceed.   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 The patient was provided an opportunity to ask questions and all were answered. The patient agreed with the plan and demonstrated an understanding of the instructions.   The patient was advised to call back or seek an in-person evaluation if the symptoms worsen or if the condition fails to improve as anticipated.  I provided *** minutes of non-face-to-face time during this encounter.   , MD

## 2020-02-15 ENCOUNTER — Other Ambulatory Visit (INDEPENDENT_AMBULATORY_CARE_PROVIDER_SITE_OTHER): Payer: Self-pay | Admitting: Family Medicine

## 2020-02-15 DIAGNOSIS — E559 Vitamin D deficiency, unspecified: Secondary | ICD-10-CM

## 2020-02-18 ENCOUNTER — Ambulatory Visit
Admission: RE | Admit: 2020-02-18 | Discharge: 2020-02-18 | Disposition: A | Discharge status: Home / Self Care | Payer: Medicare Other | Source: Ambulatory Visit | Attending: Pain Medicine | Admitting: Pain Medicine

## 2020-02-18 ENCOUNTER — Other Ambulatory Visit: Payer: Self-pay

## 2020-02-18 DIAGNOSIS — M79605 Pain in left leg: Secondary | ICD-10-CM

## 2020-02-18 DIAGNOSIS — M545 Low back pain, unspecified: Secondary | ICD-10-CM

## 2020-02-18 DIAGNOSIS — G8929 Other chronic pain: Secondary | ICD-10-CM

## 2020-02-20 ENCOUNTER — Telehealth: Payer: Self-pay | Admitting: Neurology

## 2020-02-20 MED ORDER — EMGALITY 120 MG/ML ~~LOC~~ SOAJ: 120.0000 mg | SUBCUTANEOUS | 5 refills | Status: DC

## 2020-02-20 NOTE — Telephone Encounter (Signed)
Pt is requesting a refill for Galcanezumab-gnlm (EMGALITY) 120 MG/ML SOAJ.  Pharmacy:  [facility] [phone]

## 2020-02-20 NOTE — Telephone Encounter (Signed)
Refill sent. Pt has active Rx.

## 2020-02-20 NOTE — Addendum Note (Signed)
Addended by: Bertram Savin on: 02/20/2020 02:21 PM   Modules accepted: Orders

## 2020-02-21 ENCOUNTER — Ambulatory Visit (INDEPENDENT_AMBULATORY_CARE_PROVIDER_SITE_OTHER): Payer: Medicare Other | Admitting: Psychiatry

## 2020-02-21 ENCOUNTER — Other Ambulatory Visit: Payer: Self-pay

## 2020-02-21 ENCOUNTER — Encounter (HOSPITAL_COMMUNITY): Payer: Self-pay | Admitting: Psychiatry

## 2020-02-21 DIAGNOSIS — F3181 Bipolar II disorder: Secondary | ICD-10-CM

## 2020-02-21 DIAGNOSIS — F419 Anxiety disorder, unspecified: Secondary | ICD-10-CM

## 2020-02-21 NOTE — Progress Notes (Signed)
Virtual Visit via Video Note  I connected with [name] on 02/21/20 at  1:30 PM EDT by a video enabled telemedicine application and verified that I am speaking with the correct person using two identifiers.  Location: Patient: Patient Home Provider: Home Office  I discussed the limitations of evaluation and management by telemedicine and the availability of in person appointments. The patient expressed understanding and agreed to proceed.  History of Present Illness: Bipolar DO and Anxiety  Treatment Plan Goals: 1)[name] would like to improve boundary setting and communication skills with significant other to more clearly define the relationship and reduce negative impacts on mental health. 2)[name] would like to improve self esteem through learning and applying CBT skills to address cognitive distortions.  Observations/Objective: Counselor met with Client for individual therapy via Webex. Counselor assessed MH symptoms and progress on treatment plan goals, with patient reporting that she is coping better with life stressors and grief issues than in the past. Client attributes positive changes due to medications numbing feelings and keeping a more full schedule to use as a distraction method. Client presents with moderate depression and moderate anxiety. Client denied suicidal ideation or self-harm behaviors.   In regard to Goal 1, Client reports that she has been seeing progress in this area of life, as evidenced by housemates contributing more to there household, all engaging in more light-hearted activities and her feeling like her boundaries are being respected. Client would like to see consistency in positive behaviors because it positively impacts her. Counselor used MI interventions to engage client in discussion about ways to be preventative and maintain current house functioning. Client receptive to dialoged and identified strategies to apply.   In assessing goal 2,  client reports that she is bathing more frequently during the week, 5-7 times and getting self ready to leave the home almost daily. She has began working our 3-4 times a week as well at the gym or walking outdoors. Client pleased with her progress and noted positive impact on self-esteem. Client noted being able to cope better this anniversary of her mother passing, decided to honor and celebrate her life differently this year. Client to set up additional appointments with provider.   Assessment and Plan: Counselor will continue to meet with patient to address treatment plan goals. Patient will continue to follow recommendations of providers and implement skills learned in session.  Follow Up Instructions: Counselor will send information for next session via Webex.   The patient was advised to call back or seek an in-person evaluation if the symptoms worsen or if the condition fails to improve as anticipated.  I provided 30 minutes of non-face-to-face time during this encounter.   Lise Auer, LCSW

## 2020-02-27 ENCOUNTER — Other Ambulatory Visit (INDEPENDENT_AMBULATORY_CARE_PROVIDER_SITE_OTHER): Payer: Self-pay | Admitting: Family Medicine

## 2020-02-27 ENCOUNTER — Encounter (INDEPENDENT_AMBULATORY_CARE_PROVIDER_SITE_OTHER): Payer: Self-pay

## 2020-02-27 DIAGNOSIS — R600 Localized edema: Secondary | ICD-10-CM

## 2020-02-27 NOTE — Telephone Encounter (Signed)
Message sent to pt.

## 2020-03-04 ENCOUNTER — Ambulatory Visit (INDEPENDENT_AMBULATORY_CARE_PROVIDER_SITE_OTHER): Payer: Medicare Other | Admitting: Family Medicine

## 2020-03-05 ENCOUNTER — Ambulatory Visit (HOSPITAL_COMMUNITY): Payer: Medicare Other | Admitting: Psychiatry

## 2020-03-10 ENCOUNTER — Other Ambulatory Visit (HOSPITAL_COMMUNITY): Payer: Self-pay | Admitting: Psychiatry

## 2020-03-11 ENCOUNTER — Other Ambulatory Visit (INDEPENDENT_AMBULATORY_CARE_PROVIDER_SITE_OTHER): Payer: Self-pay | Admitting: Family Medicine

## 2020-03-11 DIAGNOSIS — E559 Vitamin D deficiency, unspecified: Secondary | ICD-10-CM

## 2020-03-12 ENCOUNTER — Other Ambulatory Visit (INDEPENDENT_AMBULATORY_CARE_PROVIDER_SITE_OTHER): Payer: Self-pay | Admitting: Family Medicine

## 2020-03-12 DIAGNOSIS — J45909 Unspecified asthma, uncomplicated: Secondary | ICD-10-CM

## 2020-03-13 ENCOUNTER — Other Ambulatory Visit: Payer: Self-pay

## 2020-03-13 ENCOUNTER — Telehealth (HOSPITAL_COMMUNITY): Payer: Medicare Other | Admitting: Psychiatry

## 2020-03-13 DIAGNOSIS — F419 Anxiety disorder, unspecified: Secondary | ICD-10-CM

## 2020-03-13 DIAGNOSIS — F3181 Bipolar II disorder: Secondary | ICD-10-CM

## 2020-03-13 DIAGNOSIS — T43225A Adverse effect of selective serotonin reuptake inhibitors, initial encounter: Secondary | ICD-10-CM

## 2020-03-13 DIAGNOSIS — G4700 Insomnia, unspecified: Secondary | ICD-10-CM

## 2020-03-13 MED ORDER — CLONAZEPAM 0.5 MG PO TABS: 0.5000 mg | ORAL_TABLET | Freq: Three times a day (TID) | ORAL | 2 refills | Status: DC | PRN

## 2020-03-13 MED ORDER — BUPROPION HCL ER (XL) 300 MG PO TB24: 300.0000 mg | ORAL_TABLET | Freq: Every day | ORAL | 0 refills | Status: DC

## 2020-03-13 MED ORDER — PRAZOSIN HCL 1 MG PO CAPS: 1.0000 mg | ORAL_CAPSULE | Freq: Every day | ORAL | 0 refills | Status: DC

## 2020-03-13 MED ORDER — DOXEPIN HCL 75 MG PO CAPS: 75.0000 mg | ORAL_CAPSULE | Freq: Every day | ORAL | 0 refills | Status: DC

## 2020-03-13 MED ORDER — DIVALPROEX SODIUM ER 250 MG PO TB24: 500.0000 mg | ORAL_TABLET | Freq: Two times a day (BID) | ORAL | 3 refills | Status: DC

## 2020-03-13 NOTE — Progress Notes (Unsigned)
Virtual Visit via Telephone Note  I connected with [name] on 03/13/20 at  2:45 PM EDT by telephone and verified that I am speaking with the correct person using two identifiers.  Location: Patient: home Provider: office   I discussed the limitations, risks, security and privacy concerns of performing an evaluation and management service by telephone and the availability of in person appointments. I also discussed with the patient that there may be a patient responsible charge related to this service. The patient expressed understanding and agreed to proceed.   History of Present Illness: [name] is getting about 6-7 hrs/night. It is an improvement for her. She has occasional nightmares and it takes her some time to fall back asleep. She does get irritated easily and will often yell a lot. Her anxiety is ok and manageable. Some days are bad- when in crowds or other stress. She will take Klonopin about 2x/week. It seems like lately the Klonopin 0.5mg  is not effective and has to take 1mg . It helps her to know that she has it. Before taking Klonopin she will try to using breathing exercises.  Her depression seems to be stable. [name] feels numb sometimes and gives the example of not crying over her mother's and uncles passing. She denies SI/HI.  She is also endorsing no libido.   Observations/Objective:  General Appearance: unable to assess  Eye Contact:  unable to assess  Speech:  {Speech:22685}  Volume:  {Volume (PAA):22686}  Mood:  {BHH MOOD:22306}  Affect:  {Affect (PAA):22687}  Thought Process:  {Thought Process (PAA):22688}  Orientation:  {BHH ORIENTATION (PAA):22689}  Thought Content:  {Thought Content:22690}  Suicidal Thoughts:  {ST/HT (PAA):22692}  Homicidal Thoughts:  {ST/HT (PAA):22692}  Memory:  {BHH MEMORY:22881}  Judgement:  {Judgement (PAA):22694}  Insight:  {Insight (PAA):22695}  Psychomotor Activity: unable to assess  Concentration:  {Concentration:21399}   Recall:  {BHH GOOD/FAIR/POOR:22877}  Fund of Knowledge:  {BHH GOOD/FAIR/POOR:22877}  Language:  {BHH GOOD/FAIR/POOR:22877}  Akathisia:  unable to assess  Handed:  {Handed:22697}  AIMS (if indicated):     Assets:  {Assets (PAA):22698}  ADL's:  unable to assess  Cognition:  {chl bhh cognition:304700322}  Sleep:         I have reviewed the information below on 03/13/20 and have updated it Assessment and Plan:  Antidepressant side effect; Bipolar 2 d/o- current episode depressed; Insomnia; GAD; Panic disorder without agoraphobia; Social anxiety d/o   Depakote ER 500mg  BID   Doxepin 75mg  po qHS   Prazosin 1mg  po qHS   Klonopin 0.5mg  po TID prn anxiety  Increase Wellbutrin XL 300mg  po qD for antidepressant  side effect  Follow Up Instructions: In 2-3 months or sooner if needed   I discussed the assessment and treatment plan with the patient. The patient was provided an opportunity to ask questions and all were answered. The patient agreed with the plan and demonstrated an understanding of the instructions.   The patient was advised to call back or seek an in-person evaluation if the symptoms worsen or if the condition fails to improve as anticipated.  I provided 25 minutes of non-face-to-face time during this encounter.   03/15/20, MD

## 2020-03-21 ENCOUNTER — Other Ambulatory Visit (INDEPENDENT_AMBULATORY_CARE_PROVIDER_SITE_OTHER): Payer: Self-pay | Admitting: Family Medicine

## 2020-03-21 DIAGNOSIS — R600 Localized edema: Secondary | ICD-10-CM

## 2020-03-21 DIAGNOSIS — E559 Vitamin D deficiency, unspecified: Secondary | ICD-10-CM

## 2020-03-24 ENCOUNTER — Encounter (HOSPITAL_COMMUNITY): Payer: Self-pay | Admitting: Psychiatry

## 2020-03-24 ENCOUNTER — Other Ambulatory Visit: Payer: Self-pay

## 2020-03-24 ENCOUNTER — Ambulatory Visit (INDEPENDENT_AMBULATORY_CARE_PROVIDER_SITE_OTHER): Payer: Medicare Other | Admitting: Psychiatry

## 2020-03-24 DIAGNOSIS — F419 Anxiety disorder, unspecified: Secondary | ICD-10-CM

## 2020-03-24 NOTE — Progress Notes (Signed)
Virtual Visit via Video Note  I connected with [name] on 03/24/20 at  2:30 PM EDT by a video enabled telemedicine application and verified that I am speaking with the correct person using two identifiers.  Location: Patient: Patient Home Provider: Home Office  I discussed the limitations of evaluation and management by telemedicine and the availability of in person appointments. The patient expressed understanding and agreed to proceed.  History of Present Illness: Bipolar DO and Anxiety  Treatment Plan Goals: 1)[name] would like to improve boundary setting and communication skills with significant other to more clearly define the relationship and reduce negative impacts on mental health. 2)[name] would like to improve self esteem through learning and applying CBT skills to address cognitive distortions.  Observations/Objective: Counselor met with Client for individual therapy via Webex. Counselor assessed MH symptoms and progress on treatment plan goals, with patient reportingthat she was currently experiencing a migraine and pneumonia. Client presents withmilddepression andmildanxiety. Client denied suicidal ideation or self-harm behaviors.    Goal 1: Counselor assessed application of boundary setting skills, challenges, and progress in relationship communication. Client noted that she is "happy" with how her SO is contributing to the household and how he is supporting her emotionally as well. Client noted that she feels heard and respected. Counselor will continue to monitor to determine if goal progress is achieved at next session.   Goal 2: Counselor used MI skills to process motivation to change and apply skills. Client noted that she has changes eating and activity behaviors, which have resulted in an 8 lb weight loss. Client was happy about this progress and stated that she has been more kind to self, using positive affirmations. Client concerned that medications  are working too well, as she is more relaxed and laid back about things that would have bothered her in the past. Will address at next medication eval. Counselor praised client on progress despite current challenges with health and life circumstances.   Assessment and Plan: Counselor will continue to meet with patient to address treatment plan goals. Patient will continue to follow recommendations of providers and implement skills learned in session.  Follow Up Instructions: Counselor will send information for next session via Webex.   The patient was advised to call back or seek an in-person evaluation if the symptoms worsen or if the condition fails to improve as anticipated.  I provided2mnutes of non-face-to-face time during this encounter.   BLise Auer LCSW

## 2020-03-24 NOTE — Telephone Encounter (Signed)
Dr Wallace pt °

## 2020-03-31 ENCOUNTER — Other Ambulatory Visit: Payer: Self-pay

## 2020-03-31 ENCOUNTER — Encounter (INDEPENDENT_AMBULATORY_CARE_PROVIDER_SITE_OTHER): Payer: Self-pay | Admitting: Family Medicine

## 2020-03-31 ENCOUNTER — Ambulatory Visit (INDEPENDENT_AMBULATORY_CARE_PROVIDER_SITE_OTHER): Payer: Medicare Other | Admitting: Family Medicine

## 2020-03-31 VITALS — BP 127/80 | HR 95 | Temp 98.2°F | Ht 65.0 in | Wt 246.0 lb

## 2020-03-31 DIAGNOSIS — E8881 Metabolic syndrome: Secondary | ICD-10-CM | POA: Diagnosis not present

## 2020-03-31 DIAGNOSIS — F3181 Bipolar II disorder: Secondary | ICD-10-CM

## 2020-03-31 DIAGNOSIS — Z9884 Bariatric surgery status: Secondary | ICD-10-CM

## 2020-03-31 DIAGNOSIS — Z6841 Body Mass Index (BMI) 40.0 and over, adult: Secondary | ICD-10-CM

## 2020-03-31 DIAGNOSIS — G894 Chronic pain syndrome: Secondary | ICD-10-CM | POA: Diagnosis not present

## 2020-04-01 NOTE — Progress Notes (Signed)
Chief Complaint:   OBESITY [name] is here to discuss her progress with her obesity treatment plan along with follow-up of her obesity related diagnoses.   Today's visit was #: 17 Starting weight: 250 lbs Starting date: 03/27/2019 Today's weight: 246 lbs Today's date: 03/31/2020 Total lbs lost to date: 4 lbs Body mass index is 40.94 kg/m.  Total weight loss percentage to date: -1.60%  Interim History: She stopped oxycodone a few weeks ago.  She started taking gabapentin again.  She has had increased pain, less sleep, and more depression.  She endorses vertigo.    Food:  Protein shakes/frutis and vegetables.  Nutrition Plan: keeping a food journal and adhering to recommended goals of 1100-1200 calories and 75 grams of protein.  Anti-obesity medications: Trulicity.  Activity: None.  Assessment/Plan:   1. Chronic pain syndrome Not controlled.  She is followed by Pain Management. I encouraged her to call for an urgent appointment to review her medication plan. Red flags reviewed.  2. Metabolic syndrome Visceral obesity is 16. Starting goal: Lose 7-10% of starting weight. She will continue to focus on protein-rich, low simple carbohydrate foods. We reviewed the importance of hydration, regular exercise for stress reduction, and restorative sleep.  We will continue to check lab work every 3 months, with 10% weight loss, or should any other concerns arise.  3. History of gastric bypass [name] is at risk for malnutrition due to her previous bariatric surgery.   Counseling  You may need to eat 3 meals and 2 snacks, or 5 small meals each day in order to reach your protein and calorie goals.   Allow at least 15 minutes for each meal so that you can eat mindfully. Listen to your body so that you do not overeat. For most people, your sleeve or pouch will comfortably hold 4-6 ounces.  Eat foods from all food groups. This includes fruits and vegetables, grains, dairy, and meat and  other proteins.  Include a protein-rich food at every meal and snack, and eat the protein food first.   You should be taking a Bariatric Multivitamin as well as calcium.   4. Severe depressed bipolar II disorder without psychotic features (HCC) Not controlled.  [name] is followed by Psychiatry.  We will continue to monitor symptoms as they relate to her weight loss journey. This issue directly impacts care plan for optimization of BMI and metabolic health as it impacts the patient's ability to make lifestyle changes.  5. Class 3 severe obesity with serious comorbidity and body mass index (BMI) of 40.0 to 44.9 in adult, unspecified obesity type [name])  Course: [name] is currently in the action stage of change. As such, her goal is to continue with weight loss efforts.   Nutrition goals: She has agreed to practicing portion control and making smarter food choices, such as increasing vegetables and decreasing simple carbohydrates.   Exercise goals: As tolerated.  Behavioral modification strategies: increasing lean protein intake, increasing water intake, increasing high fiber foods and emotional eating strategies.  [name] has agreed to follow-up with our clinic in 3 weeks. She was informed of the importance of frequent follow-up visits to maximize her success with intensive lifestyle modifications for her multiple health conditions.   Objective:   Blood pressure 127/80, pulse 95, temperature 98.2 F (36.8 C), temperature source Oral, height 5\' 5"  (1.651 m), weight 246 lb (111.6 kg), SpO2 98 %. Body mass index is 40.94 kg/m.  General: Cooperative, alert, well developed, in no acute  distress. HEENT: Conjunctivae and lids unremarkable. Cardiovascular: Regular rhythm.  Lungs: Normal work of breathing. Neurologic: No focal deficits.   Lab Results  Component Value Date   CREATININE 0.81 12/20/2019   BUN 22 12/20/2019   NA 141 12/20/2019   K 4.5 12/20/2019   CL 98 12/20/2019   CO2 27  12/20/2019   Lab Results  Component Value Date   ALT 12 12/20/2019   AST 16 12/20/2019   ALKPHOS 76 12/20/2019   BILITOT 0.2 12/20/2019   Lab Results  Component Value Date   HGBA1C 5.6 12/20/2019   HGBA1C 5.5 06/27/2019   HGBA1C 5.9 (H) 03/27/2019   Lab Results  Component Value Date   INSULIN 8.7 12/20/2019   INSULIN 7.9 03/27/2019   Lab Results  Component Value Date   TSH 5.960 (H) 12/20/2019   Lab Results  Component Value Date   CHOL 187 03/27/2019   HDL 101 03/27/2019   LDLCALC 68 03/27/2019   TRIG 102 03/27/2019   Lab Results  Component Value Date   WBC 9.5 12/20/2019   HGB 17.0 (H) 12/20/2019   HCT 51.7 (H) 12/20/2019   MCV 89 12/20/2019   PLT 314 12/20/2019   Lab Results  Component Value Date   IRON 114 12/20/2019   TIBC 424 12/20/2019   FERRITIN 50 12/20/2019   Obesity Behavioral Intervention:   Approximately 15 minutes were spent on the discussion below.  ASK: We discussed the diagnosis of obesity with [name] today and [name] agreed to give [name] permission to discuss obesity behavioral modification therapy today.  ASSESS: Trula has the diagnosis of obesity and her BMI today is 40.9. [name] is in the action stage of change.   ADVISE: [name] was educated on the multiple health risks of obesity as well as the benefit of weight loss to improve her health. She was advised of the need for long term treatment and the importance of lifestyle modifications to improve her current health and to decrease her risk of future health problems.  AGREE: Multiple dietary modification options and treatment options were discussed and [name] agreed to follow the recommendations documented in the above note.  ARRANGE: [name] was educated on the importance of frequent visits to treat obesity as outlined per CMS and USPSTF guidelines and agreed to schedule her next follow up appointment today.  Attestation Statements:   Reviewed by clinician on day of visit:  allergies, medications, problem list, medical history, surgical history, family history, social history, and previous encounter notes.  I, [name], CMA, am acting as transcriptionist for Helane Rima, DO  I have reviewed the above documentation for accuracy and completeness, and I agree with the above. Helane Rima, DO

## 2020-04-06 ENCOUNTER — Other Ambulatory Visit (HOSPITAL_COMMUNITY): Payer: Self-pay | Admitting: Psychiatry

## 2020-04-06 DIAGNOSIS — T43225A Adverse effect of selective serotonin reuptake inhibitors, initial encounter: Secondary | ICD-10-CM

## 2020-04-07 ENCOUNTER — Ambulatory Visit (HOSPITAL_COMMUNITY): Payer: Medicare Other | Admitting: Psychiatry

## 2020-04-07 ENCOUNTER — Encounter (HOSPITAL_COMMUNITY): Payer: Self-pay | Admitting: Psychiatry

## 2020-04-07 ENCOUNTER — Other Ambulatory Visit: Payer: Self-pay

## 2020-04-07 DIAGNOSIS — F419 Anxiety disorder, unspecified: Secondary | ICD-10-CM

## 2020-04-07 NOTE — Progress Notes (Signed)
Counselor attempted to connect with Client via Webex and phone. Client did not respond or connect. Counselor sent e-mail to check in and remind of meeting. Counselor was not contacted back by client before end of session time. Will attempt to reconnect at next session.

## 2020-04-17 ENCOUNTER — Other Ambulatory Visit (HOSPITAL_COMMUNITY): Payer: Self-pay | Admitting: Psychiatry

## 2020-04-17 ENCOUNTER — Other Ambulatory Visit (INDEPENDENT_AMBULATORY_CARE_PROVIDER_SITE_OTHER): Payer: Self-pay | Admitting: Family Medicine

## 2020-04-17 DIAGNOSIS — E559 Vitamin D deficiency, unspecified: Secondary | ICD-10-CM

## 2020-04-17 DIAGNOSIS — E8881 Metabolic syndrome: Secondary | ICD-10-CM

## 2020-04-17 DIAGNOSIS — F411 Generalized anxiety disorder: Secondary | ICD-10-CM

## 2020-04-17 DIAGNOSIS — F333 Major depressive disorder, recurrent, severe with psychotic symptoms: Secondary | ICD-10-CM

## 2020-04-17 DIAGNOSIS — R600 Localized edema: Secondary | ICD-10-CM

## 2020-04-17 DIAGNOSIS — F4001 Agoraphobia with panic disorder: Secondary | ICD-10-CM

## 2020-04-17 DIAGNOSIS — E88819 Insulin resistance, unspecified: Secondary | ICD-10-CM

## 2020-04-18 ENCOUNTER — Other Ambulatory Visit (HOSPITAL_COMMUNITY): Payer: Self-pay | Admitting: Psychiatry

## 2020-04-18 ENCOUNTER — Other Ambulatory Visit (INDEPENDENT_AMBULATORY_CARE_PROVIDER_SITE_OTHER): Payer: Self-pay | Admitting: Family Medicine

## 2020-04-18 DIAGNOSIS — T43225A Adverse effect of selective serotonin reuptake inhibitors, initial encounter: Secondary | ICD-10-CM

## 2020-04-18 DIAGNOSIS — E8881 Metabolic syndrome: Secondary | ICD-10-CM

## 2020-04-21 ENCOUNTER — Encounter (INDEPENDENT_AMBULATORY_CARE_PROVIDER_SITE_OTHER): Payer: Self-pay

## 2020-04-21 ENCOUNTER — Ambulatory Visit (INDEPENDENT_AMBULATORY_CARE_PROVIDER_SITE_OTHER): Payer: Medicare Other | Admitting: Psychiatry

## 2020-04-21 ENCOUNTER — Encounter (HOSPITAL_COMMUNITY): Payer: Self-pay | Admitting: Psychiatry

## 2020-04-21 ENCOUNTER — Other Ambulatory Visit: Payer: Self-pay

## 2020-04-21 DIAGNOSIS — F419 Anxiety disorder, unspecified: Secondary | ICD-10-CM | POA: Diagnosis not present

## 2020-04-21 NOTE — Progress Notes (Signed)
Virtual Visit via Video Note  I connected with [name] on 04/21/20 at  2:30 PM EST by a video enabled telemedicine application and verified that I am speaking with the correct person using two identifiers.  Location: Patient: Patient Home Provider: Home Office  I discussed the limitations of evaluation and management by telemedicine and the availability of in person appointments. The patient expressed understanding and agreed to proceed.  History of Present Illness: Bipolar DO and Anxiety  Treatment Plan Goals: 1)[name] would like to improve boundary setting and communication skills with significant other to more clearly define the relationship and reduce negative impacts on mental health. 2)[name] would like to improve self esteem through learning and applying CBT skills to address cognitive distortions.  Observations/Objective: Counselor met with [name] for individual therapy via Webex. Counselor assessed MH symptoms and progress on treatment plan goals, with patient reportingthat she is currently experiencing some physical health symptoms, but is able to participate in session. [name] noted that she had a good experience with her partner's family over the holidays as well as grief reminders as it relates to family members who have passed or are estranged. Counselor provided grief processing and support in exploring impact of emotions on mental mental health.[name] presents withmilddepression andmildanxiety. [name] denied suicidal ideation or self-harm behaviors, however she disclosed that she has had recurring dreams of "leaving this life" or "escaping her current reality." Counselor reviewed safety plan and assessed safety. [name] has no plan or intent to harm self or others. [name] to meet with psychiatrist in the following weeks to address mediatation outcomes.  Goal 1: Counselor assessed progress towards the goal of setting appropriate boundaries with significant  other. [name] reports that she and her partner's relationship has improved overall, citing examples of how she set boundaries and his respect and adherence to them now. [name] stated that she has become close with his family and view them as positive supports in her life. [name] reports that mediations make her less upset and emotional, making space for her to communicate more effectively to work through problems. Counselor reviewed boundary setting techniques.  Goal 2: Counselor briefly assessed progress in applying CBT skills to improve self-esteem. [name] noted a variety of ways she is applying self-care practices and prompting self to make positive statements to herself about herself. [name] disclosed that she struggles in believing positive aspects about self when it comes to relationships with close family members. Counselor prompted [name] to journal about specific intrusive thoughts that come to mind when triggered in this way to review at next session.   Assessment and Plan: Counselor will continue to meet with patient to address treatment plan goals. Patient will continue to follow recommendations of providers and implement skills learned in session.  Follow Up Instructions: Counselor will send information for next session via Webex.   The patient was advised to call back or seek an in-person evaluation if the symptoms worsen or if the condition fails to improve as anticipated.  I provided76mnutes of non-face-to-face time during this encounter.   BLise Auer LCSW

## 2020-04-21 NOTE — Telephone Encounter (Signed)
Last OV with Dr Wallace 

## 2020-04-21 NOTE — Telephone Encounter (Signed)
Message sent to pt.

## 2020-04-22 ENCOUNTER — Ambulatory Visit: Payer: Medicare Other | Admitting: Neurology

## 2020-04-23 ENCOUNTER — Ambulatory Visit (INDEPENDENT_AMBULATORY_CARE_PROVIDER_SITE_OTHER): Payer: Medicare Other | Admitting: Family Medicine

## 2020-04-25 MED ORDER — TRULICITY 3 MG/0.5ML ~~LOC~~ SOAJ: 3.0000 mg | SUBCUTANEOUS | 0 refills | Status: DC

## 2020-04-28 ENCOUNTER — Encounter (INDEPENDENT_AMBULATORY_CARE_PROVIDER_SITE_OTHER): Payer: Self-pay | Admitting: Family Medicine

## 2020-04-28 ENCOUNTER — Ambulatory Visit (INDEPENDENT_AMBULATORY_CARE_PROVIDER_SITE_OTHER): Payer: Medicare Other | Admitting: Neurology

## 2020-04-28 ENCOUNTER — Other Ambulatory Visit: Payer: Self-pay

## 2020-04-28 ENCOUNTER — Ambulatory Visit (INDEPENDENT_AMBULATORY_CARE_PROVIDER_SITE_OTHER): Payer: Medicare Other | Admitting: Family Medicine

## 2020-04-28 VITALS — BP 112/77 | HR 98 | Temp 98.1°F | Ht 65.0 in | Wt 247.0 lb

## 2020-04-28 DIAGNOSIS — E8881 Metabolic syndrome: Secondary | ICD-10-CM

## 2020-04-28 DIAGNOSIS — E65 Localized adiposity: Secondary | ICD-10-CM

## 2020-04-28 DIAGNOSIS — G43711 Chronic migraine without aura, intractable, with status migrainosus: Secondary | ICD-10-CM

## 2020-04-28 DIAGNOSIS — F3181 Bipolar II disorder: Secondary | ICD-10-CM | POA: Diagnosis not present

## 2020-04-28 DIAGNOSIS — Z6841 Body Mass Index (BMI) 40.0 and over, adult: Secondary | ICD-10-CM | POA: Diagnosis not present

## 2020-04-28 MED ORDER — EMGALITY 120 MG/ML ~~LOC~~ SOAJ
120.0000 mg | SUBCUTANEOUS | 4 refills | Status: DC
Start: 2020-04-28 — End: 2020-11-03

## 2020-04-28 MED ORDER — EPINEPHRINE 0.3 MG/0.3ML IJ SOAJ: 0.3000 mg | INTRAMUSCULAR | 3 refills | Status: DC | PRN

## 2020-04-28 NOTE — Progress Notes (Signed)
Consent Form Botulism Toxin Injection For Chronic Migraine  Interval history 04/28/2020: Doing great, More than 60% improvement in frequency but also in severity. Ajovy not approve using emgality instead. she has failed imitrex and maxalt and cannot take anymore triptans, contraindicated due to side effects, gave her some samples. She feels clenching is a problem and she has pain when clenching in the temples as well. She had HTN on aimovig, stopped. She had botox in the past and we restarted on 03/2019, doing great. Loves emgality as an add on as well. No date set for her wedding yet, it has been 2.5 years. She is very anxious and took anxiety medication today and did better but she is very anxious. She wants an outdoor wedding. She did much better, she took her anxiety and pain meds. She [facility]    Reviewed orally with patient, additionally signature is on file:  Botulism toxin has been approved by the Federal drug administration for treatment of chronic migraine. Botulism toxin does not cure chronic migraine and it may not be effective in some patients.  The administration of botulism toxin is accomplished by injecting a small amount of toxin into the muscles of the neck and head. Dosage must be titrated for each individual. Any benefits resulting from botulism toxin tend to wear off after 3 months with a repeat injection required if benefit is to be maintained. Injections are usually done every 3-4 months with maximum effect peak achieved by about 2 or 3 weeks. Botulism toxin is expensive and you should be sure of what costs you will incur resulting from the injection.  The side effects of botulism toxin use for chronic migraine may include:   -Transient, and usually mild, facial weakness with facial injections  -Transient, and usually mild, head or neck weakness with head/neck injections  -Reduction or loss of forehead facial animation due to  forehead muscle weakness  -Eyelid drooping  -Dry eye  -Pain at the site of injection or bruising at the site of injection  -Double vision  -Potential unknown long term risks  Contraindications: You should not have Botox if you are pregnant, nursing, allergic to albumin, have an infection, skin condition, or muscle weakness at the site of the injection, or have myasthenia gravis, Lambert-Eaton syndrome, or ALS.  It is also possible that as with any injection, there may be an allergic reaction or no effect from the medication. Reduced effectiveness after repeated injections is sometimes seen and rarely infection at the injection site may occur. All care will be taken to prevent these side effects. If therapy is given over a long time, atrophy and wasting in the muscle injected may occur. Occasionally the patient's become refractory to treatment because they develop antibodies to the toxin. In this event, therapy needs to be modified.  I have read the above information and consent to the administration of botulism toxin.    BOTOX PROCEDURE NOTE FOR MIGRAINE HEADACHE    Contraindications and precautions discussed with patient(above). Aseptic procedure was observed and patient tolerated procedure. Procedure performed by Dr. Artemio Aly  The condition has existed for more than 6 months, and pt does not have a diagnosis of ALS, Myasthenia Gravis or Lambert-Eaton Syndrome.  Risks and benefits of injections discussed and pt agrees to proceed with the procedure.  Written consent obtained  These injections are medically necessary. Pt  receives good benefits from these injections. These injections do not cause sedations or  hallucinations which the oral therapies may cause.  Description of procedure:  The patient was placed in a sitting position. The standard protocol was used for Botox as follows, with 5 units of Botox injected at each site:   -Procerus muscle, midline injection  -Corrugator  muscle, bilateral injection  -Frontalis muscle, bilateral injection, with 2 sites each side, medial injection was performed in the upper one third of the frontalis muscle, in the region vertical from the medial inferior edge of the superior orbital rim. The lateral injection was again in the upper one third of the forehead vertically above the lateral limbus of the cornea, 1.5 cm lateral to the medial injection site.  -Temporalis muscle injection, 4 sites, bilaterally. The first injection was 3 cm above the tragus of the ear, second injection site was 1.5 cm to 3 cm up from the first injection site in line with the tragus of the ear. The third injection site was 1.5-3 cm forward between the first 2 injection sites. The fourth injection site was 1.5 cm posterior to the second injection site.   -Occipitalis muscle injection, 3 sites, bilaterally. The first injection was done one half way between the occipital protuberance and the tip of the mastoid process behind the ear. The second injection site was done lateral and superior to the first, 1 fingerbreadth from the first injection. The third injection site was 1 fingerbreadth superiorly and medially from the first injection site.  -Cervical paraspinal muscle injection, 2 sites, bilateral knee first injection site was 1 cm from the midline of the cervical spine, 3 cm inferior to the lower border of the occipital protuberance. The second injection site was 1.5 cm superiorly and laterally to the first injection site.  -Trapezius muscle injection was performed at 3 sites, bilaterally. The first injection site was in the upper trapezius muscle halfway between the inflection point of the neck, and the acromion. The second injection site was one half way between the acromion and the first injection site. The third injection was done between the first injection site and the inflection point of the neck.   Will return for repeat injection in 3 months.   200  units of Botox was used, any Botox not injected was wasted. The patient tolerated the procedure well, there were no complications of the above procedure.

## 2020-04-28 NOTE — Progress Notes (Signed)
Botox- 200 units x 1 vial Lot: [id number] Expiration: 10/2022 NDC: 0023-3921-02  Bacteriostatic 0.9% Sodium Chloride- 4mL total Lot: [id number] Expiration: 06/24/2021 NDC: 0409-1966-02  Dx: G43.711 B/B  

## 2020-04-29 NOTE — Progress Notes (Signed)
Chief Complaint:   OBESITY [name] is here to discuss her progress with her obesity treatment plan along with follow-up of her obesity related diagnoses.   Today's visit was #: 18 Starting weight: 250 lbs Starting date: 03/27/2019 Today's weight: 247 lbs Today's date: 04/28/2020 Total lbs lost to date: 3 lbs Body mass index is 41.1 kg/m.  Total weight loss percentage to date: -1.20%  Interim History: [name] is taking Nucynta 75 mg three times daily, which is causing slurring and sleepiness.  She is still off oxycodone.  She says her pain is controlled.  She is just back from a funeral.  She says her cousin died in childbirth.  She is currently having financial difficulties. Nutrition Plan: practicing portion control and making smarter food choices, such as increasing vegetables and decreasing simple carbohydrates for 40% of the time.  Anti-obesity medications: Trulicity 3 mg weekly.  Activity: None at this time. Stress:  Increased.  Assessment/Plan:   1. Metabolic syndrome Starting goal: Lose 7-10% of starting weight. She will continue to focus on protein-rich, low simple carbohydrate foods. We reviewed the importance of hydration, regular exercise for stress reduction, and restorative sleep.  We will continue to check lab work every 3 months, with 10% weight loss, or should any other concerns arise.  - Refill Dulaglutide (TRULICITY) 3 MG/0.5ML SOPN; Inject 3 mg as directed once a week.  Dispense: 2 mL; Refill: 0  2. Visceral obesity Current visceral fat rating: 17. Visceral fat rating should be < 13. Visceral adipose tissue is a hormonally active component of total body fat. This body composition phenotype is associated with medical disorders such as metabolic syndrome, cardiovascular disease and several malignancies including prostate, breast, and colorectal cancers. Starting goal: Lose 7-10% of starting weight.   3. 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  She will discuss with her psychiatrist.   4. Class 3 severe obesity with serious comorbidity and body mass index (BMI) of 40.0 to 44.9 in adult, unspecified obesity type [facility])  Course: [name] is currently in the action stage of change. As such, her goal is to continue with weight loss efforts.   Nutrition goals: She has agreed to practicing portion control and making smarter food choices, such as increasing vegetables and decreasing simple carbohydrates.   Exercise goals: All adults should avoid inactivity. Some physical activity is better than none, and adults who participate in any amount of physical activity gain some health benefits.  Behavioral modification strategies: increasing lean protein intake, decreasing simple carbohydrates, increasing vegetables and increasing water intake.  [name] has agreed to follow-up with our clinic in 4 weeks. She was informed of the importance of frequent follow-up visits to maximize her success with intensive lifestyle modifications for her multiple health conditions.   Objective:   Blood pressure 112/77, pulse 98, temperature 98.1 F (36.7 C), temperature source Oral, height 5\' 5"  (1.651 m), weight 247 lb (112 kg), SpO2 95 %. Body mass index is 41.1 kg/m.  General: Cooperative, alert, well developed, in no acute distress. HEENT: Conjunctivae and lids unremarkable. Cardiovascular: Regular rhythm.  Lungs: Normal work of breathing. Neurologic: No focal deficits.   Lab Results  Component Value Date   CREATININE 0.81 12/20/2019   BUN 22 12/20/2019   NA 141 12/20/2019   K 4.5 12/20/2019   CL 98 12/20/2019   CO2 27 12/20/2019   Lab Results  Component Value Date   ALT 12 12/20/2019   AST  16 12/20/2019   ALKPHOS 76 12/20/2019   BILITOT 0.2 12/20/2019   Lab Results  Component Value Date   HGBA1C 5.6 12/20/2019   HGBA1C 5.5 06/27/2019   HGBA1C 5.9 (H) 03/27/2019   Lab Results  Component  Value Date   INSULIN 8.7 12/20/2019   INSULIN 7.9 03/27/2019   Lab Results  Component Value Date   TSH 5.960 (H) 12/20/2019   Lab Results  Component Value Date   CHOL 187 03/27/2019   HDL 101 03/27/2019   LDLCALC 68 03/27/2019   TRIG 102 03/27/2019   Lab Results  Component Value Date   WBC 9.5 12/20/2019   HGB 17.0 (H) 12/20/2019   HCT 51.7 (H) 12/20/2019   MCV 89 12/20/2019   PLT 314 12/20/2019   Lab Results  Component Value Date   IRON 114 12/20/2019   TIBC 424 12/20/2019   FERRITIN 50 12/20/2019   Obesity Behavioral Intervention:   Approximately 15 minutes were spent on the discussion below.  ASK: We discussed the diagnosis of obesity with [name] today and [name] agreed to give [name] permission to discuss obesity behavioral modification therapy today.  ASSESS: [name] has the diagnosis of obesity and her BMI today is 41.1. [name] is in the action stage of change.   ADVISE: [name] was educated on the multiple health risks of obesity as well as the benefit of weight loss to improve her health. She was advised of the need for long term treatment and the importance of lifestyle modifications to improve her current health and to decrease her risk of future health problems.  AGREE: Multiple dietary modification options and treatment options were discussed and [name] agreed to follow the recommendations documented in the above note.  ARRANGE: [name] was educated on the importance of frequent visits to treat obesity as outlined per CMS and USPSTF guidelines and agreed to schedule her next follow up appointment today.  Attestation Statements:   Reviewed by clinician on day of visit: allergies, medications, problem list, medical history, surgical history, family history, social history, and previous encounter notes.  I, Insurance claims handler, CMA, am acting as transcriptionist for Helane Rima, DO  I have reviewed the above documentation for accuracy and completeness, and I  agree with the above. Helane Rima, DO

## 2020-04-30 ENCOUNTER — Telehealth: Payer: Self-pay

## 2020-04-30 NOTE — Telephone Encounter (Signed)
APPROVED  Request Reference Number: [id number]. NURTEC TAB 75MG  ODT is approved through 05/23/2021. Your patient may now fill this prescription and it will be covered.

## 2020-04-30 NOTE — Telephone Encounter (Signed)
PA request for Nurtec 75mg  submitted on CMM, Key: [id number]  Awaiting determination from [facility].

## 2020-05-01 ENCOUNTER — Ambulatory Visit (HOSPITAL_COMMUNITY): Payer: Medicare Other | Admitting: Psychiatry

## 2020-05-12 ENCOUNTER — Other Ambulatory Visit (INDEPENDENT_AMBULATORY_CARE_PROVIDER_SITE_OTHER): Payer: Self-pay | Admitting: Family Medicine

## 2020-05-12 DIAGNOSIS — G8929 Other chronic pain: Secondary | ICD-10-CM

## 2020-05-12 DIAGNOSIS — J309 Allergic rhinitis, unspecified: Secondary | ICD-10-CM

## 2020-05-13 MED ORDER — FLUTICASONE PROPIONATE 50 MCG/ACT NA SUSP: 1.0000 | NASAL | 0 refills | Status: DC | PRN

## 2020-05-13 NOTE — Telephone Encounter (Signed)
This patient was last seen by Dr. Earlene Plater, and currently has an upcoming appt scheduled on 05/28/20 with her.

## 2020-05-13 NOTE — Telephone Encounter (Signed)
Last filled 10/31/19  Please advise if okay to fill

## 2020-05-13 NOTE — Telephone Encounter (Signed)
This patient was last seen by Dr. Wallace, and currently has an upcoming appt scheduled on 05/28/20 with her.  

## 2020-05-14 ENCOUNTER — Encounter (INDEPENDENT_AMBULATORY_CARE_PROVIDER_SITE_OTHER): Payer: Self-pay

## 2020-05-14 ENCOUNTER — Other Ambulatory Visit (INDEPENDENT_AMBULATORY_CARE_PROVIDER_SITE_OTHER): Payer: Self-pay | Admitting: Family Medicine

## 2020-05-14 DIAGNOSIS — E559 Vitamin D deficiency, unspecified: Secondary | ICD-10-CM

## 2020-05-14 NOTE — Telephone Encounter (Signed)
Message sent to pt.

## 2020-05-14 NOTE — Telephone Encounter (Signed)
Dr.Wallace °

## 2020-05-15 MED ORDER — TRULICITY 3 MG/0.5ML ~~LOC~~ SOAJ: 3.0000 mg | SUBCUTANEOUS | 0 refills | Status: AC

## 2020-05-16 ENCOUNTER — Other Ambulatory Visit (INDEPENDENT_AMBULATORY_CARE_PROVIDER_SITE_OTHER): Payer: Self-pay | Admitting: Family Medicine

## 2020-05-16 DIAGNOSIS — R600 Localized edema: Secondary | ICD-10-CM

## 2020-05-26 ENCOUNTER — Ambulatory Visit (HOSPITAL_COMMUNITY): Payer: Medicare Other | Admitting: Psychiatry

## 2020-05-28 ENCOUNTER — Encounter (HOSPITAL_COMMUNITY): Payer: Self-pay | Admitting: Psychiatry

## 2020-05-28 ENCOUNTER — Other Ambulatory Visit: Payer: Self-pay

## 2020-05-28 ENCOUNTER — Ambulatory Visit (INDEPENDENT_AMBULATORY_CARE_PROVIDER_SITE_OTHER): Payer: Medicare Other | Admitting: Psychiatry

## 2020-05-28 ENCOUNTER — Ambulatory Visit (INDEPENDENT_AMBULATORY_CARE_PROVIDER_SITE_OTHER): Payer: Medicare Other | Admitting: Family Medicine

## 2020-05-28 DIAGNOSIS — F419 Anxiety disorder, unspecified: Secondary | ICD-10-CM | POA: Diagnosis not present

## 2020-05-28 NOTE — Progress Notes (Signed)
Virtual Visit via Video Note  I connected with [name] on 05/28/20 at  3:30 PM EST by a video enabled telemedicine application and verified that I am speaking with the correct person using two identifiers.  Location: Patient: Patient Home Provider: Home Office  I discussed the limitations of evaluation and management by telemedicine and the availability of in person appointments. The patient expressed understanding and agreed to proceed.  History of Present Illness: Bipolar DO and Anxiety  Treatment Plan Goals: 1)Nadira would like to improve boundary setting and communication skills with significant other to more clearly define the relationship and reduce negative impacts on mental health. 2)Silva would like to improve self esteem through learning and applying CBT skills to address cognitive distortions.  Observations/Objective: Counselor met with Client for individual therapy via Webex. Counselor assessed MH symptoms and progress on treatment plan goals, with patient reportingthat she was having challenges with her phone, but could still use audio of webex. Client reporting positive outcomes in behavior applicaion of skills with partner and for personal needs/routine. Client presents withmilddepression andmildanxiety.   Goal 1: Counselor assessed progress towards the goal of setting appropriate boundaries with significant other. Client reports that she and her partner's relationship has improved overall, citing examples of how she set boundaries and his respect and adherence to them now. Client stated that she has become close with his family and view them as positive supports in her life. Client reports that mediations make her less upset and emotional, making space for her to communicate more effectively to work through problems. Counselor reviewed boundary setting techniques. Client having difficulties with balancing emotions and boundaries with son. Counselor and  Client identified appropriate timelines and boundaries Client can set with him to alleviate her financial anxiety related to their shared bills.   Goal 2: Counselor briefly assessed progress in applying CBT skills to improve self-esteem. Client noted a variety of ways she is applying self-care practices and prompting self to make positive statements to herself about herself. Client disclosed that she struggles in believing positive aspects about self when it comes to relationships with close family members. Counselor prompted Client to journal about specific intrusive thoughts that come to mind when triggered in this way to review at next session.   Assessment and Plan: Counselor will continue to meet with patient to address treatment plan goals. Patient will continue to follow recommendations of providers and implement skills learned in session.  Follow Up Instructions: Counselor will send information for next session via Webex.   The patient was advised to call back or seek an in-person evaluation if the symptoms worsen or if the condition fails to improve as anticipated.  I provided15mnutes of non-face-to-face time during this encounter.   BLise Auer LCSW

## 2020-05-29 ENCOUNTER — Other Ambulatory Visit: Payer: Self-pay

## 2020-05-29 ENCOUNTER — Telehealth (INDEPENDENT_AMBULATORY_CARE_PROVIDER_SITE_OTHER): Payer: Medicare Other | Admitting: Psychiatry

## 2020-05-29 DIAGNOSIS — F3181 Bipolar II disorder: Secondary | ICD-10-CM

## 2020-05-29 DIAGNOSIS — G4700 Insomnia, unspecified: Secondary | ICD-10-CM | POA: Diagnosis not present

## 2020-05-29 DIAGNOSIS — F419 Anxiety disorder, unspecified: Secondary | ICD-10-CM

## 2020-05-29 DIAGNOSIS — T43225A Adverse effect of selective serotonin reuptake inhibitors, initial encounter: Secondary | ICD-10-CM | POA: Diagnosis not present

## 2020-05-29 MED ORDER — BUPROPION HCL ER (XL) 300 MG PO TB24: 300.0000 mg | ORAL_TABLET | Freq: Every day | ORAL | 0 refills | Status: DC

## 2020-05-29 MED ORDER — DIVALPROEX SODIUM ER 250 MG PO TB24: 500.0000 mg | ORAL_TABLET | Freq: Two times a day (BID) | ORAL | 3 refills | Status: DC

## 2020-05-29 MED ORDER — PRAZOSIN HCL 1 MG PO CAPS: 1.0000 mg | ORAL_CAPSULE | Freq: Every day | ORAL | 0 refills | Status: DC

## 2020-05-29 MED ORDER — CLONAZEPAM 0.5 MG PO TABS: 0.5000 mg | ORAL_TABLET | Freq: Three times a day (TID) | ORAL | 2 refills | Status: DC | PRN

## 2020-05-29 MED ORDER — DOXEPIN HCL 75 MG PO CAPS: 75.0000 mg | ORAL_CAPSULE | Freq: Every day | ORAL | 0 refills | Status: DC

## 2020-05-29 NOTE — Progress Notes (Signed)
Virtual Visit via Telephone Note  I connected with [name] on 05/29/20 at  9:00 AM EST by telephone and verified that I am speaking with the correct person using two identifiers.  Location: Patient: home Provider: office   I discussed the limitations, risks, security and privacy concerns of performing an evaluation and management service by telephone and the availability of in person appointments. I also discussed with the patient that there may be a patient responsible charge related to this service. The patient expressed understanding and agreed to proceed.   History of Present Illness:  [name] shares that her depression is not as bad but she still suffers from anxiety with racing thoughts. [name] is having stress induced panic attacks. They are mostly triggered by people not wearing masks. She was recently exposed to COVID but is not having any symptoms. She is having increased appetite that is causing weight gain due to steroid injections. [name] has gained 6 lbs in 4 weeks. She is working with weight loss clinic who suggested she take Phentermine. [name] is thinking about it. She is falling asleep but having allot of dreams.     Observations/Objective:  General Appearance: unable to assess  Eye Contact:  unable to assess  Speech:  Clear and Coherent and Normal Rate  Volume:  Normal  Mood:  Anxious  Affect:  Full Range  Thought Process:  Coherent, Linear and Descriptions of Associations: Intact  Orientation:  Full (Time, Place, and Person)  Thought Content:  Logical  Suicidal Thoughts:  No  Homicidal Thoughts:  No  Memory:  Immediate;   Good  Judgement:  Good  Insight:  Good  Psychomotor Activity: unable to assess  Concentration:  Concentration: Good  Recall:  Good  Fund of Knowledge:  Good  Language:  Good  Akathisia:  unable to assess  Handed:  Right  AIMS (if indicated):     Assets:  Communication Skills Desire for Improvement Financial  Resources/Insurance Housing Resilience Social Support Talents/Skills Transportation Vocational/Educational  ADL's:  unable to assess  Cognition:  WNL  Sleep:         Assessment and Plan: 1. SSRI (selective serotonin reuptake inhibitor) causing adverse effect in therapeutic use, initial encounter - buPROPion (WELLBUTRIN XL) 300 MG 24 hr tablet; Take 1 tablet (300 mg total) by mouth daily.  Dispense: 90 tablet; Refill: 0  2. Anxiety - clonazePAM (KLONOPIN) 0.5 MG tablet; Take 1 tablet (0.5 mg total) by mouth 3 (three) times daily as needed.  Dispense: 90 tablet; Refill: 2  3. Severe depressed bipolar II disorder without psychotic features (HCC) - divalproex (DEPAKOTE ER) 250 MG 24 hr tablet; Take 2 tablets (500 mg total) by mouth in the morning and at bedtime.  Dispense: 120 tablet; Refill: 3 - doxepin (SINEQUAN) 75 MG capsule; Take 1 capsule (75 mg total) by mouth at bedtime.  Dispense: 90 capsule; Refill: 0  4. Insomnia, unspecified type - doxepin (SINEQUAN) 75 MG capsule; Take 1 capsule (75 mg total) by mouth at bedtime.  Dispense: 90 capsule; Refill: 0 - prazosin (MINIPRESS) 1 MG capsule; Take 1 capsule (1 mg total) by mouth at bedtime.  Dispense: 90 capsule; Refill: 0    Follow Up Instructions: In 2-3 months or sooner if needed   I discussed the assessment and treatment plan with the patient. The patient was provided an opportunity to ask questions and all were answered. The patient agreed with the plan and demonstrated an understanding of the instructions.   The patient was  advised to call back or seek an in-person evaluation if the symptoms worsen or if the condition fails to improve as anticipated.  I provided 21 minutes of non-face-to-face time during this encounter.   Oletta Darter, MD

## 2020-06-06 ENCOUNTER — Other Ambulatory Visit (INDEPENDENT_AMBULATORY_CARE_PROVIDER_SITE_OTHER): Payer: Self-pay | Admitting: Family Medicine

## 2020-06-06 DIAGNOSIS — J309 Allergic rhinitis, unspecified: Secondary | ICD-10-CM

## 2020-06-11 ENCOUNTER — Other Ambulatory Visit: Payer: Self-pay

## 2020-06-11 ENCOUNTER — Encounter (HOSPITAL_COMMUNITY): Payer: Self-pay | Admitting: Psychiatry

## 2020-06-11 ENCOUNTER — Ambulatory Visit (INDEPENDENT_AMBULATORY_CARE_PROVIDER_SITE_OTHER): Payer: Medicare Other | Admitting: Psychiatry

## 2020-06-11 DIAGNOSIS — F3181 Bipolar II disorder: Secondary | ICD-10-CM | POA: Diagnosis not present

## 2020-06-11 DIAGNOSIS — F419 Anxiety disorder, unspecified: Secondary | ICD-10-CM | POA: Diagnosis not present

## 2020-06-11 NOTE — Progress Notes (Signed)
Virtual Visit via Video Note  I connected with [name] on 06/11/20 at  3:30 PM EST by a video enabled telemedicine application and verified that I am speaking with the correct person using two identifiers.  Location: Patient: Patient Home Provider: Home Office  I discussed the limitations of evaluation and management by telemedicine and the availability of in person appointments. The patient expressed understanding and agreed to proceed.  History of Present Illness: Bipolar 2 DO and Anxiety  Treatment Plan Goals: 1)Tamelia would like to improve boundary setting and communication skills with significant other to more clearly define the relationship and reduce negative impacts on mental health. 2)Farha would like to improve self esteem through learning and applying CBT skills to address cognitive distortions.  Observations/Objective: Counselor met with Client for individual therapy via Webex. Counselor assessed MH symptoms and progress on treatment plan goals, with patient reportingthat she is currently recovering from [place], grateful that she has not needed hospitalization. Client stated that mental health has been stable throughout.  Client presents withmilddepression andmildanxiety. Client denies SI/HI or self-harm.  Goal 1: Counselor assessed progress towards the goal of setting appropriate boundaries with significant other. Client stated that they, and her son that lives at home have all had COVID and been supportive of each other. Client is concerned about financial impacts of COVID, as they all have been out of work without pay. Client discussed historical behaviors they posses when money has been "tight or scarce", leaving her with the bills. Counselor and Client discussed/role played ways to be preventative in sharing her expectations with contributing to bills. Client felt more confident with communicating with partner and son after processing  strategies.  Assessment and Plan: Counselor will continue to meet with patient to address treatment plan goals. Patient will continue to follow recommendations of providers and implement skills learned in session.  Follow Up Instructions: Counselor will send information for next session via Webex.   The patient was advised to call back or seek an in-person evaluation if the symptoms worsen or if the condition fails to improve as anticipated.  I provided60mnutes of non-face-to-face time during this encounter.   BLise Auer LCSW

## 2020-06-19 ENCOUNTER — Encounter (INDEPENDENT_AMBULATORY_CARE_PROVIDER_SITE_OTHER): Payer: Self-pay | Admitting: Family Medicine

## 2020-06-19 ENCOUNTER — Ambulatory Visit (INDEPENDENT_AMBULATORY_CARE_PROVIDER_SITE_OTHER): Payer: Medicare Other | Admitting: Family Medicine

## 2020-06-19 ENCOUNTER — Other Ambulatory Visit: Payer: Self-pay

## 2020-06-19 VITALS — BP 114/78 | HR 88 | Temp 97.8°F | Ht 65.0 in | Wt 243.0 lb

## 2020-06-19 DIAGNOSIS — E559 Vitamin D deficiency, unspecified: Secondary | ICD-10-CM | POA: Diagnosis not present

## 2020-06-19 DIAGNOSIS — G43909 Migraine, unspecified, not intractable, without status migrainosus: Secondary | ICD-10-CM

## 2020-06-19 DIAGNOSIS — R948 Abnormal results of function studies of other organs and systems: Secondary | ICD-10-CM | POA: Diagnosis not present

## 2020-06-19 DIAGNOSIS — F3181 Bipolar II disorder: Secondary | ICD-10-CM

## 2020-06-19 DIAGNOSIS — Z9884 Bariatric surgery status: Secondary | ICD-10-CM

## 2020-06-19 DIAGNOSIS — Z6841 Body Mass Index (BMI) 40.0 and over, adult: Secondary | ICD-10-CM

## 2020-06-19 MED ORDER — PHENTERMINE HCL 15 MG PO CAPS: 15.0000 mg | ORAL_CAPSULE | ORAL | 1 refills | Status: DC

## 2020-06-23 MED ORDER — VITAMIN D (ERGOCALCIFEROL) 1.25 MG (50000 UNIT) PO CAPS: 50000.0000 [IU] | ORAL_CAPSULE | ORAL | 0 refills | Status: DC

## 2020-06-23 NOTE — Progress Notes (Signed)
Chief Complaint:   OBESITY [name] is here to discuss her progress with her obesity treatment plan along with follow-up of her obesity related diagnoses.   Today's visit was #: 19 Starting weight: 250 lbs Starting date: 03/27/2019 Today's weight: 243 lbs Today's date: 06/19/2020 Total lbs lost to date: 7 lbs Body mass index is 40.44 kg/m.  Total weight loss percentage to date: -2.80%  Interim History: [name] endorses polyphagia and says she has less energy.  She would like to try phentermine.  She says this has been okayed by Psychiatry. Nutrition Plan: practicing portion control and making smarter food choices, such as increasing vegetables and decreasing simple carbohydrates.  Hunger is poorly controlled.   Activity: Walking/yoga for 30-45 minutes 2 times per week.  Assessment/Plan:   1. Abnormal metabolism [name]'s metabolism is lower than it should be. Will start phentermine 15 mg daily, as per below.  - Start phentermine 15 MG capsule; Take 1 capsule (15 mg total) by mouth every morning.  Dispense: 30 capsule; Refill: 1  Risk versus benefits of medication reviewed. The patient understands monitoring parameters and red flags. I have consulted the [place] Controlled Substances Registry for this patient, and feel the risk/benefit ratio today is favorable for proceeding with this prescription for a controlled substance. The patient understands monitoring parameters and red flags.   2. Vitamin D deficiency Not at goal. Current vitamin D is 22.5, tested on 12/20/2019. Optimal goal > 50 ng/dL.   Plan:  [x]   Continue Vitamin D @50 ,000 IU every week. []   Continue home supplement daily. [x]   Follow-up for routine testing of Vitamin D at least 2-3 times per year to avoid over-replacement.  3. Migraine without status migrainosus, not intractable, unspecified migraine type Stable.  [name] take once monthly. We will continue to monitor symptoms as they relate to her weight  loss journey.  4. History of gastric bypass [name] is at risk for malnutrition due to her previous bariatric surgery.   Counseling  You may need to eat 3 meals and 2 snacks, or 5 small meals each day in order to reach your protein and calorie goals.   Allow at least 15 minutes for each meal so that you can eat mindfully. Listen to your body so that you do not overeat. For most people, your sleeve or pouch will comfortably hold 4-6 ounces.  Eat foods from all food groups. This includes fruits and vegetables, grains, dairy, and meat and other proteins.  Include a protein-rich food at every meal and snack, and eat the protein food first.   You should be taking a Bariatric Multivitamin as well as calcium.   5. Bipolar 2 disorder (HCC) Followed by Dr. for this.  She also has a therapist. We will continue to monitor symptoms as they relate to her weight loss journey.  6. Class 3 severe obesity with serious comorbidity and body mass index (BMI) of 40.0 to 44.9 in adult, unspecified obesity type [facility])  Course: [name] is currently in the action stage of change. As such, her goal is to continue with weight loss efforts.   Nutrition goals: She has agreed to practicing portion control and making smarter food choices, such as increasing vegetables and decreasing simple carbohydrates with protein at each meal.   Exercise goals: As is.  Behavioral modification strategies: increasing lean protein intake, decreasing simple carbohydrates, increasing vegetables and increasing water intake.  [name] has agreed to follow-up with our clinic in 3 weeks. She was  informed of the importance of frequent follow-up visits to maximize her success with intensive lifestyle modifications for her multiple health conditions.   Objective:   Blood pressure 114/78, pulse 88, temperature 97.8 F (36.6 C), temperature source Oral, height 5\' 5"  (1.651 m), weight 243 lb (110.2 kg), SpO2 95 %. Body mass index is  40.44 kg/m.  General: Cooperative, alert, well developed, in no acute distress. HEENT: Conjunctivae and lids unremarkable. Cardiovascular: Regular rhythm.  Lungs: Normal work of breathing. Neurologic: No focal deficits.   Lab Results  Component Value Date   CREATININE 0.81 12/20/2019   BUN 22 12/20/2019   NA 141 12/20/2019   K 4.5 12/20/2019   CL 98 12/20/2019   CO2 27 12/20/2019   Lab Results  Component Value Date   ALT 12 12/20/2019   AST 16 12/20/2019   ALKPHOS 76 12/20/2019   BILITOT 0.2 12/20/2019   Lab Results  Component Value Date   HGBA1C 5.6 12/20/2019   HGBA1C 5.5 06/27/2019   HGBA1C 5.9 (H) 03/27/2019   Lab Results  Component Value Date   INSULIN 8.7 12/20/2019   INSULIN 7.9 03/27/2019   Lab Results  Component Value Date   TSH 5.960 (H) 12/20/2019   Lab Results  Component Value Date   CHOL 187 03/27/2019   HDL 101 03/27/2019   LDLCALC 68 03/27/2019   TRIG 102 03/27/2019   Lab Results  Component Value Date   WBC 9.5 12/20/2019   HGB 17.0 (H) 12/20/2019   HCT 51.7 (H) 12/20/2019   MCV 89 12/20/2019   PLT 314 12/20/2019   Lab Results  Component Value Date   IRON 114 12/20/2019   TIBC 424 12/20/2019   FERRITIN 50 12/20/2019   Obesity Behavioral Intervention:   Approximately 15 minutes were spent on the discussion below.  ASK: We discussed the diagnosis of obesity with [name] today and [name] agreed to give [name] permission to discuss obesity behavioral modification therapy today.  ASSESS: [name] has the diagnosis of obesity and her BMI today is 40.5. [name] is in the action stage of change.   ADVISE: [name] was educated on the multiple health risks of obesity as well as the benefit of weight loss to improve her health. She was advised of the need for long term treatment and the importance of lifestyle modifications to improve her current health and to decrease her risk of future health problems.  AGREE: Multiple dietary modification  options and treatment options were discussed and [name] agreed to follow the recommendations documented in the above note.  ARRANGE: [name] was educated on the importance of frequent visits to treat obesity as outlined per CMS and USPSTF guidelines and agreed to schedule her next follow up appointment today.  Attestation Statements:   Reviewed by clinician on day of visit: allergies, medications, problem list, medical history, surgical history, family history, social history, and previous encounter notes.  I, [name], CMA, am acting as transcriptionist for Insurance claims handler, DO  I have reviewed the above documentation for accuracy and completeness, and I agree with the above. Colleen Rima, DO

## 2020-06-26 ENCOUNTER — Other Ambulatory Visit: Payer: Self-pay

## 2020-06-26 ENCOUNTER — Encounter (HOSPITAL_COMMUNITY): Payer: Self-pay | Admitting: Psychiatry

## 2020-06-26 ENCOUNTER — Ambulatory Visit (HOSPITAL_COMMUNITY): Payer: Medicare Other | Admitting: Psychiatry

## 2020-06-26 DIAGNOSIS — F419 Anxiety disorder, unspecified: Secondary | ICD-10-CM

## 2020-06-26 NOTE — Progress Notes (Signed)
Client contacted Counselor to make known that she was very sick with a migraine, unable to talk during our session today. Client plans to attend next session. Client noted that she was ok otherwise.   Hilbert Odor, LCSW

## 2020-06-29 ENCOUNTER — Other Ambulatory Visit (INDEPENDENT_AMBULATORY_CARE_PROVIDER_SITE_OTHER): Payer: Self-pay | Admitting: Family Medicine

## 2020-06-29 DIAGNOSIS — J309 Allergic rhinitis, unspecified: Secondary | ICD-10-CM

## 2020-06-30 ENCOUNTER — Encounter (INDEPENDENT_AMBULATORY_CARE_PROVIDER_SITE_OTHER): Payer: Self-pay

## 2020-06-30 NOTE — Telephone Encounter (Signed)
Message sent to pt.

## 2020-06-30 NOTE — Telephone Encounter (Signed)
Last OV with Dr Wallace 

## 2020-07-09 ENCOUNTER — Ambulatory Visit (INDEPENDENT_AMBULATORY_CARE_PROVIDER_SITE_OTHER): Payer: Medicare Other | Admitting: Family Medicine

## 2020-07-09 ENCOUNTER — Other Ambulatory Visit: Payer: Self-pay

## 2020-07-09 ENCOUNTER — Encounter (INDEPENDENT_AMBULATORY_CARE_PROVIDER_SITE_OTHER): Payer: Self-pay | Admitting: Family Medicine

## 2020-07-09 VITALS — BP 115/82 | HR 92 | Temp 98.4°F | Ht 65.0 in | Wt 248.0 lb

## 2020-07-09 DIAGNOSIS — Z6841 Body Mass Index (BMI) 40.0 and over, adult: Secondary | ICD-10-CM | POA: Diagnosis not present

## 2020-07-09 DIAGNOSIS — R52 Pain, unspecified: Secondary | ICD-10-CM

## 2020-07-09 DIAGNOSIS — R632 Polyphagia: Secondary | ICD-10-CM

## 2020-07-09 DIAGNOSIS — F3181 Bipolar II disorder: Secondary | ICD-10-CM

## 2020-07-09 DIAGNOSIS — E038 Other specified hypothyroidism: Secondary | ICD-10-CM

## 2020-07-09 DIAGNOSIS — E8881 Metabolic syndrome: Secondary | ICD-10-CM

## 2020-07-09 DIAGNOSIS — E66813 Obesity, class 3: Secondary | ICD-10-CM

## 2020-07-09 DIAGNOSIS — K5900 Constipation, unspecified: Secondary | ICD-10-CM | POA: Insufficient documentation

## 2020-07-09 DIAGNOSIS — E559 Vitamin D deficiency, unspecified: Secondary | ICD-10-CM

## 2020-07-09 DIAGNOSIS — Z79899 Other long term (current) drug therapy: Secondary | ICD-10-CM

## 2020-07-09 DIAGNOSIS — I10 Essential (primary) hypertension: Secondary | ICD-10-CM | POA: Diagnosis not present

## 2020-07-09 DIAGNOSIS — K9089 Other intestinal malabsorption: Secondary | ICD-10-CM

## 2020-07-09 DIAGNOSIS — R948 Abnormal results of function studies of other organs and systems: Secondary | ICD-10-CM

## 2020-07-09 DIAGNOSIS — R7303 Prediabetes: Secondary | ICD-10-CM

## 2020-07-09 DIAGNOSIS — Z9884 Bariatric surgery status: Secondary | ICD-10-CM

## 2020-07-10 ENCOUNTER — Other Ambulatory Visit (INDEPENDENT_AMBULATORY_CARE_PROVIDER_SITE_OTHER): Payer: Self-pay | Admitting: Family Medicine

## 2020-07-10 DIAGNOSIS — R600 Localized edema: Secondary | ICD-10-CM

## 2020-07-10 LAB — CBC WITH DIFFERENTIAL/PLATELET
Basophils Absolute: 0 10*3/uL (ref 0.0–0.2)
Basos: 0 %
EOS (ABSOLUTE): 0.2 10*3/uL (ref 0.0–0.4)
Eos: 2 %
Hematocrit: 38.4 % (ref 34.0–46.6)
Hemoglobin: 12.2 g/dL (ref 11.1–15.9)
Immature Grans (Abs): 0 10*3/uL (ref 0.0–0.1)
Immature Granulocytes: 0 %
Lymphocytes Absolute: 1.8 10*3/uL (ref 0.7–3.1)
Lymphs: 26 %
MCH: 28.6 pg (ref 26.6–33.0)
MCHC: 31.8 g/dL (ref 31.5–35.7)
MCV: 90 fL (ref 79–97)
Monocytes Absolute: 0.9 10*3/uL (ref 0.1–0.9)
Monocytes: 13 %
Neutrophils Absolute: 4.1 10*3/uL (ref 1.4–7.0)
Neutrophils: 59 %
Platelets: 294 10*3/uL (ref 150–450)
RBC: 4.27 x10E6/uL (ref 3.77–5.28)
RDW: 13.8 % (ref 11.7–15.4)
WBC: 6.9 10*3/uL (ref 3.4–10.8)

## 2020-07-10 LAB — LIPID PANEL
Chol/HDL Ratio: 1.6 ratio (ref 0.0–4.4)
Cholesterol, Total: 227 mg/dL — ABNORMAL HIGH (ref 100–199)
HDL: 142 mg/dL (ref >39)
LDL Chol Calc (NIH): 67 mg/dL (ref 0–99)
Triglycerides: 110 mg/dL (ref 0–149)
VLDL Cholesterol Cal: 18 mg/dL (ref 5–40)

## 2020-07-10 LAB — VITAMIN D 25 HYDROXY (VIT D DEFICIENCY, FRACTURES): Vit D, 25-Hydroxy: 17.9 ng/mL — ABNORMAL LOW (ref 30.0–100.0)

## 2020-07-10 LAB — COMPREHENSIVE METABOLIC PANEL
ALT: 13 IU/L (ref 0–32)
AST: 16 IU/L (ref 0–40)
Albumin/Globulin Ratio: 1.5 (ref 1.2–2.2)
Albumin: 4.2 g/dL (ref 3.8–4.9)
Alkaline Phosphatase: 67 IU/L (ref 44–121)
BUN/Creatinine Ratio: 24 — ABNORMAL HIGH (ref 9–23)
BUN: 19 mg/dL (ref 6–24)
Bilirubin Total: <0.2 mg/dL (ref 0.0–1.2)
CO2: 25 mmol/L (ref 20–29)
Calcium: 9.1 mg/dL (ref 8.7–10.2)
Chloride: 103 mmol/L (ref 96–106)
Creatinine, Ser: 0.8 mg/dL (ref 0.57–1.00)
GFR calc Af Amer: 95 mL/min/{1.73_m2} (ref >59)
GFR calc non Af Amer: 82 mL/min/{1.73_m2} (ref >59)
Globulin, Total: 2.8 g/dL (ref 1.5–4.5)
Glucose: 79 mg/dL (ref 65–99)
Potassium: 4.7 mmol/L (ref 3.5–5.2)
Sodium: 143 mmol/L (ref 134–144)
Total Protein: 7 g/dL (ref 6.0–8.5)

## 2020-07-10 LAB — HEMOGLOBIN A1C
Est. average glucose Bld gHb Est-mCnc: 100 mg/dL
Hgb A1c MFr Bld: 5.1 % (ref 4.8–5.6)

## 2020-07-10 LAB — MAGNESIUM: Magnesium: 2.2 mg/dL (ref 1.6–2.3)

## 2020-07-10 LAB — IRON,TIBC AND FERRITIN PANEL
Ferritin: 32 ng/mL (ref 15–150)
Iron Saturation: 35 % (ref 15–55)
Iron: 106 ug/dL (ref 27–159)
Total Iron Binding Capacity: 299 ug/dL (ref 250–450)
UIBC: 193 ug/dL (ref 131–425)

## 2020-07-10 LAB — TSH: TSH: 3.12 u[IU]/mL (ref 0.450–4.500)

## 2020-07-10 LAB — VALPROIC ACID LEVEL: Valproic Acid Lvl: 30 ug/mL — ABNORMAL LOW (ref 50–100)

## 2020-07-10 LAB — INSULIN, RANDOM: INSULIN: 8.8 u[IU]/mL (ref 2.6–24.9)

## 2020-07-10 LAB — VITAMIN B12: Vitamin B-12: 1446 pg/mL — ABNORMAL HIGH (ref 232–1245)

## 2020-07-10 MED ORDER — TRULICITY 3 MG/0.5ML ~~LOC~~ SOAJ: 3.0000 mg | SUBCUTANEOUS | 0 refills | Status: DC

## 2020-07-11 NOTE — Telephone Encounter (Signed)
Last OV with Dr Wallace 

## 2020-07-12 ENCOUNTER — Other Ambulatory Visit (INDEPENDENT_AMBULATORY_CARE_PROVIDER_SITE_OTHER): Payer: Self-pay | Admitting: Family Medicine

## 2020-07-12 DIAGNOSIS — E559 Vitamin D deficiency, unspecified: Secondary | ICD-10-CM

## 2020-07-12 MED ORDER — HYDROCHLOROTHIAZIDE 12.5 MG PO CAPS: 12.5000 mg | ORAL_CAPSULE | Freq: Every day | ORAL | 0 refills | Status: DC | PRN

## 2020-07-14 ENCOUNTER — Encounter (INDEPENDENT_AMBULATORY_CARE_PROVIDER_SITE_OTHER): Payer: Self-pay

## 2020-07-14 NOTE — Telephone Encounter (Signed)
DR Wallace 

## 2020-07-14 NOTE — Telephone Encounter (Signed)
Message sent to pt.

## 2020-07-15 NOTE — Progress Notes (Signed)
Chief Complaint:   OBESITY [name] is here to discuss her progress with her obesity treatment plan along with follow-up of her obesity related diagnoses.   Today's visit was #: 20 Starting weight: 250 lbs Starting date: 03/27/2019 Today's weight: 248 lbs Today's date: 07/09/2020 Total lbs lost to date: 2 lbs Body mass index is 41.27 kg/m.  Total weight loss percentage to date: -0.80%  Interim History: [name] reports new (acute) blurry vision.  She will see her eye doctor today.  Some double vision, lights seem more bright.  Positive for migraines.  She has pain - on oxycodone and Nuynta, possibly also on Lyrica, followed by the [facility].   Nutrition Plan: practicing portion control and making smarter food choices, such as increasing vegetables and decreasing simple carbohydrates with protein at each meal for 0% of the time. Anti-obesity medications: phentermine and Trulicity. Reported side effects: None. Activity: Walking/yoga for 40 minutes 2 times per week.  Assessment/Plan:   1. Essential hypertension At goal. Medications: HCTZ 12.5 mg daily (she is out of this).  She stopped taking one blood pressure medication on her own and is out of HCTZ.   Plan: Avoid buying foods that are: processed, frozen, or prepackaged to avoid excess salt. We will continue to monitor closely alongside her PCP and/or Specialist.  Regular follow up with PCP and specialists was also encouraged.   BP Readings from Last 3 Encounters:  07/09/20 115/82  06/19/20 114/78  04/28/20 112/77   Lab Results  Component Value Date   CREATININE 0.80 07/09/2020   - CBC with Differential/Platelet - Lipid panel  2. Pain [name] is taking oxycodone 10 mg twice daily and Nucynta 75 mg every other day.  She is not taking Lyrica.   3. Polyphagia Uncontrolled.  Current treatment: phentermine 15 mg daily.  She says that phentermine is not helpful.  She has a sweet tooth and has been eating more at night.   Note:  She recently had a steroid injection.  Polyphagia refers to excessive feelings of hunger.  Plan:  She will continue to focus on protein-rich, low simple carbohydrate foods. We reviewed the importance of hydration, regular exercise for stress reduction, and restorative sleep.  4. Metabolic syndrome She took her last Trulicity injection last Thursday.  Plan:  Will refill Trulicity 3 mg subcutaneously weekly, as per below.  Starting goal: Lose 7-10% of starting weight. She will continue to focus on protein-rich, low simple carbohydrate foods. We reviewed the importance of hydration, regular exercise for stress reduction, and restorative sleep.  We will continue to check lab work every 3 months, with 10% weight loss, or should any other concerns arise.  - Refill Dulaglutide (TRULICITY) 3 MG/0.5ML SOPN; Inject 3 mg as directed once a week.  Dispense: 2 mL; Refill: 0  5. History of gastric bypass Will refer [name] to [facility] today for follow-up on her gastric bypass.  - VITAMIN D 25 Hydroxy (Vit-D Deficiency, Fractures) - Vitamin B12 - Iron, TIBC and Ferritin Panel - Magnesium - Valproic Acid level - Ambulatory referral to General Surgery  6. Abnormal metabolism [name]'s metabolism is lower than normal.  7. Subclinical hypothyroidism Course: Stable. Medication: None.   Plan:  We will continue to monitor alongside Endocrinology/PCP as it relates to her weight loss journey.   Lab Results  Component Value Date   TSH 3.120 07/09/2020   - TSH  8. Prediabetes At goal. Goal is HgbA1c < 5.7.  Medication: Trulicity 3 mg  subcutaneously weekly.    Plan:  She will continue to focus on protein-rich, low simple carbohydrate foods. We reviewed the importance of hydration, regular exercise for stress reduction, and restorative sleep.   Lab Results  Component Value Date   HGBA1C 5.1 07/09/2020   Lab Results  Component Value Date   INSULIN 8.8 07/09/2020   INSULIN 8.7  12/20/2019   INSULIN 7.9 03/27/2019   - Comprehensive metabolic panel - Hemoglobin A1c - Insulin, random  9. Vitamin D deficiency Not at goal. Current vitamin D is 22.5, tested on 12/20/2019. Optimal goal > 50 ng/dL.   Plan: Continue to take prescription Vitamin D @50 ,000 IU every week as prescribed.  Will check vitamin D level today.  - VITAMIN D 25 Hydroxy (Vit-D Deficiency, Fractures)  10. Medication management Will check labs today, as per below..  - Vitamin B12 - Valproic Acid level  11. Other specified intestinal malabsorption [name] does not take multivitamins. She had gastric bypass in 2008.  Plan:  Check labs today.  - VITAMIN D 25 Hydroxy (Vit-D Deficiency, Fractures) - Vitamin B12 - Iron, TIBC and Ferritin Panel - Magnesium - Valproic Acid level  12. Class 3 severe obesity with serious comorbidity and body mass index (BMI) of 40.0 to 44.9 in adult, unspecified obesity type [facility])  Course: [name] is currently in the action stage of change. As such, her goal is to continue with weight loss efforts.   Nutrition goals: She has agreed to practicing portion control and making smarter food choices, such as increasing vegetables and decreasing simple carbohydrates.   Exercise goals: For substantial health benefits, adults should do at least 150 minutes (2 hours and 30 minutes) a week of moderate-intensity, or 75 minutes (1 hour and 15 minutes) a week of vigorous-intensity aerobic physical activity, or an equivalent combination of moderate- and vigorous-intensity aerobic activity. Aerobic activity should be performed in episodes of at least 10 minutes, and preferably, it should be spread throughout the week.  Behavioral modification strategies: increasing lean protein intake, decreasing simple carbohydrates, increasing vegetables and increasing water intake.  [name] has agreed to follow-up with our clinic in 2-3 weeks. She was informed of the importance of frequent  follow-up visits to maximize her success with intensive lifestyle modifications for her multiple health conditions.   [name] was informed we would discuss her lab results at her next visit unless there is a critical issue that needs to be addressed sooner. [name] agreed to keep her next visit at the agreed upon time to discuss these results.  Objective:   Blood pressure 115/82, pulse 92, temperature 98.4 F (36.9 C), temperature source Oral, height 5\' 5"  (1.651 m), weight 248 lb (112.5 kg), SpO2 96 %. Body mass index is 41.27 kg/m.  General: Cooperative, alert, well developed, in no acute distress. HEENT: Conjunctivae and lids unremarkable. Cardiovascular: Regular rhythm.  Lungs: Normal work of breathing. Neurologic: No focal deficits.   Lab Results  Component Value Date   CREATININE 0.80 07/09/2020   BUN 19 07/09/2020   NA 143 07/09/2020   K 4.7 07/09/2020   CL 103 07/09/2020   CO2 25 07/09/2020   Lab Results  Component Value Date   ALT 13 07/09/2020   AST 16 07/09/2020   ALKPHOS 67 07/09/2020   BILITOT <0.2 07/09/2020   Lab Results  Component Value Date   HGBA1C 5.1 07/09/2020   HGBA1C 5.6 12/20/2019   HGBA1C 5.5 06/27/2019   HGBA1C 5.9 (H) 03/27/2019   Lab Results  Component  Value Date   INSULIN 8.8 07/09/2020   INSULIN 8.7 12/20/2019   INSULIN 7.9 03/27/2019   Lab Results  Component Value Date   TSH 3.120 07/09/2020   Lab Results  Component Value Date   CHOL 227 (H) 07/09/2020   HDL 142 07/09/2020   LDLCALC 67 07/09/2020   TRIG 110 07/09/2020   CHOLHDL 1.6 07/09/2020   Lab Results  Component Value Date   WBC 6.9 07/09/2020   HGB 12.2 07/09/2020   HCT 38.4 07/09/2020   MCV 90 07/09/2020   PLT 294 07/09/2020   Lab Results  Component Value Date   IRON 106 07/09/2020   TIBC 299 07/09/2020   FERRITIN 32 07/09/2020   Obesity Behavioral Intervention:   Approximately 15 minutes were spent on the discussion below.  ASK: We discussed the  diagnosis of obesity with [name] today and [name] agreed to give [name] permission to discuss obesity behavioral modification therapy today.  ASSESS: [name] has the diagnosis of obesity and her BMI today is 41.4. [name] is in the action stage of change.   ADVISE: Mildreth was educated on the multiple health risks of obesity as well as the benefit of weight loss to improve her health. She was advised of the need for long term treatment and the importance of lifestyle modifications to improve her current health and to decrease her risk of future health problems.  AGREE: Multiple dietary modification options and treatment options were discussed and [name] agreed to follow the recommendations documented in the above note.  ARRANGE: [name] was educated on the importance of frequent visits to treat obesity as outlined per CMS and USPSTF guidelines and agreed to schedule her next follow up appointment today.  Attestation Statements:   Reviewed by clinician on day of visit: allergies, medications, problem list, medical history, surgical history, family history, social history, and previous encounter notes.  I, Insurance claims handler, CMA, am acting as transcriptionist for Helane Rima, DO  I have reviewed the above documentation for accuracy and completeness, and I agree with the above. Helane Rima, DO

## 2020-07-31 ENCOUNTER — Telehealth (HOSPITAL_COMMUNITY): Payer: Medicare Other | Admitting: Psychiatry

## 2020-08-03 ENCOUNTER — Other Ambulatory Visit (INDEPENDENT_AMBULATORY_CARE_PROVIDER_SITE_OTHER): Payer: Self-pay | Admitting: Family Medicine

## 2020-08-03 DIAGNOSIS — R600 Localized edema: Secondary | ICD-10-CM

## 2020-08-04 ENCOUNTER — Encounter (INDEPENDENT_AMBULATORY_CARE_PROVIDER_SITE_OTHER): Payer: Self-pay

## 2020-08-04 NOTE — Telephone Encounter (Signed)
Last OV with Dr Wallace 

## 2020-08-04 NOTE — Telephone Encounter (Signed)
Message sent to pt.

## 2020-08-12 ENCOUNTER — Encounter (INDEPENDENT_AMBULATORY_CARE_PROVIDER_SITE_OTHER): Payer: Self-pay | Admitting: Family Medicine

## 2020-08-12 ENCOUNTER — Ambulatory Visit (INDEPENDENT_AMBULATORY_CARE_PROVIDER_SITE_OTHER): Payer: Medicare Other | Admitting: Family Medicine

## 2020-08-12 ENCOUNTER — Other Ambulatory Visit: Payer: Self-pay

## 2020-08-12 VITALS — BP 94/65 | HR 95 | Temp 98.1°F | Ht 65.0 in | Wt 240.0 lb

## 2020-08-12 DIAGNOSIS — R350 Frequency of micturition: Secondary | ICD-10-CM

## 2020-08-12 DIAGNOSIS — G8929 Other chronic pain: Secondary | ICD-10-CM

## 2020-08-12 DIAGNOSIS — F3289 Other specified depressive episodes: Secondary | ICD-10-CM | POA: Diagnosis not present

## 2020-08-12 DIAGNOSIS — Z6839 Body mass index (BMI) 39.0-39.9, adult: Secondary | ICD-10-CM | POA: Diagnosis not present

## 2020-08-12 DIAGNOSIS — K5903 Drug induced constipation: Secondary | ICD-10-CM

## 2020-08-12 DIAGNOSIS — R8290 Unspecified abnormal findings in urine: Secondary | ICD-10-CM

## 2020-08-14 ENCOUNTER — Telehealth (HOSPITAL_COMMUNITY): Payer: Self-pay | Admitting: Psychiatry

## 2020-08-14 ENCOUNTER — Other Ambulatory Visit (HOSPITAL_COMMUNITY): Payer: Self-pay | Admitting: Psychiatry

## 2020-08-14 ENCOUNTER — Other Ambulatory Visit: Payer: Self-pay

## 2020-08-14 ENCOUNTER — Telehealth (HOSPITAL_COMMUNITY): Payer: Medicare Other | Admitting: Psychiatry

## 2020-08-14 DIAGNOSIS — G4700 Insomnia, unspecified: Secondary | ICD-10-CM

## 2020-08-14 DIAGNOSIS — F419 Anxiety disorder, unspecified: Secondary | ICD-10-CM

## 2020-08-14 DIAGNOSIS — T43225A Adverse effect of selective serotonin reuptake inhibitors, initial encounter: Secondary | ICD-10-CM

## 2020-08-14 DIAGNOSIS — F3181 Bipolar II disorder: Secondary | ICD-10-CM

## 2020-08-14 MED ORDER — CLONAZEPAM 0.5 MG PO TABS: 0.5000 mg | ORAL_TABLET | Freq: Three times a day (TID) | ORAL | 0 refills | Status: DC | PRN

## 2020-08-14 MED ORDER — BUPROPION HCL ER (XL) 300 MG PO TB24: 300.0000 mg | ORAL_TABLET | Freq: Every day | ORAL | 0 refills | Status: DC

## 2020-08-14 MED ORDER — DOXEPIN HCL 75 MG PO CAPS: 75.0000 mg | ORAL_CAPSULE | Freq: Every day | ORAL | 0 refills | Status: DC

## 2020-08-14 MED ORDER — DIVALPROEX SODIUM ER 250 MG PO TB24: 500.0000 mg | ORAL_TABLET | Freq: Two times a day (BID) | ORAL | 0 refills | Status: DC

## 2020-08-14 MED ORDER — PRAZOSIN HCL 1 MG PO CAPS: 1.0000 mg | ORAL_CAPSULE | Freq: Every day | ORAL | 0 refills | Status: DC

## 2020-08-14 MED ORDER — LINACLOTIDE 145 MCG PO CAPS: 145.0000 ug | ORAL_CAPSULE | Freq: Every day | ORAL | 0 refills | Status: DC | PRN

## 2020-08-14 NOTE — Telephone Encounter (Signed)
I called the patient at our scheduled appointment time. There was no answer. I left a voice message for patient to call the clinic back at their convinence.   

## 2020-08-15 ENCOUNTER — Telehealth (HOSPITAL_COMMUNITY): Payer: Self-pay | Admitting: *Deleted

## 2020-08-15 NOTE — Telephone Encounter (Signed)
Pt called c/o markedly decreased sleep, 2-3 hours per night, as well as "terrible nightmares" and increased anxiety. Pt is asking if there is some other medication that she may try to help alleviate these issues. Pt does have an appointment upcoming on 09/25/20. Please review and advise. Thanks.

## 2020-08-16 LAB — URINE CULTURE

## 2020-08-16 LAB — URINALYSIS, ROUTINE W REFLEX MICROSCOPIC
Bilirubin, UA: NEGATIVE
Glucose, UA: NEGATIVE
Ketones, UA: NEGATIVE
Leukocytes,UA: NEGATIVE
Nitrite, UA: NEGATIVE
Protein,UA: NEGATIVE
RBC, UA: NEGATIVE
Specific Gravity, UA: 1.016 (ref 1.005–1.030)
Urobilinogen, Ur: 0.2 mg/dL (ref 0.2–1.0)
pH, UA: 6 (ref 5.0–7.5)

## 2020-08-20 NOTE — Progress Notes (Signed)
Chief Complaint:   OBESITY [name] is here to discuss her progress with her obesity treatment plan along with follow-up of her obesity related diagnoses.   Today's visit was #: 21 Starting weight: 250 lbs Starting date: 03/27/2019 Today's weight: 240 lbs Today's date: 08/12/2020 Total lbs lost to date: 10 lbs Body mass index is 39.94 kg/m.  Total weight loss percentage to date: -4.00%  Interim History:  [name] says that since COVID, she still wheezes at night and her ears have been ringing.  She endorses constipation.  She reports urinary retention and odor.  She says she drank 2 beers and it helped.  Endorses gas.  Takes Nucynta 75 mg three times daily, oxycodone 10/325 one to two tablets per day if needed.  Current Meal Plan: practicing portion control and making smarter food choices, such as increasing vegetables and decreasing simple carbohydrates for 0% of the time.  Current Exercise Plan: None. Current Anti-Obesity Medications: Trulicity and phentermine. Side effects: None.  Assessment/Plan:   1. Drug-induced constipation Start Linzess 145 mcg daily as needed for constipation.  - Start linaclotide (LINZESS) 145 MCG CAPS capsule; Take 1 capsule (145 mcg total) by mouth daily as needed.  Dispense: 30 capsule; Refill: 0  2. Malodorous urine With urinary frequency.  Will check UA and culture today, as per below.  - Urine Culture - Urinalysis, Routine w reflex microscopic  3. Other chronic pain Doing well with 10/325 mg oxy/APAP 1-2 tablets daily. We will continue to monitor symptoms as they relate to her weight loss journey.  4. Other depression, with emotional eating Not at goal. Medication: Trulicity, Wellbutrin, phentermine.    Plan:  Continue medications. Behavior modification techniques were discussed today to help deal with emotional/non-hunger eating behaviors.  5. Class 2 severe obesity with serious comorbidity and body mass index (BMI) of 39.0 to 39.9 in  adult, unspecified obesity type [name])  Course: [name] is currently in the action stage of change. As such, her goal is to continue with weight loss efforts.   Nutrition goals: She has agreed to practicing portion control and making smarter food choices, such as increasing vegetables and decreasing simple carbohydrates.   Exercise goals: All adults should avoid inactivity. Some physical activity is better than none, and adults who participate in any amount of physical activity gain some health benefits.  Behavioral modification strategies: increasing lean protein intake, decreasing simple carbohydrates, increasing vegetables, increasing water intake and decreasing liquid calories.  [name] has agreed to follow-up with our clinic in 4 weeks. She was informed of the importance of frequent follow-up visits to maximize her success with intensive lifestyle modifications for her multiple health conditions.   Objective:   Blood pressure 94/65, pulse 95, temperature 98.1 F (36.7 C), temperature source Oral, height 5\' 5"  (1.651 m), weight 240 lb (108.9 kg), SpO2 99 %. Body mass index is 39.94 kg/m.  General: Cooperative, alert, well developed, in no acute distress. HEENT: Conjunctivae and lids unremarkable. Cardiovascular: Regular rhythm.  Lungs: Normal work of breathing. Neurologic: No focal deficits.   Lab Results  Component Value Date   CREATININE 0.80 07/09/2020   BUN 19 07/09/2020   NA 143 07/09/2020   K 4.7 07/09/2020   CL 103 07/09/2020   CO2 25 07/09/2020   Lab Results  Component Value Date   ALT 13 07/09/2020   AST 16 07/09/2020   ALKPHOS 67 07/09/2020   BILITOT <0.2 07/09/2020   Lab Results  Component Value Date   HGBA1C  5.1 07/09/2020   HGBA1C 5.6 12/20/2019   HGBA1C 5.5 06/27/2019   HGBA1C 5.9 (H) 03/27/2019   Lab Results  Component Value Date   INSULIN 8.8 07/09/2020   INSULIN 8.7 12/20/2019   INSULIN 7.9 03/27/2019   Lab Results  Component Value Date    TSH 3.120 07/09/2020   Lab Results  Component Value Date   CHOL 227 (H) 07/09/2020   HDL 142 07/09/2020   LDLCALC 67 07/09/2020   TRIG 110 07/09/2020   CHOLHDL 1.6 07/09/2020   Lab Results  Component Value Date   WBC 6.9 07/09/2020   HGB 12.2 07/09/2020   HCT 38.4 07/09/2020   MCV 90 07/09/2020   PLT 294 07/09/2020   Lab Results  Component Value Date   IRON 106 07/09/2020   TIBC 299 07/09/2020   FERRITIN 32 07/09/2020   Obesity Behavioral Intervention:   Approximately 15 minutes were spent on the discussion below.  ASK: We discussed the diagnosis of obesity with [place] today and Dhani agreed to give [name] permission to discuss obesity behavioral modification therapy today.  ASSESS: Elin has the diagnosis of obesity and her BMI today is 39.9. [name] is in the action stage of change.   ADVISE: [name] was educated on the multiple health risks of obesity as well as the benefit of weight loss to improve her health. She was advised of the need for long term treatment and the importance of lifestyle modifications to improve her current health and to decrease her risk of future health problems.  AGREE: Multiple dietary modification options and treatment options were discussed and [name] agreed to follow the recommendations documented in the above note.  ARRANGE: [name] was educated on the importance of frequent visits to treat obesity as outlined per CMS and USPSTF guidelines and agreed to schedule her next follow up appointment today.  Attestation Statements:   Reviewed by clinician on day of visit: allergies, medications, problem list, medical history, surgical history, family history, social history, and previous encounter notes.  I, Insurance claims handler, CMA, am acting as transcriptionist for Helane Rima, DO  I have reviewed the above documentation for accuracy and completeness, and I agree with the above. Helane Rima, DO

## 2020-08-23 ENCOUNTER — Other Ambulatory Visit (INDEPENDENT_AMBULATORY_CARE_PROVIDER_SITE_OTHER): Payer: Self-pay | Admitting: Family Medicine

## 2020-08-23 DIAGNOSIS — J309 Allergic rhinitis, unspecified: Secondary | ICD-10-CM

## 2020-08-25 ENCOUNTER — Ambulatory Visit (HOSPITAL_COMMUNITY): Payer: Medicare Other | Admitting: Psychiatry

## 2020-08-25 ENCOUNTER — Other Ambulatory Visit: Payer: Self-pay

## 2020-08-25 NOTE — Telephone Encounter (Signed)
Dr.Wallace °

## 2020-08-30 ENCOUNTER — Other Ambulatory Visit (INDEPENDENT_AMBULATORY_CARE_PROVIDER_SITE_OTHER): Payer: Self-pay | Admitting: Family Medicine

## 2020-08-30 DIAGNOSIS — R600 Localized edema: Secondary | ICD-10-CM

## 2020-08-31 ENCOUNTER — Telehealth (INDEPENDENT_AMBULATORY_CARE_PROVIDER_SITE_OTHER): Payer: Self-pay | Admitting: Family Medicine

## 2020-08-31 DIAGNOSIS — E559 Vitamin D deficiency, unspecified: Secondary | ICD-10-CM

## 2020-09-01 NOTE — Telephone Encounter (Signed)
Dr.Wallace °

## 2020-09-01 NOTE — Telephone Encounter (Signed)
Pt last seen by Dr. Wallace.  

## 2020-09-02 ENCOUNTER — Ambulatory Visit (INDEPENDENT_AMBULATORY_CARE_PROVIDER_SITE_OTHER): Payer: Medicare Other | Admitting: Adult Health

## 2020-09-03 ENCOUNTER — Encounter (INDEPENDENT_AMBULATORY_CARE_PROVIDER_SITE_OTHER): Payer: Self-pay

## 2020-09-03 NOTE — Telephone Encounter (Signed)
Dr.Wallace °

## 2020-09-04 MED ORDER — VITAMIN D (ERGOCALCIFEROL) 1.25 MG (50000 UNIT) PO CAPS: 50000.0000 [IU] | ORAL_CAPSULE | ORAL | 0 refills | Status: DC

## 2020-09-04 NOTE — Addendum Note (Signed)
Addended by: Scarlett Presto on: 09/04/2020 11:51 AM   Modules accepted: Orders

## 2020-09-05 ENCOUNTER — Other Ambulatory Visit (INDEPENDENT_AMBULATORY_CARE_PROVIDER_SITE_OTHER): Payer: Self-pay | Admitting: Family Medicine

## 2020-09-05 DIAGNOSIS — E8881 Metabolic syndrome: Secondary | ICD-10-CM

## 2020-09-15 ENCOUNTER — Encounter (HOSPITAL_COMMUNITY): Payer: Self-pay | Admitting: Psychiatry

## 2020-09-15 ENCOUNTER — Other Ambulatory Visit: Payer: Self-pay

## 2020-09-15 ENCOUNTER — Ambulatory Visit (INDEPENDENT_AMBULATORY_CARE_PROVIDER_SITE_OTHER): Payer: Medicare Other | Admitting: Psychiatry

## 2020-09-15 ENCOUNTER — Ambulatory Visit (INDEPENDENT_AMBULATORY_CARE_PROVIDER_SITE_OTHER): Payer: Medicare Other | Admitting: Family Medicine

## 2020-09-15 ENCOUNTER — Encounter (INDEPENDENT_AMBULATORY_CARE_PROVIDER_SITE_OTHER): Payer: Self-pay | Admitting: Family Medicine

## 2020-09-15 VITALS — BP 111/76 | HR 93 | Temp 98.0°F | Ht 65.0 in | Wt 237.0 lb

## 2020-09-15 DIAGNOSIS — K5903 Drug induced constipation: Secondary | ICD-10-CM | POA: Diagnosis not present

## 2020-09-15 DIAGNOSIS — Z6841 Body Mass Index (BMI) 40.0 and over, adult: Secondary | ICD-10-CM

## 2020-09-15 DIAGNOSIS — F419 Anxiety disorder, unspecified: Secondary | ICD-10-CM | POA: Diagnosis not present

## 2020-09-15 DIAGNOSIS — F3181 Bipolar II disorder: Secondary | ICD-10-CM

## 2020-09-15 DIAGNOSIS — E8881 Metabolic syndrome: Secondary | ICD-10-CM | POA: Diagnosis not present

## 2020-09-15 NOTE — Progress Notes (Signed)
Virtual Visit via Video Note  I connected with [name] on 09/15/20 at  1:30 PM EDT by a video enabled telemedicine application and verified that I am speaking with the correct person using two identifiers.  Location: Patient: Patient Home Provider: Home Office  I discussed the limitations of evaluation and management by telemedicine and the availability of in person appointments. The patient expressed understanding and agreed to proceed.  History of Present Illness: Bipolar 2 DO and Anxiety  Treatment Plan Goals: 1)Margueritte would like to improve boundary setting and communication skills with significant other to more clearly define the relationship and reduce negative impacts on mental health. 2)Hanan would like to improve self esteem through learning and applying CBT skills to address cognitive distortions.  Observations/Objective: Counselor met with Client for individual therapy via Webex. Counselor assessed MH symptoms and progress on treatment plan goals, with patient reportingthatshe does not feel like having a full session today, as she is currently grieving her mother, who passed, birthday day today. Client states she continues to continue from [place] and continues to have related symptoms that impact her overall functioning. Client presents withmoderatedepression andmildanxiety. Client denies SI/HI or self-harm.  Goal 1: Counselor assessed progress towards the goal of setting appropriate boundaries with significant other. Client states that their has been significant improvements in these areas. Client shared a variety of examples of how the roles and responsibilities have shifted since she has been out of work and recovering from physical health symptoms. Counselor prompted Client to share how boundaries have been set and kept for her own self-care and mental stability. Client reports feeling better about changes made and the new balance in their  relationship.   Goal 2: Counselor processed and assessed CBT skill application in efforts for Client to improve self-esteem. Client shared about how missing her mother and mother's encouraging words is negatively impacting her self-esteem, along with slow recovery efforts from [place]. Counselor previewed skills and schedule next session with Client.   Assessment and Plan: Counselor will continue to meet with patient to address treatment plan goals. Patient will continue to follow recommendations of providers and implement skills learned in session.  Follow Up Instructions: Counselor will send information for next session via Webex.   The patient was advised to call back or seek an in-person evaluation if the symptoms worsen or if the condition fails to improve as anticipated.  I provided45mnutes of non-face-to-face time during this encounter.   BLise Auer LCSW

## 2020-09-16 NOTE — Progress Notes (Signed)
Chief Complaint:   OBESITY [name] is here to discuss her progress with her obesity treatment plan along with [name] of her obesity related diagnoses.   Today's visit was #: 22 Starting weight: 250 lbs Starting date: 03/27/2019 Today's weight: 237 lbs Today's date: 09/15/2020 Total lbs lost to date: 13 lbs Body mass index is 39.44 kg/m.  Total weight loss percentage to date: -5.20%  Interim History:  [name] has an appointment with Psychiatry today.Plan:  Protein shakes, multivitamin. Current Meal Plan: practicing portion control and making smarter food choices, such as increasing vegetables and decreasing simple carbohydrates for 0% of the time.  Current Exercise Plan: None. Current Anti-Obesity Medications: phentermine 15 mg daily, Trulicity 3 mg subcutaneously weekly. Side effects: None.  Assessment/Plan:   1. Drug-induced constipation This problem is improving. [name] was informed that a decrease in bowel movement frequency is normal while losing weight, but stools should not be hard or painful.    Plan:  Continue Linzess 145 mcg daily, increase hydration.  Bowel regimen reviewed.  Counseling: Getting to Good Bowel Health: Your goal is to have one soft bowel movement each day. Drink at least 8 glasses of water each day. Eat plenty of fiber (goal is over 30 grams each day). It is best to get most of your fiber from dietary sources which includes leafy green vegetables, fresh fruit, and whole grains. You may need to add fiber with the help of OTC fiber supplements. These include Metamucil, Citrucel, and Benefiber. If you are still having trouble, try adding an osmotic laxative such as Miralax. If all of these changes do not work, Dietitian.   2. Metabolic syndrome Starting goal: Lose 7-10% of starting weight. She will continue to focus on protein-rich, low simple carbohydrate foods. We reviewed the importance of hydration, regular exercise for stress reduction,  and restorative sleep.  We will continue to check lab work every 3 months, with 10% weight loss, or should any other concerns arise.  3. 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 Anniversary of mother's death. Followed by Dr. Michae Kava for this.  She has an appointment today.  She also has a therapist. We will continue to monitor symptoms as they relate to her weight loss journey.  4. Obesity, current BMI 39.5  Course: [name]. As such, her goal is to continue with weight loss efforts.   Nutrition goals: She has [name] to practicing portion control and making smarter food choices, such as increasing vegetables and decreasing simple carbohydrates.   Exercise goals: All adults should avoid inactivity. Some physical activity is better than none, and adults who participate in any amount of physical activity gain some health benefits.  Behavioral modification strategies: increasing lean protein intake, decreasing simple carbohydrates, increasing vegetables and increasing water intake.  [name] in 4 weeks. She was informed of the importance of frequent [name] visits to maximize her success with intensive lifestyle modifications for her multiple health conditions.   Objective:   Blood pressure 111/76, pulse 93, temperature 98 F (36.7 C), temperature source Oral, height 5\' 5"  (1.651 m), weight 237 lb (107.5 kg), SpO2 96 %. Body mass index is 39.44 kg/m.  General: Cooperative, alert, well developed, in no acute distress. HEENT: Conjunctivae and lids unremarkable. Cardiovascular: Regular rhythm.  Lungs: Normal work of breathing. Neurologic: No focal deficits.   Lab Results  Component Value Date   CREATININE  0.80 07/09/2020   BUN 19 07/09/2020   NA 143 07/09/2020   K 4.7 07/09/2020   CL 103 07/09/2020   CO2 25 07/09/2020   Lab Results  Component Value Date   ALT 13  07/09/2020   AST 16 07/09/2020   ALKPHOS 67 07/09/2020   BILITOT <0.2 07/09/2020   Lab Results  Component Value Date   HGBA1C 5.1 07/09/2020   HGBA1C 5.6 12/20/2019   HGBA1C 5.5 06/27/2019   HGBA1C 5.9 (H) 03/27/2019   Lab Results  Component Value Date   INSULIN 8.8 07/09/2020   INSULIN 8.7 12/20/2019   INSULIN 7.9 03/27/2019   Lab Results  Component Value Date   TSH 3.120 07/09/2020   Lab Results  Component Value Date   CHOL 227 (H) 07/09/2020   HDL 142 07/09/2020   LDLCALC 67 07/09/2020   TRIG 110 07/09/2020   CHOLHDL 1.6 07/09/2020   Lab Results  Component Value Date   WBC 6.9 07/09/2020   HGB 12.2 07/09/2020   HCT 38.4 07/09/2020   MCV 90 07/09/2020   PLT 294 07/09/2020   Lab Results  Component Value Date   IRON 106 07/09/2020   TIBC 299 07/09/2020   FERRITIN 32 07/09/2020   Obesity Behavioral Intervention:   Approximately 15 minutes were spent on the discussion below.  ASK: We discussed the diagnosis of obesity with [place] today and Erma [name] to give [name] permission to discuss obesity behavioral modification therapy today.  ASSESS: [name] has the diagnosis of obesity and her BMI today is 39.5. [name] is in the action stage of [name].   ADVISE: [name] was educated on the multiple health risks of obesity as well as the benefit of weight loss to improve her health. She was advised of the need for long term treatment and the importance of lifestyle modifications to improve her current health and to decrease her risk of future health problems.  AGREE: Multiple dietary modification options and treatment options were discussed and [name] [name] to follow the recommendations documented in the above note.  ARRANGE: [name] was educated on the importance of frequent visits to treat obesity as outlined per CMS and USPSTF guidelines and [name] to schedule her next follow up appointment today.  Attestation Statements:   Reviewed by clinician on day of  visit: allergies, medications, problem list, medical history, surgical history, family history, social history, and previous encounter notes.  I, Insurance claims handler, CMA, am acting as transcriptionist for Helane Rima, DO  I have reviewed the above documentation for accuracy and completeness, and I agree with the above. Helane Rima, DO

## 2020-09-18 ENCOUNTER — Telehealth (INDEPENDENT_AMBULATORY_CARE_PROVIDER_SITE_OTHER): Payer: Medicare Other | Admitting: Psychiatry

## 2020-09-18 ENCOUNTER — Other Ambulatory Visit: Payer: Self-pay

## 2020-09-18 ENCOUNTER — Encounter (HOSPITAL_COMMUNITY): Payer: Self-pay | Admitting: Psychiatry

## 2020-09-18 ENCOUNTER — Other Ambulatory Visit (INDEPENDENT_AMBULATORY_CARE_PROVIDER_SITE_OTHER): Payer: Self-pay | Admitting: Family Medicine

## 2020-09-18 DIAGNOSIS — G4700 Insomnia, unspecified: Secondary | ICD-10-CM | POA: Diagnosis not present

## 2020-09-18 DIAGNOSIS — F4001 Agoraphobia with panic disorder: Secondary | ICD-10-CM

## 2020-09-18 DIAGNOSIS — F3181 Bipolar II disorder: Secondary | ICD-10-CM | POA: Diagnosis not present

## 2020-09-18 DIAGNOSIS — F411 Generalized anxiety disorder: Secondary | ICD-10-CM

## 2020-09-18 DIAGNOSIS — R600 Localized edema: Secondary | ICD-10-CM

## 2020-09-18 MED ORDER — DOXEPIN HCL 100 MG PO CAPS: 100.0000 mg | ORAL_CAPSULE | Freq: Every day | ORAL | 0 refills | Status: DC

## 2020-09-18 MED ORDER — BUPROPION HCL ER (XL) 300 MG PO TB24: 300.0000 mg | ORAL_TABLET | Freq: Every day | ORAL | 0 refills | Status: DC

## 2020-09-18 MED ORDER — DIVALPROEX SODIUM ER 500 MG PO TB24: 500.0000 mg | ORAL_TABLET | Freq: Two times a day (BID) | ORAL | 0 refills | Status: DC

## 2020-09-18 MED ORDER — CLONAZEPAM 0.5 MG PO TABS: 0.5000 mg | ORAL_TABLET | Freq: Three times a day (TID) | ORAL | 1 refills | Status: DC | PRN

## 2020-09-18 MED ORDER — PRAZOSIN HCL 2 MG PO CAPS: 2.0000 mg | ORAL_CAPSULE | Freq: Every day | ORAL | 0 refills | Status: DC

## 2020-09-18 NOTE — Progress Notes (Signed)
Virtual Visit via Telephone Note  I connected with [name] on 09/18/20 at  8:30 AM EDT by telephone and verified that I am speaking with the correct person using two identifiers. We were unable to connect by video.  Location: Patient: home Provider: office   I discussed the limitations, risks, security and privacy concerns of performing an evaluation and management service by telephone and the availability of in person appointments. I also discussed with the patient that there may be a patient responsible charge related to this service. The patient expressed understanding and agreed to proceed.   History of Present Illness: [name] are always hard for her. Her mother passed on Oct 05, 2022 and she grieves. These last for nights she has not slept more than 4 hrs even with Doxepin and Melatonin. She is having bad nights. Her son has been living for the past 2 months and she is supporting him financially as well. Her daughter is not talking to her. She is having car troubles and therefore has to rely on her son for transportation.  This is a lot of stress. She is anxious and irritable. She is snapping at everyone around her. [name] would rather be by herself. She has to take Klonopin before going out and it helps a little. Her anxiety is high and she gets hives on/off thru out the day. [name] is having frequent, intense panic attacks. She uses deep breathing to help calm her but it is not always effective. She has had 6 this month, where as, last month it was 3. On days where her depression is bad she isolates, spending all day, barely eating or bathing. She will not answer her phone or door. It can last 4-5 days at a time. On other days she is active and doing chores. She sometimes wonders she has to suffer so much. She denies thoughts of death and denies SI/HI. Pt denies recent manic and hypomanic symptoms including periods of decreased need for sleep, increased energy, mood lability, impulsivity,  FOI, and excessive spending.    Observations/Objective:  General Appearance: unable to assess  Eye Contact:  unable to assess  Speech:  Clear and Coherent and Normal Rate  Volume:  Decreased  Mood:  Anxious and Depressed  Affect:  Congruent  Thought Process:  Goal Directed, Linear and Descriptions of Associations: Intact  Orientation:  Full (Time, Place, and Person)  Thought Content:  Logical  Suicidal Thoughts:  No  Homicidal Thoughts:  No  Memory:  Immediate;   Good  Judgement:  Good  Insight:  Good  Psychomotor Activity: unable to assess  Concentration:  Concentration: Good  Recall:  Good  Fund of Knowledge:  Good  Language:  Good  Akathisia:  unable to assess  Handed:  Right  AIMS (if indicated):     Assets:  Communication Skills Desire for Improvement Financial Resources/Insurance Housing Resilience Social Support Talents/Skills Vocational/Educational  ADL's:  unable to assess  Cognition:  WNL  Sleep:         Assessment and Plan: Depression screen [facility] 2/9 09/18/2020 03/27/2019  Decreased Interest 3 3  Down, Depressed, Hopeless 3 3  PHQ - 2 Score 6 6  Altered sleeping 3 3  Tired, decreased energy 3 3  Change in appetite 3 3  Feeling bad or failure about yourself  3 3  Trouble concentrating 3 3  Moving slowly or fidgety/restless - 3  Suicidal thoughts 0 2  PHQ-9 Score 21 26  Difficult doing work/chores Extremely  dIfficult Extremely dIfficult  Some recent data might be hidden    Flowsheet Row Video Visit from [facility]/18/2020 in [facility]  C-SSRS RISK CATEGORY Error: Q3, 4, or 5 should not be populated when Q2 is No     -increase Prazosin for nightmares - increase Doxepin for depression and insomnia  1. Bipolar II disorder (HCC) - doxepin (SINEQUAN) 100 MG capsule; Take 1 capsule (100 mg total) by mouth at bedtime.  Dispense: 90 capsule; Refill: 0 - divalproex (DEPAKOTE ER) 500 MG 24 hr tablet; Take 1 tablet (500  mg total) by mouth in the morning and at bedtime.  Dispense: 180 tablet; Refill: 0 - buPROPion (WELLBUTRIN XL) 300 MG 24 hr tablet; Take 1 tablet (300 mg total) by mouth daily.  Dispense: 90 tablet; Refill: 0  2. Insomnia, unspecified type - prazosin (MINIPRESS) 2 MG capsule; Take 1 capsule (2 mg total) by mouth at bedtime.  Dispense: 90 capsule; Refill: 0 - doxepin (SINEQUAN) 100 MG capsule; Take 1 capsule (100 mg total) by mouth at bedtime.  Dispense: 90 capsule; Refill: 0  3. Panic disorder with agoraphobia - clonazePAM (KLONOPIN) 0.5 MG tablet; Take 1 tablet (0.5 mg total) by mouth 3 (three) times daily as needed.  Dispense: 90 tablet; Refill: 1  4. GAD (generalized anxiety disorder) - clonazePAM (KLONOPIN) 0.5 MG tablet; Take 1 tablet (0.5 mg total) by mouth 3 (three) times daily as needed.  Dispense: 90 tablet; Refill: 1 - doxepin (SINEQUAN) 100 MG capsule; Take 1 capsule (100 mg total) by mouth at bedtime.  Dispense: 90 capsule; Refill: 0    Follow Up Instructions: In 4-6  weeks or sooner if needed   I discussed the assessment and treatment plan with the patient. The patient was provided an opportunity to ask questions and all were answered. The patient agreed with the plan and demonstrated an understanding of the instructions.   The patient was advised to call back or seek an in-person evaluation if the symptoms worsen or if the condition fails to improve as anticipated.  I provided 23 minutes of non-face-to-face time during this encounter.   Oletta Darter, MD

## 2020-09-22 ENCOUNTER — Other Ambulatory Visit (INDEPENDENT_AMBULATORY_CARE_PROVIDER_SITE_OTHER): Payer: Self-pay | Admitting: Family Medicine

## 2020-09-22 DIAGNOSIS — K5903 Drug induced constipation: Secondary | ICD-10-CM

## 2020-09-22 NOTE — Telephone Encounter (Signed)
Pt last seen by Dr. Wallace.  

## 2020-09-23 ENCOUNTER — Encounter (INDEPENDENT_AMBULATORY_CARE_PROVIDER_SITE_OTHER): Payer: Self-pay

## 2020-09-23 ENCOUNTER — Other Ambulatory Visit (INDEPENDENT_AMBULATORY_CARE_PROVIDER_SITE_OTHER): Payer: Self-pay | Admitting: Family Medicine

## 2020-09-23 DIAGNOSIS — E8881 Metabolic syndrome: Secondary | ICD-10-CM

## 2020-09-23 NOTE — Telephone Encounter (Signed)
Message sent to pt.

## 2020-09-23 NOTE — Telephone Encounter (Signed)
Dr.Wallace °

## 2020-09-24 ENCOUNTER — Other Ambulatory Visit (INDEPENDENT_AMBULATORY_CARE_PROVIDER_SITE_OTHER): Payer: Self-pay | Admitting: Family Medicine

## 2020-09-24 DIAGNOSIS — J309 Allergic rhinitis, unspecified: Secondary | ICD-10-CM

## 2020-09-24 MED ORDER — TRULICITY 3 MG/0.5ML ~~LOC~~ SOAJ: 3.0000 mg | SUBCUTANEOUS | 0 refills | Status: DC

## 2020-09-24 NOTE — Telephone Encounter (Signed)
Pt last seen by Dr. Wallace.  

## 2020-09-25 ENCOUNTER — Telehealth (HOSPITAL_COMMUNITY): Payer: Medicare Other | Admitting: Psychiatry

## 2020-09-28 ENCOUNTER — Other Ambulatory Visit (HOSPITAL_COMMUNITY): Payer: Self-pay | Admitting: Psychiatry

## 2020-09-28 DIAGNOSIS — T43225A Adverse effect of selective serotonin reuptake inhibitors, initial encounter: Secondary | ICD-10-CM

## 2020-09-29 ENCOUNTER — Ambulatory Visit (HOSPITAL_COMMUNITY): Payer: Medicare Other | Admitting: Psychiatry

## 2020-09-29 ENCOUNTER — Other Ambulatory Visit: Payer: Self-pay

## 2020-10-04 ENCOUNTER — Other Ambulatory Visit (INDEPENDENT_AMBULATORY_CARE_PROVIDER_SITE_OTHER): Payer: Self-pay | Admitting: Family Medicine

## 2020-10-04 DIAGNOSIS — E559 Vitamin D deficiency, unspecified: Secondary | ICD-10-CM

## 2020-10-04 DIAGNOSIS — E8881 Metabolic syndrome: Secondary | ICD-10-CM

## 2020-10-06 NOTE — Telephone Encounter (Signed)
Pt last seen by Dr. Wallace.  

## 2020-10-15 ENCOUNTER — Other Ambulatory Visit: Payer: Self-pay

## 2020-10-15 ENCOUNTER — Ambulatory Visit (INDEPENDENT_AMBULATORY_CARE_PROVIDER_SITE_OTHER): Payer: Medicare Other | Admitting: Family Medicine

## 2020-10-15 ENCOUNTER — Encounter (INDEPENDENT_AMBULATORY_CARE_PROVIDER_SITE_OTHER): Payer: Self-pay | Admitting: Family Medicine

## 2020-10-15 VITALS — BP 106/73 | HR 91 | Temp 97.8°F | Ht 65.0 in | Wt 234.0 lb

## 2020-10-15 DIAGNOSIS — E8881 Metabolic syndrome: Secondary | ICD-10-CM | POA: Diagnosis not present

## 2020-10-15 DIAGNOSIS — Z6841 Body Mass Index (BMI) 40.0 and over, adult: Secondary | ICD-10-CM

## 2020-10-15 DIAGNOSIS — K5903 Drug induced constipation: Secondary | ICD-10-CM | POA: Diagnosis not present

## 2020-10-15 DIAGNOSIS — E559 Vitamin D deficiency, unspecified: Secondary | ICD-10-CM | POA: Diagnosis not present

## 2020-10-15 MED ORDER — VITAMIN D (ERGOCALCIFEROL) 1.25 MG (50000 UNIT) PO CAPS: 50000.0000 [IU] | ORAL_CAPSULE | ORAL | 0 refills | Status: DC

## 2020-10-15 MED ORDER — LINACLOTIDE 145 MCG PO CAPS: 145.0000 ug | ORAL_CAPSULE | Freq: Every day | ORAL | 0 refills | Status: DC

## 2020-10-15 MED ORDER — TRULICITY 3 MG/0.5ML ~~LOC~~ SOAJ: 3.0000 mg | SUBCUTANEOUS | 0 refills | Status: DC

## 2020-10-16 ENCOUNTER — Other Ambulatory Visit (INDEPENDENT_AMBULATORY_CARE_PROVIDER_SITE_OTHER): Payer: Self-pay | Admitting: Family Medicine

## 2020-10-16 DIAGNOSIS — R600 Localized edema: Secondary | ICD-10-CM

## 2020-10-16 DIAGNOSIS — J309 Allergic rhinitis, unspecified: Secondary | ICD-10-CM

## 2020-10-16 NOTE — Progress Notes (Signed)
Chief Complaint:   OBESITY [name] is here to discuss her progress with her obesity treatment plan along with follow-up of her obesity related diagnoses. [name] is on practicing portion control and making smarter food choices, such as increasing vegetables and decreasing simple carbohydrates and states she is following her eating plan approximately 0% of the time. [name] states she is doing 0 minutes 0 times per week.  Today's visit was #: 23 Starting weight: 250 lbs Starting date: 03/27/2019 Today's weight: 234 lbs Today's date: 10/15/2020 Total lbs lost to date: 16 Total lbs lost since last in-office visit: 3  Interim History: [name] has 2-3 protein shakes per day and 2 meals per day (lunch and dinner). She is focusing on protein intake. She has 1 cup of half sweet tea per day. She had RNY In 2008 and she does not feel that she has portion restriction anymore. Her highest weight was 281 and low weight was 180 lbs.  Subjective:   1. Metabolic syndrome Barbara is tolerating Trulicity well. Last A1c was 5.1. Trulicity is providing good appetite suppression.  2. Drug-induced constipation [name] symptoms are managed well with Linzess.  3. Vitamin D deficiency [name] Vit D leel is very low at 17.9. She is on weekly prescription vit D.  Assessment/Plan:   1. Metabolic syndrome We will refill Trulicity for 1 month. [name] will continue to follow up as directed.  - Dulaglutide (TRULICITY) 3 MG/0.5ML SOPN; Inject 3 mg as directed once a week.  Dispense: 2 mL; Refill: 0  2. Drug-induced constipation  We will refill Linzess for 1 month. - linaclotide (LINZESS) 145 MCG CAPS capsule; Take 1 capsule (145 mcg total) by mouth daily before breakfast.  Dispense: 30 capsule; Refill: 0  3. Vitamin D deficiency  We will refill prescription Vitamin D for 90 days with no refills. [name] will follow-up for routine testing of Vitamin D, at least 2-3 times per year to avoid  over-replacement.  - Vitamin D, Ergocalciferol, (DRISDOL) 1.25 MG (50000 UNIT) CAPS capsule; Take 1 capsule (50,000 Units total) by mouth every 7 (seven) days.  Dispense: 12 capsule; Refill: 0  4. Obesity, current BMI 38.94 [name] is currently in the action stage of change. As such, her goal is to continue with weight loss efforts. She has agreed to practicing portion control and making smarter food choices, such as increasing vegetables and decreasing simple carbohydrates.   Exercise goals: No exercise has been prescribed at this time.  Behavioral modification strategies: increasing lean protein intake, decreasing simple carbohydrates and decreasing liquid calories.  [name] has agreed to follow-up with our clinic in 3 weeks with Colleen Hamburger, NP or Colleen Cliche, PA-C, and in 6 weeks with [name].   Objective:   Blood pressure 106/73, pulse 91, temperature 97.8 F (36.6 C), temperature source Oral, height 5\' 5"  (1.651 m), weight 234 lb (106.1 kg), SpO2 97 %. Body mass index is 38.94 kg/m.  General: Cooperative, alert, well developed, in no acute distress. HEENT: Conjunctivae and lids unremarkable. Cardiovascular: Regular rhythm.  Lungs: Normal work of breathing. Neurologic: No focal deficits.   Lab Results  Component Value Date   CREATININE 0.80 07/09/2020   BUN 19 07/09/2020   NA 143 07/09/2020   K 4.7 07/09/2020   CL 103 07/09/2020   CO2 25 07/09/2020   Lab Results  Component Value Date   ALT 13 07/09/2020   AST 16 07/09/2020   ALKPHOS 67 07/09/2020   BILITOT <0.2 07/09/2020   Lab  Results  Component Value Date   HGBA1C 5.1 07/09/2020   HGBA1C 5.6 12/20/2019   HGBA1C 5.5 06/27/2019   HGBA1C 5.9 (H) 03/27/2019   Lab Results  Component Value Date   INSULIN 8.8 07/09/2020   INSULIN 8.7 12/20/2019   INSULIN 7.9 03/27/2019   Lab Results  Component Value Date   TSH 3.120 07/09/2020   Lab Results  Component Value Date   CHOL 227 (H) 07/09/2020   HDL 142  07/09/2020   LDLCALC 67 07/09/2020   TRIG 110 07/09/2020   CHOLHDL 1.6 07/09/2020   Lab Results  Component Value Date   WBC 6.9 07/09/2020   HGB 12.2 07/09/2020   HCT 38.4 07/09/2020   MCV 90 07/09/2020   PLT 294 07/09/2020   Lab Results  Component Value Date   IRON 106 07/09/2020   TIBC 299 07/09/2020   FERRITIN 32 07/09/2020    Obesity Behavioral Intervention:   Approximately 15 minutes were spent on the discussion below.  ASK: We discussed the diagnosis of obesity with [name] today and [name] agreed to give [name] permission to discuss obesity behavioral modification therapy today.  ASSESS: [name] has the diagnosis of obesity and her BMI today is 38.94. [name] is in the action stage of change.   ADVISE: [name] was educated on the multiple health risks of obesity as well as the benefit of weight loss to improve her health. She was advised of the need for long term treatment and the importance of lifestyle modifications to improve her current health and to decrease her risk of future health problems.  AGREE: Multiple dietary modification options and treatment options were discussed and [name] agreed to follow the recommendations documented in the above note.  ARRANGE: [name] was educated on the importance of frequent visits to treat obesity as outlined per CMS and USPSTF guidelines and agreed to schedule her next follow up appointment today.  Attestation Statements:   Reviewed by clinician on day of visit: allergies, medications, problem list, medical history, surgical history, family history, social history, and previous encounter notes.   Trude Mcburney, am acting as Energy manager for [place], FNP-C.  I have reviewed the above documentation for accuracy and completeness, and I agree with the above. -  Jesse Sans, FNP

## 2020-10-16 NOTE — Telephone Encounter (Signed)
Last seen Dawn 

## 2020-10-21 ENCOUNTER — Encounter (INDEPENDENT_AMBULATORY_CARE_PROVIDER_SITE_OTHER): Payer: Self-pay | Admitting: Family Medicine

## 2020-10-21 DIAGNOSIS — E8881 Metabolic syndrome: Secondary | ICD-10-CM | POA: Insufficient documentation

## 2020-10-22 NOTE — Telephone Encounter (Signed)
Would you like to refill? 

## 2020-10-23 ENCOUNTER — Telehealth (HOSPITAL_COMMUNITY): Payer: Medicare Other | Admitting: Psychiatry

## 2020-10-25 ENCOUNTER — Encounter (HOSPITAL_COMMUNITY): Payer: Self-pay | Admitting: Family Medicine

## 2020-10-25 ENCOUNTER — Encounter: Payer: Self-pay | Admitting: *Deleted

## 2020-10-25 ENCOUNTER — Other Ambulatory Visit: Payer: Self-pay

## 2020-10-25 ENCOUNTER — Ambulatory Visit
Admission: EM | Admit: 2020-10-25 | Discharge: 2020-10-25 | Disposition: A | Discharge status: Nursing Home (Includes ICF) | Payer: Medicare Other

## 2020-10-25 ENCOUNTER — Inpatient Hospital Stay (HOSPITAL_COMMUNITY)
Admission: EM | Admit: 2020-10-25 | Discharge: 2020-10-28 | DRG: 177 | Disposition: A | Discharge status: Home / Self Care | Payer: Medicare Other | Attending: Internal Medicine | Admitting: Internal Medicine

## 2020-10-25 ENCOUNTER — Emergency Department (HOSPITAL_COMMUNITY): Payer: Medicare Other

## 2020-10-25 DIAGNOSIS — G4700 Insomnia, unspecified: Secondary | ICD-10-CM | POA: Diagnosis present

## 2020-10-25 DIAGNOSIS — U071 COVID-19: Secondary | ICD-10-CM

## 2020-10-25 DIAGNOSIS — G8929 Other chronic pain: Secondary | ICD-10-CM | POA: Diagnosis present

## 2020-10-25 DIAGNOSIS — M797 Fibromyalgia: Secondary | ICD-10-CM | POA: Diagnosis present

## 2020-10-25 DIAGNOSIS — F319 Bipolar disorder, unspecified: Secondary | ICD-10-CM | POA: Diagnosis present

## 2020-10-25 DIAGNOSIS — J069 Acute upper respiratory infection, unspecified: Secondary | ICD-10-CM | POA: Diagnosis present

## 2020-10-25 DIAGNOSIS — N179 Acute kidney failure, unspecified: Secondary | ICD-10-CM

## 2020-10-25 DIAGNOSIS — Z83438 Family history of other disorder of lipoprotein metabolism and other lipidemia: Secondary | ICD-10-CM

## 2020-10-25 DIAGNOSIS — J9601 Acute respiratory failure with hypoxia: Secondary | ICD-10-CM | POA: Diagnosis present

## 2020-10-25 DIAGNOSIS — Z8349 Family history of other endocrine, nutritional and metabolic diseases: Secondary | ICD-10-CM

## 2020-10-25 DIAGNOSIS — F411 Generalized anxiety disorder: Secondary | ICD-10-CM | POA: Diagnosis present

## 2020-10-25 DIAGNOSIS — Z90710 Acquired absence of both cervix and uterus: Secondary | ICD-10-CM

## 2020-10-25 DIAGNOSIS — I10 Essential (primary) hypertension: Secondary | ICD-10-CM | POA: Diagnosis present

## 2020-10-25 DIAGNOSIS — E119 Type 2 diabetes mellitus without complications: Secondary | ICD-10-CM | POA: Diagnosis present

## 2020-10-25 DIAGNOSIS — K219 Gastro-esophageal reflux disease without esophagitis: Secondary | ICD-10-CM | POA: Diagnosis present

## 2020-10-25 DIAGNOSIS — R0902 Hypoxemia: Secondary | ICD-10-CM

## 2020-10-25 DIAGNOSIS — Z9109 Other allergy status, other than to drugs and biological substances: Secondary | ICD-10-CM

## 2020-10-25 DIAGNOSIS — Z813 Family history of other psychoactive substance abuse and dependence: Secondary | ICD-10-CM | POA: Diagnosis not present

## 2020-10-25 DIAGNOSIS — F32A Depression, unspecified: Secondary | ICD-10-CM | POA: Diagnosis not present

## 2020-10-25 DIAGNOSIS — W19XXXA Unspecified fall, initial encounter: Secondary | ICD-10-CM | POA: Diagnosis present

## 2020-10-25 DIAGNOSIS — Z6837 Body mass index (BMI) 37.0-37.9, adult: Secondary | ICD-10-CM | POA: Diagnosis not present

## 2020-10-25 DIAGNOSIS — J45909 Unspecified asthma, uncomplicated: Secondary | ICD-10-CM | POA: Diagnosis present

## 2020-10-25 DIAGNOSIS — Z9884 Bariatric surgery status: Secondary | ICD-10-CM

## 2020-10-25 DIAGNOSIS — J1282 Pneumonia due to coronavirus disease 2019: Secondary | ICD-10-CM | POA: Diagnosis present

## 2020-10-25 DIAGNOSIS — Z811 Family history of alcohol abuse and dependence: Secondary | ICD-10-CM

## 2020-10-25 DIAGNOSIS — G4733 Obstructive sleep apnea (adult) (pediatric): Secondary | ICD-10-CM | POA: Diagnosis present

## 2020-10-25 DIAGNOSIS — J45901 Unspecified asthma with (acute) exacerbation: Secondary | ICD-10-CM | POA: Diagnosis present

## 2020-10-25 DIAGNOSIS — Z833 Family history of diabetes mellitus: Secondary | ICD-10-CM | POA: Diagnosis not present

## 2020-10-25 DIAGNOSIS — Z818 Family history of other mental and behavioral disorders: Secondary | ICD-10-CM | POA: Diagnosis not present

## 2020-10-25 DIAGNOSIS — Z8249 Family history of ischemic heart disease and other diseases of the circulatory system: Secondary | ICD-10-CM | POA: Diagnosis not present

## 2020-10-25 DIAGNOSIS — J4521 Mild intermittent asthma with (acute) exacerbation: Secondary | ICD-10-CM | POA: Diagnosis not present

## 2020-10-25 DIAGNOSIS — R7989 Other specified abnormal findings of blood chemistry: Secondary | ICD-10-CM

## 2020-10-25 HISTORY — DX: Acute upper respiratory infection, unspecified: J06.9

## 2020-10-25 HISTORY — DX: Syncope and collapse: R55

## 2020-10-25 HISTORY — DX: Acute kidney failure, unspecified: N17.9

## 2020-10-25 HISTORY — DX: COVID-19: U07.1

## 2020-10-25 LAB — CBC WITH DIFFERENTIAL/PLATELET
Abs Immature Granulocytes: 0.02 10*3/uL (ref 0.00–0.07)
Basophils Absolute: 0 10*3/uL (ref 0.0–0.1)
Basophils Relative: 0 %
Eosinophils Absolute: 0 10*3/uL (ref 0.0–0.5)
Eosinophils Relative: 1 %
HCT: 38.3 % (ref 36.0–46.0)
Hemoglobin: 12 g/dL (ref 12.0–15.0)
Immature Granulocytes: 0 %
Lymphocytes Relative: 14 %
Lymphs Abs: 1.1 10*3/uL (ref 0.7–4.0)
MCH: 28.4 pg (ref 26.0–34.0)
MCHC: 31.3 g/dL (ref 30.0–36.0)
MCV: 90.5 fL (ref 80.0–100.0)
Monocytes Absolute: 1.2 10*3/uL — ABNORMAL HIGH (ref 0.1–1.0)
Monocytes Relative: 15 %
Neutro Abs: 5.7 10*3/uL (ref 1.7–7.7)
Neutrophils Relative %: 70 %
Platelets: 215 10*3/uL (ref 150–400)
RBC: 4.23 MIL/uL (ref 3.87–5.11)
RDW: 13.7 % (ref 11.5–15.5)
WBC: 8.1 10*3/uL (ref 4.0–10.5)
nRBC: 0 % (ref 0.0–0.2)

## 2020-10-25 LAB — DIFFERENTIAL
Band Neutrophils: 1 %
Basophils Absolute: 0 10*3/uL (ref 0.0–0.1)
Basophils Relative: 0 %
Blasts: 1 %
Eosinophils Absolute: 0 10*3/uL (ref 0.0–0.5)
Eosinophils Relative: 0 %
Lymphocytes Relative: 22 %
Lymphs Abs: 1.2 10*3/uL (ref 0.7–4.0)
Metamyelocytes Relative: NONE SEEN %
Monocytes Absolute: 1 10*3/uL (ref 0.1–1.0)
Monocytes Relative: 7 %
Myelocytes: NONE SEEN %
Neutro Abs: 4.5 10*3/uL (ref 1.7–7.7)
Neutrophils Relative %: 69 %
Promyelocytes Relative: NONE SEEN %
RBC Morphology: NORMAL
WBC Morphology: NORMAL
nRBC: NONE SEEN /100 WBC

## 2020-10-25 LAB — BASIC METABOLIC PANEL
Anion gap: 10 (ref 5–15)
BUN: 28 mg/dL — ABNORMAL HIGH (ref 6–20)
CO2: 24 mmol/L (ref 22–32)
Calcium: 8.4 mg/dL — ABNORMAL LOW (ref 8.9–10.3)
Chloride: 102 mmol/L (ref 98–111)
Creatinine, Ser: 1.45 mg/dL — ABNORMAL HIGH (ref 0.44–1.00)
GFR, Estimated: 42 mL/min — ABNORMAL LOW (ref >60)
Glucose, Bld: 87 mg/dL (ref 70–99)
Potassium: 3.9 mmol/L (ref 3.5–5.1)
Sodium: 136 mmol/L (ref 135–145)

## 2020-10-25 LAB — D-DIMER, QUANTITATIVE: D-Dimer, Quant: 0.87 ug/mL-FEU — ABNORMAL HIGH (ref 0.00–0.50)

## 2020-10-25 LAB — RESP PANEL BY RT-PCR (FLU A&B, COVID) ARPGX2
Influenza A by PCR: NEGATIVE
Influenza B by PCR: NEGATIVE
SARS Coronavirus 2 by RT PCR: POSITIVE — AB

## 2020-10-25 LAB — PROCALCITONIN: Procalcitonin: <0.1 ng/mL

## 2020-10-25 LAB — FERRITIN: Ferritin: 66 ng/mL (ref 11–307)

## 2020-10-25 LAB — C-REACTIVE PROTEIN: CRP: 13.2 mg/dL — ABNORMAL HIGH (ref <1.0)

## 2020-10-25 LAB — LACTIC ACID, PLASMA: Lactic Acid, Venous: 0.7 mmol/L (ref 0.5–1.9)

## 2020-10-25 LAB — TRIGLYCERIDES: Triglycerides: 92 mg/dL (ref <150)

## 2020-10-25 LAB — FIBRINOGEN: Fibrinogen: 552 mg/dL — ABNORMAL HIGH (ref 210–475)

## 2020-10-25 LAB — LACTATE DEHYDROGENASE: LDH: 143 U/L (ref 98–192)

## 2020-10-25 MED ORDER — ONDANSETRON HCL 4 MG PO TABS: 4.0000 mg | ORAL_TABLET | Freq: Four times a day (QID) | ORAL | Status: DC | PRN

## 2020-10-25 MED ORDER — SODIUM CHLORIDE 0.9 % IV SOLN: 200.0000 mg | Freq: Once | INTRAVENOUS | Status: DC

## 2020-10-25 MED ORDER — ONDANSETRON HCL 4 MG/2ML IJ SOLN: 4.0000 mg | Freq: Four times a day (QID) | INTRAMUSCULAR | Status: DC | PRN

## 2020-10-25 MED ORDER — MONTELUKAST SODIUM 10 MG PO TABS
10.0000 mg | ORAL_TABLET | Freq: Every day | ORAL | Status: DC
  Administered 2020-10-25 – 2020-10-27 (×3): 10 mg via ORAL
  Filled 2020-10-25 (×4): qty 1

## 2020-10-25 MED ORDER — ALBUTEROL SULFATE HFA 108 (90 BASE) MCG/ACT IN AERS
2.0000 | INHALATION_SPRAY | Freq: Four times a day (QID) | RESPIRATORY_TRACT | Status: DC | PRN
  Administered 2020-10-27: 2 via RESPIRATORY_TRACT

## 2020-10-25 MED ORDER — ENOXAPARIN SODIUM 40 MG/0.4ML IJ SOSY
40.0000 mg | PREFILLED_SYRINGE | INTRAMUSCULAR | Status: DC
  Administered 2020-10-26: 40 mg via SUBCUTANEOUS
  Filled 2020-10-25: qty 0.4

## 2020-10-25 MED ORDER — METHYLPREDNISOLONE SODIUM SUCC 125 MG IJ SOLR
60.0000 mg | Freq: Three times a day (TID) | INTRAMUSCULAR | Status: DC
  Administered 2020-10-25 – 2020-10-28 (×8): 60 mg via INTRAVENOUS
  Filled 2020-10-25: qty 2
  Filled 2020-10-25: qty 0.96
  Filled 2020-10-25 (×6): qty 2

## 2020-10-25 MED ORDER — PRAZOSIN HCL 1 MG PO CAPS
2.0000 mg | ORAL_CAPSULE | Freq: Every day | ORAL | Status: DC
  Administered 2020-10-25 – 2020-10-27 (×3): 2 mg via ORAL
  Filled 2020-10-25 (×4): qty 2

## 2020-10-25 MED ORDER — ONDANSETRON HCL 4 MG/2ML IJ SOLN
4.0000 mg | Freq: Once | INTRAMUSCULAR | Status: AC
  Administered 2020-10-25: 4 mg via INTRAVENOUS
  Filled 2020-10-25: qty 2

## 2020-10-25 MED ORDER — DIVALPROEX SODIUM ER 500 MG PO TB24
500.0000 mg | ORAL_TABLET | Freq: Every day | ORAL | Status: DC
  Administered 2020-10-25 – 2020-10-27 (×3): 500 mg via ORAL
  Filled 2020-10-25 (×3): qty 1

## 2020-10-25 MED ORDER — SODIUM CHLORIDE 0.9 % IV SOLN
100.0000 mg | Freq: Every day | INTRAVENOUS | Status: DC
  Administered 2020-10-26 – 2020-10-28 (×3): 100 mg via INTRAVENOUS
  Filled 2020-10-25 (×3): qty 20

## 2020-10-25 MED ORDER — PREDNISONE 50 MG PO TABS: 50.0000 mg | ORAL_TABLET | Freq: Every day | ORAL | Status: DC

## 2020-10-25 MED ORDER — DEXAMETHASONE SODIUM PHOSPHATE 10 MG/ML IJ SOLN
6.0000 mg | Freq: Once | INTRAMUSCULAR | Status: AC
  Administered 2020-10-25: 6 mg via INTRAVENOUS
  Filled 2020-10-25: qty 1

## 2020-10-25 MED ORDER — CLONAZEPAM 0.5 MG PO TABS
0.5000 mg | ORAL_TABLET | Freq: Three times a day (TID) | ORAL | Status: DC | PRN
  Administered 2020-10-25 – 2020-10-28 (×3): 0.5 mg via ORAL
  Filled 2020-10-25 (×3): qty 1

## 2020-10-25 MED ORDER — BUPROPION HCL ER (XL) 300 MG PO TB24
300.0000 mg | ORAL_TABLET | Freq: Every day | ORAL | Status: DC
  Administered 2020-10-26 – 2020-10-28 (×3): 300 mg via ORAL
  Filled 2020-10-25: qty 1
  Filled 2020-10-25: qty 2
  Filled 2020-10-25: qty 1

## 2020-10-25 MED ORDER — NALOXEGOL OXALATE 12.5 MG PO TABS
12.5000 mg | ORAL_TABLET | Freq: Every day | ORAL | Status: DC
  Administered 2020-10-26 – 2020-10-28 (×3): 12.5 mg via ORAL
  Filled 2020-10-25 (×3): qty 1

## 2020-10-25 MED ORDER — MOMETASONE FURO-FORMOTEROL FUM 200-5 MCG/ACT IN AERO
2.0000 | INHALATION_SPRAY | Freq: Two times a day (BID) | RESPIRATORY_TRACT | Status: DC
  Administered 2020-10-26 – 2020-10-28 (×4): 2 via RESPIRATORY_TRACT
  Filled 2020-10-25 (×2): qty 8.8

## 2020-10-25 MED ORDER — LINACLOTIDE 145 MCG PO CAPS
145.0000 ug | ORAL_CAPSULE | Freq: Every day | ORAL | Status: DC
  Administered 2020-10-26 – 2020-10-28 (×3): 145 ug via ORAL
  Filled 2020-10-25 (×4): qty 1

## 2020-10-25 MED ORDER — ALBUTEROL SULFATE HFA 108 (90 BASE) MCG/ACT IN AERS
4.0000 | INHALATION_SPRAY | Freq: Once | RESPIRATORY_TRACT | Status: AC
  Administered 2020-10-25: 4 via RESPIRATORY_TRACT
  Filled 2020-10-25: qty 6.7

## 2020-10-25 MED ORDER — SODIUM CHLORIDE 0.9 % IV SOLN: 100.0000 mg | Freq: Every day | INTRAVENOUS | Status: DC

## 2020-10-25 MED ORDER — SODIUM CHLORIDE 0.9 % IV SOLN: INTRAVENOUS | Status: DC

## 2020-10-25 MED ORDER — DOXEPIN HCL 50 MG PO CAPS
100.0000 mg | ORAL_CAPSULE | Freq: Every day | ORAL | Status: DC
  Administered 2020-10-25 – 2020-10-27 (×3): 100 mg via ORAL
  Filled 2020-10-25 (×4): qty 2

## 2020-10-25 MED ORDER — SODIUM CHLORIDE 0.9 % IV BOLUS
500.0000 mL | Freq: Once | INTRAVENOUS | Status: AC
  Administered 2020-10-25: 500 mL via INTRAVENOUS

## 2020-10-25 MED ORDER — HYDROCOD POLST-CPM POLST ER 10-8 MG/5ML PO SUER
5.0000 mL | Freq: Two times a day (BID) | ORAL | Status: DC | PRN
  Administered 2020-10-25 – 2020-10-27 (×4): 5 mL via ORAL
  Filled 2020-10-25 (×4): qty 5

## 2020-10-25 MED ORDER — GUAIFENESIN-DM 100-10 MG/5ML PO SYRP
10.0000 mL | ORAL_SOLUTION | ORAL | Status: DC | PRN
  Administered 2020-10-26: 10 mL via ORAL
  Filled 2020-10-25: qty 10

## 2020-10-25 MED ORDER — SODIUM CHLORIDE 0.9 % IV SOLN
100.0000 mg | INTRAVENOUS | Status: AC
  Administered 2020-10-25 (×2): 100 mg via INTRAVENOUS
  Filled 2020-10-25 (×2): qty 20

## 2020-10-25 MED ORDER — TAPENTADOL HCL 50 MG PO TABS
75.0000 mg | ORAL_TABLET | Freq: Three times a day (TID) | ORAL | Status: DC | PRN
  Administered 2020-10-28: 75 mg via ORAL
  Filled 2020-10-25: qty 2

## 2020-10-25 MED ORDER — KETOROLAC TROMETHAMINE 15 MG/ML IJ SOLN
15.0000 mg | Freq: Once | INTRAMUSCULAR | Status: AC
  Administered 2020-10-25: 15 mg via INTRAVENOUS
  Filled 2020-10-25: qty 1

## 2020-10-25 NOTE — H&P (Signed)
History and Physical    [name]:[id number] DOB: 02-14-1966 DOA: 10/25/2020  PCP: Iona Hansen, NP   Patient coming from: Home   Chief Complaint: Aches, fatigue, N/V/D, cough   HPI: [name] is a 55 y.o. female with medical history significant for asthma, chronic pain, bipolar disorder, anxiety, insomnia, OSA, BMI 37, and gastric bypass in 2008, now presenting to the emergency department with cough, fatigue, headaches, and malaise.  Patient reports that she been in her usual state of health until 2 or 3 days ago when she developed general aches, fatigue, cough, and sore throat.  She has also had nausea, and a couple loose stools without abdominal pain.  She had been using her pain medications and albuterol at home continued to worsen and sought evaluation at an urgent care where she was slightly tachycardic and tachypneic, hypoxic on room air, and directed to the ED.  Patient has had 3 doses of the Pfizer vaccine.  She denies leg swelling or hemoptysis.  ED Course: Upon arrival to the ED, patient is found to be afebrile, saturating 88% on room air, and mildly tachypneic and tachycardic.  EKG features a sinus rhythm.  Chest x-ray concerning for multifocal pneumonia.  CBC is unremarkable.  Chemistry panel notable for creatinine 1.45.  Procalcitonin undetectable.  CRP 13.2.  D-dimer 0.87.  COVID-19 PCR is positive.  Patient was given IV fluids, Toradol, Zofran, Decadron, and albuterol in the ED.   Review of Systems:  All other systems reviewed and apart from HPI, are negative.  Past Medical History:  Diagnosis Date  . Allergic rhinitis   . Allergic to cats    pet dander  . Allergy   . Anemia   . Anxiety   . Arthritis 09/2018   both feet  . Asthma   . Back pain   . Bipolar disorder (HCC)   . Cervicalgia   . Chronic fatigue syndrome   . Constipation   . Depression   . Diabetes mellitus without complication (HCC)    "pre"  . Dyspnea   . Environmental allergies   .  Fibromyalgia   . GERD (gastroesophageal reflux disease)   . Grave's disease   . Heart murmur   . High cholesterol   . History of blood clots   . Hypertension   . Joint pain   . Lactose intolerance   . Lower extremity edema   . Multiple food allergies   . OSA (obstructive sleep apnea)   . Osteoporosis   . Palpitations   . Panic disorder   . Rheumatoid arthritis (HCC)   . Risk for falls   . Sciatica   . 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 Sleep apnea    no cpap worn  . Syncope   . Syncope and collapse   . Thyroid disease   . Vasovagal syncope   . Vertigo   . Vitamin D deficiency     Past Surgical History:  Procedure Laterality Date  . ABDOMINAL HYSTERECTOMY  2006  . COLONOSCOPY    . GASTRIC BYPASS  2008    Social History:   reports that she has never smoked. She has never used smokeless tobacco. She reports previous alcohol use. She reports that she does not use drugs.  Allergies  Allergen Reactions  . Bee Venom Anaphylaxis    Other reaction(s): PRURITUS, RASH  . Coconut Fatty Acids Anaphylaxis    Patient allergic to coconut in general  .  Mushroom Ext Cmplx(Shiitake-Reishi-Mait) Anaphylaxis  . Nutritional Supplements Anaphylaxis and Itching    walnuts  . Other Anaphylaxis, Hives and Itching    Ragweed  . Shellfish Allergy Anaphylaxis  . Strawberry Extract Anaphylaxis  . Hydrocodone-Acetaminophen Itching  . Latex     Family History  Problem Relation Age of Onset  . Hypertension Mother   . Cirrhosis Mother   . Alcohol abuse Mother   . Depression Mother   . Physical abuse Mother   . Hyperlipidemia Mother   . Thyroid disease Mother   . Anxiety disorder Mother   . Hypertension Sister   . Alcohol abuse Sister   . Drug abuse Sister   . Depression Sister   . Anxiety disorder Sister   . OCD Sister   . Hypertension Father   . Alcohol abuse Father   . Hyperlipidemia Father   . Depression Father   . Drug abuse  Father   . ADD / ADHD Other   . Depression Sister   . Diabetes Other   . Hypertension Other   . Diabetes Maternal Uncle   . Seizures Cousin   . Dementia Neg Hx   . Colon cancer Neg Hx   . Esophageal cancer Neg Hx   . Rectal cancer Neg Hx   . Stomach cancer Neg Hx      Prior to Admission medications   Medication Sig Start Date End Date Taking? Authorizing Provider  albuterol (VENTOLIN HFA) 108 (90 Base) MCG/ACT inhaler Inhale 2 puffs into the lungs every 6 (six) hours as needed for wheezing. 12/20/19   Whitmire, Thermon Leyland, FNP  budesonide-formoterol (SYMBICORT) 80-4.5 MCG/ACT inhaler Inhale 2 puffs into the lungs 2 (two) times daily as needed (wheezing/sob).     [provider]  buPROPion (WELLBUTRIN XL) 300 MG 24 hr tablet Take 1 tablet (300 mg total) by mouth daily. 09/18/20   Oletta Darter, MD  Cetirizine HCl 10 MG CAPS daily.  08/08/19   [provider]  clonazePAM (KLONOPIN) 0.5 MG tablet Take 1 tablet (0.5 mg total) by mouth 3 (three) times daily as needed. 09/18/20 12/17/20  Oletta Darter, MD  diclofenac sodium (VOLTAREN) 1 % GEL Apply 2 g topically 4 (four) times daily. 12/12/18   Vivi Barrack, DPM  divalproex (DEPAKOTE ER) 500 MG 24 hr tablet Take 1 tablet (500 mg total) by mouth in the morning and at bedtime. 09/18/20   Oletta Darter, MD  doxepin (SINEQUAN) 100 MG capsule Take 1 capsule (100 mg total) by mouth at bedtime. 09/18/20   Oletta Darter, MD  Dulaglutide (TRULICITY) 3 MG/0.5ML SOPN Inject 3 mg as directed once a week. 10/15/20   Whitmire, Thermon Leyland, FNP  EPINEPHrine 0.3 mg/0.3 mL IJ SOAJ injection Inject 0.3 mg into the muscle as needed for anaphylaxis. 04/28/20   Anson Fret, MD  estradiol (ESTRACE) 0.5 MG tablet Take 0.5 mg by mouth daily.  07/20/19   [provider]  fluticasone (FLONASE) 50 MCG/ACT nasal spray PLACE 1 SPRAY INTO BOTH NOSTRILS AS NEEDED (ALLERGIES/RUNNY NOSE). 10/22/20   Whitmire, Dawn W, FNP  Galcanezumab-gnlm (EMGALITY)  120 MG/ML SOAJ Inject 120 mg into the skin every 30 (thirty) days. 04/28/20   Anson Fret, MD  hydrochlorothiazide (MICROZIDE) 12.5 MG capsule TAKE 1 CAPSULE (12.5 MG TOTAL) BY MOUTH DAILY AS NEEDED (EDEMA). 10/22/20   Whitmire, Thermon Leyland, FNP  linaclotide (LINZESS) 145 MCG CAPS capsule Take 1 capsule (145 mcg total) by mouth daily before breakfast. 10/15/20   Whitmire, [employer]  W, FNP  meloxicam (MOBIC) 15 MG tablet Take 15 mg by mouth daily. 06/17/20   [provider]  montelukast (SINGULAIR) 10 MG tablet Take 1 tablet (10 mg total) by mouth at bedtime. 12/20/19   Whitmire, Dawn W, FNP  MOVANTIK 25 MG TABS tablet daily.  07/27/19   [provider]  Multiple Vitamin (MULTIVITAMIN) tablet Take 1 tablet by mouth daily.    [provider]  Oxycodone HCl 10 MG TABS Take 10 mg by mouth 4 (four) times daily. 10/06/20   [provider]  phentermine 15 MG capsule Take 1 capsule (15 mg total) by mouth every morning. 06/19/20   Helane Rima, DO  prazosin (MINIPRESS) 2 MG capsule Take 1 capsule (2 mg total) by mouth at bedtime. 09/18/20   Oletta Darter, MD  tapentadol HCl (NUCYNTA) 75 MG tablet Take 75 mg by mouth every 8 (eight) hours as needed.    [provider]  Thiamine HCl (VITAMIN B-1 PO) Take 1 tablet by mouth daily.     [provider]  Vitamin D, Ergocalciferol, (DRISDOL) 1.25 MG (50000 UNIT) CAPS capsule Take 1 capsule (50,000 Units total) by mouth every 7 (seven) days. 10/15/20   Whitmire, Thermon Leyland, FNP    Physical Exam: Vitals:   10/25/20 2100 10/25/20 2115 10/25/20 2130 10/25/20 2145  BP: (!) 119/51  111/72   Pulse: 93 93 91 92  Resp: 15 16 16  (!) 22  Temp:      TempSrc:      SpO2: 98% 94% 94% 94%  Weight:      Height:        Constitutional: NAD, calm  Eyes: PERTLA, lids and conjunctivae normal ENMT: Mucous membranes are moist. Posterior pharynx clear of any exudate or lesions.   Neck: supple, no masses  Respiratory: Mild tachypnea at  rest, no wheezing. No accessory muscle use.  Cardiovascular: S1 & S2 heard, regular rate and rhythm. No extremity edema. Abdomen: No distension, no tenderness, soft. Bowel sounds active.  Musculoskeletal: no clubbing / cyanosis. No joint deformity upper and lower extremities.   Skin: no significant rashes, lesions, ulcers. Warm, dry, well-perfused. Neurologic: CN 2-12 grossly intact. Sensation intact. Moving all extremities.  Psychiatric: Alert and oriented to person, place, and situation. Calm and cooperative.    Labs and Imaging on Admission: I have personally reviewed following labs and imaging studies  CBC: Recent Labs  Lab 10/25/20 1554 10/25/20 2030  WBC 8.1  --   NEUTROABS 5.7 4.5  HGB 12.0  --   HCT 38.3  --   MCV 90.5  --   PLT 215  --    Basic Metabolic Panel: Recent Labs  Lab 10/25/20 1554  NA 136  K 3.9  CL 102  CO2 24  GLUCOSE 87  BUN 28*  CREATININE 1.45*  CALCIUM 8.4*   GFR: Estimated Creatinine Clearance: 52.2 mL/min (A) (by C-G formula based on SCr of 1.45 mg/dL (H)). Liver Function Tests: No results for input(s): AST, ALT, ALKPHOS, BILITOT, PROT, ALBUMIN in the last 168 hours. No results for input(s): LIPASE, AMYLASE in the last 168 hours. No results for input(s): AMMONIA in the last 168 hours. Coagulation Profile: No results for input(s): INR, PROTIME in the last 168 hours. Cardiac Enzymes: No results for input(s): CKTOTAL, CKMB, CKMBINDEX, TROPONINI in the last 168 hours. BNP (last 3 results) No results for input(s): PROBNP in the last 8760 hours. HbA1C: No results for input(s): HGBA1C in the last 72 hours. CBG: No  results for input(s): GLUCAP in the last 168 hours. Lipid Profile: Recent Labs    10/25/20 1940  TRIG 92   Thyroid Function Tests: No results for input(s): TSH, T4TOTAL, FREET4, T3FREE, THYROIDAB in the last 72 hours. Anemia Panel: Recent Labs    10/25/20 1940  FERRITIN 66   Urine analysis:    Component Value Date/Time    COLORURINE AMBER (A) 12/12/2019 1930   APPEARANCEUR Clear 08/13/2020 1447   LABSPEC 1.031 (H) 12/12/2019 1930   PHURINE 6.0 12/12/2019 1930   GLUCOSEU Negative 08/13/2020 1447   HGBUR NEGATIVE 12/12/2019 1930   BILIRUBINUR Negative 08/13/2020 1447   KETONESUR NEGATIVE 12/12/2019 1930   PROTEINUR Negative 08/13/2020 1447   PROTEINUR NEGATIVE 12/12/2019 1930   UROBILINOGEN 0.2 02/13/2015 1502   NITRITE Negative 08/13/2020 1447   NITRITE NEGATIVE 12/12/2019 1930   LEUKOCYTESUR Negative 08/13/2020 1447   LEUKOCYTESUR NEGATIVE 12/12/2019 1930   Sepsis Labs: @LABRCNTIP (procalcitonin:4,lacticidven:4) ) Recent Results (from the past 240 hour(s))  Resp Panel by RT-PCR (Flu A&B, Covid) Nasopharyngeal Swab     Status: Abnormal   Collection Time: 10/25/20  3:29 PM   Specimen: Nasopharyngeal Swab; Nasopharyngeal(NP) swabs in vial transport medium  Result Value Ref Range Status   SARS Coronavirus 2 by RT PCR POSITIVE (A) NEGATIVE Final    Comment: RESULT CALLED TO, READ BACK BY AND VERIFIED WITH: SARA DOSTER AT 2042 ON 09/24/20 BY MAJ (NOTE) SARS-CoV-2 target nucleic acids are DETECTED.  The SARS-CoV-2 RNA is generally detectable in upper respiratory specimens during the acute phase of infection. Positive results are indicative of the presence of the identified virus, but do not rule out bacterial infection or co-infection with other pathogens not detected by the test. Clinical correlation with patient history and other diagnostic information is necessary to determine patient infection status. The expected result is Negative.  Fact Sheet for Patients: 11/24/20  Fact Sheet for Healthcare Providers: BloggerCourse.com  This test is not yet approved or cleared by the [employer] FDA and  has been authorized for detection and/or diagnosis of SARS-CoV-2 by FDA under an Emergency Use Authorization (EUA).  This EUA will remain in  effect (meaning this test c an be used) for the duration of  the COVID-19 declaration under Section 564(b)(1) of the Act, 21 U.S.C. section 360bbb-3(b)(1), unless the authorization is terminated or revoked sooner.     Influenza A by PCR NEGATIVE NEGATIVE Final   Influenza B by PCR NEGATIVE NEGATIVE Final    Comment: (NOTE) The Xpert Xpress SARS-CoV-2/FLU/RSV plus assay is intended as an aid in the diagnosis of influenza from Nasopharyngeal swab specimens and should not be used as a sole basis for treatment. Nasal washings and aspirates are unacceptable for Xpert Xpress SARS-CoV-2/FLU/RSV testing.  Fact Sheet for Patients: [place]  Fact Sheet for Healthcare Providers: BloggerCourse.com  This test is not yet approved or cleared by the [employer]ious[employer] FDA and has been authorized for detection and/or diagnosis of SARS-CoV-2 by FDA under an Emergency Use Authorization (EUA). This EUA will remain in effect (meaning this test can be used) for the duration of the COVID-19 declaration under Section 564(b)(1) of the Act, 21 U.S.C. section 360bbb-3(b)(1), unless the authorization is terminated or revoked.  Performed at [facility], [address] [place], [place]      Radiological Exams on Admission: DG Chest Portable 1 View  Result Date: 10/25/2020 CLINICAL DATA:  Shortness of breath. Cough and intermittent chest pain, body aches, vomiting. EXAM: PORTABLE CHEST 1  VIEW COMPARISON:  Chest x-ray dated 03/19/2019. FINDINGS: New streaky opacities within the RIGHT upper lobe and at the bilateral lung bases. Heart size and mediastinal contours are stable. Osseous structures about the chest are unremarkable. IMPRESSION: New streaky opacities within the RIGHT upper lobe and at the bilateral lung bases, suspicious for multifocal pneumonia. Electronically Signed   By: Bary Richard M.D.   On: 10/25/2020 15:52     EKG: Independently reviewed. Sinus rhythm.   Assessment/Plan   1. COVID-19 infection with acute hypoxic respiratory failure  - Presents with 2-3 days of aches, fatigue, cough, and N/V/D and is found to be mildly tachypneic and saturating 88% on room air at rest  - CXR concerning for multifocal pneumonia, likely viral with undetectable procalcitonin and normal WBC  - D-dimer mildly elevated without leg swelling or hemoptysis  - Continue supplemental O2 as needed, continue systemic steroids, start remdesivir, trend inflammatory markers    2. AKI  - SCr is 1.45 on admission, up from 0.80 in February 2022 - Likely acute prerenal azotemia in setting of loss of appetite and N/V/D  - Hold HCTZ, continue gentle IVF hydration overnight, repeat chemistry panel in am   3. Asthma  - No wheezing on admission  - Continue Singulair and as-needed albuterol    4. Depression, anxiety, insomnia  - Appears stable  - Continue Depakote, Doxepin, prazosin, Wellbutrin, and as-needed Klonopin    5. Chronic pain  - Continue home-regimen, database reviewed    DVT prophylaxis: Lovenox  Code Status: Full  Level of Care: Level of care: Telemetry Family Communication: none present  Disposition Plan:  Patient is from: Home  Anticipated d/c is to: Home  Anticipated d/c date is: 10/28/20 Patient currently: Pending improvement in respiratory status  Consults called: none  Admission status: Inpatient     Briscoe Deutscher, MD [facility]  10/25/2020, 10:29 PM

## 2020-10-25 NOTE — ED Triage Notes (Signed)
Patient reports she went to urgent care because her o2 level was low. Says she has all symptoms of covid except "red eye". Symptoms started two days ago.

## 2020-10-25 NOTE — ED Notes (Signed)
Pt placed on 2L O2 via [place].

## 2020-10-25 NOTE — ED Provider Notes (Signed)
[facility]-EMERGENCY DEPT Provider Note   CSN: [id number] Arrival date & time: 10/25/20  1501     History Chief Complaint  Patient presents with  . covid symptoms    [name] is a 55 y.o. female presenting for evaluation of cough, shortness of breath, body aches, loss of taste and smell, nausea, vomiting, diarrhea. Patient states her symptoms began 3 days ago.  She is at urgent care today, there is concern that her oxygen levels were low, so she sent to the ER.  She reports cough and shortness of breath over the past couple days, shortness of breath is worse with exertion but present at rest.  She has been taking her home pain medication which minimally improves her myalgias.  She is vaccinated x3 for COVID.  Denies sick contacts.  She denies significant chest pain.  She has associated nausea, vomiting, diarrhea.  No focal abdominal pain. She has been using her inhaler with minimal improvement in sxs.  Additionally, patient states she feels presyncopal, the past couple days has had several instances where she is almost passed out.  She had an episode in the waiting room when she fell onto her right knee.  Additional history obtained in chart review.  History of asthma, Graves', anxiety, vertigo, RA, bipolar, GERD, fibromyalgia, depression, hypertension, OSA, diabetes.  HPI     Past Medical History:  Diagnosis Date  . Allergic rhinitis   . Allergic to cats    pet dander  . Allergy   . Anemia   . Anxiety   . Arthritis 09/2018   both feet  . Asthma   . Back pain   . Bipolar disorder (HCC)   . Cervicalgia   . Chronic fatigue syndrome   . Constipation   . Depression   . Diabetes mellitus without complication (HCC)    "pre"  . Dyspnea   . Environmental allergies   . Fibromyalgia   . GERD (gastroesophageal reflux disease)   . Grave's disease   . Heart murmur   . High cholesterol   . History of blood clots   . Hypertension   . Joint pain   .  Lactose intolerance   . Lower extremity edema   . Multiple food allergies   . OSA (obstructive sleep apnea)   . Osteoporosis   . Palpitations   . Panic disorder   . Rheumatoid arthritis (HCC)   . Risk for falls   . Sciatica   . 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 Sleep apnea    no cpap worn  . Syncope   . Syncope and collapse   . Thyroid disease   . Vasovagal syncope   . Vertigo   . Vitamin D deficiency     Patient Active Problem List   Diagnosis Date Noted  . Acute respiratory disease due to COVID-19 virus 10/25/2020  . AKI (acute kidney injury) (HCC) 10/25/2020  . Metabolic syndrome 10/21/2020  . Constipation 07/09/2020  . Subclinical hypothyroidism 05/11/2019  . Prediabetes 05/11/2019  . Vitamin D deficiency 05/11/2019  . OSA (obstructive sleep apnea) 05/11/2019  . Intractable chronic migraine without aura and with status migrainosus 01/14/2016  . Risk for falls 09/02/2015  . Insomnia 05/27/2015  . 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 Class 3 severe obesity with serious comorbidity and body mass index (BMI) of 40.0 to 44.9 in adult (HCC) 11/14/2014  . Severe recurrent major depressive  disorder with psychotic features (HCC) 10/10/2014  . GAD (generalized anxiety disorder) 10/10/2014  . Panic disorder with agoraphobia 10/10/2014  . Social anxiety disorder 10/10/2014  . Jaw pain-acute TMJ 09/24/14 09/24/2014  . Asthma 08/01/2014  . Low back pain 07/24/2014  . Encounter for monitoring opioid maintenance therapy 07/02/2014  . Pharyngoesophageal dysphagia 06/24/2014  . S/P gastric bypass 06/24/2014  . Protein-calorie malnutrition, severe (HCC) 11/03/2012  . Drug overdose, multiple drugs 11/01/2012  . Migraine 05/22/2012  . Depression 11/22/2011  . Chronic pain 11/22/2011  . Graves' disease   . GERD (gastroesophageal reflux disease)   . Need for prophylactic postmenopausal hormone  replacement therapy 11/03/2011  . Allergic rhinitis 09/28/2011  . Anemia 09/28/2011  . Essential hypertension 09/28/2011    Past Surgical History:  Procedure Laterality Date  . ABDOMINAL HYSTERECTOMY  2006  . COLONOSCOPY    . GASTRIC BYPASS  2008     OB History    Gravida  2   Para  2   Term      Preterm      AB      Living  2     SAB      IAB      Ectopic      Multiple      Live Births              Family History  Problem Relation Age of Onset  . Hypertension Mother   . Cirrhosis Mother   . Alcohol abuse Mother   . Depression Mother   . Physical abuse Mother   . Hyperlipidemia Mother   . Thyroid disease Mother   . Anxiety disorder Mother   . Hypertension Sister   . Alcohol abuse Sister   . Drug abuse Sister   . Depression Sister   . Anxiety disorder Sister   . OCD Sister   . Hypertension Father   . Alcohol abuse Father   . Hyperlipidemia Father   . Depression Father   . Drug abuse Father   . ADD / ADHD Other   . Depression Sister   . Diabetes Other   . Hypertension Other   . Diabetes Maternal Uncle   . Seizures Cousin   . Dementia Neg Hx   . Colon cancer Neg Hx   . Esophageal cancer Neg Hx   . Rectal cancer Neg Hx   . Stomach cancer Neg Hx     Social History   Tobacco Use  . Smoking status: Never Smoker  . Smokeless tobacco: Never Used  Vaping Use  . Vaping Use: Never used  Substance Use Topics  . Alcohol use: Not Currently  . Drug use: No    Home Medications Prior to Admission medications   Medication Sig Start Date End Date Taking? Authorizing Provider  albuterol (VENTOLIN HFA) 108 (90 Base) MCG/ACT inhaler Inhale 2 puffs into the lungs every 6 (six) hours as needed for wheezing. 12/20/19   Whitmire, Thermon Leyland, FNP  budesonide-formoterol (SYMBICORT) 80-4.5 MCG/ACT inhaler Inhale 2 puffs into the lungs 2 (two) times daily as needed (wheezing/sob).     [provider]  buPROPion (WELLBUTRIN XL) 300 MG 24 hr tablet Take  1 tablet (300 mg total) by mouth daily. 09/18/20   Oletta Darter, MD  Cetirizine HCl 10 MG CAPS daily.  08/08/19   [provider]  clonazePAM (KLONOPIN) 0.5 MG tablet Take 1 tablet (0.5 mg total) by mouth 3 (three) times daily as needed. 09/18/20 12/17/20  Michae Kava,  Theadora Rama, MD  diclofenac sodium (VOLTAREN) 1 % GEL Apply 2 g topically 4 (four) times daily. 12/12/18   Vivi Barrack, DPM  divalproex (DEPAKOTE ER) 500 MG 24 hr tablet Take 1 tablet (500 mg total) by mouth in the morning and at bedtime. 09/18/20   Oletta Darter, MD  doxepin (SINEQUAN) 100 MG capsule Take 1 capsule (100 mg total) by mouth at bedtime. 09/18/20   Oletta Darter, MD  Dulaglutide (TRULICITY) 3 MG/0.5ML SOPN Inject 3 mg as directed once a week. 10/15/20   Whitmire, Thermon Leyland, FNP  EPINEPHrine 0.3 mg/0.3 mL IJ SOAJ injection Inject 0.3 mg into the muscle as needed for anaphylaxis. 04/28/20   Anson Fret, MD  estradiol (ESTRACE) 0.5 MG tablet Take 0.5 mg by mouth daily.  07/20/19   [provider]  fluticasone (FLONASE) 50 MCG/ACT nasal spray PLACE 1 SPRAY INTO BOTH NOSTRILS AS NEEDED (ALLERGIES/RUNNY NOSE). 10/22/20   Whitmire, Dawn W, FNP  Galcanezumab-gnlm (EMGALITY) 120 MG/ML SOAJ Inject 120 mg into the skin every 30 (thirty) days. 04/28/20   Anson Fret, MD  hydrochlorothiazide (MICROZIDE) 12.5 MG capsule TAKE 1 CAPSULE (12.5 MG TOTAL) BY MOUTH DAILY AS NEEDED (EDEMA). 10/22/20   Whitmire, Thermon Leyland, FNP  linaclotide (LINZESS) 145 MCG CAPS capsule Take 1 capsule (145 mcg total) by mouth daily before breakfast. 10/15/20   Whitmire, Thermon Leyland, FNP  meloxicam (MOBIC) 15 MG tablet Take 15 mg by mouth daily. 06/17/20   [provider]  montelukast (SINGULAIR) 10 MG tablet Take 1 tablet (10 mg total) by mouth at bedtime. 12/20/19   Whitmire, Dawn W, FNP  MOVANTIK 25 MG TABS tablet daily.  07/27/19   [provider]  Multiple Vitamin (MULTIVITAMIN) tablet Take 1 tablet by mouth daily.    [provider]  Oxycodone HCl 10 MG TABS Take 10 mg by mouth 4 (four) times daily. 10/06/20   [provider]  phentermine 15 MG capsule Take 1 capsule (15 mg total) by mouth every morning. 06/19/20   Helane Rima, DO  prazosin (MINIPRESS) 2 MG capsule Take 1 capsule (2 mg total) by mouth at bedtime. 09/18/20   Oletta Darter, MD  tapentadol HCl (NUCYNTA) 75 MG tablet Take 75 mg by mouth every 8 (eight) hours as needed.    [provider]  Thiamine HCl (VITAMIN B-1 PO) Take 1 tablet by mouth daily.     [provider]  Vitamin D, Ergocalciferol, (DRISDOL) 1.25 MG (50000 UNIT) CAPS capsule Take 1 capsule (50,000 Units total) by mouth every 7 (seven) days. 10/15/20   Whitmire, Thermon Leyland, FNP    Allergies    Bee venom, Coconut fatty acids, Mushroom ext cmplx(shiitake-reishi-mait), Nutritional supplements, Other, Shellfish allergy, Strawberry extract, Hydrocodone-acetaminophen, and Latex  Review of Systems   Review of Systems  HENT:       Loss of taste and smell  Respiratory: Positive for cough and shortness of breath.   Gastrointestinal: Positive for diarrhea, nausea and vomiting.  Musculoskeletal: Positive for myalgias.  Neurological: Positive for syncope.  All other systems reviewed and are negative.   Physical Exam Updated Vital Signs BP 111/72   Pulse 92   Temp 99.1 F (37.3 C)   Resp (!) 22   Ht  (1.676 m)   Wt 104.3 kg   SpO2 94%   BMI 37.12 kg/m   Physical Exam Vitals and nursing note reviewed.  Constitutional:      General: She is not in acute distress.  Appearance: She is well-developed.     Comments: Appears as if she feels ill  HENT:     Head: Normocephalic and atraumatic.  Eyes:     Extraocular Movements: Extraocular movements intact.     Conjunctiva/sclera: Conjunctivae normal.     Pupils: Pupils are equal, round, and reactive to light.  Cardiovascular:     Rate and Rhythm: Normal rate and regular rhythm.     Pulses: Normal  pulses.  Pulmonary:     Effort: Pulmonary effort is normal. No respiratory distress.     Breath sounds: Rhonchi present. No wheezing.     Comments: Coarse lung sounds, especially in the bases bilaterally.  Speaking in full sentences.  Sats 92 to 95% on room air when talking.  When patient stands up, sats dropped to 84% on room air. Abdominal:     General: There is no distension.     Palpations: Abdomen is soft. There is no mass.     Tenderness: There is no abdominal tenderness. There is no guarding or rebound.  Musculoskeletal:        General: Normal range of motion.     Cervical back: Normal range of motion and neck supple.  Skin:    General: Skin is warm and dry.     Capillary Refill: Capillary refill takes less than 2 seconds.  Neurological:     Mental Status: She is alert and oriented to person, place, and time.     ED Results / Procedures / Treatments   Labs (all labs ordered are listed, but only abnormal results are displayed) Labs Reviewed  RESP PANEL BY RT-PCR (FLU A&B, COVID) ARPGX2 - Abnormal; Notable for the following components:      Result Value   SARS Coronavirus 2 by RT PCR POSITIVE (*)    All other components within normal limits  BASIC METABOLIC PANEL - Abnormal; Notable for the following components:   BUN 28 (*)    Creatinine, Ser 1.45 (*)    Calcium 8.4 (*)    GFR, Estimated 42 (*)    All other components within normal limits  CBC WITH DIFFERENTIAL/PLATELET - Abnormal; Notable for the following components:   Monocytes Absolute 1.2 (*)    All other components within normal limits  D-DIMER, QUANTITATIVE - Abnormal; Notable for the following components:   D-Dimer, Quant 0.87 (*)    All other components within normal limits  FIBRINOGEN - Abnormal; Notable for the following components:   Fibrinogen 552 (*)    All other components within normal limits  C-REACTIVE PROTEIN - Abnormal; Notable for the following components:   CRP 13.2 (*)    All other components  within normal limits  CULTURE, BLOOD (ROUTINE X 2)  CULTURE, BLOOD (ROUTINE X 2)  LACTIC ACID, PLASMA  PROCALCITONIN  LACTATE DEHYDROGENASE  FERRITIN  TRIGLYCERIDES  DIFFERENTIAL  LACTIC ACID, PLASMA  CBC    EKG None  Radiology DG Chest Portable 1 View  Result Date: 10/25/2020 CLINICAL DATA:  Shortness of breath. Cough and intermittent chest pain, body aches, vomiting. EXAM: PORTABLE CHEST 1 VIEW COMPARISON:  Chest x-ray dated 03/19/2019. FINDINGS: New streaky opacities within the RIGHT upper lobe and at the bilateral lung bases. Heart size and mediastinal contours are stable. Osseous structures about the chest are unremarkable. IMPRESSION: New streaky opacities within the RIGHT upper lobe and at the bilateral lung bases, suspicious for multifocal pneumonia. Electronically Signed   By: Bary Richard M.D.   On: 10/25/2020 15:52  Procedures Procedures   Medications Ordered in ED Medications  sodium chloride 0.9 % bolus 500 mL (500 mLs Intravenous New Bag/Given 10/25/20 2044)  ondansetron (ZOFRAN) injection 4 mg (4 mg Intravenous Given 10/25/20 2045)  ketorolac (TORADOL) 15 MG/ML injection 15 mg (15 mg Intravenous Given 10/25/20 2045)  dexamethasone (DECADRON) injection 6 mg (6 mg Intravenous Given 10/25/20 2045)  albuterol (VENTOLIN HFA) 108 (90 Base) MCG/ACT inhaler 4 puff (4 puffs Inhalation Given 10/25/20 2045)    ED Course  I have reviewed the triage vital signs and the nursing notes.  Pertinent labs & imaging results that were available during my care of the patient were reviewed by me and considered in my medical decision making (see chart for details).    MDM Rules/Calculators/A&P                          Patient presented for evaluation of COVID-like symptoms.  On exam, patient appears as if she feels ill, but is not in acute distress.  However she has concerning pulmonary exam and that she has coarse lung sounds and when she has minimal exertion such as standing up in the  bed, she desats.  She would likely need to be admitted for further evaluation management of her hypoxia.  Consider COVID versus multifocal pneumonia versus PE. Chest x-ray obtained from triage viewed and independently interpreted by me, shows multifocal opacities consistent with pneumonia.  COVID test is pending.  Labs obtained in triage interpreted by me.  No leukocytosis.  There is mild bump in patient's creatinine consistent with dehydration.  Patient's COVID test is positive.  This is likely the cause for her symptoms.  Her D-dimer is minimally elevated, but likely due to COVID.  As it is not significantly elevated, low suspicion for PE.  Will call for admission for COVID hypoxia and dehydration.  Discussed with Dr. Antionette Char from [facility] hospitalist service, patient to be admitted.  Final Clinical Impression(s) / ED Diagnoses Final diagnoses:  COVID-19  Hypoxia  Elevated serum creatinine    Rx / DC Orders ED Discharge Orders    None       Alveria Apley, PA-C 10/25/20 2222    Koleen Distance, MD 10/26/20 [phone]

## 2020-10-25 NOTE — ED Triage Notes (Signed)
C/O cough, sore throat, body aches, fatigue, nausea onset 2 days ago. No known fevers.

## 2020-10-25 NOTE — ED Notes (Signed)
Pt assisted to car with staff and fiance.  Fiance notified to take pt directly to the ED; verbalized understanding.

## 2020-10-25 NOTE — ED Provider Notes (Signed)
Emergency Medicine Provider Triage Evaluation Note  [name] , a 55 y.o. female  was evaluated in triage.  Pt complains of 2-week history of cough, shortness of breath, intermittent chest pain, sore throat, body aches, vomiting.  She is concerned that she has COVID.  Sent to the ER from urgent care for hypoxia.  Review of Systems  Positive: Cough, shortness of breath, intermittent chest pain, sore throat, body aches Negative:   Physical Exam  BP 111/73 (BP Location: Left Arm)   Pulse (!) 114   Temp 98.6 F (37 C) (Oral)   Resp 16   SpO2 97%  Gen:   Awake, no distress   Resp:  Normal effort  MSK:   Moves extremities without difficulty  Other:  Tachycardic  Medical Decision Making  Medically screening exam initiated at 3:26 PM.  Appropriate orders placed.  [name] was informed that the remainder of the evaluation will be completed by another provider, this initial triage assessment does not replace that evaluation, and the importance of remaining in the ED until their evaluation is complete.  Will order COVID test   Dietrich Pates, PA-C 10/25/20 1528    Lorre Nick, MD 10/26/20 [phone]

## 2020-10-25 NOTE — ED Notes (Signed)
Pt declining ambulance transport to ED; states her fiance will take her immediately to ED.  Patient is being discharged from the Urgent Care Center and sent to the Emergency Department via private vehicle with fiance (pt declining ambulance transport). Per M. Mani, [place], patient is stable but in need of higher level of care due to hypoxia, fatigue, body aches, cough, sore throat. Patient is aware and verbalizes understanding of plan of care.  Vitals:   10/25/20 1418 10/25/20 1430  BP: 106/72   Pulse: (!) 101   Resp: (!) 22   SpO2: (!) 88% 95%

## 2020-10-26 ENCOUNTER — Encounter (HOSPITAL_COMMUNITY): Payer: Self-pay | Admitting: Family Medicine

## 2020-10-26 LAB — CBC
HCT: 34.5 % — ABNORMAL LOW (ref 36.0–46.0)
Hemoglobin: 10.7 g/dL — ABNORMAL LOW (ref 12.0–15.0)
MCH: 28.3 pg (ref 26.0–34.0)
MCHC: 31 g/dL (ref 30.0–36.0)
MCV: 91.3 fL (ref 80.0–100.0)
Platelets: 202 10*3/uL (ref 150–400)
RBC: 3.78 MIL/uL — ABNORMAL LOW (ref 3.87–5.11)
RDW: 14 % (ref 11.5–15.5)
WBC: 6.6 10*3/uL (ref 4.0–10.5)
nRBC: 0 % (ref 0.0–0.2)

## 2020-10-26 LAB — COMPREHENSIVE METABOLIC PANEL
ALT: 16 U/L (ref 0–44)
AST: 22 U/L (ref 15–41)
Albumin: 3.7 g/dL (ref 3.5–5.0)
Alkaline Phosphatase: 66 U/L (ref 38–126)
Anion gap: 8 (ref 5–15)
BUN: 20 mg/dL (ref 6–20)
CO2: 24 mmol/L (ref 22–32)
Calcium: 8.7 mg/dL — ABNORMAL LOW (ref 8.9–10.3)
Chloride: 107 mmol/L (ref 98–111)
Creatinine, Ser: 0.91 mg/dL (ref 0.44–1.00)
GFR, Estimated: >60 mL/min (ref >60)
Glucose, Bld: 157 mg/dL — ABNORMAL HIGH (ref 70–99)
Potassium: 4.3 mmol/L (ref 3.5–5.1)
Sodium: 139 mmol/L (ref 135–145)
Total Bilirubin: 0.4 mg/dL (ref 0.3–1.2)
Total Protein: 8.1 g/dL (ref 6.5–8.1)

## 2020-10-26 LAB — CBC WITH DIFFERENTIAL/PLATELET
Abs Immature Granulocytes: 0.02 10*3/uL (ref 0.00–0.07)
Basophils Absolute: 0 10*3/uL (ref 0.0–0.1)
Basophils Relative: 0 %
Eosinophils Absolute: 0 10*3/uL (ref 0.0–0.5)
Eosinophils Relative: 0 %
HCT: 37.1 % (ref 36.0–46.0)
Hemoglobin: 11.5 g/dL — ABNORMAL LOW (ref 12.0–15.0)
Immature Granulocytes: 0 %
Lymphocytes Relative: 11 %
Lymphs Abs: 0.5 10*3/uL — ABNORMAL LOW (ref 0.7–4.0)
MCH: 27.7 pg (ref 26.0–34.0)
MCHC: 31 g/dL (ref 30.0–36.0)
MCV: 89.4 fL (ref 80.0–100.0)
Monocytes Absolute: 0.2 10*3/uL (ref 0.1–1.0)
Monocytes Relative: 4 %
Neutro Abs: 4.1 10*3/uL (ref 1.7–7.7)
Neutrophils Relative %: 85 %
Platelets: 216 10*3/uL (ref 150–400)
RBC: 4.15 MIL/uL (ref 3.87–5.11)
RDW: 13.7 % (ref 11.5–15.5)
WBC: 4.9 10*3/uL (ref 4.0–10.5)
nRBC: 0 % (ref 0.0–0.2)

## 2020-10-26 LAB — MAGNESIUM: Magnesium: 2.5 mg/dL — ABNORMAL HIGH (ref 1.7–2.4)

## 2020-10-26 LAB — D-DIMER, QUANTITATIVE: D-Dimer, Quant: 0.68 ug/mL-FEU — ABNORMAL HIGH (ref 0.00–0.50)

## 2020-10-26 LAB — C-REACTIVE PROTEIN: CRP: 13.4 mg/dL — ABNORMAL HIGH (ref <1.0)

## 2020-10-26 LAB — PHOSPHORUS: Phosphorus: 3.1 mg/dL (ref 2.5–4.6)

## 2020-10-26 LAB — PROCALCITONIN: Procalcitonin: <0.1 ng/mL

## 2020-10-26 LAB — HIV ANTIBODY (ROUTINE TESTING W REFLEX): HIV Screen 4th Generation wRfx: NONREACTIVE

## 2020-10-26 MED ORDER — ENOXAPARIN SODIUM 60 MG/0.6ML IJ SOSY
50.0000 mg | PREFILLED_SYRINGE | INTRAMUSCULAR | Status: DC
  Administered 2020-10-27 – 2020-10-28 (×2): 50 mg via SUBCUTANEOUS
  Filled 2020-10-26 (×2): qty 0.6

## 2020-10-26 NOTE — ED Notes (Signed)
Breakfast and pillows provided.

## 2020-10-26 NOTE — ED Notes (Signed)
Pt in bed eating breakfast, A&O X4, respirations even and unlabored.

## 2020-10-26 NOTE — Progress Notes (Signed)
Progress Note    [name]  VOJ:[id number] DOB: [birth year]  DOA: 10/25/2020 PCP: Iona Hansen, NP      Brief Narrative:    Medical records reviewed and are as summarized below:  [name] is a 55 y.o. female  with medical history significant for asthma, chronic pain, bipolar disorder, anxiety, insomnia, OSA, BMI 37, and gastric bypass in 2008, who presented to the hospital with cough, sore throat, fatigue, headache and malaise.  She has received 3 doses of the Pfizer vaccine series.  She was hypoxic in the emergency room with oxygen saturation of 88% on room air.  Chest x-ray showed multifocal pneumonia.  She tested positive for COVID-19 infection.  She was admitted to the hospital for acute COVID-19 pneumonia complicated by acute hypoxic respiratory failure.      Assessment/Plan:   Principal Problem:   Acute respiratory disease due to COVID-19 virus Active Problems:   Asthma   Depression   Chronic pain   GAD (generalized anxiety disorder)   AKI (acute kidney injury) (HCC)   Body mass index is 37.12 kg/m.  (Morbid obesity)  COVID-19 pneumonia: Continue IV remdesivir and steroids.  Follow-up inflammatory markers.  She is fully vaccinated against COVID-19 infection.  Acute hypoxic respiratory failure: continue oxygen via nasal cannula as needed.  AKI: Improved.  Discontinue IV fluids  Other comorbidities include asthma, depression, anxiety, insomnia, chronic pain.   Diet Order            Diet Heart Room service appropriate? Yes; Fluid consistency: Thin  Diet effective now                    Consultants:  None  Procedures:  None    Medications:   . buPROPion  300 mg Oral Daily  . divalproex  500 mg Oral QHS  . doxepin  100 mg Oral QHS  . [START ON 10/27/2020] enoxaparin (LOVENOX) injection  50 mg Subcutaneous Q24H  . linaclotide  145 mcg Oral QAC breakfast  . methylPREDNISolone (SOLU-MEDROL) injection  60 mg Intravenous Q8H    Followed by  . [START ON 10/29/2020] predniSONE  50 mg Oral Daily  . mometasone-formoterol  2 puff Inhalation BID  . montelukast  10 mg Oral QHS  . naloxegol oxalate  12.5 mg Oral Daily  . prazosin  2 mg Oral QHS   Continuous Infusions: . remdesivir 100 mg in NS 100 mL 100 mg (10/26/20 0955)     Anti-infectives (From admission, onward)   Start     Dose/Rate Route Frequency Ordered Stop   10/26/20 1000  remdesivir 100 mg in sodium chloride 0.9 % 100 mL IVPB  Status:  Discontinued       "Followed by" Linked Group Details   100 mg 200 mL/hr over 30 Minutes Intravenous Daily 10/25/20 2228 10/25/20 2233   10/26/20 1000  remdesivir 100 mg in sodium chloride 0.9 % 100 mL IVPB       "Followed by" Linked Group Details   100 mg 200 mL/hr over 30 Minutes Intravenous Daily 10/25/20 2234 10/30/20 0959   10/25/20 2300  remdesivir 100 mg in sodium chloride 0.9 % 100 mL IVPB       "Followed by" Linked Group Details   100 mg 200 mL/hr over 30 Minutes Intravenous Every 30 min 10/25/20 2234 10/26/20 0149   10/25/20 2230  remdesivir 200 mg in sodium chloride 0.9% 250 mL IVPB  Status:  Discontinued       "  Followed by" Linked Group Details   200 mg 580 mL/hr over 30 Minutes Intravenous Once 10/25/20 2228 10/25/20 2233             Family Communication/Anticipated D/C date and plan/Code Status   DVT prophylaxis:      Code Status: Full Code  Family Communication: None Disposition Plan:    Status is: Inpatient  Remains inpatient appropriate because:IV treatments appropriate due to intensity of illness or inability to take PO and Inpatient level of care appropriate due to severity of illness   Dispo: The patient is from: Home              Anticipated d/c is to: Home              Patient currently is not medically stable to d/c.   Difficult to place patient No           Subjective:   C/o cough and mucus production.  Objective:    Vitals:   10/26/20 1300 10/26/20 1330  10/26/20 1400 10/26/20 1430  BP: 111/77 124/78 131/68 129/88  Pulse: 66 88 88 92  Resp: 17 (!) 21 (!) 24 (!) 31  Temp:      TempSrc:      SpO2: 92% 94% 94% 93%  Weight:      Height:       No data found.  No intake or output data in the 24 hours ending 10/26/20 1543 Filed Weights   10/25/20 1527  Weight: 104.3 kg    Exam:  GEN: NAD SKIN: Warm and dry EYES: EOMI ENT: MMM CV: RRR PULM: CTA B ABD: soft, obese, NT, +BS CNS: AAO x 3, non focal EXT: No edema or tenderness        Data Reviewed:   I have personally reviewed following labs and imaging studies:  Labs: Labs show the following:   Basic Metabolic Panel: Recent Labs  Lab 10/25/20 1554 10/26/20 0449  NA 136 139  K 3.9 4.3  CL 102 107  CO2 24 24  GLUCOSE 87 157*  BUN 28* 20  CREATININE 1.45* 0.91  CALCIUM 8.4* 8.7*  MG  --  2.5*  PHOS  --  3.1   GFR Estimated Creatinine Clearance: 83.2 mL/min (by C-G formula based on SCr of 0.91 mg/dL). Liver Function Tests: Recent Labs  Lab 10/26/20 0449  AST 22  ALT 16  ALKPHOS 66  BILITOT 0.4  PROT 8.1  ALBUMIN 3.7   No results for input(s): LIPASE, AMYLASE in the last 168 hours. No results for input(s): AMMONIA in the last 168 hours. Coagulation profile No results for input(s): INR, PROTIME in the last 168 hours.  CBC: Recent Labs  Lab 10/25/20 1554 10/25/20 2030 10/26/20 0449  WBC 8.1 6.6 4.9  NEUTROABS 5.7 4.5 4.1  HGB 12.0 10.7* 11.5*  HCT 38.3 34.5* 37.1  MCV 90.5 91.3 89.4  PLT 215 202 216   Cardiac Enzymes: No results for input(s): CKTOTAL, CKMB, CKMBINDEX, TROPONINI in the last 168 hours. BNP (last 3 results) No results for input(s): PROBNP in the last 8760 hours. CBG: No results for input(s): GLUCAP in the last 168 hours. D-Dimer: Recent Labs    10/25/20 2030 10/26/20 0449  DDIMER 0.87* 0.68*   Hgb A1c: No results for input(s): HGBA1C in the last 72 hours. Lipid Profile: Recent Labs    10/25/20 1940  TRIG 92    Thyroid function studies: No results for input(s): TSH, T4TOTAL, T3FREE, THYROIDAB in the last  72 hours.  Invalid input(s): FREET3 Anemia work up: Recent Labs    10/25/20 1940  FERRITIN 66   Sepsis Labs: Recent Labs  Lab 10/25/20 1554 10/25/20 1940 10/25/20 2030 10/26/20 0449  PROCALCITON  --   --  <0.10 <0.10  WBC 8.1  --  6.6 4.9  LATICACIDVEN  --  0.7  --   --     Microbiology Recent Results (from the past 240 hour(s))  Resp Panel by RT-PCR (Flu A&B, Covid) Nasopharyngeal Swab     Status: Abnormal   Collection Time: 10/25/20  3:29 PM   Specimen: Nasopharyngeal Swab; Nasopharyngeal(NP) swabs in vial transport medium  Result Value Ref Range Status   SARS Coronavirus 2 by RT PCR POSITIVE (A) NEGATIVE Final    Comment: RESULT CALLED TO, READ BACK BY AND VERIFIED WITH: SARA DOSTER AT 2042 ON 09/24/20 BY MAJ (NOTE) SARS-CoV-2 target nucleic acids are DETECTED.  The SARS-CoV-2 RNA is generally detectable in upper respiratory specimens during the acute phase of infection. Positive results are indicative of the presence of the identified virus, but do not rule out bacterial infection or co-infection with other pathogens not detected by the test. Clinical correlation with patient history and other diagnostic information is necessary to determine patient infection status. The expected result is Negative.  Fact Sheet for Patients: BloggerCourse.com  Fact Sheet for Healthcare Providers: SeriousBroker.it  This test is not yet approved or cleared by the [place] FDA and  has been authorized for detection and/or diagnosis of SARS-CoV-2 by FDA under an Emergency Use Authorization (EUA).  This EUA will remain in effect (meaning this test c an be used) for the duration of  the COVID-19 declaration under Section 564(b)(1) of the Act, 21 U.S.C. section 360bbb-3(b)(1), unless the authorization is terminated or revoked  sooner.     Influenza A by PCR NEGATIVE NEGATIVE Final   Influenza B by PCR NEGATIVE NEGATIVE Final    Comment: (NOTE) The Xpert Xpress SARS-CoV-2/FLU/RSV plus assay is intended as an aid in the diagnosis of influenza from Nasopharyngeal swab specimens and should not be used as a sole basis for treatment. Nasal washings and aspirates are unacceptable for Xpert Xpress SARS-CoV-2/FLU/RSV testing.  Fact Sheet for Patients: BloggerCourse.com  Fact Sheet for Healthcare Providers: SeriousBroker.it  This test is not yet approved or cleared by the [place] FDA and has been authorized for detection and/or diagnosis of SARS-CoV-2 by FDA under an Emergency Use Authorization (EUA). This EUA will remain in effect (meaning this test can be used) for the duration of the COVID-19 declaration under Section 564(b)(1) of the Act, 21 U.S.C. section 360bbb-3(b)(1), unless the authorization is terminated or revoked.  Performed at [facility], [address] [place], [place] [address]     Procedures and diagnostic studies:  DG Chest Portable 1 View  Result Date: 10/25/2020 CLINICAL DATA:  Shortness of breath. Cough and intermittent chest pain, body aches, vomiting. EXAM: PORTABLE CHEST 1 VIEW COMPARISON:  Chest x-ray dated 03/19/2019. FINDINGS: New streaky opacities within the RIGHT upper lobe and at the bilateral lung bases. Heart size and mediastinal contours are stable. Osseous structures about the chest are unremarkable. IMPRESSION: New streaky opacities within the RIGHT upper lobe and at the bilateral lung bases, suspicious for multifocal pneumonia. Electronically Signed   By: Bary Richard M.D.   On: 10/25/2020 15:52               LOS: 1 day   Makell Drohan  [facility]   Pager on [email or web address]. If 7PM-7AM, please contact night-coverage at [email or web address]     10/26/2020, 3:43 PM

## 2020-10-26 NOTE — Plan of Care (Signed)
  Problem: Education: Goal: Knowledge of General Education information will improve Description: Including pain rating scale, medication(s)/side effects and non-pharmacologic comfort measures Outcome: Progressing   Problem: Health Behavior/Discharge Planning: Goal: Ability to manage health-related needs will improve Outcome: Progressing   Problem: Clinical Measurements: Goal: Ability to maintain clinical measurements within normal limits will improve Outcome: Progressing Goal: Will remain free from infection Outcome: Progressing Goal: Diagnostic test results will improve Outcome: Progressing Goal: Respiratory complications will improve Outcome: Progressing Goal: Cardiovascular complication will be avoided Outcome: Progressing   Problem: Activity: Goal: Risk for activity intolerance will decrease Outcome: Progressing   Problem: Coping: Goal: Level of anxiety will decrease Outcome: Progressing   Problem: Pain Managment: Goal: General experience of comfort will improve Outcome: Progressing   Problem: Safety: Goal: Ability to remain free from injury will improve Outcome: Progressing   Problem: Education: Goal: Knowledge of risk factors and measures for prevention of condition will improve Outcome: Progressing   Problem: Coping: Goal: Psychosocial and spiritual needs will be supported Outcome: Progressing   Problem: Respiratory: Goal: Will maintain a patent airway Outcome: Progressing Goal: Complications related to the disease process, condition or treatment will be avoided or minimized Outcome: Progressing   

## 2020-10-27 DIAGNOSIS — J45901 Unspecified asthma with (acute) exacerbation: Secondary | ICD-10-CM

## 2020-10-27 LAB — C-REACTIVE PROTEIN: CRP: 4 mg/dL — ABNORMAL HIGH (ref <1.0)

## 2020-10-27 LAB — CBC WITH DIFFERENTIAL/PLATELET
Abs Immature Granulocytes: 0.03 10*3/uL (ref 0.00–0.07)
Basophils Absolute: 0 10*3/uL (ref 0.0–0.1)
Basophils Relative: 0 %
Eosinophils Absolute: 0 10*3/uL (ref 0.0–0.5)
Eosinophils Relative: 0 %
HCT: 34.9 % — ABNORMAL LOW (ref 36.0–46.0)
Hemoglobin: 11 g/dL — ABNORMAL LOW (ref 12.0–15.0)
Immature Granulocytes: 0 %
Lymphocytes Relative: 7 %
Lymphs Abs: 0.6 10*3/uL — ABNORMAL LOW (ref 0.7–4.0)
MCH: 28.2 pg (ref 26.0–34.0)
MCHC: 31.5 g/dL (ref 30.0–36.0)
MCV: 89.5 fL (ref 80.0–100.0)
Monocytes Absolute: 0.7 10*3/uL (ref 0.1–1.0)
Monocytes Relative: 8 %
Neutro Abs: 7.3 10*3/uL (ref 1.7–7.7)
Neutrophils Relative %: 85 %
Platelets: 227 10*3/uL (ref 150–400)
RBC: 3.9 MIL/uL (ref 3.87–5.11)
RDW: 13.8 % (ref 11.5–15.5)
WBC: 8.6 10*3/uL (ref 4.0–10.5)
nRBC: 0 % (ref 0.0–0.2)

## 2020-10-27 LAB — COMPREHENSIVE METABOLIC PANEL
ALT: 14 U/L (ref 0–44)
AST: 17 U/L (ref 15–41)
Albumin: 3.3 g/dL — ABNORMAL LOW (ref 3.5–5.0)
Alkaline Phosphatase: 52 U/L (ref 38–126)
Anion gap: 9 (ref 5–15)
BUN: 14 mg/dL (ref 6–20)
CO2: 27 mmol/L (ref 22–32)
Calcium: 8.8 mg/dL — ABNORMAL LOW (ref 8.9–10.3)
Chloride: 107 mmol/L (ref 98–111)
Creatinine, Ser: 0.76 mg/dL (ref 0.44–1.00)
GFR, Estimated: >60 mL/min (ref >60)
Glucose, Bld: 130 mg/dL — ABNORMAL HIGH (ref 70–99)
Potassium: 4.3 mmol/L (ref 3.5–5.1)
Sodium: 143 mmol/L (ref 135–145)
Total Bilirubin: 0.3 mg/dL (ref 0.3–1.2)
Total Protein: 7.3 g/dL (ref 6.5–8.1)

## 2020-10-27 LAB — D-DIMER, QUANTITATIVE: D-Dimer, Quant: 0.36 ug/mL-FEU (ref 0.00–0.50)

## 2020-10-27 MED ORDER — PHENOL 1.4 % MT LIQD
1.0000 | OROMUCOSAL | Status: DC | PRN
  Administered 2020-10-27: 1 via OROMUCOSAL
  Filled 2020-10-27: qty 177

## 2020-10-27 MED ORDER — HYDROCODONE BIT-HOMATROP MBR 5-1.5 MG/5ML PO SOLN
5.0000 mL | Freq: Four times a day (QID) | ORAL | Status: DC | PRN
  Administered 2020-10-27 – 2020-10-28 (×2): 5 mL via ORAL
  Filled 2020-10-27 (×3): qty 5

## 2020-10-27 NOTE — TOC Progression Note (Signed)
Transition of Care [facility]) - Progression Note    Patient Details  Name: [name] MRN: [id number] Date of Birth: Jul 14, 1965  [facility] CM/SW Contact  Geni Bers, RN Phone Number: 10/27/2020, 3:48 PM  Clinical Narrative:     Pt from home with spouse and plan to return home.   Expected Discharge Plan: Home/Self Care Barriers to Discharge: No Barriers Identified  Expected Discharge Plan and Services Expected Discharge Plan: Home/Self Care       Living arrangements for the past 2 months: Single Family Home                                       Social Determinants of Health (SDOH) Interventions    Readmission Risk Interventions No flowsheet data found.

## 2020-10-27 NOTE — Plan of Care (Signed)
  Problem: Education: Goal: Knowledge of General Education information will improve Description: Including pain rating scale, medication(s)/side effects and non-pharmacologic comfort measures Outcome: Progressing   Problem: Health Behavior/Discharge Planning: Goal: Ability to manage health-related needs will improve Outcome: Progressing   Problem: Clinical Measurements: Goal: Diagnostic test results will improve Outcome: Progressing   

## 2020-10-27 NOTE — Progress Notes (Addendum)
Progress Note    [name]  JEH:[id number] DOB: 03-Dec-1965  DOA: 10/25/2020 PCP: Iona Hansen, NP      Brief Narrative:    Medical records reviewed and are as summarized below:  [name] is a 55 y.o. female  with medical history significant for asthma, chronic pain, bipolar disorder, anxiety, insomnia, OSA, BMI 37, and gastric bypass in 2008, who presented to the hospital with cough, sore throat, fatigue, headache and malaise.  She has received 3 doses of the Pfizer vaccine series.  She was hypoxic in the emergency room with oxygen saturation of 88% on room air.  Chest x-ray showed multifocal pneumonia.  She tested positive for COVID-19 infection.  She was admitted to the hospital for acute COVID-19 pneumonia complicated by acute hypoxic respiratory failure.      Assessment/Plan:   Principal Problem:   Acute respiratory disease due to COVID-19 virus Active Problems:   Asthma   Depression   Chronic pain   GAD (generalized anxiety disorder)   AKI (acute kidney injury) (HCC)   Body mass index is 37.12 kg/m.  (Morbid obesity)  COVID-19 pneumonia: Continue IV remdesivir and steroids.  CRP and D-dimer levels are trending down.  She is fully vaccinated against COVID-19 infection.  Asthma exacerbation: Continue bronchodilators and steroids.  Acute hypoxic respiratory failure: Resolved.  Oxygen saturation on room air while ambulating was 95%.   AKI: Resolved  Other comorbidities include asthma, depression, anxiety, insomnia, chronic pain.  Attempt was made to discharge patient home today.  She was able to ambulate on room air.  However, she developed significant coughing, wheezing and shortness of breath.  Because of this, she was worried about going home today and preferred to stay overnight to see how she does.   Diet Order            Diet Heart Room service appropriate? Yes; Fluid consistency: Thin  Diet effective now                     Consultants:  None  Procedures:  None    Medications:   . buPROPion  300 mg Oral Daily  . divalproex  500 mg Oral QHS  . doxepin  100 mg Oral QHS  . enoxaparin (LOVENOX) injection  50 mg Subcutaneous Q24H  . linaclotide  145 mcg Oral QAC breakfast  . methylPREDNISolone (SOLU-MEDROL) injection  60 mg Intravenous Q8H   Followed by  . [START ON 10/29/2020] predniSONE  50 mg Oral Daily  . mometasone-formoterol  2 puff Inhalation BID  . montelukast  10 mg Oral QHS  . naloxegol oxalate  12.5 mg Oral Daily  . prazosin  2 mg Oral QHS   Continuous Infusions: . remdesivir 100 mg in NS 100 mL 100 mg (10/27/20 0956)     Anti-infectives (From admission, onward)   Start     Dose/Rate Route Frequency Ordered Stop   10/26/20 1000  remdesivir 100 mg in sodium chloride 0.9 % 100 mL IVPB  Status:  Discontinued       "Followed by" Linked Group Details   100 mg 200 mL/hr over 30 Minutes Intravenous Daily 10/25/20 2228 10/25/20 2233   10/26/20 1000  remdesivir 100 mg in sodium chloride 0.9 % 100 mL IVPB       "Followed by" Linked Group Details   100 mg 200 mL/hr over 30 Minutes Intravenous Daily 10/25/20 2234 10/30/20 0959   10/25/20 2300  remdesivir 100 mg  in sodium chloride 0.9 % 100 mL IVPB       "Followed by" Linked Group Details   100 mg 200 mL/hr over 30 Minutes Intravenous Every 30 min 10/25/20 2234 10/26/20 0149   10/25/20 2230  remdesivir 200 mg in sodium chloride 0.9% 250 mL IVPB  Status:  Discontinued       "Followed by" Linked Group Details   200 mg 580 mL/hr over 30 Minutes Intravenous Once 10/25/20 2228 10/25/20 2233             Family Communication/Anticipated D/C date and plan/Code Status   DVT prophylaxis:      Code Status: Full Code  Family Communication: None Disposition Plan:    Status is: Inpatient  Remains inpatient appropriate because:IV treatments appropriate due to intensity of illness or inability to take PO and Inpatient level of  care appropriate due to severity of illness   Dispo: The patient is from: Home              Anticipated d/c is to: Home              Patient currently is not medically stable to d/c.   Difficult to place patient No           Subjective:   C/o cough, watery stools (x2 today), abdominal pain.  Shortness of breath is a little better today.   Objective:    Vitals:   10/26/20 2356 10/27/20 0507 10/27/20 0915 10/27/20 1317  BP: 129/82 123/77 124/78 127/65  Pulse: 93 79 81 97  Resp: 16 16 20 20   Temp: 98.9 F (37.2 C) 98.7 F (37.1 C) 98.4 F (36.9 C) 98.6 F (37 C)  TempSrc: Oral Oral Oral Oral  SpO2:  91% 93% 95%  Weight:      Height:       No data found.  No intake or output data in the 24 hours ending 10/27/20 1651 Filed Weights   10/25/20 1527  Weight: 104.3 kg    Exam:  GEN: NAD SKIN: Warm and dry EYES: EOMI ENT: MMM CV: RRR PULM: No wheezing or rales heard ABD: soft, obese, NT, +BS CNS: AAO x 3, non focal EXT: No edema or tenderness        Data Reviewed:   I have personally reviewed following labs and imaging studies:  Labs: Labs show the following:   Basic Metabolic Panel: Recent Labs  Lab 10/25/20 1554 10/26/20 0449 10/27/20 0351  NA 136 139 143  K 3.9 4.3 4.3  CL 102 107 107  CO2 24 24 27   GLUCOSE 87 157* 130*  BUN 28* 20 14  CREATININE 1.45* 0.91 0.76  CALCIUM 8.4* 8.7* 8.8*  MG  --  2.5*  --   PHOS  --  3.1  --    GFR Estimated Creatinine Clearance: 94.7 mL/min (by C-G formula based on SCr of 0.76 mg/dL). Liver Function Tests: Recent Labs  Lab 10/26/20 0449 10/27/20 0351  AST 22 17  ALT 16 14  ALKPHOS 66 52  BILITOT 0.4 0.3  PROT 8.1 7.3  ALBUMIN 3.7 3.3*   No results for input(s): LIPASE, AMYLASE in the last 168 hours. No results for input(s): AMMONIA in the last 168 hours. Coagulation profile No results for input(s): INR, PROTIME in the last 168 hours.  CBC: Recent Labs  Lab 10/25/20 1554  10/25/20 2030 10/26/20 0449 10/27/20 0351  WBC 8.1 6.6 4.9 8.6  NEUTROABS 5.7 4.5 4.1 7.3  HGB 12.0 10.7*  11.5* 11.0*  HCT 38.3 34.5* 37.1 34.9*  MCV 90.5 91.3 89.4 89.5  PLT 215 202 216 227   Cardiac Enzymes: No results for input(s): CKTOTAL, CKMB, CKMBINDEX, TROPONINI in the last 168 hours. BNP (last 3 results) No results for input(s): PROBNP in the last 8760 hours. CBG: No results for input(s): GLUCAP in the last 168 hours. D-Dimer: Recent Labs    10/26/20 0449 10/27/20 0351  DDIMER 0.68* 0.36   Hgb A1c: No results for input(s): HGBA1C in the last 72 hours. Lipid Profile: Recent Labs    10/25/20 1940  TRIG 92   Thyroid function studies: No results for input(s): TSH, T4TOTAL, T3FREE, THYROIDAB in the last 72 hours.  Invalid input(s): FREET3 Anemia work up: Recent Labs    10/25/20 1940  FERRITIN 66   Sepsis Labs: Recent Labs  Lab 10/25/20 1554 10/25/20 1940 10/25/20 2030 10/26/20 0449 10/27/20 0351  PROCALCITON  --   --  <0.10 <0.10  --   WBC 8.1  --  6.6 4.9 8.6  LATICACIDVEN  --  0.7  --   --   --     Microbiology Recent Results (from the past 240 hour(s))  Resp Panel by RT-PCR (Flu A&B, Covid) Nasopharyngeal Swab     Status: Abnormal   Collection Time: 10/25/20  3:29 PM   Specimen: Nasopharyngeal Swab; Nasopharyngeal(NP) swabs in vial transport medium  Result Value Ref Range Status   SARS Coronavirus 2 by RT PCR POSITIVE (A) NEGATIVE Final    Comment: RESULT CALLED TO, READ BACK BY AND VERIFIED WITH: SARA DOSTER AT 2042 ON 09/24/20 BY MAJ (NOTE) SARS-CoV-2 target nucleic acids are DETECTED.  The SARS-CoV-2 RNA is generally detectable in upper respiratory specimens during the acute phase of infection. Positive results are indicative of the presence of the identified virus, but do not rule out bacterial infection or co-infection with other pathogens not detected by the test. Clinical correlation with patient history and other diagnostic  information is necessary to determine patient infection status. The expected result is Negative.  Fact Sheet for Patients: BloggerCourse.com  Fact Sheet for Healthcare Providers: SeriousBroker.it  This test is not yet approved or cleared by the [place] FDA and  has been authorized for detection and/or diagnosis of SARS-CoV-2 by FDA under an Emergency Use Authorization (EUA).  This EUA will remain in effect (meaning this test c an be used) for the duration of  the COVID-19 declaration under Section 564(b)(1) of the Act, 21 U.S.C. section 360bbb-3(b)(1), unless the authorization is terminated or revoked sooner.     Influenza A by PCR NEGATIVE NEGATIVE Final   Influenza B by PCR NEGATIVE NEGATIVE Final    Comment: (NOTE) The Xpert Xpress SARS-CoV-2/FLU/RSV plus assay is intended as an aid in the diagnosis of influenza from Nasopharyngeal swab specimens and should not be used as a sole basis for treatment. Nasal washings and aspirates are unacceptable for Xpert Xpress SARS-CoV-2/FLU/RSV testing.  Fact Sheet for Patients: BloggerCourse.com  Fact Sheet for Healthcare Providers: SeriousBroker.it  This test is not yet approved or cleared by the [place] FDA and has been authorized for detection and/or diagnosis of SARS-CoV-2 by FDA under an Emergency Use Authorization (EUA). This EUA will remain in effect (meaning this test can be used) for the duration of the COVID-19 declaration under Section 564(b)(1) of the Act, 21 U.S.C. section 360bbb-3(b)(1), unless the authorization is terminated or revoked.  Performed at [facility], [address] [place], [place] [address]  Blood Culture (routine x 2)     Status: None (Preliminary result)   Collection Time: 10/25/20  7:40 PM   Specimen: BLOOD  Result Value Ref Range Status   Specimen Description   Final     BLOOD Blood Culture adequate volume Performed at [facility], [address], [place], [place] [address]    Special Requests   Final    BOTTLES DRAWN AEROBIC AND ANAEROBIC SITE NOT SPECIFIED Performed at [facility], [address], [place], [place] [address]    Culture   Final    NO GROWTH 1 DAY Performed at [facility], [address] [place], [place] [address]    Report Status PENDING  Incomplete    Procedures and diagnostic studies:  No results found.             LOS: 2 days   Kandi Brusseau  [facility]   Pager on [email or web address]. If 7PM-7AM, please contact night-coverage at [email or web address]     10/27/2020, 4:51 PM

## 2020-10-27 NOTE — Progress Notes (Signed)
SATURATION QUALIFICATIONS: (This note is used to comply with regulatory documentation for home oxygen)  Patient Saturations on Room Air at Rest 95%   Patient Saturations on Room Air while Ambulating 95  Patient Saturations on  N/A Liters of oxygen while Ambulating N/A  Please briefly explain why patient needs home oxygen:   Pt ambulated in the room and to bathroom, SOB noted with mild exertion. O2 sat on RA 95-96%.  SRP, RN

## 2020-10-27 NOTE — Progress Notes (Signed)
Pt coughing and congested, Tussinex given as ordered and Albuterol MDI given. SRP, RN

## 2020-10-28 DIAGNOSIS — J4521 Mild intermittent asthma with (acute) exacerbation: Secondary | ICD-10-CM

## 2020-10-28 LAB — CBC WITH DIFFERENTIAL/PLATELET
Abs Immature Granulocytes: 0.04 10*3/uL (ref 0.00–0.07)
Basophils Absolute: 0 10*3/uL (ref 0.0–0.1)
Basophils Relative: 0 %
Eosinophils Absolute: 0 10*3/uL (ref 0.0–0.5)
Eosinophils Relative: 0 %
HCT: 34.1 % — ABNORMAL LOW (ref 36.0–46.0)
Hemoglobin: 10.7 g/dL — ABNORMAL LOW (ref 12.0–15.0)
Immature Granulocytes: 0 %
Lymphocytes Relative: 6 %
Lymphs Abs: 0.6 10*3/uL — ABNORMAL LOW (ref 0.7–4.0)
MCH: 28.6 pg (ref 26.0–34.0)
MCHC: 31.4 g/dL (ref 30.0–36.0)
MCV: 91.2 fL (ref 80.0–100.0)
Monocytes Absolute: 0.6 10*3/uL (ref 0.1–1.0)
Monocytes Relative: 6 %
Neutro Abs: 8 10*3/uL — ABNORMAL HIGH (ref 1.7–7.7)
Neutrophils Relative %: 88 %
Platelets: 220 10*3/uL (ref 150–400)
RBC: 3.74 MIL/uL — ABNORMAL LOW (ref 3.87–5.11)
RDW: 14 % (ref 11.5–15.5)
WBC: 9.2 10*3/uL (ref 4.0–10.5)
nRBC: 0 % (ref 0.0–0.2)

## 2020-10-28 LAB — COMPREHENSIVE METABOLIC PANEL
ALT: 22 U/L (ref 0–44)
AST: 26 U/L (ref 15–41)
Albumin: 3.1 g/dL — ABNORMAL LOW (ref 3.5–5.0)
Alkaline Phosphatase: 44 U/L (ref 38–126)
Anion gap: 10 (ref 5–15)
BUN: 16 mg/dL (ref 6–20)
CO2: 27 mmol/L (ref 22–32)
Calcium: 8.4 mg/dL — ABNORMAL LOW (ref 8.9–10.3)
Chloride: 102 mmol/L (ref 98–111)
Creatinine, Ser: 0.79 mg/dL (ref 0.44–1.00)
GFR, Estimated: >60 mL/min (ref >60)
Glucose, Bld: 158 mg/dL — ABNORMAL HIGH (ref 70–99)
Potassium: 4 mmol/L (ref 3.5–5.1)
Sodium: 139 mmol/L (ref 135–145)
Total Bilirubin: 0.3 mg/dL (ref 0.3–1.2)
Total Protein: 6.7 g/dL (ref 6.5–8.1)

## 2020-10-28 LAB — C-REACTIVE PROTEIN: CRP: 1.2 mg/dL — ABNORMAL HIGH (ref <1.0)

## 2020-10-28 LAB — D-DIMER, QUANTITATIVE: D-Dimer, Quant: <0.27 ug/mL-FEU (ref 0.00–0.50)

## 2020-10-28 NOTE — Plan of Care (Signed)

## 2020-10-28 NOTE — Care Management Important Message (Signed)
Important Message  Patient Details IM Letter placed in Patient's door caddy. Name: [name] MRN: [id number] Date of Birth: [birth year]   Medicare Important Message Given:  Yes     Caren Macadam 10/28/2020, 12:32 PM

## 2020-10-28 NOTE — Plan of Care (Signed)
  Problem: Education: Goal: Knowledge of General Education information will improve Description: Including pain rating scale, medication(s)/side effects and non-pharmacologic comfort measures 10/28/2020 1203 by Charmian Muff, RN Outcome: Adequate for Discharge 10/28/2020 1202 by Charmian Muff, RN Outcome: Progressing   Problem: Health Behavior/Discharge Planning: Goal: Ability to manage health-related needs will improve 10/28/2020 1203 by Charmian Muff, RN Outcome: Adequate for Discharge 10/28/2020 1202 by Charmian Muff, RN Outcome: Progressing   Problem: Clinical Measurements: Goal: Ability to maintain clinical measurements within normal limits will improve 10/28/2020 1203 by Charmian Muff, RN Outcome: Adequate for Discharge 10/28/2020 1202 by Charmian Muff, RN Outcome: Progressing Goal: Will remain free from infection Outcome: Adequate for Discharge Goal: Diagnostic test results will improve Outcome: Adequate for Discharge Goal: Respiratory complications will improve Outcome: Adequate for Discharge Goal: Cardiovascular complication will be avoided Outcome: Adequate for Discharge   Problem: Activity: Goal: Risk for activity intolerance will decrease Outcome: Adequate for Discharge   Problem: Coping: Goal: Level of anxiety will decrease Outcome: Adequate for Discharge   Problem: Pain Managment: Goal: General experience of comfort will improve Outcome: Adequate for Discharge   Problem: Safety: Goal: Ability to remain free from injury will improve Outcome: Adequate for Discharge   Problem: Education: Goal: Knowledge of risk factors and measures for prevention of condition will improve Outcome: Adequate for Discharge   Problem: Coping: Goal: Psychosocial and spiritual needs will be supported Outcome: Adequate for Discharge   Problem: Respiratory: Goal: Will maintain a patent airway Outcome: Adequate for Discharge Goal: Complications related to the  disease process, condition or treatment will be avoided or minimized Outcome: Adequate for Discharge

## 2020-10-28 NOTE — Plan of Care (Signed)
  Problem: Education: Goal: Knowledge of General Education information will improve Description: Including pain rating scale, medication(s)/side effects and non-pharmacologic comfort measures Outcome: Progressing   Problem: Health Behavior/Discharge Planning: Goal: Ability to manage health-related needs will improve Outcome: Progressing   Problem: Clinical Measurements: Goal: Ability to maintain clinical measurements within normal limits will improve Outcome: Progressing Goal: Will remain free from infection Outcome: Progressing Goal: Diagnostic test results will improve Outcome: Progressing Goal: Respiratory complications will improve Outcome: Progressing Goal: Cardiovascular complication will be avoided Outcome: Progressing   Problem: Activity: Goal: Risk for activity intolerance will decrease Outcome: Progressing   Problem: Coping: Goal: Level of anxiety will decrease Outcome: Progressing   Problem: Pain Managment: Goal: General experience of comfort will improve Outcome: Progressing   Problem: Safety: Goal: Ability to remain free from injury will improve Outcome: Progressing   Problem: Education: Goal: Knowledge of risk factors and measures for prevention of condition will improve Outcome: Progressing   Problem: Coping: Goal: Psychosocial and spiritual needs will be supported Outcome: Progressing   Problem: Respiratory: Goal: Will maintain a patent airway Outcome: Progressing Goal: Complications related to the disease process, condition or treatment will be avoided or minimized Outcome: Progressing

## 2020-10-28 NOTE — Discharge Summary (Addendum)
Physician Discharge Summary  [name]:[id number] DOB: [birth year] DOA: 10/25/2020  PCP: Iona Hansen, NP  Admit date: 10/25/2020 Discharge date: 10/28/2020  Discharge disposition: Home   Recommendations for Outpatient Follow-Up:   Follow-up with PCP in 1 week  Discharge Diagnosis:   Principal Problem:   Acute respiratory disease due to COVID-19 virus Active Problems:   Asthma   Depression   Chronic pain   GAD (generalized anxiety disorder)   AKI (acute kidney injury) (HCC)    Discharge Condition: Stable.  Diet recommendation:  Diet Order            Diet - low sodium heart healthy           Diet Heart Room service appropriate? Yes; Fluid consistency: Thin  Diet effective now                   Code Status: Full Code     Hospital Course:    [name] is a 55 y.o. female with medical history significant forasthma, chronic pain, bipolar disorder, anxiety, insomnia, OSA, BMI 37, and gastric bypass in 2008, who presented to the hospital with cough, sore throat, fatigue, headache and malaise.  She has received 3 doses of the Pfizer vaccine series.  She was hypoxic in the emergency room with oxygen saturation of 88% on room air.  Chest x-ray showed multifocal pneumonia.  She tested positive for COVID-19 infection.  She was admitted to the hospital for acute COVID-19 pneumonia complicated by acute hypoxic respiratory failure.  She was treated with IV remdesivir, steroids and oxygen via nasal cannula.  She also had AKI which improved with IV fluids.  Her condition slowly improved and she was deemed stable for discharge to home today.  She was successfully weaned off of oxygen.  She did not want to be discharged on prednisone because she was much better and also because it makes her gain weight.        Discharge Exam:    Vitals:   10/27/20 0915 10/27/20 1317 10/27/20 2037 10/28/20 0404  BP: 124/78 127/65 (!) 144/79 121/69  Pulse: 81 97 97  76  Resp: Temp: 98.4 F (36.9 C) 98.6 F (37 C) 99.5 F (37.5 C) 97.8 F (36.6 C)  TempSrc: Oral Oral Oral Oral  SpO2: 93% 95% 96% 97%  Weight:      Height:         GEN: NAD SKIN: Warm and dry.  Mildly tender purple-colored, plaque-like lesion on the left medial thigh (she said she has had a few of these within the past month) EYES: No pallor or icterus ENT: MMM CV: RRR PULM: CTA B ABD: soft, obese, NT, +BS CNS: AAO x 3, non focal EXT: No edema or tenderness   The results of significant diagnostics from this hospitalization (including imaging, microbiology, ancillary and laboratory) are listed below for reference.     Procedures and Diagnostic Studies:   DG Chest Portable 1 View  Result Date: 10/25/2020 CLINICAL DATA:  Shortness of breath. Cough and intermittent chest pain, body aches, vomiting. EXAM: PORTABLE CHEST 1 VIEW COMPARISON:  Chest x-ray dated 03/19/2019. FINDINGS: New streaky opacities within the RIGHT upper lobe and at the bilateral lung bases. Heart size and mediastinal contours are stable. Osseous structures about the chest are unremarkable. IMPRESSION: New streaky opacities within the RIGHT upper lobe and at the bilateral lung bases, suspicious for multifocal pneumonia. Electronically Signed   By:  Bary Richard M.D.   On: 10/25/2020 15:52     Labs:   Basic Metabolic Panel: Recent Labs  Lab 10/25/20 1554 10/26/20 0449 10/27/20 0351 10/28/20 0401  NA 136 139 143 139  K 3.9 4.3 4.3 4.0  CL 102 107 107 102  CO2 24 24 27 27   GLUCOSE 87 157* 130* 158*  BUN 28* 20 14 16   CREATININE 1.45* 0.91 0.76 0.79  CALCIUM 8.4* 8.7* 8.8* 8.4*  MG  --  2.5*  --   --   PHOS  --  3.1  --   --    GFR Estimated Creatinine Clearance: 94.7 mL/min (by C-G formula based on SCr of 0.79 mg/dL). Liver Function Tests: Recent Labs  Lab 10/26/20 0449 10/27/20 0351 10/28/20 0401  AST 22 17 26   ALT 16 14 22   ALKPHOS 66 52 44  BILITOT 0.4 0.3 0.3  PROT 8.1  7.3 6.7  ALBUMIN 3.7 3.3* 3.1*   No results for input(s): LIPASE, AMYLASE in the last 168 hours. No results for input(s): AMMONIA in the last 168 hours. Coagulation profile No results for input(s): INR, PROTIME in the last 168 hours.  CBC: Recent Labs  Lab 10/25/20 1554 10/25/20 2030 10/26/20 0449 10/27/20 0351 10/28/20 0401  WBC 8.1 6.6 4.9 8.6 9.2  NEUTROABS 5.7 4.5 4.1 7.3 8.0*  HGB 12.0 10.7* 11.5* 11.0* 10.7*  HCT 38.3 34.5* 37.1 34.9* 34.1*  MCV 90.5 91.3 89.4 89.5 91.2  PLT 215 202 216 227 220   Cardiac Enzymes: No results for input(s): CKTOTAL, CKMB, CKMBINDEX, TROPONINI in the last 168 hours. BNP: Invalid input(s): POCBNP CBG: No results for input(s): GLUCAP in the last 168 hours. D-Dimer Recent Labs    10/27/20 0351 10/28/20 0401  DDIMER 0.36 <0.27   Hgb A1c No results for input(s): HGBA1C in the last 72 hours. Lipid Profile Recent Labs    10/25/20 1940  TRIG 92   Thyroid function studies No results for input(s): TSH, T4TOTAL, T3FREE, THYROIDAB in the last 72 hours.  Invalid input(s): FREET3 Anemia work up Recent Labs    10/25/20 1940  FERRITIN 66   Microbiology Recent Results (from the past 240 hour(s))  Resp Panel by RT-PCR (Flu A&B, Covid) Nasopharyngeal Swab     Status: Abnormal   Collection Time: 10/25/20  3:29 PM   Specimen: Nasopharyngeal Swab; Nasopharyngeal(NP) swabs in vial transport medium  Result Value Ref Range Status   SARS Coronavirus 2 by RT PCR POSITIVE (A) NEGATIVE Final    Comment: RESULT CALLED TO, READ BACK BY AND VERIFIED WITH: SARA DOSTER AT 2042 ON 09/24/20 BY MAJ (NOTE) SARS-CoV-2 target nucleic acids are DETECTED.  The SARS-CoV-2 RNA is generally detectable in upper respiratory specimens during the acute phase of infection. Positive results are indicative of the presence of the identified virus, but do not rule out bacterial infection or co-infection with other pathogens not detected by the test. Clinical  correlation with patient history and other diagnostic information is necessary to determine patient infection status. The expected result is Negative.  Fact Sheet for Patients: 12/25/20  Fact Sheet for Healthcare Providers: 12/25/20  This test is not yet approved or cleared by the 2043 FDA and  has been authorized for detection and/or diagnosis of SARS-CoV-2 by FDA under an Emergency Use Authorization (EUA).  This EUA will remain in effect (meaning this test c an be used) for the duration of  the COVID-19 declaration under Section 564(b)(1) of the Act, 21 U.S.C. section  360bbb-3(b)(1), unless the authorization is terminated or revoked sooner.     Influenza A by PCR NEGATIVE NEGATIVE Final   Influenza B by PCR NEGATIVE NEGATIVE Final    Comment: (NOTE) The Xpert Xpress SARS-CoV-2/FLU/RSV plus assay is intended as an aid in the diagnosis of influenza from Nasopharyngeal swab specimens and should not be used as a sole basis for treatment. Nasal washings and aspirates are unacceptable for Xpert Xpress SARS-CoV-2/FLU/RSV testing.  Fact Sheet for Patients: BloggerCourse.com  Fact Sheet for Healthcare Providers: SeriousBroker.it  This test is not yet approved or cleared by the [place] FDA and has been authorized for detection and/or diagnosis of SARS-CoV-2 by FDA under an Emergency Use Authorization (EUA). This EUA will remain in effect (meaning this test can be used) for the duration of the COVID-19 declaration under Section 564(b)(1) of the Act, 21 U.S.C. section 360bbb-3(b)(1), unless the authorization is terminated or revoked.  Performed at [facility], [address], [place], [place] [address]   Blood Culture (routine x 2)     Status: None (Preliminary result)   Collection Time: 10/25/20  7:40 PM   Specimen: BLOOD  Result  Value Ref Range Status   Specimen Description   Final    BLOOD Blood Culture adequate volume Performed at [facility], [address] [place], [place] [address]    Special Requests   Final    BOTTLES DRAWN AEROBIC AND ANAEROBIC SITE NOT SPECIFIED Performed at [facility], [address], [place], [place] [address]    Culture   Final    NO GROWTH 2 DAYS Performed at [facility], [address] [place], [place] [address]    Report Status PENDING  Incomplete     Discharge Instructions:   Discharge Instructions    Diet - low sodium heart healthy   Complete by: As directed    Increase activity slowly   Complete by: As directed      Allergies as of 10/28/2020      Reactions   Bee Venom Anaphylaxis   Other reaction(s): PRURITUS, RASH   Coconut Fatty Acids Anaphylaxis   Patient allergic to coconut in general   Mushroom Ext Cmplx(shiitake-reishi-mait) Anaphylaxis   Nutritional Supplements Anaphylaxis, Itching   walnuts   Other Anaphylaxis, Hives, Itching   Ragweed   Shellfish Allergy Anaphylaxis   Strawberry Extract Anaphylaxis   Hydrocodone-acetaminophen Itching   Latex       Medication List    STOP taking these medications   Oxycodone HCl 10 MG Tabs     TAKE these medications   albuterol 108 (90 Base) MCG/ACT inhaler Commonly known as: VENTOLIN HFA Inhale 2 puffs into the lungs every 6 (six) hours as needed for wheezing.   aspirin EC 81 MG tablet Take 81 mg by mouth daily. Swallow whole.   budesonide-formoterol 80-4.5 MCG/ACT inhaler Commonly known as: SYMBICORT Inhale 2 puffs into the lungs 2 (two) times daily as needed (wheezing/sob).   buPROPion 300 MG 24 hr tablet Commonly known as: WELLBUTRIN XL Take 1 tablet (300 mg total) by mouth daily.   Cetirizine HCl 10 MG Caps Take 1 capsule by mouth daily as needed.   clonazePAM 0.5 MG tablet Commonly known as: KLONOPIN Take 1 tablet (0.5 mg total) by mouth 3  (three) times daily as needed.   diclofenac sodium 1 % Gel Commonly known as: VOLTAREN Apply 2 g topically 4 (four) times daily.   divalproex 500 MG 24 hr  tablet Commonly known as: DEPAKOTE ER Take 1 tablet (500 mg total) by mouth in the morning and at bedtime.   doxepin 100 MG capsule Commonly known as: SINEQUAN Take 1 capsule (100 mg total) by mouth at bedtime.   Emgality 120 MG/ML Soaj Generic drug: Galcanezumab-gnlm Inject 120 mg into the skin every 30 (thirty) days.   EPINEPHrine 0.3 mg/0.3 mL Soaj injection Commonly known as: EPI-PEN Inject 0.3 mg into the muscle as needed for anaphylaxis.   estradiol 0.5 MG tablet Commonly known as: ESTRACE Take 0.5 mg by mouth daily.   fluticasone 50 MCG/ACT nasal spray Commonly known as: FLONASE PLACE 1 SPRAY INTO BOTH NOSTRILS AS NEEDED (ALLERGIES/RUNNY NOSE).   hydrochlorothiazide 12.5 MG capsule Commonly known as: MICROZIDE TAKE 1 CAPSULE (12.5 MG TOTAL) BY MOUTH DAILY AS NEEDED (EDEMA).   linaclotide 145 MCG Caps capsule Commonly known as: Linzess Take 1 capsule (145 mcg total) by mouth daily before breakfast.   meloxicam 15 MG tablet Commonly known as: MOBIC Take 15 mg by mouth daily.   montelukast 10 MG tablet Commonly known as: SINGULAIR Take 1 tablet (10 mg total) by mouth at bedtime.   Movantik 25 MG Tabs tablet Generic drug: naloxegol oxalate Take 25 mg by mouth daily.   multivitamin tablet Take 1 tablet by mouth daily.   phentermine 15 MG capsule Take 1 capsule (15 mg total) by mouth every morning.   prazosin 2 MG capsule Commonly known as: MINIPRESS Take 1 capsule (2 mg total) by mouth at bedtime.   tapentadol HCl 75 MG tablet Commonly known as: NUCYNTA Take 75 mg by mouth every 8 (eight) hours as needed.   Trulicity 3 MG/0.5ML Sopn Generic drug: Dulaglutide Inject 3 mg as directed once a week. What changed: additional instructions   VITAMIN B-1 PO Take 1 tablet by mouth daily.   Vitamin D  (Ergocalciferol) 1.25 MG (50000 UNIT) Caps capsule Commonly known as: DRISDOL Take 1 capsule (50,000 Units total) by mouth every 7 (seven) days. What changed: additional instructions         Time coordinating discharge: 27 minutes  Signed:  Boykin Baetz  [facility] 10/28/2020, 9:46 AM   Pager on [email or web address]. If 7PM-7AM, please contact night-coverage at [email or web address]

## 2020-10-28 NOTE — Progress Notes (Signed)
Pt discharged to home, instructions reviewed with patient, pt acknowledged understanding of instructions. SRP, RN

## 2020-10-29 ENCOUNTER — Other Ambulatory Visit (HOSPITAL_COMMUNITY): Payer: Self-pay | Admitting: Psychiatry

## 2020-10-29 DIAGNOSIS — F3181 Bipolar II disorder: Secondary | ICD-10-CM

## 2020-10-29 DIAGNOSIS — G4700 Insomnia, unspecified: Secondary | ICD-10-CM

## 2020-10-31 ENCOUNTER — Other Ambulatory Visit (INDEPENDENT_AMBULATORY_CARE_PROVIDER_SITE_OTHER): Payer: Self-pay | Admitting: Family Medicine

## 2020-10-31 DIAGNOSIS — E8881 Metabolic syndrome: Secondary | ICD-10-CM

## 2020-10-31 LAB — CULTURE, BLOOD (ROUTINE X 2)
Culture: NO GROWTH
Specimen Description: ADEQUATE

## 2020-11-02 ENCOUNTER — Other Ambulatory Visit: Payer: Self-pay | Admitting: Neurology

## 2020-11-03 NOTE — Telephone Encounter (Signed)
Last seen Dawn 

## 2020-11-06 ENCOUNTER — Ambulatory Visit (INDEPENDENT_AMBULATORY_CARE_PROVIDER_SITE_OTHER): Payer: Medicare Other | Admitting: Adult Health

## 2020-11-14 ENCOUNTER — Other Ambulatory Visit (INDEPENDENT_AMBULATORY_CARE_PROVIDER_SITE_OTHER): Payer: Self-pay | Admitting: Family Medicine

## 2020-11-14 DIAGNOSIS — J309 Allergic rhinitis, unspecified: Secondary | ICD-10-CM

## 2020-11-14 DIAGNOSIS — R600 Localized edema: Secondary | ICD-10-CM

## 2020-11-17 ENCOUNTER — Encounter (INDEPENDENT_AMBULATORY_CARE_PROVIDER_SITE_OTHER): Payer: Self-pay

## 2020-11-17 NOTE — Telephone Encounter (Signed)
Message sent to pt-CAS 

## 2020-11-17 NOTE — Telephone Encounter (Signed)
Pt last seen by Dawn Whitmire, FNP.  

## 2020-11-20 ENCOUNTER — Telehealth (INDEPENDENT_AMBULATORY_CARE_PROVIDER_SITE_OTHER): Payer: Medicare Other | Admitting: Psychiatry

## 2020-11-20 ENCOUNTER — Encounter (INDEPENDENT_AMBULATORY_CARE_PROVIDER_SITE_OTHER): Payer: Self-pay

## 2020-11-20 ENCOUNTER — Other Ambulatory Visit (INDEPENDENT_AMBULATORY_CARE_PROVIDER_SITE_OTHER): Payer: Self-pay | Admitting: Family Medicine

## 2020-11-20 ENCOUNTER — Other Ambulatory Visit: Payer: Self-pay

## 2020-11-20 DIAGNOSIS — F411 Generalized anxiety disorder: Secondary | ICD-10-CM

## 2020-11-20 DIAGNOSIS — G4700 Insomnia, unspecified: Secondary | ICD-10-CM | POA: Diagnosis not present

## 2020-11-20 DIAGNOSIS — F4001 Agoraphobia with panic disorder: Secondary | ICD-10-CM

## 2020-11-20 DIAGNOSIS — F3181 Bipolar II disorder: Secondary | ICD-10-CM

## 2020-11-20 DIAGNOSIS — E8881 Metabolic syndrome: Secondary | ICD-10-CM

## 2020-11-20 MED ORDER — BUPROPION HCL ER (XL) 300 MG PO TB24: 300.0000 mg | ORAL_TABLET | Freq: Every day | ORAL | 0 refills | Status: DC

## 2020-11-20 MED ORDER — DIVALPROEX SODIUM ER 500 MG PO TB24: 500.0000 mg | ORAL_TABLET | Freq: Two times a day (BID) | ORAL | 0 refills | Status: DC

## 2020-11-20 MED ORDER — DOXEPIN HCL 100 MG PO CAPS: 100.0000 mg | ORAL_CAPSULE | Freq: Every day | ORAL | 0 refills | Status: DC

## 2020-11-20 MED ORDER — PRAZOSIN HCL 2 MG PO CAPS: 2.0000 mg | ORAL_CAPSULE | Freq: Every day | ORAL | 0 refills | Status: DC

## 2020-11-20 MED ORDER — CLONAZEPAM 0.5 MG PO TABS: 0.5000 mg | ORAL_TABLET | Freq: Three times a day (TID) | ORAL | 1 refills | Status: DC | PRN

## 2020-11-20 NOTE — Progress Notes (Signed)
Virtual Visit via Telephone Note  I connected with [name] on 11/20/20 at 11:00 AM EDT by telephone and verified that I am speaking with the correct person using two identifiers.  Location: Patient: home Provider: office   I discussed the limitations, risks, security and privacy concerns of performing an evaluation and management service by telephone and the availability of in person appointments. I also discussed with the patient that there may be a patient responsible charge related to this service. The patient expressed understanding and agreed to proceed.   History of Present Illness: [name] is recovering from COVID. She was in the hospital and got home on June 9th.. She is sleeping some. She woke up with a migraine last night and hasn't gotten back to sleep since then. Most nights she is sleeping at 10pm and sleeps until 8am. She has occasional nights with racing thoughts that keep her up for a little while or other nights with some nightmares. They are not as bad as before. [name] is dealing with stress related to her son. Her depression is the same. Over the last 2 weeks she has been depressed about 4 days. Since she is sick she has low motivation and energy to go out. She has had some panic attacks when she thinks about going out. [name] has about 4 panic attacks a week due to stress. [name] takes at least 2 Klonopin at a time to calm herself. She never takes more than 3 tabs a day since getting out the hospital. Her irritability is ongoing. Her anxiety is up due to stress. [name] is upset with herself because she needs to weight. She denies thoughts of death. She denies SI/HI. She denies any manic or hypomanic like symptoms in the last 3 weeks ago.    Observations/Objective:  General Appearance: unable to assess  Eye Contact:  unable to assess  Speech:  Clear and Coherent and Normal Rate  Volume:  Decreased  Mood:  Anxious and Depressed  Affect:  Congruent  Thought  Process:  Goal Directed, Linear, and Descriptions of Associations: Intact  Orientation:  Full (Time, Place, and Person)  Thought Content:  Logical  Suicidal Thoughts:  No  Homicidal Thoughts:  No  Memory:  Immediate;   Good  Judgement:  Fair  Insight:  Good  Psychomotor Activity: unable to assess  Concentration:  Concentration: Good  Recall:  Good  Fund of Knowledge:  Good  Language:  Good  Akathisia:  unable to assess  Handed:  Right  AIMS (if indicated):     Assets:  Communication Skills Desire for Improvement Financial Resources/Insurance Housing Resilience Social Support Talents/Skills Transportation Vocational/Educational  ADL's:  unable to assess  Cognition:  WNL  Sleep:        Assessment and Plan: Depression screen [facility] 2/9 11/20/2020 09/18/2020 03/27/2019  Decreased Interest 1 3 3   Down, Depressed, Hopeless 1 3 3   PHQ - 2 Score 2 6 6   Altered sleeping 1 3 3   Tired, decreased energy 3 3 3   Change in appetite 2 3 3   Feeling bad or failure about yourself  1 3 3   Trouble concentrating 1 3 3   Moving slowly or fidgety/restless 0 - 3  Suicidal thoughts 0 0 2  PHQ-9 Score 10 21 26   Difficult doing work/chores Very difficult Extremely dIfficult Extremely dIfficult  Some recent data might be hidden    Flowsheet Row Video Visit from 11/20/2020 in [facility] Most recent reading at 11/20/2020 11:08 AM  ED to Hosp-Admission (Discharged) from 10/25/2020 in [place] [facility] Most recent reading at 10/25/2020  3:32 PM ED from 10/25/2020 in [facility]  Most recent reading at 10/25/2020  2:24 PM  C-SSRS RISK CATEGORY No Risk No Risk No Risk      - she saw her PCP last week and is monitoring her O2 levels  - I reviewed her labs done in June 2022. She is working with PCP.  - we discussed sedative medications and risks of falls, decreased O2 levels, memory problems and slowed  thinking  1. Bipolar II disorder (HCC) - buPROPion (WELLBUTRIN XL) 300 MG 24 hr tablet; Take 1 tablet (300 mg total) by mouth daily.  Dispense: 90 tablet; Refill: 0 - divalproex (DEPAKOTE ER) 500 MG 24 hr tablet; Take 1 tablet (500 mg total) by mouth in the morning and at bedtime.  Dispense: 180 tablet; Refill: 0 - doxepin (SINEQUAN) 100 MG capsule; Take 1 capsule (100 mg total) by mouth at bedtime.  Dispense: 90 capsule; Refill: 0  2. Panic disorder with agoraphobia - clonazePAM (KLONOPIN) 0.5 MG tablet; Take 1 tablet (0.5 mg total) by mouth 3 (three) times daily as needed.  Dispense: 90 tablet; Refill: 1  3. GAD (generalized anxiety disorder) - clonazePAM (KLONOPIN) 0.5 MG tablet; Take 1 tablet (0.5 mg total) by mouth 3 (three) times daily as needed.  Dispense: 90 tablet; Refill: 1 - doxepin (SINEQUAN) 100 MG capsule; Take 1 capsule (100 mg total) by mouth at bedtime.  Dispense: 90 capsule; Refill: 0  4. Insomnia, unspecified type - doxepin (SINEQUAN) 100 MG capsule; Take 1 capsule (100 mg total) by mouth at bedtime.  Dispense: 90 capsule; Refill: 0 - prazosin (MINIPRESS) 2 MG capsule; Take 1 capsule (2 mg total) by mouth at bedtime.  Dispense: 90 capsule; Refill: 0    Follow Up Instructions: In 2-3 months or sooner if needed   I discussed the assessment and treatment plan with the patient. The patient was provided an opportunity to ask questions and all were answered. The patient agreed with the plan and demonstrated an understanding of the instructions.   The patient was advised to call back or seek an in-person evaluation if the symptoms worsen or if the condition fails to improve as anticipated.  I provided 18 minutes of non-face-to-face time during this encounter.   Oletta Darter, MD

## 2020-11-20 NOTE — Telephone Encounter (Signed)
Patient took last dose today and will need it the morning of her next appointment. Please advise  LAST APPOINTMENT DATE: 11/14/2020  NEXT APPOINTMENT DATE: 11/27/2020  Patient is requesting a refill of the following medications: Requested Prescriptions   Pending Prescriptions Disp Refills   TRULICITY 3 MG/0.5ML SOPN [Pharmacy Med Name: TRULICITY 3 MG/0.5 ML PEN]      Sig: Inject 3 mg as directed once a week.   Date last filled: 10/21/20  Previously prescribed by [facility]  Lab Results  Component Value Date   HGBA1C 5.1 07/09/2020   HGBA1C 5.6 12/20/2019   HGBA1C 5.5 06/27/2019   Lab Results  Component Value Date   LDLCALC 67 07/09/2020   CREATININE 0.79 10/28/2020   Lab Results  Component Value Date   VD25OH 17.9 (L) 07/09/2020   VD25OH 22.5 (L) 12/20/2019   VD25OH 20.1 (L) 06/27/2019   BP Readings from Last 3 Encounters:  10/28/20 121/69  10/25/20 106/72  10/15/20 106/73

## 2020-11-20 NOTE — Telephone Encounter (Signed)
Pt last seen by Dawn Whitmire, FNP.  

## 2020-11-20 NOTE — Telephone Encounter (Signed)
Msg sent to pt 

## 2020-11-27 ENCOUNTER — Ambulatory Visit (INDEPENDENT_AMBULATORY_CARE_PROVIDER_SITE_OTHER): Payer: Medicare Other | Admitting: Family Medicine

## 2020-12-09 ENCOUNTER — Ambulatory Visit: Payer: Medicare Other | Admitting: Neurology

## 2020-12-18 ENCOUNTER — Other Ambulatory Visit (INDEPENDENT_AMBULATORY_CARE_PROVIDER_SITE_OTHER): Payer: Self-pay | Admitting: Family Medicine

## 2020-12-18 DIAGNOSIS — K5903 Drug induced constipation: Secondary | ICD-10-CM

## 2020-12-18 DIAGNOSIS — E559 Vitamin D deficiency, unspecified: Secondary | ICD-10-CM

## 2020-12-22 NOTE — Telephone Encounter (Signed)
Pt last seen by Dawn Whitmire, FNP.  

## 2020-12-23 ENCOUNTER — Other Ambulatory Visit (INDEPENDENT_AMBULATORY_CARE_PROVIDER_SITE_OTHER): Payer: Self-pay | Admitting: Family Medicine

## 2020-12-23 DIAGNOSIS — E559 Vitamin D deficiency, unspecified: Secondary | ICD-10-CM

## 2020-12-24 NOTE — Telephone Encounter (Signed)
Pt last seen by Dawn Whitmire, FNP.  

## 2020-12-28 ENCOUNTER — Other Ambulatory Visit (INDEPENDENT_AMBULATORY_CARE_PROVIDER_SITE_OTHER): Payer: Self-pay | Admitting: Family Medicine

## 2020-12-28 DIAGNOSIS — E8881 Metabolic syndrome: Secondary | ICD-10-CM

## 2020-12-29 ENCOUNTER — Ambulatory Visit (INDEPENDENT_AMBULATORY_CARE_PROVIDER_SITE_OTHER): Payer: Medicare Other | Admitting: Psychiatry

## 2020-12-29 ENCOUNTER — Other Ambulatory Visit: Payer: Self-pay

## 2020-12-29 ENCOUNTER — Encounter (INDEPENDENT_AMBULATORY_CARE_PROVIDER_SITE_OTHER): Payer: Self-pay | Admitting: Family Medicine

## 2020-12-29 DIAGNOSIS — F3181 Bipolar II disorder: Secondary | ICD-10-CM

## 2020-12-29 NOTE — Progress Notes (Signed)
Virtual Visit via Video Note  I connected with [name] on 12/29/20 at  2:30 PM EDT by a video enabled telemedicine application and verified that I am speaking with the correct person using two identifiers.  Location: Patient: Patient Home Provider: Home Office   I discussed the limitations of evaluation and management by telemedicine and the availability of in person appointments. The patient expressed understanding and agreed to proceed.   History of Present Illness: Bipolar 2 DO and Anxiety   Treatment Plan Goals: 1) [name] would like to improve boundary setting and communication skills with significant other to more clearly define the relationship and reduce negative impacts on mental health, in 4 out of 5 occurences.  2) [name] would like to improve self esteem through learning and applying CBT skills to address cognitive distortions and stating positive affirmations 5 out of 7 days/weekly.    Observations/Objective: Counselor met with Client for individual therapy via Webex. Counselor assessed MH symptoms and progress on treatment plan goals, with patient reporting that she is disappointed with decision making skills over past month. Client and Counselor processed decisions and how they relate to her long-term goals and align with core values. Client shared updates on boundary setting and current levels of self-esteem. Client report poor follow through and outcomes. Counselor reviewed strategies. Counselor spent remainder of session updating annual CCA and revising treatment plan goals with Client. Client would like to continue in counseling and seek additional couples counseling. Client presents with moderate depression and severe anxiety. Client denies SI/HI or self-harm.   Assessment and Plan: Counselor will continue to meet with patient to address treatment plan goals. Patient will continue to follow recommendations of providers and implement skills learned in session.    Follow Up Instructions: Counselor will send information for next session via Webex.    The patient was advised to call back or seek an in-person evaluation if the symptoms worsen or if the condition fails to improve as anticipated.   I provided 60 minutes of non-face-to-face time during this encounter.     Lise Auer, LCSW

## 2020-12-29 NOTE — Telephone Encounter (Signed)
Colleen 

## 2020-12-29 NOTE — Telephone Encounter (Signed)
LAST APPOINTMENT DATE: 10/15/20 NEXT APPOINTMENT DATE: 01/13/21   [id number] - [place], [place] - [address]  [address] Phone: [phone] Fax: [phone]  [id number] - [place]),  - [address]  ([place]) [place] [address] Phone: [phone] Fax: [phone]  Patient is requesting a refill of the following medications: Requested Prescriptions   Pending Prescriptions Disp Refills   TRULICITY 3 MG/0.5ML SOPN [Pharmacy Med Name: TRULICITY 3 MG/0.5 ML PEN]      Sig: INJECT 3 MG AS DIRECTED ONCE A WEEK.    Date last filled: 11/20/20 Previously prescribed by [facility]  Lab Results  Component Value Date   HGBA1C 5.1 07/09/2020   HGBA1C 5.6 12/20/2019   HGBA1C 5.5 06/27/2019   Lab Results  Component Value Date   LDLCALC 67 07/09/2020   CREATININE 0.79 10/28/2020   Lab Results  Component Value Date   VD25OH 17.9 (L) 07/09/2020   VD25OH 22.5 (L) 12/20/2019   VD25OH 20.1 (L) 06/27/2019    BP Readings from Last 3 Encounters:  10/28/20 121/69  10/25/20 106/72  10/15/20 106/73

## 2021-01-08 ENCOUNTER — Ambulatory Visit: Payer: Medicare Other | Admitting: Neurology

## 2021-01-10 ENCOUNTER — Other Ambulatory Visit (INDEPENDENT_AMBULATORY_CARE_PROVIDER_SITE_OTHER): Payer: Self-pay | Admitting: Family Medicine

## 2021-01-10 ENCOUNTER — Other Ambulatory Visit (HOSPITAL_COMMUNITY): Payer: Self-pay | Admitting: Psychiatry

## 2021-01-10 DIAGNOSIS — E559 Vitamin D deficiency, unspecified: Secondary | ICD-10-CM

## 2021-01-10 DIAGNOSIS — F3181 Bipolar II disorder: Secondary | ICD-10-CM

## 2021-01-10 DIAGNOSIS — G4700 Insomnia, unspecified: Secondary | ICD-10-CM

## 2021-01-13 ENCOUNTER — Ambulatory Visit (INDEPENDENT_AMBULATORY_CARE_PROVIDER_SITE_OTHER): Payer: Medicare Other | Admitting: Adult Health

## 2021-01-13 ENCOUNTER — Encounter (INDEPENDENT_AMBULATORY_CARE_PROVIDER_SITE_OTHER): Payer: Self-pay

## 2021-01-15 ENCOUNTER — Ambulatory Visit (INDEPENDENT_AMBULATORY_CARE_PROVIDER_SITE_OTHER): Payer: Medicare Other | Admitting: Bariatrics

## 2021-01-15 ENCOUNTER — Other Ambulatory Visit (HOSPITAL_COMMUNITY): Payer: Self-pay | Admitting: Psychiatry

## 2021-01-15 ENCOUNTER — Other Ambulatory Visit: Payer: Self-pay

## 2021-01-15 ENCOUNTER — Encounter (INDEPENDENT_AMBULATORY_CARE_PROVIDER_SITE_OTHER): Payer: Self-pay | Admitting: Bariatrics

## 2021-01-15 ENCOUNTER — Ambulatory Visit (INDEPENDENT_AMBULATORY_CARE_PROVIDER_SITE_OTHER): Payer: Medicare Other | Admitting: Neurology

## 2021-01-15 VITALS — BP 108/69 | HR 97 | Temp 98.5°F | Ht 65.0 in | Wt 232.0 lb

## 2021-01-15 DIAGNOSIS — R948 Abnormal results of function studies of other organs and systems: Secondary | ICD-10-CM | POA: Diagnosis not present

## 2021-01-15 DIAGNOSIS — E559 Vitamin D deficiency, unspecified: Secondary | ICD-10-CM

## 2021-01-15 DIAGNOSIS — F5089 Other specified eating disorder: Secondary | ICD-10-CM

## 2021-01-15 DIAGNOSIS — R600 Localized edema: Secondary | ICD-10-CM

## 2021-01-15 DIAGNOSIS — G43711 Chronic migraine without aura, intractable, with status migrainosus: Secondary | ICD-10-CM | POA: Diagnosis not present

## 2021-01-15 DIAGNOSIS — Z6841 Body Mass Index (BMI) 40.0 and over, adult: Secondary | ICD-10-CM | POA: Diagnosis not present

## 2021-01-15 DIAGNOSIS — K5909 Other constipation: Secondary | ICD-10-CM | POA: Diagnosis not present

## 2021-01-15 DIAGNOSIS — F3181 Bipolar II disorder: Secondary | ICD-10-CM

## 2021-01-15 MED ORDER — LINACLOTIDE 145 MCG PO CAPS: 145.0000 ug | ORAL_CAPSULE | Freq: Every day | ORAL | 0 refills | Status: DC

## 2021-01-15 MED ORDER — PHENTERMINE HCL 15 MG PO CAPS: 15.0000 mg | ORAL_CAPSULE | ORAL | 0 refills | Status: DC

## 2021-01-15 MED ORDER — HYDROCHLOROTHIAZIDE 12.5 MG PO CAPS: 12.5000 mg | ORAL_CAPSULE | Freq: Every day | ORAL | 0 refills | Status: DC | PRN

## 2021-01-15 MED ORDER — BUPROPION HCL ER (XL) 300 MG PO TB24: 300.0000 mg | ORAL_TABLET | Freq: Every day | ORAL | 0 refills | Status: DC

## 2021-01-15 MED ORDER — VITAMIN D (ERGOCALCIFEROL) 1.25 MG (50000 UNIT) PO CAPS: 50000.0000 [IU] | ORAL_CAPSULE | ORAL | 0 refills | Status: DC

## 2021-01-15 MED ORDER — ALPRAZOLAM 0.25 MG PO TABS: ORAL_TABLET | ORAL | 0 refills | Status: DC

## 2021-01-15 MED ORDER — EMGALITY 120 MG/ML ~~LOC~~ SOAJ: SUBCUTANEOUS | 11 refills | Status: DC

## 2021-01-15 NOTE — Progress Notes (Signed)
Botox- 200 units x 1 vial Lot: [id number] Expiration: 05/2023 NDC: 0023-3921-02  Bacteriostatic 0.9% Sodium Chloride- 4mL total Lot: [id number] Expiration: 05/24/2022 NDC: 0409-1966-02  Dx: G43.711 B/B  

## 2021-01-15 NOTE — Progress Notes (Signed)
Consent Form Botulism Toxin Injection For Chronic Migraine   01/15/2021: getting back on track. Will give some xanax for next time she is incredibly stressed when she comes in, she is lovely, here with fiance  Interval history 04/28/2020: Doing great, More than 60% improvement in frequency but also in severity. Ajovy not approve using emgality instead. she has failed imitrex and maxalt and cannot take anymore triptans, contraindicated due to side effects, gave her some samples. She feels clenching is a problem and she has pain when clenching in the temples as well. She had HTN on aimovig, stopped. She had botox in the past and we restarted on 03/2019, doing great. Loves emgality as an add on as well. No date set for her wedding yet, it has been 2.5 years. She is very anxious and took anxiety medication today and did better but she is very anxious. She wants an outdoor wedding. She did much better, she took her anxiety and pain meds. [facility]    Reviewed orally with patient, additionally signature is on file:  Botulism toxin has been approved by the Federal drug administration for treatment of chronic migraine. Botulism toxin does not cure chronic migraine and it may not be effective in some patients.  The administration of botulism toxin is accomplished by injecting a small amount of toxin into the muscles of the neck and head. Dosage must be titrated for each individual. Any benefits resulting from botulism toxin tend to wear off after 3 months with a repeat injection required if benefit is to be maintained. Injections are usually done every 3-4 months with maximum effect peak achieved by about 2 or 3 weeks. Botulism toxin is expensive and you should be sure of what costs you will incur resulting from the injection.  The side effects of botulism toxin use for chronic migraine may include:   -Transient, and usually mild, facial weakness with facial  injections  -Transient, and usually mild, head or neck weakness with head/neck injections  -Reduction or loss of forehead facial animation due to forehead muscle weakness  -Eyelid drooping  -Dry eye  -Pain at the site of injection or bruising at the site of injection  -Double vision  -Potential unknown long term risks  Contraindications: You should not have Botox if you are pregnant, nursing, allergic to albumin, have an infection, skin condition, or muscle weakness at the site of the injection, or have myasthenia gravis, Lambert-Eaton syndrome, or ALS.  It is also possible that as with any injection, there may be an allergic reaction or no effect from the medication. Reduced effectiveness after repeated injections is sometimes seen and rarely infection at the injection site may occur. All care will be taken to prevent these side effects. If therapy is given over a long time, atrophy and wasting in the muscle injected may occur. Occasionally the patient's become refractory to treatment because they develop antibodies to the toxin. In this event, therapy needs to be modified.  I have read the above information and consent to the administration of botulism toxin.    BOTOX PROCEDURE NOTE FOR MIGRAINE HEADACHE    Contraindications and precautions discussed with patient(above). Aseptic procedure was observed and patient tolerated procedure. Procedure performed by Dr. Artemio Aly  The condition has existed for more than 6 months, and pt does not have a diagnosis of ALS, Myasthenia Gravis or Lambert-Eaton Syndrome.  Risks and benefits of injections discussed and pt agrees to proceed  with the procedure.  Written consent obtained  These injections are medically necessary. Pt  receives good benefits from these injections. These injections do not cause sedations or hallucinations which the oral therapies may cause.  Description of procedure:  The patient was placed in a sitting position. The  standard protocol was used for Botox as follows, with 5 units of Botox injected at each site:   -Procerus muscle, midline injection  -Corrugator muscle, bilateral injection  -Frontalis muscle, bilateral injection, with 2 sites each side, medial injection was performed in the upper one third of the frontalis muscle, in the region vertical from the medial inferior edge of the superior orbital rim. The lateral injection was again in the upper one third of the forehead vertically above the lateral limbus of the cornea, 1.5 cm lateral to the medial injection site.  -Temporalis muscle injection, 4 sites, bilaterally. The first injection was 3 cm above the tragus of the ear, second injection site was 1.5 cm to 3 cm up from the first injection site in line with the tragus of the ear. The third injection site was 1.5-3 cm forward between the first 2 injection sites. The fourth injection site was 1.5 cm posterior to the second injection site.   -Occipitalis muscle injection, 3 sites, bilaterally. The first injection was done one half way between the occipital protuberance and the tip of the mastoid process behind the ear. The second injection site was done lateral and superior to the first, 1 fingerbreadth from the first injection. The third injection site was 1 fingerbreadth superiorly and medially from the first injection site.  -Cervical paraspinal muscle injection, 2 sites, bilateral knee first injection site was 1 cm from the midline of the cervical spine, 3 cm inferior to the lower border of the occipital protuberance. The second injection site was 1.5 cm superiorly and laterally to the first injection site.  -Trapezius muscle injection was performed at 3 sites, bilaterally. The first injection site was in the upper trapezius muscle halfway between the inflection point of the neck, and the acromion. The second injection site was one half way between the acromion and the first injection site. The third  injection was done between the first injection site and the inflection point of the neck.   Will return for repeat injection in 3 months.   200 units of Botox was used, any Botox not injected was wasted. The patient tolerated the procedure well, there were no complications of the above procedure.

## 2021-01-16 ENCOUNTER — Encounter (HOSPITAL_COMMUNITY): Payer: Self-pay | Admitting: Psychiatry

## 2021-01-19 ENCOUNTER — Encounter (INDEPENDENT_AMBULATORY_CARE_PROVIDER_SITE_OTHER): Payer: Self-pay | Admitting: Bariatrics

## 2021-01-19 NOTE — Progress Notes (Signed)
Chief Complaint:   OBESITY [name] is here to discuss her progress with her obesity treatment plan along with follow-up of her obesity related diagnoses. [name] is practicing portion control and making better food choices She is not following a specific food plan at this time.  Today's visit was #: 24 Starting weight: 250 lbs Starting date: 03/27/2019 Today's weight: 232 lbs Today's date: 01/15/2021 Total lbs lost to date: 18 lbs Total lbs lost since last in-office visit: 2 lbs  Interim History: [name] is down an additional 2 lbs. She is taking Phentermine. She states it helps with her appetite.  Subjective:   1. Abnormal metabolism [name] is currently taking Phentermine.  2. Vitamin D deficiency [name] is currently taking prescription Vitamin D.  3. Lower extremity edema [name]'s lower extremity edema is well controlled.  4. Other constipation [name] notes constipation.    5. Other disorder of eating [name] is struggling with emotional eating and using food for comfort to the extent that it is negatively impacting her health. She has been working on behavior modification techniques to help reduce her emotional eating.  She shows no sign of suicidal or homicidal ideations.   Assessment/Plan:   1. Abnormal metabolism [name] will continue Phentermine.  - phentermine 15 MG capsule; Take 1 capsule (15 mg total) by mouth every morning.  Dispense: 30 capsule; Refill: 0  2. Vitamin D deficiency Low Vitamin D level contributes to fatigue and are associated with obesity, breast, and colon cancer.We will refill prescription Vitamin D 50,000 IU every week for 1 month with no refills and [name] will follow-up for routine testing of Vitamin D, at least 2-3 times per year to avoid over-replacement.  - Vitamin D, Ergocalciferol, (DRISDOL) 1.25 MG (50000 UNIT) CAPS capsule; Take 1 capsule (50,000 Units total) by mouth every 7 (seven) days.  Dispense: 12 capsule; Refill:  0  3. Lower extremity edema We will refill Microzide 12.5 mg  for 1 month with no refills.   - hydrochlorothiazide (MICROZIDE) 12.5 MG capsule; Take 1 capsule (12.5 mg total) by mouth daily as needed (edema).  Dispense: 30 capsule; Refill: 0  4. Other constipation [name] was informed that a decrease in bowel movement frequency is normal while losing weight, but stools should not be hard or painful. Orders and follow up as documented in patient record.   Counseling We will refill Linzess 145 MCG for 1 month with no refills. Getting to Good Bowel Health: Your goal is to have one soft bowel movement each day. Drink at least 8 glasses of water each day. Eat plenty of fiber (goal is over 25 grams each day). It is best to get most of your fiber from dietary sources which includes leafy green vegetables, fresh fruit, and whole grains. You may need to add fiber with the help of OTC fiber supplements. These include Metamucil, Citrucel, and Flaxseed. If you are still having trouble, try adding Miralax or Magnesium Citrate. If all of these changes do not work, Dietitian.   - linaclotide (LINZESS) 145 MCG CAPS capsule; Take 1 capsule (145 mcg total) by mouth daily before breakfast.  Dispense: 30 capsule; Refill: 0  5. Other disorder of eating Behavior modification techniques were discussed today to help [name] deal with her emotional/non-hunger eating behaviors.  We will refill Wellbutrin XL 300 mg for 1 month with no refills. Orders and follow up as documented in patient record.    - buPROPion (WELLBUTRIN XL) 300 MG 24 hr tablet; Take  1 tablet (300 mg total) by mouth daily.  Dispense: 30 tablet; Refill: 0  6. Obesity, current BMI 38.6 [name] is currently in the action stage of change. As such, her goal is to continue with weight loss efforts. She has agreed to continue to increase her protein, while controlling her  portion size and making good choices.   We will refill  Phentermine 15 mg  daily by mouth every morning for 1 month with no refills.  Exercise goals: No exercise has been prescribed at this time.  Behavioral modification strategies: increasing lean protein intake, decreasing simple carbohydrates, increasing vegetables, increasing water intake, decreasing eating out, no skipping meals, meal planning and cooking strategies, keeping healthy foods in the home, and planning for success.  [name] has agreed to follow-up with our clinic in 3-4 weeks with [name]. She was informed of the importance of frequent follow-up visits to maximize her success with intensive lifestyle modifications for her multiple health conditions.   Objective:   Blood pressure 108/69, pulse 97, temperature 98.5 F (36.9 C), height 5\' 5"  (1.651 m), weight 232 lb (105.2 kg), SpO2 98 %. Body mass index is 38.61 kg/m.  General: Cooperative, alert, well developed, in no acute distress. HEENT: Conjunctivae and lids unremarkable. Cardiovascular: Regular rhythm.  Lungs: Normal work of breathing. Neurologic: No focal deficits.   Lab Results  Component Value Date   CREATININE 0.79 10/28/2020   BUN 16 10/28/2020   NA 139 10/28/2020   K 4.0 10/28/2020   CL 102 10/28/2020   CO2 27 10/28/2020   Lab Results  Component Value Date   ALT 22 10/28/2020   AST 26 10/28/2020   ALKPHOS 44 10/28/2020   BILITOT 0.3 10/28/2020   Lab Results  Component Value Date   HGBA1C 5.1 07/09/2020   HGBA1C 5.6 12/20/2019   HGBA1C 5.5 06/27/2019   HGBA1C 5.9 (H) 03/27/2019   Lab Results  Component Value Date   INSULIN 8.8 07/09/2020   INSULIN 8.7 12/20/2019   INSULIN 7.9 03/27/2019   Lab Results  Component Value Date   TSH 3.120 07/09/2020   Lab Results  Component Value Date   CHOL 227 (H) 07/09/2020   HDL 142 07/09/2020   LDLCALC 67 07/09/2020   TRIG 92 10/25/2020   CHOLHDL 1.6 07/09/2020   Lab Results  Component Value Date   VD25OH 17.9 (L) 07/09/2020   VD25OH 22.5 (L) 12/20/2019   VD25OH  20.1 (L) 06/27/2019   Lab Results  Component Value Date   WBC 9.2 10/28/2020   HGB 10.7 (L) 10/28/2020   HCT 34.1 (L) 10/28/2020   MCV 91.2 10/28/2020   PLT 220 10/28/2020   Lab Results  Component Value Date   IRON 106 07/09/2020   TIBC 299 07/09/2020   FERRITIN 66 10/25/2020    Obesity Behavioral Intervention:   Approximately 15 minutes were spent on the discussion below.  ASK: We discussed the diagnosis of obesity with [name] today and [name] agreed to give [name] permission to discuss obesity behavioral modification therapy today.  ASSESS: [name] has the diagnosis of obesity and her BMI today is 38.6. [name] is in the action stage of change.   ADVISE: [name] was educated on the multiple health risks of obesity as well as the benefit of weight loss to improve her health. She was advised of the need for long term treatment and the importance of lifestyle modifications to improve her current health and to decrease her risk of future health problems.  AGREE: Multiple  dietary modification options and treatment options were discussed and [name] agreed to follow the recommendations documented in the above note.  ARRANGE: [name] was educated on the importance of frequent visits to treat obesity as outlined per CMS and USPSTF guidelines and agreed to schedule her next follow up appointment today.  Attestation Statements:   Reviewed by clinician on day of visit: allergies, medications, problem list, medical history, surgical history, family history, social history, and previous encounter notes.    I, Jackson Latino, RMA, am acting as Energy manager for [employer], DO.   I have reviewed the above documentation for accuracy and completeness, and I agree with the above. Corinna Capra, DO

## 2021-01-22 ENCOUNTER — Other Ambulatory Visit: Payer: Self-pay

## 2021-01-22 ENCOUNTER — Telehealth (INDEPENDENT_AMBULATORY_CARE_PROVIDER_SITE_OTHER): Payer: Medicare Other | Admitting: Psychiatry

## 2021-01-22 DIAGNOSIS — F5089 Other specified eating disorder: Secondary | ICD-10-CM

## 2021-01-22 DIAGNOSIS — F411 Generalized anxiety disorder: Secondary | ICD-10-CM | POA: Diagnosis not present

## 2021-01-22 DIAGNOSIS — F4001 Agoraphobia with panic disorder: Secondary | ICD-10-CM

## 2021-01-22 DIAGNOSIS — F3181 Bipolar II disorder: Secondary | ICD-10-CM | POA: Diagnosis not present

## 2021-01-22 DIAGNOSIS — G4700 Insomnia, unspecified: Secondary | ICD-10-CM

## 2021-01-22 MED ORDER — PRAZOSIN HCL 2 MG PO CAPS: 2.0000 mg | ORAL_CAPSULE | Freq: Every day | ORAL | 0 refills | Status: DC

## 2021-01-22 MED ORDER — CLONAZEPAM 0.5 MG PO TABS: 0.5000 mg | ORAL_TABLET | Freq: Three times a day (TID) | ORAL | 1 refills | Status: DC | PRN

## 2021-01-22 MED ORDER — DOXEPIN HCL 100 MG PO CAPS: 100.0000 mg | ORAL_CAPSULE | Freq: Every day | ORAL | 0 refills | Status: DC

## 2021-01-22 MED ORDER — DIVALPROEX SODIUM ER 500 MG PO TB24: 500.0000 mg | ORAL_TABLET | Freq: Two times a day (BID) | ORAL | 0 refills | Status: DC

## 2021-01-22 MED ORDER — BUPROPION HCL ER (XL) 150 MG PO TB24: 450.0000 mg | ORAL_TABLET | Freq: Every day | ORAL | 0 refills | Status: DC

## 2021-01-22 NOTE — Progress Notes (Signed)
Virtual Visit via Telephone Note  I connected with [name] on 01/22/21 at  2:30 PM EDT by telephone and verified that I am speaking with the correct person using two identifiers. [name] was no able to connect by video so we opted to continue by phone.   Location: Patient: home Provider: office   I discussed the limitations, risks, security and privacy concerns of performing an evaluation and management service by telephone and the availability of in person appointments. I also discussed with the patient that there may be a patient responsible charge related to this service. The patient expressed understanding and agreed to proceed.   History of Present Illness: "I have been doing good other than the anxiety". Her libido is low and she has not been as intimate with her fiance. He asked for an open relationship and she agreed. Since then she has been having nightmares, losing hair, irritable and anxious. She takes anxiety medication and within 3 hrs she is anxious again. [name] finally told him that she can't handle an open relationship. She is now sleeping better, her appetite has improved and her anxiety has decreased a small amount. Sometimes she wakes up in the middle of the night with panic like symptoms. [name] is not sure if it is stress or nightmares. She then wakes up and does chores in the middle of the night. Her memory is poor and it takes a long while to learn new things. She does not like to go out due to anxiety about COVID and depression. She has not showered in 4 days because of the effort involved in getting ready. [name] has been thinking about death a lot more lately. She denies SI/HI and believes it is sin to commit suicide. [name] has been talking to son about handling her affairs when she passes. [name] states several people have died unexpectedly recently so she just wants to be safe. [name] and her friend will start water aerobics thsi week. Her son will starting  walking in the evenings with her. She has gained weight and it bothers her. She is thinking of calling her doctor about the lap band again because she has gained so much weight. She is irritable with people but admits she has good support. Pt denies recent manic and hypomanic symptoms including periods of decreased need for sleep, increased energy, mood lability, impulsivity, FOI, and excessive spending. [name] is concerned about bugs in her house. She often thinks she sees them crawling around but no one else sees them. She had her house sprayed 2 weeks ago and is going to have it done again.      Observations/Objective:  General Appearance: unable to assess  Eye Contact:  unable to assess  Speech:  Clear and Coherent and Normal Rate  Volume:  Normal  Mood:  Anxious and Depressed  Affect:  Congruent  Thought Process:  Coherent and Descriptions of Associations: Circumstantial  Orientation:  Full (Time, Place, and Person)  Thought Content:  Rumination  Suicidal Thoughts:  No  Homicidal Thoughts:  No  Memory:  Immediate;   Good  Judgement:  Good  Insight:  Good  Psychomotor Activity: unable to assess  Concentration:  Concentration: Fair  Recall:  Fair  Fund of Knowledge:  Good  Language:  Good  Akathisia:  unable to assess  Handed:  unable to assess  AIMS (if indicated):     Assets:  Communication Skills Desire for Improvement Financial Resources/Insurance Housing Intimacy Curator Vocational/Educational  ADL's:  unable to assess  Cognition:  WNL  Sleep:        Assessment and Plan:  [name] is working with her neurologist regarding migraines. She has botox injections done earlier this week and it helped a lot. She see's her neurologist every 3 months.  - increase Wellbutrin XL 450mg  qD for mood and anxiety. Pt is concerned about potential weight gain as side effect related to most medications.   1. Other disorder of eating - buPROPion (WELLBUTRIN  XL) 150 MG 24 hr tablet; Take 3 tablets (450 mg total) by mouth daily.  Dispense: 270 tablet; Refill: 0  2. Panic disorder with agoraphobia - clonazePAM (KLONOPIN) 0.5 MG tablet; Take 1 tablet (0.5 mg total) by mouth 3 (three) times daily as needed.  Dispense: 90 tablet; Refill: 1  3. GAD (generalized anxiety disorder) - clonazePAM (KLONOPIN) 0.5 MG tablet; Take 1 tablet (0.5 mg total) by mouth 3 (three) times daily as needed.  Dispense: 90 tablet; Refill: 1 - doxepin (SINEQUAN) 100 MG capsule; Take 1 capsule (100 mg total) by mouth at bedtime.  Dispense: 90 capsule; Refill: 0  4. Bipolar II disorder (HCC) - doxepin (SINEQUAN) 100 MG capsule; Take 1 capsule (100 mg total) by mouth at bedtime.  Dispense: 90 capsule; Refill: 0 - divalproex (DEPAKOTE ER) 500 MG 24 hr tablet; Take 1 tablet (500 mg total) by mouth in the morning and at bedtime.  Dispense: 180 tablet; Refill: 0  5. Insomnia, unspecified type - doxepin (SINEQUAN) 100 MG capsule; Take 1 capsule (100 mg total) by mouth at bedtime.  Dispense: 90 capsule; Refill: 0 - prazosin (MINIPRESS) 2 MG capsule; Take 1 capsule (2 mg total) by mouth at bedtime.  Dispense: 90 capsule; Refill: 0   Follow Up Instructions: In 2-3 months or sooner if needed   I discussed the assessment and treatment plan with the patient. The patient was provided an opportunity to ask questions and all were answered. The patient agreed with the plan and demonstrated an understanding of the instructions.   The patient was advised to call back or seek an in-person evaluation if the symptoms worsen or if the condition fails to improve as anticipated.  I provided 26 minutes of non-face-to-face time during this encounter.   , MD

## 2021-01-30 ENCOUNTER — Other Ambulatory Visit (INDEPENDENT_AMBULATORY_CARE_PROVIDER_SITE_OTHER): Payer: Self-pay | Admitting: Family Medicine

## 2021-01-30 DIAGNOSIS — E8881 Metabolic syndrome: Secondary | ICD-10-CM

## 2021-01-31 ENCOUNTER — Other Ambulatory Visit (INDEPENDENT_AMBULATORY_CARE_PROVIDER_SITE_OTHER): Payer: Self-pay | Admitting: Bariatrics

## 2021-01-31 DIAGNOSIS — K5909 Other constipation: Secondary | ICD-10-CM

## 2021-02-02 NOTE — Telephone Encounter (Signed)
Last OV with Dr Brown 

## 2021-02-02 NOTE — Telephone Encounter (Signed)
Pt last seen by Dr. Brown.  

## 2021-02-03 ENCOUNTER — Other Ambulatory Visit (INDEPENDENT_AMBULATORY_CARE_PROVIDER_SITE_OTHER): Payer: Self-pay | Admitting: Bariatrics

## 2021-02-03 DIAGNOSIS — E8881 Metabolic syndrome: Secondary | ICD-10-CM

## 2021-02-03 MED ORDER — TRULICITY 3 MG/0.5ML ~~LOC~~ SOAJ: 3.0000 mg | SUBCUTANEOUS | 0 refills | Status: DC

## 2021-02-03 NOTE — Telephone Encounter (Signed)
LAST APPOINTMENT DATE: 01/15/21 NEXT APPOINTMENT DATE: 02/24/21   [id number] - [place], [place] - [address] [place] [address] Phone: [phone] Fax: [phone]  [id number] - [place] ([place]),  - [address] [place] ([place]) [place] [address] Phone: [phone] Fax: [phone]  Patient is requesting a refill of the following medications: Pending Prescriptions:                       Disp   Refills   TRULICITY 3 MG/0.5ML SOPN [Pharmacy Med Na*               Sig: INJECT 3 MG AS DIRECTED ONCE A WEEK.   Date last filled: 12/29/20 Previously prescribed by [facility]  Lab Results      Component                Value               Date                      HGBA1C                   5.1                 07/09/2020                HGBA1C                   5.6                 12/20/2019                HGBA1C                   5.5                 06/27/2019           Lab Results      Component                Value               Date                      LDLCALC                  67                  07/09/2020                CREATININE               0.79                10/28/2020           Lab Results      Component                Value               Date                      VD25OH                   17.9 (L)            07/09/2020  VD25OH                   22.5 (L)            12/20/2019                VD25OH                   20.1 (L)            06/27/2019            BP Readings from Last 3 Encounters: 01/15/21 : 108/69 10/28/20 : 121/69 10/25/20 : 106/72

## 2021-02-06 ENCOUNTER — Other Ambulatory Visit (INDEPENDENT_AMBULATORY_CARE_PROVIDER_SITE_OTHER): Payer: Self-pay | Admitting: Bariatrics

## 2021-02-06 DIAGNOSIS — R600 Localized edema: Secondary | ICD-10-CM

## 2021-02-09 ENCOUNTER — Encounter (INDEPENDENT_AMBULATORY_CARE_PROVIDER_SITE_OTHER): Payer: Self-pay

## 2021-02-09 NOTE — Telephone Encounter (Signed)
Message sent to pt-CAS 

## 2021-02-09 NOTE — Telephone Encounter (Signed)
Last Ov with Dr Brown

## 2021-02-23 ENCOUNTER — Other Ambulatory Visit (INDEPENDENT_AMBULATORY_CARE_PROVIDER_SITE_OTHER): Payer: Self-pay | Admitting: Bariatrics

## 2021-02-23 DIAGNOSIS — K5909 Other constipation: Secondary | ICD-10-CM

## 2021-02-24 ENCOUNTER — Other Ambulatory Visit: Payer: Self-pay

## 2021-02-24 ENCOUNTER — Ambulatory Visit (INDEPENDENT_AMBULATORY_CARE_PROVIDER_SITE_OTHER): Payer: Medicare Other | Admitting: Family Medicine

## 2021-02-24 ENCOUNTER — Encounter (INDEPENDENT_AMBULATORY_CARE_PROVIDER_SITE_OTHER): Payer: Self-pay | Admitting: Family Medicine

## 2021-02-24 VITALS — BP 127/83 | HR 99 | Temp 98.1°F | Ht 65.0 in | Wt 230.0 lb

## 2021-02-24 DIAGNOSIS — G8929 Other chronic pain: Secondary | ICD-10-CM

## 2021-02-24 DIAGNOSIS — F3181 Bipolar II disorder: Secondary | ICD-10-CM | POA: Diagnosis not present

## 2021-02-24 DIAGNOSIS — E1169 Type 2 diabetes mellitus with other specified complication: Secondary | ICD-10-CM

## 2021-02-24 DIAGNOSIS — Z6841 Body Mass Index (BMI) 40.0 and over, adult: Secondary | ICD-10-CM

## 2021-02-24 DIAGNOSIS — R600 Localized edema: Secondary | ICD-10-CM

## 2021-02-24 MED ORDER — HYDROCHLOROTHIAZIDE 12.5 MG PO CAPS: 12.5000 mg | ORAL_CAPSULE | Freq: Every day | ORAL | 0 refills | Status: DC | PRN

## 2021-02-24 MED ORDER — OZEMPIC (1 MG/DOSE) 4 MG/3ML ~~LOC~~ SOPN
1.0000 mg | PEN_INJECTOR | SUBCUTANEOUS | 0 refills | Status: DC
Start: 2021-02-24 — End: 2021-04-07

## 2021-02-24 NOTE — Telephone Encounter (Signed)
Last OV with Dr Brown 

## 2021-02-25 NOTE — Progress Notes (Signed)
Chief Complaint:   OBESITY [name] is here to discuss her progress with her obesity treatment plan along with follow-up of her obesity related diagnoses.   Today's visit was #: 25 Starting weight: 250 lbs Starting date: 03/27/2019 Today's weight: 230 lbs Today's date: 02/24/2021 Weight change since last visit: 2 lbs Total lbs lost to date: 20 lbs Body mass index is 38.27 kg/m.  Total weight loss percentage to date: -8.00%  Current Meal Plan: practicing portion control and making smarter food choices, such as increasing vegetables and decreasing simple carbohydrates for 45% of the time.  Current Exercise Plan: Increased walking. Current Anti-Obesity Medications: phentermine 15 mg daily, Trulicity 3 mg subcutaneously weekly. Side effects: None.  Interim History:  [name] has been moving more.  She is still supplementing with protein drinks.  Assessment/Plan:   1. Type 2 diabetes mellitus with other specified complication, without long-term current use of insulin (HCC) Diabetes Mellitus: A1c at goal, but weight loss attempt may benefit from change to another GLP1RA. Medication: Trulicity 3 mg subcutaneously weekly.   Plan:  Stop Trulicity and start Ozempic 1 mg subcutaneously weekly, as per below. The patient will continue to focus on protein-rich, low simple carbohydrate foods. We reviewed the importance of hydration, regular exercise for stress reduction, and restorative sleep.   Lab Results  Component Value Date   HGBA1C 5.1 07/09/2020   HGBA1C 5.6 12/20/2019   HGBA1C 5.5 06/27/2019   Lab Results  Component Value Date   LDLCALC 67 07/09/2020   CREATININE 0.79 10/28/2020   - Semaglutide, 1 MG/DOSE, (OZEMPIC, 1 MG/DOSE,) 4 MG/3ML SOPN; Inject 1 mg into the skin once a week.  Dispense: 3 mL; Refill: 0  2. Other chronic pain Stable.  We will continue to monitor symptoms as they relate to her weight loss journey.  3. Lower extremity edema Alba's lower extremity edema  is well controlled with HCTZ 12.5 mg daily as needed.  - Refill hydrochlorothiazide (MICROZIDE) 12.5 MG capsule; Take 1 capsule (12.5 mg total) by mouth daily as needed (edema).  Dispense: 30 capsule; Refill: 0  4. Bipolar 2 disorder (HCC) Leaning toward depression.  Followed by Dr. Michae Kava for this.  We will continue to monitor symptoms as they relate to her weight loss journey.  5. Obesity, current BMI 38.4  Course: [name] is currently in the action stage of change. As such, her goal is to continue with weight loss efforts.   Nutrition goals: She has agreed to practicing portion control and making smarter food choices, such as increasing vegetables and decreasing simple carbohydrates.   Exercise goals:  Increase NEAT.  Behavioral modification strategies: increasing lean protein intake, decreasing simple carbohydrates, increasing vegetables, and increasing water intake.  Akaya has agreed to follow-up with our clinic in 4 weeks. She was informed of the importance of frequent follow-up visits to maximize her success with intensive lifestyle modifications for her multiple health conditions.   Objective:   Blood pressure 127/83, pulse 99, temperature 98.1 F (36.7 C), temperature source Oral, height 5\' 5"  (1.651 m), weight 230 lb (104.3 kg), SpO2 97 %. Body mass index is 38.27 kg/m.  General: Cooperative, alert, well developed, in no acute distress. HEENT: Conjunctivae and lids unremarkable. Cardiovascular: Regular rhythm.  Lungs: Normal work of breathing. Neurologic: No focal deficits.   Lab Results  Component Value Date   CREATININE 0.79 10/28/2020   BUN 16 10/28/2020   NA 139 10/28/2020   K 4.0 10/28/2020   CL 102 10/28/2020  CO2 27 10/28/2020   Lab Results  Component Value Date   ALT 22 10/28/2020   AST 26 10/28/2020   ALKPHOS 44 10/28/2020   BILITOT 0.3 10/28/2020   Lab Results  Component Value Date   HGBA1C 5.1 07/09/2020   HGBA1C 5.6 12/20/2019   HGBA1C 5.5  06/27/2019   HGBA1C 5.9 (H) 03/27/2019   Lab Results  Component Value Date   INSULIN 8.8 07/09/2020   INSULIN 8.7 12/20/2019   INSULIN 7.9 03/27/2019   Lab Results  Component Value Date   TSH 3.120 07/09/2020   Lab Results  Component Value Date   CHOL 227 (H) 07/09/2020   HDL 142 07/09/2020   LDLCALC 67 07/09/2020   TRIG 92 10/25/2020   CHOLHDL 1.6 07/09/2020   Lab Results  Component Value Date   VD25OH 17.9 (L) 07/09/2020   VD25OH 22.5 (L) 12/20/2019   VD25OH 20.1 (L) 06/27/2019   Lab Results  Component Value Date   WBC 9.2 10/28/2020   HGB 10.7 (L) 10/28/2020   HCT 34.1 (L) 10/28/2020   MCV 91.2 10/28/2020   PLT 220 10/28/2020   Lab Results  Component Value Date   IRON 106 07/09/2020   TIBC 299 07/09/2020   FERRITIN 66 10/25/2020   Attestation Statements:   Reviewed by clinician on day of visit: allergies, medications, problem list, medical history, surgical history, family history, social history, and previous encounter notes.  I, Insurance claims handler, CMA, am acting as transcriptionist for Helane Rima, DO  I have reviewed the above documentation for accuracy and completeness, and I agree with the above. -  Helane Rima, DO, MS, FAAFP, DABOM - Family and Bariatric Medicine.

## 2021-03-12 ENCOUNTER — Other Ambulatory Visit: Payer: Self-pay | Admitting: Neurology

## 2021-03-15 ENCOUNTER — Other Ambulatory Visit (INDEPENDENT_AMBULATORY_CARE_PROVIDER_SITE_OTHER): Payer: Self-pay | Admitting: Bariatrics

## 2021-03-15 DIAGNOSIS — R948 Abnormal results of function studies of other organs and systems: Secondary | ICD-10-CM

## 2021-03-15 DIAGNOSIS — K5909 Other constipation: Secondary | ICD-10-CM

## 2021-03-16 ENCOUNTER — Other Ambulatory Visit (INDEPENDENT_AMBULATORY_CARE_PROVIDER_SITE_OTHER): Payer: Self-pay | Admitting: Bariatrics

## 2021-03-16 DIAGNOSIS — R948 Abnormal results of function studies of other organs and systems: Secondary | ICD-10-CM

## 2021-03-17 ENCOUNTER — Ambulatory Visit: Payer: Medicare Other | Admitting: Neurology

## 2021-03-17 NOTE — Telephone Encounter (Signed)
Last OV with Dr Wallace 

## 2021-03-20 ENCOUNTER — Other Ambulatory Visit (INDEPENDENT_AMBULATORY_CARE_PROVIDER_SITE_OTHER): Payer: Self-pay | Admitting: Family Medicine

## 2021-03-20 DIAGNOSIS — R600 Localized edema: Secondary | ICD-10-CM

## 2021-03-23 NOTE — Telephone Encounter (Signed)
Last seen Dr. Wallace

## 2021-03-24 ENCOUNTER — Ambulatory Visit (INDEPENDENT_AMBULATORY_CARE_PROVIDER_SITE_OTHER): Payer: Medicare Other | Admitting: Family Medicine

## 2021-03-26 ENCOUNTER — Other Ambulatory Visit: Payer: Self-pay

## 2021-03-26 ENCOUNTER — Telehealth (HOSPITAL_COMMUNITY): Payer: Medicare Other | Admitting: Psychiatry

## 2021-04-06 ENCOUNTER — Ambulatory Visit (INDEPENDENT_AMBULATORY_CARE_PROVIDER_SITE_OTHER): Payer: Medicare Other | Admitting: Bariatrics

## 2021-04-07 ENCOUNTER — Other Ambulatory Visit: Payer: Self-pay

## 2021-04-07 ENCOUNTER — Ambulatory Visit (INDEPENDENT_AMBULATORY_CARE_PROVIDER_SITE_OTHER): Payer: Medicare Other | Admitting: Bariatrics

## 2021-04-07 ENCOUNTER — Encounter (INDEPENDENT_AMBULATORY_CARE_PROVIDER_SITE_OTHER): Payer: Self-pay | Admitting: Bariatrics

## 2021-04-07 VITALS — BP 113/81 | HR 102 | Temp 98.5°F | Ht 65.0 in | Wt 229.0 lb

## 2021-04-07 DIAGNOSIS — E559 Vitamin D deficiency, unspecified: Secondary | ICD-10-CM

## 2021-04-07 DIAGNOSIS — R948 Abnormal results of function studies of other organs and systems: Secondary | ICD-10-CM

## 2021-04-07 DIAGNOSIS — E1169 Type 2 diabetes mellitus with other specified complication: Secondary | ICD-10-CM | POA: Diagnosis not present

## 2021-04-07 DIAGNOSIS — K5909 Other constipation: Secondary | ICD-10-CM

## 2021-04-07 DIAGNOSIS — Z6841 Body Mass Index (BMI) 40.0 and over, adult: Secondary | ICD-10-CM

## 2021-04-07 MED ORDER — VITAMIN D (ERGOCALCIFEROL) 1.25 MG (50000 UNIT) PO CAPS: 50000.0000 [IU] | ORAL_CAPSULE | ORAL | 0 refills | Status: DC

## 2021-04-07 MED ORDER — OZEMPIC (1 MG/DOSE) 4 MG/3ML ~~LOC~~ SOPN: 1.0000 mg | PEN_INJECTOR | SUBCUTANEOUS | 0 refills | Status: DC

## 2021-04-07 MED ORDER — LINACLOTIDE 145 MCG PO CAPS: 145.0000 ug | ORAL_CAPSULE | Freq: Every day | ORAL | 0 refills | Status: DC

## 2021-04-07 MED ORDER — PHENTERMINE HCL 15 MG PO CAPS: 15.0000 mg | ORAL_CAPSULE | ORAL | 0 refills | Status: DC

## 2021-04-08 ENCOUNTER — Ambulatory Visit (INDEPENDENT_AMBULATORY_CARE_PROVIDER_SITE_OTHER): Payer: Medicare Other | Admitting: Bariatrics

## 2021-04-08 ENCOUNTER — Encounter (INDEPENDENT_AMBULATORY_CARE_PROVIDER_SITE_OTHER): Payer: Self-pay | Admitting: Bariatrics

## 2021-04-08 NOTE — Progress Notes (Signed)
Chief Complaint:   OBESITY [name] is here to discuss her progress with her obesity treatment plan along with follow-up of her obesity related diagnoses. [name] is on [facility] and states she is following her eating plan approximately 0% of the time. [name] states she is not exercising at this time.   Today's visit was #: 26 Starting weight: 250 lbs Starting date: 03/27/2019 Today's weight: 229 lbs Today's date: 04/07/2021 Total lbs lost to date: 21 lbs Total lbs lost since last in-office visit: 1 lb  Interim History: [name] is down 1 additional lb since her last visit, but is doing well overall. She is getting in her protein and water.   Subjective:   1. Vitamin D deficiency [name] is taking her medications as directed. She is getting minimal sun exposure.  2. Abnormal metabolism [name] is taking her medications as directed. She denies side effects.   3. Type 2 diabetes mellitus with other specified complication, without long-term current use of insulin (HCC) [name] is taking her medications as directed. She notes occasional constipation.  4. Other constipation [name] states Linzess helps with the constipation.  Assessment/Plan:   1. Vitamin D deficiency Low Vitamin D level contributes to fatigue and are associated with obesity, breast, and colon cancer. We will refill prescription Vitamin D 50,000 IU every week for 3 months with no refill and [name] will follow-up for routine testing of Vitamin D, at least 2-3 times per year to avoid over-replacement.  - Vitamin D, Ergocalciferol, (DRISDOL) 1.25 MG (50000 UNIT) CAPS capsule; Take 1 capsule (50,000 Units total) by mouth every 7 (seven) days.  Dispense: 12 capsule; Refill: 0  2. Abnormal metabolism We will refill Phentermine 15 mg for 1 month with no refills.   - phentermine 15 MG capsule; Take 1 capsule (15 mg total) by mouth every morning.  Dispense: 30 capsule; Refill: 0  3. Type 2 diabetes mellitus with other  specified complication, without long-term current use of insulin (HCC) We will refill Ozempic 1 mg for 1 month with no refills. Good blood sugar control is important to decrease the likelihood of diabetic complications such as nephropathy, neuropathy, limb loss, blindness, coronary artery disease, and death. Intensive lifestyle modification including diet, exercise and weight loss are the first line of treatment for diabetes.   - Semaglutide, 1 MG/DOSE, (OZEMPIC, 1 MG/DOSE,) 4 MG/3ML SOPN; Inject 1 mg into the skin once a week.  Dispense: 3 mL; Refill: 0  4. Other constipation [name] was informed that a decrease in bowel movement frequency is normal while losing weight, but stools should not be hard or painful. We will refill Linzess 145 mcg for 1 month with no refills. Orders and follow up as documented in patient record.   Counseling Getting to Good Bowel Health: Your goal is to have one soft bowel movement each day. Drink at least 8 glasses of water each day. Eat plenty of fiber (goal is over 25 grams each day). It is best to get most of your fiber from dietary sources which includes leafy green vegetables, fresh fruit, and whole grains. You may need to add fiber with the help of OTC fiber supplements. These include Metamucil, Citrucel, and Flaxseed. If you are still having trouble, try adding Miralax or Magnesium Citrate. If all of these changes do not work, Dietitian.  - linaclotide (LINZESS) 145 MCG CAPS capsule; Take 1 capsule (145 mcg total) by mouth daily before breakfast.  Dispense: 30 capsule; Refill: 0  5. Obesity,  current BMI 38.2 [name] is currently in the action stage of change. As such, her goal is to continue with weight loss efforts. She has agreed to [employer] will continue meal planning and she will continue intentional eating. She will adhere to the plan 80-90%.  Exercise goals: No exercise has been prescribed at this time.  Behavioral modification  strategies: increasing lean protein intake, decreasing simple carbohydrates, increasing vegetables, increasing water intake, decreasing eating out, no skipping meals, meal planning and cooking strategies, keeping healthy foods in the home, and planning for success.  [name] has agreed to follow-up with our clinic in 3 weeks. She was informed of the importance of frequent follow-up visits to maximize her success with intensive lifestyle modifications for her multiple health conditions.   Objective:   Blood pressure 113/81, pulse (!) 102, temperature 98.5 F (36.9 C), height 5\' 5"  (1.651 m), weight 229 lb (103.9 kg), SpO2 97 %. Body mass index is 38.11 kg/m.  General: Cooperative, alert, well developed, in no acute distress. HEENT: Conjunctivae and lids unremarkable. Cardiovascular: Regular rhythm.  Lungs: Normal work of breathing. Neurologic: No focal deficits.   Lab Results  Component Value Date   CREATININE 0.79 10/28/2020   BUN 16 10/28/2020   NA 139 10/28/2020   K 4.0 10/28/2020   CL 102 10/28/2020   CO2 27 10/28/2020   Lab Results  Component Value Date   ALT 22 10/28/2020   AST 26 10/28/2020   ALKPHOS 44 10/28/2020   BILITOT 0.3 10/28/2020   Lab Results  Component Value Date   HGBA1C 5.1 07/09/2020   HGBA1C 5.6 12/20/2019   HGBA1C 5.5 06/27/2019   HGBA1C 5.9 (H) 03/27/2019   Lab Results  Component Value Date   INSULIN 8.8 07/09/2020   INSULIN 8.7 12/20/2019   INSULIN 7.9 03/27/2019   Lab Results  Component Value Date   TSH 3.120 07/09/2020   Lab Results  Component Value Date   CHOL 227 (H) 07/09/2020   HDL 142 07/09/2020   LDLCALC 67 07/09/2020   TRIG 92 10/25/2020   CHOLHDL 1.6 07/09/2020   Lab Results  Component Value Date   VD25OH 17.9 (L) 07/09/2020   VD25OH 22.5 (L) 12/20/2019   VD25OH 20.1 (L) 06/27/2019   Lab Results  Component Value Date   WBC 9.2 10/28/2020   HGB 10.7 (L) 10/28/2020   HCT 34.1 (L) 10/28/2020   MCV 91.2 10/28/2020    PLT 220 10/28/2020   Lab Results  Component Value Date   IRON 106 07/09/2020   TIBC 299 07/09/2020   FERRITIN 66 10/25/2020   Attestation Statements:   Reviewed by clinician on day of visit: allergies, medications, problem list, medical history, surgical history, family history, social history, and previous encounter notes.  I, Lizbeth Bark, RMA, am acting as Location manager for [employer], DO.   I have reviewed the above documentation for accuracy and completeness, and I agree with the above. Jearld Lesch, DO

## 2021-04-09 ENCOUNTER — Other Ambulatory Visit: Payer: Self-pay

## 2021-04-09 ENCOUNTER — Telehealth (HOSPITAL_BASED_OUTPATIENT_CLINIC_OR_DEPARTMENT_OTHER): Payer: Medicare Other | Admitting: Psychiatry

## 2021-04-09 DIAGNOSIS — F4001 Agoraphobia with panic disorder: Secondary | ICD-10-CM | POA: Diagnosis not present

## 2021-04-09 DIAGNOSIS — F5089 Other specified eating disorder: Secondary | ICD-10-CM

## 2021-04-09 DIAGNOSIS — F3181 Bipolar II disorder: Secondary | ICD-10-CM | POA: Diagnosis not present

## 2021-04-09 DIAGNOSIS — F411 Generalized anxiety disorder: Secondary | ICD-10-CM | POA: Diagnosis not present

## 2021-04-09 DIAGNOSIS — G4700 Insomnia, unspecified: Secondary | ICD-10-CM

## 2021-04-09 MED ORDER — CLONAZEPAM 0.5 MG PO TABS: ORAL_TABLET | ORAL | 0 refills | Status: DC

## 2021-04-09 MED ORDER — DOXEPIN HCL 100 MG PO CAPS
100.0000 mg | ORAL_CAPSULE | Freq: Every day | ORAL | 0 refills | Status: DC
Start: 2021-04-09 — End: 2021-05-07

## 2021-04-09 MED ORDER — DIVALPROEX SODIUM ER 500 MG PO TB24: 1500.0000 mg | ORAL_TABLET | Freq: Every day | ORAL | 1 refills | Status: DC

## 2021-04-09 MED ORDER — PRAZOSIN HCL 2 MG PO CAPS
2.0000 mg | ORAL_CAPSULE | Freq: Every day | ORAL | 0 refills | Status: DC
Start: 2021-04-09 — End: 2021-05-07

## 2021-04-09 MED ORDER — BUPROPION HCL ER (XL) 150 MG PO TB24
450.0000 mg | ORAL_TABLET | Freq: Every day | ORAL | 0 refills | Status: DC
Start: 2021-04-09 — End: 2021-05-07

## 2021-04-09 NOTE — Progress Notes (Signed)
Virtual Visit via Video Note  I connected with [name] on 04/09/21 at  8:45 AM EST by a video enabled telemedicine application and verified that I am speaking with the correct person using two identifiers.  Location: Patient: at a hotel Provider: office   I discussed the limitations of evaluation and management by telemedicine and the availability of in person appointments. The patient expressed understanding and agreed to proceed.  History of Present Illness: [name] has been feeling more depressed and anxiety. She is feeling exhausted. She has trouble lifting her leg over the bathtub and has fallen twice because of it. She is looking into getting a shower. 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 [name] has been trying to get her ex- fiance out of the house for the last 3 months. He is supposed to out by the end of November. She has been staying in a hotel for 3 days so that she can get a break from the stress at home. [name]'s rent has increased by $200/month and it is straining her budget. Her depression comes and goes and it very related to her home situation. The level is about 6/10. She is still not able to talk to daughter in a year because of her ex-fiance. Her sleep is broken and she is getting about 4-5 hrs/night. She is not always able to fall back asleep. It is due to anxiety and feeling overwhelmed. [name] is procrastinating on a lot of things. Her appetite is good and she is working on losing weight with her doctor. She has lost 22 lbs and she is eating multiple times a day. [name] is less irritable but wants to spend her time in bed on bad days. She gets very angry when dealing with her ex-fiance. She denies passive thoughts of death then later stated she sometimes and denies SI/HI. She is taking Klonopin BID and it helps a little. She was prescribed Xanax by neurologist and feels it is more helpful. It calms her and helps her think  straight. [name] denies any recent manic and hypomanic symptoms including periods of decreased need for sleep, increased energy, mood lability, impulsivity, FOI, and excessive spending.    Observations/Objective: Psychiatric Specialty Exam: ROS  There were no vitals taken for this visit.There is no height or weight on file to calculate BMI.  General Appearance: Casual  Eye Contact:  Fair  Speech:  Clear and Coherent and Normal Rate  Volume:  Normal  Mood:  Anxious and Depressed  Affect:  Congruent  Thought Process:  Goal Directed, Linear, and Descriptions of Associations: Intact  Orientation:  Full (Time, Place, and Person)  Thought Content:  Logical  Suicidal Thoughts:  No  Homicidal Thoughts:  No  Memory:  Immediate;   Good  Judgement:  Good  Insight:  Good  Psychomotor Activity:  Normal  Concentration:  Concentration: Good  Recall:  Good  Fund of Knowledge:  Good  Language:  Good  Akathisia:  No  Handed:  Right  AIMS (if indicated):     Assets:  Communication Skills Desire for Improvement Financial Resources/Insurance Housing Resilience Social Support Talents/Skills Transportation Vocational/Educational  ADL's:  Intact  Cognition:  WNL  Sleep:        Assessment and Plan: Depression screen [facility] 2/9 04/09/2021 11/20/2020 09/18/2020 03/27/2019  Decreased Interest 2 1 3 3   Down, Depressed, Hopeless 2 1 3 3   PHQ - 2 Score 4 2 6 6   Altered  sleeping 2 1 3 3   Tired, decreased energy 3 3 3 3   Change in appetite 0 2 3 3   Feeling bad or failure about yourself  2 1 3 3   Trouble concentrating 2 1 3 3   Moving slowly or fidgety/restless 1 0 - 3  Suicidal thoughts 0 0 0 2  PHQ-9 Score 14 10 21 26   Difficult doing work/chores Extremely dIfficult Very difficult Extremely dIfficult Extremely dIfficult  Some recent data might be hidden    Flowsheet Row Video Visit [facility]/09/2021 in [facility] Video Visit from 11/20/2020 in [facility] ED to Hosp-Admission (Discharged) from 10/25/2020 in [place] LONG 4TH FLOOR PROGRESSIVE CARE AND UROLOGY  C-SSRS RISK CATEGORY No Risk No Risk No Risk       - neurologist started on her Xanax 0.25mg  po qD prn anxiety. She has extreme anxiety before migraine injections. She has been taking it 1-2x/week. If she takes Xanax then she doesn't take Klonopin.   I believe she has developed a tolerance to Klonopin so I am tapering it off over the next 1 month.   - increase Depakote ER 1500mg  po qHS   1. Other disorder of eating - buPROPion (WELLBUTRIN XL) 150 MG 24 hr tablet; Take 3 tablets (450 mg total) by mouth daily.  Dispense: 270 tablet; Refill: 0  2. Panic disorder with agoraphobia - clonazePAM (KLONOPIN) 0.5 MG tablet; Take 1 tablet (0.5 mg total) by mouth 2 (two) times daily as needed for 14 days for anxiety, THEN 1 tablet (0.5 mg total) daily as needed for up to 14 days for anxiety.  Dispense: 42 tablet; Refill: 0  3. GAD (generalized anxiety disorder) - clonazePAM (KLONOPIN) 0.5 MG tablet; Take 1 tablet (0.5 mg total) by mouth 2 (two) times daily as needed for 14 days for anxiety, THEN 1 tablet (0.5 mg total) daily as needed for up to 14 days for anxiety.  Dispense: 42 tablet; Refill: 0 - doxepin (SINEQUAN) 100 MG capsule; Take 1 capsule (100 mg total) by mouth at bedtime.  Dispense: 90 capsule; Refill: 0  4. Bipolar II disorder (HCC) - divalproex (DEPAKOTE ER) 500 MG 24 hr tablet; Take 3 tablets (1,500 mg total) by mouth daily.  Dispense: 90 tablet; Refill: 1 - doxepin (SINEQUAN) 100 MG capsule; Take 1 capsule (100 mg total) by mouth at bedtime.  Dispense: 90 capsule; Refill: 0  5. Insomnia, unspecified type - doxepin (SINEQUAN) 100 MG capsule; Take 1 capsule (100 mg total) by mouth at bedtime.  Dispense: 90 capsule; Refill: 0 - prazosin (MINIPRESS) 2 MG capsule; Take 1 capsule (2 mg total) by mouth at bedtime.  Dispense: 90 capsule; Refill:  0   Follow Up Instructions: In 4-6 weeks or sooner if needed   I discussed the assessment and treatment plan with the patient. The patient was provided an opportunity to ask questions and all were answered. The patient agreed with the plan and demonstrated an understanding of the instructions.   The patient was advised to call back or seek an in-person evaluation if the symptoms worsen or if the condition fails to improve as anticipated.  I provided 29 minutes of non-face-to-face time during this encounter.   , MD

## 2021-04-17 ENCOUNTER — Other Ambulatory Visit (INDEPENDENT_AMBULATORY_CARE_PROVIDER_SITE_OTHER): Payer: Self-pay | Admitting: Bariatrics

## 2021-04-17 ENCOUNTER — Other Ambulatory Visit (INDEPENDENT_AMBULATORY_CARE_PROVIDER_SITE_OTHER): Payer: Self-pay | Admitting: Family Medicine

## 2021-04-17 DIAGNOSIS — R948 Abnormal results of function studies of other organs and systems: Secondary | ICD-10-CM

## 2021-04-17 DIAGNOSIS — J309 Allergic rhinitis, unspecified: Secondary | ICD-10-CM

## 2021-04-17 DIAGNOSIS — R600 Localized edema: Secondary | ICD-10-CM

## 2021-04-17 DIAGNOSIS — K5909 Other constipation: Secondary | ICD-10-CM

## 2021-04-20 ENCOUNTER — Ambulatory Visit: Payer: Medicare Other | Admitting: Neurology

## 2021-04-20 NOTE — Telephone Encounter (Signed)
LAST APPOINTMENT DATE: 04/07/21 NEXT APPOINTMENT DATE: 05/04/21   [id number] [place], [place] - [address] [place] [address] Phone: [phone] Fax: [phone]  [facility] - [place] ([place]), [place] - [address] [place] ([place]) [place] [address] Phone: [phone] Fax: [phone]  Patient is requesting a refill of the following medications: Pending Prescriptions:                       Disp   Refills   hydrochlorothiazide (MICROZIDE) 12.5 MG ca*30 cap*0       Sig: Take 1 capsule (12.5 mg total) by mouth daily as          needed (edema).   Date last filled: 02/24/21 Previously prescribed by Dr. Earlene Plater  Lab Results      Component                Value               Date                      HGBA1C                   5.1                 07/09/2020                HGBA1C                   5.6                 12/20/2019                HGBA1C                   5.5                 06/27/2019           Lab Results      Component                Value               Date                      LDLCALC                  67                  07/09/2020                CREATININE               0.79                10/28/2020           Lab Results      Component                Value               Date                      VD25OH                   17.9 (L)            07/09/2020  VD25OH                   22.5 (L)            12/20/2019                VD25OH                   20.1 (L)            06/27/2019            BP Readings from Last 3 Encounters: 04/07/21 : 113/81 02/24/21 : 127/83 01/15/21 : 108/69

## 2021-04-20 NOTE — Telephone Encounter (Signed)
Pt last seen by Dr. Brown.  

## 2021-04-20 NOTE — Telephone Encounter (Signed)
LOV Dr. Manson Passey

## 2021-04-20 NOTE — Telephone Encounter (Signed)
LOV w, Dr. Manson Passey

## 2021-04-21 ENCOUNTER — Encounter: Payer: Self-pay | Admitting: Neurology

## 2021-04-21 ENCOUNTER — Ambulatory Visit (INDEPENDENT_AMBULATORY_CARE_PROVIDER_SITE_OTHER): Payer: Medicare Other | Admitting: Neurology

## 2021-04-21 ENCOUNTER — Telehealth: Payer: Self-pay | Admitting: Neurology

## 2021-04-21 DIAGNOSIS — G43711 Chronic migraine without aura, intractable, with status migrainosus: Secondary | ICD-10-CM

## 2021-04-21 MED ORDER — EMGALITY 120 MG/ML ~~LOC~~ SOAJ: SUBCUTANEOUS | 11 refills | Status: DC

## 2021-04-21 NOTE — Progress Notes (Signed)
Consent Form Botulism Toxin Injection For Chronic Migraine   04/21/2021: stable, still doing same if not better > 60% improvement. She broke up with her boyfriend. Patient states she did not take the xanax, she is here alone, I warned her not to drive if she takes xanax or clonazepam and have someone drive her.   1/94/1740: getting back on track. Will give some xanax for next time she is incredibly stressed when she comes in, she is lovely, here with fiance  Interval history 04/28/2020: Doing great, More than 60% improvement in frequency but also in severity. Ajovy not approve using emgality instead. she has failed imitrex and maxalt and cannot take anymore triptans, contraindicated due to side effects, gave her some samples. She feels clenching is a problem and she has pain when clenching in the temples as well and we give her muscle relaxers since we cannot put botox there. She had HTN on aimovig, stopped. She had botox in the past and we restarted on 03/2019, doing great. Loves emgality as an add on as well. No date set for her wedding yet, it has been 2.5 years. She is very anxious and took anxiety medication today and did better but she is very anxious. She wants an outdoor wedding. She did much better, she took her anxiety and pain meds. [facility]    Reviewed orally with patient, additionally signature is on file:  Botulism toxin has been approved by the Federal drug administration for treatment of chronic migraine. Botulism toxin does not cure chronic migraine and it may not be effective in some patients.  The administration of botulism toxin is accomplished by injecting a small amount of toxin into the muscles of the neck and head. Dosage must be titrated for each individual. Any benefits resulting from botulism toxin tend to wear off after 3 months with a repeat injection required if benefit is to be maintained. Injections are usually done  every 3-4 months with maximum effect peak achieved by about 2 or 3 weeks. Botulism toxin is expensive and you should be sure of what costs you will incur resulting from the injection.  The side effects of botulism toxin use for chronic migraine may include:   -Transient, and usually mild, facial weakness with facial injections  -Transient, and usually mild, head or neck weakness with head/neck injections  -Reduction or loss of forehead facial animation due to forehead muscle weakness  -Eyelid drooping  -Dry eye  -Pain at the site of injection or bruising at the site of injection  -Double vision  -Potential unknown long term risks  Contraindications: You should not have Botox if you are pregnant, nursing, allergic to albumin, have an infection, skin condition, or muscle weakness at the site of the injection, or have myasthenia gravis, Lambert-Eaton syndrome, or ALS.  It is also possible that as with any injection, there may be an allergic reaction or no effect from the medication. Reduced effectiveness after repeated injections is sometimes seen and rarely infection at the injection site may occur. All care will be taken to prevent these side effects. If therapy is given over a long time, atrophy and wasting in the muscle injected may occur. Occasionally the patient's become refractory to treatment because they develop antibodies to the toxin. In this event, therapy needs to be modified.  I have read the above information and consent to the administration of botulism toxin.    BOTOX PROCEDURE NOTE FOR MIGRAINE  HEADACHE    Contraindications and precautions discussed with patient(above). Aseptic procedure was observed and patient tolerated procedure. Procedure performed by Dr. Artemio Aly  The condition has existed for more than 6 months, and pt does not have a diagnosis of ALS, Myasthenia Gravis or Lambert-Eaton Syndrome.  Risks and benefits of injections discussed and pt agrees to proceed  with the procedure.  Written consent obtained  These injections are medically necessary. Pt  receives good benefits from these injections. These injections do not cause sedations or hallucinations which the oral therapies may cause.  Description of procedure:  The patient was placed in a sitting position. The standard protocol was used for Botox as follows, with 5 units of Botox injected at each site:   -Procerus muscle, midline injection  -Corrugator muscle, bilateral injection  -Frontalis muscle, bilateral injection, with 2 sites each side, medial injection was performed in the upper one third of the frontalis muscle, in the region vertical from the medial inferior edge of the superior orbital rim. The lateral injection was again in the upper one third of the forehead vertically above the lateral limbus of the cornea, 1.5 cm lateral to the medial injection site.  -Temporalis muscle injection, 4 sites, bilaterally. The first injection was 3 cm above the tragus of the ear, second injection site was 1.5 cm to 3 cm up from the first injection site in line with the tragus of the ear. The third injection site was 1.5-3 cm forward between the first 2 injection sites. The fourth injection site was 1.5 cm posterior to the second injection site.   -Occipitalis muscle injection, 3 sites, bilaterally. The first injection was done one half way between the occipital protuberance and the tip of the mastoid process behind the ear. The second injection site was done lateral and superior to the first, 1 fingerbreadth from the first injection. The third injection site was 1 fingerbreadth superiorly and medially from the first injection site.  -Cervical paraspinal muscle injection, 2 sites, bilateral knee first injection site was 1 cm from the midline of the cervical spine, 3 cm inferior to the lower border of the occipital protuberance. The second injection site was 1.5 cm superiorly and laterally to the first  injection site.  -Trapezius muscle injection was performed at 3 sites, bilaterally. The first injection site was in the upper trapezius muscle halfway between the inflection point of the neck, and the acromion. The second injection site was one half way between the acromion and the first injection site. The third injection was done between the first injection site and the inflection point of the neck.   Will return for repeat injection in 3 months.   155 units of Botox was used, 45u Botox not injected was wasted. The patient tolerated the procedure well, there were no complications of the above procedure.

## 2021-04-21 NOTE — Progress Notes (Signed)
Botox- 200 units x 1 vial Lot: [id number] Expiration: 09/2023 NDC: 0023-3921-02  Bacteriostatic 0.9% Sodium Chloride- 4mL total Lot: [id number] Expiration: 05/24/2022 NDC: 0409-1966-02  Dx: G43.711 B/B  

## 2021-04-21 NOTE — Telephone Encounter (Signed)
Patient has Botox injection today. Obtained PA from [facility]. Dx: N00.370. PA #[id number] (04/21/21- 04/21/22).

## 2021-04-22 ENCOUNTER — Ambulatory Visit: Payer: Medicare Other | Admitting: Neurology

## 2021-04-27 ENCOUNTER — Encounter: Payer: Self-pay | Admitting: Physical Therapy

## 2021-04-27 ENCOUNTER — Telehealth (INDEPENDENT_AMBULATORY_CARE_PROVIDER_SITE_OTHER): Payer: Self-pay

## 2021-04-27 ENCOUNTER — Ambulatory Visit: Payer: Medicare Other | Attending: Nurse Practitioner | Admitting: Physical Therapy

## 2021-04-27 ENCOUNTER — Other Ambulatory Visit: Payer: Self-pay

## 2021-04-27 DIAGNOSIS — M25611 Stiffness of right shoulder, not elsewhere classified: Secondary | ICD-10-CM | POA: Insufficient documentation

## 2021-04-27 DIAGNOSIS — R293 Abnormal posture: Secondary | ICD-10-CM | POA: Insufficient documentation

## 2021-04-27 DIAGNOSIS — M6281 Muscle weakness (generalized): Secondary | ICD-10-CM | POA: Diagnosis present

## 2021-04-27 DIAGNOSIS — M25511 Pain in right shoulder: Secondary | ICD-10-CM | POA: Insufficient documentation

## 2021-04-27 DIAGNOSIS — E1169 Type 2 diabetes mellitus with other specified complication: Secondary | ICD-10-CM

## 2021-04-27 NOTE — Telephone Encounter (Signed)
Dr. Earlene Plater prescribed a new medication (injectable) but [facility] on [address] is telling the patient they can't get this medication in.  Please advise patient.

## 2021-04-27 NOTE — Therapy (Signed)
[facility] [address] [place], [place], [address] Phone: [phone]   Fax:  [phone]  Physical Therapy Evaluation  Patient Details  Name: [name] MRN: [id number] Date of Birth: 15-May-1966 Referring Provider (PT): Zoe Lan   Encounter Date: 04/27/2021   PT End of Session - 04/27/21 1711     Visit Number 1    Number of Visits 9    Date for PT Re-Evaluation 05/26/21    Authorization Type MCR and MCD    Authorization Time Period 04/27/21 to 05/26/21    Progress Note Due on Visit 10    PT Start Time 1449    PT Stop Time 1532    PT Time Calculation (min) 43 min    Activity Tolerance Patient tolerated treatment well    Behavior During Therapy [facility] for tasks assessed/performed             Past Medical History:  Diagnosis Date   Allergic rhinitis    Allergic to cats    pet dander   Allergy    Anemia    Anxiety    Arthritis 09/2018   both feet   Asthma    Back pain    Bipolar disorder (HCC)    Cervicalgia    Chronic fatigue syndrome    Constipation    Depression    Diabetes mellitus without complication (HCC)    "pre"   Dyspnea    Environmental allergies    Fibromyalgia    GERD (gastroesophageal reflux disease)    Grave's disease    Heart murmur    High cholesterol    History of blood clots    Hypertension    Joint pain    Lactose intolerance    Lower extremity edema    Multiple food allergies    OSA (obstructive sleep apnea)    Osteoporosis    Palpitations    Panic disorder    Rheumatoid arthritis (HCC)    Risk for falls    Sciatica    Severe 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 (HCC)    Sleep apnea    no cpap worn   Syncope    Syncope and collapse    Thyroid disease    Vasovagal syncope    Vertigo    Vitamin D deficiency     Past Surgical History:  Procedure Laterality Date   ABDOMINAL HYSTERECTOMY  2006   COLONOSCOPY     GASTRIC BYPASS  2008     There were no vitals filed for this visit.    Subjective Assessment - 04/27/21 1451     Subjective My right shoulder has been bothering me for about 3 weeks now, I did have a fall out of my garden tub but I hit my left arm not the right. I'm right handed so this is a problem. It bothers me more when the weather changes, and when I sleep on my right side its more painful. Texting and gripping are hard, I drop a lot of stuff. No hx of other should or neck injuries.    Patient Stated Goals get rid of pain    Currently in Pain? Yes   took medicine about 90 minutes ago   Pain Score 3    at worst can be 10/10   Pain Location Arm    Pain Orientation Right    Pain Descriptors / Indicators Discomfort;Sharp;Squeezing;Spasm   like I've been squeezing it in cold ice  Pain Type Acute pain    Pain Radiating Towards from shoulder down to middle and right index fingers    Pain Onset 1 to 4 weeks ago    Pain Frequency Constant    Aggravating Factors  laying on right side, lifting anything above head, lifting a pot when cooking    Pain Relieving Factors pain medicine, heat    Effect of Pain on Daily Activities moderate-severe                [facility] PT Assessment - 04/27/21 0001       Assessment   Medical Diagnosis R shoulder pain    Referring Provider (PT) Eldridge Abrahams    Onset Date/Surgical Date --   about 3 weeks ago   Next MD Visit ortho appt tomorrow, Eldridge Abrahams February 2023    Prior Therapy PT here in the past for achilles      Precautions   Precautions None    Precaution Comments hx of syncope      Restrictions   Weight Bearing Restrictions No      Balance Screen   Has the patient fallen in the past 6 months Yes    How many times? I fall very 2-3 months due to vertigo and syncope    Has the patient had a decrease in activity level because of a fear of falling?  Yes    Is the patient reluctant to leave their home because of a fear of falling?  No      Home Social research officer, government residence      Prior Function   Level of Independence Independent;Independent with basic ADLs    Vocation On disability    Leisure play with son's dog      Observation/Other Assessments   Focus on Therapeutic Outcomes (FOTO)  40      Posture/Postural Control   Posture/Postural Control Postural limitations    Postural Limitations Rounded Shoulders;Forward head;Increased thoracic kyphosis      AROM   AROM Assessment Site Shoulder;Cervical;Thoracic    Right/Left Shoulder Left;Right    Right Shoulder Flexion 85 Degrees    Right Shoulder ABduction 80 Degrees    Right Shoulder Internal Rotation --   FIR L1   Right Shoulder External Rotation --   FER unable to even get to nape of neck- got to 90/90 position   Left Shoulder Flexion 140 Degrees    Left Shoulder ABduction 100 Degrees    Left Shoulder Internal Rotation --   FIR T4   Left Shoulder External Rotation --   FER to nape of neck only   Cervical Flexion WNL    Cervical Extension 20% limited    Cervical - Right Side Bend WNL    Cervical - Left Side Bend 50% limited    Cervical - Right Rotation 50% limited    Cervical - Left Rotation WNL    Thoracic Flexion severe limitation pain R shoulder    Thoracic Extension severe limitation pain R shoulder    Thoracic - Right Side Bend moderate limitation    Thoracic - Left Side Bend severe limitation    Thoracic - Right Rotation severe limitation    Thoracic - Left Rotation severe limitation      Strength   Overall Strength Comments grip strength grossly symmetrical, fine motor intact    Strength Assessment Site Shoulder;Elbow;Wrist    Right/Left Shoulder Right;Left    Right Shoulder Flexion 4/5    Right Shoulder ABduction  3-/5    Right Shoulder Internal Rotation 3-/5    Right Shoulder External Rotation 3-/5    Right Shoulder Horizontal ABduction 3-/5    Left Shoulder Flexion 4/5    Left Shoulder Extension 4/5    Left Shoulder Internal Rotation 4/5     Left Shoulder External Rotation 4/5    Right/Left Elbow Right;Left    Right Elbow Flexion 3/5    Right Elbow Extension 3/5    Left Elbow Flexion 4+/5    Left Elbow Extension 4+/5    Right/Left Wrist Right;Left      Palpation   Palpation comment extremely TTP R UT, deltoid, biceps                        Objective measurements completed on examination: See above findings.       [place] Adult PT Treatment/Exercise - 04/27/21 0001       Exercises   Exercises Shoulder      Shoulder Exercises: Seated   Other Seated Exercises backwards shoulder rolls 1x15, scapular retractions 1x15, thoracic extensions 1x10                     PT Education - 04/27/21 1710     Education provided Yes    Education Details exam findings, POC, HEP; possible transfer to [facility] for water exercises    Person(s) Educated Patient    Methods Explanation;Demonstration;Handout    Comprehension Verbalized understanding;Returned demonstration              PT Short Term Goals - 04/27/21 1715       PT SHORT TERM GOAL #1   Title Will be independent with appropriate HEP, to be updated PRN    Time 2    Period Weeks    Status New    Target Date 05/11/21               PT Long Term Goals - 04/27/21 1716       PT LONG TERM GOAL #1   Title Pain will be no more than 7/10 at worst    Time 4    Period Weeks    Status New    Target Date 05/26/21      PT LONG TERM GOAL #2   Title Cervical and thoracic mobility to improve by at least 50% in order to reduce pain and help improve posture    Time 4    Period Weeks    Status New      PT LONG TERM GOAL #3   Title R shoulder ROM to be equal to that of the L in order to improve function    Time 4    Period Weeks    Status New      PT LONG TERM GOAL #4   Title MMT to have improved by at least 1 grade in all tested groups in order to assist in reducing pain and improving function    Time 4    Period Weeks    Status  New                    Plan - 04/27/21 1712     Clinical Impression Statement Ms. Winkle arrives today doing well, but having quite a bit of pain in her right shoulder; of note, she does also have fibromyalgia which somewhat contributes to her pain as well. Exam reveals postural impairment,  limited ROM cervical/thoracic/B shoulders,  limited mm strength, and impaired functional task performance including difficulty gripping and holding onto small items. Interested in trying to transition to water therapy at [facility] if possible. Will attempt to facilitate this, otherwise will progress as able and tolerated in this clinic.    Personal Factors and Comorbidities Age;Fitness;Behavior Pattern;Past/Current Experience;Comorbidity 3+;Time since onset of injury/illness/exacerbation    Examination-Activity Limitations Bathing;Bed Mobility;Reach Overhead;Caring for Others;Sleep;Carry;Dressing;Hygiene/Grooming;Lift;Toileting    Examination-Participation Restrictions Meal Prep;Cleaning;Occupation;Community Activity;Driving;Medication Management;Dorita Sciara    Stability/Clinical Decision Making Evolving/Moderate complexity    Clinical Decision Making Moderate    Rehab Potential Fair    PT Frequency 2x / week    PT Duration 4 weeks    PT Treatment/Interventions ADLs/Self Care Home Management;Cryotherapy;Electrical Stimulation;Iontophoresis 4mg /ml Dexamethasone;Moist Heat;Ultrasound;Gait training;Balance training;Therapeutic exercise;Therapeutic activities;Functional mobility training;Stair training;Patient/family education;Manual techniques;Vasopneumatic Device;Taping;Passive range of motion;Dry needling    PT Next Visit Plan review HEP and form; gentle progression of mobility and strength for cervical and thoracic spines and R shoulder, STM and strength as tolerated    PT Home Exercise Plan [id number]    Consulted and Agree with Plan of Care Patient             Patient will benefit from  skilled therapeutic intervention in order to improve the following deficits and impairments:  Decreased range of motion, Increased fascial restricitons, Increased muscle spasms, Impaired UE functional use, Decreased activity tolerance, Pain, Impaired flexibility, Decreased strength, Postural dysfunction, Decreased mobility  Visit Diagnosis: Acute pain of right shoulder  Stiffness of right shoulder, not elsewhere classified  Abnormal posture  Muscle weakness (generalized)     Problem List Patient Active Problem List   Diagnosis Date Noted   Acute respiratory disease due to COVID-19 virus 10/25/2020   AKI (acute kidney injury) ([place]) 99991111   Metabolic syndrome 99991111   Constipation 07/09/2020   Subclinical hypothyroidism 05/11/2019   Prediabetes 05/11/2019   Vitamin D deficiency 05/11/2019   OSA (obstructive sleep apnea) 05/11/2019   Intractable chronic migraine without aura and with status migrainosus 01/14/2016   Risk for falls 09/02/2015   Insomnia 05/27/2015   Severe depressed bipolar II disorder without psychotic features ([place]) 02/25/2015   Class 3 severe obesity with serious comorbidity and body mass index (BMI) of 40.0 to 44.9 in adult ([place]) 11/14/2014   Severe 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 ([place]) 10/10/2014   GAD (generalized anxiety disorder) 10/10/2014   Panic disorder with agoraphobia 10/10/2014   Social anxiety disorder 10/10/2014   Jaw pain-acute TMJ 09/24/14 09/24/2014   Asthma 08/01/2014   Low back pain 07/24/2014   Encounter for monitoring opioid maintenance therapy 07/02/2014   Pharyngoesophageal dysphagia 06/24/2014   S/P gastric bypass 06/24/2014   Protein-calorie malnutrition, severe ([place]) 11/03/2012   Drug overdose, multiple drugs 11/01/2012   Migraine 05/22/2012   Depression 11/22/2011   Chronic pain 11/22/2011   Graves' disease    GERD (gastroesophageal reflux disease)    Need for prophylactic postmenopausal  hormone replacement therapy 11/03/2011   Allergic rhinitis 09/28/2011   Anemia 09/28/2011   Essential hypertension 09/28/2011   Ann Lions PT, DPT, PN2   Supplemental Physical Therapist [place]    Pager [phone] Acute Rehab Office [facility]. [place], [place], [address] Phone: [phone]   Fax:  [phone]  Name: [name] MRN: [id number] Date of Birth: [birth year]

## 2021-04-28 MED ORDER — OZEMPIC (1 MG/DOSE) 4 MG/3ML ~~LOC~~ SOPN
1.0000 mg | PEN_INJECTOR | SUBCUTANEOUS | 0 refills | Status: DC
Start: 2021-04-28 — End: 2021-05-05

## 2021-04-28 NOTE — Addendum Note (Signed)
Addended by: Scarlett Presto on: 04/28/2021 10:51 AM   Modules accepted: Orders

## 2021-04-29 ENCOUNTER — Ambulatory Visit: Payer: Medicare Other | Admitting: Physical Therapy

## 2021-05-04 ENCOUNTER — Ambulatory Visit (INDEPENDENT_AMBULATORY_CARE_PROVIDER_SITE_OTHER): Payer: Medicare Other | Admitting: Adult Health

## 2021-05-05 ENCOUNTER — Other Ambulatory Visit: Payer: Self-pay

## 2021-05-05 ENCOUNTER — Encounter (INDEPENDENT_AMBULATORY_CARE_PROVIDER_SITE_OTHER): Payer: Self-pay | Admitting: Family Medicine

## 2021-05-05 ENCOUNTER — Ambulatory Visit (INDEPENDENT_AMBULATORY_CARE_PROVIDER_SITE_OTHER): Payer: Medicare Other | Admitting: Family Medicine

## 2021-05-05 ENCOUNTER — Ambulatory Visit: Payer: Medicare Other | Admitting: Physical Therapy

## 2021-05-05 VITALS — BP 107/75 | HR 97 | Temp 97.5°F | Ht 65.0 in | Wt 235.0 lb

## 2021-05-05 DIAGNOSIS — M25511 Pain in right shoulder: Secondary | ICD-10-CM

## 2021-05-05 DIAGNOSIS — R948 Abnormal results of function studies of other organs and systems: Secondary | ICD-10-CM | POA: Diagnosis not present

## 2021-05-05 DIAGNOSIS — R293 Abnormal posture: Secondary | ICD-10-CM

## 2021-05-05 DIAGNOSIS — K5909 Other constipation: Secondary | ICD-10-CM | POA: Diagnosis not present

## 2021-05-05 DIAGNOSIS — M6281 Muscle weakness (generalized): Secondary | ICD-10-CM

## 2021-05-05 DIAGNOSIS — E1169 Type 2 diabetes mellitus with other specified complication: Secondary | ICD-10-CM

## 2021-05-05 DIAGNOSIS — F509 Eating disorder, unspecified: Secondary | ICD-10-CM | POA: Diagnosis not present

## 2021-05-05 DIAGNOSIS — M25611 Stiffness of right shoulder, not elsewhere classified: Secondary | ICD-10-CM

## 2021-05-05 DIAGNOSIS — Z6841 Body Mass Index (BMI) 40.0 and over, adult: Secondary | ICD-10-CM

## 2021-05-05 MED ORDER — LINACLOTIDE 290 MCG PO CAPS: 290.0000 ug | ORAL_CAPSULE | Freq: Every day | ORAL | 0 refills | Status: DC

## 2021-05-05 MED ORDER — TOPIRAMATE 25 MG PO TABS: 25.0000 mg | ORAL_TABLET | Freq: Every day | ORAL | 0 refills | Status: DC

## 2021-05-05 MED ORDER — OZEMPIC (1 MG/DOSE) 4 MG/3ML ~~LOC~~ SOPN: 1.0000 mg | PEN_INJECTOR | SUBCUTANEOUS | 0 refills | Status: DC

## 2021-05-05 NOTE — Patient Instructions (Addendum)
  Aquatic Therapy: What to Expect!  Where:  [facility] [address] [place], [place]  [address] [phone]  NOTE:  You will receive an automated phone message reminding you of your appointment and it will say the appointment is at the Rehab Center on [address]  We are working to fix this- just know that you will meet us at the pool!  How to Prepare: Please make sure you drink 8 ounces of water about one hour prior to your pool session A caregiver MUST attend the entire session with the patient.  The caregiver will be responsible for assisting with dressing as well as any toileting needs.  If the patient will be doing a home program this should likely be the person who will assist as well.  Patients must wear either their street shoes or pool shoes until they are ready to enter the pool with the therapist.  Patients must also wear either street shoes or pool shoes once exiting the pool to walk to the locker room.  This will helps us prevent slips and falls.  Please arrive 15 minutes early to prepare for your pool therapy session Sign in at the front desk on the clipboard marked for [place] You may use the locker rooms on your right and then enter directly into the recreation pool (NOT the competition pool) Please make sure to attend to any toileting needs prior to entering the pool Please be dressed in your swim suit and on the pool deck at least 5 minutes before your appointment Once on the pool deck your therapist will ask you to sign the Patient  Consent and Assignment of Benefits form Your therapist may take your blood pressure prior to, during and after your session if indicated  About the pool  and parking: Entering the pool Your therapist will assist you; there are 2 ways to enter:  stairs with railings or with a chair lift.   Your therapist will determine the most appropriate way for you. Water temperature is usually between 86-87 degrees There  may be other swimmers in the pool at the same time Parking is free.        

## 2021-05-05 NOTE — Therapy (Signed)
[place] [place], [place], [address] Phone: [phone]   Fax:  [phone]  Physical Therapy Treatment  Patient Details  Name: [name] MRN: [id number] Date of Birth: July 13, 1965 Referring Provider (PT): Eldridge Abrahams   Encounter Date: 05/05/2021   PT End of Session - 05/05/21 1312     Visit Number 2    Number of Visits 9    Date for PT Re-Evaluation 05/26/21    Authorization Type MCR and MCD    Authorization Time Period 04/27/21 to 05/26/21    Progress Note Due on Visit 10    PT Start Time 1312    PT Stop Time [id number]    PT Time Calculation (min) 43 min    Activity Tolerance Patient tolerated treatment well    Behavior During Therapy [facility] for tasks assessed/performed             Past Medical History:  Diagnosis Date   Allergic rhinitis    Allergic to cats    pet dander   Allergy    Anemia    Anxiety    Arthritis 09/2018   both feet   Asthma    Back pain    Bipolar disorder ([place])    Cervicalgia    Chronic fatigue syndrome    Constipation    Depression    Diabetes mellitus without complication ([place])    "pre"   Dyspnea    Environmental allergies    Fibromyalgia    GERD (gastroesophageal reflux disease)    Grave's disease    Heart murmur    High cholesterol    History of blood clots    Hypertension    Joint pain    Lactose intolerance    Lower extremity edema    Multiple food allergies    OSA (obstructive sleep apnea)    Osteoporosis    Palpitations    Panic disorder    Rheumatoid arthritis ([place])    Risk for falls    Sciatica    Severe 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 ([place])    Sleep apnea    no cpap worn   Syncope    Syncope and collapse    Thyroid disease    Vasovagal syncope    Vertigo    Vitamin D deficiency     Past Surgical History:  Procedure Laterality Date   ABDOMINAL HYSTERECTOMY  2006   COLONOSCOPY     GASTRIC BYPASS  2008     There were no vitals filed for this visit.   Subjective Assessment - 05/05/21 1318     Subjective Pt reports when it really rains or cold I can feel the ache. I've been exercising it more (Every other day).    Patient Stated Goals get rid of pain    Currently in Pain? Yes    Pain Score 3     Pain Location Arm    Pain Orientation Right    Pain Onset 1 to 4 weeks ago                [facility] PT Assessment - 05/05/21 0001       Assessment   Medical Diagnosis R shoulder pain    Referring Provider (PT) Eldridge Abrahams                           [facility] Adult PT Treatment/Exercise - 05/05/21 0001  Shoulder Exercises: Supine   External Rotation Strengthening;Both;20 reps;Theraband    Theraband Level (Shoulder External Rotation) Level 2 (Red)    Flexion AAROM;10 reps    Flexion Limitations dowel    ABduction AAROM;Right;10 reps    ABduction Limitations dowel      Shoulder Exercises: Seated   Retraction Strengthening;20 reps;Both    Other Seated Exercises backwards shoulder rolls 1x15, scapular retractions 1x15, thoracic seated extensions 1x10 against towel      Shoulder Exercises: Pulleys   Flexion 1 minute    Scaption 1 minute    ABduction 1 minute      Shoulder Exercises: Stretch   Other Shoulder Stretches Doorway pec stretch: low, middle and high x20 sec each      Manual Therapy   Manual Therapy Soft tissue mobilization    Soft tissue mobilization STM and TPR UT, suboccipitals, cervical paraspinals, pecs                       PT Short Term Goals - 04/27/21 1715       PT SHORT TERM GOAL #1   Title Will be independent with appropriate HEP, to be updated PRN    Time 2    Period Weeks    Status New    Target Date 05/11/21               PT Long Term Goals - 04/27/21 1716       PT LONG TERM GOAL #1   Title Pain will be no more than 7/10 at worst    Time 4    Period Weeks    Status New    Target Date 05/26/21      PT  LONG TERM GOAL #2   Title Cervical and thoracic mobility to improve by at least 50% in order to reduce pain and help improve posture    Time 4    Period Weeks    Status New      PT LONG TERM GOAL #3   Title R shoulder ROM to be equal to that of the L in order to improve function    Time 4    Period Weeks    Status New      PT LONG TERM GOAL #4   Title MMT to have improved by at least 1 grade in all tested groups in order to assist in reducing pain and improving function    Time 4    Period Weeks    Status New                   Plan - 05/05/21 1548     Clinical Impression Statement Treatment focused on continuing to improve mobility in C-spine and T-spine with stretching and manual therapy. STM of suboccipitals and UTs. Initiated shoulder AAROM and posterior shoulder girdle strengthening. Pt with some increase in her R hand/finger N/T particularly with pec and UT manual therapy. May consider possibility of some thoracic outlet impingement also affecting R UE. Able to initiate first aquatics visit for tomorrow.    Personal Factors and Comorbidities Age;Fitness;Behavior Pattern;Past/Current Experience;Comorbidity 3+;Time since onset of injury/illness/exacerbation    Examination-Activity Limitations Bathing;Bed Mobility;Reach Overhead;Caring for Others;Sleep;Carry;Dressing;Hygiene/Grooming;Lift;Toileting    Examination-Participation Restrictions Meal Prep;Cleaning;Occupation;Community Activity;Driving;Medication Management;Pincus Badder [facility]    Stability/Clinical Decision Making Evolving/Moderate complexity    Rehab Potential Fair    PT Frequency 2x / week    PT Duration 4 weeks    PT Treatment/Interventions ADLs/Self Care Home  Management;Cryotherapy;Electrical Stimulation;Iontophoresis 4mg /ml Dexamethasone;Moist Heat;Ultrasound;Gait training;Balance training;Therapeutic exercise;Therapeutic activities;Functional mobility training;Stair training;Patient/family education;Manual  techniques;Vasopneumatic Device;Taping;Passive range of motion;Dry needling    PT Next Visit Plan review HEP and form; gentle progression of mobility and strength for cervical and thoracic spines and R shoulder, STM and strength as tolerated    PT Home Exercise Plan [id number]    Consulted and Agree with Plan of Care Patient             Patient will benefit from skilled therapeutic intervention in order to improve the following deficits and impairments:  Decreased range of motion, Increased fascial restricitons, Increased muscle spasms, Impaired UE functional use, Decreased activity tolerance, Pain, Impaired flexibility, Decreased strength, Postural dysfunction, Decreased mobility  Visit Diagnosis: Acute pain of right shoulder  Stiffness of right shoulder, not elsewhere classified  Abnormal posture  Muscle weakness (generalized)     Problem List Patient Active Problem List   Diagnosis Date Noted   Acute respiratory disease due to COVID-19 virus 10/25/2020   AKI (acute kidney injury) ([place]) 99991111   Metabolic syndrome 99991111   Constipation 07/09/2020   Subclinical hypothyroidism 05/11/2019   Prediabetes 05/11/2019   Vitamin D deficiency 05/11/2019   OSA (obstructive sleep apnea) 05/11/2019   Intractable chronic migraine without aura and with status migrainosus 01/14/2016   Risk for falls 09/02/2015   Insomnia 05/27/2015   Severe depressed bipolar II disorder without psychotic features ([place]) 02/25/2015   Class 3 severe obesity with serious comorbidity and body mass index (BMI) of 40.0 to 44.9 in adult ([place]) 11/14/2014   Severe 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 ([place]) 10/10/2014   GAD (generalized anxiety disorder) 10/10/2014   Panic disorder with agoraphobia 10/10/2014   Social anxiety disorder 10/10/2014   Jaw pain-acute TMJ 09/24/14 09/24/2014   Asthma 08/01/2014   Low back pain 07/24/2014   Encounter for monitoring opioid maintenance  therapy 07/02/2014   Pharyngoesophageal dysphagia 06/24/2014   S/P gastric bypass 06/24/2014   Protein-calorie malnutrition, severe ([place]) 11/03/2012   Drug overdose, multiple drugs 11/01/2012   Migraine 05/22/2012   Depression 11/22/2011   Chronic pain 11/22/2011   Graves' disease    GERD (gastroesophageal reflux disease)    Need for prophylactic postmenopausal hormone replacement therapy 11/03/2011   Allergic rhinitis 09/28/2011   Anemia 09/28/2011   Essential hypertension 09/28/2011    Mavrik Bynum April Gordy Levan, PT, DPT 05/05/2021, 3:52 PM  [place] [place], [place], [address] Phone: [phone]   Fax:  [phone]  Name: [name] MRN: [id number] Date of Birth: Nov 18, 1965

## 2021-05-06 ENCOUNTER — Encounter (INDEPENDENT_AMBULATORY_CARE_PROVIDER_SITE_OTHER): Payer: Self-pay | Admitting: Family Medicine

## 2021-05-06 ENCOUNTER — Other Ambulatory Visit (HOSPITAL_COMMUNITY): Payer: Self-pay | Admitting: Psychiatry

## 2021-05-06 ENCOUNTER — Ambulatory Visit (HOSPITAL_BASED_OUTPATIENT_CLINIC_OR_DEPARTMENT_OTHER): Payer: Medicare Other | Admitting: Physical Therapy

## 2021-05-06 DIAGNOSIS — F3181 Bipolar II disorder: Secondary | ICD-10-CM

## 2021-05-06 NOTE — Progress Notes (Signed)
Chief Complaint:   OBESITY [name] is here to discuss her progress with her obesity treatment plan along with follow-up of her obesity related diagnoses. [name] is on practicing portion control and making smarter food choices, such as increasing vegetables and decreasing simple carbohydrates and states she is following her eating plan approximately 40% of the time. [name] states she is doing water aerobics and physical therapy for 30-45 minutes 2 times per week.  Today's visit was #: 25 Starting weight: 250 lbs Starting date: 03/27/2019 Today's weight: 235 lbs Today's date: 05/06/2021 Total lbs lost to date: 15 lbs Total lbs lost since last in-office visit: 0  Interim History: [name] skips breakfast and lunch and has protein shakes instead. She has been struggling with sweets cravings and notes she buys sweets for son and boyfriend but then she eats them also.She also drinks regular sodas.  Subjective:   1. Abnormal metabolism Loye has not lost weight on Phentermine. She has been on it a few months.   2. Other constipation [name]'s constipation is not well controlled. She is currently on Linzess 145 mcg. She has very hard stools.  3. Type 2 diabetes mellitus with other specified complication, without long-term current use of insulin (HCC) [name]'s Type 2 diabetes mellitus is well controlled. She has been on Ozempic 1 mg.  Lab Results  Component Value Date   HGBA1C 5.1 07/09/2020   HGBA1C 5.6 12/20/2019   HGBA1C 5.5 06/27/2019   Lab Results  Component Value Date   LDLCALC 67 07/09/2020   CREATININE 0.79 10/28/2020   Lab Results  Component Value Date   INSULIN 8.8 07/09/2020   INSULIN 8.7 12/20/2019   INSULIN 7.9 03/27/2019    4. Eating disorder/emotional eating [name] notes sweets cravings. She has 3 sodas per week. She denies history of nephrolithiasis.   Assessment/Plan:   1. Abnormal metabolism Marija will discontinue Phentermine.  2. Other  constipation Fernando was informed that a decrease in bowel movement frequency is normal while losing weight, but stools should not be hard or painful.  Corianna will increase dose of Linzess. Anquanette agrees to start new prescription of Linzess from 145 mcg to 290 mcg daily.  - linaclotide (LINZESS) 290 MCG CAPS capsule; Take 1 capsule (290 mcg total) by mouth daily before breakfast.  Dispense: 30 capsule; Refill: 0  3. Type 2 diabetes mellitus with other specified complication, without long-term current use of insulin (HCC) We will refill Ozempic 1 mg weekly.  - Semaglutide, 1 MG/DOSE, (OZEMPIC, 1 MG/DOSE,) 4 MG/3ML SOPN; Inject 1 mg into the skin once a week.  Dispense: 3 mL; Refill: 0  4. Eating disorder/emotional eating   [name] agrees to start new prescription of Topamax 25 mg every night at bedtime.    - topiramate (TOPAMAX) 25 MG tablet; Take 1 tablet (25 mg total) by mouth at bedtime.  Dispense: 30 tablet; Refill: 0  5. Obesity, current BMI 39.1 [name] is currently in the action stage of change. As such, her goal is to continue with weight loss efforts. She has agreed to practicing portion control and making smarter food choices, such as increasing vegetables and decreasing simple carbohydrates.   [name] will reduce protein shake intake to 1 per day.  She will work on having at least 2 meals per day with protein.  Category 2 plan was provided to give example of meals with protein.  Exercise goals:  As is.  Behavioral modification strategies: increasing lean protein intake, decreasing simple carbohydrates, and no  skipping meals.  Kristyanna has agreed to follow-up with our clinic in 4 weeks with Dr. Manson Passey.  Objective:   Blood pressure 107/75, pulse 97, temperature (!) 97.5 F (36.4 C), height 5\' 5"  (1.651 m), weight 235 lb (106.6 kg), SpO2 99 %. Body mass index is 39.11 kg/m.  General: Cooperative, alert, well developed, in no acute distress. HEENT: Conjunctivae and lids  unremarkable. Cardiovascular: Regular rhythm.  Lungs: Normal work of breathing. Neurologic: No focal deficits.   Lab Results  Component Value Date   CREATININE 0.79 10/28/2020   BUN 16 10/28/2020   NA 139 10/28/2020   K 4.0 10/28/2020   CL 102 10/28/2020   CO2 27 10/28/2020   Lab Results  Component Value Date   ALT 22 10/28/2020   AST 26 10/28/2020   ALKPHOS 44 10/28/2020   BILITOT 0.3 10/28/2020   Lab Results  Component Value Date   HGBA1C 5.1 07/09/2020   HGBA1C 5.6 12/20/2019   HGBA1C 5.5 06/27/2019   HGBA1C 5.9 (H) 03/27/2019   Lab Results  Component Value Date   INSULIN 8.8 07/09/2020   INSULIN 8.7 12/20/2019   INSULIN 7.9 03/27/2019   Lab Results  Component Value Date   TSH 3.120 07/09/2020   Lab Results  Component Value Date   CHOL 227 (H) 07/09/2020   HDL 142 07/09/2020   LDLCALC 67 07/09/2020   TRIG 92 10/25/2020   CHOLHDL 1.6 07/09/2020   Lab Results  Component Value Date   VD25OH 17.9 (L) 07/09/2020   VD25OH 22.5 (L) 12/20/2019   VD25OH 20.1 (L) 06/27/2019   Lab Results  Component Value Date   WBC 9.2 10/28/2020   HGB 10.7 (L) 10/28/2020   HCT 34.1 (L) 10/28/2020   MCV 91.2 10/28/2020   PLT 220 10/28/2020   Lab Results  Component Value Date   IRON 106 07/09/2020   TIBC 299 07/09/2020   FERRITIN 66 10/25/2020   Attestation Statements:   Reviewed by clinician on day of visit: allergies, medications, problem list, medical history, surgical history, family history, social history, and previous encounter notes.  I, 12/25/2020, RMA, am acting as [place] for Energy manager, FNP.   I have reviewed the above documentation for accuracy and completeness, and I agree with the above. -  [place], FNP

## 2021-05-07 ENCOUNTER — Telehealth (HOSPITAL_BASED_OUTPATIENT_CLINIC_OR_DEPARTMENT_OTHER): Payer: Medicare Other | Admitting: Psychiatry

## 2021-05-07 ENCOUNTER — Other Ambulatory Visit: Payer: Self-pay

## 2021-05-07 DIAGNOSIS — Z5181 Encounter for therapeutic drug level monitoring: Secondary | ICD-10-CM

## 2021-05-07 DIAGNOSIS — F411 Generalized anxiety disorder: Secondary | ICD-10-CM

## 2021-05-07 DIAGNOSIS — F4001 Agoraphobia with panic disorder: Secondary | ICD-10-CM

## 2021-05-07 DIAGNOSIS — G4700 Insomnia, unspecified: Secondary | ICD-10-CM | POA: Diagnosis not present

## 2021-05-07 DIAGNOSIS — F3181 Bipolar II disorder: Secondary | ICD-10-CM | POA: Diagnosis not present

## 2021-05-07 DIAGNOSIS — F5089 Other specified eating disorder: Secondary | ICD-10-CM

## 2021-05-07 MED ORDER — BUPROPION HCL ER (XL) 150 MG PO TB24: 450.0000 mg | ORAL_TABLET | Freq: Every day | ORAL | 0 refills | Status: DC

## 2021-05-07 MED ORDER — DOXEPIN HCL 150 MG PO CAPS: 150.0000 mg | ORAL_CAPSULE | Freq: Every day | ORAL | 0 refills | Status: DC

## 2021-05-07 MED ORDER — PRAZOSIN HCL 2 MG PO CAPS
2.0000 mg | ORAL_CAPSULE | Freq: Every day | ORAL | 0 refills | Status: DC
Start: 2021-05-07 — End: 2021-07-16

## 2021-05-07 MED ORDER — DIVALPROEX SODIUM ER 500 MG PO TB24: 1500.0000 mg | ORAL_TABLET | Freq: Every day | ORAL | 1 refills | Status: DC

## 2021-05-07 NOTE — Progress Notes (Signed)
Virtual Visit via Video Note  I connected with [name] on 05/07/21 at  9:15 AM EST by a video enabled telemedicine application and verified that I am speaking with the correct person using two identifiers.  Location: Patient: home Provider: office   I discussed the limitations of evaluation and management by telemedicine and the availability of in person appointments. The patient expressed understanding and agreed to proceed.  History of Present Illness: Antha has been having a migraine for the last 3 days. Her arthritis is acting up. Overall she is doing ok mood wise. Her sleep is good. Her anxiety has increased due to getting off the Klonopin. She will then pick up her dog and it helps. She has tolerated the increase in Depakote well. Her depression is ongoing issue. It comes and goes 1-2 days/week. She is not wanting to get up out of bed or go out as much. Her energy and motivation have been on the poor side most days. She also feels manic 1-2 days/week. On those days she has AVH and is more paranoid. She will accuse her boyfriend of cheating on her. She will hear a voice that are self derogatory. She thinks she see's someone in the hallway or someone in the kitchen. She denies impulsivity but is endorsing isolation..  Her appetite is poor and she is only drinking protien shakes and is working with her PCP. Her concentration is very poor. She denies SI/HI. She is having panic attacks 3x/week but is managing. She is down to 1 tab of Klonopin a day.    Observations/Objective: Psychiatric Specialty Exam: ROS  There were no vitals taken for this visit.There is no height or weight on file to calculate BMI.  General Appearance: Casual  Eye Contact:  Good  Speech:  Clear and Coherent and Normal Rate  Volume:  Normal  Mood:  Anxious and Depressed  Affect:  Full Range  Thought Process:  Goal Directed, Linear, and Descriptions of Associations: Intact  Orientation:  Full (Time, Place, and  Person)  Thought Content:  Logical  Suicidal Thoughts:  No  Homicidal Thoughts:  No  Memory:  Immediate;   Good  Judgement:  Good  Insight:  Good  Psychomotor Activity:  Normal  Concentration:  Concentration: Good  Recall:  Good  Fund of Knowledge:  Good  Language:  Good  Akathisia:  No  Handed:  Right  AIMS (if indicated):     Assets:  Communication Skills Desire for Improvement Financial Resources/Insurance Housing Talents/Skills Transportation Vocational/Educational  ADL's:  Intact  Cognition:  WNL  Sleep:        Assessment and Plan: Depression screen [facility] 2/9 05/07/2021 04/09/2021 11/20/2020 09/18/2020 03/27/2019  Decreased Interest 2 2 1 3 3   Down, Depressed, Hopeless 1 2 1 3 3   PHQ - 2 Score 3 4 2 6 6   Altered sleeping 0 2 1 3 3   Tired, decreased energy 3 3 3 3 3   Change in appetite 3 0 2 3 3   Feeling bad or failure about yourself  2 2 1 3 3   Trouble concentrating 3 2 1 3 3   Moving slowly or fidgety/restless 0 1 0 - 3  Suicidal thoughts 0 0 0 0 2  PHQ-9 Score 14 14 10 21 26   Difficult doing work/chores Very difficult Extremely dIfficult Very difficult Extremely dIfficult Extremely dIfficult  Some recent data might be hidden    Flowsheet Row Video Visit from 05/07/2021 in [facility] Video Visit from  04/09/2021 in [facility] Video Visit from 11/20/2020 in [facility]  C-SSRS RISK CATEGORY No Risk No Risk No Risk      1. Bipolar II disorder (HCC) - doxepin (SINEQUAN) 150 MG capsule; Take 1 capsule (150 mg total) by mouth at bedtime.  Dispense: 90 capsule; Refill: 0 - divalproex (DEPAKOTE ER) 500 MG 24 hr tablet; Take 3 tablets (1,500 mg total) by mouth daily.  Dispense: 90 tablet; Refill: 1  2. GAD (generalized anxiety disorder) - doxepin (SINEQUAN) 150 MG capsule; Take 1 capsule (150 mg total) by mouth at bedtime.  Dispense: 90 capsule;  Refill: 0  3. Panic disorder with agoraphobia - doxepin (SINEQUAN) 150 MG capsule; Take 1 capsule (150 mg total) by mouth at bedtime.  Dispense: 90 capsule; Refill: 0  4. Insomnia, unspecified type - prazosin (MINIPRESS) 2 MG capsule; Take 1 capsule (2 mg total) by mouth at bedtime.  Dispense: 90 capsule; Refill: 0  5. Other disorder of eating - buPROPion (WELLBUTRIN XL) 150 MG 24 hr tablet; Take 3 tablets (450 mg total) by mouth daily.  Dispense: 270 tablet; Refill: 0  6. Encounter for therapeutic drug monitoring - Valproic acid level - CBC - Comprehensive metabolic panel  - increase Doxepin 150mg  po qHS for depression and anxiety  Labs: ordered  Depakote level, CBC (platelets), CMP    Follow Up Instructions: In 2-3 months or sooner if needed   I discussed the assessment and treatment plan with the patient. The patient was provided an opportunity to ask questions and all were answered. The patient agreed with the plan and demonstrated an understanding of the instructions.   The patient was advised to call back or seek an in-person evaluation if the symptoms worsen or if the condition fails to improve as anticipated.  I provided 22 minutes of non-face-to-face time during this encounter.   , MD

## 2021-05-08 ENCOUNTER — Telehealth: Payer: Self-pay | Admitting: Neurology

## 2021-05-08 NOTE — Telephone Encounter (Signed)
Patient called after hours call center reporting a severe headache for 4 days.  I was able to talk to the patient.  She reports that she usually gets injections for her migraines (Botox, per chart review), but she had a severe headache for 4 days. In addition, she has a cough and has pain in the left arm as well as tingling in the left arm.  She was advised to get evaluated in the emergency room for new symptoms.  I verified that left arm pain and tingling are typically not associated symptoms with her typical migraines.  She is advised to have someone take her to the ER.  She reports that she would be able to call somebody to take her.  She demonstrated understanding and agreement with the plan.

## 2021-05-11 ENCOUNTER — Telehealth: Payer: Self-pay | Admitting: Neurology

## 2021-05-11 NOTE — Telephone Encounter (Signed)
LVM and sent mychart msg to see if pt needed to come in sooner to see one of the Nps. Held a slot for her 12/21 at 2:15 with Ihor Austin.

## 2021-05-11 NOTE — Telephone Encounter (Signed)
LVM and sent mychart msg to offer pt earlier f/u with NP per Dr. Lucia Gaskins.

## 2021-05-12 ENCOUNTER — Ambulatory Visit: Payer: Medicare Other | Admitting: Physical Therapy

## 2021-05-14 ENCOUNTER — Ambulatory Visit: Payer: Medicare Other | Admitting: Physical Therapy

## 2021-05-19 ENCOUNTER — Ambulatory Visit: Payer: Medicare Other | Admitting: Physical Therapy

## 2021-05-19 ENCOUNTER — Other Ambulatory Visit (INDEPENDENT_AMBULATORY_CARE_PROVIDER_SITE_OTHER): Payer: Self-pay | Admitting: Bariatrics

## 2021-05-19 ENCOUNTER — Encounter (INDEPENDENT_AMBULATORY_CARE_PROVIDER_SITE_OTHER): Payer: Self-pay

## 2021-05-19 DIAGNOSIS — R600 Localized edema: Secondary | ICD-10-CM

## 2021-05-19 NOTE — Telephone Encounter (Signed)
Msg sent to pt 

## 2021-05-21 ENCOUNTER — Ambulatory Visit: Payer: Medicare Other | Admitting: Physical Therapy

## 2021-05-26 ENCOUNTER — Ambulatory Visit: Payer: Medicare Other | Admitting: Physical Therapy

## 2021-05-28 ENCOUNTER — Ambulatory Visit (HOSPITAL_BASED_OUTPATIENT_CLINIC_OR_DEPARTMENT_OTHER): Payer: Medicare Other | Admitting: Physical Therapy

## 2021-05-28 ENCOUNTER — Other Ambulatory Visit (INDEPENDENT_AMBULATORY_CARE_PROVIDER_SITE_OTHER): Payer: Self-pay | Admitting: Family Medicine

## 2021-05-28 ENCOUNTER — Encounter: Payer: Medicare Other | Admitting: Physical Therapy

## 2021-05-28 DIAGNOSIS — F509 Eating disorder, unspecified: Secondary | ICD-10-CM

## 2021-06-02 ENCOUNTER — Ambulatory Visit (HOSPITAL_BASED_OUTPATIENT_CLINIC_OR_DEPARTMENT_OTHER): Payer: Medicare Other | Attending: Nurse Practitioner | Admitting: Physical Therapy

## 2021-06-02 ENCOUNTER — Other Ambulatory Visit: Payer: Self-pay

## 2021-06-02 ENCOUNTER — Encounter (HOSPITAL_BASED_OUTPATIENT_CLINIC_OR_DEPARTMENT_OTHER): Payer: Self-pay | Admitting: Physical Therapy

## 2021-06-02 DIAGNOSIS — R293 Abnormal posture: Secondary | ICD-10-CM | POA: Diagnosis not present

## 2021-06-02 DIAGNOSIS — M6281 Muscle weakness (generalized): Secondary | ICD-10-CM

## 2021-06-02 DIAGNOSIS — M25511 Pain in right shoulder: Secondary | ICD-10-CM

## 2021-06-02 DIAGNOSIS — M25611 Stiffness of right shoulder, not elsewhere classified: Secondary | ICD-10-CM | POA: Diagnosis not present

## 2021-06-02 NOTE — Therapy (Signed)
[facility] [address] [place], [place], [address] Phone: [phone]   Fax:  [phone]  Physical Therapy Treatment  Patient Details  Name: [name] MRN: [id number] Date of Birth: Oct 16, 1965 Referring Provider (PT): Eldridge Abrahams   Encounter Date: 06/02/2021   PT End of Session - 06/02/21 1621     Visit Number 3    Number of Visits 9    Date for PT Re-Evaluation 05/26/21    Authorization Type MCR and MCD    Authorization Time Period 04/27/21 to 05/26/21    Progress Note Due on Visit 10    PT Start Time 1620    PT Stop Time 1702    PT Time Calculation (min) 42 min    Activity Tolerance Patient tolerated treatment well    Behavior During Therapy [facility] for tasks assessed/performed             Past Medical History:  Diagnosis Date   Allergic rhinitis    Allergic to cats    pet dander   Allergy    Anemia    Anxiety    Arthritis 09/2018   both feet   Asthma    Back pain    Bipolar disorder ([place])    Cervicalgia    Chronic fatigue syndrome    Constipation    Depression    Diabetes mellitus without complication ([place])    "pre"   Dyspnea    Environmental allergies    Fibromyalgia    GERD (gastroesophageal reflux disease)    Grave's disease    Heart murmur    High cholesterol    History of blood clots    Hypertension    Joint pain    Lactose intolerance    Lower extremity edema    Multiple food allergies    OSA (obstructive sleep apnea)    Osteoporosis    Palpitations    Panic disorder    Rheumatoid arthritis ([place])    Risk for falls    Sciatica    Severe 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 ([place])    Sleep apnea    no cpap worn   Syncope    Syncope and collapse    Thyroid disease    Vasovagal syncope    Vertigo    Vitamin D deficiency     Past Surgical History:  Procedure Laterality Date   ABDOMINAL HYSTERECTOMY  2006   COLONOSCOPY     GASTRIC BYPASS  2008     There were no vitals filed for this visit.   Subjective Assessment - 06/02/21 1626     Subjective "took a muscle relaxer so my shoulder is feeling ok"    Currently in Pain? Yes    Pain Score 2     Pain Location Arm    Pain Orientation Right    Pain Descriptors / Indicators Discomfort;Aching;Spasm    Pain Type Acute pain    Pain Onset 1 to 4 weeks ago    Pain Frequency Constant                                          PT Short Term Goals - 04/27/21 1715       PT SHORT TERM GOAL #1   Title Will be independent with appropriate HEP, to be updated PRN    Time 2    Period Weeks  Status New    Target Date 05/11/21               PT Long Term Goals - 04/27/21 1716       PT LONG TERM GOAL #1   Title Pain will be no more than 7/10 at worst    Time 4    Period Weeks    Status New    Target Date 05/26/21      PT LONG TERM GOAL #2   Title Cervical and thoracic mobility to improve by at least 50% in order to reduce pain and help improve posture    Time 4    Period Weeks    Status New      PT LONG TERM GOAL #3   Title R shoulder ROM to be equal to that of the L in order to improve function    Time 4    Period Weeks    Status New      PT LONG TERM GOAL #4   Title MMT to have improved by at least 1 grade in all tested groups in order to assist in reducing pain and improving function    Time 4    Period Weeks    Status New            Pt seen for aquatic therapy today.  Treatment took place in water 3.25-4.8 ft in depth at the [facility] pool. Temp of water was 91.  Pt entered/exited the pool via stairs (step through pattern) independently with bilat rail.  Introduction to setting Walking 6 widths fwd and bwd in 3 ft Gentle shoulder ROM in 4 ft horizontal abd/add; flex; extension unresisted x10 Shoulder shrugging with gentle overpressure with depression Manual therapy to suboccipitals, SCM and upper trap Scapular  retraction x 10 Cervical ROM gentle flex, rotation and sidebending. Submerged to c-spine in 103 d added chin juts with gentle rotation, manual techniques    Pt requires buoyancy for support and to offload joints with strengthening exercises. Viscosity of the water is needed for resistance of strengthening; water current perturbations provides challenge to standing balance unsupported, requiring increased core activation.         Plan - 06/02/21 1700     Clinical Impression Statement Progressed on land treamtnent into water using buoyancy, hydrostatic pressure and theraputic warm  to encourage improved ROM in cervical spine and shoulders.  Pt does have some discomfort with movmeent but reports relaxation and decreased stiffness as well.  She tolerates session fairly well and will benefit from contirnued aquatic therapy to improve movement, decrease pain and hasten pt towards goals.             Patient will benefit from skilled therapeutic intervention in order to improve the following deficits and impairments:     Visit Diagnosis: Acute pain of right shoulder  Stiffness of right shoulder, not elsewhere classified  Abnormal posture  Muscle weakness (generalized)     Problem List Patient Active Problem List   Diagnosis Date Noted   Acute respiratory disease due to COVID-19 virus 10/25/2020   AKI (acute kidney injury) ([place]) 99991111   Metabolic syndrome 99991111   Constipation 07/09/2020   Subclinical hypothyroidism 05/11/2019   Prediabetes 05/11/2019   Vitamin D deficiency 05/11/2019   OSA (obstructive sleep apnea) 05/11/2019   Intractable chronic migraine without aura and with status migrainosus 01/14/2016   Risk for falls 09/02/2015   Insomnia 05/27/2015   Severe depressed bipolar II disorder without psychotic  features ([place]) 02/25/2015   Class 3 severe obesity with serious comorbidity and body mass index (BMI) of 40.0 to 44.9 in adult [facility]) 11/14/2014   Severe  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 ([place]) 10/10/2014   GAD (generalized anxiety disorder) 10/10/2014   Panic disorder with agoraphobia 10/10/2014   Social anxiety disorder 10/10/2014   Jaw pain-acute TMJ 09/24/14 09/24/2014   Asthma 08/01/2014   Low back pain 07/24/2014   Encounter for monitoring opioid maintenance therapy 07/02/2014   Pharyngoesophageal dysphagia 06/24/2014   S/P gastric bypass 06/24/2014   Protein-calorie malnutrition, severe ([place]) 11/03/2012   Drug overdose, multiple drugs 11/01/2012   Migraine 05/22/2012   Depression 11/22/2011   Chronic pain 11/22/2011   Graves' disease    GERD (gastroesophageal reflux disease)    Need for prophylactic postmenopausal hormone replacement therapy 11/03/2011   Allergic rhinitis 09/28/2011   Anemia 09/28/2011   Essential hypertension 09/28/2011    Annamarie Major) Abdoul Encinas MPT 06/02/2021, 6:24 PM  [facility] [address] [place], [place], [address] Phone: [phone]   Fax:  [phone]  Name: TESSY FEIDER MRN: [id number] Date of Birth: Mar 10, 1966

## 2021-06-03 ENCOUNTER — Encounter (INDEPENDENT_AMBULATORY_CARE_PROVIDER_SITE_OTHER): Payer: Self-pay | Admitting: Bariatrics

## 2021-06-03 ENCOUNTER — Telehealth (INDEPENDENT_AMBULATORY_CARE_PROVIDER_SITE_OTHER): Payer: Medicare Other | Admitting: Bariatrics

## 2021-06-03 DIAGNOSIS — E1169 Type 2 diabetes mellitus with other specified complication: Secondary | ICD-10-CM | POA: Diagnosis not present

## 2021-06-03 DIAGNOSIS — F5089 Other specified eating disorder: Secondary | ICD-10-CM

## 2021-06-03 DIAGNOSIS — E559 Vitamin D deficiency, unspecified: Secondary | ICD-10-CM

## 2021-06-03 DIAGNOSIS — Z6839 Body mass index (BMI) 39.0-39.9, adult: Secondary | ICD-10-CM

## 2021-06-03 MED ORDER — PHENTERMINE HCL 15 MG PO CAPS: 15.0000 mg | ORAL_CAPSULE | ORAL | 0 refills | Status: DC

## 2021-06-03 MED ORDER — TOPIRAMATE 25 MG PO TABS: 25.0000 mg | ORAL_TABLET | Freq: Every day | ORAL | 0 refills | Status: DC

## 2021-06-04 ENCOUNTER — Ambulatory Visit (HOSPITAL_BASED_OUTPATIENT_CLINIC_OR_DEPARTMENT_OTHER): Payer: Medicare Other | Admitting: Physical Therapy

## 2021-06-04 ENCOUNTER — Other Ambulatory Visit (HOSPITAL_COMMUNITY): Payer: Self-pay | Admitting: Psychiatry

## 2021-06-04 DIAGNOSIS — F3181 Bipolar II disorder: Secondary | ICD-10-CM

## 2021-06-04 NOTE — Progress Notes (Signed)
TeleHealth Visit:  Due to the COVID-19 pandemic, this visit was completed with telemedicine (audio/video) technology to reduce patient and provider exposure as well as to preserve personal protective equipment.   [name] has verbally consented to this TeleHealth visit. The patient is located at home, the provider is located at the [facility] office. The participants in this visit include the listed provider and patient. The visit was conducted today via video.  Chief Complaint: OBESITY [name] is here to discuss her progress with her obesity treatment plan along with follow-up of her obesity related diagnoses. [name] is on the Category 2 Plan and states she is following her eating plan approximately 0% of the time. [name] states she is doing 0 minutes 0 times per week.  Today's visit was #: 28 Starting weight: 250 lbs Starting date: 03/27/2019  Interim History: [name] states that she has been sick and requests a virtual visit. She states that she has lost 2 lbs since her last visit.   Subjective:   1. Type 2 diabetes mellitus with other specified complication, without long-term current use of insulin (HCC) [name] is currently taking Ozempic.  2. Vitamin D deficiency [name] is taking Vitamin D currently.   3. Other disorder of eating [name] is currently taking Wellbutrin/bupropion.   Assessment/Plan:   1. Type 2 diabetes mellitus with other specified complication, without long-term current use of insulin (HCC) [name] will continue her medications. Good blood sugar control is important to decrease the likelihood of diabetic complications such as nephropathy, neuropathy, limb loss, blindness, coronary artery disease, and death. Intensive lifestyle modification including diet, exercise and weight loss are the first line of treatment for diabetes.   2. Vitamin D deficiency Low Vitamin D level contributes to fatigue and are associated with obesity, breast, and  colon cancer. [name] will continue prescription Vitamin D and she will follow-up for routine testing of Vitamin D, at least 2-3 times per year to avoid over-replacement.  3. Other disorder of eating We will refill Topamax 25 mg for 1 month with no refills. Behavior modification techniques were discussed today to help [name] deal with her emotional/non-hunger eating behaviors.  Orders and follow up as documented in patient record.    - topiramate (TOPAMAX) 25 MG tablet; Take 1 tablet (25 mg total) by mouth at bedtime.  Dispense: 30 tablet; Refill: 0  4. Obesity, current BMI 39.11 [name] is currently in the action stage of change. As such, her goal is to continue with weight loss efforts. She has agreed to change to the Category 2 Plan.   [name] will continue meal planning and she will continue intentional eating. She will continue phentermine. She will increase water and protein. We will refill phentermine 15 mg for 1 month with no refills.  - phentermine 15 MG capsule; Take 1 capsule (15 mg total) by mouth every morning.  Dispense: 30 capsule; Refill: 0  Exercise goals:  [name] will start water aerobics and physical therapy.  Behavioral modification strategies: increasing lean protein intake, decreasing simple carbohydrates, increasing vegetables, increasing water intake, decreasing eating out, no skipping meals, meal planning and cooking strategies, keeping healthy foods in the home, and planning for success.  [name] has agreed to follow-up with our clinic in 3-4 weeks with Dr. Earlene Plater. She was informed of the importance of frequent follow-up visits to maximize her success with intensive lifestyle modifications for her multiple health conditions.  Objective:   VITALS: Per patient if applicable, see vitals. GENERAL: Alert and in  no acute distress. CARDIOPULMONARY: No increased WOB. Speaking in clear sentences.  PSYCH: Pleasant and cooperative. Speech normal rate and rhythm. Affect is  appropriate. Insight and judgement are appropriate. Attention is focused, linear, and appropriate.  NEURO: Oriented as arrived to appointment on time with no prompting.   Lab Results  Component Value Date   CREATININE 0.79 10/28/2020   BUN 16 10/28/2020   NA 139 10/28/2020   K 4.0 10/28/2020   CL 102 10/28/2020   CO2 27 10/28/2020   Lab Results  Component Value Date   ALT 22 10/28/2020   AST 26 10/28/2020   ALKPHOS 44 10/28/2020   BILITOT 0.3 10/28/2020   Lab Results  Component Value Date   HGBA1C 5.1 07/09/2020   HGBA1C 5.6 12/20/2019   HGBA1C 5.5 06/27/2019   HGBA1C 5.9 (H) 03/27/2019   Lab Results  Component Value Date   INSULIN 8.8 07/09/2020   INSULIN 8.7 12/20/2019   INSULIN 7.9 03/27/2019   Lab Results  Component Value Date   TSH 3.120 07/09/2020   Lab Results  Component Value Date   CHOL 227 (H) 07/09/2020   HDL 142 07/09/2020   LDLCALC 67 07/09/2020   TRIG 92 10/25/2020   CHOLHDL 1.6 07/09/2020   Lab Results  Component Value Date   VD25OH 17.9 (L) 07/09/2020   VD25OH 22.5 (L) 12/20/2019   VD25OH 20.1 (L) 06/27/2019   Lab Results  Component Value Date   WBC 9.2 10/28/2020   HGB 10.7 (L) 10/28/2020   HCT 34.1 (L) 10/28/2020   MCV 91.2 10/28/2020   PLT 220 10/28/2020   Lab Results  Component Value Date   IRON 106 07/09/2020   TIBC 299 07/09/2020   FERRITIN 66 10/25/2020    Attestation Statements:   Reviewed by clinician on day of visit: allergies, medications, problem list, medical history, surgical history, family history, social history, and previous encounter notes.   I, Lizbeth Bark, RMA, am acting as Location manager for [employer], DO.  I have reviewed the above documentation for accuracy and completeness, and I agree with the above. Jearld Lesch, DO

## 2021-06-07 ENCOUNTER — Encounter (INDEPENDENT_AMBULATORY_CARE_PROVIDER_SITE_OTHER): Payer: Self-pay | Admitting: Bariatrics

## 2021-06-08 ENCOUNTER — Ambulatory Visit: Payer: Medicare Other | Admitting: Physical Therapy

## 2021-06-10 ENCOUNTER — Ambulatory Visit (HOSPITAL_BASED_OUTPATIENT_CLINIC_OR_DEPARTMENT_OTHER): Payer: Medicare Other | Admitting: Physical Therapy

## 2021-06-10 ENCOUNTER — Other Ambulatory Visit: Payer: Self-pay

## 2021-06-10 ENCOUNTER — Encounter (HOSPITAL_BASED_OUTPATIENT_CLINIC_OR_DEPARTMENT_OTHER): Payer: Self-pay | Admitting: Physical Therapy

## 2021-06-10 DIAGNOSIS — M6281 Muscle weakness (generalized): Secondary | ICD-10-CM

## 2021-06-10 DIAGNOSIS — R293 Abnormal posture: Secondary | ICD-10-CM

## 2021-06-10 DIAGNOSIS — M25611 Stiffness of right shoulder, not elsewhere classified: Secondary | ICD-10-CM

## 2021-06-10 DIAGNOSIS — M25511 Pain in right shoulder: Secondary | ICD-10-CM | POA: Diagnosis not present

## 2021-06-10 NOTE — Therapy (Signed)
[facility] [address] [place], [place], [address] Phone: [phone]   Fax:  [phone]  Physical Therapy Treatment  Patient Details  Name: [name] MRN: [id number] Date of Birth: 08/03/1965 Referring Provider (PT): Eldridge Abrahams   Encounter Date: 06/10/2021   PT End of Session - 06/10/21 1506     Visit Number 4    Number of Visits 9    Date for PT Re-Evaluation 06/10/21    Authorization Type MCR and MCD    Authorization Time Period 06/02/21 to 06/10/2021    Progress Note Due on Visit 10    PT Start Time 1505    PT Stop Time 1545    PT Time Calculation (min) 40 min    Activity Tolerance Patient tolerated treatment well    Behavior During Therapy [facility] for tasks assessed/performed             Past Medical History:  Diagnosis Date   Allergic rhinitis    Allergic to cats    pet dander   Allergy    Anemia    Anxiety    Arthritis 09/2018   both feet   Asthma    Back pain    Bipolar disorder ([place])    Cervicalgia    Chronic fatigue syndrome    Constipation    Depression    Diabetes mellitus without complication ([place])    "pre"   Dyspnea    Environmental allergies    Fibromyalgia    GERD (gastroesophageal reflux disease)    Grave's disease    Heart murmur    High cholesterol    History of blood clots    Hypertension    Joint pain    Lactose intolerance    Lower extremity edema    Multiple food allergies    OSA (obstructive sleep apnea)    Osteoporosis    Palpitations    Panic disorder    Rheumatoid arthritis ([place])    Risk for falls    Sciatica    Severe 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 ([place])    Sleep apnea    no cpap worn   Syncope    Syncope and collapse    Thyroid disease    Vasovagal syncope    Vertigo    Vitamin D deficiency     Past Surgical History:  Procedure Laterality Date   ABDOMINAL HYSTERECTOMY  2006   COLONOSCOPY     GASTRIC BYPASS  2008     There were no vitals filed for this visit.   Subjective Assessment - 06/10/21 1507     Subjective I fell out of my garden tub again after I put a grab bar in place, didn't hold.  Am working on getting a walk in shower.    Pain Score 7     Pain Location Arm    Pain Orientation Right    Pain Descriptors / Indicators Aching;Discomfort    Pain Type Acute pain    Pain Onset 1 to 4 weeks ago    Pain Frequency Constant                                          PT Short Term Goals - 04/27/21 1715       PT SHORT TERM GOAL #1   Title Will be independent with appropriate HEP, to be updated PRN  Time 2    Period Weeks    Status New    Target Date 05/11/21               PT Long Term Goals - 04/27/21 1716       PT LONG TERM GOAL #1   Title Pain will be no more than 7/10 at worst    Time 4    Period Weeks    Status New    Target Date 05/26/21      PT LONG TERM GOAL #2   Title Cervical and thoracic mobility to improve by at least 50% in order to reduce pain and help improve posture    Time 4    Period Weeks    Status New      PT LONG TERM GOAL #3   Title R shoulder ROM to be equal to that of the L in order to improve function    Time 4    Period Weeks    Status New      PT LONG TERM GOAL #4   Title MMT to have improved by at least 1 grade in all tested groups in order to assist in reducing pain and improving function    Time 4    Period Weeks    Status New             Pt seen for aquatic therapy today.  Treatment took place in water 3.25-4.8 ft in depth at the [facility] pool. Temp of water was 91.  Pt entered/exited the pool via stairs (step through pattern) independently with bilat rail.   Introduction to setting Walking 6 widths each fwd, sidestepping and bwd in 3 ft Gentle shoulder ROM in 4 ft horizontal abd/add; flex; extension unresisted x10 Manual therapy to suboccipitals, SCM and upper trap Cervical ROM  gentle flex, rotation and sidebending.  Seated:flutter at hip; add/abd 3x12   Pt requires buoyancy for support and to offload joints with strengthening exercises. Viscosity of the water is needed for resistance of strengthening; water current perturbations provides challenge to standing balance unsupported, requiring increased core activation.        Plan - 06/10/21 1703     Clinical Impression Statement Pt reports fall from bath tub (again).  She has increased pain throughout body (fibromyalgia) as well as shoulder and c-spine.  Some slight bruising on hip.  Focus today is to improve ROM and decrease pain with gentle stretching and strengthening. Completed ue exercises unloaded.  Pt also arrives with new referral from MD for LBP.  She tolerates aquatic session fair.  States she felt very good after last treatment lasting a day or two.                                                  Re-certification: objective measures will be done by primary therapist at next land visit    Stability/Clinical Decision Making Evolving/Moderate complexity    Clinical Decision Making Moderate    Rehab Potential Fair    PT Frequency 2x / week    PT Duration 4 weeks    PT Treatment/Interventions ADLs/Self Care Home Management;Cryotherapy;Electrical Stimulation;Iontophoresis 4mg /ml Dexamethasone;Moist Heat;Ultrasound;Gait training;Balance training;Therapeutic exercise;Therapeutic activities;Functional mobility training;Stair training;Patient/family education;Manual techniques;Vasopneumatic Device;Taping;Passive range of motion;Dry needling    PT Next Visit Plan Advance strengtheing as tolerted    PT Home  Exercise Plan will aquatic HEP             Patient will benefit from skilled therapeutic intervention in order to improve the following deficits and impairments:  Decreased range of motion, Increased fascial restricitons, Increased muscle spasms, Impaired UE functional use, Decreased activity tolerance, Pain,  Impaired flexibility, Decreased strength, Postural dysfunction, Decreased mobility  Visit Diagnosis: Acute pain of right shoulder - Plan: PT plan of care cert/re-cert  Stiffness of right shoulder, not elsewhere classified - Plan: PT plan of care cert/re-cert  Abnormal posture - Plan: PT plan of care cert/re-cert  Muscle weakness (generalized) - Plan: PT plan of care cert/re-cert     Problem List Patient Active Problem List   Diagnosis Date Noted   Acute respiratory disease due to COVID-19 virus 10/25/2020   AKI (acute kidney injury) ([place]) 99991111   Metabolic syndrome 99991111   Constipation 07/09/2020   Subclinical hypothyroidism 05/11/2019   Prediabetes 05/11/2019   Vitamin D deficiency 05/11/2019   OSA (obstructive sleep apnea) 05/11/2019   Intractable chronic migraine without aura and with status migrainosus 01/14/2016   Risk for falls 09/02/2015   Insomnia 05/27/2015   Severe depressed bipolar II disorder without psychotic features ([place]) 02/25/2015   Class 3 severe obesity with serious comorbidity and body mass index (BMI) of 40.0 to 44.9 in adult ([place]) 11/14/2014   Severe 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 ([place]) 10/10/2014   GAD (generalized anxiety disorder) 10/10/2014   Panic disorder with agoraphobia 10/10/2014   Social anxiety disorder 10/10/2014   Jaw pain-acute TMJ 09/24/14 09/24/2014   Asthma 08/01/2014   Low back pain 07/24/2014   Encounter for monitoring opioid maintenance therapy 07/02/2014   Pharyngoesophageal dysphagia 06/24/2014   S/P gastric bypass 06/24/2014   Protein-calorie malnutrition, severe ([place]) 11/03/2012   Drug overdose, multiple drugs 11/01/2012   Migraine 05/22/2012   Depression 11/22/2011   Chronic pain 11/22/2011   Graves' disease    GERD (gastroesophageal reflux disease)    Need for prophylactic postmenopausal hormone replacement therapy 11/03/2011   Allergic rhinitis 09/28/2011   Anemia 09/28/2011    Essential hypertension 09/28/2011    Annamarie Major) Gerturde Kuba MPT 06/10/2021, 5:23 PM  [facility] [address] [place], [place], [address] Phone: [phone]   Fax:  [phone]  Name: [name] MRN: [id number] Date of Birth: 01-27-1966

## 2021-06-12 ENCOUNTER — Other Ambulatory Visit (INDEPENDENT_AMBULATORY_CARE_PROVIDER_SITE_OTHER): Payer: Self-pay | Admitting: Bariatrics

## 2021-06-12 DIAGNOSIS — R600 Localized edema: Secondary | ICD-10-CM

## 2021-06-15 ENCOUNTER — Encounter (INDEPENDENT_AMBULATORY_CARE_PROVIDER_SITE_OTHER): Payer: Self-pay

## 2021-06-15 NOTE — Telephone Encounter (Signed)
Dr.Wallace °

## 2021-06-15 NOTE — Telephone Encounter (Signed)
Msg sent to pt 

## 2021-06-18 ENCOUNTER — Encounter: Payer: Self-pay | Admitting: Physical Therapy

## 2021-06-18 ENCOUNTER — Ambulatory Visit: Payer: Medicare Other | Attending: Nurse Practitioner | Admitting: Physical Therapy

## 2021-06-18 ENCOUNTER — Other Ambulatory Visit: Payer: Self-pay

## 2021-06-18 DIAGNOSIS — M6281 Muscle weakness (generalized): Secondary | ICD-10-CM | POA: Insufficient documentation

## 2021-06-18 DIAGNOSIS — M25511 Pain in right shoulder: Secondary | ICD-10-CM | POA: Diagnosis not present

## 2021-06-18 DIAGNOSIS — R293 Abnormal posture: Secondary | ICD-10-CM | POA: Diagnosis present

## 2021-06-18 DIAGNOSIS — M25611 Stiffness of right shoulder, not elsewhere classified: Secondary | ICD-10-CM | POA: Insufficient documentation

## 2021-06-18 NOTE — Therapy (Signed)
[place] [place], [place], [address] Phone: [phone]   Fax:  [phone]  Physical Therapy Treatment  Patient Details  Name: [name] MRN: [id number] Date of Birth: December 15, 1965 Referring Provider (PT): Eldridge Abrahams   Encounter Date: 06/18/2021  Progress Note Reporting Period 04/27/21 to 06/18/21  See note below for Objective Data and Assessment of Progress/Goals.       PT End of Session - 06/18/21 1208     Visit Number 5    Number of Visits 14    Date for PT Re-Evaluation 08/20/21    Authorization Type MCR and MCD    Authorization Time Period 06/02/21 to 06/10/2021; extended to 3/30    Progress Note Due on Visit 15    PT Start Time 1102    PT Stop Time 1143    PT Time Calculation (min) 41 min    Activity Tolerance Patient tolerated treatment well    Behavior During Therapy [facility] for tasks assessed/performed             Past Medical History:  Diagnosis Date   Allergic rhinitis    Allergic to cats    pet dander   Allergy    Anemia    Anxiety    Arthritis 09/2018   both feet   Asthma    Back pain    Bipolar disorder ([place])    Cervicalgia    Chronic fatigue syndrome    Constipation    Depression    Diabetes mellitus without complication ([place])    "pre"   Dyspnea    Environmental allergies    Fibromyalgia    GERD (gastroesophageal reflux disease)    Grave's disease    Heart murmur    High cholesterol    History of blood clots    Hypertension    Joint pain    Lactose intolerance    Lower extremity edema    Multiple food allergies    OSA (obstructive sleep apnea)    Osteoporosis    Palpitations    Panic disorder    Rheumatoid arthritis ([place])    Risk for falls    Sciatica    Severe 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 ([place])    Sleep apnea    no cpap worn   Syncope    Syncope and collapse    Thyroid disease    Vasovagal syncope    Vertigo     Vitamin D deficiency     Past Surgical History:  Procedure Laterality Date   ABDOMINAL HYSTERECTOMY  2006   COLONOSCOPY     GASTRIC BYPASS  2008    There were no vitals filed for this visit.   Subjective Assessment - 06/18/21 1103     Subjective I'm having a lot of pain today, I think it may be related to the pain. I had that fall recently in my garden tub, I had one of those suction cup grab bars and hit my head pretty hard. I have had a vision change but I'm really not sure if its related to hitting my head. The water therapy has been going great. I have less pain after water therapy and have to take less medication, I feel like my mobility is improving after working in the water.    Patient Stated Goals get rid of pain    Currently in Pain? Yes    Pain Score 8     Pain Location Arm  Pain Orientation Right    Pain Descriptors / Indicators Stabbing;Numbness;Other (Comment);Spasm   "like crap"   Pain Type Chronic pain    Pain Onset More than a month ago    Pain Frequency Intermittent    Aggravating Factors  cooking, walking if arm is swinging    Pain Relieving Factors holding R arm across chest (almost as if it were in a sling)    Effect of Pain on Daily Activities varies                [facility] PT Assessment - 06/18/21 0001       Observation/Other Assessments   Focus on Therapeutic Outcomes (FOTO)  45      AROM   Right Shoulder Flexion 87 Degrees    Right Shoulder ABduction 90 Degrees    Right Shoulder Internal Rotation --   FIR L1   Right Shoulder External Rotation --   only able to touch top of head   Left Shoulder Flexion 140 Degrees    Left Shoulder ABduction 130 Degrees    Left Shoulder Internal Rotation --   FIR T4   Left Shoulder External Rotation --   only able to touch top of head   Cervical Flexion WNL    Cervical Extension 20% limited    Cervical - Right Side Bend 50% limited    Cervical - Left Side Bend 50% limited    Cervical - Right Rotation 70%  lmited    Cervical - Left Rotation WNL    Thoracic Flexion severe limitation    Thoracic Extension severe limitation    Thoracic - Right Side Bend severe limitation    Thoracic - Left Side Bend severe limitation    Thoracic - Right Rotation severe limitation    Thoracic - Left Rotation severe limitation      Strength   Right Shoulder Flexion 3+/5    Right Shoulder ABduction 3-/5    Right Shoulder Internal Rotation 3-/5    Right Shoulder External Rotation 3-/5    Left Shoulder Flexion 4+/5    Left Shoulder ABduction 4/5    Left Shoulder Internal Rotation 4+/5    Left Shoulder External Rotation 3+/5    Right Elbow Flexion 4+/5    Right Elbow Extension 3-/5    Left Elbow Flexion 4+/5    Left Elbow Extension 4/5      Palpation   Palpation comment still quite tender, no significant change since eval                                    PT Education - 06/18/21 1206     Education provided Yes    Education Details reassess findings, POC moving forward; importance of HEP compliance as well as regular check ins with land therapy to make sure gains are carrying over; pain scale; use of TB during HEP; interconnectedness of c-spine, t-spine, and shoulder    Person(s) Educated Patient    Methods Explanation    Comprehension Verbalized understanding;Returned demonstration              PT Short Term Goals - 06/18/21 1130       PT SHORT TERM GOAL #1   Title Will be independent with appropriate HEP, to be updated PRN    Baseline 1/26- 3x/week due to holidays, planning wedding after holidays, planning birthdays, etc    Time 2    Period Weeks  Status On-going               PT Long Term Goals - 06/18/21 1132       PT LONG TERM GOAL #1   Title Pain will be no more than 7/10 at worst    Baseline 1/26- 8/10    Time 4    Period Weeks    Status On-going      PT LONG TERM GOAL #2   Title Cervical and thoracic mobility to improve by at least 50% in  order to reduce pain and help improve posture    Baseline 1/26- still quite stiff    Time 4    Period Weeks    Status On-going      PT LONG TERM GOAL #3   Title R shoulder ROM to be equal to that of the L in order to improve function    Baseline 1/26- improved but still quite stiff    Time 4    Period Weeks    Status On-going      PT LONG TERM GOAL #4   Title MMT to have improved by at least 1 grade in all tested groups in order to assist in reducing pain and improving function    Baseline 1/26- some better/some worse    Time 4    Period Weeks    Status On-going                   Plan - 06/18/21 1209     Clinical Impression Statement Ms. Dilaura arrives today in quite a bit of pain, but does tell me she feels that water therapy is helping. She feels quite a bit better the day after, but has had ongoing pain. Objective measurements show mixed outcomes- some measures have improved and some are the same or worse; per chart review it looks like she has only had 3 treatment sessions. I do question if some of her pain may be driven by psychosocial factors based on some inconsistent measures and statements she made today. Had multiple reasons for me as to why she has not been consistent with HEP. At this point, lets go ahead and try some more water PT sessions- I did educate her that if she does not show significant objective progress at next reassessment we will need to discharge however.    Personal Factors and Comorbidities Age;Fitness;Behavior Pattern;Past/Current Experience;Comorbidity 3+;Time since onset of injury/illness/exacerbation    Examination-Activity Limitations Bathing;Bed Mobility;Reach Overhead;Caring for Others;Sleep;Carry;Dressing;Hygiene/Grooming;Lift;Toileting    Examination-Participation Restrictions Meal Prep;Cleaning;Occupation;Community Activity;Driving;Medication Management;Yard Work;Laundry    Stability/Clinical Decision Making Evolving/Moderate complexity     Clinical Decision Making Moderate    Rehab Potential Fair    PT Frequency 2x / week   + reassess on 3/30   PT Duration 4 weeks   + reassess on 3/30   PT Treatment/Interventions ADLs/Self Care Home Management;Cryotherapy;Electrical Stimulation;Iontophoresis 4mg /ml Dexamethasone;Moist Heat;Ultrasound;Gait training;Balance training;Therapeutic exercise;Therapeutic activities;Functional mobility training;Stair training;Patient/family education;Manual techniques;Vasopneumatic Device;Taping;Passive range of motion;Dry needling    PT Next Visit Plan Advance strengtheing as tolerted    PT Home Exercise Plan will aquatic HEP    Consulted and Agree with Plan of Care Patient             Patient will benefit from skilled therapeutic intervention in order to improve the following deficits and impairments:  Decreased range of motion, Increased fascial restricitons, Increased muscle spasms, Impaired UE functional use, Decreased activity tolerance, Pain, Impaired flexibility, Decreased strength, Postural dysfunction, Decreased mobility  Visit Diagnosis: Acute pain of right shoulder  Stiffness of right shoulder, not elsewhere classified  Abnormal posture  Muscle weakness (generalized)     Problem List Patient Active Problem List   Diagnosis Date Noted   Acute respiratory disease due to COVID-19 virus 10/25/2020   AKI (acute kidney injury) ([place]) 99991111   Metabolic syndrome 99991111   Constipation 07/09/2020   Subclinical hypothyroidism 05/11/2019   Prediabetes 05/11/2019   Vitamin D deficiency 05/11/2019   OSA (obstructive sleep apnea) 05/11/2019   Intractable chronic migraine without aura and with status migrainosus 01/14/2016   Risk for falls 09/02/2015   Insomnia 05/27/2015   Severe depressed bipolar II disorder without psychotic features ([place]) 02/25/2015   Class 3 severe obesity with serious comorbidity and body mass index (BMI) of 40.0 to 44.9 in adult ([place]) 11/14/2014   Severe  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 ([place]) 10/10/2014   GAD (generalized anxiety disorder) 10/10/2014   Panic disorder with agoraphobia 10/10/2014   Social anxiety disorder 10/10/2014   Jaw pain-acute TMJ 09/24/14 09/24/2014   Asthma 08/01/2014   Low back pain 07/24/2014   Encounter for monitoring opioid maintenance therapy 07/02/2014   Pharyngoesophageal dysphagia 06/24/2014   S/P gastric bypass 06/24/2014   Protein-calorie malnutrition, severe ([place]) 11/03/2012   Drug overdose, multiple drugs 11/01/2012   Migraine 05/22/2012   Depression 11/22/2011   Chronic pain 11/22/2011   Graves' disease    GERD (gastroesophageal reflux disease)    Need for prophylactic postmenopausal hormone replacement therapy 11/03/2011   Allergic rhinitis 09/28/2011   Anemia 09/28/2011   Essential hypertension 09/28/2011   Ann Lions PT, DPT, PN2   Supplemental Physical Therapist [place]    Pager [phone] Acute Rehab Office [place]. [place], [place], [address] Phone: [phone]   Fax:  [phone]  Name: [name] MRN: [id number] Date of Birth: 1965/11/05

## 2021-06-22 ENCOUNTER — Other Ambulatory Visit: Payer: Self-pay

## 2021-06-22 ENCOUNTER — Emergency Department (HOSPITAL_COMMUNITY): Payer: Medicare Other

## 2021-06-22 ENCOUNTER — Observation Stay (HOSPITAL_COMMUNITY)
Admission: EM | Admit: 2021-06-22 | Discharge: 2021-06-24 | Disposition: A | Discharge status: Home / Self Care | Payer: Medicare Other | Attending: Family Medicine | Admitting: Family Medicine

## 2021-06-22 ENCOUNTER — Encounter (HOSPITAL_COMMUNITY): Payer: Self-pay | Admitting: *Deleted

## 2021-06-22 DIAGNOSIS — K219 Gastro-esophageal reflux disease without esophagitis: Secondary | ICD-10-CM | POA: Diagnosis present

## 2021-06-22 DIAGNOSIS — H538 Other visual disturbances: Secondary | ICD-10-CM | POA: Diagnosis not present

## 2021-06-22 DIAGNOSIS — F4001 Agoraphobia with panic disorder: Secondary | ICD-10-CM

## 2021-06-22 DIAGNOSIS — R519 Headache, unspecified: Secondary | ICD-10-CM | POA: Diagnosis not present

## 2021-06-22 DIAGNOSIS — J45909 Unspecified asthma, uncomplicated: Secondary | ICD-10-CM | POA: Diagnosis not present

## 2021-06-22 DIAGNOSIS — E441 Mild protein-calorie malnutrition: Secondary | ICD-10-CM | POA: Diagnosis not present

## 2021-06-22 DIAGNOSIS — F3181 Bipolar II disorder: Secondary | ICD-10-CM

## 2021-06-22 DIAGNOSIS — Z7982 Long term (current) use of aspirin: Secondary | ICD-10-CM | POA: Insufficient documentation

## 2021-06-22 DIAGNOSIS — Z20822 Contact with and (suspected) exposure to covid-19: Secondary | ICD-10-CM | POA: Insufficient documentation

## 2021-06-22 DIAGNOSIS — Z91040 Latex allergy status: Secondary | ICD-10-CM | POA: Diagnosis not present

## 2021-06-22 DIAGNOSIS — R2990 Unspecified symptoms and signs involving the nervous system: Secondary | ICD-10-CM | POA: Diagnosis present

## 2021-06-22 DIAGNOSIS — R296 Repeated falls: Secondary | ICD-10-CM | POA: Insufficient documentation

## 2021-06-22 DIAGNOSIS — R4781 Slurred speech: Principal | ICD-10-CM | POA: Insufficient documentation

## 2021-06-22 DIAGNOSIS — E119 Type 2 diabetes mellitus without complications: Secondary | ICD-10-CM | POA: Insufficient documentation

## 2021-06-22 DIAGNOSIS — E876 Hypokalemia: Secondary | ICD-10-CM | POA: Diagnosis not present

## 2021-06-22 DIAGNOSIS — Z794 Long term (current) use of insulin: Secondary | ICD-10-CM | POA: Insufficient documentation

## 2021-06-22 DIAGNOSIS — F411 Generalized anxiety disorder: Secondary | ICD-10-CM

## 2021-06-22 DIAGNOSIS — G4733 Obstructive sleep apnea (adult) (pediatric): Secondary | ICD-10-CM | POA: Diagnosis present

## 2021-06-22 DIAGNOSIS — R4182 Altered mental status, unspecified: Secondary | ICD-10-CM | POA: Insufficient documentation

## 2021-06-22 DIAGNOSIS — I10 Essential (primary) hypertension: Secondary | ICD-10-CM | POA: Diagnosis present

## 2021-06-22 LAB — CBC
HCT: 37.8 % (ref 36.0–46.0)
Hemoglobin: 12 g/dL (ref 12.0–15.0)
MCH: 29.5 pg (ref 26.0–34.0)
MCHC: 31.7 g/dL (ref 30.0–36.0)
MCV: 92.9 fL (ref 80.0–100.0)
Platelets: 272 10*3/uL (ref 150–400)
RBC: 4.07 MIL/uL (ref 3.87–5.11)
RDW: 14.5 % (ref 11.5–15.5)
WBC: 7.5 10*3/uL (ref 4.0–10.5)
nRBC: 0 % (ref 0.0–0.2)

## 2021-06-22 LAB — DIFFERENTIAL
Abs Immature Granulocytes: 0.02 10*3/uL (ref 0.00–0.07)
Basophils Absolute: 0 10*3/uL (ref 0.0–0.1)
Basophils Relative: 0 %
Eosinophils Absolute: 0.2 10*3/uL (ref 0.0–0.5)
Eosinophils Relative: 2 %
Immature Granulocytes: 0 %
Lymphocytes Relative: 25 %
Lymphs Abs: 1.9 10*3/uL (ref 0.7–4.0)
Monocytes Absolute: 1 10*3/uL (ref 0.1–1.0)
Monocytes Relative: 14 %
Neutro Abs: 4.4 10*3/uL (ref 1.7–7.7)
Neutrophils Relative %: 59 %

## 2021-06-22 LAB — APTT: aPTT: 28 seconds (ref 24–36)

## 2021-06-22 LAB — I-STAT BETA HCG BLOOD, ED (MC, WL, AP ONLY): I-stat hCG, quantitative: 5.9 m[IU]/mL — ABNORMAL HIGH (ref <5)

## 2021-06-22 LAB — PROTIME-INR
INR: 0.9 (ref 0.8–1.2)
Prothrombin Time: 12.4 seconds (ref 11.4–15.2)

## 2021-06-22 MED ORDER — SODIUM CHLORIDE 0.9% FLUSH
3.0000 mL | Freq: Once | INTRAVENOUS | Status: AC
  Administered 2021-06-23: 3 mL via INTRAVENOUS

## 2021-06-22 NOTE — ED Triage Notes (Addendum)
Pt states for 3 days her family has been telling her that when she talks fast it sounds like she is slurring her speech. No facial droop. Decreased sensation right face on sensory exam. Headache worse over the past 1.5 days. Also says that she has been dizzy and fallen 4 times today. Says she has been seeing people that are not there for about 2 weeks.

## 2021-06-23 ENCOUNTER — Encounter (HOSPITAL_COMMUNITY): Payer: Self-pay | Admitting: Family Medicine

## 2021-06-23 ENCOUNTER — Observation Stay (HOSPITAL_COMMUNITY): Payer: Medicare Other

## 2021-06-23 DIAGNOSIS — R519 Headache, unspecified: Secondary | ICD-10-CM | POA: Diagnosis not present

## 2021-06-23 DIAGNOSIS — E441 Mild protein-calorie malnutrition: Secondary | ICD-10-CM

## 2021-06-23 DIAGNOSIS — Z91040 Latex allergy status: Secondary | ICD-10-CM | POA: Diagnosis not present

## 2021-06-23 DIAGNOSIS — Z794 Long term (current) use of insulin: Secondary | ICD-10-CM | POA: Diagnosis not present

## 2021-06-23 DIAGNOSIS — R296 Repeated falls: Secondary | ICD-10-CM | POA: Diagnosis not present

## 2021-06-23 DIAGNOSIS — Z7982 Long term (current) use of aspirin: Secondary | ICD-10-CM | POA: Diagnosis not present

## 2021-06-23 DIAGNOSIS — R2990 Unspecified symptoms and signs involving the nervous system: Secondary | ICD-10-CM

## 2021-06-23 DIAGNOSIS — E119 Type 2 diabetes mellitus without complications: Secondary | ICD-10-CM | POA: Diagnosis not present

## 2021-06-23 DIAGNOSIS — E876 Hypokalemia: Secondary | ICD-10-CM | POA: Diagnosis not present

## 2021-06-23 DIAGNOSIS — H538 Other visual disturbances: Secondary | ICD-10-CM | POA: Diagnosis not present

## 2021-06-23 DIAGNOSIS — J45909 Unspecified asthma, uncomplicated: Secondary | ICD-10-CM | POA: Diagnosis not present

## 2021-06-23 DIAGNOSIS — R4182 Altered mental status, unspecified: Secondary | ICD-10-CM | POA: Diagnosis not present

## 2021-06-23 DIAGNOSIS — R4781 Slurred speech: Secondary | ICD-10-CM | POA: Diagnosis present

## 2021-06-23 DIAGNOSIS — Z20822 Contact with and (suspected) exposure to covid-19: Secondary | ICD-10-CM | POA: Diagnosis not present

## 2021-06-23 DIAGNOSIS — I10 Essential (primary) hypertension: Secondary | ICD-10-CM | POA: Diagnosis not present

## 2021-06-23 HISTORY — DX: Mild protein-calorie malnutrition: E44.1

## 2021-06-23 LAB — VALPROIC ACID LEVEL: Valproic Acid Lvl: <10 ug/mL — ABNORMAL LOW (ref 50.0–100.0)

## 2021-06-23 LAB — CBG MONITORING, ED: Glucose-Capillary: 100 mg/dL — ABNORMAL HIGH (ref 70–99)

## 2021-06-23 LAB — COMPREHENSIVE METABOLIC PANEL
ALT: 14 U/L (ref 0–44)
AST: 22 U/L (ref 15–41)
Albumin: 3.3 g/dL — ABNORMAL LOW (ref 3.5–5.0)
Alkaline Phosphatase: 52 U/L (ref 38–126)
Anion gap: 7 (ref 5–15)
BUN: 21 mg/dL — ABNORMAL HIGH (ref 6–20)
CO2: 22 mmol/L (ref 22–32)
Calcium: 8.4 mg/dL — ABNORMAL LOW (ref 8.9–10.3)
Chloride: 110 mmol/L (ref 98–111)
Creatinine, Ser: 0.88 mg/dL (ref 0.44–1.00)
GFR, Estimated: >60 mL/min (ref >60)
Glucose, Bld: 99 mg/dL (ref 70–99)
Potassium: 3.4 mmol/L — ABNORMAL LOW (ref 3.5–5.1)
Sodium: 139 mmol/L (ref 135–145)
Total Bilirubin: 0.6 mg/dL (ref 0.3–1.2)
Total Protein: 6.3 g/dL — ABNORMAL LOW (ref 6.5–8.1)

## 2021-06-23 LAB — RAPID URINE DRUG SCREEN, HOSP PERFORMED
Amphetamines: NOT DETECTED
Barbiturates: NOT DETECTED
Benzodiazepines: NOT DETECTED
Cocaine: NOT DETECTED
Opiates: NOT DETECTED
Tetrahydrocannabinol: NOT DETECTED

## 2021-06-23 LAB — ETHANOL: Alcohol, Ethyl (B): <10 mg/dL (ref <10)

## 2021-06-23 LAB — TSH: TSH: 4.415 u[IU]/mL (ref 0.350–4.500)

## 2021-06-23 LAB — RESP PANEL BY RT-PCR (FLU A&B, COVID) ARPGX2
Influenza A by PCR: NEGATIVE
Influenza B by PCR: NEGATIVE
SARS Coronavirus 2 by RT PCR: NEGATIVE

## 2021-06-23 LAB — AMMONIA: Ammonia: 22 umol/L (ref 9–35)

## 2021-06-23 LAB — VITAMIN B12: Vitamin B-12: 3187 pg/mL — ABNORMAL HIGH (ref 180–914)

## 2021-06-23 LAB — C-REACTIVE PROTEIN: CRP: <0.5 mg/dL (ref <1.0)

## 2021-06-23 LAB — FOLATE: Folate: 22.3 ng/mL (ref >5.9)

## 2021-06-23 LAB — SEDIMENTATION RATE: Sed Rate: 30 mm/hr — ABNORMAL HIGH (ref 0–22)

## 2021-06-23 MED ORDER — ALBUTEROL SULFATE (2.5 MG/3ML) 0.083% IN NEBU: 3.0000 mL | INHALATION_SOLUTION | Freq: Four times a day (QID) | RESPIRATORY_TRACT | Status: DC | PRN

## 2021-06-23 MED ORDER — LORAZEPAM 2 MG/ML IJ SOLN
1.0000 mg | Freq: Once | INTRAMUSCULAR | Status: AC | PRN
  Administered 2021-06-23: 1 mg via INTRAVENOUS
  Filled 2021-06-23: qty 1

## 2021-06-23 MED ORDER — ONDANSETRON HCL 4 MG PO TABS: 4.0000 mg | ORAL_TABLET | Freq: Four times a day (QID) | ORAL | Status: DC | PRN

## 2021-06-23 MED ORDER — ENOXAPARIN SODIUM 40 MG/0.4ML IJ SOSY
40.0000 mg | PREFILLED_SYRINGE | INTRAMUSCULAR | Status: DC
  Administered 2021-06-23: 40 mg via SUBCUTANEOUS
  Filled 2021-06-23: qty 0.4

## 2021-06-23 MED ORDER — ACETAMINOPHEN 325 MG PO TABS: 650.0000 mg | ORAL_TABLET | Freq: Four times a day (QID) | ORAL | Status: DC | PRN

## 2021-06-23 MED ORDER — ACETAMINOPHEN 650 MG RE SUPP: 650.0000 mg | Freq: Four times a day (QID) | RECTAL | Status: DC | PRN

## 2021-06-23 MED ORDER — ONDANSETRON HCL 4 MG/2ML IJ SOLN: 4.0000 mg | Freq: Four times a day (QID) | INTRAMUSCULAR | Status: DC | PRN
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
## 2021-06-23 NOTE — Consult Note (Signed)
[facility] TeleNeurology Consult Services  Stat Consult  Patient Name:   [name], [name] Date of Birth:   1966-02-14 Identification Number:   MRN - [id number] Date of Service:   06/23/2021 01:53:47  Diagnosis:       R47.81 - Slurred speech  Impression 56 year old female with Bipolar disorder who 3 days ago started having progressive generalized weakness, frequent falls, hallucinations and difficulty with speech. Some of this was reported by family who were not present when I met with her. The patient feels her vision is blurry (not diplopia). She states she has had syncope that predate these problems. She denies history of stroke, seizures or LOC. On exam, she stutters but does not show lateralizing signs. CT head negative.  She will need to be worked up for potential neurologic causes including CVA and seizure (especially given unexplained syncopal episodes) as noted below. Ultimately, these problems may be a manifestation of her pyschiatric disease and medication effect.  I spoke to the ED.  Our recommendations are outlined below.  Diagnostic Studies: MRI brain w/wo contrast Routine EEG  Laboratory Studies (if not done): Check B12/folate TSH Ammonia CMP CBC with diff ESR/CRP HIV Toxic metabolic work up per primary team  DVT Prophylaxis: Choice of Primary Team  Disposition: Neurology will follow   Metrics: [facility] Notification Time: 06/23/2021 01:51:59 Stamp Time: 06/23/2021 01:53:47 Callback Response Time: 06/23/2021 01:54:17   ----------------------------------------------------------------------------------------------------  Chief Complaint: slurred speech  History of Present Illness: Patient is a 56 year old Female. 56 year old female with Bipolar disorder who 3 days ago started having progressive generalized weakness, frequent falls, hallucinations and difficulty with speech. Some of this history was reported by family who were  not present when I met with her.  The patient feels her vision is blurry (not diplopia). She states she has had syncope that predate these problems. She denies history of stroke, seizures or LOC.  She does not think she took her medications incorrectly. She denies dose changes recently.  There is no history of stroke or seizure.    Past Medical History:      Hyperlipidemia      There is no history of Hypertension      There is no history of Diabetes Mellitus      There is no history of Stroke      There is no history of Seizures  Medications:  No Anticoagulant use  Antiplatelet use: Yes Aspirin  Allergies:  Reviewed in EHR    Social History: Smoking: No Alcohol Use: No Drug Use: No  Family History:  There is no family history of premature cerebrovascular disease pertinent to this consultation  ROS : A complete pertinent Review of Systems was performed and was negative except mentioned in HPI.      Examination: BP(133/78), Pulse(83), Blood Glucose(100)  Neuro Exam: General: Alert,Awake, Oriented to Time, Place, Person  Speech: Dysarthric: She stuttered but was understandable  Language: Intact:  Face: Symmetric:  Facial Sensation: Intact:  Extraocular Movements: Intact:  Motor Exam: No Drift:  Coordination: Intact:     Patient / Family was informed the Neurology Consult would occur via TeleHealth consult by way of interactive audio and video telecommunications and consented to receiving care in this manner.  Patient is being evaluated for possible acute neurologic impairment and high probability of imminent or life - threatening deterioration.I spent total of 30 minutes providing care to this patient, including time for face to face visit via telemedicine, review of medical records, imaging studies and discussion  of findings with providers, the patient and / or family.   Dr Albertha Ghee   [facility] [phone]  Case [id number]

## 2021-06-23 NOTE — ED Notes (Signed)
Teleneurology consult called

## 2021-06-23 NOTE — ED Notes (Signed)
Patient asked this nurse to speak with security that she feels she dropped her phone outside near her car when she came in. States it is an Android with a purple case. Spoke with security and states they have not had one turned in but will follow up. Patient updated and aware.

## 2021-06-23 NOTE — ED Notes (Signed)
ED TO INPATIENT HANDOFF REPORT  Name/Age/Gender [name] 56 y.o. female  Code Status    Code Status Orders  (From admission, onward)           Start     Ordered   06/23/21 1325  Full code  Continuous        06/23/21 1327           Code Status History     Date Active Date Inactive Code Status Order ID Comments User Context   10/25/2020 2228 10/28/2020 1851 Full Code [id number]  Vianne Bulls, MD ED   03/21/2018 2039 03/23/2018 1903 Full Code [id number]  Cristy Folks, MD Inpatient   11/01/2012 2227 11/04/2012 1648 Full Code [id number]  Charlynne Cousins, MD Inpatient       Home/SNF/Other Home  Chief Complaint Multiple neurological symptoms [R29.90]  Level of Care/Admitting Diagnosis ED Disposition     ED Disposition  Admit   Condition  --   [facility] Area: [place] [[id number]]  Level of Care: Telemetry Medical [104]  May place patient in observation at [facility] or [place] if equivalent level of care is available:: No  Covid Evaluation: Asymptomatic Screening Protocol (No Symptoms)  Diagnosis: Multiple neurological symptoms [id number]  Admitting Physician: Vianne Bulls [id number]  Attending Physician: Vianne Bulls [id number]          Medical History Past Medical History:  Diagnosis Date   Allergic rhinitis    Allergic to cats    pet dander   Allergy    Anemia    Anxiety    Arthritis 09/2018   both feet   Asthma    Back pain    Bipolar disorder ([place])    Cervicalgia    Chronic fatigue syndrome    Constipation    Depression    Diabetes mellitus without complication ([place])    "pre"   Dyspnea    Environmental allergies    Fibromyalgia    GERD (gastroesophageal reflux disease)    Grave's disease    Heart murmur    High cholesterol    History of blood clots    Hypertension    Joint pain    Lactose intolerance    Lower extremity edema    Multiple food allergies    OSA (obstructive sleep apnea)     Osteoporosis    Palpitations    Panic disorder    Rheumatoid arthritis ([place])    Risk for falls    Sciatica    Severe 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 ([place])    Sleep apnea    no cpap worn   Syncope    Syncope and collapse    Thyroid disease    Vasovagal syncope    Vertigo    Vitamin D deficiency     Allergies Allergies  Allergen Reactions   Bee Venom Anaphylaxis    Other reaction(s): PRURITUS, RASH   Coconut Fatty Acids Anaphylaxis    Patient allergic to coconut in general   Mushroom Ext Cmplx(Shiitake-Reishi-Mait) Anaphylaxis   Nutritional Supplements Anaphylaxis and Itching    walnuts   Other Anaphylaxis, Hives and Itching    Ragweed   Shellfish Allergy Anaphylaxis   Strawberry Extract Anaphylaxis   Hydrocodone-Acetaminophen Itching   Latex    Hydrocodone-Acetaminophen Rash    Other reaction(s): Unknown Other reaction(s): Other (See Comments)    IV Location/Drains/Wounds Patient Lines/Drains/Airways Status     Active Line/Drains/Airways  Name Placement date Placement time Site Days   Peripheral IV 06/23/21 20 G Left Antecubital 06/23/21  0300  Antecubital  less than 1            Labs/Imaging Results for orders placed or performed during the hospital encounter of 06/22/21 (from the past 48 hour(s))  Protime-INR     Status: None   Collection Time: 06/22/21 11:01 PM  Result Value Ref Range   Prothrombin Time 12.4 11.4 - 15.2 seconds   INR 0.9 0.8 - 1.2    Comment: (NOTE) INR goal varies based on device and disease states. Performed at [facility], [place] [address] [place], [place] [address]   APTT     Status: None   Collection Time: 06/22/21 11:01 PM  Result Value Ref Range   aPTT 28 24 - 36 seconds    Comment: Performed at [facility], [place] [address] [place], [place] [address]  CBC     Status: None   Collection Time: 06/22/21 11:01 PM  Result Value Ref Range   WBC 7.5 4.0 -  10.5 K/uL   RBC 4.07 3.87 - 5.11 MIL/uL   Hemoglobin 12.0 12.0 - 15.0 g/dL   HCT 37.8 36.0 - 46.0 %   MCV 92.9 80.0 - 100.0 fL   MCH 29.5 26.0 - 34.0 pg   MCHC 31.7 30.0 - 36.0 g/dL   RDW 14.5 11.5 - 15.5 %   Platelets 272 150 - 400 K/uL   nRBC 0.0 0.0 - 0.2 %    Comment: Performed at [facility], [place] [address], [place], [place] [address]  Differential     Status: None   Collection Time: 06/22/21 11:01 PM  Result Value Ref Range   Neutrophils Relative % 59 %   Neutro Abs 4.4 1.7 - 7.7 K/uL   Lymphocytes Relative 25 %   Lymphs Abs 1.9 0.7 - 4.0 K/uL   Monocytes Relative 14 %   Monocytes Absolute 1.0 0.1 - 1.0 K/uL   Eosinophils Relative 2 %   Eosinophils Absolute 0.2 0.0 - 0.5 K/uL   Basophils Relative 0 %   Basophils Absolute 0.0 0.0 - 0.1 K/uL   Immature Granulocytes 0 %   Abs Immature Granulocytes 0.02 0.00 - 0.07 K/uL    Comment: Performed at [facility], [place] [address], [place], [place] [address]  I-Stat beta hCG blood, ED     Status: Abnormal   Collection Time: 06/22/21 11:18 PM  Result Value Ref Range   I-stat hCG, quantitative 5.9 (H) <5 mIU/mL   Comment 3            Comment:   GEST. AGE      CONC.  (mIU/mL)   <=1 WEEK        5 - 50     2 WEEKS       50 - 500     3 WEEKS       100 - 10,000     4 WEEKS     1,000 - 30,000        FEMALE AND NON-PREGNANT FEMALE:     LESS THAN 5 mIU/mL   Valproic acid level     Status: Abnormal   Collection Time: 06/23/21 12:39 AM  Result Value Ref Range   Valproic Acid Lvl <10 (L) 50.0 - 100.0 ug/mL    Comment: Performed at [facility], [place] [address] [place],  [address]  Ethanol  Status: None   Collection Time: 06/23/21 12:39 AM  Result Value Ref Range   Alcohol, Ethyl (B) <10 <10 mg/dL    Comment: (NOTE) Lowest detectable limit for serum alcohol is 10 mg/dL.  For medical purposes only. Performed at [facility], [place] [address], [place], [place] [address]   Comprehensive metabolic panel     Status: Abnormal   Collection Time: 06/23/21 12:43 AM  Result Value Ref Range   Sodium 139 135 - 145 mmol/L   Potassium 3.4 (L) 3.5 - 5.1 mmol/L   Chloride 110 98 - 111 mmol/L   CO2 22 22 - 32 mmol/L   Glucose, Bld 99 70 - 99 mg/dL    Comment: Glucose reference range applies only to samples taken after fasting for at least 8 hours.   BUN 21 (H) 6 - 20 mg/dL   Creatinine, Ser 0.88 0.44 - 1.00 mg/dL   Calcium 8.4 (L) 8.9 - 10.3 mg/dL   Total Protein 6.3 (L) 6.5 - 8.1 g/dL   Albumin 3.3 (L) 3.5 - 5.0 g/dL   AST 22 15 - 41 U/L   ALT 14 0 - 44 U/L   Alkaline Phosphatase 52 38 - 126 U/L   Total Bilirubin 0.6 0.3 - 1.2 mg/dL   GFR, Estimated >60 >60 mL/min    Comment: (NOTE) Calculated using the CKD-EPI Creatinine Equation (2021)    Anion gap 7 5 - 15    Comment: Performed at [facility], [place] [address] [place], [place] [address]  CBG monitoring, ED     Status: Abnormal   Collection Time: 06/23/21  1:56 AM  Result Value Ref Range   Glucose-Capillary 100 (H) 70 - 99 mg/dL    Comment: Glucose reference range applies only to samples taken after fasting for at least 8 hours.  Resp Panel by RT-PCR (Flu A&B, Covid) Nasopharyngeal Swab     Status: None   Collection Time: 06/23/21  2:55 AM   Specimen: Nasopharyngeal Swab; Nasopharyngeal(NP) swabs in vial transport medium  Result Value Ref Range   SARS Coronavirus 2 by RT PCR NEGATIVE NEGATIVE    Comment: (NOTE) SARS-CoV-2 target nucleic acids are NOT DETECTED.  The SARS-CoV-2 RNA is generally detectable in upper respiratory specimens during the acute phase of infection. The lowest concentration of SARS-CoV-2 viral copies this assay can detect is 138 copies/mL. A negative result does not preclude SARS-Cov-2 infection and should not be used as the sole basis for treatment or other patient management decisions. A negative result may occur with  improper  specimen collection/handling, submission of specimen other than nasopharyngeal swab, presence of viral mutation(s) within the areas targeted by this assay, and inadequate number of viral copies(<138 copies/mL). A negative result must be combined with clinical observations, patient history, and epidemiological information. The expected result is Negative.  Fact Sheet for Patients:  EntrepreneurPulse.com.au  Fact Sheet for Healthcare Providers:  IncredibleEmployment.be  This test is no t yet approved or cleared by the [place] FDA and  has been authorized for detection and/or diagnosis of SARS-CoV-2 by FDA under an Emergency Use Authorization (EUA). This EUA will remain  in effect (meaning this test can be used) for the duration of the COVID-19 declaration under Section 564(b)(1) of the Act, 21 U.S.C.section 360bbb-3(b)(1), unless the authorization is terminated  or revoked sooner.       Influenza A by PCR NEGATIVE NEGATIVE   Influenza B by PCR NEGATIVE NEGATIVE    Comment: (NOTE) The  Xpert Xpress SARS-CoV-2/FLU/RSV plus assay is intended as an aid in the diagnosis of influenza from Nasopharyngeal swab specimens and should not be used as a sole basis for treatment. Nasal washings and aspirates are unacceptable for Xpert Xpress SARS-CoV-2/FLU/RSV testing.  Fact Sheet for Patients: EntrepreneurPulse.com.au  Fact Sheet for Healthcare Providers: IncredibleEmployment.be  This test is not yet approved or cleared by the [place] FDA and has been authorized for detection and/or diagnosis of SARS-CoV-2 by FDA under an Emergency Use Authorization (EUA). This EUA will remain in effect (meaning this test can be used) for the duration of the COVID-19 declaration under Section 564(b)(1) of the Act, 21 U.S.C. section 360bbb-3(b)(1), unless the authorization is terminated or revoked.  Performed at [facility], [place] [address] [place], [place] [address]   Rapid urine drug screen (hospital performed)     Status: None   Collection Time: 06/23/21  7:36 AM  Result Value Ref Range   Opiates NONE DETECTED NONE DETECTED   Cocaine NONE DETECTED NONE DETECTED   Benzodiazepines NONE DETECTED NONE DETECTED   Amphetamines NONE DETECTED NONE DETECTED   Tetrahydrocannabinol NONE DETECTED NONE DETECTED   Barbiturates NONE DETECTED NONE DETECTED    Comment: (NOTE) DRUG SCREEN FOR MEDICAL PURPOSES ONLY.  IF CONFIRMATION IS NEEDED FOR ANY PURPOSE, NOTIFY LAB WITHIN 5 DAYS.  LOWEST DETECTABLE LIMITS FOR URINE DRUG SCREEN Drug Class                     Cutoff (ng/mL) Amphetamine and metabolites    1000 Barbiturate and metabolites    200 Benzodiazepine                 [id number] Tricyclics and metabolites     300 Opiates and metabolites        300 Cocaine and metabolites        300 THC                            50 Performed at [facility], [place] [address], [place], [place] [address]   Ammonia     Status: None   Collection Time: 06/23/21  9:03 AM  Result Value Ref Range   Ammonia 22 9 - 35 umol/L    Comment: Performed at [facility], [place] [address], [place], [place] [address]  Sedimentation rate     Status: Abnormal   Collection Time: 06/23/21  9:03 AM  Result Value Ref Range   Sed Rate 30 (H) 0 - 22 mm/hr    Comment: Performed at [facility], [place] [address], [place], [place] [address]  C-reactive protein     Status: None   Collection Time: 06/23/21  9:03 AM  Result Value Ref Range   CRP <0.5 <1.0 mg/dL    Comment: Performed at [facility], [place] [address] [place], [place] [address]  TSH     Status: None   Collection Time: 06/23/21  9:03 AM  Result Value Ref Range   TSH 4.415 0.350 - 4.500 uIU/mL    Comment: Performed by a 3rd Generation assay with a functional sensitivity of <=0.01 uIU/mL. Performed at [facility], [place] [address] [place], [place] [address]   Vitamin B12     Status: Abnormal   Collection Time: 06/23/21  9:03 AM  Result Value Ref Range   Vitamin B-12 3,187 (H) 180 - 914 pg/mL  Comment: RESULTS CONFIRMED BY MANUAL DILUTION (NOTE) This assay is not validated for testing neonatal or myeloproliferative syndrome specimens for Vitamin B12 levels. Performed at [facility], [place] [address] [place], [place] [address]   Folate     Status: None   Collection Time: 06/23/21  9:03 AM  Result Value Ref Range   Folate 22.3 >5.9 ng/mL    Comment: Performed at [facility], [place] [address], [place], [place] [address]   CT HEAD WO CONTRAST  Result Date: 06/22/2021 CLINICAL DATA:  Slurred speech, headache EXAM: CT HEAD WITHOUT CONTRAST TECHNIQUE: Contiguous axial images were obtained from the base of the skull through the vertex without intravenous contrast. RADIATION DOSE REDUCTION: This exam was performed according to the departmental dose-optimization program which includes automated exposure control, adjustment of the mA and/or kV according to patient size and/or use of iterative reconstruction technique. COMPARISON:  11/12/2019 FINDINGS: Brain: No evidence of acute infarction, hemorrhage, hydrocephalus, extra-axial collection or mass lesion/mass effect. Vascular: No hyperdense vessel or unexpected calcification. Skull: Normal. Negative for fracture or focal lesion. Sinuses/Orbits: The visualized paranasal sinuses are essentially clear. The mastoid air cells are unopacified. Other: None. IMPRESSION: Normal head CT. Electronically Signed   By: Julian Hy M.D.   On: 06/22/2021 23:20   MR BRAIN WO CONTRAST  Result Date: 06/23/2021 CLINICAL DATA:  Bipolar disorder, 3 days ago started having progressive generalized weakness, falls, hallucinations, speech difficulties, confusion EXAM: MRI HEAD WITHOUT CONTRAST TECHNIQUE: Multiplanar,  multiecho pulse sequences of the brain and surrounding structures were obtained without intravenous contrast. COMPARISON:  CT head dated 1 day prior, MR head 03/21/2018 FINDINGS: Brain: There is no evidence of acute intracranial hemorrhage, extra-axial fluid collection, or acute infarct. Parenchymal volume is normal. The ventricles are normal in size. There are scattered small foci of FLAIR signal abnormality in the subcortical and periventricular white matter, nonspecific but likely reflecting sequela of mild chronic white matter microangiopathy, not significantly changed since 2019. There is no suspicious parenchymal signal abnormality. There is no mass lesion. There is no midline shift. Vascular: Normal flow voids. Skull and upper cervical spine: Normal marrow signal. Sinuses/Orbits: The paranasal sinuses are clear. The globes and orbits are unremarkable. Other: None. IMPRESSION: Unremarkable brain MRI. Electronically Signed   By: Valetta Mole M.D.   On: 06/23/2021 10:53    Pending Labs Unresulted Labs (From admission, onward)     Start     Ordered   06/30/21 0500  Creatinine, serum  (enoxaparin (LOVENOX)    CrCl >/= 30 ml/min)  Weekly,   R     Comments: while on enoxaparin therapy    06/23/21 1327   06/24/21 0500  Comprehensive metabolic panel  Tomorrow morning,   R        06/23/21 1327            Vitals/Pain Today's Vitals   06/23/21 0900 06/23/21 1233 06/23/21 1544 06/23/21 1637  BP: (!) 112/56 119/71 106/76 111/66  Pulse: 80 83 84 84  Resp: 14 16 16 18   Temp:      TempSrc:      SpO2: 98% 99% 100% 100%  PainSc: 0-No pain       Isolation Precautions No active isolations  Medications Medications  enoxaparin (LOVENOX) injection 40 mg (has no administration in time range)  acetaminophen (TYLENOL) tablet 650 mg (has no administration in time range)    Or  acetaminophen (TYLENOL) suppository 650 mg (has no administration in time range)  ondansetron (ZOFRAN) tablet 4 mg (has no  administration in time range)    Or  ondansetron (ZOFRAN) injection 4 mg (has no administration in time range)  sodium chloride flush (NS) 0.9 % injection 3 mL (3 mLs Intravenous Given 06/23/21 0454)  LORazepam (ATIVAN) injection 1 mg (1 mg Intravenous Given 06/23/21 0946)    Mobility walks

## 2021-06-23 NOTE — ED Provider Notes (Signed)
[facility] DEPT Provider Note   CSN: [id number] Arrival date & time: 06/22/21  2223     History  Chief Complaint  Patient presents with   Aphasia    ERSULA [name] is a 56 y.o. female.  The history is provided by the patient.  Weakness Severity:  Moderate Onset quality:  Gradual Duration:  3 days Timing:  Constant Progression:  Worsening Chronicity:  New Context: not change in medication   Relieved by:  Rest Worsened by:  Activity Associated symptoms: headaches   Associated symptoms: no chest pain and no fever   Patient with extensive history including chronic fatigue, bipolar disorder presents with generalized weakness and difficulty speaking.  Patient reports for least 3 days she has been having frequent falls.  Denies any syncope, but reports dizziness.  Family also reports that her speech has been slurred.  No focal weakness has been reported.  She does report headaches and visual changes.  She also told nursing that she may be hallucinating.  No new medications.    Past Medical History:  Diagnosis Date   Allergic rhinitis    Allergic to cats    pet dander   Allergy    Anemia    Anxiety    Arthritis 09/2018   both feet   Asthma    Back pain    Bipolar disorder (HCC)    Cervicalgia    Chronic fatigue syndrome    Constipation    Depression    Diabetes mellitus without complication ([place])    "pre"   Dyspnea    Environmental allergies    Fibromyalgia    GERD (gastroesophageal reflux disease)    Grave's disease    Heart murmur    High cholesterol    History of blood clots    Hypertension    Joint pain    Lactose intolerance    Lower extremity edema    Multiple food allergies    OSA (obstructive sleep apnea)    Osteoporosis    Palpitations    Panic disorder    Rheumatoid arthritis ([place])    Risk for falls    Sciatica    Severe 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 ([place])    Sleep apnea    no  cpap worn   Syncope    Syncope and collapse    Thyroid disease    Vasovagal syncope    Vertigo    Vitamin D deficiency      Home Medications Prior to Admission medications   Medication Sig Start Date End Date Taking? Authorizing Provider  albuterol (VENTOLIN HFA) 108 (90 Base) MCG/ACT inhaler Inhale 2 puffs into the lungs every 6 (six) hours as needed for wheezing. 12/20/19   Whitmire, Joneen Boers, FNP  aspirin EC 81 MG tablet Take 81 mg by mouth daily. Swallow whole.    [provider]  azelastine (ASTELIN) 0.1 % nasal spray Place into both nostrils 2 (two) times daily. Use in each nostril as directed    [provider]  budesonide-formoterol (SYMBICORT) 80-4.5 MCG/ACT inhaler Inhale 2 puffs into the lungs 2 (two) times daily as needed (wheezing/sob).     [provider]  buPROPion (WELLBUTRIN XL) 150 MG 24 hr tablet Take 3 tablets (450 mg total) by mouth daily. 05/07/21   Charlcie Cradle, MD  Cetirizine HCl 10 MG CAPS Take 1 capsule by mouth daily as needed. 08/08/19   [provider]  clonazePAM (KLONOPIN) 0.5 MG tablet Take 1  tablet (0.5 mg total) by mouth 2 (two) times daily as needed for 14 days for anxiety, THEN 1 tablet (0.5 mg total) daily as needed for up to 14 days for anxiety. 04/09/21 05/07/21  Charlcie Cradle, MD  diclofenac sodium (VOLTAREN) 1 % GEL Apply 2 g topically 4 (four) times daily. 12/12/18   Trula Slade, DPM  divalproex (DEPAKOTE ER) 500 MG 24 hr tablet Take 3 tablets (1,500 mg total) by mouth daily. 05/07/21   Charlcie Cradle, MD  doxepin (SINEQUAN) 150 MG capsule Take 1 capsule (150 mg total) by mouth at bedtime. 05/07/21   Charlcie Cradle, MD  estradiol (ESTRACE) 0.5 MG tablet Take 0.5 mg by mouth daily. 07/20/19   [provider]  fluticasone (FLONASE) 50 MCG/ACT nasal spray PLACE 1 SPRAY INTO BOTH NOSTRILS AS NEEDED (ALLERGIES/RUNNY NOSE). 10/22/20   Whitmire, Dawn W, FNP  Galcanezumab-gnlm (EMGALITY) 120 MG/ML SOAJ  INJECT 120 MG INTO THE SKIN EVERY 30 DAYS 04/21/21   Melvenia Beam, MD  hydrochlorothiazide (MICROZIDE) 12.5 MG capsule TAKE 1 CAPSULE (12.5 MG TOTAL) BY MOUTH DAILY AS NEEDED (EDEMA). 04/20/21   Georgia Lopes, DO  linaclotide Rolan Lipa) 290 MCG CAPS capsule Take 1 capsule (290 mcg total) by mouth daily before breakfast. 05/05/21   Whitmire, Joneen Boers, FNP  meloxicam (MOBIC) 15 MG tablet Take 15 mg by mouth daily. 06/17/20   [provider]  MOVANTIK 25 MG TABS tablet Take 25 mg by mouth daily. 07/27/19   [provider]  Multiple Vitamin (MULTIVITAMIN) tablet Take 1 tablet by mouth daily.    [provider]  phentermine 15 MG capsule Take 1 capsule (15 mg total) by mouth every morning. 06/03/21   Jearld Lesch A, DO  prazosin (MINIPRESS) 2 MG capsule Take 1 capsule (2 mg total) by mouth at bedtime. 05/07/21   Charlcie Cradle, MD  Semaglutide, 1 MG/DOSE, (OZEMPIC, 1 MG/DOSE,) 4 MG/3ML SOPN Inject 1 mg into the skin once a week. 05/05/21   Whitmire, Joneen Boers, FNP  tapentadol HCl (NUCYNTA) 75 MG tablet Take 75 mg by mouth every 8 (eight) hours as needed.    [provider]  Thiamine HCl (VITAMIN B-1 PO) Take 1 tablet by mouth daily.     [provider]  topiramate (TOPAMAX) 25 MG tablet Take 1 tablet (25 mg total) by mouth at bedtime. 06/03/21   Georgia Lopes, DO  Vitamin D, Ergocalciferol, (DRISDOL) 1.25 MG (50000 UNIT) CAPS capsule Take 1 capsule (50,000 Units total) by mouth every 7 (seven) days. 04/07/21   Jearld Lesch A, DO      Allergies    Bee venom, Coconut fatty acids, Mushroom ext cmplx(shiitake-reishi-mait), Nutritional supplements, Other, Shellfish allergy, Strawberry extract, Hydrocodone-acetaminophen, and Latex    Review of Systems   Review of Systems  Constitutional:  Negative for fever.  Eyes:  Positive for visual disturbance.  Cardiovascular:  Negative for chest pain.  Neurological:  Positive for speech difficulty, weakness and headaches.  Negative for syncope.  All other systems reviewed and are negative.  Physical Exam Updated Vital Signs BP 126/82 (BP Location: Right Arm)    Pulse 80    Temp 98.5 F (36.9 C) (Oral)    Resp 11    SpO2 99%  Physical Exam CONSTITUTIONAL: Well developed/well nourished HEAD: Normocephalic/atraumatic EYES: EOMI/PERRL, no nystagmus, no ptosis ENMT: Mucous membranes moist NECK: supple no meningeal signs CV: S1/S2 noted, no murmurs/rubs/gallops noted LUNGS: Lungs are clear to auscultation bilaterally, no apparent distress ABDOMEN: soft, nontender,  no rebound or guarding GU:no cva tenderness NEURO:Awake/alert, face symmetric, no arm or leg drift is noted Equal 5/5 strength with shoulder abduction, elbow flex/extension, wrist flex/extension in upper extremities and equal hand grips bilaterally Equal  strength with hip flexion,knee flex/extension, foot dorsi/plantar flexion Cranial nerves 3/4/5/6/11/29/08/11/12 tested and intact Speech is slow and deliberate.  It is garbled at times. She answers all questions appropriately No sensory deficit EXTREMITIES: pulses normal, full ROM SKIN: warm, color normal PSYCH: Flat affect   ED Results / Procedures / Treatments   Labs (all labs ordered are listed, but only abnormal results are displayed) Labs Reviewed  VALPROIC ACID LEVEL - Abnormal; Notable for the following components:      Result Value   Valproic Acid Lvl <10 (*)    All other components within normal limits  COMPREHENSIVE METABOLIC PANEL - Abnormal; Notable for the following components:   Potassium 3.4 (*)    BUN 21 (*)    Calcium 8.4 (*)    Total Protein 6.3 (*)    Albumin 3.3 (*)    All other components within normal limits  CBG MONITORING, ED - Abnormal; Notable for the following components:   Glucose-Capillary 100 (*)    All other components within normal limits  I-STAT BETA HCG BLOOD, ED ([facility], [facility], [facility] ONLY) - Abnormal; Notable for the following components:   I-stat hCG,  quantitative 5.9 (*)    All other components within normal limits  RESP PANEL BY RT-PCR (FLU A&B, COVID) ARPGX2  PROTIME-INR  APTT  CBC  DIFFERENTIAL  ETHANOL  RAPID URINE DRUG SCREEN, HOSP PERFORMED    EKG EKG Interpretation  Date/Time:  Monday June 22 2021 22:47:07 EST Ventricular Rate:  86 PR Interval:  191 QRS Duration: 96 QT Interval:  395 QTC Calculation: 473 R Axis:   -10 Text Interpretation: Sinus rhythm Left ventricular hypertrophy Confirmed by Ripley Fraise [phone]) on 06/22/2021 11:09:12 PM  Radiology CT HEAD WO CONTRAST  Result Date: 06/22/2021 CLINICAL DATA:  Slurred speech, headache EXAM: CT HEAD WITHOUT CONTRAST TECHNIQUE: Contiguous axial images were obtained from the base of the skull through the vertex without intravenous contrast. RADIATION DOSE REDUCTION: This exam was performed according to the departmental dose-optimization program which includes automated exposure control, adjustment of the mA and/or kV according to patient size and/or use of iterative reconstruction technique. COMPARISON:  11/12/2019 FINDINGS: Brain: No evidence of acute infarction, hemorrhage, hydrocephalus, extra-axial collection or mass lesion/mass effect. Vascular: No hyperdense vessel or unexpected calcification. Skull: Normal. Negative for fracture or focal lesion. Sinuses/Orbits: The visualized paranasal sinuses are essentially clear. The mastoid air cells are unopacified. Other: None. IMPRESSION: Normal head CT. Electronically Signed   By: Julian Hy M.D.   On: 06/22/2021 23:20    Procedures Procedures    Medications Ordered in ED Medications  sodium chloride flush (NS) 0.9 % injection 3 mL (has no administration in time range)    ED Course/ Medical Decision Making/ A&P Clinical Course as of 06/23/21 0356  Tue Jun 23, 2021  0212 Patient undergoing tele neuro consult at this time [DW]  0230 D/w teleneuro, recommends MRI/EEG/admission [DW]    Clinical Course User  Index [DW] Ripley Fraise, MD                           Medical Decision Making Amount and/or Complexity of Data Reviewed Labs: ordered. Radiology: ordered.  Risk Decision regarding hospitalization.   This patient presents to the ED  for concern of weakness, this involves an extensive number of treatment options, and is a complaint that carries with it a high risk of complications and morbidity.  The differential diagnosis includes stroke, medication reaction, infection, substance abuse  Comorbidities that complicate the patient evaluation: Patients presentation is complicated by their history of bipolar disorder    Additional history obtained: Additional history obtained from family (sister) Bonne Dolores calling son at [phone] but no answer Records reviewed Primary Care Documents  Lab Tests: I Ordered, and personally interpreted labs.  The pertinent results include: Labs overall reassuring  Imaging Studies ordered: I ordered imaging studies including CT scan head I independently visualized and interpreted imaging which showed no acute findings I agree with the radiologist interpretation  Cardiac Monitoring: The patient was maintained on a cardiac monitor.  I personally viewed and interpreted the cardiac monitor which showed an underlying rhythm of:  sinus rhythm    Critical Interventions:       Neurology consultation  Consultations Obtained: I requested consultation with the admitting physician [facility] and consultant neurology, and discussed  findings as well as pertinent plan - they recommend: Admission  Reevaluation: After the interventions noted above, I reevaluated the patient and found that they have :stayed the same  Complexity of problems addressed: Patients presentation is most consistent with  acute complicated illness/injury requiring diagnostic workup      Disposition: After consideration of the diagnostic results and the patients response to  treatment,  I feel that the patent would benefit from admission .   Patient with recent alteration mental status, falls, reported sensory deficit and speech alteration.  Patient seen by neurology who recommends further evaluation with admission, MRI and further neurologic testing. This is been ongoing for several days and is not a candidate for any acute intervention Discussed with Dr. Myna Hidalgo for admission         Final Clinical Impression(s) / ED Diagnoses Final diagnoses:  Acute alteration in mental status    Rx / DC Orders ED Discharge Orders     None         Ripley Fraise, MD 06/23/21 [phone]

## 2021-06-23 NOTE — ED Notes (Signed)
Unable to complete ortho vitals. Pt refused to Stand up.

## 2021-06-23 NOTE — H&P (Signed)
. History and Physical    [name] AVW:[id number] DOB: 1965/06/13 DOA: 06/22/2021  PCP: Berkley Harvey, NP   Patient coming from: Home.  I have personally briefly reviewed patient's old medical records in [place]  Chief Complaint: Weakness, falls, blurred vision, speech difficulty and hallucinations.  HPI: [name] is a 56 y.o. female with medical history significant of allergic rhinitis, pet dander allergies, iron deficiency anemia, anxiety, depression, bipolar disorder, osteoarthritis, history of back pain, cervicalgia, fibromyalgia, chronic fatigue syndrome, type II DM, GERD, Graves' disease, subclinical thyroidectomy, heart murmur, hyperlipidemia, hypertension, lactose intolerance, OSA not on CPAP, osteoporosis, panic disorder, history of rheumatoid arthritis, vasovagal syncopal episode, vitamin D deficiency who is coming with a 3-day history of progressively worse weakness associated with several falls, headache, pressured and slurred speech, left-sided weakness and hallucinations.  At this moment, the patient is not a good historian.  However, per patient, she broke up with her fianc about 3 to 4 weeks ago and she had to change her phone number after he would not stop calling her.  She has been very anxious but was not having above symptoms until just a few days ago.  No fever, night sweats, positive chills and decreased appetite.  No rhinorrhea, sore throat, dyspnea, wheezing or hemoptysis.  No chest pain, dizziness, diaphoresis, PND, orthopnea or recent pitting edema of the lower extremities.  No abdominal pain, diarrhea, melena or hematochezia.  She occasionally gets constipated.  No dysuria, flank pain or hematuria.  No polyuria, polydipsia, polyphagia or blurred vision.  ED Course: Initial vital signs were temperature 98.5 F, pulse 91, respiration 17, BP 121/81 mmHg and O2 sat 99% on room air.  The patient received a milligram of lorazepam IVP for MRI scan.  Lab  work: UDS was negative.  CBC was normal.  CMP showed a potassium of 3.4 mmol/L, BUN of 21 mg/dL, total protein 6.3 and albumin 3.3 g/dL.  The rest of the CMP values are unremarkable when calcium is corrected to albumin level.  Imaging: CT head without contrast and MRI brain without contrast did not show any acute abnormality.  Review of Systems: As per HPI otherwise all other systems reviewed and are negative.  Past Medical History:  Diagnosis Date   Allergic rhinitis    Allergic to cats    pet dander   Allergy    Anemia    Anxiety    Arthritis 09/2018   both feet   Asthma    Back pain    Bipolar disorder (HCC)    Cervicalgia    Chronic fatigue syndrome    Constipation    Depression    Diabetes mellitus without complication ([place])    "pre"   Dyspnea    Environmental allergies    Fibromyalgia    GERD (gastroesophageal reflux disease)    Grave's disease    Heart murmur    High cholesterol    History of blood clots    Hypertension    Joint pain    Lactose intolerance    Lower extremity edema    Multiple food allergies    OSA (obstructive sleep apnea)    Osteoporosis    Palpitations    Panic disorder    Rheumatoid arthritis ([place])    Risk for falls    Sciatica    Severe 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 ([place])    Sleep apnea    no cpap worn   Syncope    Syncope  and collapse    Thyroid disease    Vasovagal syncope    Vertigo    Vitamin D deficiency     Past Surgical History:  Procedure Laterality Date   ABDOMINAL HYSTERECTOMY  2006   COLONOSCOPY     GASTRIC BYPASS  2008    Social History  reports that she has never smoked. She has never used smokeless tobacco. She reports that she does not currently use alcohol. She reports that she does not use drugs.  Allergies  Allergen Reactions   Bee Venom Anaphylaxis    Other reaction(s): PRURITUS, RASH   Coconut Fatty Acids Anaphylaxis    Patient allergic to coconut in general   Mushroom  Ext Cmplx(Shiitake-Reishi-Mait) Anaphylaxis   Nutritional Supplements Anaphylaxis and Itching    walnuts   Other Anaphylaxis, Hives and Itching    Ragweed   Shellfish Allergy Anaphylaxis   Strawberry Extract Anaphylaxis   Hydrocodone-Acetaminophen Itching   Latex     Family History  Problem Relation Age of Onset   Hypertension Mother    Cirrhosis Mother    Alcohol abuse Mother    Depression Mother    Physical abuse Mother    Hyperlipidemia Mother    Thyroid disease Mother    Anxiety disorder Mother    Hypertension Sister    Alcohol abuse Sister    Drug abuse Sister    Depression Sister    Anxiety disorder Sister    OCD Sister    Hypertension Father    Alcohol abuse Father    Hyperlipidemia Father    Depression Father    Drug abuse Father    ADD / ADHD Other    Depression Sister    Diabetes Other    Hypertension Other    Diabetes Maternal Uncle    Seizures Cousin    Dementia Neg Hx    Colon cancer Neg Hx    Esophageal cancer Neg Hx    Rectal cancer Neg Hx    Stomach cancer Neg Hx    Prior to Admission medications   Medication Sig Start Date End Date Taking? Authorizing Provider  albuterol (VENTOLIN HFA) 108 (90 Base) MCG/ACT inhaler Inhale 2 puffs into the lungs every 6 (six) hours as needed for wheezing. 12/20/19   Whitmire, Joneen Boers, FNP  aspirin EC 81 MG tablet Take 81 mg by mouth daily. Swallow whole.    [provider]  azelastine (ASTELIN) 0.1 % nasal spray Place into both nostrils 2 (two) times daily. Use in each nostril as directed    [provider]  budesonide-formoterol (SYMBICORT) 80-4.5 MCG/ACT inhaler Inhale 2 puffs into the lungs 2 (two) times daily as needed (wheezing/sob).     [provider]  buPROPion (WELLBUTRIN XL) 150 MG 24 hr tablet Take 3 tablets (450 mg total) by mouth daily. 05/07/21   Charlcie Cradle, MD  Cetirizine HCl 10 MG CAPS Take 1 capsule by mouth daily as needed. 08/08/19   [provider]   clonazePAM (KLONOPIN) 0.5 MG tablet Take 1 tablet (0.5 mg total) by mouth 2 (two) times daily as needed for 14 days for anxiety, THEN 1 tablet (0.5 mg total) daily as needed for up to 14 days for anxiety. 04/09/21 05/07/21  Charlcie Cradle, MD  diclofenac sodium (VOLTAREN) 1 % GEL Apply 2 g topically 4 (four) times daily. 12/12/18   Trula Slade, DPM  divalproex (DEPAKOTE ER) 500 MG 24 hr tablet Take 3 tablets (1,500 mg total) by mouth daily. 05/07/21  Charlcie Cradle, MD  doxepin (SINEQUAN) 150 MG capsule Take 1 capsule (150 mg total) by mouth at bedtime. 05/07/21   Charlcie Cradle, MD  estradiol (ESTRACE) 0.5 MG tablet Take 0.5 mg by mouth daily. 07/20/19   [provider]  fluticasone (FLONASE) 50 MCG/ACT nasal spray PLACE 1 SPRAY INTO BOTH NOSTRILS AS NEEDED (ALLERGIES/RUNNY NOSE). 10/22/20   Whitmire, Dawn W, FNP  Galcanezumab-gnlm (EMGALITY) 120 MG/ML SOAJ INJECT 120 MG INTO THE SKIN EVERY 30 DAYS 04/21/21   Melvenia Beam, MD  hydrochlorothiazide (MICROZIDE) 12.5 MG capsule TAKE 1 CAPSULE (12.5 MG TOTAL) BY MOUTH DAILY AS NEEDED (EDEMA). 04/20/21   Georgia Lopes, DO  linaclotide Rolan Lipa) 290 MCG CAPS capsule Take 1 capsule (290 mcg total) by mouth daily before breakfast. 05/05/21   Whitmire, Joneen Boers, FNP  meloxicam (MOBIC) 15 MG tablet Take 15 mg by mouth daily. 06/17/20   [provider]  MOVANTIK 25 MG TABS tablet Take 25 mg by mouth daily. 07/27/19   [provider]  Multiple Vitamin (MULTIVITAMIN) tablet Take 1 tablet by mouth daily.    [provider]  phentermine 15 MG capsule Take 1 capsule (15 mg total) by mouth every morning. 06/03/21   Jearld Lesch A, DO  prazosin (MINIPRESS) 2 MG capsule Take 1 capsule (2 mg total) by mouth at bedtime. 05/07/21   Charlcie Cradle, MD  Semaglutide, 1 MG/DOSE, (OZEMPIC, 1 MG/DOSE,) 4 MG/3ML SOPN Inject 1 mg into the skin once a week. 05/05/21   Whitmire, Joneen Boers, FNP  tapentadol HCl (NUCYNTA) 75 MG tablet Take 75  mg by mouth every 8 (eight) hours as needed.    [provider]  Thiamine HCl (VITAMIN B-1 PO) Take 1 tablet by mouth daily.     [provider]  topiramate (TOPAMAX) 25 MG tablet Take 1 tablet (25 mg total) by mouth at bedtime. 06/03/21   Georgia Lopes, DO  Vitamin D, Ergocalciferol, (DRISDOL) 1.25 MG (50000 UNIT) CAPS capsule Take 1 capsule (50,000 Units total) by mouth every 7 (seven) days. 04/07/21   Georgia Lopes, DO    Physical Exam: Vitals:   06/23/21 0630 06/23/21 0645 06/23/21 0700 06/23/21 0900  BP: 114/69 135/77 139/83 (!) 112/56  Pulse: 77 81 98 80  Resp: 17  16 14   Temp:      TempSrc:      SpO2: 93% 94% 97% 98%    Constitutional: NAD, calm, comfortable Eyes: PERRL, lids and conjunctivae normal ENMT: Mucous membranes are moist. Posterior pharynx clear of any exudate or lesions. Neck: normal, supple, no masses, no thyromegaly Respiratory: clear to auscultation bilaterally, no wheezing, no crackles. Normal respiratory effort. No accessory muscle use.  Cardiovascular: Regular rate and rhythm, no murmurs / rubs / gallops. No extremity edema. 2+ pedal pulses. No carotid bruits.  Abdomen: Obese, soft, no tenderness, no masses palpated. No hepatosplenomegaly. Bowel sounds positive.  Musculoskeletal: no clubbing / cyanosis. Good ROM, no contractures. Normal muscle tone.  Skin: no rashes, lesions, ulcers. No induration Neurologic: CN 2-12 grossly intact. Sensation intact, DTR normal. Strength 5/5 in all 4.  Psychiatric: Alert and oriented x 3.  Mildly anxious l mood.   Labs on Admission: I have personally reviewed following labs and imaging studies  CBC: Recent Labs  Lab 06/22/21 2301  WBC 7.5  NEUTROABS 4.4  HGB 12.0  HCT 37.8  MCV 92.9  PLT 188    Basic Metabolic Panel: Recent Labs  Lab 06/23/21 0043  NA 139  K  3.4*  CL 110  CO2 22  GLUCOSE 99  BUN 21*  CREATININE 0.88  CALCIUM 8.4*    GFR: CrCl cannot be calculated (Unknown ideal  weight.).  Liver Function Tests: Recent Labs  Lab 06/23/21 0043  AST 22  ALT 14  ALKPHOS 52  BILITOT 0.6  PROT 6.3*  ALBUMIN 3.3*   Radiological Exams on Admission: CT HEAD WO CONTRAST  Result Date: 06/22/2021 CLINICAL DATA:  Slurred speech, headache EXAM: CT HEAD WITHOUT CONTRAST TECHNIQUE: Contiguous axial images were obtained from the base of the skull through the vertex without intravenous contrast. RADIATION DOSE REDUCTION: This exam was performed according to the departmental dose-optimization program which includes automated exposure control, adjustment of the mA and/or kV according to patient size and/or use of iterative reconstruction technique. COMPARISON:  11/12/2019 FINDINGS: Brain: No evidence of acute infarction, hemorrhage, hydrocephalus, extra-axial collection or mass lesion/mass effect. Vascular: No hyperdense vessel or unexpected calcification. Skull: Normal. Negative for fracture or focal lesion. Sinuses/Orbits: The visualized paranasal sinuses are essentially clear. The mastoid air cells are unopacified. Other: None. IMPRESSION: Normal head CT. Electronically Signed   By: Julian Hy M.D.   On: 06/22/2021 23:20    EKG: Independently reviewed.  Vent. rate 86 BPM PR interval 191 ms QRS duration 96 ms QT/QTcB 395/473 ms P-R-T axes 30 -10 62 Sinus rhythm Left ventricular hypertrophy  Assessment/Plan Principal Problem:   Multiple neurological symptoms Observation/telemetry. Transfer to the neurology unit at [facility]. Check TSH and B12. Check ESR and CRP. Consult psychiatry. Neurochecks every 4 hours. Neurology evaluation once [facility]. Psychiatry has asked to hold psychotropic meds.  Active Problems:   GERD (gastroesophageal reflux disease) Famotidine 20 mg p.o. twice daily.    Hypokalemia Replacing. Follow-up sodium level.    Essential hypertension Hold antihypertensives. Blood pressures has been soft.    OSA (obstructive sleep apnea) Not on CPAP.     Protein-calorie malnutrition, mild ([place]) Has not been eating much recently.    DVT prophylaxis: Lovenox SQ. Code Status:   Full code. Family Communication:   Disposition Plan:   Patient is from:  Home.  Anticipated DC to:  Home.  Anticipated DC date:  06/25/2021.  Anticipated DC barriers: Clinical status.  Consults called:  Teleneurology. Admission status:  Observation/telemetry  Severity of Illness: High severity in the setting of left-sided weakness and encephalopathic symptoms as above.  Reubin Milan MD [facility]  How to contact the [facility] Attending or Consulting provider [place] or covering provider during after hours [place], for this patient?   Check the care team in [facility] and look for a) attending/consulting [facility] provider listed and b) the [facility] team listed Log into [email or web address] and use [place]'s universal password to access. If you do not have the password, please contact the hospital operator. Locate the [facility] provider you are looking for under [facility] and page to a number that you can be directly reached. If you still have difficulty reaching the provider, please page the [facility] (Director on Call) for the Hospitalists listed on amion for assistance.  06/23/2021, 10:41 AM   This document was prepared using Dragon voice recognition software and may contain some unintended transcription errors.

## 2021-06-24 ENCOUNTER — Observation Stay (HOSPITAL_COMMUNITY): Payer: Medicare Other

## 2021-06-24 ENCOUNTER — Other Ambulatory Visit (HOSPITAL_COMMUNITY): Payer: Self-pay | Admitting: Psychiatry

## 2021-06-24 DIAGNOSIS — I10 Essential (primary) hypertension: Secondary | ICD-10-CM

## 2021-06-24 DIAGNOSIS — R4182 Altered mental status, unspecified: Secondary | ICD-10-CM | POA: Diagnosis not present

## 2021-06-24 DIAGNOSIS — F3181 Bipolar II disorder: Secondary | ICD-10-CM

## 2021-06-24 DIAGNOSIS — R4781 Slurred speech: Secondary | ICD-10-CM | POA: Diagnosis not present

## 2021-06-24 DIAGNOSIS — F411 Generalized anxiety disorder: Secondary | ICD-10-CM

## 2021-06-24 DIAGNOSIS — E441 Mild protein-calorie malnutrition: Secondary | ICD-10-CM

## 2021-06-24 DIAGNOSIS — G4733 Obstructive sleep apnea (adult) (pediatric): Secondary | ICD-10-CM

## 2021-06-24 DIAGNOSIS — R2990 Unspecified symptoms and signs involving the nervous system: Secondary | ICD-10-CM | POA: Diagnosis not present

## 2021-06-24 DIAGNOSIS — R441 Visual hallucinations: Secondary | ICD-10-CM | POA: Diagnosis not present

## 2021-06-24 DIAGNOSIS — E876 Hypokalemia: Secondary | ICD-10-CM | POA: Diagnosis not present

## 2021-06-24 DIAGNOSIS — G4700 Insomnia, unspecified: Secondary | ICD-10-CM

## 2021-06-24 LAB — COMPREHENSIVE METABOLIC PANEL
ALT: 14 U/L (ref 0–44)
AST: 18 U/L (ref 15–41)
Albumin: 3.1 g/dL — ABNORMAL LOW (ref 3.5–5.0)
Alkaline Phosphatase: 51 U/L (ref 38–126)
Anion gap: 8 (ref 5–15)
BUN: 13 mg/dL (ref 6–20)
CO2: 23 mmol/L (ref 22–32)
Calcium: 8.7 mg/dL — ABNORMAL LOW (ref 8.9–10.3)
Chloride: 110 mmol/L (ref 98–111)
Creatinine, Ser: 1 mg/dL (ref 0.44–1.00)
GFR, Estimated: >60 mL/min (ref >60)
Glucose, Bld: 98 mg/dL (ref 70–99)
Potassium: 3.5 mmol/L (ref 3.5–5.1)
Sodium: 141 mmol/L (ref 135–145)
Total Bilirubin: 0.4 mg/dL (ref 0.3–1.2)
Total Protein: 5.9 g/dL — ABNORMAL LOW (ref 6.5–8.1)

## 2021-06-24 MED ORDER — DOXEPIN HCL 100 MG PO CAPS: 100.0000 mg | ORAL_CAPSULE | Freq: Every day | ORAL | 0 refills | Status: DC

## 2021-06-24 MED ORDER — STROKE: EARLY STAGES OF RECOVERY BOOK
Status: AC
  Filled 2021-06-24: qty 1

## 2021-06-24 MED ORDER — MELATONIN 3 MG PO TABS
3.0000 mg | ORAL_TABLET | Freq: Every day | ORAL | Status: DC
  Administered 2021-06-24: 3 mg via ORAL
  Filled 2021-06-24: qty 1
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
## 2021-06-24 NOTE — Discharge Summary (Signed)
Physician Discharge Summary   Patient: [name] MRN: [id number] DOB: 10-26-1965  Admit date:     06/22/2021  Discharge date: 06/24/21  Discharge Physician: Patrecia Pour   PCP: Berkley Harvey, NP   Recommendations at discharge:   Follow up with weight loss provider and with psychiatry. Recommend discontinuation of centrally acting agents.  Discharge Diagnoses: Principal Problem:   Multiple neurological symptoms Active Problems:   GERD (gastroesophageal reflux disease)   Hypokalemia   Essential hypertension   OSA (obstructive sleep apnea)   Protein-calorie malnutrition, mild [facility])  Hospital Course: HPI: [name] is a 56 y.o. female with medical history significant of allergic rhinitis, pet dander allergies, iron deficiency anemia, anxiety, depression, bipolar disorder, osteoarthritis, history of back pain, cervicalgia, fibromyalgia, chronic fatigue syndrome, type II DM, GERD, Graves' disease, subclinical thyroidectomy, heart murmur, hyperlipidemia, hypertension, lactose intolerance, OSA not on CPAP, osteoporosis, panic disorder, history of rheumatoid arthritis, vasovagal syncopal episode, vitamin D deficiency who is coming with a 3-day history of progressively worse weakness associated with several falls, headache, pressured and slurred speech, left-sided weakness and hallucinations.  At this moment, the patient is not a good historian.  However, per patient, she broke up with her fianc about 3 to 4 weeks ago and she had to change her phone number after he would not stop calling her.  She has been very anxious but was not having above symptoms until just a few days ago.  No fever, night sweats, positive chills and decreased appetite.  No rhinorrhea, sore throat, dyspnea, wheezing or hemoptysis.  No chest pain, dizziness, diaphoresis, PND, orthopnea or recent pitting edema of the lower extremities.  No abdominal pain, diarrhea, melena or hematochezia.  She occasionally gets  constipated.  No dysuria, flank pain or hematuria.  No polyuria, polydipsia, polyphagia or blurred vision.   ED Course: Initial vital signs were temperature 98.5 F, pulse 91, respiration 17, BP 121/81 mmHg and O2 sat 99% on room air.  The patient received a milligram of lorazepam IVP for MRI scan.   Lab work: UDS was negative.  CBC was normal.  CMP showed a potassium of 3.4 mmol/L, BUN of 21 mg/dL, total protein 6.3 and albumin 3.3 g/dL.  The rest of the CMP values are unremarkable when calcium is corrected to albumin level.   Imaging: CT head without contrast and MRI brain without contrast did not show any acute abnormality.  Assessment and Plan:   Multiple neurological symptoms: Etiology of presentation is likely polypharmacy since organic neurological work up was unremarkable, and she has improved significantly while holding meds. Psychiatry consulted and concurs, recommending discontinuation of weight loss medications which trigger anxiety. The patient is reluctant, though stopping phentermine and topamax was recommended. Note duplication of GLP-1 agents, so trulicity was stopped. Semaglutide to continue.  - Continue depakote for now due to bipolar diagnosis per psychiatry, consider protracted cross taper to lamictal. Defer further management to outpatient providers.       GERD (gastroesophageal reflux disease) Famotidine 20 mg p.o. twice daily.     Hypokalemia supplemented    Essential hypertension: No change to home regimen     OSA (obstructive sleep apnea) Not on CPAP.     Protein-calorie malnutrition, mild ([place]) Has not been eating much recently.   Obesity: Body mass index is 39 kg/m.   Consultants: Psychiatry Procedures performed: None  Disposition: Home Diet recommendation:  Discharge Diet Orders (From admission, onward)     Start  Ordered   06/24/21 0000  Diet - low sodium heart healthy        06/24/21 1611           DISCHARGE MEDICATION: Allergies as of  06/24/2021       Reactions   Bee Venom Anaphylaxis   Other reaction(s): PRURITUS, RASH   Coconut Fatty Acids Anaphylaxis   Patient allergic to coconut in general   Mushroom Ext Cmplx(shiitake-reishi-mait) Anaphylaxis   Nutritional Supplements Anaphylaxis, Itching   walnuts   Other Anaphylaxis, Hives, Itching   Ragweed   Shellfish Allergy Anaphylaxis   Strawberry Extract Anaphylaxis   Hydrocodone-acetaminophen Itching   Latex    Hydrocodone-acetaminophen Rash   Other reaction(s): Colleen Other reaction(s): Other (See Comments)        Medication List     STOP taking these medications    ALPRAZolam 0.25 MG tablet Commonly known as: XANAX   methylPREDNISolone 4 MG Tbpk tablet Commonly known as: MEDROL DOSEPAK   phentermine 15 MG capsule   predniSONE 10 MG (21) Tbpk tablet Commonly known as: STERAPRED UNI-PAK 21 TAB   topiramate 25 MG tablet Commonly known as: Topamax   Trulicity 3 0000000 Sopn Generic drug: Dulaglutide       TAKE these medications    albuterol 108 (90 Base) MCG/ACT inhaler Commonly known as: VENTOLIN HFA Inhale 2 puffs into the lungs every 6 (six) hours as needed for wheezing.   aspirin EC 81 MG tablet Take 81 mg by mouth daily. Swallow whole.   azelastine 0.1 % nasal spray Commonly known as: ASTELIN Place into both nostrils 2 (two) times daily. Use in each nostril as directed   benzonatate 100 MG capsule Commonly known as: TESSALON Take 100 mg by mouth 3 (three) times daily as needed.   budesonide-formoterol 80-4.5 MCG/ACT inhaler Commonly known as: SYMBICORT Inhale 2 puffs into the lungs 2 (two) times daily as needed (wheezing/sob).   buPROPion 150 MG 24 hr tablet Commonly known as: WELLBUTRIN XL Take 3 tablets (450 mg total) by mouth daily.   cetirizine 10 MG tablet Commonly known as: ZYRTEC Take 10 mg by mouth daily. What changed: Another medication with the same name was removed. Continue taking this medication, and follow  the directions you see here.   clonazePAM 0.5 MG tablet Commonly known as: KLONOPIN Take 1 tablet (0.5 mg total) by mouth 2 (two) times daily as needed for 14 days for anxiety, THEN 1 tablet (0.5 mg total) daily as needed for up to 14 days for anxiety. Start taking on: April 09, 2021   cyclobenzaprine 10 MG tablet Commonly known as: FLEXERIL   diclofenac sodium 1 % Gel Commonly known as: VOLTAREN Apply 2 g topically 4 (four) times daily.   divalproex 500 MG 24 hr tablet Commonly known as: DEPAKOTE ER Take 3 tablets (1,500 mg total) by mouth daily.   doxepin 100 MG capsule Commonly known as: SINEQUAN Take 1 capsule (100 mg total) by mouth at bedtime. What changed:  medication strength how much to take Another medication with the same name was removed. Continue taking this medication, and follow the directions you see here.   doxycycline 100 MG tablet Commonly known as: VIBRA-TABS   Emgality 120 MG/ML Soaj Generic drug: Galcanezumab-gnlm INJECT 120 MG INTO THE SKIN EVERY 30 DAYS   estradiol 0.5 MG tablet Commonly known as: ESTRACE Take 0.5 mg by mouth daily.   fluticasone 50 MCG/ACT nasal spray Commonly known as: FLONASE PLACE 1 SPRAY INTO BOTH NOSTRILS AS NEEDED (  ALLERGIES/RUNNY NOSE).   hydrochlorothiazide 12.5 MG capsule Commonly known as: MICROZIDE TAKE 1 CAPSULE (12.5 MG TOTAL) BY MOUTH DAILY AS NEEDED (EDEMA).   lidocaine 2 % solution Commonly known as: XYLOCAINE   linaclotide 290 MCG Caps capsule Commonly known as: Linzess Take 1 capsule (290 mcg total) by mouth daily before breakfast. What changed: Another medication with the same name was removed. Continue taking this medication, and follow the directions you see here.   meloxicam 15 MG tablet Commonly known as: MOBIC Take 15 mg by mouth daily.   Movantik 25 MG Tabs tablet Generic drug: naloxegol oxalate Take 25 mg by mouth daily.   multivitamin tablet Take 1 tablet by mouth daily.   Ozempic  (1 MG/DOSE) 4 MG/3ML Sopn Generic drug: Semaglutide (1 MG/DOSE) Inject 1 mg into the skin once a week.   prazosin 2 MG capsule Commonly known as: MINIPRESS Take 1 capsule (2 mg total) by mouth at bedtime.   tapentadol HCl 75 MG tablet Commonly known as: NUCYNTA Take 75 mg by mouth every 8 (eight) hours as needed.   VITAMIN B-1 PO Take 1 tablet by mouth daily.   Vitamin D (Ergocalciferol) 1.25 MG (50000 UNIT) Caps capsule Commonly known as: DRISDOL Take 1 capsule (50,000 Units total) by mouth every 7 (seven) days.        Follow-up Information     Charlcie Cradle, MD Follow up.   Specialty: Psychiatry Contact information: [place] [id number] [id number]         Berkley Harvey, NP Follow up.   Specialty: Nurse Practitioner Contact information: [address] [place] [address] [phone]                 Discharge Exam: Danley Danker Weights   06/24/21 0300  Weight: 106.3 kg   No distress, not responding to internal stimuli  Condition at discharge: improving  The results of significant diagnostics from this hospitalization (including imaging, microbiology, ancillary and laboratory) are listed below for reference.   Imaging Studies: CT HEAD WO CONTRAST  Result Date: 06/22/2021 CLINICAL DATA:  Slurred speech, headache EXAM: CT HEAD WITHOUT CONTRAST TECHNIQUE: Contiguous axial images were obtained from the base of the skull through the vertex without intravenous contrast. RADIATION DOSE REDUCTION: This exam was performed according to the departmental dose-optimization program which includes automated exposure control, adjustment of the mA and/or kV according to patient size and/or use of iterative reconstruction technique. COMPARISON:  11/12/2019 FINDINGS: Brain: No evidence of acute infarction, hemorrhage, hydrocephalus, extra-axial collection or mass lesion/mass effect. Vascular: No hyperdense vessel or unexpected  calcification. Skull: Normal. Negative for fracture or focal lesion. Sinuses/Orbits: The visualized paranasal sinuses are essentially clear. The mastoid air cells are unopacified. Other: None. IMPRESSION: Normal head CT. Electronically Signed   By: Julian Hy M.D.   On: 06/22/2021 23:20   MR BRAIN WO CONTRAST  Result Date: 06/23/2021 CLINICAL DATA:  Bipolar disorder, 3 days ago started having progressive generalized weakness, falls, hallucinations, speech difficulties, confusion EXAM: MRI HEAD WITHOUT CONTRAST TECHNIQUE: Multiplanar, multiecho pulse sequences of the brain and surrounding structures were obtained without intravenous contrast. COMPARISON:  CT head dated 1 day prior, MR head 03/21/2018 FINDINGS: Brain: There is no evidence of acute intracranial hemorrhage, extra-axial fluid collection, or acute infarct. Parenchymal volume is normal. The ventricles are normal in size. There are scattered small foci of FLAIR signal abnormality in the subcortical and periventricular white matter, nonspecific but likely reflecting sequela of mild chronic white  matter microangiopathy, not significantly changed since 2019. There is no suspicious parenchymal signal abnormality. There is no mass lesion. There is no midline shift. Vascular: Normal flow voids. Skull and upper cervical spine: Normal marrow signal. Sinuses/Orbits: The paranasal sinuses are clear. The globes and orbits are unremarkable. Other: None. IMPRESSION: Unremarkable brain MRI. Electronically Signed   By: Valetta Mole M.D.   On: 06/23/2021 10:53   EEG adult  Result Date: 06/24/2021 Lora Havens, MD     06/24/2021 12:14 PM Patient Name: [name] MRN: [id number] Epilepsy Attending: Lora Havens Referring Physician/Provider: Patrecia Pour, MD Date: 06/24/2021 Duration: 21.27 mins Patient history: 56yo F with syncope, progressive generalized weakness, frequent falls, hallucinations and difficulty with speech. EEG to evaluate for seizure  Level of alertness: Awake AEDs during EEG study: None Technical aspects: This EEG study was done with scalp electrodes positioned according to the 10-20 International system of electrode placement. Electrical activity was acquired at a sampling rate of 500Hz  and reviewed with a high frequency filter of 70Hz  and a low frequency filter of 1Hz . EEG data were recorded continuously and digitally stored. Description: The posterior dominant rhythm consists of 8 Hz activity of moderate voltage (25-35 uV) seen predominantly in posterior head regions, symmetric and reactive to eye opening and eye closing.  Hyperventilation and photic stimulation were not performed.   IMPRESSION: This study is within normal limits. No seizures or epileptiform discharges were seen throughout the recording. Lora Havens     Microbiology: Results for orders placed or performed during the hospital encounter of 06/22/21  Resp Panel by RT-PCR (Flu A&B, Covid) Nasopharyngeal Swab     Status: None   Collection Time: 06/23/21  2:55 AM   Specimen: Nasopharyngeal Swab; Nasopharyngeal(NP) swabs in vial transport medium  Result Value Ref Range Status   SARS Coronavirus 2 by RT PCR NEGATIVE NEGATIVE Final    Comment: (NOTE) SARS-CoV-2 target nucleic acids are NOT DETECTED.  The SARS-CoV-2 RNA is generally detectable in upper respiratory specimens during the acute phase of infection. The lowest concentration of SARS-CoV-2 viral copies this assay can detect is 138 copies/mL. A negative result does not preclude SARS-Cov-2 infection and should not be used as the sole basis for treatment or other patient management decisions. A negative result may occur with  improper specimen collection/handling, submission of specimen other than nasopharyngeal swab, presence of viral mutation(s) within the areas targeted by this assay, and inadequate number of viral copies(<138 copies/mL). A negative result must be combined with clinical observations,  patient history, and epidemiological information. The expected result is Negative.  Fact Sheet for Patients:  EntrepreneurPulse.com.au  Fact Sheet for Healthcare Providers:  IncredibleEmployment.be  This test is no t yet approved or cleared by the [place] FDA and  has been authorized for detection and/or diagnosis of SARS-CoV-2 by FDA under an Emergency Use Authorization (EUA). This EUA will remain  in effect (meaning this test can be used) for the duration of the COVID-19 declaration under Section 564(b)(1) of the Act, 21 U.S.C.section 360bbb-3(b)(1), unless the authorization is terminated  or revoked sooner.       Influenza A by PCR NEGATIVE NEGATIVE Final   Influenza B by PCR NEGATIVE NEGATIVE Final    Comment: (NOTE) The Xpert Xpress SARS-CoV-2/FLU/RSV plus assay is intended as an aid in the diagnosis of influenza from Nasopharyngeal swab specimens and should not be used as a sole basis for treatment. Nasal washings and aspirates are unacceptable for Xpert Xpress  SARS-CoV-2/FLU/RSV testing.  Fact Sheet for Patients: EntrepreneurPulse.com.au  Fact Sheet for Healthcare Providers: IncredibleEmployment.be  This test is not yet approved or cleared by the [place] FDA and has been authorized for detection and/or diagnosis of SARS-CoV-2 by FDA under an Emergency Use Authorization (EUA). This EUA will remain in effect (meaning this test can be used) for the duration of the COVID-19 declaration under Section 564(b)(1) of the Act, 21 U.S.C. section 360bbb-3(b)(1), unless the authorization is terminated or revoked.  Performed at [facility], [place] [address] [place], [place] [address]     Labs: CBC: Recent Labs  Lab 06/22/21 2301  WBC 7.5  NEUTROABS 4.4  HGB 12.0  HCT 37.8  MCV 92.9  PLT [id number]   Basic Metabolic Panel: Recent Labs  Lab 06/23/21 0043 06/24/21 0426   NA 139 141  K 3.4* 3.5  CL 110 110  CO2 22 23  GLUCOSE 99 98  BUN 21* 13  CREATININE 0.88 1.00  CALCIUM 8.4* 8.7*   Liver Function Tests: Recent Labs  Lab 06/23/21 0043 06/24/21 0426  AST 22 18  ALT 14 14  ALKPHOS 52 51  BILITOT 0.6 0.4  PROT 6.3* 5.9*  ALBUMIN 3.3* 3.1*   CBG: Recent Labs  Lab 06/23/21 0156  GLUCAP 100*    Discharge time spent: greater than 30 minutes.  Signed: Patrecia Pour, MD [facility] 06/24/2021

## 2021-06-24 NOTE — TOC Transition Note (Signed)
Transition of Care [facility](TOC) - CM/SW Discharge Note   Patient Details  Name: [name]  MRN: [id number] Date of Birth: 12/10/1965  Transition of Care [facility](TOC) CM/SW Contact:  Kermit BaloKelli F Tadan Shill, RN Phone Number: 06/24/2021, 4:14 PM   Clinical Narrative:    Pt discharging home with self care. Pt has transport home.    Final next level of care: Home/Self Care Barriers to Discharge: No Barriers Identified   Patient Goals and CMS Choice        Discharge Placement                       Discharge Plan and Services                                     Social Determinants of Health (SDOH) Interventions     Readmission Risk Interventions No flowsheet data found.

## 2021-06-24 NOTE — Progress Notes (Signed)
EEG complete - results pending 

## 2021-06-24 NOTE — Progress Notes (Signed)
Pt planned for discharge.  She verbalizes understanding of all discharge orders, where to pick up prescription, and follow up appointments.  Has all belongings with her.

## 2021-06-24 NOTE — Procedures (Signed)
Patient Name: [name]  MRN: [id number]  Epilepsy Attending: Charlsie QuestPriyanka O Icis Budreau  Referring Physician/Provider: Tyrone NineGrunz, Ryan B, MD Date: 06/24/2021 Duration: 21.27 mins  Patient history: 56yo F with syncope, progressive generalized weakness, frequent falls, hallucinations and difficulty with speech. EEG to evaluate for seizure  Level of alertness: Awake  AEDs during EEG study: None  Technical aspects: This EEG study was done with scalp electrodes positioned according to the 10-20 International system of electrode placement. Electrical activity was acquired at a sampling rate of 500Hz  and reviewed with a high frequency filter of 70Hz  and a low frequency filter of 1Hz . EEG data were recorded continuously and digitally stored.   Description: The posterior dominant rhythm consists of 8 Hz activity of moderate voltage (25-35 uV) seen predominantly in posterior head regions, symmetric and reactive to eye opening and eye closing.  Hyperventilation and photic stimulation were not performed.     IMPRESSION: This study is within normal limits. No seizures or epileptiform discharges were seen throughout the recording.  [name] [name] [name]

## 2021-06-24 NOTE — Care Management Obs Status (Signed)
[place] NOTIFICATION   Patient Details  Name: [name] MRN: [id number] Date of Birth: 09/15/1965   Medicare Observation Status Notification Given:  Yes    Pollie Friar, RN 06/24/2021, 1:08 PM

## 2021-06-24 NOTE — TOC Initial Note (Signed)
Transition of Care [facility](TOC) - Initial/Assessment Note    Patient Details  Name: [name] MRN: [id number] Date of Birth: 10/03/1965  Transition of Care [facility](TOC) CM/SW Contact:    Kermit BaloKelli F Gwynneth Fabio, RN Phone Number: 06/24/2021, 1:12 PM  Clinical Narrative:                 Patient lives at home with her adult son. She denies issues with home medications or transportation. She is working with her insurance to get a walk in shower as she has issues using her garden tub at home.  TOC following for needs.   Expected Discharge Plan: Home/Self Care Barriers to Discharge: Continued Medical Work up   Patient Goals and CMS Choice        Expected Discharge Plan and Services Expected Discharge Plan: Home/Self Care       Living arrangements for the past 2 months: Single Family Home                                      Prior Living Arrangements/Services Living arrangements for the past 2 months: Single Family Home Lives with:: Adult Children Patient language and need for interpreter reviewed:: Yes Do you feel safe going back to the place where you live?: Yes          Current home services: DME (shower bench) Criminal Activity/Legal Involvement Pertinent to Current Situation/Hospitalization: No - Comment as needed  Activities of Daily Living Home Assistive Devices/Equipment: Brace (specify type) (medical brace) ADL Screening (condition at time of admission) Patient's cognitive ability adequate to safely complete daily activities?: Yes Is the patient deaf or have difficulty hearing?: No Does the patient have difficulty seeing, even when wearing glasses/contacts?: No Does the patient have difficulty concentrating, remembering, or making decisions?: No Patient able to express need for assistance with ADLs?: Yes Does the patient have difficulty dressing or bathing?: No Independently performs ADLs?: Yes (appropriate for developmental age) Does the patient have difficulty walking  or climbing stairs?: No Weakness of Legs: Left Weakness of Arms/Hands: None  Permission Sought/Granted                  Emotional Assessment Appearance:: Appears stated age Attitude/Demeanor/Rapport: Engaged Affect (typically observed): Accepting Orientation: : Oriented to Self, Oriented to Place, Oriented to  Time, Oriented to Situation   Psych Involvement: No (comment)  Admission diagnosis:  Multiple neurological symptoms [R29.90] Acute alteration in mental status [R41.82] Patient Active Problem List   Diagnosis Date Noted   Multiple neurological symptoms 06/23/2021   Protein-calorie malnutrition, mild (HCC) 06/23/2021   Acute respiratory disease due to COVID-19 virus 10/25/2020   AKI (acute kidney injury) (HCC) 10/25/2020   Metabolic syndrome 10/21/2020   Constipation 07/09/2020   Subclinical hypothyroidism 05/11/2019   Prediabetes 05/11/2019   Vitamin D deficiency 05/11/2019   OSA (obstructive sleep apnea) 05/11/2019   Intractable chronic migraine without aura and with status migrainosus 01/14/2016   Risk for falls 09/02/2015   Insomnia 05/27/2015   Severe depressed bipolar II disorder without psychotic features (HCC) 02/25/2015   Class 3 severe obesity with serious comorbidity and body mass index (BMI) of 40.0 to 44.9 in adult (HCC) 11/14/2014   Severe 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 (HCC) 10/10/2014   GAD (generalized anxiety disorder) 10/10/2014   Panic disorder with agoraphobia 10/10/2014   Social anxiety disorder 10/10/2014   Jaw pain-acute TMJ 09/24/14 09/24/2014  Asthma 08/01/2014   Low back pain 07/24/2014   Encounter for monitoring opioid maintenance therapy 07/02/2014   Pharyngoesophageal dysphagia 06/24/2014   S/P gastric bypass 06/24/2014   Protein-calorie malnutrition, severe (HCC) 11/03/2012   Drug overdose, multiple drugs 11/01/2012   Hypokalemia 11/01/2012   Migraine 05/22/2012   Depression 11/22/2011   Chronic pain  11/22/2011   Graves' disease    GERD (gastroesophageal reflux disease)    Need for prophylactic postmenopausal hormone replacement therapy 11/03/2011   Allergic rhinitis 09/28/2011   Anemia 09/28/2011   Essential hypertension 09/28/2011   PCP:  Iona Hansen, NP Pharmacy:   [id number] - [place], [place] - [address] [place] [address] Phone: [phone] Fax: [phone]     Social Determinants of Health (SDOH) Interventions    Readmission Risk Interventions No flowsheet data found.

## 2021-06-24 NOTE — Progress Notes (Signed)
°  Transition of Care [facility]) Screening Note   Patient Details  Name: KEMARIA CRUNKLETON Date of Birth: [birth year]   Transition of Care [facility]) CM/SW Contact:    Kermit Balo, RN Phone Number: 06/24/2021, 10:24 AM    Transition of Care Department [facility]) has reviewed patient and no TOC needs have been identified at this time. We will continue to monitor patient advancement through interdisciplinary progression rounds. If new patient transition needs arise, please place a TOC consult.

## 2021-06-24 NOTE — Plan of Care (Signed)
°  Problem: Education: Goal: Knowledge of General Education information will improve Description: Including pain rating scale, medication(s)/side effects and non-pharmacologic comfort measures Outcome: Progressing   Problem: Health Behavior/Discharge Planning: Goal: Ability to manage health-related needs will improve Outcome: Progressing   Problem: Safety: Goal: Ability to remain free from injury will improve Outcome: Progressing   Problem: Education: Goal: Knowledge of disease or condition will improve Outcome: Progressing Goal: Knowledge of secondary prevention will improve (SELECT ALL) Outcome: Progressing Goal: Knowledge of patient specific risk factors will improve (INDIVIDUALIZE FOR PATIENT) Outcome: Progressing Goal: Individualized Educational Video(s) Outcome: Progressing   Problem: Self-Care: Goal: Verbalization of feelings and concerns over difficulty with self-care will improve Outcome: Progressing

## 2021-06-29 ENCOUNTER — Other Ambulatory Visit (INDEPENDENT_AMBULATORY_CARE_PROVIDER_SITE_OTHER): Payer: Self-pay | Admitting: Bariatrics

## 2021-06-29 DIAGNOSIS — F5089 Other specified eating disorder: Secondary | ICD-10-CM

## 2021-06-29 NOTE — Telephone Encounter (Signed)
Last OV with Dr Brown 

## 2021-07-02 ENCOUNTER — Encounter (HOSPITAL_BASED_OUTPATIENT_CLINIC_OR_DEPARTMENT_OTHER): Payer: Self-pay

## 2021-07-02 ENCOUNTER — Other Ambulatory Visit (HOSPITAL_COMMUNITY): Payer: Self-pay | Admitting: Psychiatry

## 2021-07-02 ENCOUNTER — Ambulatory Visit (HOSPITAL_BASED_OUTPATIENT_CLINIC_OR_DEPARTMENT_OTHER): Payer: Medicare Other | Admitting: Physical Therapy

## 2021-07-02 DIAGNOSIS — F3181 Bipolar II disorder: Secondary | ICD-10-CM

## 2021-07-03 ENCOUNTER — Other Ambulatory Visit: Payer: Self-pay

## 2021-07-03 ENCOUNTER — Ambulatory Visit (HOSPITAL_BASED_OUTPATIENT_CLINIC_OR_DEPARTMENT_OTHER): Payer: Medicare Other | Attending: Nurse Practitioner | Admitting: Physical Therapy

## 2021-07-03 ENCOUNTER — Encounter (HOSPITAL_BASED_OUTPATIENT_CLINIC_OR_DEPARTMENT_OTHER): Payer: Self-pay | Admitting: Physical Therapy

## 2021-07-03 DIAGNOSIS — M6281 Muscle weakness (generalized): Secondary | ICD-10-CM | POA: Insufficient documentation

## 2021-07-03 DIAGNOSIS — R293 Abnormal posture: Secondary | ICD-10-CM | POA: Insufficient documentation

## 2021-07-03 DIAGNOSIS — M25611 Stiffness of right shoulder, not elsewhere classified: Secondary | ICD-10-CM | POA: Insufficient documentation

## 2021-07-03 DIAGNOSIS — M25511 Pain in right shoulder: Secondary | ICD-10-CM | POA: Diagnosis not present

## 2021-07-03 NOTE — Therapy (Signed)
[facility] [address] [place], [place], [address] Phone: [phone]   Fax:  [phone]  Physical Therapy Treatment  Patient Details  Name: [name] MRN: [id number] Date of Birth: 15-Apr-1966 Referring Provider (PT): Eldridge Abrahams   Encounter Date: 07/03/2021   PT End of Session - 07/03/21 1127     Visit Number 6    Number of Visits 14    Date for PT Re-Evaluation 08/20/21    Authorization Type MCR and MCD    Authorization Time Period 06/02/21 to 06/10/2021; extended to 3/30    Progress Note Due on Visit 15    PT Start Time 1121    PT Stop Time 1205    PT Time Calculation (min) 44 min    Activity Tolerance Patient tolerated treatment well    Behavior During Therapy [name] for tasks assessed/performed             Past Medical History:  Diagnosis Date   Allergic rhinitis    Allergic to cats    pet dander   Allergy    Anemia    Anxiety    Arthritis 09/2018   both feet   Asthma    Back pain    Bipolar disorder ([place])    Cervicalgia    Chronic fatigue syndrome    Constipation    Depression    Diabetes mellitus without complication ([place])    "pre"   Dyspnea    Environmental allergies    Fibromyalgia    GERD (gastroesophageal reflux disease)    Grave's disease    Heart murmur    High cholesterol    History of blood clots    Hypertension    Joint pain    Lactose intolerance    Lower extremity edema    Multiple food allergies    OSA (obstructive sleep apnea)    Osteoporosis    Palpitations    Panic disorder    Rheumatoid arthritis ([place])    Risk for falls    Sciatica    Severe 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 ([place])    Sleep apnea    no cpap worn   Syncope    Syncope and collapse    Thyroid disease    Vasovagal syncope    Vertigo    Vitamin D deficiency     Past Surgical History:  Procedure Laterality Date   ABDOMINAL HYSTERECTOMY  2006   COLONOSCOPY     GASTRIC  BYPASS  2008    There were no vitals filed for this visit.   Subjective Assessment - 07/03/21 1139     Subjective Pt reports falling again this am getting into the tub.  Neck and UE pain as well as left knee pain.                                           PT Short Term Goals - 06/18/21 1130       PT SHORT TERM GOAL #1   Title Will be independent with appropriate HEP, to be updated PRN    Baseline 1/26- 3x/week due to holidays, planning wedding after holidays, planning birthdays, etc    Time 2    Period Weeks    Status On-going               PT Long Term Goals - 06/18/21 1132  PT LONG TERM GOAL #1   Title Pain will be no more than 7/10 at worst    Baseline 1/26- 8/10    Time 4    Period Weeks    Status On-going      PT LONG TERM GOAL #2   Title Cervical and thoracic mobility to improve by at least 50% in order to reduce pain and help improve posture    Baseline 1/26- still quite stiff    Time 4    Period Weeks    Status On-going      PT LONG TERM GOAL #3   Title R shoulder ROM to be equal to that of the L in order to improve function    Baseline 1/26- improved but still quite stiff    Time 4    Period Weeks    Status On-going      PT LONG TERM GOAL #4   Title MMT to have improved by at least 1 grade in all tested groups in order to assist in reducing pain and improving function    Baseline 1/26- some better/some worse    Time 4    Period Weeks    Status On-going             Pt seen for aquatic therapy today.  Treatment took place in water 3.25-4.8 ft in depth at the [facility] pool. Temp of water was 91.  Pt entered/exited the pool via stairs (step through pattern) independently with bilat rail.   Bad Ragaz:  Pt assisted into supine -trunk lateral elongation  -R/L side bending  engaging abdomnal (internal and external obliques)   -Emphasis on breathing techniques to draw in abdominals for support.  -  PT used Aquastretch techniques to decrease muscle tension in  left low back.  Posterior chain: hip extension (glut activation) resisted by ankle buoys x10 -resisted knee flex x10   Pt requires buoyancy for support and to offload joints with strengthening exercises. Viscosity of the water is needed for resistance of strengthening; water current perturbations provides challenge to standing balance unsupported, requiring increased core activation.        Plan - 07/03/21 1140     Clinical Impression Statement Pt reports falling into garden tub again.  She is instructed to discontinue use of tub due to the multiple falls she has had with its use.  She is reluctant to use her other bathroom due to her anxiety and the room being "small"  She VU that she can not improve if she continues to fall reinjuring herself and that she has to make better decisions due to her high fall risk. She responds well to Bad Ragaz technique used today. Stretched and gentle strengthening of le's.  She will benefit form continued aquatic therapy given she begins making safer choices at home and decreases her already high fall risk.    Personal Factors and Comorbidities Age;Fitness;Behavior Pattern;Past/Current Experience;Comorbidity 3+;Time since onset of injury/illness/exacerbation    Examination-Activity Limitations Bathing;Bed Mobility;Reach Overhead;Caring for Others;Sleep;Carry;Dressing;Hygiene/Grooming;Lift;Toileting    Examination-Participation Restrictions Meal Prep;Cleaning;Occupation;Community Activity;Driving;Medication Management;Dorita Sciara    Stability/Clinical Decision Making Evolving/Moderate complexity    Rehab Potential Fair    PT Frequency 2x / week    PT Treatment/Interventions ADLs/Self Care Home Management;Cryotherapy;Electrical Stimulation;Iontophoresis 4mg /ml Dexamethasone;Moist Heat;Ultrasound;Gait training;Balance training;Therapeutic exercise;Therapeutic activities;Functional mobility  training;Stair training;Patient/family education;Manual techniques;Vasopneumatic Device;Taping;Passive range of motion;Dry needling    PT Next Visit Plan Advance strengtheing as tolerted  Patient will benefit from skilled therapeutic intervention in order to improve the following deficits and impairments:  Decreased range of motion, Increased fascial restricitons, Increased muscle spasms, Impaired UE functional use, Decreased activity tolerance, Pain, Impaired flexibility, Decreased strength, Postural dysfunction, Decreased mobility  Visit Diagnosis: Acute pain of right shoulder  Stiffness of right shoulder, not elsewhere classified  Abnormal posture  Muscle weakness (generalized)     Problem List Patient Active Problem List   Diagnosis Date Noted   Multiple neurological symptoms 06/23/2021   Protein-calorie malnutrition, mild ([place]) 06/23/2021   Acute respiratory disease due to COVID-19 virus 10/25/2020   AKI (acute kidney injury) ([place]) 99991111   Metabolic syndrome 99991111   Constipation 07/09/2020   Subclinical hypothyroidism 05/11/2019   Prediabetes 05/11/2019   Vitamin D deficiency 05/11/2019   OSA (obstructive sleep apnea) 05/11/2019   Intractable chronic migraine without aura and with status migrainosus 01/14/2016   Risk for falls 09/02/2015   Insomnia 05/27/2015   Severe depressed bipolar II disorder without psychotic features ([place]) 02/25/2015   Class 3 severe obesity with serious comorbidity and body mass index (BMI) of 40.0 to 44.9 in adult ([place]) 11/14/2014   Severe 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 ([place]) 10/10/2014   GAD (generalized anxiety disorder) 10/10/2014   Panic disorder with agoraphobia 10/10/2014   Social anxiety disorder 10/10/2014   Jaw pain-acute TMJ 09/24/14 09/24/2014   Asthma 08/01/2014   Low back pain 07/24/2014   Encounter for monitoring opioid maintenance therapy 07/02/2014   Pharyngoesophageal  dysphagia 06/24/2014   S/P gastric bypass 06/24/2014   Protein-calorie malnutrition, severe ([place]) 11/03/2012   Drug overdose, multiple drugs 11/01/2012   Hypokalemia 11/01/2012   Migraine 05/22/2012   Depression 11/22/2011   Chronic pain 11/22/2011   Graves' disease    GERD (gastroesophageal reflux disease)    Need for prophylactic postmenopausal hormone replacement therapy 11/03/2011   Allergic rhinitis 09/28/2011   Anemia 09/28/2011   Essential hypertension 09/28/2011    Annamarie Major) Rossie Scarfone MPT 07/03/2021, 6:47 PM  [facility] [address] [place], [place], [address] Phone: [phone]   Fax:  [phone]  Name: [name] MRN: [id number] Date of Birth: 06/07/1965

## 2021-07-08 ENCOUNTER — Ambulatory Visit (HOSPITAL_BASED_OUTPATIENT_CLINIC_OR_DEPARTMENT_OTHER): Payer: Medicare Other | Admitting: Physical Therapy

## 2021-07-08 ENCOUNTER — Other Ambulatory Visit: Payer: Self-pay

## 2021-07-08 ENCOUNTER — Encounter (HOSPITAL_BASED_OUTPATIENT_CLINIC_OR_DEPARTMENT_OTHER): Payer: Self-pay | Admitting: Physical Therapy

## 2021-07-08 DIAGNOSIS — M6281 Muscle weakness (generalized): Secondary | ICD-10-CM

## 2021-07-08 DIAGNOSIS — M25611 Stiffness of right shoulder, not elsewhere classified: Secondary | ICD-10-CM

## 2021-07-08 DIAGNOSIS — M25511 Pain in right shoulder: Secondary | ICD-10-CM

## 2021-07-08 DIAGNOSIS — R293 Abnormal posture: Secondary | ICD-10-CM

## 2021-07-08 NOTE — Therapy (Signed)
[facility] [address] [place], [place], [address] Phone: [phone]   Fax:  [phone]  Physical Therapy Treatment  Patient Details  Name: [name] MRN: [id number] Date of Birth: 04-30-1966 Referring Provider (PT): Eldridge Abrahams   Encounter Date: 07/08/2021   PT End of Session - 07/08/21 1543     Visit Number 7    Number of Visits 14    Date for PT Re-Evaluation 08/20/21    Authorization Type MCR and MCD    Authorization Time Period 06/02/21 to 06/10/2021; extended to 3/30    PT Start Time 1500    PT Stop Time 1540    PT Time Calculation (min) 40 min             Past Medical History:  Diagnosis Date   Allergic rhinitis    Allergic to cats    pet dander   Allergy    Anemia    Anxiety    Arthritis 09/2018   both feet   Asthma    Back pain    Bipolar disorder ([place])    Cervicalgia    Chronic fatigue syndrome    Constipation    Depression    Diabetes mellitus without complication ([place])    "pre"   Dyspnea    Environmental allergies    Fibromyalgia    GERD (gastroesophageal reflux disease)    Grave's disease    Heart murmur    High cholesterol    History of blood clots    Hypertension    Joint pain    Lactose intolerance    Lower extremity edema    Multiple food allergies    OSA (obstructive sleep apnea)    Osteoporosis    Palpitations    Panic disorder    Rheumatoid arthritis ([place])    Risk for falls    Sciatica    Severe 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 ([place])    Sleep apnea    no cpap worn   Syncope    Syncope and collapse    Thyroid disease    Vasovagal syncope    Vertigo    Vitamin D deficiency     Past Surgical History:  Procedure Laterality Date   ABDOMINAL HYSTERECTOMY  2006   COLONOSCOPY     GASTRIC BYPASS  2008    There were no vitals filed for this visit.   Subjective Assessment - 07/08/21 1537     Subjective My left hand seems to  cramping more today worse than my right. Right arm ok. "I woke up last night and was confused as to where I was, I thought I was in the doctors office and that they left me there.  Called the emergency number and it took me about 2 minutes to realize I was at home.    Currently in Pain? Yes    Pain Score 1     Pain Orientation Right    Pain Onset More than a month ago    Pain Frequency Intermittent                                          PT Short Term Goals - 06/18/21 1130       PT SHORT TERM GOAL #1   Title Will be independent with appropriate HEP, to be updated PRN    Baseline 1/26- 3x/week  due to holidays, planning wedding after holidays, planning birthdays, etc    Time 2    Period Weeks    Status On-going               PT Long Term Goals - 06/18/21 1132       PT LONG TERM GOAL #1   Title Pain will be no more than 7/10 at worst    Baseline 1/26- 8/10    Time 4    Period Weeks    Status On-going      PT LONG TERM GOAL #2   Title Cervical and thoracic mobility to improve by at least 50% in order to reduce pain and help improve posture    Baseline 1/26- still quite stiff    Time 4    Period Weeks    Status On-going      PT LONG TERM GOAL #3   Title R shoulder ROM to be equal to that of the L in order to improve function    Baseline 1/26- improved but still quite stiff    Time 4    Period Weeks    Status On-going      PT LONG TERM GOAL #4   Title MMT to have improved by at least 1 grade in all tested groups in order to assist in reducing pain and improving function    Baseline 1/26- some better/some worse    Time 4    Period Weeks    Status On-going            Pt seen for aquatic therapy today.  Treatment took place in water 3.25-4.8 ft in depth at the [facility] pool. Temp of water was 91.  Pt entered/exited the pool via stairs step through pattern independently with bilat rail.   Walking 6 widths each fwd,  sidestepping and bwd in 3 ft  Seated: -flutter at hip 3 x20;  -add/abd 3x12   Step ups 3 ft fwd, sidestepping and backward 2x10 ea with unilateral ue support on hand rail. -cues for abdominal bracing for balance and control  Walking between all exercises for  recovery.  Cues for increased stride length.  No ue support.   Pt requires buoyancy for support and to offload joints with strengthening exercises. Viscosity of the water is needed for resistance of strengthening; water current perturbations provides challenge to standing balance unsupported, requiring increased core activation.          Plan - 07/08/21 1536     Clinical Impression Statement Pt with no reports of cervical pain today although right hand cramping.  She had an episode of confusion in the middle of last night which lasted reportedly for a few minutes.  She attributes it to not being on any anxiety meds. Pr moving very well today. No antalgic gait or guarding. No ue support needed with movment submerged. She seems relaxed and clear. Pt completes all exercises with improved strength and focus.  She reports bathing in smaller bathroom without difficulty using shower bench.  She will continue to benefit from aquatics with improving confidence which assist in improving strnegth, functional mobility and decrease fall risk.    Stability/Clinical Decision Making Evolving/Moderate complexity    Clinical Decision Making Moderate    Rehab Potential Fair    PT Frequency 2x / week    PT Duration 4 weeks    PT Treatment/Interventions ADLs/Self Care Home Management;Cryotherapy;Electrical Stimulation;Iontophoresis 4mg /ml Dexamethasone;Moist Heat;Ultrasound;Gait training;Balance training;Therapeutic exercise;Therapeutic activities;Functional mobility training;Stair training;Patient/family education;Manual  techniques;Vasopneumatic Device;Taping;Passive range of motion;Dry needling    PT Next Visit Plan Advance strengtheing as tolerted              Patient will benefit from skilled therapeutic intervention in order to improve the following deficits and impairments:  Decreased range of motion, Increased fascial restricitons, Increased muscle spasms, Impaired UE functional use, Decreased activity tolerance, Pain, Impaired flexibility, Decreased strength, Postural dysfunction, Decreased mobility  Visit Diagnosis: Acute pain of right shoulder  Stiffness of right shoulder, not elsewhere classified  Abnormal posture  Muscle weakness (generalized)     Problem List Patient Active Problem List   Diagnosis Date Noted   Multiple neurological symptoms 06/23/2021   Protein-calorie malnutrition, mild ([place]) 06/23/2021   Acute respiratory disease due to COVID-19 virus 10/25/2020   AKI (acute kidney injury) ([place]) 99991111   Metabolic syndrome 99991111   Constipation 07/09/2020   Subclinical hypothyroidism 05/11/2019   Prediabetes 05/11/2019   Vitamin D deficiency 05/11/2019   OSA (obstructive sleep apnea) 05/11/2019   Intractable chronic migraine without aura and with status migrainosus 01/14/2016   Risk for falls 09/02/2015   Insomnia 05/27/2015   Severe depressed bipolar II disorder without psychotic features ([place]) 02/25/2015   Class 3 severe obesity with serious comorbidity and body mass index (BMI) of 40.0 to 44.9 in adult ([place]) 11/14/2014   Severe 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 ([place]) 10/10/2014   GAD (generalized anxiety disorder) 10/10/2014   Panic disorder with agoraphobia 10/10/2014   Social anxiety disorder 10/10/2014   Jaw pain-acute TMJ 09/24/14 09/24/2014   Asthma 08/01/2014   Low back pain 07/24/2014   Encounter for monitoring opioid maintenance therapy 07/02/2014   Pharyngoesophageal dysphagia 06/24/2014   S/P gastric bypass 06/24/2014   Protein-calorie malnutrition, severe ([place]) 11/03/2012   Drug overdose, multiple drugs 11/01/2012   Hypokalemia 11/01/2012   Migraine  05/22/2012   Depression 11/22/2011   Chronic pain 11/22/2011   Graves' disease    GERD (gastroesophageal reflux disease)    Need for prophylactic postmenopausal hormone replacement therapy 11/03/2011   Allergic rhinitis 09/28/2011   Anemia 09/28/2011   Essential hypertension 09/28/2011    Annamarie Major) Rileyann Florance MPT 07/08/2021, 7:20 PM  [facility] [address] [place], [place], [address] Phone: [phone]   Fax:  [phone]  Name: KHADIJATOU WINTERS MRN: [id number] Date of Birth: January 13, 1966

## 2021-07-09 ENCOUNTER — Telehealth (HOSPITAL_COMMUNITY): Payer: Medicare Other | Admitting: Psychiatry

## 2021-07-09 ENCOUNTER — Telehealth (INDEPENDENT_AMBULATORY_CARE_PROVIDER_SITE_OTHER): Payer: Self-pay | Admitting: Family Medicine

## 2021-07-09 DIAGNOSIS — E1169 Type 2 diabetes mellitus with other specified complication: Secondary | ICD-10-CM

## 2021-07-09 MED ORDER — OZEMPIC (1 MG/DOSE) 4 MG/3ML ~~LOC~~ SOPN: 1.0000 mg | PEN_INJECTOR | SUBCUTANEOUS | 0 refills | Status: DC

## 2021-07-09 NOTE — Telephone Encounter (Signed)
Patient is completely out of Ozempic. Needs a refill asap today. Pt states that she has been waiting two weeks for a refill. Pt needs a call back today if possible. [facility].

## 2021-07-09 NOTE — Telephone Encounter (Signed)
RX sent

## 2021-07-10 ENCOUNTER — Telehealth: Payer: Self-pay | Admitting: Hematology

## 2021-07-10 NOTE — Telephone Encounter (Signed)
Scheduled appt per 2/17 referral. Pt is aware of appt date and time. Pt is aware to arrive 15 mins prior to appt time and to bring and updated insurance card. Pt is aware of appt location.   °

## 2021-07-14 ENCOUNTER — Ambulatory Visit (HOSPITAL_BASED_OUTPATIENT_CLINIC_OR_DEPARTMENT_OTHER): Payer: Medicare Other | Admitting: Physical Therapy

## 2021-07-15 ENCOUNTER — Other Ambulatory Visit: Payer: Self-pay

## 2021-07-15 ENCOUNTER — Ambulatory Visit (INDEPENDENT_AMBULATORY_CARE_PROVIDER_SITE_OTHER): Payer: Medicare Other | Admitting: Family Medicine

## 2021-07-15 ENCOUNTER — Encounter (INDEPENDENT_AMBULATORY_CARE_PROVIDER_SITE_OTHER): Payer: Self-pay | Admitting: Family Medicine

## 2021-07-15 VITALS — BP 107/73 | HR 90 | Temp 98.0°F | Ht 65.0 in | Wt 220.0 lb

## 2021-07-15 DIAGNOSIS — K5903 Drug induced constipation: Secondary | ICD-10-CM | POA: Diagnosis not present

## 2021-07-15 DIAGNOSIS — E669 Obesity, unspecified: Secondary | ICD-10-CM

## 2021-07-15 DIAGNOSIS — E1169 Type 2 diabetes mellitus with other specified complication: Secondary | ICD-10-CM | POA: Diagnosis not present

## 2021-07-15 DIAGNOSIS — Z6836 Body mass index (BMI) 36.0-36.9, adult: Secondary | ICD-10-CM

## 2021-07-15 DIAGNOSIS — Z7985 Long-term (current) use of injectable non-insulin antidiabetic drugs: Secondary | ICD-10-CM

## 2021-07-15 DIAGNOSIS — F3181 Bipolar II disorder: Secondary | ICD-10-CM

## 2021-07-15 MED ORDER — OZEMPIC (1 MG/DOSE) 4 MG/3ML ~~LOC~~ SOPN: 1.0000 mg | PEN_INJECTOR | SUBCUTANEOUS | 0 refills | Status: DC

## 2021-07-15 MED ORDER — LINACLOTIDE 290 MCG PO CAPS: 290.0000 ug | ORAL_CAPSULE | Freq: Every day | ORAL | 3 refills | Status: DC

## 2021-07-16 ENCOUNTER — Telehealth (HOSPITAL_BASED_OUTPATIENT_CLINIC_OR_DEPARTMENT_OTHER): Payer: Medicare Other | Admitting: Psychiatry

## 2021-07-16 DIAGNOSIS — F4001 Agoraphobia with panic disorder: Secondary | ICD-10-CM

## 2021-07-16 DIAGNOSIS — G4700 Insomnia, unspecified: Secondary | ICD-10-CM | POA: Diagnosis not present

## 2021-07-16 DIAGNOSIS — F3181 Bipolar II disorder: Secondary | ICD-10-CM | POA: Diagnosis not present

## 2021-07-16 DIAGNOSIS — F411 Generalized anxiety disorder: Secondary | ICD-10-CM

## 2021-07-16 DIAGNOSIS — F5089 Other specified eating disorder: Secondary | ICD-10-CM

## 2021-07-16 MED ORDER — BUPROPION HCL ER (XL) 150 MG PO TB24: 450.0000 mg | ORAL_TABLET | Freq: Every day | ORAL | 0 refills | Status: DC

## 2021-07-16 MED ORDER — DOXEPIN HCL 100 MG PO CAPS: 100.0000 mg | ORAL_CAPSULE | Freq: Every day | ORAL | 0 refills | Status: DC
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
## 2021-07-20 NOTE — Progress Notes (Signed)
Chief Complaint:   OBESITY [name] is here to discuss her progress with her obesity treatment plan along with follow-up of her obesity related diagnoses. See Medical Weight Management Flowsheet for complete bioelectrical impedance results.  Today's visit was #: 44 Starting weight: 250 lbs Starting date: 03/27/2019 Weight change since last visit: 15 lbs Total lbs lost to date: 30 lbs Total weight loss percentage to date: -12.00%  Nutrition Plan: Category 2 Plan for 0% of the time.  Activity: Walking/swimming for 30-45 minutes 2-4 times per week. Anti-obesity medications: Ozempic 1 mg subcutaneously weekly. Reported side effects: None.  Interim History: [name]'s goal is 180 pounds with muscle.  We reviewed recent [facility] hospitalization and medl changes.  She says she kicked her boyfriend out of her house and is much happier.   Assessment/Plan:   1. Drug-induced constipation Will refill Linzess 290 mcg daily, as per below.  - Refill linaclotide (LINZESS) 290 MCG CAPS capsule; Take 1 capsule (290 mcg total) by mouth daily before breakfast.  Dispense: 90 capsule; Refill: 3  2. Type 2 diabetes mellitus with other specified complication, without long-term current use of insulin (HCC) Diabetes Mellitus: At goal. Medication: Ozempic 1 mg subcutaneously weekly. Issues reviewed: blood sugar goals, complications of diabetes mellitus, hypoglycemia prevention and treatment, exercise, and nutrition.  Plan: Continue Ozempic 1 mg subcutaneously weekly.  The patient will continue to focus on protein-rich, low simple carbohydrate foods. We reviewed the importance of hydration, regular exercise for stress reduction, and restorative sleep.   Lab Results  Component Value Date   HGBA1C 5.1 07/09/2020   HGBA1C 5.6 12/20/2019   HGBA1C 5.5 06/27/2019   Lab Results  Component Value Date   LDLCALC 67 07/09/2020   CREATININE 1.00 06/24/2021   3. Bipolar 2 disorder ([place]) Leaning toward depression.   Followed by Dr. Doyne Keel for this.  We will continue to monitor symptoms as they relate to her weight loss journey.  4. Obesity, current BMI 36.7  Course: [name] is currently in the action stage of change. As such, her goal is to continue with weight loss efforts.   Nutrition goals: She has agreed to the Category 2 Plan.   Exercise goals:  As is.  Behavioral modification strategies: increasing lean protein intake, decreasing simple carbohydrates, and increasing vegetables.  [name] has agreed to follow-up with our clinic in 4 weeks. She was informed of the importance of frequent follow-up visits to maximize her success with intensive lifestyle modifications for her multiple health conditions.   Objective:   Blood pressure 107/73, pulse 90, temperature 98 F (36.7 C), temperature source Oral, height 5\' 5"  (1.651 m), weight 220 lb (99.8 kg), SpO2 100 %. Body mass index is 36.61 kg/m.  General: Cooperative, alert, well developed, in no acute distress. HEENT: Conjunctivae and lids unremarkable. Cardiovascular: Regular rhythm.  Lungs: Normal work of breathing. Neurologic: No focal deficits.   Lab Results  Component Value Date   CREATININE 1.00 06/24/2021   BUN 13 06/24/2021   NA 141 06/24/2021   K 3.5 06/24/2021   CL 110 06/24/2021   CO2 23 06/24/2021   Lab Results  Component Value Date   ALT 14 06/24/2021   AST 18 06/24/2021   ALKPHOS 51 06/24/2021   BILITOT 0.4 06/24/2021   Lab Results  Component Value Date   HGBA1C 5.1 07/09/2020   HGBA1C 5.6 12/20/2019   HGBA1C 5.5 06/27/2019   HGBA1C 5.9 (H) 03/27/2019   Lab Results  Component Value Date   INSULIN  8.8 07/09/2020   INSULIN 8.7 12/20/2019   INSULIN 7.9 03/27/2019   Lab Results  Component Value Date   TSH 4.415 06/23/2021   Lab Results  Component Value Date   CHOL 227 (H) 07/09/2020   HDL 142 07/09/2020   LDLCALC 67 07/09/2020   TRIG 92 10/25/2020   CHOLHDL 1.6 07/09/2020   Lab Results  Component  Value Date   VD25OH 17.9 (L) 07/09/2020   VD25OH 22.5 (L) 12/20/2019   VD25OH 20.1 (L) 06/27/2019   Lab Results  Component Value Date   WBC 7.5 06/22/2021   HGB 12.0 06/22/2021   HCT 37.8 06/22/2021   MCV 92.9 06/22/2021   PLT 272 06/22/2021   Lab Results  Component Value Date   IRON 106 07/09/2020   TIBC 299 07/09/2020   FERRITIN 66 10/25/2020   Attestation Statements:   Reviewed by clinician on day of visit: allergies, medications, problem list, medical history, surgical history, family history, social history, and previous encounter notes.  I, Water quality scientist, CMA, am acting as transcriptionist for Briscoe Deutscher, DO  I have reviewed the above documentation for accuracy and completeness, and I agree with the above. -  Briscoe Deutscher, DO, MS, FAAFP, DABOM - Family and Bariatric Medicine.

## 2021-07-21 ENCOUNTER — Other Ambulatory Visit: Payer: Self-pay

## 2021-07-21 ENCOUNTER — Ambulatory Visit (HOSPITAL_BASED_OUTPATIENT_CLINIC_OR_DEPARTMENT_OTHER): Payer: Medicare Other | Admitting: Physical Therapy

## 2021-07-21 ENCOUNTER — Ambulatory Visit (INDEPENDENT_AMBULATORY_CARE_PROVIDER_SITE_OTHER): Payer: Medicare Other | Admitting: Neurology

## 2021-07-21 DIAGNOSIS — M25511 Pain in right shoulder: Secondary | ICD-10-CM

## 2021-07-21 DIAGNOSIS — M6281 Muscle weakness (generalized): Secondary | ICD-10-CM

## 2021-07-21 DIAGNOSIS — G43711 Chronic migraine without aura, intractable, with status migrainosus: Secondary | ICD-10-CM | POA: Diagnosis not present

## 2021-07-21 DIAGNOSIS — R293 Abnormal posture: Secondary | ICD-10-CM

## 2021-07-21 DIAGNOSIS — M25611 Stiffness of right shoulder, not elsewhere classified: Secondary | ICD-10-CM

## 2021-07-21 NOTE — Progress Notes (Addendum)
Consent Form Botulism Toxin Injection For Chronic Migraine   07/21/2021: She gets > 70% relief migraine frequency, extremely anxious, do the botox fast, discard 45 units as below.  04/21/2021: stable, still doing same if not better > 60% improvement. She broke up with her boyfriend. Patient states she did not take the xanax, she is here alone, I warned her not to drive if she takes xanax or clonazepam and have someone drive her.   01/01/9146: getting back on track. Will give some xanax for next time she is incredibly stressed when she comes in, she is lovely, here with fiance  Interval history 04/28/2020: Doing great, More than 60% improvement in frequency but also in severity. Ajovy not approve using emgality instead. she has failed imitrex and maxalt and cannot take anymore triptans, contraindicated due to side effects, gave her some samples. She feels clenching is a problem and she has pain when clenching in the temples as well and we give her muscle relaxers since we cannot put botox there. She had HTN on aimovig, stopped. She had botox in the past and we restarted on 03/2019, doing great. Loves emgality as an add on as well. No date set for her wedding yet, it has been 2.5 years. She is very anxious and took anxiety medication today and did better but she is very anxious. She wants an outdoor wedding. She did much better, she took her anxiety and pain meds. [facility]    Reviewed orally with patient, additionally signature is on file:  Botulism toxin has been approved by the Federal drug administration for treatment of chronic migraine. Botulism toxin does not cure chronic migraine and it may not be effective in some patients.  The administration of botulism toxin is accomplished by injecting a small amount of toxin into the muscles of the neck and head. Dosage must be titrated for each individual. Any benefits resulting from botulism toxin tend  to wear off after 3 months with a repeat injection required if benefit is to be maintained. Injections are usually done every 3-4 months with maximum effect peak achieved by about 2 or 3 weeks. Botulism toxin is expensive and you should be sure of what costs you will incur resulting from the injection.  The side effects of botulism toxin use for chronic migraine may include:   -Transient, and usually mild, facial weakness with facial injections  -Transient, and usually mild, head or neck weakness with head/neck injections  -Reduction or loss of forehead facial animation due to forehead muscle weakness  -Eyelid drooping  -Dry eye  -Pain at the site of injection or bruising at the site of injection  -Double vision  -Potential unknown long term risks  Contraindications: You should not have Botox if you are pregnant, nursing, allergic to albumin, have an infection, skin condition, or muscle weakness at the site of the injection, or have myasthenia gravis, Lambert-Eaton syndrome, or ALS.  It is also possible that as with any injection, there may be an allergic reaction or no effect from the medication. Reduced effectiveness after repeated injections is sometimes seen and rarely infection at the injection site may occur. All care will be taken to prevent these side effects. If therapy is given over a long time, atrophy and wasting in the muscle injected may occur. Occasionally the patient's become refractory to treatment because they develop antibodies to the toxin. In this event, therapy needs to be modified.  I have  read the above information and consent to the administration of botulism toxin.    BOTOX PROCEDURE NOTE FOR MIGRAINE HEADACHE    Contraindications and precautions discussed with patient(above). Aseptic procedure was observed and patient tolerated procedure. Procedure performed by Dr. Artemio Alyoni Marionna Gonia  The condition has existed for more than 6 months, and pt does not have a diagnosis of  ALS, Myasthenia Gravis or Lambert-Eaton Syndrome.  Risks and benefits of injections discussed and pt agrees to proceed with the procedure.  Written consent obtained  These injections are medically necessary. Pt  receives good benefits from these injections. These injections do not cause sedations or hallucinations which the oral therapies may cause.  Description of procedure:  The patient was placed in a sitting position. The standard protocol was used for Botox as follows, with 5 units of Botox injected at each site:   -Procerus muscle, midline injection  -Corrugator muscle, bilateral injection  -Frontalis muscle, bilateral injection, with 2 sites each side, medial injection was performed in the upper one third of the frontalis muscle, in the region vertical from the medial inferior edge of the superior orbital rim. The lateral injection was again in the upper one third of the forehead vertically above the lateral limbus of the cornea, 1.5 cm lateral to the medial injection site.  -Temporalis muscle injection, 4 sites, bilaterally. The first injection was 3 cm above the tragus of the ear, second injection site was 1.5 cm to 3 cm up from the first injection site in line with the tragus of the ear. The third injection site was 1.5-3 cm forward between the first 2 injection sites. The fourth injection site was 1.5 cm posterior to the second injection site.   -Occipitalis muscle injection, 3 sites, bilaterally. The first injection was done one half way between the occipital protuberance and the tip of the mastoid process behind the ear. The second injection site was done lateral and superior to the first, 1 fingerbreadth from the first injection. The third injection site was 1 fingerbreadth superiorly and medially from the first injection site.  -Cervical paraspinal muscle injection, 2 sites, bilateral knee first injection site was 1 cm from the midline of the cervical spine, 3 cm inferior to the  lower border of the occipital protuberance. The second injection site was 1.5 cm superiorly and laterally to the first injection site.  -Trapezius muscle injection was performed at 3 sites, bilaterally. The first injection site was in the upper trapezius muscle halfway between the inflection point of the neck, and the acromion. The second injection site was one half way between the acromion and the first injection site. The third injection was done between the first injection site and the inflection point of the neck.   Will return for repeat injection in 3 months.   155 units of Botox was used, 45u Botox not injected was wasted. The patient tolerated the procedure well, there were no complications of the above procedure.

## 2021-07-21 NOTE — Therapy (Signed)
[facility] [address] [place], [place], [address] Phone: [phone]   Fax:  [phone]  Physical Therapy Treatment  Patient Details  Name: [name] MRN: [id number] Date of Birth: [birth year] Referring Provider (PT): Eldridge Abrahams   Encounter Date: 07/21/2021   PT End of Session - 07/21/21 1622     Visit Number 8    Number of Visits 14    Date for PT Re-Evaluation 08/20/21    Authorization Type MCR and MCD    Authorization Time Period 06/02/21 to 06/10/2021; extended to 3/30    PT Start Time 1621    PT Stop Time 1706    PT Time Calculation (min) 45 min    Activity Tolerance Patient tolerated treatment well    Behavior During Therapy [facility] for tasks assessed/performed             Past Medical History:  Diagnosis Date   Allergic rhinitis    Allergic to cats    pet dander   Allergy    Anemia    Anxiety    Arthritis 09/2018   both feet   Asthma    Back pain    Bipolar disorder ([place])    Cervicalgia    Chronic fatigue syndrome    Constipation    Depression    Diabetes mellitus without complication ([place])    "pre"   Dyspnea    Environmental allergies    Fibromyalgia    GERD (gastroesophageal reflux disease)    Grave's disease    Heart murmur    High cholesterol    History of blood clots    Hypertension    Joint pain    Lactose intolerance    Lower extremity edema    Multiple food allergies    OSA (obstructive sleep apnea)    Osteoporosis    Palpitations    Panic disorder    Rheumatoid arthritis ([place])    Risk for falls    Sciatica    Severe 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 ([place])    Sleep apnea    no cpap worn   Syncope    Syncope and collapse    Thyroid disease    Vasovagal syncope    Vertigo    Vitamin D deficiency     Past Surgical History:  Procedure Laterality Date   ABDOMINAL HYSTERECTOMY  2006   COLONOSCOPY     GASTRIC BYPASS  2008    There were no  vitals filed for this visit.   Subjective Assessment - 07/21/21 1637     Subjective "Right shoulder hurting feels like I have been punched.  need not to be sleeping on it.  I have an adjustable bed and I have been sleeping better.  Botox today for migranes she put 6 shots in my shoulders as well.  Shoulders started to feel better in about 10 min."  Pt reports having lost 11 lb    Currently in Pain? Yes    Pain Score 4     Pain Orientation Right    Pain Descriptors / Indicators Aching    Pain Type Chronic pain    Pain Onset More than a month ago    Pain Frequency Intermittent                                           PT Short  Term Goals - 06/18/21 1130       PT SHORT TERM GOAL #1   Title Will be independent with appropriate HEP, to be updated PRN    Baseline 1/26- 3x/week due to holidays, planning wedding after holidays, planning birthdays, etc    Time 2    Period Weeks    Status On-going               PT Long Term Goals - 06/18/21 1132       PT LONG TERM GOAL #1   Title Pain will be no more than 7/10 at worst    Baseline 1/26- 8/10    Time 4    Period Weeks    Status On-going      PT LONG TERM GOAL #2   Title Cervical and thoracic mobility to improve by at least 50% in order to reduce pain and help improve posture    Baseline 1/26- still quite stiff    Time 4    Period Weeks    Status On-going      PT LONG TERM GOAL #3   Title R shoulder ROM to be equal to that of the L in order to improve function    Baseline 1/26- improved but still quite stiff    Time 4    Period Weeks    Status On-going      PT LONG TERM GOAL #4   Title MMT to have improved by at least 1 grade in all tested groups in order to assist in reducing pain and improving function    Baseline 1/26- some better/some worse    Time 4    Period Weeks    Status On-going            Pt seen for aquatic therapy today.  Treatment took place in water 3.25-4.8 ft in  depth at the [facility] pool. Temp of water was 91.  Pt entered/exited the pool via stairs step through pattern independently with bilat rail.   Walking 6 widths each fwd, sidestepping and bwd in 3 ft  Seated: -flutter at hip 3 x20;  -add/abd 3x12 -STS x 10 seated on 4th step (from bottom)pushing up then x 4 without UE support   Step ups 3 ft fwd, sidestepping and backward 2x10 ea with unilateral ue support on hand rail. -cues for abdominal bracing for balance and control  Side lunges with yellow hand buoys 6 widths. VC and demonstration for proper execution   Balance: tandem stance with yellow hand buoys; progressed to lifting buoys out of water; progressed to add/abd x10  Walking between all exercises for  recovery.  Cues for increased stride length.  No ue support.   Pt requires buoyancy for support and to offload joints with strengthening exercises. Viscosity of the water is needed for resistance of strengthening; water current perturbations provides challenge to standing balance unsupported, requiring increased core activation.          Plan - 07/21/21 1640     Clinical Impression Statement Pt without much discomfort today.  She is in good spirits. Completes session with good motivation and excellent effort. She reports not using tub she has fallen out of since I recommended to stop and has had no falls. Added increased balance chllenges.  She demonstrates improving strength and toleration to activity as evidenced by completion of 45 minutes without recovery periods and without complaints of fatigue. Pt initiated many of the exercises and attempted to add difficulty to  them by decreasing BOS for balance benefit and increasing effort forincrease aerobic capacity challenge    Personal Factors and Comorbidities Age;Fitness;Behavior Pattern;Past/Current Experience;Comorbidity 3+;Time since onset of injury/illness/exacerbation    Examination-Activity Limitations Bathing;Bed  Mobility;Reach Overhead;Caring for Others;Sleep;Carry;Dressing;Hygiene/Grooming;Lift;Toileting    Examination-Participation Restrictions Meal Prep;Cleaning;Occupation;Community Activity;Driving;Medication Management;Dorita Sciara    Stability/Clinical Decision Making Evolving/Moderate complexity    PT Treatment/Interventions ADLs/Self Care Home Management;Cryotherapy;Electrical Stimulation;Iontophoresis 4mg /ml Dexamethasone;Moist Heat;Ultrasound;Gait training;Balance training;Therapeutic exercise;Therapeutic activities;Functional mobility training;Stair training;Patient/family education;Manual techniques;Vasopneumatic Device;Taping;Passive range of motion;Dry needling    PT Next Visit Plan Advance balance and aerobic capacity.              Patient will benefit from skilled therapeutic intervention in order to improve the following deficits and impairments:  Decreased range of motion, Increased fascial restricitons, Increased muscle spasms, Impaired UE functional use, Decreased activity tolerance, Pain, Impaired flexibility, Decreased strength, Postural dysfunction, Decreased mobility  Visit Diagnosis: Acute pain of right shoulder  Stiffness of right shoulder, not elsewhere classified  Abnormal posture  Muscle weakness (generalized)     Problem List Patient Active Problem List   Diagnosis Date Noted   Multiple neurological symptoms 06/23/2021   Protein-calorie malnutrition, mild ([place]) 06/23/2021   Acute respiratory disease due to COVID-19 virus 10/25/2020   AKI (acute kidney injury) ([place]) 99991111   Metabolic syndrome 99991111   Constipation 07/09/2020   Subclinical hypothyroidism 05/11/2019   Prediabetes 05/11/2019   Vitamin D deficiency 05/11/2019   OSA (obstructive sleep apnea) 05/11/2019   Intractable chronic migraine without aura and with status migrainosus 01/14/2016   Risk for falls 09/02/2015   Insomnia 05/27/2015   Severe depressed bipolar II disorder  without psychotic features ([place]) 02/25/2015   Class 3 severe obesity with serious comorbidity and body mass index (BMI) of 40.0 to 44.9 in adult ([place]) 11/14/2014   Severe 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 ([place]) 10/10/2014   GAD (generalized anxiety disorder) 10/10/2014   Panic disorder with agoraphobia 10/10/2014   Social anxiety disorder 10/10/2014   Jaw pain-acute TMJ 09/24/14 09/24/2014   Asthma 08/01/2014   Low back pain 07/24/2014   Encounter for monitoring opioid maintenance therapy 07/02/2014   Pharyngoesophageal dysphagia 06/24/2014   S/P gastric bypass 06/24/2014   Protein-calorie malnutrition, severe ([place]) 11/03/2012   Drug overdose, multiple drugs 11/01/2012   Hypokalemia 11/01/2012   Migraine 05/22/2012   Depression 11/22/2011   Chronic pain 11/22/2011   Graves' disease    GERD (gastroesophageal reflux disease)    Need for prophylactic postmenopausal hormone replacement therapy 11/03/2011   Allergic rhinitis 09/28/2011   Anemia 09/28/2011   Essential hypertension 09/28/2011    Annamarie Major) Katy Brickell MPT 07/21/2021, 5:13 PM  [facility] [address] [place], [place], [address] Phone: [phone]   Fax:  [phone]  Name: [name] MRN: [id number] Date of Birth: [birth year]

## 2021-07-21 NOTE — Progress Notes (Signed)
Botox- 200 units x 1 vial °Lot: [id number] °Expiration: 08/2023 °NDC: 0023-3921-02 ° °Bacteriostatic 0.9% Sodium Chloride- 4mL total °Lot: GL [id number] °Expiration: 12/23/2022 °NDC: 0409-1966-02 ° °Dx: G43.711 °B/B ° °

## 2021-07-25 NOTE — Progress Notes (Signed)
This is part of the stroke work-up.  Please see neurology team for any further questions

## 2021-07-28 ENCOUNTER — Ambulatory Visit (HOSPITAL_BASED_OUTPATIENT_CLINIC_OR_DEPARTMENT_OTHER): Payer: Medicare Other | Admitting: Physical Therapy

## 2021-07-28 ENCOUNTER — Other Ambulatory Visit (INDEPENDENT_AMBULATORY_CARE_PROVIDER_SITE_OTHER): Payer: Self-pay | Admitting: Bariatrics

## 2021-07-28 DIAGNOSIS — R948 Abnormal results of function studies of other organs and systems: Secondary | ICD-10-CM

## 2021-07-29 NOTE — Telephone Encounter (Signed)
Dr.Wallace °

## 2021-07-31 ENCOUNTER — Other Ambulatory Visit (HOSPITAL_COMMUNITY): Payer: Self-pay | Admitting: Psychiatry

## 2021-07-31 DIAGNOSIS — F3181 Bipolar II disorder: Secondary | ICD-10-CM

## 2021-08-03 ENCOUNTER — Inpatient Hospital Stay: Payer: Medicare Other

## 2021-08-03 ENCOUNTER — Other Ambulatory Visit (HOSPITAL_COMMUNITY): Payer: Self-pay | Admitting: Psychiatry

## 2021-08-03 ENCOUNTER — Inpatient Hospital Stay: Payer: Medicare Other | Attending: Hematology | Admitting: Hematology

## 2021-08-03 ENCOUNTER — Other Ambulatory Visit: Payer: Self-pay

## 2021-08-03 VITALS — BP 103/72 | HR 89 | Temp 97.2°F | Resp 18 | Wt 225.4 lb

## 2021-08-03 DIAGNOSIS — F319 Bipolar disorder, unspecified: Secondary | ICD-10-CM | POA: Insufficient documentation

## 2021-08-03 DIAGNOSIS — R233 Spontaneous ecchymoses: Secondary | ICD-10-CM

## 2021-08-03 DIAGNOSIS — M25611 Stiffness of right shoulder, not elsewhere classified: Secondary | ICD-10-CM | POA: Insufficient documentation

## 2021-08-03 DIAGNOSIS — R293 Abnormal posture: Secondary | ICD-10-CM | POA: Diagnosis not present

## 2021-08-03 DIAGNOSIS — M6281 Muscle weakness (generalized): Secondary | ICD-10-CM | POA: Diagnosis not present

## 2021-08-03 DIAGNOSIS — E119 Type 2 diabetes mellitus without complications: Secondary | ICD-10-CM | POA: Insufficient documentation

## 2021-08-03 DIAGNOSIS — K219 Gastro-esophageal reflux disease without esophagitis: Secondary | ICD-10-CM | POA: Diagnosis not present

## 2021-08-03 DIAGNOSIS — F3181 Bipolar II disorder: Secondary | ICD-10-CM

## 2021-08-03 DIAGNOSIS — M069 Rheumatoid arthritis, unspecified: Secondary | ICD-10-CM | POA: Diagnosis not present

## 2021-08-03 DIAGNOSIS — I10 Essential (primary) hypertension: Secondary | ICD-10-CM | POA: Diagnosis not present

## 2021-08-03 DIAGNOSIS — F419 Anxiety disorder, unspecified: Secondary | ICD-10-CM

## 2021-08-03 DIAGNOSIS — F4001 Agoraphobia with panic disorder: Secondary | ICD-10-CM

## 2021-08-03 DIAGNOSIS — Z79899 Other long term (current) drug therapy: Secondary | ICD-10-CM | POA: Diagnosis not present

## 2021-08-03 DIAGNOSIS — F411 Generalized anxiety disorder: Secondary | ICD-10-CM

## 2021-08-03 DIAGNOSIS — M25511 Pain in right shoulder: Secondary | ICD-10-CM | POA: Diagnosis not present

## 2021-08-03 LAB — CBC WITH DIFFERENTIAL/PLATELET
Abs Immature Granulocytes: 0.02 10*3/uL (ref 0.00–0.07)
Basophils Absolute: 0 10*3/uL (ref 0.0–0.1)
Basophils Relative: 0 %
Eosinophils Absolute: 0.1 10*3/uL (ref 0.0–0.5)
Eosinophils Relative: 1 %
HCT: 41.4 % (ref 36.0–46.0)
Hemoglobin: 12.9 g/dL (ref 12.0–15.0)
Immature Granulocytes: 0 %
Lymphocytes Relative: 16 %
Lymphs Abs: 0.9 10*3/uL (ref 0.7–4.0)
MCH: 28.2 pg (ref 26.0–34.0)
MCHC: 31.2 g/dL (ref 30.0–36.0)
MCV: 90.4 fL (ref 80.0–100.0)
Monocytes Absolute: 0.9 10*3/uL (ref 0.1–1.0)
Monocytes Relative: 16 %
Neutro Abs: 3.9 10*3/uL (ref 1.7–7.7)
Neutrophils Relative %: 67 %
Platelets: 229 10*3/uL (ref 150–400)
RBC: 4.58 MIL/uL (ref 3.87–5.11)
RDW: 13.3 % (ref 11.5–15.5)
WBC: 5.9 10*3/uL (ref 4.0–10.5)
nRBC: 0 % (ref 0.0–0.2)

## 2021-08-03 LAB — PLATELET FUNCTION ASSAY: Collagen / Epinephrine: 110 seconds (ref 0–193)

## 2021-08-03 LAB — CMP (CANCER CENTER ONLY)
ALT: 14 U/L (ref 0–44)
AST: 12 U/L — ABNORMAL LOW (ref 15–41)
Albumin: 4.4 g/dL (ref 3.5–5.0)
Alkaline Phosphatase: 81 U/L (ref 38–126)
Anion gap: 9 (ref 5–15)
BUN: 19 mg/dL (ref 6–20)
CO2: 27 mmol/L (ref 22–32)
Calcium: 9.6 mg/dL (ref 8.9–10.3)
Chloride: 104 mmol/L (ref 98–111)
Creatinine: 0.96 mg/dL (ref 0.44–1.00)
GFR, Estimated: >60 mL/min (ref >60)
Glucose, Bld: 77 mg/dL (ref 70–99)
Potassium: 3.7 mmol/L (ref 3.5–5.1)
Sodium: 140 mmol/L (ref 135–145)
Total Bilirubin: 0.3 mg/dL (ref 0.3–1.2)
Total Protein: 7.9 g/dL (ref 6.5–8.1)

## 2021-08-03 LAB — APTT: aPTT: 31 seconds (ref 24–36)

## 2021-08-03 LAB — PROTIME-INR
INR: 1 (ref 0.8–1.2)
Prothrombin Time: 13.5 seconds (ref 11.4–15.2)

## 2021-08-03 LAB — FIBRINOGEN: Fibrinogen: 517 mg/dL — ABNORMAL HIGH (ref 210–475)

## 2021-08-03 NOTE — Progress Notes (Unsigned)
[facility]   HEMATOLOGY/ONCOLOGY CONSULTATION NOTE  Date of Service: 08/03/2021  Patient Care Team: Berkley Harvey, NP as PCP - General (Nurse Practitioner)  CHIEF COMPLAINTS/PURPOSE OF CONSULTATION:  Easy Bruisability  HISTORY OF PRESENTING ILLNESS:  [name] is a wonderful 56 y.o. female who has been referred to [name] by Dr Merryl Hacker for evaluation and management of *** Epistaxis x 2 -- minimal Bruising especially on lower extremities  MEDICAL HISTORY:  Past Medical History:  Diagnosis Date   Allergic rhinitis    Allergic to cats    pet dander   Allergy    Anemia    Anxiety    Arthritis 09/2018   both feet   Asthma    Back pain    Bipolar disorder ([place])    Cervicalgia    Chronic fatigue syndrome    Constipation    Depression    Diabetes mellitus without complication ([place])    "pre"   Dyspnea    Environmental allergies    Fibromyalgia    GERD (gastroesophageal reflux disease)    Grave's disease    Heart murmur    High cholesterol    History of blood clots    Hypertension    Joint pain    Lactose intolerance    Lower extremity edema    Multiple food allergies    OSA (obstructive sleep apnea)    Osteoporosis    Palpitations    Panic disorder    Rheumatoid arthritis ([place])    Risk for falls    Sciatica    Severe 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 ([place])    Sleep apnea    no cpap worn   Syncope    Syncope and collapse    Thyroid disease    Vasovagal syncope    Vertigo    Vitamin D deficiency     SURGICAL HISTORY: Past Surgical History:  Procedure Laterality Date   ABDOMINAL HYSTERECTOMY  2006   COLONOSCOPY     GASTRIC BYPASS  2008    SOCIAL HISTORY: Social History   Socioeconomic History   Marital status: Divorced    Spouse name: Not on file   Number of children: 2   Years of education: 14   Highest education level: Not on file  Occupational History   Occupation: Disabled  Tobacco Use   Smoking status: Never   Smokeless  tobacco: Never  Vaping Use   Vaping Use: Never used  Substance and Sexual Activity   Alcohol use: Not Currently   Drug use: No   Sexual activity: Not on file  Other Topics Concern   Not on file  Social History Narrative   Lives alone. Caffeine use: sometimes per patient.   Social Determinants of Health   Financial Resource Strain: Not on file  Food Insecurity: Not on file  Transportation Needs: Not on file  Physical Activity: Not on file  Stress: Not on file  Social Connections: Not on file  Intimate Partner Violence: Not on file    FAMILY HISTORY: Family History  Problem Relation Age of Onset   Hypertension Mother    Cirrhosis Mother    Alcohol abuse Mother    Depression Mother    Physical abuse Mother    Hyperlipidemia Mother    Thyroid disease Mother    Anxiety disorder Mother    Hypertension Sister    Alcohol abuse Sister    Drug abuse Sister    Depression Sister    Anxiety disorder Sister  OCD Sister    Hypertension Father    Alcohol abuse Father    Hyperlipidemia Father    Depression Father    Drug abuse Father    ADD / ADHD Other    Depression Sister    Diabetes Other    Hypertension Other    Diabetes Maternal Uncle    Seizures Cousin    Dementia Neg Hx    Colon cancer Neg Hx    Esophageal cancer Neg Hx    Rectal cancer Neg Hx    Stomach cancer Neg Hx     ALLERGIES:  is allergic to bee venom, coconut fatty acids, mushroom ext cmplx(shiitake-reishi-mait), nutritional supplements, other, shellfish allergy, strawberry extract, hydrocodone-acetaminophen, latex, and hydrocodone-acetaminophen.  MEDICATIONS:  Current Outpatient Medications  Medication Sig Dispense Refill   albuterol (VENTOLIN HFA) 108 (90 Base) MCG/ACT inhaler Inhale 2 puffs into the lungs every 6 (six) hours as needed for wheezing. 18 g 2   aspirin EC 81 MG tablet Take 81 mg by mouth daily. Swallow whole.     azelastine (ASTELIN) 0.1 % nasal spray Place into both nostrils 2 (two)  times daily. Use in each nostril as directed     benzonatate (TESSALON) 100 MG capsule Take 100 mg by mouth 3 (three) times daily as needed.     budesonide-formoterol (SYMBICORT) 80-4.5 MCG/ACT inhaler Inhale 2 puffs into the lungs 2 (two) times daily as needed (wheezing/sob).      buPROPion (WELLBUTRIN XL) 150 MG 24 hr tablet Take 3 tablets (450 mg total) by mouth daily. 270 tablet 0   cetirizine (ZYRTEC) 10 MG tablet Take 10 mg by mouth daily.     cyclobenzaprine (FLEXERIL) 10 MG tablet      diclofenac sodium (VOLTAREN) 1 % GEL Apply 2 g topically 4 (four) times daily. 100 g 1   divalproex (DEPAKOTE ER) 500 MG 24 hr tablet TAKE 3 TABLETS (1,500 MG TOTAL) BY MOUTH DAILY 90 tablet 0   doxepin (SINEQUAN) 100 MG capsule Take 1 capsule (100 mg total) by mouth at bedtime. 30 capsule 0   doxycycline (VIBRA-TABS) 100 MG tablet      estradiol (ESTRACE) 0.5 MG tablet Take 0.5 mg by mouth daily.     fluticasone (FLONASE) 50 MCG/ACT nasal spray PLACE 1 SPRAY INTO BOTH NOSTRILS AS NEEDED (ALLERGIES/RUNNY NOSE). 16 mL 0   Galcanezumab-gnlm (EMGALITY) 120 MG/ML SOAJ INJECT 120 MG INTO THE SKIN EVERY 30 DAYS 3 mL 11   hydrochlorothiazide (MICROZIDE) 12.5 MG capsule TAKE 1 CAPSULE (12.5 MG TOTAL) BY MOUTH DAILY AS NEEDED (EDEMA). 30 capsule 0   lidocaine (XYLOCAINE) 2 % solution      linaclotide (LINZESS) 290 MCG CAPS capsule Take 1 capsule (290 mcg total) by mouth daily before breakfast. 90 capsule 3   meloxicam (MOBIC) 15 MG tablet Take 15 mg by mouth daily.     MOVANTIK 25 MG TABS tablet Take 25 mg by mouth daily.     Multiple Vitamin (MULTIVITAMIN) tablet Take 1 tablet by mouth daily.     phentermine 15 MG capsule Take by mouth.     tapentadol HCl (NUCYNTA) 75 MG tablet Take 75 mg by mouth every 8 (eight) hours as needed.     Thiamine HCl (VITAMIN B-1 PO) Take 1 tablet by mouth daily.      Vitamin D, Ergocalciferol, (DRISDOL) 1.25 MG (50000 UNIT) CAPS capsule Take 1 capsule (50,000 Units total) by mouth  every 7 (seven) days. 12 capsule 0   No current facility-administered medications  for this visit.    REVIEW OF SYSTEMS:    10 Point review of Systems was done is negative except as noted above.  PHYSICAL EXAMINATION: ECOG PERFORMANCE STATUS: {CHL ONC ECOG [id number]  . Vitals:   08/03/21 1245  BP: 103/72  Pulse: 89  Resp: 18  Temp: (!) 97.2 F (36.2 C)  SpO2: 98%   Filed Weights   08/03/21 1245  Weight: 225 lb 6 oz (102.2 kg)   .Body mass index is 37.5 kg/m.  GENERAL:alert, in no acute distress and comfortable SKIN: no acute rashes, no significant lesions EYES: conjunctiva are pink and non-injected, sclera anicteric OROPHARYNX: MMM, no exudates, no oropharyngeal erythema or ulceration NECK: supple, no JVD LYMPH:  no palpable lymphadenopathy in the cervical, axillary or inguinal regions LUNGS: clear to auscultation b/l with normal respiratory effort HEART: regular rate & rhythm ABDOMEN:  normoactive bowel sounds , non tender, not distended. Extremity: no pedal edema PSYCH: alert & oriented x 3 with fluent speech NEURO: no focal motor/sensory deficits  LABORATORY DATA:  I have reviewed the data as listed  . CBC Latest Ref Rng & Units 06/22/2021 10/28/2020 10/27/2020  WBC 4.0 - 10.5 K/uL 7.5 9.2 8.6  Hemoglobin 12.0 - 15.0 g/dL 12.0 10.7(L) 11.0(L)  Hematocrit 36.0 - 46.0 % 37.8 34.1(L) 34.9(L)  Platelets 150 - 400 K/uL 272 220 227    . CMP Latest Ref Rng & Units 06/24/2021 06/23/2021 10/28/2020  Glucose 70 - 99 mg/dL 98 99 158(H)  BUN 6 - 20 mg/dL 13 21(H) 16  Creatinine 0.44 - 1.00 mg/dL 1.00 0.88 0.79  Sodium 135 - 145 mmol/L 141 139 139  Potassium 3.5 - 5.1 mmol/L 3.5 3.4(L) 4.0  Chloride 98 - 111 mmol/L 110 110 102  CO2 22 - 32 mmol/L 23 22 27   Calcium 8.9 - 10.3 mg/dL 8.7(L) 8.4(L) 8.4(L)  Total Protein 6.5 - 8.1 g/dL 5.9(L) 6.3(L) 6.7  Total Bilirubin 0.3 - 1.2 mg/dL 0.4 0.6 0.3  Alkaline Phos 38 - 126 U/L 51 52 44  AST 15 - 41 U/L 18 22 26   ALT 0 - 44  U/L 14 14 22      RADIOGRAPHIC STUDIES: I have personally reviewed the radiological images as listed and agreed with the findings in the report. No results found.  ASSESSMENT & PLAN:  ***  All of the patients questions were answered with apparent satisfaction. The patient knows to call the clinic with any problems, questions or concerns.  I spent {CHL ONC TIME VISIT - [id number] counseling the patient face to face. The total time spent in the appointment was {CHL ONC TIME VISIT - [id number] and more than 50% was on counseling and direct patient cares.    Sullivan Lone MD [employer] AAHIVMS [facility] Hematology/Oncology Physician [facility]  (Office):       [phone] (Work cell):  [phone] (Fax):           [phone]  08/03/2021 1:12 PM

## 2021-08-04 ENCOUNTER — Ambulatory Visit (HOSPITAL_BASED_OUTPATIENT_CLINIC_OR_DEPARTMENT_OTHER): Payer: Medicare Other | Admitting: Physical Therapy

## 2021-08-04 LAB — COAG STUDIES INTERP REPORT

## 2021-08-04 LAB — VON WILLEBRAND PANEL
Coagulation Factor VIII: 264 % — ABNORMAL HIGH (ref 56–140)
Ristocetin Co-factor, Plasma: 251 % — ABNORMAL HIGH (ref 50–200)
Von Willebrand Antigen, Plasma: 256 % — ABNORMAL HIGH (ref 50–200)

## 2021-08-05 ENCOUNTER — Telehealth: Payer: Self-pay | Admitting: Hematology

## 2021-08-05 NOTE — Telephone Encounter (Signed)
Left message with follow-up appointment per 3/13 los. ?

## 2021-08-06 LAB — VITAMIN C: Vitamin C: 1.2 mg/dL (ref 0.4–2.0)

## 2021-08-11 ENCOUNTER — Ambulatory Visit (HOSPITAL_BASED_OUTPATIENT_CLINIC_OR_DEPARTMENT_OTHER): Payer: Medicare Other | Admitting: Physical Therapy

## 2021-08-11 ENCOUNTER — Encounter (HOSPITAL_BASED_OUTPATIENT_CLINIC_OR_DEPARTMENT_OTHER): Payer: Self-pay | Admitting: Physical Therapy

## 2021-08-11 ENCOUNTER — Other Ambulatory Visit: Payer: Self-pay

## 2021-08-11 DIAGNOSIS — R293 Abnormal posture: Secondary | ICD-10-CM | POA: Insufficient documentation

## 2021-08-11 DIAGNOSIS — M25511 Pain in right shoulder: Secondary | ICD-10-CM | POA: Insufficient documentation

## 2021-08-11 DIAGNOSIS — M25611 Stiffness of right shoulder, not elsewhere classified: Secondary | ICD-10-CM | POA: Insufficient documentation

## 2021-08-11 DIAGNOSIS — R233 Spontaneous ecchymoses: Secondary | ICD-10-CM | POA: Diagnosis not present

## 2021-08-11 DIAGNOSIS — M6281 Muscle weakness (generalized): Secondary | ICD-10-CM | POA: Insufficient documentation
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
## 2021-08-12 ENCOUNTER — Ambulatory Visit (INDEPENDENT_AMBULATORY_CARE_PROVIDER_SITE_OTHER): Payer: Medicare Other | Admitting: Family Medicine

## 2021-08-12 ENCOUNTER — Encounter (INDEPENDENT_AMBULATORY_CARE_PROVIDER_SITE_OTHER): Payer: Self-pay | Admitting: Family Medicine

## 2021-08-12 VITALS — BP 104/72 | HR 59 | Temp 98.0°F | Ht 65.0 in | Wt 219.0 lb

## 2021-08-12 DIAGNOSIS — E669 Obesity, unspecified: Secondary | ICD-10-CM

## 2021-08-12 DIAGNOSIS — K5903 Drug induced constipation: Secondary | ICD-10-CM

## 2021-08-12 DIAGNOSIS — R632 Polyphagia: Secondary | ICD-10-CM | POA: Diagnosis not present

## 2021-08-12 DIAGNOSIS — Z6836 Body mass index (BMI) 36.0-36.9, adult: Secondary | ICD-10-CM

## 2021-08-12 DIAGNOSIS — F3181 Bipolar II disorder: Secondary | ICD-10-CM

## 2021-08-12 MED ORDER — PHENTERMINE HCL 15 MG PO CAPS: 15.0000 mg | ORAL_CAPSULE | ORAL | 0 refills | Status: DC

## 2021-08-13 ENCOUNTER — Telehealth (HOSPITAL_BASED_OUTPATIENT_CLINIC_OR_DEPARTMENT_OTHER): Payer: Medicare Other | Admitting: Psychiatry

## 2021-08-13 ENCOUNTER — Other Ambulatory Visit: Payer: Self-pay

## 2021-08-13 DIAGNOSIS — G4700 Insomnia, unspecified: Secondary | ICD-10-CM | POA: Diagnosis not present

## 2021-08-13 DIAGNOSIS — F411 Generalized anxiety disorder: Secondary | ICD-10-CM | POA: Diagnosis not present

## 2021-08-13 DIAGNOSIS — F3181 Bipolar II disorder: Secondary | ICD-10-CM | POA: Diagnosis not present

## 2021-08-13 DIAGNOSIS — F4001 Agoraphobia with panic disorder: Secondary | ICD-10-CM | POA: Diagnosis not present

## 2021-08-13 MED ORDER — DOXEPIN HCL 100 MG PO CAPS: 100.0000 mg | ORAL_CAPSULE | Freq: Every day | ORAL | 0 refills | Status: DC

## 2021-08-13 MED ORDER — DIVALPROEX SODIUM ER 500 MG PO TB24: 1500.0000 mg | ORAL_TABLET | Freq: Every day | ORAL | 0 refills | Status: DC

## 2021-08-13 NOTE — Progress Notes (Signed)
Virtual Visit via Video Note ? ?I connected with [name] on 08/13/21 at  2:00 PM EDT by a video enabled telemedicine application and verified that I am speaking with the correct person using two identifiers. ? ?Location: ?Patient: home ?Provider: office ?  ?I discussed the limitations of evaluation and management by telemedicine and the availability of in person appointments. The patient expressed understanding and agreed to proceed. ? ?History of Present Illness: ?[name] is no longer having hallucinations. She has fallen twice in the last 2 weeks. One was at the movie theatre and thinks she missed a step. The other was trying to get into the bathtub. She is now using another bathroom to shower. She has been off the Prazosin for about 1 month. She denies nightmares. [name] is sleeping about 5-6 hours most nights. It takes her a few hours to fall asleep most nights. She still feels tired and forces herself to get up and do things. She has been doing swim classes twice/week with a friend. Her motivation to do things outside the house is really low and she often feels like she pretends to be happy when with others. It takes a great deal of effort to do things like cook or laundry. Her appetite is poor and she eats some fruit and protein shakes most days. She still feels depressed about 3-4 days/week. [name] denies SI/HI. She has not noticed any worsening of mood since our last visit. Her anxiety spikes when she goes to the small bathroom or when around others. She does not like to get of the car so tries to do things thru the drive thru. [name] has panic attacks if someone is coming over or when she is out in public.  Her negative self talk is slowly improving. She denies any manic or hypomanic like symptoms.  ?  ?Observations/Objective: ?Psychiatric Specialty Exam: ?ROS  ?There were no vitals taken for this visit.There is no height or weight on file to calculate BMI.  ?General Appearance: Casual and Fairly  Groomed  ?Eye Contact:  Good  ?Speech:  Clear and Coherent and Normal Rate  ?Volume:  Normal  ?Mood:  Anxious and Depressed  ?Affect:  Full Range  ?Thought Process:  Goal Directed, Linear, and Descriptions of Associations: Intact  ?Orientation:  Full (Time, Place, and Person)  ?Thought Content:  Logical  ?Suicidal Thoughts:  No  ?Homicidal Thoughts:  No  ?Memory:  Immediate;   Good  ?Judgement:  Poor  ?Insight:  Present  ?Psychomotor Activity:  Normal  ?Concentration:  Concentration: Good  ?Recall:  Good  ?Fund of Knowledge:  Good  ?Language:  Good  ?Akathisia:  No  ?Handed:  Right  ?AIMS (if indicated):     ?Assets:  Communication Skills ?Desire for Improvement ?Financial Resources/Insurance ?Housing ?Resilience ?Social Support ?Talents/Skills ?Transportation ?Vocational/Educational  ?ADL's:  Intact  ?Cognition:  WNL  ?Sleep:     ? ? ? ?Assessment and Plan: ? ?D/C Wellbutrin as it could be decreasing her appetite. She was only taking it every other day. [name] is only eating protein shakes and fruit most days. She has lost 6 lbs recently and that could be due to her swim class.  ? ?She is still taking Depakote and Doxepin. ? ?[name] is very worried about weight gain and states it contributes to her depression ? ?1. Bipolar II disorder (HCC) ?- divalproex (DEPAKOTE ER) 500 MG 24 hr tablet; Take 3 tablets (1,500 mg total) by mouth daily.  Dispense: 270 tablet; Refill: 0 ?-  doxepin (SINEQUAN) 100 MG capsule; Take 1 capsule (100 mg total) by mouth at bedtime.  Dispense: 90 capsule; Refill: 0 ? ?2. Panic disorder with agoraphobia ?- doxepin (SINEQUAN) 100 MG capsule; Take 1 capsule (100 mg total) by mouth at bedtime.  Dispense: 90 capsule; Refill: 0 ? ?3. GAD (generalized anxiety disorder) ?- doxepin (SINEQUAN) 100 MG capsule; Take 1 capsule (100 mg total) by mouth at bedtime.  Dispense: 90 capsule; Refill: 0 ? ?4. Insomnia, unspecified type ?- doxepin (SINEQUAN) 100 MG capsule; Take 1 capsule (100 mg total) by mouth  at bedtime.  Dispense: 90 capsule; Refill: 0 ? ? ? ?Follow Up Instructions: ?In 1-2 months or sooner if needed ?  ? ?Collaboration of Care: Other none today ? ?Patient/Guardian was advised Release of Information must be obtained prior to any record release in order to collaborate their care with an outside provider. Patient/Guardian was advised if they have not already done so to contact the registration department to sign all necessary forms in order for [name] to release information regarding their care.  ? ?Consent: Patient/Guardian gives verbal consent for treatment and assignment of benefits for services provided during this visit. Patient/Guardian expressed understanding and agreed to proceed.  ? ?I discussed the assessment and treatment plan with the patient. The patient was provided an opportunity to ask questions and all were answered. The patient agreed with the plan and demonstrated an understanding of the instructions. ?  ?The patient was advised to call back or seek an in-person evaluation if the symptoms worsen or if the condition fails to improve as anticipated. ? ?I provided 16 minutes of non-face-to-face time during this encounter. ? ? ?Oletta Darter, MD ? ?

## 2021-08-18 ENCOUNTER — Ambulatory Visit (HOSPITAL_BASED_OUTPATIENT_CLINIC_OR_DEPARTMENT_OTHER): Payer: Medicare Other | Admitting: Physical Therapy

## 2021-08-18 ENCOUNTER — Encounter (HOSPITAL_BASED_OUTPATIENT_CLINIC_OR_DEPARTMENT_OTHER): Payer: Self-pay | Admitting: Physical Therapy

## 2021-08-18 ENCOUNTER — Other Ambulatory Visit: Payer: Self-pay

## 2021-08-18 ENCOUNTER — Inpatient Hospital Stay (HOSPITAL_BASED_OUTPATIENT_CLINIC_OR_DEPARTMENT_OTHER): Payer: Medicare Other | Admitting: Hematology

## 2021-08-18 DIAGNOSIS — R233 Spontaneous ecchymoses: Secondary | ICD-10-CM

## 2021-08-18 DIAGNOSIS — M25611 Stiffness of right shoulder, not elsewhere classified: Secondary | ICD-10-CM

## 2021-08-18 DIAGNOSIS — M6281 Muscle weakness (generalized): Secondary | ICD-10-CM

## 2021-08-18 DIAGNOSIS — M25511 Pain in right shoulder: Secondary | ICD-10-CM

## 2021-08-18 DIAGNOSIS — R293 Abnormal posture: Secondary | ICD-10-CM

## 2021-08-18 NOTE — Therapy (Signed)
[place] ?[address] ?[place], [place], [address] ?Phone: [phone]   Fax:  [phone] ? ?Physical Therapy Treatment ? ?Patient Details  ?Name: [name] ?MRN: [id number] ?Date of Birth: [birth year] ?Referring Provider (PT): Eldridge Abrahams ? ? ?Encounter Date: 08/18/2021 ? ? PT End of Session - 08/18/21 1628   ? ? Visit Number 10   ? Number of Visits 14   ? Date for PT Re-Evaluation 08/20/21   ? Authorization Type MCR and MCD   ? Authorization Time Period 06/02/21 to 06/10/2021; extended to 3/30   ? PT Start Time 1620   ? PT Stop Time 1700   ? PT Time Calculation (min) 40 min   ? Activity Tolerance Patient tolerated treatment well   ? Behavior During Therapy [facility] for tasks assessed/performed   ? ?  ?  ? ?  ? ? ?Past Medical History:  ?Diagnosis Date  ? Allergic rhinitis   ? Allergic to cats   ? pet dander  ? Allergy   ? Anemia   ? Anxiety   ? Arthritis 09/2018  ? both feet  ? Asthma   ? Back pain   ? Bipolar disorder ([place])   ? Cervicalgia   ? Chronic fatigue syndrome   ? Constipation   ? Depression   ? Diabetes mellitus without complication ([place])   ? "pre"  ? Dyspnea   ? Environmental allergies   ? Fibromyalgia   ? GERD (gastroesophageal reflux disease)   ? Grave's disease   ? Heart murmur   ? High cholesterol   ? History of blood clots   ? Hypertension   ? Joint pain   ? Lactose intolerance   ? Lower extremity edema   ? Multiple food allergies   ? OSA (obstructive sleep apnea)   ? Osteoporosis   ? Palpitations   ? Panic disorder   ? Rheumatoid arthritis ([place])   ? Risk for falls   ? Sciatica   ? Severe 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 ([place])   ? Sleep apnea   ? no cpap worn  ? Syncope   ? Syncope and collapse   ? Thyroid disease   ? Vasovagal syncope   ? Vertigo   ? Vitamin D deficiency   ? ? ?Past Surgical History:  ?Procedure Laterality Date  ? ABDOMINAL HYSTERECTOMY  2006  ? COLONOSCOPY    ? GASTRIC BYPASS  2008  ? ? ?There were no  vitals filed for this visit. ? ? ? ? ? ? ? ? ? ? ? ? ? ? ? ? ? ? ? ? ? ? ? ? ? ? ? ? ? ? ? ? ? ? ? PT Short Term Goals - 06/18/21 1130   ? ?  ? PT SHORT TERM GOAL #1  ? Title Will be independent with appropriate HEP, to be updated PRN   ? Baseline 1/26- 3x/week due to holidays, planning wedding after holidays, planning birthdays, etc   ? Time 2   ? Period Weeks   ? Status On-going   ? ?  ?  ? ?  ? ? ? ? PT Long Term Goals - 06/18/21 1132   ? ?  ? PT LONG TERM GOAL #1  ? Title Pain will be no more than 7/10 at worst   ? Baseline 1/26- 8/10   ? Time 4   ? Period Weeks   ? Status On-going   ?  ?  PT LONG TERM GOAL #2  ? Title Cervical and thoracic mobility to improve by at least 50% in order to reduce pain and help improve posture   ? Baseline 1/26- still quite stiff   ? Time 4   ? Period Weeks   ? Status On-going   ?  ? PT LONG TERM GOAL #3  ? Title R shoulder ROM to be equal to that of the L in order to improve function   ? Baseline 1/26- improved but still quite stiff   ? Time 4   ? Period Weeks   ? Status On-going   ?  ? PT LONG TERM GOAL #4  ? Title MMT to have improved by at least 1 grade in all tested groups in order to assist in reducing pain and improving function   ? Baseline 1/26- some better/some worse   ? Time 4   ? Period Weeks   ? Status On-going   ? ?  ?  ? ?  ? ?Pt seen for aquatic therapy today.  Treatment took place in water 3.25-4.8 ft in depth at the [employer] [facility] pool. Temp of water was 91?.  Pt entered/exited the pool via stairs step through pattern independently with bilat rail. ?  ?Walking 6 widths each fwd, sidestepping and bwd in 3 ft. ?-all directions completed again with yellow hand buoys submerged for core engagement ? ?Seated: ?-flutter at hip 3 x20;  ?-add/abd 3x12 ?-STS x 10 seated on 3th step (from bottom) without UE support, then Cues for balance and core engagement ? ?Standing hip flex; extension; add/abd; TR;HR; marching x 10. (Referring to HEP throughout to ensure  understanding) ? ?Core strengtheing: yellow hand buoy push down with core stabilization 3x20 reps. ?Gentle lumbar rotation x 20 ?  ?Walking between exercises for recovery. ? ? ? Pt requires buoyancy for support and to offload joints with strengthening exercises. Viscosity of the water is needed for resistance of strengthening; water current perturbations provides challenge to standing balance unsupported, requiring increased core activation. ?   ? ? ? ? ? ? Plan - 08/18/21 1633   ? ? Clinical Impression Statement Low pain in lle and LB 2/10.  Pt recieved laminated HEP.  She is instrcted through it to ensure understanding for indep completion.  Pt has been attending water aerobic classes at the [facility] 1 x weekly.  She is encouraged to continue as tolerated.  She completes session without difficulty.  Overall she has progressed well with aquatic therapy improving strength  and toleration to activity as evidenced by completion of entire sessions, increased reps and increased exercises.  She has reached her max potential in setting.  Will return to on land PT for potential DC.   ? Personal Factors and Comorbidities Age;Fitness;Behavior Pattern;Past/Current Experience;Comorbidity 3+;Time since onset of injury/illness/exacerbation   ? Examination-Activity Limitations Bathing;Bed Mobility;Reach Overhead;Caring for Others;Sleep;Carry;Dressing;Hygiene/Grooming;Lift;Toileting   ? Examination-Participation Restrictions Meal Prep;Cleaning;Occupation;Community Activity;Driving;Medication Management;Yard Work;Laundry   ? Stability/Clinical Decision Making Evolving/Moderate complexity   ? Clinical Decision Making Moderate   ? Rehab Potential Fair   ? PT Frequency 2x / week   ? PT Duration 4 weeks   ? PT Treatment/Interventions ADLs/Self Care Home Management;Cryotherapy;Electrical Stimulation;Iontophoresis 4mg /ml Dexamethasone;Moist Heat;Ultrasound;Gait training;Balance training;Therapeutic exercise;Therapeutic activities;Functional  mobility training;Stair training;Patient/family education;Manual techniques;Vasopneumatic Device;Taping;Passive range of motion;Dry needling   ? PT Next Visit Plan On land assessment.   ? PT Home Exercise Plan Added to:  Access Code: [email or web address]  Forward Walking  - 1 x daily - 7  x weekly - 3 sets - 10 reps  - Side Stepping  - 1 x daily - 7 x weekly - 3 sets - 10 reps  - Backward Walking  -   ? ?  ?  ? ?  ? ? ? ? ? ?Patient will benefit from skilled therapeutic intervention in order to improve the following deficits and impairments:  Decreased range of motion, Increased fascial restricitons, Increased muscle spasms, Impaired UE functional use, Decreased activity tolerance, Pain, Impaired flexibility, Decreased strength, Postural dysfunction, Decreased mobility ? ?Visit Diagnosis: ?Abnormal posture ? ?Muscle weakness (generalized) ? ?Acute pain of right shoulder ? ?Stiffness of right shoulder, not elsewhere classified ? ? ? ? ?Problem List ?Patient Active Problem List  ? Diagnosis Date Noted  ? Multiple neurological symptoms 06/23/2021  ? Protein-calorie malnutrition, mild ([place]) 06/23/2021  ? Acute respiratory disease due to COVID-19 virus 10/25/2020  ? AKI (acute kidney injury) ([place]) 10/25/2020  ? Metabolic syndrome 99991111  ? Constipation 07/09/2020  ? Subclinical hypothyroidism 05/11/2019  ? Prediabetes 05/11/2019  ? Vitamin D deficiency 05/11/2019  ? OSA (obstructive sleep apnea) 05/11/2019  ? Intractable chronic migraine without aura and with status migrainosus 01/14/2016  ? Risk for falls 09/02/2015  ? Insomnia 05/27/2015  ? Severe depressed bipolar II disorder without psychotic features ([place]) 02/25/2015  ? Class 3 severe obesity with serious comorbidity and body mass index (BMI) of 40.0 to 44.9 in adult [facility]) 11/14/2014  ? Severe 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 ([place]) 10/10/2014  ? GAD (generalized anxiety disorder) 10/10/2014  ? Panic disorder with agoraphobia 10/10/2014  ? Social  anxiety disorder 10/10/2014  ? Jaw pain-acute TMJ 09/24/14 09/24/2014  ? Asthma 08/01/2014  ? Low back pain 07/24/2014  ? Encounter for monitoring opioid maintenance therapy 07/02/2014  ? Pharyngoesophageal dysphag

## 2021-08-18 NOTE — Progress Notes (Signed)
. ? ? ?HEMATOLOGY/ONCOLOGY PHONE VISIT NOTE ? ?Date of Service: 08/18/2021 ? ?Patient Care Team: ?Iona Hansen, NP as PCP - General (Nurse Practitioner) ? ?CHIEF COMPLAINTS/PURPOSE OF CONSULTATION:  ?Review labs for Easy Bruisability to rule our bleeding disorder ? ?HISTORY OF PRESENTING ILLNESS:  ? ?I connected with  [name] on 08/18/21 by a telephone visit and verified that I am speaking with the correct person using two identifiers. ?  ?I discussed the limitations of evaluation and management by telemedicine. The patient expressed understanding and agreed to proceed. ? ?[name] is a wonderful 56 y.o. female who has been referred to [name] by Dr .Iona Hansen, NP to review labs for easy bruisability to rule out bleeding disorder. She reports that she is doing well with no new symptoms or concerns. ? ?We discussed and reviewed labs in detail. ? ?We discussed maintaining good skin hydration/moisturization and considering using compression garments when she is physically active to provide better support to her blood vessels in the skin. ? ?Patient is on chronic meloxicam use on a daily basis which we discussed can increase bruising. ? ?No other new acute or focal symptoms. ? ?MEDICAL HISTORY:  ?Past Medical History:  ?Diagnosis Date  ? Allergic rhinitis   ? Allergic to cats   ? pet dander  ? Allergy   ? Anemia   ? Anxiety   ? Arthritis 09/2018  ? both feet  ? Asthma   ? Back pain   ? Bipolar disorder (HCC)   ? Cervicalgia   ? Chronic fatigue syndrome   ? Constipation   ? Depression   ? Diabetes mellitus without complication (HCC)   ? "pre"  ? Dyspnea   ? Environmental allergies   ? Fibromyalgia   ? GERD (gastroesophageal reflux disease)   ? Grave's disease   ? Heart murmur   ? High cholesterol   ? History of blood clots   ? Hypertension   ? Joint pain   ? Lactose intolerance   ? Lower extremity edema   ? Multiple food allergies   ? OSA (obstructive sleep apnea)   ? Osteoporosis   ? Palpitations   ?  Panic disorder   ? Rheumatoid arthritis (HCC)   ? Risk for falls   ? Sciatica   ? Severe 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 (HCC)   ? Sleep apnea   ? no cpap worn  ? Syncope   ? Syncope and collapse   ? Thyroid disease   ? Vasovagal syncope   ? Vertigo   ? Vitamin D deficiency   ? ? ?SURGICAL HISTORY: ?Past Surgical History:  ?Procedure Laterality Date  ? ABDOMINAL HYSTERECTOMY  2006  ? COLONOSCOPY    ? GASTRIC BYPASS  2008  ? ? ?SOCIAL HISTORY: ?Social History  ? ?Socioeconomic History  ? Marital status: Divorced  ?  Spouse name: Not on file  ? Number of children: 2  ? Years of education: 84  ? Highest education level: Not on file  ?Occupational History  ? Occupation: Disabled  ?Tobacco Use  ? Smoking status: Never  ? Smokeless tobacco: Never  ?Vaping Use  ? Vaping Use: Never used  ?Substance and Sexual Activity  ? Alcohol use: Not Currently  ? Drug use: No  ? Sexual activity: Not on file  ?Other Topics Concern  ? Not on file  ?Social History Narrative  ? Lives alone. Caffeine use: sometimes per patient.  ? ?Social Determinants of  Health  ? ?Financial Resource Strain: Not on file  ?Food Insecurity: Not on file  ?Transportation Needs: Not on file  ?Physical Activity: Not on file  ?Stress: Not on file  ?Social Connections: Not on file  ?Intimate Partner Violence: Not on file  ? ? ?FAMILY HISTORY: ?Family History  ?Problem Relation Age of Onset  ? Hypertension Mother   ? Cirrhosis Mother   ? Alcohol abuse Mother   ? Depression Mother   ? Physical abuse Mother   ? Hyperlipidemia Mother   ? Thyroid disease Mother   ? Anxiety disorder Mother   ? Hypertension Sister   ? Alcohol abuse Sister   ? Drug abuse Sister   ? Depression Sister   ? Anxiety disorder Sister   ? OCD Sister   ? Hypertension Father   ? Alcohol abuse Father   ? Hyperlipidemia Father   ? Depression Father   ? Drug abuse Father   ? ADD / ADHD Other   ? Depression Sister   ? Diabetes Other   ? Hypertension Other   ? Diabetes Maternal  Uncle   ? Seizures Cousin   ? Dementia Neg Hx   ? Colon cancer Neg Hx   ? Esophageal cancer Neg Hx   ? Rectal cancer Neg Hx   ? Stomach cancer Neg Hx   ? ? ?ALLERGIES:  is allergic to bee venom, coconut fatty acids, mushroom ext cmplx(shiitake-reishi-mait), nutritional supplements, other, shellfish allergy, strawberry extract, hydrocodone-acetaminophen, latex, and hydrocodone-acetaminophen. ? ?MEDICATIONS:  ?Current Outpatient Medications  ?Medication Sig Dispense Refill  ? albuterol (VENTOLIN HFA) 108 (90 Base) MCG/ACT inhaler Inhale 2 puffs into the lungs every 6 (six) hours as needed for wheezing. 18 g 2  ? aspirin EC 81 MG tablet Take 81 mg by mouth daily. Swallow whole.    ? azelastine (ASTELIN) 0.1 % nasal spray Place into both nostrils 2 (two) times daily. Use in each nostril as directed    ? budesonide-formoterol (SYMBICORT) 80-4.5 MCG/ACT inhaler Inhale 2 puffs into the lungs 2 (two) times daily as needed (wheezing/sob).     ? cetirizine (ZYRTEC) 10 MG tablet Take 10 mg by mouth daily.    ? cyclobenzaprine (FLEXERIL) 10 MG tablet     ? diclofenac sodium (VOLTAREN) 1 % GEL Apply 2 g topically 4 (four) times daily. 100 g 1  ? divalproex (DEPAKOTE ER) 500 MG 24 hr tablet Take 3 tablets (1,500 mg total) by mouth daily. 270 tablet 0  ? doxepin (SINEQUAN) 100 MG capsule Take 1 capsule (100 mg total) by mouth at bedtime. 90 capsule 0  ? doxycycline (VIBRA-TABS) 100 MG tablet     ? estradiol (ESTRACE) 0.5 MG tablet Take 0.5 mg by mouth daily.    ? fluticasone (FLONASE) 50 MCG/ACT nasal spray PLACE 1 SPRAY INTO BOTH NOSTRILS AS NEEDED (ALLERGIES/RUNNY NOSE). 16 mL 0  ? Galcanezumab-gnlm (EMGALITY) 120 MG/ML SOAJ INJECT 120 MG INTO THE SKIN EVERY 30 DAYS 3 mL 11  ? hydrochlorothiazide (MICROZIDE) 12.5 MG capsule TAKE 1 CAPSULE (12.5 MG TOTAL) BY MOUTH DAILY AS NEEDED (EDEMA). 30 capsule 0  ? lidocaine (XYLOCAINE) 2 % solution     ? linaclotide (LINZESS) 290 MCG CAPS capsule Take 1 capsule (290 mcg total) by mouth  daily before breakfast. 90 capsule 3  ? meloxicam (MOBIC) 15 MG tablet Take 15 mg by mouth daily.    ? MOVANTIK 25 MG TABS tablet Take 25 mg by mouth daily.    ? Multiple Vitamin (MULTIVITAMIN)  tablet Take 1 tablet by mouth daily.    ? phentermine 15 MG capsule Take 1 capsule (15 mg total) by mouth every morning. 90 capsule 0  ? tapentadol HCl (NUCYNTA) 75 MG tablet Take 75 mg by mouth every 8 (eight) hours as needed.    ? Thiamine HCl (VITAMIN B-1 PO) Take 1 tablet by mouth daily.     ? Vitamin D, Ergocalciferol, (DRISDOL) 1.25 MG (50000 UNIT) CAPS capsule Take 1 capsule (50,000 Units total) by mouth every 7 (seven) days. 12 capsule 0  ? ?No current facility-administered medications for this visit.  ? ? ?REVIEW OF SYSTEMS:   ? ?10 Point review of Systems was done is negative except as noted above. ? ?PHYSICAL EXAMINATION: ?Telehealth appointment. ? ?LABORATORY DATA:  ?I have reviewed the data as listed ? ?. ? ?  Latest Ref Rng & Units 08/03/2021  ?  1:52 PM 06/22/2021  ? 11:01 PM 10/28/2020  ?  4:01 AM  ?CBC  ?WBC 4.0 - 10.5 K/uL 5.9   7.5   9.2    ?Hemoglobin 12.0 - 15.0 g/dL 16.1   09.6   04.5    ?Hematocrit 36.0 - 46.0 % 41.4   37.8   34.1    ?Platelets 150 - 400 K/uL 229   272   220    ? ? ?. ? ?  Latest Ref Rng & Units 08/03/2021  ?  1:52 PM 06/24/2021  ?  4:26 AM 06/23/2021  ? 12:43 AM  ?CMP  ?Glucose 70 - 99 mg/dL 77   98   99    ?BUN 6 - 20 mg/dL ?Creatinine 0.44 - 1.00 mg/dL 4.09   8.11   9.14    ?Sodium 135 - 145 mmol/L 140   141   139    ?Potassium 3.5 - 5.1 mmol/L 3.7   3.5   3.4    ?Chloride 98 - 111 mmol/L 104   110   110    ?CO2 22 - 32 mmol/L ?Calcium 8.9 - 10.3 mg/dL 9.6   8.7   8.4    ?Total Protein 6.5 - 8.1 g/dL 7.9   5.9   6.3    ?Total Bilirubin 0.3 - 1.2 mg/dL 0.3   0.4   0.6    ?Alkaline Phos 38 - 126 U/L 81   51   52    ?AST 15 - 41 U/L ?ALT 0 - 44 U/L ? ?Component ?    Latest Ref Rng 08/03/2021  ?Collagen / ADP ?    0 - 118 seconds NOT  NEEDED   ?PFA Interpretation --   ?Collagen / Epinephrine ?    0 - 193 seconds 110   ?Coagulation Factor VIII ?    56 - 140 % 264 (H)   ?Ristocetin Co-factor, Plasma ?    50 - 200 % 251 (H)   ?Von Gunnar Bulla

## 2021-08-20 ENCOUNTER — Ambulatory Visit: Payer: Medicare Other | Admitting: Physical Therapy

## 2021-08-21 NOTE — Progress Notes (Signed)
Chief Complaint:   OBESITY [name] is here to discuss her progress with her obesity treatment plan along with follow-up of her obesity related diagnoses. See Medical Weight Management Flowsheet for complete bioelectrical impedance results.  Today's visit was #: 51 Starting weight: 250 lbs Starting date: 03/27/2019 Weight change since last visit: 1 lb Total lbs lost to date: 31 lbs Total weight loss percentage to date: -12.40%  Nutrition Plan: Category 2 Plan for 0% of the time.  Activity: Water aerobics for 45 minutes 2 times per week. Anti-obesity medications: phentermine 15 mg daily. Reported side effects: None.  Interim History: [name] says she is feeling "wonderful" today!  Assessment/Plan:   1. Polyphagia Controlled. Current treatment: phentermine 15 mg daily.    Plan: Continue phentermine 15 mg daily.  Will refill today.  She will continue to focus on protein-rich, low simple carbohydrate foods. We reviewed the importance of hydration, regular exercise for stress reduction, and restorative sleep.  - Refill phentermine 15 MG capsule; Take 1 capsule (15 mg total) by mouth every morning.  Dispense: 90 capsule; Refill: 0  Having again reminded the patient of the "off label" use of Phentermine beyond three consecutive months, and again discussing the risks, benefits, contraindications, and limitations of it's use; given it's role in the successful treatment of obesity thus far and lack of adverse effect, patient has expressed desire and given informed verbal consent to continue use.   I have consulted the [place] Controlled Substances Registry for this patient, and feel the risk/benefit ratio today is favorable for proceeding with this prescription for a controlled substance. The patient understands monitoring parameters and red flags.   2. Drug-induced constipation [name] is taking Linzess 290 mcg daily for constipation.  Plan:  The current medical regimen is effective;   continue present plan and medications.  3. Bipolar 2 disorder ([place]) Followed by Dr. Doyne Keel for this.  We will continue to monitor symptoms as they relate to her weight loss journey.  4. Obesity, current BMI 36.5  - Refill phentermine 15 MG capsule; Take 1 capsule (15 mg total) by mouth every morning.  Dispense: 90 capsule; Refill: 0  Course: [name] is currently in the action stage of change. As such, her goal is to continue with weight loss efforts.   Nutrition goals: She has agreed to the Category 2 Plan.   Exercise goals:  As is.  Behavioral modification strategies: increasing lean protein intake, decreasing simple carbohydrates, and increasing vegetables.  [name] has agreed to follow-up with our clinic in 6 weeks. She was informed of the importance of frequent follow-up visits to maximize her success with intensive lifestyle modifications for her multiple health conditions.   Objective:   Blood pressure 104/72, pulse (!) 59, temperature 98 F (36.7 C), temperature source Oral, height 5\' 5"  (1.651 m), weight 219 lb (99.3 kg), SpO2 98 %. Body mass index is 36.44 kg/m.  General: Cooperative, alert, well developed, in no acute distress. HEENT: Conjunctivae and lids unremarkable. Cardiovascular: Regular rhythm.  Lungs: Normal work of breathing. Neurologic: No focal deficits.   Lab Results  Component Value Date   CREATININE 0.96 08/03/2021   BUN 19 08/03/2021   NA 140 08/03/2021   K 3.7 08/03/2021   CL 104 08/03/2021   CO2 27 08/03/2021   Lab Results  Component Value Date   ALT 14 08/03/2021   AST 12 (L) 08/03/2021   ALKPHOS 81 08/03/2021   BILITOT 0.3 08/03/2021   Lab Results  Component Value  Date   HGBA1C 5.1 07/09/2020   HGBA1C 5.6 12/20/2019   HGBA1C 5.5 06/27/2019   HGBA1C 5.9 (H) 03/27/2019   Lab Results  Component Value Date   INSULIN 8.8 07/09/2020   INSULIN 8.7 12/20/2019   INSULIN 7.9 03/27/2019   Lab Results  Component Value Date   TSH 4.415  06/23/2021   Lab Results  Component Value Date   CHOL 227 (H) 07/09/2020   HDL 142 07/09/2020   LDLCALC 67 07/09/2020   TRIG 92 10/25/2020   CHOLHDL 1.6 07/09/2020   Lab Results  Component Value Date   VD25OH 17.9 (L) 07/09/2020   VD25OH 22.5 (L) 12/20/2019   VD25OH 20.1 (L) 06/27/2019   Lab Results  Component Value Date   WBC 5.9 08/03/2021   HGB 12.9 08/03/2021   HCT 41.4 08/03/2021   MCV 90.4 08/03/2021   PLT 229 08/03/2021   Lab Results  Component Value Date   IRON 106 07/09/2020   TIBC 299 07/09/2020   FERRITIN 66 10/25/2020   Attestation Statements:   Reviewed by clinician on day of visit: allergies, medications, problem list, medical history, surgical history, family history, social history, and previous encounter notes.  I, Water quality scientist, CMA, am acting as transcriptionist for Briscoe Deutscher, DO  I have reviewed the above documentation for accuracy and completeness, and I agree with the above. -  Briscoe Deutscher, DO, MS, FAAFP, DABOM - Family and Bariatric Medicine.

## 2021-08-28 ENCOUNTER — Other Ambulatory Visit (HOSPITAL_COMMUNITY): Payer: Self-pay | Admitting: Psychiatry

## 2021-08-28 ENCOUNTER — Other Ambulatory Visit (INDEPENDENT_AMBULATORY_CARE_PROVIDER_SITE_OTHER): Payer: Self-pay | Admitting: Bariatrics

## 2021-08-28 DIAGNOSIS — F3181 Bipolar II disorder: Secondary | ICD-10-CM

## 2021-08-28 DIAGNOSIS — F5089 Other specified eating disorder: Secondary | ICD-10-CM

## 2021-08-28 DIAGNOSIS — R600 Localized edema: Secondary | ICD-10-CM

## 2021-08-28 DIAGNOSIS — K5909 Other constipation: Secondary | ICD-10-CM

## 2021-08-28 DIAGNOSIS — G4700 Insomnia, unspecified: Secondary | ICD-10-CM

## 2021-09-03 ENCOUNTER — Ambulatory Visit: Payer: Medicare Other | Attending: Nurse Practitioner | Admitting: Physical Therapy

## 2021-09-03 ENCOUNTER — Encounter: Payer: Self-pay | Admitting: Physical Therapy

## 2021-09-03 DIAGNOSIS — R293 Abnormal posture: Secondary | ICD-10-CM | POA: Diagnosis not present

## 2021-09-03 DIAGNOSIS — M25511 Pain in right shoulder: Secondary | ICD-10-CM | POA: Insufficient documentation

## 2021-09-03 DIAGNOSIS — M6281 Muscle weakness (generalized): Secondary | ICD-10-CM | POA: Diagnosis present

## 2021-09-03 DIAGNOSIS — M25611 Stiffness of right shoulder, not elsewhere classified: Secondary | ICD-10-CM | POA: Diagnosis present

## 2021-09-03 NOTE — Therapy (Signed)
[facility] ?[place], [place], [address] ?Phone: [phone]   Fax:  [phone] ? ?Physical Therapy Treatment ? ?Patient Details  ?Name: [name] ?MRN: [id number] ?Date of Birth: [birth year] ?Referring Provider (PT): Eldridge Abrahams ? ? ?Encounter Date: 09/03/2021 ? ?PHYSICAL THERAPY DISCHARGE SUMMARY ? ?Visits from Start of Care: 11 ? ?Current functional level related to goals / functional outcomes: ?See below  ?  ?Remaining deficits: ?See below  ?  ?Education / Equipment: ?See below   ? ?Patient agrees to discharge. Patient goals were partially met. Patient is being discharged due to maximized rehab potential.  ? ? ? PT End of Session - 09/03/21 1701   ? ? Visit Number 11   ? Number of Visits 11   ? Authorization Type MCR and MCD   ? Authorization Time Period 06/02/21 to 06/10/2021; extended to 3/30   ? Progress Note Due on Visit 15   ? PT Start Time 1620   ? PT Stop Time 1643   all skilled services complete, pt with no questions or concerns  ? PT Time Calculation (min) 23 min   ? Activity Tolerance Patient tolerated treatment well   ? Behavior During Therapy [facility] for tasks assessed/performed   ? ?  ?  ? ?  ? ? ?Past Medical History:  ?Diagnosis Date  ? Allergic rhinitis   ? Allergic to cats   ? pet dander  ? Allergy   ? Anemia   ? Anxiety   ? Arthritis 09/2018  ? both feet  ? Asthma   ? Back pain   ? Bipolar disorder ([place])   ? Cervicalgia   ? Chronic fatigue syndrome   ? Constipation   ? Depression   ? Diabetes mellitus without complication ([place])   ? "pre"  ? Dyspnea   ? Environmental allergies   ? Fibromyalgia   ? GERD (gastroesophageal reflux disease)   ? Grave's disease   ? Heart murmur   ? High cholesterol   ? History of blood clots   ? Hypertension   ? Joint pain   ? Lactose intolerance   ? Lower extremity edema   ? Multiple food allergies   ? OSA (obstructive sleep apnea)   ? Osteoporosis   ? Palpitations   ? Panic disorder   ? Rheumatoid  arthritis ([place])   ? Risk for falls   ? Sciatica   ? Severe 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 ([place])   ? Sleep apnea   ? no cpap worn  ? Syncope   ? Syncope and collapse   ? Thyroid disease   ? Vasovagal syncope   ? Vertigo   ? Vitamin D deficiency   ? ? ?Past Surgical History:  ?Procedure Laterality Date  ? ABDOMINAL HYSTERECTOMY  2006  ? COLONOSCOPY    ? GASTRIC BYPASS  2008  ? ? ?There were no vitals filed for this visit. ? ? Subjective Assessment - 09/03/21 1622   ? ? Subjective I've been doing good, I'm sad that water therapy is going to end but Tharon Aquas has been giving me some exercise to keep up with my and cousin is helping me with water aerobics. I'm feeling better and I've lost 11 pounds. I don't have pain as bad on my left leg but I still get pain in my left arm if I move it too fast.   ? Patient Stated Goals get rid of pain   ?  Currently in Pain? Yes   ? Pain Score 2    ? Pain Location Back   ? Pain Orientation Right;Left;Lower   ? Pain Descriptors / Indicators Discomfort   ? Pain Type Chronic pain   ? Pain Radiating Towards little bit in my upper left leg   ? Pain Onset More than a month ago   ? Pain Frequency Intermittent   ? ?  ?  ? ?  ? ? ? ? ? [facility] PT Assessment - 09/03/21 0001   ? ?  ? Assessment  ? Medical Diagnosis R shoulder pain   ? Referring Provider (PT) Eldridge Abrahams   ? Onset Date/Surgical Date --   chronic  ? Next MD Visit Eldridge Abrahams may 10th   ? Prior Therapy PT here in the past for a[place]   ?  ? Precautions  ? Precautions None   ? Precaution Comments hx of syncope   ?  ? Restrictions  ? Weight Bearing Restrictions No   ?  ? Balance Screen  ? Has the patient fallen in the past 6 months Yes   ? How many times? 6   ? Has the patient had a decrease in activity level because of a fear of falling?  Yes   ? Is the patient reluctant to leave their home because of a fear of falling?  No   ?  ? Home Environment  ? Living Environment Private residence   ?  ? Prior Function   ? Level of Independence Independent;Independent with basic ADLs   ? Vocation On disability   ? Leisure play with son's dog   ?  ? Observation/Other Assessments  ? Focus on Therapeutic Outcomes (FOTO)  78   ?  ? AROM  ? Right Shoulder Flexion 116 Degrees   ? Right Shoulder ABduction 130 Degrees   ? Right Shoulder Internal Rotation --   L2  ? Right Shoulder External Rotation --   able to get to high occipital lobe  ? Left Shoulder Flexion 130 Degrees   ? Left Shoulder ABduction 110 Degrees   ? Left Shoulder Internal Rotation --   T4  ? Left Shoulder External Rotation --   C7  ? Cervical Flexion 50% limited   ? Cervical Extension 20% limited   ? Cervical - Right Side Bend 50% limited   ? Cervical - Left Side Bend 50% limited   ? Cervical - Right Rotation 50% limited   ? Cervical - Left Rotation WNL   ? Thoracic Flexion severe limitation   ? Thoracic Extension severe limitation   ? Thoracic - Right Side Bend moderate limitation   ? Thoracic - Left Side Bend moderate limitation   ? Thoracic - Right Rotation moderate limitation   ? Thoracic - Left Rotation moderate limitation   ?  ? Strength  ? Right Shoulder Flexion 3+/5   ? Right Shoulder Extension 4/5   ? Right Shoulder ABduction 3/5   ? Right Shoulder Internal Rotation 4/5   ? Right Shoulder External Rotation 4/5   ? Left Shoulder Flexion 4+/5   ? Left Shoulder Extension 4-/5   ? Left Shoulder ABduction 4/5   ? Left Shoulder Internal Rotation 4+/5   ? Left Shoulder External Rotation 4+/5   ? Right Elbow Flexion 4+/5   ? Right Elbow Extension 4/5   ? Left Elbow Flexion 4+/5   ? Left Elbow Extension 4+/5   ?  ? Palpation  ? Palpation comment still  some soft tissue limitations, much less sore   ? ?  ?  ? ?  ? ? ? ? ? ? ? ? ? ? ? ? ? ? ? ? ? ? ? ? ? ? ? ? ? PT Education - 09/03/21 1700   ? ? Education provided Yes   ? Education Details discussed improtance of remaining active with water exercise on her own especially since she has gotten so much benefit from this, DC  today to extensive home program   ? Person(s) Educated Patient   ? Methods Explanation   ? Comprehension Verbalized understanding   ? ?  ?  ? ?  ? ? ? PT Short Term Goals - 09/03/21 1638   ? ?  ? PT SHORT TERM GOAL #1  ? Title Will be independent with appropriate HEP, to be updated PRN   ? Baseline 4/13- having been keeping up with water exercises with cousin outside of PT   ? Time 2   ? Period Weeks   ? Status Achieved   ? ?  ?  ? ?  ? ? ? ? PT Long Term Goals - 09/03/21 1638   ? ?  ? PT LONG TERM GOAL #1  ? Title Pain will be no more than 7/10 at worst   ? Baseline 4/13- 6-7/10   ? Time 4   ? Period Weeks   ? Status Achieved   ?  ? PT LONG TERM GOAL #2  ? Title Cervical and thoracic mobility to improve by at least 50% in order to reduce pain and help improve posture   ? Baseline 4/13- quite stiff   ? Time 4   ? Period Weeks   ? Status Not Met   ?  ? PT LONG TERM GOAL #3  ? Title R shoulder ROM to be equal to that of the L in order to improve function   ? Baseline 4/13 better for flexion/ABD, still a difference between IR/ER   ? Time 4   ? Period Weeks   ? Status Partially Met   ?  ? PT LONG TERM GOAL #4  ? Title MMT to have improved by at least 1 grade in all tested groups in order to assist in reducing pain and improving function   ? Time 4   ? Period Weeks   ? Status Partially Met   ? ?  ?  ? ?  ? ? ? ? ? ? ? ? Plan - 09/03/21 1702   ? ? Clinical Impression Statement [name] is doing quite well, very pleased with the water therapy she has received; she has also been able to keep with a regular water exercise routine using extensive HEP from water therapist with her cousin at the [facility]. Per water therapist, has likely maximized benefit from water therapies at this time however and ready for DC. [name] had no questions or concerns for me today, I did reinforce education that keeping up with exercise and activity will be key to managing her pain moving forward. DC today, thank you for the referral!   ? Personal  Factors and Comorbidities Age;Fitness;Behavior Pattern;Past/Current Experience;Comorbidity 3+;Time since onset of injury/illness/exacerbation   ? Examination-Activity Limitations Bathing;Bed Mobility;Reach Overhead;

## 2021-09-10 ENCOUNTER — Emergency Department (HOSPITAL_COMMUNITY): Payer: Medicare Other

## 2021-09-10 ENCOUNTER — Emergency Department (HOSPITAL_COMMUNITY)
Admission: EM | Admit: 2021-09-10 | Discharge: 2021-09-10 | Disposition: A | Discharge status: Home / Self Care | Payer: Medicare Other | Attending: Emergency Medicine | Admitting: Emergency Medicine

## 2021-09-10 DIAGNOSIS — W19XXXA Unspecified fall, initial encounter: Secondary | ICD-10-CM | POA: Diagnosis not present

## 2021-09-10 DIAGNOSIS — R42 Dizziness and giddiness: Secondary | ICD-10-CM | POA: Diagnosis present

## 2021-09-10 DIAGNOSIS — Z91040 Latex allergy status: Secondary | ICD-10-CM | POA: Diagnosis not present

## 2021-09-10 LAB — BASIC METABOLIC PANEL
Anion gap: 10 (ref 5–15)
BUN: 15 mg/dL (ref 6–20)
CO2: 26 mmol/L (ref 22–32)
Calcium: 9.2 mg/dL (ref 8.9–10.3)
Chloride: 105 mmol/L (ref 98–111)
Creatinine, Ser: 1.05 mg/dL — ABNORMAL HIGH (ref 0.44–1.00)
GFR, Estimated: >60 mL/min (ref >60)
Glucose, Bld: 92 mg/dL (ref 70–99)
Potassium: 3.4 mmol/L — ABNORMAL LOW (ref 3.5–5.1)
Sodium: 141 mmol/L (ref 135–145)

## 2021-09-10 LAB — CBC WITH DIFFERENTIAL/PLATELET
Abs Immature Granulocytes: 0.01 10*3/uL (ref 0.00–0.07)
Basophils Absolute: 0 10*3/uL (ref 0.0–0.1)
Basophils Relative: 1 %
Eosinophils Absolute: 0.1 10*3/uL (ref 0.0–0.5)
Eosinophils Relative: 1 %
HCT: 42.5 % (ref 36.0–46.0)
Hemoglobin: 13.3 g/dL (ref 12.0–15.0)
Immature Granulocytes: 0 %
Lymphocytes Relative: 20 %
Lymphs Abs: 1.5 10*3/uL (ref 0.7–4.0)
MCH: 28.4 pg (ref 26.0–34.0)
MCHC: 31.3 g/dL (ref 30.0–36.0)
MCV: 90.8 fL (ref 80.0–100.0)
Monocytes Absolute: 0.8 10*3/uL (ref 0.1–1.0)
Monocytes Relative: 12 %
Neutro Abs: 4.8 10*3/uL (ref 1.7–7.7)
Neutrophils Relative %: 66 %
Platelets: 263 10*3/uL (ref 150–400)
RBC: 4.68 MIL/uL (ref 3.87–5.11)
RDW: 13.5 % (ref 11.5–15.5)
WBC: 7.3 10*3/uL (ref 4.0–10.5)
nRBC: 0 % (ref 0.0–0.2)

## 2021-09-10 MED ORDER — MECLIZINE HCL 25 MG PO TABS: 25.0000 mg | ORAL_TABLET | Freq: Three times a day (TID) | ORAL | 0 refills | Status: DC | PRN

## 2021-09-10 MED ORDER — GADOBUTROL 1 MMOL/ML IV SOLN
10.0000 mL | Freq: Once | INTRAVENOUS | Status: AC | PRN
  Administered 2021-09-10: 10 mL via INTRAVENOUS

## 2021-09-10 NOTE — ED Provider Notes (Signed)
?[facility] ?Provider Note ? ? ?CSN: [id number] ?Arrival date & time: 09/10/21  1650 ? ?  ? ?History ? ?Chief Complaint  ?Patient presents with  ? Fall  ?  Patient repots hx of vertigo appox 4 years ago. Patient reports that she has had weakness and dizziness for the last several weeks upon standing. EMS reports no orthostatic changes. Patient reports having left sided weakness and "sluring" when speaking starting January 2023.   ? ? ?[name] is a 56 y.o. female. ? ?HPI ? ?56 year old female past medical history of vertigo and MS versus complicated headache presents to the emergency department with multiple episodes of positional dizziness now associated with left lower extremity weakness and possible change in speech.  Patient is a nonspecific historian.  She states that she frequently has dizziness with position changes, more frequently since January of this year that has been attributed to vertigo.  She has had intermittent dizziness and headaches going back to 2019 when she had an MRI that showed findings consistent with complicated headaches versus MS.  However recently she has had dizziness worsening now with intermittent left lower extremity weakness and people state that her speech is slurred at times although she does not notice a significant difference.  No headache, no vision changes/eye pain, no aphasia.  Patient states she follows with multiple specialists for MS, is currently not on any steroid therapy.  Does not know when her last neuroimaging was.  No recent history of fever, chest pain, shortness of breath, leg swelling. ? ?Home Medications ?Prior to Admission medications   ?Medication Sig Start Date End Date Taking? Authorizing Provider  ?albuterol (VENTOLIN HFA) 108 (90 Base) MCG/ACT inhaler Inhale 2 puffs into the lungs every 6 (six) hours as needed for wheezing. 12/20/19  Yes Whitmire, Thermon Leylandawn W, FNP  ?azelastine (ASTELIN) 0.1 % nasal spray Place 2  sprays into both nostrils in the morning. Use in each nostril as directed   Yes [provider]  ?budesonide-formoterol (SYMBICORT) 80-4.5 MCG/ACT inhaler Inhale 2 puffs into the lungs 2 (two) times daily as needed (wheezing/sob).    Yes [provider]  ?buPROPion (WELLBUTRIN XL) 150 MG 24 hr tablet Take 450 mg by mouth daily.   Yes [provider]  ?cetirizine (ZYRTEC) 10 MG tablet Take 10 mg by mouth daily. 06/16/21  Yes [provider]  ?cyclobenzaprine (FLEXERIL) 10 MG tablet Take 10 mg by mouth 3 (three) times daily as needed for muscle spasms. 06/16/21  Yes [provider]  ?divalproex (DEPAKOTE ER) 500 MG 24 hr tablet Take 3 tablets (1,500 mg total) by mouth daily. ?Patient taking differently: Take 500 mg by mouth daily. 08/13/21  Yes Oletta DarterAgarwal, [place], MD  ?doxepin (SINEQUAN) 100 MG capsule Take 1 capsule (100 mg total) by mouth at bedtime. 08/13/21  Yes Oletta DarterAgarwal, [place], MD  ?Dulaglutide (TRULICITY) 3 MG/0.5ML SOPN Inject 3 mg into the skin every 30 (thirty) days.   Yes [provider]  ?estradiol (ESTRACE) 0.5 MG tablet Take 0.5 mg by mouth daily. 07/20/19  Yes [provider]  ?fluticasone (FLONASE) 50 MCG/ACT nasal spray PLACE 1 SPRAY INTO BOTH NOSTRILS AS NEEDED (ALLERGIES/RUNNY NOSE). 10/22/20  Yes Whitmire, Thermon Leylandawn W, FNP  ?hydrochlorothiazide (MICROZIDE) 12.5 MG capsule TAKE 1 CAPSULE (12.5 MG TOTAL) BY MOUTH DAILY AS NEEDED (EDEMA). 04/20/21  Yes [place] NickelBrown, Angel A, DO  ?linaclotide (LINZESS) 290 MCG CAPS capsule Take 1 capsule (290 mcg total) by mouth daily before breakfast. 07/15/21  Yes  Helane Rima, DO  ?meloxicam (MOBIC) 15 MG tablet Take 15 mg by mouth daily. 06/17/20  Yes [provider]  ?MOVANTIK 25 MG TABS tablet Take 25 mg by mouth daily. 07/27/19  Yes [provider]  ?Multiple Vitamin (MULTIVITAMIN) tablet Take 1 tablet by mouth daily.   Yes [provider]  ?phentermine 15 MG capsule Take 1 capsule (15 mg total) by  mouth every morning. 08/12/21  Yes Helane Rima, DO  ?tapentadol HCl (NUCYNTA) 75 MG tablet Take 75 mg by mouth in the morning, at noon, and at bedtime.   Yes [provider]  ?Thiamine HCl (VITAMIN B-1 PO) Take 1 tablet by mouth daily.    Yes [provider]  ?Vitamin D, Ergocalciferol, (DRISDOL) 1.25 MG (50000 UNIT) CAPS capsule Take 1 capsule (50,000 Units total) by mouth every 7 (seven) days. 04/07/21  Yes Corinna Capra A, DO  ?diclofenac sodium (VOLTAREN) 1 % GEL Apply 2 g topically 4 (four) times daily. ?Patient not taking: Reported on 09/10/2021 12/12/18   Vivi Barrack, DPM  ?Galcanezumab-gnlm (EMGALITY) 120 MG/ML SOAJ INJECT 120 MG INTO THE SKIN EVERY 30 DAYS ?Patient not taking: Reported on 09/10/2021 04/21/21   Anson Fret, MD  ?   ? ?Allergies    ?Bee venom, Coconut fatty acids, Mushroom ext cmplx(shiitake-reishi-mait), Nutritional supplements, Other, Shellfish allergy, Strawberry extract, Hydrocodone-acetaminophen, Latex, and Hydrocodone-acetaminophen   ? ?Review of Systems   ?Review of Systems  ?Constitutional:  Negative for fatigue and fever.  ?Respiratory:  Negative for shortness of breath.   ?Cardiovascular:  Negative for chest pain.  ?Gastrointestinal:  Negative for abdominal pain, diarrhea and vomiting.  ?Skin:  Negative for rash.  ?Neurological:  Positive for dizziness, syncope, speech difficulty, weakness, light-headedness and numbness. Negative for seizures, facial asymmetry and headaches.  ? ?Physical Exam ?Updated Vital Signs ?BP (!) 125/103   Pulse 87   Temp 98.1 ?F (36.7 ?C) (Oral)   Resp 18   Ht  (1.676 m)   Wt 99.3 kg   SpO2 97%   BMI 35.35 kg/m?  ?Physical Exam ?Vitals and nursing note reviewed.  ?Constitutional:   ?   General: She is not in acute distress. ?   Appearance: Normal appearance.  ?HENT:  ?   Head: Normocephalic.  ?   Mouth/Throat:  ?   Mouth: Mucous membranes are moist.  ?Eyes:  ?   Pupils: Pupils are equal, round, and reactive to light.   ?Cardiovascular:  ?   Rate and Rhythm: Normal rate.  ?Pulmonary:  ?   Effort: Pulmonary effort is normal. No respiratory distress.  ?Abdominal:  ?   Palpations: Abdomen is soft.  ?   Tenderness: There is no abdominal tenderness.  ?Skin: ?   General: Skin is warm.  ?Neurological:  ?   Mental Status: She is alert and oriented to person, place, and time. Mental status is at baseline.  ?   Cranial Nerves: No cranial nerve deficit.  ?   Comments: Clear speech, equal strength BLE, inconsistent decreased LLE sensation around calf area.  ?Psychiatric:     ?   Mood and Affect: Mood normal.  ? ? ?ED Results / Procedures / Treatments   ?Labs ?(all labs ordered are listed, but only abnormal results are displayed) ?Labs Reviewed  ?CBC WITH DIFFERENTIAL/PLATELET  ?BASIC METABOLIC PANEL  ? ? ?EKG ?None ? ?Radiology ?No results found. ? ?Procedures ?Procedures  ? ? ?Medications Ordered in ED ?Medications - No data to display ? ?ED  Course/ Medical Decision Making/ A&P ?  ?                        ?Medical Decision Making ?Amount and/or Complexity of Data Reviewed ?Labs: ordered. ?Radiology: ordered. ? ?Risk ?Prescription drug management. ? ? ?56 year old female presents emergency department with positional dizziness, previous history of vertigo and intermittent left lower extremity numbness.  Self-reported history of previous diagnosis of MS, states that she follows as an outpatient with "multiple specialties". Outpatient MRI in care everywhere does show stable periventricular lesions, possible MS disease versus complicated headaches.  She has subjective decrease sensation in the left lower extremity but no acute neurodeficit on exam.  Speech at times is stuttered but no aphasia.  These are similar symptoms to what she is experienced on the way back to 2019.  No chest pain, palpitations or cardiac symptoms to dizziness. ? ?Blood work is reassuring.  MRI of the brain/neck show previous brain lesions that have not progressed or  changed, no new lesions in the cervical spine.  No findings of CVA.  After evaluation here patient states her symptoms have resolved.  She is requesting a prescription for medicine that she took when she originally ha

## 2021-09-10 NOTE — ED Notes (Signed)
Pt ambulated to bathroom and back

## 2021-09-10 NOTE — ED Notes (Signed)
Pt transported to MRi 

## 2021-09-10 NOTE — ED Notes (Signed)
Provider at bedside to assess patient at this time ?

## 2021-09-10 NOTE — ED Notes (Signed)
This RN spoke with MRI tech regarding the patient's need for an MRI  ?

## 2021-09-10 NOTE — ED Notes (Signed)
Pt returned from MRI °

## 2021-09-10 NOTE — Discharge Instructions (Addendum)
Your heart and brain work up was normal for you. Follow up with neurology for continued care. Take meclizine as directed and stay well hydrated. Return for worsening symptoms.  ?

## 2021-09-17 ENCOUNTER — Telehealth (HOSPITAL_BASED_OUTPATIENT_CLINIC_OR_DEPARTMENT_OTHER): Payer: Medicare Other | Admitting: Psychiatry

## 2021-09-17 DIAGNOSIS — F411 Generalized anxiety disorder: Secondary | ICD-10-CM

## 2021-09-17 DIAGNOSIS — G4700 Insomnia, unspecified: Secondary | ICD-10-CM

## 2021-09-17 DIAGNOSIS — F3181 Bipolar II disorder: Secondary | ICD-10-CM

## 2021-09-17 DIAGNOSIS — F4001 Agoraphobia with panic disorder: Secondary | ICD-10-CM

## 2021-09-17 NOTE — Progress Notes (Signed)
Virtual Visit via Video Note ? ?I connected with [name] on 09/17/21 at 11:30 AM EDT by a video enabled telemedicine application and verified that I am speaking with the correct person using two identifiers. ? ?Location: ?Patient: home ?Provider: office ?  ?I discussed the limitations of evaluation and management by telemedicine and the availability of in person appointments. The patient expressed understanding and agreed to proceed. ? ?History of Present Illness: ?"I have been ok". [name] is having an MS episode. She was experiencing in a lot of pain. Her left side was very week and she fell many times. The last time she fell was 2 days ago.  She went to the ED and was told they don't see anything. [name] was given something to decrease her dizziness and it is helping.  She started feeling better yesterday. Her sleep has improved,  It takes her a long while to fall asleep but then she is now sleeping about 7-8hrs. Energy is low. Her appetite is increased. She has been craving sweets in the evenings. She has been eating more and has gained about 2 lbs in 2 weeks. Her depression is much better over the last 2 weeks. [name] was going out and socializing and enjoying it prior to her MS flair. She enjoys swimming but was having a lot of anxiety before starting. She has had 3 stress induced panic attacks when having to go out to events in the last 1 month.   Overall she enjoys it once she is in the water. Pt denies recent manic and hypomanic symptoms including periods of decreased need for sleep, increased energy, mood lability, impulsivity, FOI, and excessive spending. She denies SI/HI. ? ?  ?Observations/Objective: ?Psychiatric Specialty Exam: ?ROS  ?There were no vitals taken for this visit.There is no height or weight on file to calculate BMI.  ?General Appearance: Casual  ?Eye Contact:  Good  ?Speech:  Clear and Coherent and Normal Rate  ?Volume:  Normal  ?Mood:  Anxious and Depressed  ?Affect:  Full  Range  ?Thought Process:  Coherent and Descriptions of Associations: Intact  ?Orientation:  Full (Time, Place, and Person)  ?Thought Content:  Rumination  ?Suicidal Thoughts:  No  ?Homicidal Thoughts:  No  ?Memory:  Immediate;   Good  ?Judgement:  Fair  ?Insight:  Fair  ?Psychomotor Activity:  Normal  ?Concentration:  Concentration: Good  ?Recall:  Good  ?Fund of Knowledge:  Good  ?Language:  Good  ?Akathisia:  No  ?Handed:  Right  ?AIMS (if indicated):     ?Assets:  Communication Skills ?Desire for Improvement ?Financial Resources/Insurance ?Housing ?Resilience ?Social Support ?Talents/Skills ?Transportation ?Vocational/Educational  ?ADL's:  Intact  ?Cognition:  WNL  ?Sleep:     ? ? ? ?Assessment and Plan: ? ? ?  09/17/2021  ? 11:37 AM 07/16/2021  ? 11:33 AM 05/07/2021  ?  9:34 AM 04/09/2021  ?  9:01 AM 11/20/2020  ? 11:04 AM  ?Depression screen PHQ 2/9  ?Decreased Interest 0 ?Down, Depressed, Hopeless ?PHQ - 2 Score ?Altered sleeping  3 0 2 1  ?Tired, decreased energy  0 ?Change in appetite  0 3 0 2  ?Feeling bad or failure about yourself   ?Trouble concentrating  ?Moving slowly or fidgety/restless  0 0 1 0  ?Suicidal thoughts  0 0  0 0  ?PHQ-9 Score  9 14 14 10   ?Difficult doing work/chores  Very difficult Very difficult Extremely dIfficult Very difficult  ? ? ?Flowsheet Row [facility]  ?C-SSRS RISK CATEGORY No Risk No Risk No Risk  ? ?  ? ? ? ?Status of current problems: improved sleep and depression and anxiety ? ?Meds:  ?1. Bipolar II disorder (HCC) ? ?2. Panic disorder with agoraphobia ? ?3. GAD (generalized anxiety disorder) ? ?4. Insomnia, unspecified type ? ? ?- continue Depakote ER 1500mg  po qHS ?- continue Doxepin 100mg  po  qHS ? ?[name] remains concerned about her weight. She is working with a weight loss clinic.  ? ?Therapy: brief supportive therapy provided. Discussed psychosocial stressors in detail.   ? ? ?Collaboration of Care: Other none today ? ?Patient/Guardian was advised Release of Information must be obtained prior to any record release in order to collaborate their care with an outside provider. Patient/Guardian was advised if they have not already done so to contact the registration department to sign all necessary forms in order for us to release information regarding their care.  ? ?Consent: Patient/Guardian gives verbal consent for treatment and assignment of benefits for services provided during this visit. Patient/Guardian expressed understanding and agreed to proceed.  ? ? ? ? ?Follow Up Instructions: ?Follow up in 2-3 months  or sooner if needed ?  ? ?I discussed the assessment and treatment plan with the patient. The patient was provided an opportunity to ask questions and all were answered. The patient agreed with the plan and demonstrated an understanding of the instructions. ?  ?The patient was advised to call back or seek an in-person evaluation if the symptoms worsen or if the condition fails to improve as anticipated. ? ?I provided 16 minutes of non-face-to-face time during this encounter. ? ? ?Colleen DarterSalina Shyna Duignan, MD ? ?

## 2021-09-27 ENCOUNTER — Other Ambulatory Visit (INDEPENDENT_AMBULATORY_CARE_PROVIDER_SITE_OTHER): Payer: Self-pay | Admitting: Bariatrics

## 2021-09-27 ENCOUNTER — Other Ambulatory Visit (HOSPITAL_COMMUNITY): Payer: Self-pay | Admitting: Psychiatry

## 2021-09-27 DIAGNOSIS — F5089 Other specified eating disorder: Secondary | ICD-10-CM

## 2021-09-27 DIAGNOSIS — F3181 Bipolar II disorder: Secondary | ICD-10-CM

## 2021-09-27 DIAGNOSIS — K5909 Other constipation: Secondary | ICD-10-CM

## 2021-09-27 DIAGNOSIS — R600 Localized edema: Secondary | ICD-10-CM

## 2021-09-27 DIAGNOSIS — E559 Vitamin D deficiency, unspecified: Secondary | ICD-10-CM

## 2021-09-27 DIAGNOSIS — G4700 Insomnia, unspecified: Secondary | ICD-10-CM

## 2021-10-13 ENCOUNTER — Ambulatory Visit: Payer: Medicare Other | Admitting: Neurology

## 2021-10-22 ENCOUNTER — Other Ambulatory Visit: Payer: Self-pay | Admitting: Nurse Practitioner

## 2021-10-22 DIAGNOSIS — M4727 Other spondylosis with radiculopathy, lumbosacral region: Secondary | ICD-10-CM

## 2021-10-23 ENCOUNTER — Other Ambulatory Visit (HOSPITAL_COMMUNITY): Payer: Self-pay | Admitting: Psychiatry

## 2021-10-23 ENCOUNTER — Other Ambulatory Visit (INDEPENDENT_AMBULATORY_CARE_PROVIDER_SITE_OTHER): Payer: Self-pay | Admitting: Bariatrics

## 2021-10-23 DIAGNOSIS — F5089 Other specified eating disorder: Secondary | ICD-10-CM

## 2021-10-23 DIAGNOSIS — F411 Generalized anxiety disorder: Secondary | ICD-10-CM

## 2021-10-23 DIAGNOSIS — F4001 Agoraphobia with panic disorder: Secondary | ICD-10-CM

## 2021-10-23 DIAGNOSIS — G4700 Insomnia, unspecified: Secondary | ICD-10-CM

## 2021-10-23 DIAGNOSIS — F3181 Bipolar II disorder: Secondary | ICD-10-CM

## 2021-10-23 DIAGNOSIS — K5909 Other constipation: Secondary | ICD-10-CM

## 2021-10-23 DIAGNOSIS — R600 Localized edema: Secondary | ICD-10-CM

## 2021-10-23 DIAGNOSIS — E559 Vitamin D deficiency, unspecified: Secondary | ICD-10-CM

## 2021-10-27 ENCOUNTER — Ambulatory Visit (INDEPENDENT_AMBULATORY_CARE_PROVIDER_SITE_OTHER): Payer: Medicare Other | Admitting: Neurology

## 2021-10-27 ENCOUNTER — Ambulatory Visit: Payer: Medicare Other | Admitting: Neurology

## 2021-10-27 DIAGNOSIS — G43711 Chronic migraine without aura, intractable, with status migrainosus: Secondary | ICD-10-CM | POA: Diagnosis not present

## 2021-10-27 NOTE — Progress Notes (Signed)
Consent Form Botulism Toxin Injection For Chronic Migraine   10/27/2021: stable doing well. She lost 30 pounds, her hair looks great, she had an episode of dizziness and weakness wasn;t eating or drinking after a breakup, she has had some falls due to right leg weakness pending mri lumbar spine we wll see her on the 22nd.   07/21/2021: She gets > 70% relief migraine frequency, extremely anxious, do the botox fast, +a  04/21/2021: stable, still doing same if not better > 60% improvement. She broke up with her boyfriend. Patient states she did not take the xanax, she is here alone, I warned her not to drive if she takes xanax or clonazepam and have someone drive her.   07/29/6576: getting back on track. Will give some xanax for next time she is incredibly stressed when she comes in, she is lovely, here with fiance  Interval history 04/28/2020: Doing great, More than 60% improvement in frequency but also in severity. Ajovy not approve using emgality instead. she has failed imitrex and maxalt and cannot take anymore triptans, contraindicated due to side effects, gave her some samples. She feels clenching is a problem and she has pain when clenching in the temples as well and we give her muscle relaxers since we cannot put botox there. She had HTN on aimovig, stopped. She had botox in the past and we restarted on 03/2019, doing great. Loves emgality as an add on as well. No date set for her wedding yet, it has been 2.5 years. She is very anxious and took anxiety medication today and did better but she is very anxious. She wants an outdoor wedding. She did much better, she took her anxiety and pain meds. [facility]    Reviewed orally with patient, additionally signature is on file:  Botulism toxin has been approved by the Federal drug administration for treatment of chronic migraine. Botulism toxin does not cure chronic migraine and it may not be effective  in some patients.  The administration of botulism toxin is accomplished by injecting a small amount of toxin into the muscles of the neck and head. Dosage must be titrated for each individual. Any benefits resulting from botulism toxin tend to wear off after 3 months with a repeat injection required if benefit is to be maintained. Injections are usually done every 3-4 months with maximum effect peak achieved by about 2 or 3 weeks. Botulism toxin is expensive and you should be sure of what costs you will incur resulting from the injection.  The side effects of botulism toxin use for chronic migraine may include:   -Transient, and usually mild, facial weakness with facial injections  -Transient, and usually mild, head or neck weakness with head/neck injections  -Reduction or loss of forehead facial animation due to forehead muscle weakness  -Eyelid drooping  -Dry eye  -Pain at the site of injection or bruising at the site of injection  -Double vision  -Potential unknown long term risks  Contraindications: You should not have Botox if you are pregnant, nursing, allergic to albumin, have an infection, skin condition, or muscle weakness at the site of the injection, or have myasthenia gravis, Lambert-Eaton syndrome, or ALS.  It is also possible that as with any injection, there may be an allergic reaction or no effect from the medication. Reduced effectiveness after repeated injections is sometimes seen and rarely infection at the injection site may occur. All care will be taken to  prevent these side effects. If therapy is given over a long time, atrophy and wasting in the muscle injected may occur. Occasionally the patient's become refractory to treatment because they develop antibodies to the toxin. In this event, therapy needs to be modified.  I have read the above information and consent to the administration of botulism toxin.    BOTOX PROCEDURE NOTE FOR MIGRAINE  HEADACHE    Contraindications and precautions discussed with patient(above). Aseptic procedure was observed and patient tolerated procedure. Procedure performed by Dr. Artemio Alyoni Nailea Whitehorn  The condition has existed for more than 6 months, and pt does not have a diagnosis of ALS, Myasthenia Gravis or Lambert-Eaton Syndrome.  Risks and benefits of injections discussed and pt agrees to proceed with the procedure.  Written consent obtained  These injections are medically necessary. Pt  receives good benefits from these injections. These injections do not cause sedations or hallucinations which the oral therapies may cause.  Description of procedure:  The patient was placed in a sitting position. The standard protocol was used for Botox as follows, with 5 units of Botox injected at each site:   -Procerus muscle, midline injection  -Corrugator muscle, bilateral injection  -Frontalis muscle, bilateral injection, with 2 sites each side, medial injection was performed in the upper one third of the frontalis muscle, in the region vertical from the medial inferior edge of the superior orbital rim. The lateral injection was again in the upper one third of the forehead vertically above the lateral limbus of the cornea, 1.5 cm lateral to the medial injection site.  -Temporalis muscle injection, 4 sites, bilaterally. The first injection was 3 cm above the tragus of the ear, second injection site was 1.5 cm to 3 cm up from the first injection site in line with the tragus of the ear. The third injection site was 1.5-3 cm forward between the first 2 injection sites. The fourth injection site was 1.5 cm posterior to the second injection site.   -Occipitalis muscle injection, 3 sites, bilaterally. The first injection was done one half way between the occipital protuberance and the tip of the mastoid process behind the ear. The second injection site was done lateral and superior to the first, 1 fingerbreadth from the first  injection. The third injection site was 1 fingerbreadth superiorly and medially from the first injection site.  -Cervical paraspinal muscle injection, 2 sites, bilateral knee first injection site was 1 cm from the midline of the cervical spine, 3 cm inferior to the lower border of the occipital protuberance. The second injection site was 1.5 cm superiorly and laterally to the first injection site.  -Trapezius muscle injection was performed at 3 sites, bilaterally. The first injection site was in the upper trapezius muscle halfway between the inflection point of the neck, and the acromion. The second injection site was one half way between the acromion and the first injection site. The third injection was done between the first injection site and the inflection point of the neck.   Will return for repeat injection in 3 months.   155 units of Botox was used, 45u Botox not injected was wasted. The patient tolerated the procedure well, there were no complications of the above procedure.

## 2021-10-27 NOTE — Progress Notes (Signed)
Botox- 200 units x 1 vial Lot: [id number] Expiration: 04/2024 NDC: 0023-3921-02  Bacteriostatic 0.9% Sodium Chloride- 4mL total Lot: [id number] Expiration: 12/23/2022 NDC: 0409-1966-02  Dx: G43.711 B/B 

## 2021-10-29 ENCOUNTER — Telehealth (HOSPITAL_BASED_OUTPATIENT_CLINIC_OR_DEPARTMENT_OTHER): Payer: Medicare Other | Admitting: Psychiatry

## 2021-10-29 ENCOUNTER — Other Ambulatory Visit (HOSPITAL_COMMUNITY): Payer: Self-pay | Admitting: *Deleted

## 2021-10-29 ENCOUNTER — Other Ambulatory Visit (HOSPITAL_COMMUNITY): Payer: Self-pay | Admitting: Psychiatry

## 2021-10-29 DIAGNOSIS — F4001 Agoraphobia with panic disorder: Secondary | ICD-10-CM | POA: Diagnosis not present

## 2021-10-29 DIAGNOSIS — G4700 Insomnia, unspecified: Secondary | ICD-10-CM | POA: Diagnosis not present

## 2021-10-29 DIAGNOSIS — F3181 Bipolar II disorder: Secondary | ICD-10-CM

## 2021-10-29 DIAGNOSIS — F411 Generalized anxiety disorder: Secondary | ICD-10-CM | POA: Diagnosis not present

## 2021-10-29 DIAGNOSIS — F419 Anxiety disorder, unspecified: Secondary | ICD-10-CM

## 2021-10-29 MED ORDER — DIVALPROEX SODIUM ER 500 MG PO TB24: 1000.0000 mg | ORAL_TABLET | Freq: Every day | ORAL | 0 refills | Status: DC

## 2021-10-29 MED ORDER — DOXEPIN HCL 100 MG PO CAPS: 100.0000 mg | ORAL_CAPSULE | Freq: Every day | ORAL | 0 refills | Status: DC

## 2021-10-29 NOTE — Progress Notes (Signed)
Virtual Visit via Video Note  I connected with [name] on 10/29/21 at  3:30 PM EDT by a video enabled telemedicine application and verified that I am speaking with the correct person using two identifiers.  Location: Patient: at a friend's house in a car Provider: office   I discussed the limitations of evaluation and management by telemedicine and the availability of in person appointments. The patient expressed understanding and agreed to proceed.  History of Present Illness: [name]'s internet is not working at home. She is currently at a friend's house.  From Friday- Sunday she did not sleep at all despite taking all her meds and Unisom. This is the 3rd time it has happened in the last month. The rest of the week she is getting 4-5 hrs/night. Her energy and motivation are very low. A friend has been able to get her out for water aerobics and she enjoyed it. Her mind won't shut off and she can't relax. Her anxiety is high due to recent stressors. She is more irritable and she has a strong desire to isolate.  She is always thinking about the worst case scenario. About 3 weeks ago she went out swimming with her cousin. After getting home she fell asleep and woke up around 2am. She got dressed quickly and fell and got on the road. It was motivated by anxiety about her cousin being safe at home. She realized on the road that her cousin has a husband who would have reached out if the cousin did not make it home. Her memory is very poor and she is forgetting things like birthdays, directions and she is misplacing items. It is very frustrating and she is concerned.    Observations/Objective: Psychiatric Specialty Exam: ROS  There were no vitals taken for this visit.There is no height or weight on file to calculate BMI.  General Appearance: Fairly Groomed and Neat  Eye Contact:  Fair  Speech:  Clear and Coherent and Normal Rate  Volume:  Normal  Mood:  Anxious and Depressed  Affect:   Congruent  Thought Process:  Coherent and Descriptions of Associations: Circumstantial  Orientation:  Full (Time, Place, and Person)  Thought Content:  Rumination  Suicidal Thoughts:  No  Homicidal Thoughts:  No  Memory:  Immediate;   Good  Judgement:  Fair  Insight:  Shallow  Psychomotor Activity:  Normal  Concentration:  Concentration: Good  Recall:  Poor  Fund of Knowledge:  Good  Language:  Good  Akathisia:  No  Handed:  Right  AIMS (if indicated):     Assets:  Communication Skills Desire for Improvement Financial Resources/Insurance Housing Leisure Time Resilience Social Support Talents/Skills Transportation Vocational/Educational  ADL's:  Intact  Cognition:  WNL  Sleep:        Assessment and Plan:     09/17/2021   11:37 AM 07/16/2021   11:33 AM 05/07/2021    9:34 AM 04/09/2021    9:01 AM 11/20/2020   11:04 AM  Depression screen PHQ 2/9  Decreased Interest 0 1 2 2 1   Down, Depressed, Hopeless 1 1 1 2 1   PHQ - 2 Score 1 2 3 4 2   Altered sleeping  3 0 2 1  Tired, decreased energy  0 3 3 3   Change in appetite  0 3 0 2  Feeling bad or failure about yourself   1 2 2 1   Trouble concentrating  3 3 2 1   Moving slowly or fidgety/restless  0 0 1  0  Suicidal thoughts  0 0 0 0  PHQ-9 Score  Difficult doing work/chores  Very difficult Very difficult Extremely dIfficult Very difficult    Flowsheet Row Video Visit from 09/17/2021 in [facility] from 09/10/2021 in [facility] EMERGENCY DEPARTMENT Video Visit from 07/16/2021 in [facility]  C-SSRS RISK CATEGORY No Risk No Risk No Risk          Status of current problems: ongoing anxiety and depression and insomnia  Yexalen is on a number of sedating medications including Doxepin and Depakote. I feel that her poor memory and concentration and falls are likely related to side effects from her meds. I am  referring her to a sleep specialist in an effort to get her insomnia under control and hopefully decrease the number of sedatives she is on.   Meds:  1. Bipolar II disorder (HCC) - divalproex (DEPAKOTE ER) 500 MG 24 hr tablet; Take 2 tablets (1,000 mg total) by mouth daily.  Dispense: 180 tablet; Refill: 0 - doxepin (SINEQUAN) 100 MG capsule; Take 1 capsule (100 mg total) by mouth at bedtime.  Dispense: 90 capsule; Refill: 0  2. Panic disorder with agoraphobia - doxepin (SINEQUAN) 100 MG capsule; Take 1 capsule (100 mg total) by mouth at bedtime.  Dispense: 90 capsule; Refill: 0  3. GAD (generalized anxiety disorder) - doxepin (SINEQUAN) 100 MG capsule; Take 1 capsule (100 mg total) by mouth at bedtime.  Dispense: 90 capsule; Refill: 0  4. Insomnia, unspecified type - doxepin (SINEQUAN) 100 MG capsule; Take 1 capsule (100 mg total) by mouth at bedtime.  Dispense: 90 capsule; Refill: 0     Labs: blood draw 09/10/21- K is 3.4, creatinine 1.05, CBC WNL   Therapy: brief supportive therapy provided. Discussed psychosocial stressors in detail.     Collaboration of Care: Other sleep specialist  Patient/Guardian was advised Release of Information must be obtained prior to any record release in order to collaborate their care with an outside provider. Patient/Guardian was advised if they have not already done so to contact the registration department to sign all necessary forms in order for [name] to release information regarding their care.   Consent: Patient/Guardian gives verbal consent for treatment and assignment of benefits for services provided during this visit. Patient/Guardian expressed understanding and agreed to proceed.     Follow Up Instructions: Follow up in 1-2 months or sooner if needed    I discussed the assessment and treatment plan with the patient. The patient was provided an opportunity to ask questions and all were answered. The patient agreed with the plan and  demonstrated an understanding of the instructions.   The patient was advised to call back or seek an in-person evaluation if the symptoms worsen or if the condition fails to improve as anticipated.  I provided 24 minutes of non-face-to-face time during this encounter.   Oletta Darter, MD

## 2021-11-09 ENCOUNTER — Other Ambulatory Visit: Payer: Medicare Other

## 2021-11-12 ENCOUNTER — Institutional Professional Consult (permissible substitution): Payer: Medicare Other | Admitting: Neurology

## 2021-11-17 ENCOUNTER — Other Ambulatory Visit: Payer: Medicare Other

## 2021-11-21 ENCOUNTER — Other Ambulatory Visit (HOSPITAL_COMMUNITY): Payer: Self-pay | Admitting: Psychiatry

## 2021-11-21 DIAGNOSIS — G4700 Insomnia, unspecified: Secondary | ICD-10-CM

## 2021-11-27 ENCOUNTER — Ambulatory Visit
Admission: RE | Admit: 2021-11-27 | Discharge: 2021-11-27 | Disposition: A | Discharge status: Home / Self Care | Payer: Medicare Other | Source: Ambulatory Visit | Attending: Nurse Practitioner | Admitting: Nurse Practitioner

## 2021-11-27 DIAGNOSIS — M4727 Other spondylosis with radiculopathy, lumbosacral region: Secondary | ICD-10-CM

## 2021-12-02 ENCOUNTER — Ambulatory Visit (HOSPITAL_BASED_OUTPATIENT_CLINIC_OR_DEPARTMENT_OTHER): Payer: Medicare Other | Attending: Internal Medicine | Admitting: Internal Medicine

## 2021-12-02 DIAGNOSIS — G4700 Insomnia, unspecified: Secondary | ICD-10-CM | POA: Diagnosis present

## 2021-12-02 DIAGNOSIS — G478 Other sleep disorders: Secondary | ICD-10-CM | POA: Insufficient documentation

## 2021-12-02 DIAGNOSIS — F3181 Bipolar II disorder: Secondary | ICD-10-CM | POA: Diagnosis not present

## 2021-12-02 DIAGNOSIS — F419 Anxiety disorder, unspecified: Secondary | ICD-10-CM | POA: Diagnosis not present

## 2021-12-05 DIAGNOSIS — G4700 Insomnia, unspecified: Secondary | ICD-10-CM

## 2021-12-05 NOTE — Procedures (Signed)
     Patient Name: Colleen, [name] Date: 12/02/2021 Gender: Female D.O.B: 05-16-1966 Age (years): 56 Referring Provider: Oletta Darter Height (inches): 66 Interpreting Physician: Jetty Duhamel MD, ABSM Weight (lbs): 211 RPSGT: Armen Pickup BMI: 34 MRN: [id number] Neck Size: 13.50  CLINICAL INFORMATION Sleep Study Type: NPSG Indication for sleep study: Insomnia Epworth Sleepiness Score: 5  SLEEP STUDY TECHNIQUE As per the AASM Manual for the Scoring of Sleep and Associated Events v2.3 (April 2016) with a hypopnea requiring 4% desaturations.  The channels recorded and monitored were frontal, central and occipital EEG, electrooculogram (EOG), submentalis EMG (chin), nasal and oral airflow, thoracic and abdominal wall motion, anterior tibialis EMG, snore microphone, electrocardiogram, and pulse oximetry.  MEDICATIONS Medications self-administered by patient taken the night of the study : none reported  SLEEP ARCHITECTURE The study was initiated at 10:02:56 PM and ended at 4:26:51 AM.  Sleep onset time was 67.9 minutes and the sleep efficiency was 81.8%%. The total sleep time was 314 minutes.  Stage REM latency was 1.0 minutes.  The patient spent 1.0%% of the night in stage N1 sleep, 64.2%% in stage N2 sleep, 21.5%% in stage N3 and 13.4% in REM.  Alpha intrusion was absent.  Supine sleep was 0.00%.  RESPIRATORY PARAMETERS The overall apnea/hypopnea index (AHI) was 3.4 per hour. There were 2 total apneas, including 1 obstructive, 1 central and 0 mixed apneas. There were 16 hypopneas and 4 RERAs.  The AHI during Stage REM sleep was 10.0 per hour.  AHI while supine was N/A per hour.  The mean oxygen saturation was 93.0%. The minimum SpO2 during sleep was 83.0%.  soft snoring was noted during this study.  CARDIAC DATA The 2 lead EKG demonstrated sinus rhythm. The mean heart rate was 88.0 beats per minute. Other EKG findings include: None. LEG MOVEMENT DATA The  total PLMS were 0 with a resulting PLMS index of 0.0. Associated arousal with leg movement index was 0.0 .  IMPRESSIONS - No significant obstructive sleep apnea occurred during this study (AHI = 3.4/h).  - No significant central sleep apnea occurred during this study (CAI = 0.2/h). - Mild oxygen desaturation was noted during this study (Min O2 = 83.0%).  Mean 93% - The patient snored with soft snoring volume. - No cardiac abnormalities were noted during this study. - Clinically significant periodic limb movements did not occur during sleep. No significant associated arousals.  DIAGNOSIS - Normal Study  RECOMMENDATIONS - Mild snoring and a few apneas and hypopneas, within normal limits. Manage for symptoms based on clinical judgment. - Sleep hygiene should be reviewed to assess factors that may improve sleep quality. - Weight management and regular exercise should be initiated or continued if appropriate.  [Electronically signed] 12/05/2021 12:58 PM  Jetty Duhamel MD, ABSM Diplomate, American Board of Sleep Medicine NPI: [id number]                        Jetty Duhamel Diplomate, American Board of Sleep Medicine  ELECTRONICALLY SIGNED ON:  12/05/2021, 12:54 PM [facility] PH: [phone]   FX: [phone] ACCREDITED BY THE AMERICAN ACADEMY OF SLEEP MEDICINE

## 2021-12-09 ENCOUNTER — Institutional Professional Consult (permissible substitution): Payer: Medicare Other | Admitting: Neurology

## 2021-12-09 ENCOUNTER — Encounter: Payer: Self-pay | Admitting: Neurology

## 2021-12-09 ENCOUNTER — Telehealth: Payer: Self-pay | Admitting: Neurology

## 2021-12-09 NOTE — Telephone Encounter (Signed)
Patient no showed appointment today with Dr. Lucia Gaskins, cancelled appt same day 10/13/21 and has cancelled and r/s multiple appointments with [name]. Dr. Lucia Gaskins requesting dismissal of patient please review and advise if appropriate

## 2021-12-09 NOTE — Telephone Encounter (Signed)
Pt called and stated she is having car troubles and can't make her appointment today.

## 2021-12-24 ENCOUNTER — Encounter (HOSPITAL_COMMUNITY): Payer: Self-pay

## 2021-12-24 ENCOUNTER — Ambulatory Visit (HOSPITAL_COMMUNITY)
Admission: EM | Admit: 2021-12-24 | Discharge: 2021-12-24 | Disposition: A | Discharge status: Home / Self Care | Payer: Medicare Other | Attending: Internal Medicine | Admitting: Internal Medicine

## 2021-12-24 DIAGNOSIS — Z91030 Bee allergy status: Secondary | ICD-10-CM

## 2021-12-24 DIAGNOSIS — L03114 Cellulitis of left upper limb: Secondary | ICD-10-CM

## 2021-12-24 DIAGNOSIS — T63441A Toxic effect of venom of bees, accidental (unintentional), initial encounter: Secondary | ICD-10-CM | POA: Diagnosis not present

## 2021-12-24 MED ORDER — CEPHALEXIN 500 MG PO CAPS: 1000.0000 mg | ORAL_CAPSULE | Freq: Two times a day (BID) | ORAL | 0 refills | Status: DC

## 2021-12-24 MED ORDER — PREDNISONE 10 MG (21) PO TBPK: ORAL_TABLET | Freq: Every day | ORAL | 0 refills | Status: DC

## 2021-12-24 MED ORDER — METHYLPREDNISOLONE SODIUM SUCC 125 MG IJ SOLR
60.0000 mg | Freq: Once | INTRAMUSCULAR | Status: AC
  Administered 2021-12-24: 60 mg via INTRAMUSCULAR

## 2021-12-24 MED ORDER — METHYLPREDNISOLONE SODIUM SUCC 125 MG IJ SOLR
INTRAMUSCULAR | Status: AC
  Filled 2021-12-24: qty 2

## 2021-12-24 NOTE — ED Triage Notes (Signed)
Pt reports a bee sting on 4- days ago. She reports using her epi-pen and taking benadryl at that time of bee sting. She reports no SOB or wheezing at this time.

## 2021-12-24 NOTE — Discharge Instructions (Addendum)
Please always go to the ER if you have used your Epi pen so your heart gets monitored.  Start the prednisone tomorrow if the redness and itching are still present Start the antibiotic today

## 2021-12-24 NOTE — ED Provider Notes (Signed)
[facility]    CSN: [id number] Arrival date & time: 12/24/21  1619      History   Chief Complaint No chief complaint on file.   HPI [name] is a 56 y.o. female who presents due having L hand swelling due to being stung by a bee 4 days ago and used her epi pen and took benadryl.  She did not go to ER since she did not have any throat closing sensation or SOB. She got stung on her L medial ring finger. This finger and the middle and small one are itchy, and painful, and is causing her dorsal hand to swell and also have some pain. Taking Benadryl has not helped the itching.    Past Medical History:  Diagnosis Date   Allergic rhinitis    Allergic to cats    pet dander   Allergy    Anemia    Anxiety    Arthritis 09/2018   both feet   Asthma    Back pain    Bipolar disorder (HCC)    Cervicalgia    Chronic fatigue syndrome    Constipation    Depression    Diabetes mellitus without complication (HCC)    "pre"   Dyspnea    Environmental allergies    Fibromyalgia    GERD (gastroesophageal reflux disease)    Grave's disease    Heart murmur    High cholesterol    History of blood clots    Hypertension    Joint pain    Lactose intolerance    Lower extremity edema    Multiple food allergies    OSA (obstructive sleep apnea)    Osteoporosis    Palpitations    Panic disorder    Rheumatoid arthritis (HCC)    Risk for falls    Sciatica    Severe 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 (HCC)    Sleep apnea    no cpap worn   Syncope    Syncope and collapse    Thyroid disease    Vasovagal syncope    Vertigo    Vitamin D deficiency     Patient Active Problem List   Diagnosis Date Noted   Multiple neurological symptoms 06/23/2021   Protein-calorie malnutrition, mild (HCC) 06/23/2021   Acute respiratory disease due to COVID-19 virus 10/25/2020   AKI (acute kidney injury) (HCC) 10/25/2020   Metabolic syndrome 10/21/2020    Constipation 07/09/2020   Subclinical hypothyroidism 05/11/2019   Prediabetes 05/11/2019   Vitamin D deficiency 05/11/2019   OSA (obstructive sleep apnea) 05/11/2019   Intractable chronic migraine without aura and with status migrainosus 01/14/2016   Risk for falls 09/02/2015   Insomnia 05/27/2015   Severe depressed bipolar II disorder without psychotic features (HCC) 02/25/2015   Class 3 severe obesity with serious comorbidity and body mass index (BMI) of 40.0 to 44.9 in adult (HCC) 11/14/2014   Severe 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 (HCC) 10/10/2014   GAD (generalized anxiety disorder) 10/10/2014   Panic disorder with agoraphobia 10/10/2014   Social anxiety disorder 10/10/2014   Jaw pain-acute TMJ 09/24/14 09/24/2014   Asthma 08/01/2014   Low back pain 07/24/2014   Encounter for monitoring opioid maintenance therapy 07/02/2014   Pharyngoesophageal dysphagia 06/24/2014   S/P gastric bypass 06/24/2014   Protein-calorie malnutrition, severe (HCC) 11/03/2012   Drug overdose, multiple drugs 11/01/2012   Hypokalemia 11/01/2012   Migraine 05/22/2012   Depression 11/22/2011   Chronic pain 11/22/2011  Graves' disease    GERD (gastroesophageal reflux disease)    Need for prophylactic postmenopausal hormone replacement therapy 11/03/2011   Allergic rhinitis 09/28/2011   Anemia 09/28/2011   Essential hypertension 09/28/2011    Past Surgical History:  Procedure Laterality Date   ABDOMINAL HYSTERECTOMY  2006   COLONOSCOPY     GASTRIC BYPASS  2008    OB History     Gravida  2   Para  2   Term      Preterm      AB      Living  2      SAB      IAB      Ectopic      Multiple      Live Births               Home Medications    Prior to Admission medications   Medication Sig Start Date End Date Taking? Authorizing Provider  cephALEXin (KEFLEX) 500 MG capsule Take 2 capsules (1,000 mg total) by mouth 2 (two) times daily. 12/24/21  Yes  Rodriguez-Southworth, Nettie Elm, PA-C  predniSONE (STERAPRED UNI-PAK 21 TAB) 10 MG (21) TBPK tablet Take by mouth daily. Take 6 tabs by mouth daily  for 2 days, then 5 tabs for 2 days, then 4 tabs for 2 days, then 3 tabs for 2 days, 2 tabs for 2 days, then 1 tab by mouth daily for 2 days 12/24/21  Yes Rodriguez-Southworth, Nettie Elm, PA-C  albuterol (VENTOLIN HFA) 108 (90 Base) MCG/ACT inhaler Inhale 2 puffs into the lungs every 6 (six) hours as needed for wheezing. 12/20/19   Whitmire, Thermon Leyland, FNP  azelastine (ASTELIN) 0.1 % nasal spray Place 2 sprays into both nostrils in the morning. Use in each nostril as directed    [provider]  budesonide-formoterol (SYMBICORT) 80-4.5 MCG/ACT inhaler Inhale 2 puffs into the lungs 2 (two) times daily as needed (wheezing/sob).     [provider]  cetirizine (ZYRTEC) 10 MG tablet Take 10 mg by mouth daily. 06/16/21   [provider]  cyclobenzaprine (FLEXERIL) 10 MG tablet Take 10 mg by mouth 3 (three) times daily as needed for muscle spasms. 06/16/21   [provider]  diclofenac sodium (VOLTAREN) 1 % GEL Apply 2 g topically 4 (four) times daily. 12/12/18   Vivi Barrack, DPM  divalproex (DEPAKOTE ER) 500 MG 24 hr tablet Take 2 tablets (1,000 mg total) by mouth daily. 10/29/21   Oletta Darter, MD  doxepin (SINEQUAN) 100 MG capsule Take 1 capsule (100 mg total) by mouth at bedtime. 10/29/21   Oletta Darter, MD  Dulaglutide (TRULICITY) 3 MG/0.5ML SOPN Inject 3 mg into the skin every 30 (thirty) days.    [provider]  estradiol (ESTRACE) 0.5 MG tablet Take 0.5 mg by mouth daily. 07/20/19   [provider]  fluticasone (FLONASE) 50 MCG/ACT nasal spray PLACE 1 SPRAY INTO BOTH NOSTRILS AS NEEDED (ALLERGIES/RUNNY NOSE). 10/22/20   Whitmire, Dawn W, FNP  Galcanezumab-gnlm (EMGALITY) 120 MG/ML SOAJ INJECT 120 MG INTO THE SKIN EVERY 30 DAYS 04/21/21   Anson Fret, MD  hydrochlorothiazide (MICROZIDE) 12.5 MG capsule  TAKE 1 CAPSULE (12.5 MG TOTAL) BY MOUTH DAILY AS NEEDED (EDEMA). 04/20/21   Roswell Nickel, DO  linaclotide Karlene Einstein) 290 MCG CAPS capsule Take 1 capsule (290 mcg total) by mouth daily before breakfast. 07/15/21   Helane Rima, DO  meclizine (ANTIVERT) 25 MG tablet Take 1 tablet (25 mg total) by mouth  3 (three) times daily as needed for dizziness. 09/10/21   Horton, Clabe Seal, DO  meloxicam (MOBIC) 15 MG tablet Take 15 mg by mouth daily. 06/17/20   [provider]  MOVANTIK 25 MG TABS tablet Take 25 mg by mouth daily. 07/27/19   [provider]  Multiple Vitamin (MULTIVITAMIN) tablet Take 1 tablet by mouth daily.    [provider]  phentermine 15 MG capsule Take 1 capsule (15 mg total) by mouth every morning. 08/12/21   Helane Rima, DO  tapentadol HCl (NUCYNTA) 75 MG tablet Take 75 mg by mouth in the morning, at noon, and at bedtime.    [provider]  Thiamine HCl (VITAMIN B-1 PO) Take 1 tablet by mouth daily.     [provider]  Vitamin D, Ergocalciferol, (DRISDOL) 1.25 MG (50000 UNIT) CAPS capsule Take 1 capsule (50,000 Units total) by mouth every 7 (seven) days. 04/07/21   Roswell Nickel, DO    Family History Family History  Problem Relation Age of Onset   Hypertension Mother    Cirrhosis Mother    Alcohol abuse Mother    Depression Mother    Physical abuse Mother    Hyperlipidemia Mother    Thyroid disease Mother    Anxiety disorder Mother    Hypertension Sister    Alcohol abuse Sister    Drug abuse Sister    Depression Sister    Anxiety disorder Sister    OCD Sister    Hypertension Father    Alcohol abuse Father    Hyperlipidemia Father    Depression Father    Drug abuse Father    ADD / ADHD Other    Depression Sister    Diabetes Other    Hypertension Other    Diabetes Maternal Uncle    Seizures Cousin    Dementia Neg Hx    Colon cancer Neg Hx    Esophageal cancer Neg Hx    Rectal cancer Neg Hx    Stomach cancer Neg Hx      Social History Social History   Tobacco Use   Smoking status: Never   Smokeless tobacco: Never  Vaping Use   Vaping Use: Never used  Substance Use Topics   Alcohol use: Not Currently   Drug use: No     Allergies   Bee venom, Coconut fatty acids, Mushroom ext cmplx(shiitake-reishi-mait), Nutritional supplements, Other, Shellfish allergy, Strawberry extract, Hydrocodone-acetaminophen, Latex, and Hydrocodone-acetaminophen   Review of Systems Review of Systems  HENT:  Negative for trouble swallowing.   Respiratory:  Negative for chest tightness and shortness of breath.   Cardiovascular:  Negative for chest pain.  Skin:  Positive for color change. Negative for rash and wound.       + Warmth and pruritus      Physical Exam Triage Vital Signs ED Triage Vitals [12/24/21 1631]  Enc Vitals Group     BP 139/82     Pulse Rate 97     Resp 18     Temp 98.7 F (37.1 C)     Temp Source Oral     SpO2 97 %     Weight      Height      Head Circumference      Peak Flow      Pain Score      Pain Loc      Pain Edu?      Excl. in GC?    No data found.  Updated  Vital Signs BP 139/82   Pulse 97   Temp 98.7 F (37.1 C) (Oral)   Resp 18   SpO2 97%   Visual Acuity Right Eye Distance:   Left Eye Distance:   Bilateral Distance:    Right Eye Near:   Left Eye Near:    Bilateral Near:     Physical Exam Vitals and nursing note reviewed.  Constitutional:      General: She is not in acute distress.    Appearance: She is obese. She is not toxic-appearing.  Eyes:     General: No scleral icterus.    Conjunctiva/sclera: Conjunctivae normal.  Pulmonary:     Effort: Pulmonary effort is normal.  Musculoskeletal:        General: Normal range of motion.     Cervical back: Neck supple.  Skin:    General: Skin is warm and dry.     Findings: Erythema present.     Comments: Her L middle mid finger area is swollen, no stinger noted. Has warmth from this finger to dorsal hand  where there is faint redness up to her wrist. Her ring finger and small finger are mildly swollen and warm.   Neurological:     Mental Status: She is alert and oriented to person, place, and time.     Gait: Gait normal.  Psychiatric:        Mood and Affect: Mood normal.        Behavior: Behavior normal.        Thought Content: Thought content normal.        Judgment: Judgment normal.    UC Treatments / Results  Labs (all labs ordered are listed, but only abnormal results are displayed) Labs Reviewed - No data to display  EKG   Radiology No results found.  Procedures Procedures (including critical care time)  Medications Ordered in UC Medications  methylPREDNISolone sodium succinate (SOLU-MEDROL) 125 mg/2 mL injection 60 mg (60 mg Intramuscular Given 12/24/21 1729)    Initial Impression / Assessment and Plan / UC Course  I have reviewed the triage vital signs and the nursing notes. Local allergic reaction to bee sting and cellulitis She was given Solumedrol 60 mg IM I placed her on Prednisone taper and Keflex as noted.  See instructions.      Final Clinical Impressions(s) / UC Diagnoses   Final diagnoses:  Bee sting allergy  Cellulitis of hand, left  Local reaction to bee sting, accidental or unintentional, initial encounter     Discharge Instructions      Please always go to the ER if you have used your Epi pen so your heart gets monitored.  Start the prednisone tomorrow if the redness and itching are still present Start the antibiotic today     ED Prescriptions     Medication Sig Dispense Auth. Provider   predniSONE (STERAPRED UNI-PAK 21 TAB) 10 MG (21) TBPK tablet Take by mouth daily. Take 6 tabs by mouth daily  for 2 days, then 5 tabs for 2 days, then 4 tabs for 2 days, then 3 tabs for 2 days, 2 tabs for 2 days, then 1 tab by mouth daily for 2 days 42 tablet Rodriguez-Southworth, Nettie Elm, PA-C   cephALEXin (KEFLEX) 500 MG capsule Take 2 capsules (1,000  mg total) by mouth 2 (two) times daily. 28 capsule Rodriguez-Southworth, Nettie Elm, PA-C      PDMP not reviewed this encounter.   Garey Ham, [place] 12/24/21 1733

## 2021-12-30 ENCOUNTER — Encounter: Payer: Self-pay | Admitting: Neurology

## 2021-12-30 ENCOUNTER — Encounter (INDEPENDENT_AMBULATORY_CARE_PROVIDER_SITE_OTHER): Payer: Self-pay

## 2021-12-30 ENCOUNTER — Other Ambulatory Visit (INDEPENDENT_AMBULATORY_CARE_PROVIDER_SITE_OTHER): Payer: Self-pay | Admitting: Bariatrics

## 2021-12-30 DIAGNOSIS — E559 Vitamin D deficiency, unspecified: Secondary | ICD-10-CM

## 2021-12-31 ENCOUNTER — Telehealth (HOSPITAL_BASED_OUTPATIENT_CLINIC_OR_DEPARTMENT_OTHER): Payer: Medicare Other | Admitting: Psychiatry

## 2021-12-31 DIAGNOSIS — F411 Generalized anxiety disorder: Secondary | ICD-10-CM | POA: Diagnosis not present

## 2021-12-31 DIAGNOSIS — F3181 Bipolar II disorder: Secondary | ICD-10-CM | POA: Diagnosis not present

## 2021-12-31 DIAGNOSIS — F4001 Agoraphobia with panic disorder: Secondary | ICD-10-CM

## 2021-12-31 DIAGNOSIS — Z5181 Encounter for therapeutic drug level monitoring: Secondary | ICD-10-CM | POA: Diagnosis not present

## 2021-12-31 DIAGNOSIS — G4700 Insomnia, unspecified: Secondary | ICD-10-CM

## 2021-12-31 MED ORDER — DOXEPIN HCL 100 MG PO CAPS: 100.0000 mg | ORAL_CAPSULE | Freq: Every day | ORAL | 0 refills | Status: DC

## 2021-12-31 MED ORDER — DIVALPROEX SODIUM ER 500 MG PO TB24: 1000.0000 mg | ORAL_TABLET | Freq: Every day | ORAL | 0 refills | Status: DC

## 2021-12-31 NOTE — Progress Notes (Signed)
Virtual Visit via Video Note  I connected with [name] on 12/31/21 at  1:30 PM EDT by   a video enabled telemedicine application and verified that I am speaking with the correct person using two identifiers.  Location: Patient: home Provider: office   I discussed the limitations of evaluation and management by telemedicine and the availability of in person appointments. The patient expressed understanding and agreed to proceed.  History of Present Illness: "I am ok". [name] shares that she has not been up to much and is spending most of her time at home. She goes swimming twice a week with her cousin. [name] is sleeping better. She had a sleep study done and is waiting for the results. [name] is having more nightmares in the early morning. If she is worried about something then she dreams about it. She has a lot of dreams about people who have passed. It happens about 4x/week and it wakes her up. Her anxiety has gotten a little better. If she has to go out then she has anxiety. She has been talking to her son and that helps and she wants to start therapy again. She has about 1 stress induced panic attacks a week. Deep breathing and holding her dog help to calm her. She is spending a lot of time in bed. She is depressed about 3-4 days/week. She wants to start going back to [place] but can't tolerate crowds. She denies SI/HI. Once or a twice she a weeks she has paranoia and visual hallucinations of bees. This started after she got stung by a bee (she is allergic and went to urgent care for treatment) this past weekend. [name] has been spending more money and buying things online. She denies mood lability. Her appetite has improved.[name] has been having more negative self talk lately.    Observations/Objective: Psychiatric Specialty Exam: ROS  There were no vitals taken for this visit.There is no height or weight on file to calculate BMI.  General Appearance: Casual and Fairly Groomed   Eye Contact:  Good  Speech:  Clear and Coherent and Normal Rate  Volume:  Normal  Mood:  Anxious and Depressed  Affect:  Congruent  Thought Process:  Coherent and Descriptions of Associations: Circumstantial  Orientation:  Full (Time, Place, and Person)  Thought Content:  Rumination  Suicidal Thoughts:  No  Homicidal Thoughts:  No  Memory:  Immediate;   Poor  Judgement:  Fair  Insight:  Shallow  Psychomotor Activity:  Normal  Concentration:  Concentration: Poor  Recall:  Poor  Fund of Knowledge:  Good  Language:  Good  Akathisia:  No  Handed:  Right  AIMS (if indicated):     Assets:  Desire for Improvement Financial Resources/Insurance Housing Social Support Talents/Skills Transportation Vocational/Educational  ADL's:  Intact  Cognition:  WNL  Sleep:        Assessment and Plan:     12/31/2021    1:41 PM 09/17/2021   11:37 AM 07/16/2021   11:33 AM 05/07/2021    9:34 AM 04/09/2021    9:01 AM  Depression screen PHQ 2/9  Decreased Interest 2 0 1 2 2   Down, Depressed, Hopeless 2 1 1 1 2   PHQ - 2 Score 4 1 2 3 4   Altered sleeping 3  3 0 2  Tired, decreased energy 3  0 3 3  Change in appetite 1  0 3 0  Feeling bad or failure about yourself  2  1 2  2  Trouble concentrating 2  3 3 2   Moving slowly or fidgety/restless 2  0 0 1  Suicidal thoughts 0  0 0 0  PHQ-9 Score 17  9 14 14   Difficult doing work/chores Extremely dIfficult  Very difficult Very difficult Extremely dIfficult    Flowsheet Row Video Visit from 12/31/2021 in [facility] from 12/24/2021 in [facility] Video Visit from 09/17/2021 in [facility]  C-SSRS RISK CATEGORY No Risk No Risk No Risk        Pt is aware that these meds carry a teratogenic risk. Pt will discuss plan of action if she does or plans to become pregnant in the future.  Status of current problems: ongoing depression and  anxiety  Meds: she wants to continue her current meds and doesn't want any doses changed.  1. Bipolar II disorder (HCC) - divalproex (DEPAKOTE ER) 500 MG 24 hr tablet; Take 2 tablets (1,000 mg total) by mouth daily.  Dispense: 180 tablet; Refill: 0 - doxepin (SINEQUAN) 100 MG capsule; Take 1 capsule (100 mg total) by mouth at bedtime.  Dispense: 90 capsule; Refill: 0  2. Panic disorder with agoraphobia - doxepin (SINEQUAN) 100 MG capsule; Take 1 capsule (100 mg total) by mouth at bedtime.  Dispense: 90 capsule; Refill: 0  3. GAD (generalized anxiety disorder) - doxepin (SINEQUAN) 100 MG capsule; Take 1 capsule (100 mg total) by mouth at bedtime.  Dispense: 90 capsule; Refill: 0  4. Insomnia, unspecified type - doxepin (SINEQUAN) 100 MG capsule; Take 1 capsule (100 mg total) by mouth at bedtime.  Dispense: 90 capsule; Refill: 0  5. Encounter for therapeutic drug monitoring - Valproic acid level - CBC - Comprehensive metabolic panel       Therapy: brief supportive therapy provided. Discussed psychosocial stressors in detail.     Collaboration of Care: Referral or follow-up with counselor/therapist AEB referred to therapy  Patient/Guardian was advised Release of Information must be obtained prior to any record release in order to collaborate their care with an outside provider. Patient/Guardian was advised if they have not already done so to contact the registration department to sign all necessary forms in order for [facility] to release information regarding their care.   Consent: Patient/Guardian gives verbal consent for treatment and assignment of benefits for services provided during this visit. Patient/Guardian expressed understanding and agreed to proceed.      Follow Up Instructions: Follow up in 2-3 months or sooner if needed    I discussed the assessment and treatment plan with the patient. The patient was provided an opportunity to ask questions and all were answered. The  patient agreed with the plan and demonstrated an understanding of the instructions.   The patient was advised to call back or seek an in-person evaluation if the symptoms worsen or if the condition fails to improve as anticipated.  I provided 22 minutes of non-face-to-face time during this encounter.   09/19/2021, MD

## 2022-01-12 ENCOUNTER — Ambulatory Visit: Payer: Medicare Other | Admitting: Neurology

## 2022-01-19 ENCOUNTER — Ambulatory Visit (INDEPENDENT_AMBULATORY_CARE_PROVIDER_SITE_OTHER): Payer: Medicare Other | Admitting: Neurology

## 2022-01-19 ENCOUNTER — Telehealth: Payer: Self-pay | Admitting: Neurology

## 2022-01-19 ENCOUNTER — Ambulatory Visit: Payer: Medicare Other | Admitting: Neurology

## 2022-01-19 DIAGNOSIS — G43711 Chronic migraine without aura, intractable, with status migrainosus: Secondary | ICD-10-CM

## 2022-01-19 MED ORDER — ALPRAZOLAM 0.5 MG PO TABS
ORAL_TABLET | ORAL | 0 refills | Status: DC
Start: 2022-01-19 — End: 2022-04-09

## 2022-01-19 MED ORDER — ONABOTULINUMTOXINA 200 UNITS IJ SOLR
155.0000 [IU] | Freq: Once | INTRAMUSCULAR | Status: AC
  Administered 2022-01-19: 155 [IU] via INTRAMUSCULAR

## 2022-01-19 NOTE — Telephone Encounter (Signed)
Could you call her and make a follow up botox appointment for her? She prefers Monday afternoons that's fine thanks

## 2022-01-19 NOTE — Patient Instructions (Signed)
Alprazolam Tablets What is this medication? ALPRAZOLAM (al PRAY zoe lam) treats anxiety. It works by helping your nervous system calm down. It belongs to a group of medications called benzodiazepines. This medicine may be used for other purposes; ask your health care provider or pharmacist if you have questions. COMMON BRAND NAME(S): Xanax What should I tell my care team before I take this medication? They need to know if you have any of these conditions: Kidney disease Liver disease Lung disease, such as asthma or breathing problems Mental health condition Seizures Substance use disorder Suicidal thoughts, plans, or attempt by you or a family member An unusual or allergic reaction to alprazolam, other medications, foods, dyes, or preservatives Pregnant or trying to get pregnant Breastfeeding How should I use this medication? Take this medication by mouth. Take it as directed on the prescription label. Do not take it more often than directed. Keep taking it unless your care team tells you to stop. A special MedGuide will be given to you by the pharmacist with each prescription and refill. Be sure to read this information carefully each time. Talk to your care team about the use of this medication in children. Special care may be needed. People 65 years and older may have a stronger reaction and need a smaller dose. Overdosage: If you think you have taken too much of this medicine contact a poison control center or emergency room at once. NOTE: This medicine is only for you. Do not share this medicine with others. What if I miss a dose? If you miss a dose, take it as soon as you can. If it is almost time for your next dose, take only that dose. Do not take double or extra doses. What may interact with this medication? Do not take this medication with any of the following: Adagrasib Certain antivirals for HIV or hepatitis Certain medications for fungal infections, such as ketoconazole,  itraconazole, or posaconazole Clarithromycin Grapefruit juice Opioid medications for cough Sodium oxybate This medication may also interact with the following: Alcohol Antihistamines for allergy, cough and cold Certain medications for anxiety or sleep Certain medications for depression, such as amitriptyline, fluoxetine, fluvoxamine, nefazodone, sertraline Certain medications for seizures, such as carbamazepine, phenobarbital, phenytoin, primidone Cimetidine Digoxin Erythromycin Estrogen or progestin hormones General anesthetics, such as halothane, isoflurane, methoxyflurane, propofol Medications that relax muscles Opioid medications for pain Phenothiazines, such as chlorpromazine, mesoridazine, prochlorperazine, thioridazine This list may not describe all possible interactions. Give your health care provider a list of all the medicines, herbs, non-prescription drugs, or dietary supplements you use. Also tell them if you smoke, drink alcohol, or use illegal drugs. Some items may interact with your medicine. What should I watch for while using this medication? Visit your care team for regular checks on your progress. Tell your care team if your symptoms do not start to get better or if they get worse. Do not stop taking except on your care team's advice. You may develop a severe reaction. Your care team will tell you how much medication to take. Long term use of this medication may cause your brain and body to depend on it. This can happen even when used as directed by your care team. You and your care team will work together to determine how long you will need to take this medication. This medication may affect your coordination, reaction time, or judgment. Do not drive or operate machinery until you know how this medication affects you. Sit up or stand slowly to   reduce the risk of dizzy or fainting spells. Drinking alcohol with this medication can increase the risk of these side effects. If  you are taking another medication that also causes drowsiness, you may have more side effects. Give your care team a list of all medications you use. Your care team will tell you how much medication to take. Do not take more medication than directed. Get emergency help right away if you have problems breathing or unusual sleepiness. If you or your family notice any changes in your behavior, such as new or worsening depression, thoughts of harming yourself, anxiety, other unusual or disturbing thoughts, or memory loss, call your care team right away. Talk to your care team if you wish to become pregnant or think you might be pregnant. This medication can cause serious birth defects. Talk to your care team before breastfeeding. Changes to your treatment plan may be needed. What side effects may I notice from receiving this medication? Side effects that you should report to your care team as soon as possible: Allergic reactions--skin rash, itching, hives, swelling of the face, lips, tongue, or throat CNS depression--slow or shallow breathing, shortness of breath, feeling faint, dizziness, confusion, trouble staying awake Thoughts of suicide or self-harm, worsening mood, feelings of depression Side effects that usually do not require medical attention (report to your care team if they continue or are bothersome): Change in sex drive or performance Dizziness Drowsiness Nausea This list may not describe all possible side effects. Call your doctor for medical advice about side effects. You may report side effects to FDA at 1-800-FDA-1088. Where should I keep my medication? Keep out of the reach of children and pets. This medication can be abused. Keep it in a safe place to protect it from theft. Do not share it with anyone. It is only for you. Selling or giving away this medication is dangerous and against the law. Store at room temperature between 20 and 25 degrees C (68 and 77 degrees F). Get rid of any  unused medication after the expiration date. This medication may cause harm and death if it is taken by other adults, children, or pets. It is important to get rid of the medication as soon as you no longer need it, or it is expired. You can do this in two ways: Take the medication to a medication take-back program. Check with your pharmacy or law enforcement to find a location. If you cannot return the medication, check the label or package insert to see if the medication should be thrown out in the garbage or flushed down the toilet. If you are not sure, ask your care team. If it is safe to put it in the trash, take the medication out of the container. Mix the medication with cat litter, dirt, coffee grounds, or other unwanted substance. Seal the mixture in a bag or container. Put it in the trash. NOTE: This sheet is a summary. It may not cover all possible information. If you have questions about this medicine, talk to your doctor, pharmacist, or health care provider.  2023 Elsevier/Gold Standard (2004-06-09 00:00:00)  

## 2022-01-19 NOTE — Progress Notes (Signed)
Botox- 200 units x 1 vial Lot: [id number] Expiration: 06/2024 NDC: 0023-3921-02  Bacteriostatic 0.9% Sodium Chloride- 4mL total Lot: [id number] Expiration: 12/23/2022 NDC: 0409-1966-02  Dx: G43.711  B/B  

## 2022-01-19 NOTE — Telephone Encounter (Signed)
Called pt to schedule next Botox appt, she stated she would call back to schedule.

## 2022-01-19 NOTE — Progress Notes (Addendum)
Consent Form Botulism Toxin Injection For Chronic Migraine  01/19/2022: Super anxious, but doing very well with botox, > 50% improvement in migraine freq and severity. Gave xanax to use prior to injections, discussed risks of taking xanax with nucynta including resp failure and death, discussed take one tablet 30-60 minutes before botox procedure, do not take at any other time, she undertood and agreed. will discuss disposing of any unused medication at next appointment  Meds ordered this encounter  Medications   botulinum toxin Type A (BOTOX) injection 155 Units    Lot: [id number] Expiration: 06/2024 NDC: 5409-8119-14  B&B   ALPRAZolam (XANAX) 0.5 MG tablet    Sig: Take 30 minutes to one hour prior to procedure. Do not drive, have someone frive you to appointment.    Dispense:  30 tablet    Refill:  0    10/27/2021: stable doing well. She lost 30 pounds, her hair looks great, she had an episode of dizziness and weakness wasn;t eating or drinking after a breakup, she has had some falls due to right leg weakness pending mri lumbar spine we wll see her on the 22nd.   07/21/2021: She gets > 70% relief migraine frequency, extremely anxious, do the botox fast, +a  04/21/2021: stable, still doing same if not better > 60% improvement. She broke up with her boyfriend. Patient states she did not take the xanax, she is here alone, I warned her not to drive if she takes xanax or clonazepam and have someone drive her.   7/82/9562: getting back on track. Will give some xanax for next time she is incredibly stressed when she comes in, she is lovely, here with fiance  Interval history 04/28/2020: Doing great, More than 60% improvement in frequency but also in severity. Ajovy not approve using emgality instead. she has failed imitrex and maxalt and cannot take anymore triptans, contraindicated due to side effects, gave her some samples. She feels clenching is a problem and she has pain when clenching in the  temples as well and we give her muscle relaxers since we cannot put botox there. She had HTN on aimovig, stopped. She had botox in the past and we restarted on 03/2019, doing great. Loves emgality as an add on as well. No date set for her wedding yet, it has been 2.5 years. She is very anxious and took anxiety medication today and did better but she is very anxious. She wants an outdoor wedding. She did much better, she took her anxiety and pain meds. [facility]    Reviewed orally with patient, additionally signature is on file:  Botulism toxin has been approved by the Federal drug administration for treatment of chronic migraine. Botulism toxin does not cure chronic migraine and it may not be effective in some patients.  The administration of botulism toxin is accomplished by injecting a small amount of toxin into the muscles of the neck and head. Dosage must be titrated for each individual. Any benefits resulting from botulism toxin tend to wear off after 3 months with a repeat injection required if benefit is to be maintained. Injections are usually done every 3-4 months with maximum effect peak achieved by about 2 or 3 weeks. Botulism toxin is expensive and you should be sure of what costs you will incur resulting from the injection.  The side effects of botulism toxin use for chronic migraine may include:   -Transient, and usually mild, facial weakness with  facial injections  -Transient, and usually mild, head or neck weakness with head/neck injections  -Reduction or loss of forehead facial animation due to forehead muscle weakness  -Eyelid drooping  -Dry eye  -Pain at the site of injection or bruising at the site of injection  -Double vision  -Potential unknown long term risks  Contraindications: You should not have Botox if you are pregnant, nursing, allergic to albumin, have an infection, skin condition, or muscle weakness at the site of  the injection, or have myasthenia gravis, Lambert-Eaton syndrome, or ALS.  It is also possible that as with any injection, there may be an allergic reaction or no effect from the medication. Reduced effectiveness after repeated injections is sometimes seen and rarely infection at the injection site may occur. All care will be taken to prevent these side effects. If therapy is given over a long time, atrophy and wasting in the muscle injected may occur. Occasionally the patient's become refractory to treatment because they develop antibodies to the toxin. In this event, therapy needs to be modified.  I have read the above information and consent to the administration of botulism toxin.    BOTOX PROCEDURE NOTE FOR MIGRAINE HEADACHE    Contraindications and precautions discussed with patient(above). Aseptic procedure was observed and patient tolerated procedure. Procedure performed by Dr. Artemio Aly  The condition has existed for more than 6 months, and pt does not have a diagnosis of ALS, Myasthenia Gravis or Lambert-Eaton Syndrome.  Risks and benefits of injections discussed and pt agrees to proceed with the procedure.  Written consent obtained  These injections are medically necessary. Pt  receives good benefits from these injections. These injections do not cause sedations or hallucinations which the oral therapies may cause.  Description of procedure:  The patient was placed in a sitting position. The standard protocol was used for Botox as follows, with 5 units of Botox injected at each site:   -Procerus muscle, midline injection  -Corrugator muscle, bilateral injection  -Frontalis muscle, bilateral injection, with 2 sites each side, medial injection was performed in the upper one third of the frontalis muscle, in the region vertical from the medial inferior edge of the superior orbital rim. The lateral injection was again in the upper one third of the forehead vertically above the  lateral limbus of the cornea, 1.5 cm lateral to the medial injection site.  -Temporalis muscle injection, 4 sites, bilaterally. The first injection was 3 cm above the tragus of the ear, second injection site was 1.5 cm to 3 cm up from the first injection site in line with the tragus of the ear. The third injection site was 1.5-3 cm forward between the first 2 injection sites. The fourth injection site was 1.5 cm posterior to the second injection site.   -Occipitalis muscle injection, 3 sites, bilaterally. The first injection was done one half way between the occipital protuberance and the tip of the mastoid process behind the ear. The second injection site was done lateral and superior to the first, 1 fingerbreadth from the first injection. The third injection site was 1 fingerbreadth superiorly and medially from the first injection site.  -Cervical paraspinal muscle injection, 2 sites, bilateral knee first injection site was 1 cm from the midline of the cervical spine, 3 cm inferior to the lower border of the occipital protuberance. The second injection site was 1.5 cm superiorly and laterally to the first injection site.  -Trapezius muscle injection was performed at 3 sites, bilaterally. The  first injection site was in the upper trapezius muscle halfway between the inflection point of the neck, and the acromion. The second injection site was one half way between the acromion and the first injection site. The third injection was done between the first injection site and the inflection point of the neck.   Will return for repeat injection in 3 months.   155 units of Botox was used, 45u Botox not injected was wasted. The patient tolerated the procedure well, there were no complications of the above procedure.

## 2022-01-24 ENCOUNTER — Other Ambulatory Visit: Payer: Self-pay

## 2022-01-24 ENCOUNTER — Emergency Department (HOSPITAL_COMMUNITY): Payer: Medicare Other

## 2022-01-24 ENCOUNTER — Emergency Department (HOSPITAL_COMMUNITY)
Admission: EM | Admit: 2022-01-24 | Discharge: 2022-01-25 | Disposition: A | Discharge status: Home / Self Care | Payer: Medicare Other | Attending: Emergency Medicine | Admitting: Emergency Medicine

## 2022-01-24 ENCOUNTER — Encounter (HOSPITAL_COMMUNITY): Payer: Self-pay | Admitting: Emergency Medicine

## 2022-01-24 DIAGNOSIS — J45909 Unspecified asthma, uncomplicated: Secondary | ICD-10-CM | POA: Diagnosis not present

## 2022-01-24 DIAGNOSIS — I10 Essential (primary) hypertension: Secondary | ICD-10-CM | POA: Diagnosis not present

## 2022-01-24 DIAGNOSIS — E119 Type 2 diabetes mellitus without complications: Secondary | ICD-10-CM | POA: Insufficient documentation

## 2022-01-24 DIAGNOSIS — K5903 Drug induced constipation: Secondary | ICD-10-CM | POA: Insufficient documentation

## 2022-01-24 LAB — BASIC METABOLIC PANEL
Anion gap: 10 (ref 5–15)
BUN: 20 mg/dL (ref 6–20)
CO2: 26 mmol/L (ref 22–32)
Calcium: 9 mg/dL (ref 8.9–10.3)
Chloride: 104 mmol/L (ref 98–111)
Creatinine, Ser: 0.88 mg/dL (ref 0.44–1.00)
GFR, Estimated: >60 mL/min (ref >60)
Glucose, Bld: 69 mg/dL — ABNORMAL LOW (ref 70–99)
Potassium: 4.4 mmol/L (ref 3.5–5.1)
Sodium: 140 mmol/L (ref 135–145)

## 2022-01-24 LAB — HEPATIC FUNCTION PANEL
ALT: 12 U/L (ref 0–44)
AST: 17 U/L (ref 15–41)
Albumin: 3.6 g/dL (ref 3.5–5.0)
Alkaline Phosphatase: 67 U/L (ref 38–126)
Bilirubin, Direct: <0.1 mg/dL (ref 0.0–0.2)
Total Bilirubin: 0.3 mg/dL (ref 0.3–1.2)
Total Protein: 6.9 g/dL (ref 6.5–8.1)

## 2022-01-24 LAB — CBC
HCT: 38.1 % (ref 36.0–46.0)
Hemoglobin: 12.2 g/dL (ref 12.0–15.0)
MCH: 28.7 pg (ref 26.0–34.0)
MCHC: 32 g/dL (ref 30.0–36.0)
MCV: 89.6 fL (ref 80.0–100.0)
Platelets: 230 10*3/uL (ref 150–400)
RBC: 4.25 MIL/uL (ref 3.87–5.11)
RDW: 13.9 % (ref 11.5–15.5)
WBC: 4.7 10*3/uL (ref 4.0–10.5)
nRBC: 0 % (ref 0.0–0.2)

## 2022-01-24 LAB — LIPASE, BLOOD: Lipase: 26 U/L (ref 11–51)

## 2022-01-24 NOTE — ED Triage Notes (Signed)
Pt c/o constipation, LBM x 9 days ago. States she was able to manually get out a small amount of hard stool.

## 2022-01-25 MED ORDER — POLYETHYLENE GLYCOL 3350 17 G PO PACK
17.0000 g | PACK | Freq: Every day | ORAL | 1 refills | Status: DC
Start: 2022-01-25 — End: 2022-06-01

## 2022-01-25 MED ORDER — BISACODYL 10 MG RE SUPP: 10.0000 mg | RECTAL | 0 refills | Status: DC | PRN

## 2022-01-25 MED ORDER — GOLYTELY 236 G PO SOLR: 4000.0000 mL | Freq: Once | ORAL | 0 refills | Status: AC

## 2022-01-25 NOTE — ED Provider Notes (Signed)
[facility]PT [facility] Emergency Department Provider Note MRN:  [id number]  Arrival date & time: 01/25/22     Chief Complaint   Constipation   History of Present Illness   [name] is a 56 y.o. year-old female with no pertinent past medical history presenting to the ED with chief complaint of constipation.  No bowel movement for about 1 week.  Passing gas without issue, abdominal fullness but no pain.  No nausea or vomiting.  Started Ozempic the week before, thinks this is related.  Review of Systems  A thorough review of systems was obtained and all systems are negative except as noted in the HPI and PMH.   Patient's Health History    Past Medical History:  Diagnosis Date   Allergic rhinitis    Allergic to cats    pet dander   Allergy    Anemia    Anxiety    Arthritis 09/2018   both feet   Asthma    Back pain    Bipolar disorder (HCC)    Cervicalgia    Chronic fatigue syndrome    Constipation    Depression    Diabetes mellitus without complication (HCC)    "pre"   Dyspnea    Environmental allergies    Fibromyalgia    GERD (gastroesophageal reflux disease)    Grave's disease    Heart murmur    High cholesterol    History of blood clots    Hypertension    Joint pain    Lactose intolerance    Lower extremity edema    Multiple food allergies    OSA (obstructive sleep apnea)    Osteoporosis    Palpitations    Panic disorder    Rheumatoid arthritis (HCC)    Risk for falls    Sciatica    Severe 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 (HCC)    Sleep apnea    no cpap worn   Syncope    Syncope and collapse    Thyroid disease    Vasovagal syncope    Vertigo    Vitamin D deficiency     Past Surgical History:  Procedure Laterality Date   ABDOMINAL HYSTERECTOMY  2006   COLONOSCOPY     GASTRIC BYPASS  2008    Family History  Problem Relation Age of Onset   Hypertension Mother    Cirrhosis Mother    Alcohol abuse  Mother    Depression Mother    Physical abuse Mother    Hyperlipidemia Mother    Thyroid disease Mother    Anxiety disorder Mother    Hypertension Sister    Alcohol abuse Sister    Drug abuse Sister    Depression Sister    Anxiety disorder Sister    OCD Sister    Hypertension Father    Alcohol abuse Father    Hyperlipidemia Father    Depression Father    Drug abuse Father    ADD / ADHD Other    Depression Sister    Diabetes Other    Hypertension Other    Diabetes Maternal Uncle    Seizures Cousin    Dementia Neg Hx    Colon cancer Neg Hx    Esophageal cancer Neg Hx    Rectal cancer Neg Hx    Stomach cancer Neg Hx     Social History   Socioeconomic History   Marital status: Divorced    Spouse name: Not on file   Number  of children: 2   Years of education: 14   Highest education level: Not on file  Occupational History   Occupation: Disabled  Tobacco Use   Smoking status: Never   Smokeless tobacco: Never  Vaping Use   Vaping Use: Never used  Substance and Sexual Activity   Alcohol use: Not Currently   Drug use: No   Sexual activity: Not on file  Other Topics Concern   Not on file  Social History Narrative   Lives alone. Caffeine use: sometimes per patient.   Social Determinants of Health   Financial Resource Strain: Not on file  Food Insecurity: Not on file  Transportation Needs: Not on file  Physical Activity: Not on file  Stress: Not on file  Social Connections: Not on file  Intimate Partner Violence: Not on file     Physical Exam   Vitals:   01/25/22 0515 01/25/22 0528  BP: 103/70   Pulse: 86   Resp: 17   Temp:  97.9 F (36.6 C)  SpO2: 96%     CONSTITUTIONAL: Well-appearing, NAD NEURO/PSYCH:  Alert and oriented x 3, no focal deficits EYES:  eyes equal and reactive ENT/NECK:  no LAD, no JVD CARDIO: Regular rate, well-perfused, normal S1 and S2 PULM:  CTAB no wheezing or rhonchi GI/GU:  non-distended, non-tender MSK/SPINE:  No gross  deformities, no edema SKIN:  no rash, atraumatic   *Additional and/or pertinent findings included in MDM below  Diagnostic and Interventional Summary    EKG Interpretation  Date/Time:    Ventricular Rate:    PR Interval:    QRS Duration:   QT Interval:    QTC Calculation:   R Axis:     Text Interpretation:         Labs Reviewed  BASIC METABOLIC PANEL - Abnormal; Notable for the following components:      Result Value   Glucose, Bld 69 (*)    All other components within normal limits  CBC  HEPATIC FUNCTION PANEL  LIPASE, BLOOD    DG ABD ACUTE 2+V W 1V CHEST  Final Result      Medications - No data to display   Procedures  /  Critical Care Procedures  ED Course and Medical Decision Making  Initial Impression and Ddx Abdomen is soft and nontender, normal vital signs, given the history this seems like an uncomplicated constipation, known side effect of Ozempic.  Needs a more aggressive bowel regimen, will provide.  No indication for further testing or admission, appropriate for discharge.  Past medical/surgical history that increases complexity of ED encounter: None  Interpretation of Diagnostics I personally reviewed the laboratory assessment and my interpretation is as follows: No significant blood count or electrolyte disturbance  Plain films without evidence of SBO or free air, supports the diagnosis of constipation.  Patient Reassessment and Ultimate Disposition/Management     Discharge  Patient management required discussion with the following services or consulting groups:  None  Complexity of Problems Addressed Acute illness or injury that poses threat of life of bodily function  Additional Data Reviewed and Analyzed Further history obtained from: None  Additional Factors Impacting ED Encounter Risk Prescriptions  Elmer Sow. Pilar Plate, MD [facility] Emergency Medicine [facility] [email or web address]  Final Clinical  Impressions(s) / ED Diagnoses     ICD-10-CM   1. Drug-induced constipation  K59.03       ED Discharge Orders          Ordered  polyethylene glycol (MIRALAX) 17 g packet  Daily        01/25/22 0536    bisacodyl (DULCOLAX) 10 MG suppository  As needed        01/25/22 0536             Discharge Instructions Discussed with and Provided to Patient:    Discharge Instructions      You were evaluated in the Emergency Department and after careful evaluation, we did not find any emergent condition requiring admission or further testing in the hospital.  Your exam/testing today is overall reassuring.  Symptoms likely due to constipation.  Use the MiraLAX as we discussed, also recommend using the suppositories as needed.  Please return to the Emergency Department if you experience any worsening of your condition.   Thank you for allowing [name] to be a part of your care.      Sabas Sous, MD 01/25/22 [phone]

## 2022-01-25 NOTE — Discharge Instructions (Signed)
You were evaluated in the Emergency Department and after careful evaluation, we did not find any emergent condition requiring admission or further testing in the hospital.  Your exam/testing today is overall reassuring.  Symptoms likely due to constipation.  Use the MiraLAX as we discussed, also recommend using the suppositories as needed.  Please return to the Emergency Department if you experience any worsening of your condition.   Thank you for allowing [name] to be a part of your care.

## 2022-01-28 ENCOUNTER — Encounter: Payer: Self-pay | Admitting: Neurology

## 2022-01-28 ENCOUNTER — Ambulatory Visit (INDEPENDENT_AMBULATORY_CARE_PROVIDER_SITE_OTHER): Payer: Medicare Other | Admitting: Neurology

## 2022-01-28 VITALS — BP 115/75 | HR 97 | Ht 66.0 in | Wt 209.0 lb

## 2022-01-28 DIAGNOSIS — M5417 Radiculopathy, lumbosacral region: Secondary | ICD-10-CM | POA: Insufficient documentation

## 2022-01-28 NOTE — Addendum Note (Signed)
Addended by: Naomie Dean B on: 01/28/2022 02:31 PM   Modules accepted: Level of Service

## 2022-01-28 NOTE — Progress Notes (Addendum)
[facility]    Provider:  Dr Lucia Gaskins Referring Provider: Iona Hansen, NP Primary Care Physician:  Iona Hansen, NP  CC: Left lower extremity  January 28, 2022: This is a patient we have been seeing for several years, I last saw her in the office October 2020 however I see her every 3 months for Botox injections for chronic migraines.  She is here today for left lower extremity weakness, falls.  We have also seen her in the past for memory changes sleep study was negative, formal neurocognitive testing was negative for degenerative neurocognitive disease.  Today she is sitting in the dark in the exam room with a migraine. She says she has fibromyalgia, weak most of the time, down the left leg to below the knee lateral leg. Ongoing for 3 years. She falls a lot because of weakness in the left leg. No problems in the arms or face with the leg issues, isolated to the left leg. Both legs but worse on the left leg. Multiple falls. She needs to get help to get up.   08/2021: MRI brain: IMPRESSION: 1. Unchanged distribution of few nonspecific foci of hyperintense T2-weighted signal in the left frontal white matter. No 2. Normal cervical spinal cord without demyelination. 3. Moderate right C6 and mild bilateral C7 foraminal stenosis.  MRI cervical 09/10/2021: C1-2: Unremarkable.   C2-3: Normal disc space and facet joints. There is no spinal canal stenosis. No neural foraminal stenosis.   C3-4: Normal disc space and facet joints. There is no spinal canal stenosis. No neural foraminal stenosis.   C4-5: Normal disc space and facet joints. There is no spinal canal stenosis. No neural foraminal stenosis.   C5-6: Right-greater-than-left uncovertebral hypertrophy. There is no spinal canal stenosis. Moderate right neural foraminal stenosis.   C6-7: Bilateral uncovertebral hypertrophy. There is no spinal canal stenosis. Mild bilateral neural foraminal stenosis.   C7-T1:  Normal disc space and facet joints. There is no spinal canal stenosis. No neural foraminal stenosis.   IMPRESSION: 1. Unchanged distribution of few nonspecific foci of hyperintense T2-weighted signal in the left frontal white matter. No 2. Normal cervical spinal cord without demyelination. 3. Moderate right C6 and mild bilateral C7 foraminal stenosis.   MRi Lumbar spine: Disc levels:   Unless otherwise stated, the level by level findings below have not significantly changed from the prior MRI of 02/18/2020.   No more than mild disc degeneration within the lumbar or visualized lower thoracic spine.   T11-T12: This level is imaged in the sagittal plane only. Trace grade 1 retrolisthesis. Shallow disc bulge. Facet arthrosis/ligamentum flavum hypertrophy. No significant spinal canal or foraminal stenosis.   T12-L1: No significant disc herniation or stenosis.   L1-L2: Trace grade 1 retrolisthesis. No significant disc herniation or stenosis.   L2-L3: Disc bulge. Mild-to-moderate facet arthrosis (greater on the right) with ligamentum flavum hypertrophy. No significant spinal canal stenosis. Mild right neural foraminal narrowing.   L3-L4: Trace right anterolisthesis. Disc bulge. Moderate facet arthrosis with ligamentum flavum hypertrophy. Mild relative spinal canal narrowing. Mild bilateral neural foraminal narrowing.   L4-L5: Trace grade 1 retrolisthesis. Disc bulge. Moderate facet arthrosis with ligamentum flavum hypertrophy. A previously demonstrated ventrally projecting right-sided synovial facet cyst is no longer appreciated. Progressive mild to moderate left subarticular stenosis with some crowding of the descending left L5 nerve root (series 6, image 32). Mild relative narrowing of the spinal canal. Mild left neural foraminal narrowing.   L5-S1: Disc bulge asymmetric to the left.  Associated endplate osteophytes along the left aspect of the disc space. Mild to moderate  facet arthrosis on the left. Mild left subarticular/narrowing (without appreciable nerve root impingement at this site). No significant central canal stenosis. Mild-to-moderate left neural foraminal narrowing.   IMPRESSION: At L4-L5, there is progressive multifactorial mild-to-moderate left subarticular stenosis with some crowding of the descending left L5 nerve root. Mild central canal and mild left neural foraminal narrowing at this level are unchanged. A previously demonstrated right-sided ventrally projecting synovial facet cyst is no longer appreciated.   Lumbar spondylosis, as outlined and otherwise not significantly from the prior MRI of 02/18/2020. No more than mild spinal canal narrowing at the remaining levels. Additional sites of foraminal stenosis, as detailed and greatest on the left at L5-S1 (mild-to-moderate at this site).   Minimal multilevel grade 1 spondylolisthesis.   Patient complains of symptoms per HPI as well as the following symptoms: left leg pain . Pertinent negatives and positives per HPI. All others negative   02/22/2019: She is having elevated BP. Stop Aimovig. Also try to recert botox. She has OSA and is not using a machine, she had a sleep study and it was negative 2 years ago. Formal neurocog testing neg for degenerate neurocognitive disease. We discussed her formal memory testing and I advised she meet with Dr. Vikki Ports for any questions. We also discussed options for her migraines, she feels botox was most helpful. Stop cgrp.  Also try to get botox again.   She has failed imitrex and maxalt, side effects, contraindicated, will try nurtec. Also gave ajovy samples.   Daily headaches. No aura. Ongoing for over 2 year at daily headache frequency and >15 days a month moderately severe to severe headaches, no medication overuse. Migraine last 24-72 hours. Migraines are pressure on the left, pounding, throbbing, light sensitivity, sound sensitivity,nausea,  movement makes it worse.  MEds tried: topamax, aimovig(stopped), flexeril, depakote, gabapentin, robaxin, verapamil, tizanidinem imitrex, paxil  HP 11/07/2018: This is a 56 year old female here as a new request from Eldridge Abrahams, NP for memory changes.  I have seen her in the past for migraines and treated her with Botox, medications and suggested the new CGRP medications.  She has a past medical history of hypertension, high cholesterol, chronic pain, migraine, depression, anxiety, fibromyalgia, vertigo, syncope, sciatica, bipolar, morbid obesity, severe depressed bipolar 2 disorder. Her short term memory is impaired, long term as well. Going on years and getting worse. She forgets where she is going. She has to put address in to remember how to get to doctor. She can;t remember what she ate for dinner. She has all bills get paid by patient, no accidents in the home, she has recurrent syncope.   Migraines; Started Aimovig, she will be having her 2nd shot and feels improved. Second shot was yesterday. Ongoing migraine for 3 days. Try Toradol today.   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  The overall AHI is in the normal range.  CPAP therapy was not warranted.  Weight loss was recommended.  I reviewed the sleep test data and agree with the above.  I personally reviewed MRI of the brain March 21, 2018 which showed some nonspecific white matter changes overall normal for age.  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  HPI 01/13/2016:  ASTA STEVENSON is a 56 y.o. female here as a referral from Dr. Ronnald Ramp for migraines. Past  medical history of hypertension, high cholesterol, chronic pain, migraine, depression, anxiety, fibromyalgia, vertigo syncope, sciatica, bipolar, morbid obesity, severe depressed bipolar 2 disorder. She has migraines daily, nothing makes them better. She had migraines for 19 days straight in July. Thought it was a sinus infection. She was placed on Topiramate many years ago for migraines and she continues to take it for 13-14s year, no inciting events or head trauma in the past, no family history of migraines. She is taking 100 mg in the morning and 100 mg at night of Topamax.  Migraines are pressure on the left, pounding, throbbing, light sensitivity, sound sensitivity, every day. She has to wear her shades inside the house which makes her feel better. She takes oxycodone prescribed for her sciatica and joint pain every 3-4 days for the migraines. Discussed rebound headaches. No aura. No vision changes, no dysarthria, no dysphasia, no meningismus. These are the same migraines that she's been having for many years, no change in severity or frequency. Stress makes them worse and she has significant depression. Smells trigger her migraines. Migraines can be severe.  Reviewed notes, labs and imaging from outside physicians, which showed:   Reviewed images of MRI brain personally on CD 12/25/2015 and agree with the following: FINDINGS: No evidence for acute infarction, hemorrhage, mass lesion, hydrocephalus, or extra-axial fluid. Normal for age cerebral volume. Minor subcortical white matter disease on the LEFT, 3-4 foci of T2 and FLAIR hyperintensity, subcentimeter size frontal region. No similar findings on the RIGHT or in the periventricular white matter. Flow voids are maintained. Artifactual gradient sequence, but no definite foci of chronic hemorrhage. The extracranial soft tissues are unremarkable.  CBC and BMP were normal in April 2017 including creatinine of 0.81.  Review of Systems: Patient complains  of symptoms per HPI as well as the following symptoms: Weight gain, fatigue, blurred vision, chest pain, palpitations, shortness of breath, wheezing, feeling hot, feeling cold, increased thirst, flushing, joint pain, cramps, aching muscles, allergies, runny nose, skin sensitivity, frequent infections, memory loss, confusion, headache, numbness, weakness, insomnia, sleepiness, restless, dizziness, passing out, depression, anxiety, not enough sleep, decreased energy, change in appetite, disinterest in activities, suicidal thoughts, racing thoughts. Pertinent negatives per HPI. All others negative.   Social History   Socioeconomic History   Marital status: Divorced    Spouse name: Not on file   Number of children: 2   Years of education: 14   Highest education level: Not on file  Occupational History   Occupation: Disabled  Tobacco Use   Smoking status: Never   Smokeless tobacco: Never  Vaping Use   Vaping Use: Never used  Substance and Sexual Activity   Alcohol use: Not Currently   Drug use: No   Sexual activity: Not on file  Other Topics Concern   Not on file  Social History Narrative   Son lives with her. Caffeine use: 1 cup/day of soda   Social Determinants of Health   Financial Resource Strain: Not on file  Food Insecurity: Not on file  Transportation Needs: Not on file  Physical Activity: Not on file  Stress: Not on file  Social Connections: Not on file  Intimate Partner Violence: Not on file    Family History  Problem Relation Age of Onset  Hypertension Mother    Cirrhosis Mother    Alcohol abuse Mother    Depression Mother    Physical abuse Mother    Hyperlipidemia Mother    Thyroid disease Mother    Anxiety disorder Mother    Arthritis Mother    Hypertension Father    Alcohol abuse Father    Hyperlipidemia Father    Depression Father    Drug abuse Father    Arthritis Father    COPD Father    Gout Father    Hypertension Sister    Alcohol abuse Sister     Drug abuse Sister    Depression Sister    Anxiety disorder Sister    OCD Sister    Thyroid disease Sister    Depression Sister    Thyroid disease Sister    Stroke Maternal Grandmother    Heart attack Maternal Grandmother    Diabetes Maternal Uncle    Stroke Maternal Uncle    Colon cancer Maternal Uncle    Seizures Cousin    Breast cancer Cousin    Breast cancer Cousin    ADD / ADHD Other    Diabetes Other    Hypertension Other    Dementia Neg Hx    Esophageal cancer Neg Hx    Rectal cancer Neg Hx    Stomach cancer Neg Hx     Past Medical History:  Diagnosis Date   Acute kidney injury ([place]) 10/25/2020   Allergic rhinitis    Allergic to cats    pet dander   Allergy    Anemia    Anxiety    Arthritis 09/2018   both feet   Asthma    Back pain    Bipolar disorder ([place])    Cervicalgia    Chronic fatigue syndrome    Chronic low back pain with left-sided sciatica    Constipation    Depression    Diabetes mellitus without complication ([place])    "pre"   Dyspnea    Environmental allergies    Fibromyalgia    GERD (gastroesophageal reflux disease)    Grave's disease    Heart murmur    High cholesterol    History of blood clots    Hypertension    Joint pain    Lactose intolerance    Lower extremity edema    Multiple food allergies    OSA (obstructive sleep apnea)    Osteoporosis    Palpitations    Panic disorder    Rheumatoid arthritis ([place])    Risk for falls    Sciatica    Severe 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 ([place])    Sleep apnea    no cpap worn   Syncope    Syncope and collapse    Thyroid disease    Vasovagal syncope    Vertigo    Vitamin D deficiency     Past Surgical History:  Procedure Laterality Date   ABDOMINAL HYSTERECTOMY  2006   COLONOSCOPY     GASTRIC BYPASS  2008    Current Outpatient Medications  Medication Sig Dispense Refill   albuterol (VENTOLIN HFA) 108 (90 Base) MCG/ACT inhaler Inhale 2 puffs into  the lungs every 6 (six) hours as needed for wheezing. 18 g 2   ALPRAZolam (XANAX) 0.5 MG tablet Take 30 minutes to one hour prior to procedure. Do not drive, have someone frive you to appointment. 30 tablet 0   azelastine (ASTELIN) 0.1 % nasal spray Place 2 sprays into  both nostrils in the morning. Use in each nostril as directed     bisacodyl (DULCOLAX) 10 MG suppository Place 1 suppository (10 mg total) rectally as needed for moderate constipation. 12 suppository 0   budesonide-formoterol (SYMBICORT) 80-4.5 MCG/ACT inhaler Inhale 2 puffs into the lungs 2 (two) times daily as needed (wheezing/sob).      cephALEXin (KEFLEX) 500 MG capsule Take 2 capsules (1,000 mg total) by mouth 2 (two) times daily. 28 capsule 0   cetirizine (ZYRTEC) 10 MG tablet Take 10 mg by mouth daily.     cyclobenzaprine (FLEXERIL) 10 MG tablet Take 10 mg by mouth 3 (three) times daily as needed for muscle spasms.     diclofenac sodium (VOLTAREN) 1 % GEL Apply 2 g topically 4 (four) times daily. 100 g 1   divalproex (DEPAKOTE ER) 500 MG 24 hr tablet Take 2 tablets (1,000 mg total) by mouth daily. 180 tablet 0   doxepin (SINEQUAN) 100 MG capsule Take 1 capsule (100 mg total) by mouth at bedtime. 90 capsule 0   Dulaglutide (TRULICITY) 3 0000000 SOPN Inject 3 mg into the skin every 30 (thirty) days.     estradiol (ESTRACE) 0.5 MG tablet Take 0.5 mg by mouth daily.     fluticasone (FLONASE) 50 MCG/ACT nasal spray PLACE 1 SPRAY INTO BOTH NOSTRILS AS NEEDED (ALLERGIES/RUNNY NOSE). 16 mL 0   Galcanezumab-gnlm (EMGALITY) 120 MG/ML SOAJ INJECT 120 MG INTO THE SKIN EVERY 30 DAYS 3 mL 11   hydrochlorothiazide (MICROZIDE) 12.5 MG capsule TAKE 1 CAPSULE (12.5 MG TOTAL) BY MOUTH DAILY AS NEEDED (EDEMA). 30 capsule 0   linaclotide (LINZESS) 290 MCG CAPS capsule Take 1 capsule (290 mcg total) by mouth daily before breakfast. 90 capsule 3   meloxicam (MOBIC) 15 MG tablet Take 15 mg by mouth daily.     MOVANTIK 25 MG TABS tablet Take 25 mg by  mouth daily.     Multiple Vitamin (MULTIVITAMIN) tablet Take 1 tablet by mouth daily.     phentermine 15 MG capsule Take 1 capsule (15 mg total) by mouth every morning. 90 capsule 0   polyethylene glycol (MIRALAX) 17 g packet Take 17 g by mouth daily. 30 each 1   tapentadol HCl (NUCYNTA) 75 MG tablet Take 75 mg by mouth in the morning, at noon, and at bedtime.     Vitamin D, Ergocalciferol, (DRISDOL) 1.25 MG (50000 UNIT) CAPS capsule Take 1 capsule (50,000 Units total) by mouth every 7 (seven) days. 12 capsule 0   predniSONE (STERAPRED UNI-PAK 21 TAB) 10 MG (21) TBPK tablet Take by mouth daily. Take 6 tabs by mouth daily  for 2 days, then 5 tabs for 2 days, then 4 tabs for 2 days, then 3 tabs for 2 days, 2 tabs for 2 days, then 1 tab by mouth daily for 2 days (Patient not taking: Reported on 01/28/2022) 42 tablet 0   No current facility-administered medications for this visit.    Allergies as of 01/28/2022 - Review Complete 01/28/2022  Allergen Reaction Noted   Bee venom Anaphylaxis 10/10/2014   Coconut fatty acids Anaphylaxis 04/27/2016   Mushroom ext cmplx(shiitake-reishi-mait) Anaphylaxis 05/05/2011   Nutritional supplements Anaphylaxis and Itching 05/05/2011   Other Anaphylaxis, Hives, and Itching 02/13/2015   Shellfish allergy Anaphylaxis 05/05/2011   Strawberry extract Anaphylaxis 02/13/2015   Hydrocodone-acetaminophen Itching 08/13/2008   Latex  10/25/2020   Molds & smuts  01/28/2022   Hydrocodone-acetaminophen Rash 10/13/2010    Vitals: BP 115/75 (BP Location: Left Arm, Patient  Position: Sitting, Cuff Size: Large)   Pulse 97   Ht 5\' 6"  (1.676 m)   Wt 209 lb (94.8 kg)   BMI 33.73 kg/m  Last Weight:  Wt Readings from Last 1 Encounters:  01/28/22 209 lb (94.8 kg)   Last Height:   Ht Readings from Last 1 Encounters:  01/28/22 5\' 6"  (1.676 m)    Exam: NAD, pleasant                  Speech:    Speech is normal; fluent and spontaneous with normal comprehension.   Cognition:    The patient is oriented to person, place, and time;     recent and remote memory intact;     language fluent;    Cranial Nerves:    The pupils are equal, round, and reactive to light.Trigeminal sensation is intact and the muscles of mastication are normal. The face is symmetric. The palate elevates in the midline. Hearing intact. Voice is normal. Shoulder shrug is normal. The tongue has normal motion without fasciculations.   Coordination:  No dysmetria  Motor Observation:    No asymmetry, no atrophy, and no involuntary movements noted. Tone:    Normal muscle tone.     Strength: mild left proximal weakness(due to pain hip flexion),left leg flexion 4/5      Sensation: intact to LT  DTRs: Absent AJs, 2+ patellars, 1+ uppers       Assessment/Plan:  This is a patient we have been seeing for several years, I last saw her in the office October 2020 however I see her every 3 months for Botox injections for chronic migraines.  She is here today for left lower extremity weakness, falls.  We have also seen her in the past for memory changes sleep study was negative, formal neurocognitive testing was negative for degenerative neurocognitive disease.  She has a past medical history of hypertension, high cholesterol, chronic pain, migraine, depression, anxiety, fibromyalgia, vertigo, syncope, sciatica, bipolar, morbid obesity, severe depressed bipolar 2 disorder.  Left leg weakness and radiating pain likely from left L5/S1 radic, recently had MRI lumbar spine 11/27/2021:  " Lumbar spondylosis, as outlined and otherwise not significantly from the prior MRI of 02/18/2020. No more than mild spinal canal narrowing at the remaining levels. Additional sites of foraminal stenosis, as detailed and greatest on the left at L5-S1 (mild-to-moderate at this site)."   In addition to above MRI, She also  has numbness on left in L5 distribution and left leg flexion weakness c/w radiculopathy.  - she  already go to [facility] and had lumbar injections, helped some, she in on pain medications. Continue to follow up there  - would consider surgery, discuss with [facility]. They have already sent to orthopaedics Dr. [facility] for surgical evaluation and suggested water therapy and she went to water for 6 months, I would go back to this physician for evaluation again of surgery may also consider [facility] neurosurgery if Dr. Blanchie Dessert doesn't think this is surgical.   - unfortunately not much more that neurology can provide, she needs a surgeon, can recommend if Dr. [place] still won't consider surgery would ask Blanchie Dessert to refer to [facility] neurosurgery  - also recommend Zoe Lan jones check hip for hip disease which may also be a problem.   Send message with this note to [facility] NP: [name]: I saw our mutual patient today, I think she has L5-S1 radiculopathy.  Unfortunately there is not  much more that neurology can do for her, she has been to [facility] and gotten multiple injections, she has had physical therapy, she has been to an orthopedic surgeon as well who referred her for the PT.  I think at this time she needs to go back to the orthopedic surgeon since she is not improving and hopefully he/she can recommend some surgical intervention. If he/she does not, you can refer patient to [facility] for evaluation of surgical intervention. I would also check patient's hips to make sure no etiology there. Thanks, Dr. Jaynee Eagles Vivien Rota)    Cc Eldridge Abrahams  I spent over 40 minutes of face-to-face and non-face-to-face time with patient on the  1. Lumbosacral radiculopathy at L5   2. Lumbosacral radiculopathy at S1    diagnosis.  This included previsit chart review, lab review, study review, order entry, electronic health record documentation, patient education on the different diagnostic and therapeutic options, counseling and coordination of care, risks and  benefits of management, compliance, or risk factor reduction    Sarina Ill, MD  [facility] [address] [place], [place] [address]  Phone [phone] Fax [phone]

## 2022-01-28 NOTE — Patient Instructions (Addendum)
Left leg weakness and radiating pain likely from left L5/S1 radic, recently had MRI lumbar spine " Lumbar spondylosis, as outlined and otherwise not significantly from the prior MRI of 02/18/2020. No more than mild spinal canal narrowing at the remaining levels. Additional sites of foraminal stenosis, as detailed and greatest on the left at L5-S1 (mild-to-moderate at this site)." Also has weakness and numbness in L5 distribution on left.  - she already go to [facility] and had lumbar injections, helped some, she in on pain medications. Continue to follow up there  - would consider surgery, discuss with [facility]. They have already sent to orthopaedics Dr. Blanchie Dessert for surgical evaluation and suggested water therapy and she went to water for 6 months, I would go back to this physician for evaluation again of surgery may also consider [place] neurosurgery if Dr. Blanchie Dessert doesn't think this is surgical.  Make sure to get a CD with images on it and carry with you.  - infortunately not muchmore that neurology can provide, she needs a surgeon, can recommend if Dr. Blanchie Dessert still won't consider surgery would ask Zoe Lan to refer to [facility] neurosurgery.   - also recommend [name] jones check hip for hip disease which may also be a problem

## 2022-02-11 ENCOUNTER — Ambulatory Visit (HOSPITAL_COMMUNITY): Payer: Medicare Other | Admitting: Licensed Clinical Social Worker

## 2022-02-16 ENCOUNTER — Other Ambulatory Visit (HOSPITAL_COMMUNITY): Payer: Self-pay | Admitting: Psychiatry

## 2022-02-16 DIAGNOSIS — G4700 Insomnia, unspecified: Secondary | ICD-10-CM

## 2022-02-24 ENCOUNTER — Ambulatory Visit (HOSPITAL_COMMUNITY): Payer: Medicare Other | Admitting: Licensed Clinical Social Worker

## 2022-03-18 ENCOUNTER — Encounter (HOSPITAL_COMMUNITY): Payer: Self-pay | Admitting: Psychiatry

## 2022-03-18 ENCOUNTER — Telehealth (HOSPITAL_BASED_OUTPATIENT_CLINIC_OR_DEPARTMENT_OTHER): Payer: Medicare Other | Admitting: Psychiatry

## 2022-03-18 DIAGNOSIS — F3181 Bipolar II disorder: Secondary | ICD-10-CM

## 2022-03-18 DIAGNOSIS — G4700 Insomnia, unspecified: Secondary | ICD-10-CM | POA: Diagnosis not present

## 2022-03-18 DIAGNOSIS — F411 Generalized anxiety disorder: Secondary | ICD-10-CM

## 2022-03-18 DIAGNOSIS — F4001 Agoraphobia with panic disorder: Secondary | ICD-10-CM | POA: Diagnosis not present

## 2022-03-18 MED ORDER — DOXEPIN HCL 150 MG PO CAPS: 150.0000 mg | ORAL_CAPSULE | Freq: Every day | ORAL | 0 refills | Status: DC

## 2022-03-18 MED ORDER — DIVALPROEX SODIUM ER 500 MG PO TB24: 1000.0000 mg | ORAL_TABLET | Freq: Every day | ORAL | 0 refills | Status: DC

## 2022-03-18 NOTE — Progress Notes (Signed)
Virtual Visit via Video Note  I connected with [name] on 03/18/22 at  1:00 PM EDT by   a video enabled telemedicine application and verified that I am speaking with the correct person using two identifiers.  Location: Patient: home Provider: office   I discussed the limitations of evaluation and management by telemedicine and the availability of in person appointments. The patient expressed understanding and agreed to proceed.  History of Present Illness: Eily she is sleeping better. She is able to fall asleep but wakes up at least 2 times during the night. She is able to fall back asleep soon. She is getting about 5-6 hrs/night. Her energy is very low. She stopped doing water aerobics due to the drive and fatigue. Her motivation to do chores is also poor.  Her appetite is fair. She doesn't get hungry until 2-3 pm but she is eating 2 meals/day. She has lost about 20 lbs of weight in 3 months. Her PCP told her it was muscle loss rather than fat. Her anxiety is worse. She is always on edge and irritable. It is a daily problem. Elvie is worried about her finances, car, her ex and dealing with her son are the main problems. She is having stressed induced panic attacks about 2x/week. Her depression is ongoing and unchanged. She is unmotivated and depressed 3-4 days/week. She denies SI/HI. She is no longer having VH. She denies AH. Pt denies recent manic and hypomanic symptoms including periods of decreased need for sleep, increased energy, mood lability, impulsivity and  FOI. She is spending an extra $100-150/month. Her health problems are ongoing.    Observations/Objective: Psychiatric Specialty Exam: ROS  There were no vitals taken for this visit.There is no height or weight on file to calculate BMI.  General Appearance: Casual and Fairly Groomed  Eye Contact:  Fair  Speech:  Clear and Coherent and Slow  Volume:  Normal  Mood:  Anxious and Depressed  Affect:  Congruent  Thought  Process:  Coherent and Descriptions of Associations: Intact  Orientation:  Full (Time, Place, and Person)  Thought Content:  Logical  Suicidal Thoughts:  No  Homicidal Thoughts:  No  Memory:  Immediate;   Good  Judgement:  Fair  Insight:  Fair  Psychomotor Activity:  Normal  Concentration:  Concentration: Fair  Recall:  Good  Fund of Knowledge:  Good  Language:  Good  Akathisia:  No  Handed:  Right  AIMS (if indicated):     Assets:  Communication Skills Desire for Improvement Financial Resources/Insurance Housing Leisure Time Talents/Skills Transportation Vocational/Educational  ADL's:  Intact  Cognition:  WNL  Sleep:        Assessment and Plan:     03/18/2022    1:19 PM 12/31/2021    1:41 PM 09/17/2021   11:37 AM 07/16/2021   11:33 AM 05/07/2021    9:34 AM  Depression screen PHQ 2/9  Decreased Interest 3 2 0 1 2  Down, Depressed, Hopeless 2 2 1 1 1   PHQ - 2 Score 5 4 1 2 3   Altered sleeping 3 3  3  0  Tired, decreased energy 3 3  0 3  Change in appetite 3 1  0 3  Feeling bad or failure about yourself  2 2  1 2   Trouble concentrating 2 2  3 3   Moving slowly or fidgety/restless 0 2  0 0  Suicidal thoughts 0 0  0 0  PHQ-9 Score 18 17  9  14  Difficult doing work/chores Very difficult Extremely dIfficult  Very difficult Very difficult    Flowsheet Row Video Visit from 03/18/2022 in [facility] from 01/24/2022 in [facility] EMERGENCY DEPARTMENT Video Visit from 12/31/2021 in [facility]  C-SSRS RISK CATEGORY No Risk No Risk No Risk        Pt is aware that these meds carry a teratogenic risk. Pt will discuss plan of action if she does or plans to become pregnant in the future.  Status of current problems: ongoing depression and anxiety,  improved sleep  Meds: Increase Doxepin to 150mg  qHS for anxiety and depression 1. Bipolar II disorder (HCC) - divalproex  (DEPAKOTE ER) 500 MG 24 hr tablet; Take 2 tablets (1,000 mg total) by mouth daily.  Dispense: 180 tablet; Refill: 0 - doxepin (SINEQUAN) 150 MG capsule; Take 1 capsule (150 mg total) by mouth at bedtime.  Dispense: 90 capsule; Refill: 0  2. Panic disorder with agoraphobia - doxepin (SINEQUAN) 150 MG capsule; Take 1 capsule (150 mg total) by mouth at bedtime.  Dispense: 90 capsule; Refill: 0  3. GAD (generalized anxiety disorder) - doxepin (SINEQUAN) 150 MG capsule; Take 1 capsule (150 mg total) by mouth at bedtime.  Dispense: 90 capsule; Refill: 0  4. Insomnia, unspecified type - doxepin (SINEQUAN) 150 MG capsule; Take 1 capsule (150 mg total) by mouth at bedtime.  Dispense: 90 capsule; Refill: 0     Labs: reviewed labs done 01/24/22 CMP otherwise normal except BUN of 69, CBC WNL, Valproic acid level ordered but was not drawn   Therapy: brief supportive therapy provided. Discussed psychosocial stressors in detail.     Collaboration of Care: Other none  Patient/Guardian was advised Release of Information must be obtained prior to any record release in order to collaborate their care with an outside provider. Patient/Guardian was advised if they have not already done so to contact the registration department to sign all necessary forms in order for 03/26/22 to release information regarding their care.   Consent: Patient/Guardian gives verbal consent for treatment and assignment of benefits for services provided during this visit. Patient/Guardian expressed understanding and agreed to proceed.     Follow Up Instructions: Follow up in 2 months or sooner if needed    I discussed the assessment and treatment plan with the patient. The patient was provided an opportunity to ask questions and all were answered. The patient agreed with the plan and demonstrated an understanding of the instructions.   The patient was advised to call back or seek an in-person evaluation if the symptoms worsen or if  the condition fails to improve as anticipated.  I provided 18 minutes of non-face-to-face time during this encounter.   [name], MD

## 2022-04-09 ENCOUNTER — Ambulatory Visit (HOSPITAL_COMMUNITY)
Admission: EM | Admit: 2022-04-09 | Discharge: 2022-04-09 | Disposition: A | Discharge status: Home / Self Care | Payer: Medicare Other | Attending: Family Medicine | Admitting: Family Medicine

## 2022-04-09 ENCOUNTER — Encounter (HOSPITAL_COMMUNITY): Payer: Self-pay

## 2022-04-09 ENCOUNTER — Ambulatory Visit (HOSPITAL_COMMUNITY): Payer: Medicare Other

## 2022-04-09 ENCOUNTER — Ambulatory Visit (INDEPENDENT_AMBULATORY_CARE_PROVIDER_SITE_OTHER): Payer: Medicare Other

## 2022-04-09 DIAGNOSIS — S50311A Abrasion of right elbow, initial encounter: Secondary | ICD-10-CM | POA: Diagnosis not present

## 2022-04-09 DIAGNOSIS — M79602 Pain in left arm: Secondary | ICD-10-CM

## 2022-04-09 DIAGNOSIS — M533 Sacrococcygeal disorders, not elsewhere classified: Secondary | ICD-10-CM | POA: Diagnosis not present

## 2022-04-09 DIAGNOSIS — M25521 Pain in right elbow: Secondary | ICD-10-CM | POA: Diagnosis not present

## 2022-04-09 DIAGNOSIS — M79622 Pain in left upper arm: Secondary | ICD-10-CM | POA: Diagnosis not present

## 2022-04-09 DIAGNOSIS — M7918 Myalgia, other site: Secondary | ICD-10-CM

## 2022-04-09 MED ORDER — KETOROLAC TROMETHAMINE 30 MG/ML IJ SOLN
INTRAMUSCULAR | Status: AC
  Filled 2022-04-09: qty 1

## 2022-04-09 MED ORDER — IBUPROFEN 800 MG PO TABS: 800.0000 mg | ORAL_TABLET | Freq: Three times a day (TID) | ORAL | 0 refills | Status: DC | PRN

## 2022-04-09 MED ORDER — KETOROLAC TROMETHAMINE 30 MG/ML IJ SOLN
30.0000 mg | Freq: Once | INTRAMUSCULAR | Status: AC
Start: 2022-04-09 — End: 2022-04-09
  Administered 2022-04-09: 30 mg via INTRAMUSCULAR

## 2022-04-09 NOTE — ED Triage Notes (Signed)
Pt is here due to fainting last night and injure the left sid of the body on 04/08/2022

## 2022-04-09 NOTE — Discharge Instructions (Addendum)
X-rays do not show any broken bones.  You have been given a shot of Toradol 30 mg today.  Take ibuprofen 800 mg--1 tab every 8 hours as needed for pain.

## 2022-04-09 NOTE — ED Provider Notes (Signed)
[name]    CSN: [id number] Arrival date & time: 04/09/22  1609      History   Chief Complaint Chief Complaint  Patient presents with   Loss of Consciousness    HPI [name] is a 56 y.o. female.    Loss of Consciousness  For pain in several areas after she had a syncopal episode yesterday evening.   She states she has a history of syncope and had just gotten up to go get her some hot tea and passed out on the kitchen floor.  She is uncertain how long she was out, but she does note nails some left upper arm pain, right elbow pain, and some pain of both buttocks.  She does not have any headache or neck pain.   No seizure activity; no b/b incontinence  Past Medical History:  Diagnosis Date   Acute kidney injury ([place]) 10/25/2020   Allergic rhinitis    Allergic to cats    pet dander   Allergy    Anemia    Anxiety    Arthritis 09/2018   both feet   Asthma    Back pain    Bipolar disorder ([place])    Cervicalgia    Chronic fatigue syndrome    Chronic low back pain with left-sided sciatica    Constipation    Depression    Diabetes mellitus without complication ([place])    "pre"   Dyspnea    Environmental allergies    Fibromyalgia    GERD (gastroesophageal reflux disease)    Grave's disease    Heart murmur    High cholesterol    History of blood clots    Hypertension    Joint pain    Lactose intolerance    Lower extremity edema    Multiple food allergies    OSA (obstructive sleep apnea)    Osteoporosis    Palpitations    Panic disorder    Rheumatoid arthritis ([place])    Risk for falls    Sciatica    Severe 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 ([place])    Sleep apnea    no cpap worn   Syncope    Syncope and collapse    Thyroid disease    Vasovagal syncope    Vertigo    Vitamin D deficiency     Patient Active Problem List   Diagnosis Date Noted   Lumbosacral radiculopathy at L5 01/28/2022   Lumbosacral  radiculopathy at S1 01/28/2022   Multiple neurological symptoms 06/23/2021   Protein-calorie malnutrition, mild ([place]) 06/23/2021   Acute respiratory disease due to COVID-19 virus 10/25/2020   AKI (acute kidney injury) ([place]) 99991111   Metabolic syndrome 99991111   Constipation 07/09/2020   Subclinical hypothyroidism 05/11/2019   Prediabetes 05/11/2019   Vitamin D deficiency 05/11/2019   OSA (obstructive sleep apnea) 05/11/2019   Intractable chronic migraine without aura and with status migrainosus 01/14/2016   Risk for falls 09/02/2015   Insomnia 05/27/2015   Severe depressed bipolar II disorder without psychotic features ([place]) 02/25/2015   Class 3 severe obesity with serious comorbidity and body mass index (BMI) of 40.0 to 44.9 in adult ([place]) 11/14/2014   Severe 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 ([place]) 10/10/2014   GAD (generalized anxiety disorder) 10/10/2014   Panic disorder with agoraphobia 10/10/2014   Social anxiety disorder 10/10/2014   Jaw pain-acute TMJ 09/24/14 09/24/2014   Asthma 08/01/2014   Low back pain 07/24/2014   Encounter for  monitoring opioid maintenance therapy 07/02/2014   Pharyngoesophageal dysphagia 06/24/2014   S/P gastric bypass 06/24/2014   Protein-calorie malnutrition, severe ([place]) 11/03/2012   Drug overdose, multiple drugs 11/01/2012   Hypokalemia 11/01/2012   Migraine 05/22/2012   Depression 11/22/2011   Chronic pain 11/22/2011   Graves' disease    GERD (gastroesophageal reflux disease)    Need for prophylactic postmenopausal hormone replacement therapy 11/03/2011   Allergic rhinitis 09/28/2011   Anemia 09/28/2011   Essential hypertension 09/28/2011    Past Surgical History:  Procedure Laterality Date   ABDOMINAL HYSTERECTOMY  2006   COLONOSCOPY     GASTRIC BYPASS  2008    OB History     Gravida  2   Para  2   Term      Preterm      AB      Living  2      SAB      IAB      Ectopic       Multiple      Live Births               Home Medications    Prior to Admission medications   Medication Sig Start Date End Date Taking? Authorizing Provider  ibuprofen (ADVIL) 800 MG tablet Take 1 tablet (800 mg total) by mouth every 8 (eight) hours as needed (pain). 04/09/22  Yes Barrett Henle, MD  albuterol (VENTOLIN HFA) 108 (90 Base) MCG/ACT inhaler Inhale 2 puffs into the lungs every 6 (six) hours as needed for wheezing. 12/20/19   Whitmire, Joneen Boers, FNP  azelastine (ASTELIN) 0.1 % nasal spray Place 2 sprays into both nostrils in the morning. Use in each nostril as directed    [provider]  bisacodyl (DULCOLAX) 10 MG suppository Place 1 suppository (10 mg total) rectally as needed for moderate constipation. 01/25/22   Maudie Flakes, MD  budesonide-formoterol (SYMBICORT) 80-4.5 MCG/ACT inhaler Inhale 2 puffs into the lungs 2 (two) times daily as needed (wheezing/sob).     [provider]  cetirizine (ZYRTEC) 10 MG tablet Take 10 mg by mouth daily. 06/16/21   [provider]  cyclobenzaprine (FLEXERIL) 10 MG tablet Take 10 mg by mouth 3 (three) times daily as needed for muscle spasms. 06/16/21   [provider]  diclofenac sodium (VOLTAREN) 1 % GEL Apply 2 g topically 4 (four) times daily. 12/12/18   Trula Slade, DPM  divalproex (DEPAKOTE ER) 500 MG 24 hr tablet Take 2 tablets (1,000 mg total) by mouth daily. 03/18/22   Charlcie Cradle, MD  doxepin (SINEQUAN) 150 MG capsule Take 1 capsule (150 mg total) by mouth at bedtime. 03/18/22   Charlcie Cradle, MD  Dulaglutide (TRULICITY) 3 0000000 SOPN Inject 3 mg into the skin every 30 (thirty) days.    [provider]  estradiol (ESTRACE) 0.5 MG tablet Take 0.5 mg by mouth daily. 07/20/19   [provider]  fluticasone (FLONASE) 50 MCG/ACT nasal spray PLACE 1 SPRAY INTO BOTH NOSTRILS AS NEEDED (ALLERGIES/RUNNY NOSE). 10/22/20   Whitmire, Dawn W, FNP  Galcanezumab-gnlm (EMGALITY) 120  MG/ML SOAJ INJECT 120 MG INTO THE SKIN EVERY 30 DAYS 04/21/21   Melvenia Beam, MD  hydrochlorothiazide (MICROZIDE) 12.5 MG capsule TAKE 1 CAPSULE (12.5 MG TOTAL) BY MOUTH DAILY AS NEEDED (EDEMA). 04/20/21   Georgia Lopes, DO  linaclotide Rolan Lipa) 290 MCG CAPS capsule Take 1 capsule (290 mcg total) by mouth daily before breakfast. 07/15/21   Briscoe Deutscher,  DO  meloxicam (MOBIC) 15 MG tablet Take 15 mg by mouth daily. 06/17/20   [provider]  MOVANTIK 25 MG TABS tablet Take 25 mg by mouth daily. 07/27/19   [provider]  Multiple Vitamin (MULTIVITAMIN) tablet Take 1 tablet by mouth daily.    [provider]  phentermine 15 MG capsule Take 1 capsule (15 mg total) by mouth every morning. 08/12/21   Briscoe Deutscher, DO  polyethylene glycol (MIRALAX) 17 g packet Take 17 g by mouth daily. 01/25/22   Maudie Flakes, MD  tapentadol HCl (NUCYNTA) 75 MG tablet Take 75 mg by mouth in the morning, at noon, and at bedtime.    [provider]  Vitamin D, Ergocalciferol, (DRISDOL) 1.25 MG (50000 UNIT) CAPS capsule Take 1 capsule (50,000 Units total) by mouth every 7 (seven) days. 04/07/21   Georgia Lopes, DO    Family History Family History  Problem Relation Age of Onset   Hypertension Mother    Cirrhosis Mother    Alcohol abuse Mother    Depression Mother    Physical abuse Mother    Hyperlipidemia Mother    Thyroid disease Mother    Anxiety disorder Mother    Arthritis Mother    Hypertension Father    Alcohol abuse Father    Hyperlipidemia Father    Depression Father    Drug abuse Father    Arthritis Father    COPD Father    Gout Father    Hypertension Sister    Alcohol abuse Sister    Drug abuse Sister    Depression Sister    Anxiety disorder Sister    OCD Sister    Thyroid disease Sister    Depression Sister    Thyroid disease Sister    Stroke Maternal Grandmother    Heart attack Maternal Grandmother    Diabetes Maternal Uncle    Stroke Maternal  Uncle    Colon cancer Maternal Uncle    Seizures Cousin    Breast cancer Cousin    Breast cancer Cousin    ADD / ADHD Other    Diabetes Other    Hypertension Other    Dementia Neg Hx    Esophageal cancer Neg Hx    Rectal cancer Neg Hx    Stomach cancer Neg Hx     Social History Social History   Tobacco Use   Smoking status: Never   Smokeless tobacco: Never  Vaping Use   Vaping Use: Never used  Substance Use Topics   Alcohol use: Not Currently    Comment: 1-2 drinks/month   Drug use: No     Allergies   Bee venom, Coconut fatty acids, Mushroom ext cmplx(shiitake-reishi-mait), Nutritional supplements, Other, Shellfish allergy, Strawberry extract, Hydrocodone-acetaminophen, Latex, Molds & smuts, and Hydrocodone-acetaminophen   Review of Systems Review of Systems  Cardiovascular:  Positive for syncope.     Physical Exam Triage Vital Signs ED Triage Vitals  Enc Vitals Group     BP 04/09/22 1735 130/73     Pulse Rate 04/09/22 1735 94     Resp 04/09/22 1735 12     Temp 04/09/22 1735 98.5 F (36.9 C)     Temp Source 04/09/22 1735 Oral     SpO2 04/09/22 1735 96 %     Weight --      Height --      Head Circumference --      Peak Flow --      Pain Score 04/09/22  1719 7     Pain Loc --      Pain Edu? --      Excl. in GC? --    No data found.  Updated Vital Signs BP 130/73 (BP Location: Left Wrist)   Pulse 94   Temp 98.5 F (36.9 C) (Oral)   Resp 12   SpO2 96%   Visual Acuity Right Eye Distance:   Left Eye Distance:   Bilateral Distance:    Right Eye Near:   Left Eye Near:    Bilateral Near:     Physical Exam Vitals reviewed.  Constitutional:      General: She is not in acute distress.    Appearance: She is not ill-appearing, toxic-appearing or diaphoretic.  HENT:     Mouth/Throat:     Mouth: Mucous membranes are moist.  Eyes:     Extraocular Movements: Extraocular movements intact.     Conjunctiva/sclera: Conjunctivae normal.     Pupils:  Pupils are equal, round, and reactive to light.  Cardiovascular:     Rate and Rhythm: Normal rate and regular rhythm.     Heart sounds: No murmur heard. Pulmonary:     Effort: Pulmonary effort is normal.     Breath sounds: Normal breath sounds.  Musculoskeletal:     Cervical back: Neck supple.     Comments: There is bruising of the lateral upper arm and of the right olecranon process.  She also has a shallow linear abrasion about 5 cm in length.  It is not bleeding  Lymphadenopathy:     Cervical: No cervical adenopathy.  Skin:    Coloration: Skin is not jaundiced or pale.  Neurological:     General: No focal deficit present.     Mental Status: She is alert and oriented to person, place, and time.  Psychiatric:        Behavior: Behavior normal.      UC Treatments / Results  Labs (all labs ordered are listed, but only abnormal results are displayed) Labs Reviewed - No data to display  EKG   Radiology DG Pelvis 1-2 Views  Result Date: 04/09/2022 CLINICAL DATA:  Buttock pain EXAM: PELVIS - 1-2 VIEW COMPARISON:  None Available. FINDINGS: There is no evidence of pelvic fracture or diastasis. No pelvic bone lesions are seen. IMPRESSION: Negative. Electronically Signed   By: Jasmine Pang M.D.   On: 04/09/2022 18:18   DG Elbow Complete Right  Result Date: 04/09/2022 CLINICAL DATA:  Elbow pain EXAM: RIGHT ELBOW - COMPLETE 3+ VIEW COMPARISON:  None Available. FINDINGS: There is no evidence of fracture, dislocation, or joint effusion. There is no evidence of arthropathy or other focal bone abnormality. Soft tissues are unremarkable. IMPRESSION: Negative. Electronically Signed   By: Jasmine Pang M.D.   On: 04/09/2022 18:17   DG Humerus Left  Result Date: 04/09/2022 CLINICAL DATA:  Arm pain injury to left side of body EXAM: LEFT HUMERUS - 2+ VIEW COMPARISON:  None Available. FINDINGS: There is no evidence of fracture or other focal bone lesions. Soft tissues are unremarkable.  IMPRESSION: Negative. Electronically Signed   By: Jasmine Pang M.D.   On: 04/09/2022 18:17    Procedures Procedures (including critical care time)  Medications Ordered in UC Medications  ketorolac (TORADOL) 30 MG/ML injection 30 mg (has no administration in time range)    Initial Impression / Assessment and Plan / UC Course  I have reviewed the triage vital signs and the nursing notes.  Pertinent labs &  imaging results that were available during my care of the patient were reviewed by me and considered in my medical decision making (see chart for details).        No fractures or acute bony pathology on any of her x-rays.  She already takes Nucynta for pain.  She is going to have a shot of Toradol here and I am sending in ibuprofen to take for a few days.  Ice and elevation Final Clinical Impressions(s) / UC Diagnoses   Final diagnoses:  Left arm pain  Buttock pain  Right elbow pain  Abrasion of right elbow, initial encounter     Discharge Instructions      X-rays do not show any broken bones.  You have been given a shot of Toradol 30 mg today.  Take ibuprofen 800 mg--1 tab every 8 hours as needed for pain.       ED Prescriptions     Medication Sig Dispense Auth. Provider   ibuprofen (ADVIL) 800 MG tablet Take 1 tablet (800 mg total) by mouth every 8 (eight) hours as needed (pain). 21 tablet Ernst Cumpston, Gwenlyn Perking, MD      I have reviewed the PDMP during this encounter.   Barrett Henle, MD 04/09/22 [phone]

## 2022-04-14 ENCOUNTER — Encounter: Payer: Self-pay | Admitting: *Deleted

## 2022-04-19 ENCOUNTER — Ambulatory Visit (INDEPENDENT_AMBULATORY_CARE_PROVIDER_SITE_OTHER): Payer: Medicare Other | Admitting: Neurology

## 2022-04-19 DIAGNOSIS — G43711 Chronic migraine without aura, intractable, with status migrainosus: Secondary | ICD-10-CM | POA: Diagnosis not present

## 2022-04-19 MED ORDER — ONABOTULINUMTOXINA 200 UNITS IJ SOLR
155.0000 [IU] | Freq: Once | INTRAMUSCULAR | Status: AC
  Administered 2022-04-19: 155 [IU] via INTRAMUSCULAR

## 2022-04-19 NOTE — Progress Notes (Signed)
Consent Form Botulism Toxin Injection For Chronic Migraine  04/19/2022: On botox only gets 4 migraines a month. IS VERY ANXIOUS TAKES XANAX before coming in be very gentle with injections  01/19/2022: Super anxious, but doing very well with botox, > 50% improvement in migraine freq and severity. Gave xanax to use prior to injections, discussed risks of taking xanax with nucynta including resp failure and death, discussed take one tablet 30-60 minutes before botox procedure, do not take at any other time, she undertood and agreed. will discuss disposing of any unused medication at next appointment  No orders of the defined types were placed in this encounter.   10/27/2021: stable doing well. She lost 30 pounds, her hair looks great, she had an episode of dizziness and weakness wasn;t eating or drinking after a breakup, she has had some falls due to right leg weakness pending mri lumbar spine we wll see her on the 22nd.   07/21/2021: She gets > 70% relief migraine frequency, extremely anxious, do the botox fast, +a  04/21/2021: stable, still doing same if not better > 60% improvement. She broke up with her boyfriend. Patient states she did not take the xanax, she is here alone, I warned her not to drive if she takes xanax or clonazepam and have someone drive her.   12/16/3662: getting back on track. Will give some xanax for next time she is incredibly stressed when she comes in, she is lovely, here with fiance  Interval history 04/28/2020: Doing great, More than 60% improvement in frequency but also in severity. Ajovy not approve using emgality instead. she has failed imitrex and maxalt and cannot take anymore triptans, contraindicated due to side effects, gave her some samples. She feels clenching is a problem and she has pain when clenching in the temples as well and we give her muscle relaxers since we cannot put botox there. She had HTN on aimovig, stopped. She had botox in the past and we restarted  on 03/2019, doing great. Loves emgality as an add on as well. No date set for her wedding yet, it has been 2.5 years. She is very anxious and took anxiety medication today and did better but she is very anxious. She wants an outdoor wedding. She did much better, she took her anxiety and pain meds. [facility]    Reviewed orally with patient, additionally signature is on file:  Botulism toxin has been approved by the Federal drug administration for treatment of chronic migraine. Botulism toxin does not cure chronic migraine and it may not be effective in some patients.  The administration of botulism toxin is accomplished by injecting a small amount of toxin into the muscles of the neck and head. Dosage must be titrated for each individual. Any benefits resulting from botulism toxin tend to wear off after 3 months with a repeat injection required if benefit is to be maintained. Injections are usually done every 3-4 months with maximum effect peak achieved by about 2 or 3 weeks. Botulism toxin is expensive and you should be sure of what costs you will incur resulting from the injection.  The side effects of botulism toxin use for chronic migraine may include:   -Transient, and usually mild, facial weakness with facial injections  -Transient, and usually mild, head or neck weakness with head/neck injections  -Reduction or loss of forehead facial animation due to forehead muscle weakness  -Eyelid drooping  -Dry eye  -Pain at  the site of injection or bruising at the site of injection  -Double vision  -Potential unknown long term risks  Contraindications: You should not have Botox if you are pregnant, nursing, allergic to albumin, have an infection, skin condition, or muscle weakness at the site of the injection, or have myasthenia gravis, Lambert-Eaton syndrome, or ALS.  It is also possible that as with any injection, there may be an allergic  reaction or no effect from the medication. Reduced effectiveness after repeated injections is sometimes seen and rarely infection at the injection site may occur. All care will be taken to prevent these side effects. If therapy is given over a long time, atrophy and wasting in the muscle injected may occur. Occasionally the patient's become refractory to treatment because they develop antibodies to the toxin. In this event, therapy needs to be modified.  I have read the above information and consent to the administration of botulism toxin.    BOTOX PROCEDURE NOTE FOR MIGRAINE HEADACHE    Contraindications and precautions discussed with patient(above). Aseptic procedure was observed and patient tolerated procedure. Procedure performed by Dr. Artemio Aly  The condition has existed for more than 6 months, and pt does not have a diagnosis of ALS, Myasthenia Gravis or Lambert-Eaton Syndrome.  Risks and benefits of injections discussed and pt agrees to proceed with the procedure.  Written consent obtained  These injections are medically necessary. Pt  receives good benefits from these injections. These injections do not cause sedations or hallucinations which the oral therapies may cause.  Description of procedure:  The patient was placed in a sitting position. The standard protocol was used for Botox as follows, with 5 units of Botox injected at each site:   -Procerus muscle, midline injection  -Corrugator muscle, bilateral injection  -Frontalis muscle, bilateral injection, with 2 sites each side, medial injection was performed in the upper one third of the frontalis muscle, in the region vertical from the medial inferior edge of the superior orbital rim. The lateral injection was again in the upper one third of the forehead vertically above the lateral limbus of the cornea, 1.5 cm lateral to the medial injection site.  -Temporalis muscle injection, 4 sites, bilaterally. The first injection was  3 cm above the tragus of the ear, second injection site was 1.5 cm to 3 cm up from the first injection site in line with the tragus of the ear. The third injection site was 1.5-3 cm forward between the first 2 injection sites. The fourth injection site was 1.5 cm posterior to the second injection site.   -Occipitalis muscle injection, 3 sites, bilaterally. The first injection was done one half way between the occipital protuberance and the tip of the mastoid process behind the ear. The second injection site was done lateral and superior to the first, 1 fingerbreadth from the first injection. The third injection site was 1 fingerbreadth superiorly and medially from the first injection site.  -Cervical paraspinal muscle injection, 2 sites, bilateral knee first injection site was 1 cm from the midline of the cervical spine, 3 cm inferior to the lower border of the occipital protuberance. The second injection site was 1.5 cm superiorly and laterally to the first injection site.  -Trapezius muscle injection was performed at 3 sites, bilaterally. The first injection site was in the upper trapezius muscle halfway between the inflection point of the neck, and the acromion. The second injection site was one half way between the acromion and the first injection site.  The third injection was done between the first injection site and the inflection point of the neck.   Will return for repeat injection in 3 months.   155 units of Botox was used, 45u Botox not injected was wasted. The patient tolerated the procedure well, there were no complications of the above procedure.

## 2022-04-19 NOTE — Progress Notes (Signed)
Botox- 200 units x 1 vial Lot: [id number] Expiration: 08/2024 NDC: 0023-3921-02  Bacteriostatic 0.9% Sodium Chloride- 4mL total Lot: [id number] Expiration: 01/23/2023 NDC: 0409-1966-02  Dx: G43.711 B/B  

## 2022-05-20 ENCOUNTER — Telehealth (HOSPITAL_BASED_OUTPATIENT_CLINIC_OR_DEPARTMENT_OTHER): Payer: Medicare Other | Admitting: Psychiatry

## 2022-05-20 DIAGNOSIS — F411 Generalized anxiety disorder: Secondary | ICD-10-CM | POA: Diagnosis not present

## 2022-05-20 DIAGNOSIS — G4700 Insomnia, unspecified: Secondary | ICD-10-CM

## 2022-05-20 DIAGNOSIS — F3181 Bipolar II disorder: Secondary | ICD-10-CM

## 2022-05-20 DIAGNOSIS — Z5181 Encounter for therapeutic drug level monitoring: Secondary | ICD-10-CM

## 2022-05-20 DIAGNOSIS — F4001 Agoraphobia with panic disorder: Secondary | ICD-10-CM

## 2022-05-20 MED ORDER — DIVALPROEX SODIUM ER 500 MG PO TB24: 1000.0000 mg | ORAL_TABLET | Freq: Every day | ORAL | 0 refills | Status: DC

## 2022-05-20 MED ORDER — DOXEPIN HCL 150 MG PO CAPS: 150.0000 mg | ORAL_CAPSULE | Freq: Every day | ORAL | 0 refills | Status: DC

## 2022-05-20 NOTE — Progress Notes (Signed)
Virtual Visit via Video Note  I connected with [name] on 05/20/22 at  1:00 PM EST by  a video enabled telemedicine application and verified that I am speaking with the correct person using two identifiers.  Location: Patient: home Provider: office   I discussed the limitations of evaluation and management by telemedicine and the availability of in person appointments. The patient expressed understanding and agreed to proceed.  History of Present Illness: [name] shares she is sleeping well. She is having nightmares 2-3x/week. She sleeps thru them and wakes up in the morning remembering some of them. The dreams scare her and cause anxiety about whether her loved ones are ok. She will usually go back to sleep in hopes of changing the dream to a better outcome. It does not usually work and she ends up calling her loved ones to check on them. [name] is a little more depressed than usual because of the holidays.It began around Thanksgiving. She is depressed 3-4 days/week. She misses her daughter and granddaughter. [name] has been isolating and is more withdrawn. She ends up spending most of her time in bed until someone comes over and forces her out. She states she had 1 day of passive thoughts of death about 5 days ago. She does not want to die but occasionally feels like she is a burden to others. [name] denies SI/HI. Her anxiety is high. She gets irritable. [name] is having stress induced panic attacks about 1x/week.  Pt thinks she was having some hypomanic symptoms but can't remember what or when it was. She may have experienced some VH of a man but can't tell if it was a dream or real VH.     Observations/Objective: Psychiatric Specialty Exam: ROS  There were no vitals taken for this visit.There is no height or weight on file to calculate BMI.  General Appearance: Casual  Eye Contact:  Good  Speech:  Clear and Coherent and Normal Rate  Volume:  Normal  Mood:  Anxious and Depressed   Affect:  Congruent and Tearful  Thought Process:  Goal Directed, Linear, and Descriptions of Associations: Intact  Orientation:  Full (Time, Place, and Person)  Thought Content:  Logical  Suicidal Thoughts:  No  Homicidal Thoughts:  No  Memory:  Immediate;   Good  Judgement:  Good  Insight:  Good  Psychomotor Activity:  Normal  Concentration:  Concentration: Good  Recall:  Good  Fund of Knowledge:  Good  Language:  Good  Akathisia:  No  Handed:  Right  AIMS (if indicated):     Assets:  Communication Skills Desire for Improvement Financial Resources/Insurance Housing Social Support Talents/Skills Transportation Vocational/Educational  ADL's:  Intact  Cognition:  WNL  Sleep:        Assessment and Plan:     05/20/2022    1:14 PM 03/18/2022    1:19 PM 12/31/2021    1:41 PM 09/17/2021   11:37 AM 07/16/2021   11:33 AM  Depression screen PHQ 2/9  Decreased Interest 0 3 2 0 1  Down, Depressed, Hopeless 2 2 2 1 1   PHQ - 2 Score 2 5 4 1 2   Altered sleeping 0 3 3  3   Tired, decreased energy 3 3 3   0  Change in appetite 3 3 1   0  Feeling bad or failure about yourself  3 2 2  1   Trouble concentrating 1 2 2  3   Moving slowly or fidgety/restless 1 0 2  0  Suicidal  thoughts 1 0 0  0  PHQ-9 Score 14 18 17  9   Difficult doing work/chores Very difficult Very difficult Extremely dIfficult  Very difficult    Flowsheet Row Video Visit from 05/20/2022 in [facility] from 04/09/2022 in [facility] Video Visit from 03/18/2022 in [facility]  C-SSRS RISK CATEGORY Low Risk No Risk No Risk          Pt is aware that these meds carry a teratogenic risk. Pt will discuss plan of action if she does or plans to become pregnant in the future.  Status of current problems: ongoing depression and anxiety. [name] does not want her meds changed at this time but is agreeable to  increasing the Depakote if the level is low. She is agreeable to start therapy.    Medication management with supportive therapy. Risks and benefits, side effects and alternative treatment options discussed with patient. Pt was given an opportunity to ask questions about medication, illness, and treatment. All current psychiatric medications have been reviewed and discussed with the patient and adjusted as clinically appropriate.  Pt verbalized understanding and verbal consent obtained for treatment.  Meds:  1. Bipolar II disorder (HCC) - divalproex (DEPAKOTE ER) 500 MG 24 hr tablet; Take 2 tablets (1,000 mg total) by mouth daily.  Dispense: 180 tablet; Refill: 0 - doxepin (SINEQUAN) 150 MG capsule; Take 1 capsule (150 mg total) by mouth at bedtime.  Dispense: 90 capsule; Refill: 0  2. Panic disorder with agoraphobia - doxepin (SINEQUAN) 150 MG capsule; Take 1 capsule (150 mg total) by mouth at bedtime.  Dispense: 90 capsule; Refill: 0  3. GAD (generalized anxiety disorder) - doxepin (SINEQUAN) 150 MG capsule; Take 1 capsule (150 mg total) by mouth at bedtime.  Dispense: 90 capsule; Refill: 0  4. Insomnia, unspecified type - doxepin (SINEQUAN) 150 MG capsule; Take 1 capsule (150 mg total) by mouth at bedtime.  Dispense: 90 capsule; Refill: 0  5. Encounter for therapeutic drug monitoring - Valproic Acid level - CBC with Differential - Comprehensive Metabolic Panel (CMET)        Therapy: brief supportive therapy provided. Discussed psychosocial stressors in detail.      Collaboration of Care: Referral or follow-up with counselor/therapist AEB therapy  Patient/Guardian was advised Release of Information must be obtained prior to any record release in order to collaborate their care with an outside provider. Patient/Guardian was advised if they have not already done so to contact the registration department to sign all necessary forms in order for [name] to release information regarding their  care.   Consent: Patient/Guardian gives verbal consent for treatment and assignment of benefits for services provided during this visit. Patient/Guardian expressed understanding and agreed to proceed.     Follow Up Instructions: Follow up in 3-4 weeks or sooner if needed    I discussed the assessment and treatment plan with the patient. The patient was provided an opportunity to ask questions and all were answered. The patient agreed with the plan and demonstrated an understanding of the instructions.   The patient was advised to call back or seek an in-person evaluation if the symptoms worsen or if the condition fails to improve as anticipated.  I provided 19 minutes of non-face-to-face time during this encounter.   [name], MD

## 2022-05-24 DIAGNOSIS — I2699 Other pulmonary embolism without acute cor pulmonale: Secondary | ICD-10-CM

## 2022-05-24 HISTORY — DX: Other pulmonary embolism without acute cor pulmonale: I26.99

## 2022-05-25 ENCOUNTER — Emergency Department (HOSPITAL_COMMUNITY): Payer: Medicare Other

## 2022-05-25 ENCOUNTER — Encounter (HOSPITAL_COMMUNITY): Payer: Self-pay

## 2022-05-25 ENCOUNTER — Other Ambulatory Visit: Payer: Self-pay

## 2022-05-25 ENCOUNTER — Inpatient Hospital Stay (HOSPITAL_COMMUNITY)
Admission: EM | Admit: 2022-05-25 | Discharge: 2022-05-27 | DRG: 312 | Disposition: A | Discharge status: Home Health | Payer: Medicare Other | Attending: Family Medicine | Admitting: Family Medicine

## 2022-05-25 DIAGNOSIS — Z791 Long term (current) use of non-steroidal anti-inflammatories (NSAID): Secondary | ICD-10-CM

## 2022-05-25 DIAGNOSIS — W19XXXA Unspecified fall, initial encounter: Secondary | ICD-10-CM | POA: Diagnosis present

## 2022-05-25 DIAGNOSIS — R5383 Other fatigue: Secondary | ICD-10-CM | POA: Diagnosis present

## 2022-05-25 DIAGNOSIS — F319 Bipolar disorder, unspecified: Secondary | ICD-10-CM | POA: Diagnosis present

## 2022-05-25 DIAGNOSIS — Z803 Family history of malignant neoplasm of breast: Secondary | ICD-10-CM

## 2022-05-25 DIAGNOSIS — E1142 Type 2 diabetes mellitus with diabetic polyneuropathy: Secondary | ICD-10-CM | POA: Diagnosis present

## 2022-05-25 DIAGNOSIS — G8929 Other chronic pain: Secondary | ICD-10-CM | POA: Diagnosis present

## 2022-05-25 DIAGNOSIS — Z823 Family history of stroke: Secondary | ICD-10-CM

## 2022-05-25 DIAGNOSIS — I951 Orthostatic hypotension: Principal | ICD-10-CM | POA: Diagnosis present

## 2022-05-25 DIAGNOSIS — R55 Syncope and collapse: Secondary | ICD-10-CM | POA: Diagnosis present

## 2022-05-25 DIAGNOSIS — M47816 Spondylosis without myelopathy or radiculopathy, lumbar region: Secondary | ICD-10-CM | POA: Diagnosis present

## 2022-05-25 DIAGNOSIS — M25562 Pain in left knee: Secondary | ICD-10-CM | POA: Diagnosis present

## 2022-05-25 DIAGNOSIS — M069 Rheumatoid arthritis, unspecified: Secondary | ICD-10-CM | POA: Diagnosis present

## 2022-05-25 DIAGNOSIS — E0500 Thyrotoxicosis with diffuse goiter without thyrotoxic crisis or storm: Secondary | ICD-10-CM | POA: Diagnosis present

## 2022-05-25 DIAGNOSIS — Z8261 Family history of arthritis: Secondary | ICD-10-CM

## 2022-05-25 DIAGNOSIS — Z886 Allergy status to analgesic agent status: Secondary | ICD-10-CM

## 2022-05-25 DIAGNOSIS — E11649 Type 2 diabetes mellitus with hypoglycemia without coma: Secondary | ICD-10-CM | POA: Diagnosis not present

## 2022-05-25 DIAGNOSIS — F419 Anxiety disorder, unspecified: Secondary | ICD-10-CM | POA: Diagnosis present

## 2022-05-25 DIAGNOSIS — Z8616 Personal history of COVID-19: Secondary | ICD-10-CM

## 2022-05-25 DIAGNOSIS — I10 Essential (primary) hypertension: Secondary | ICD-10-CM | POA: Diagnosis present

## 2022-05-25 DIAGNOSIS — Z825 Family history of asthma and other chronic lower respiratory diseases: Secondary | ICD-10-CM

## 2022-05-25 DIAGNOSIS — Z811 Family history of alcohol abuse and dependence: Secondary | ICD-10-CM

## 2022-05-25 DIAGNOSIS — E785 Hyperlipidemia, unspecified: Secondary | ICD-10-CM | POA: Diagnosis present

## 2022-05-25 DIAGNOSIS — E669 Obesity, unspecified: Secondary | ICD-10-CM | POA: Diagnosis present

## 2022-05-25 DIAGNOSIS — M47812 Spondylosis without myelopathy or radiculopathy, cervical region: Secondary | ICD-10-CM | POA: Diagnosis present

## 2022-05-25 DIAGNOSIS — G4730 Sleep apnea, unspecified: Secondary | ICD-10-CM | POA: Diagnosis present

## 2022-05-25 DIAGNOSIS — Z91040 Latex allergy status: Secondary | ICD-10-CM

## 2022-05-25 DIAGNOSIS — Z8249 Family history of ischemic heart disease and other diseases of the circulatory system: Secondary | ICD-10-CM

## 2022-05-25 DIAGNOSIS — M25561 Pain in right knee: Secondary | ICD-10-CM | POA: Diagnosis present

## 2022-05-25 DIAGNOSIS — Z79899 Other long term (current) drug therapy: Secondary | ICD-10-CM

## 2022-05-25 DIAGNOSIS — Z83438 Family history of other disorder of lipoprotein metabolism and other lipidemia: Secondary | ICD-10-CM

## 2022-05-25 DIAGNOSIS — Z818 Family history of other mental and behavioral disorders: Secondary | ICD-10-CM

## 2022-05-25 DIAGNOSIS — G43909 Migraine, unspecified, not intractable, without status migrainosus: Secondary | ICD-10-CM | POA: Diagnosis present

## 2022-05-25 DIAGNOSIS — Z7984 Long term (current) use of oral hypoglycemic drugs: Secondary | ICD-10-CM

## 2022-05-25 DIAGNOSIS — K219 Gastro-esophageal reflux disease without esophagitis: Secondary | ICD-10-CM | POA: Diagnosis present

## 2022-05-25 DIAGNOSIS — Z813 Family history of other psychoactive substance abuse and dependence: Secondary | ICD-10-CM

## 2022-05-25 DIAGNOSIS — R339 Retention of urine, unspecified: Secondary | ICD-10-CM | POA: Diagnosis present

## 2022-05-25 DIAGNOSIS — Z800 Family history of malignant neoplasm of digestive organs: Secondary | ICD-10-CM

## 2022-05-25 DIAGNOSIS — R296 Repeated falls: Secondary | ICD-10-CM | POA: Diagnosis present

## 2022-05-25 DIAGNOSIS — Z23 Encounter for immunization: Secondary | ICD-10-CM

## 2022-05-25 DIAGNOSIS — Z9884 Bariatric surgery status: Secondary | ICD-10-CM

## 2022-05-25 DIAGNOSIS — Z833 Family history of diabetes mellitus: Secondary | ICD-10-CM

## 2022-05-25 DIAGNOSIS — Z87898 Personal history of other specified conditions: Secondary | ICD-10-CM

## 2022-05-25 DIAGNOSIS — T443X5A Adverse effect of other parasympatholytics [anticholinergics and antimuscarinics] and spasmolytics, initial encounter: Secondary | ICD-10-CM | POA: Diagnosis present

## 2022-05-25 DIAGNOSIS — Z91013 Allergy to seafood: Secondary | ICD-10-CM

## 2022-05-25 DIAGNOSIS — Z8349 Family history of other endocrine, nutritional and metabolic diseases: Secondary | ICD-10-CM

## 2022-05-25 DIAGNOSIS — M48061 Spinal stenosis, lumbar region without neurogenic claudication: Secondary | ICD-10-CM | POA: Diagnosis present

## 2022-05-25 DIAGNOSIS — Z9109 Other allergy status, other than to drugs and biological substances: Secondary | ICD-10-CM

## 2022-05-25 DIAGNOSIS — Z91030 Bee allergy status: Secondary | ICD-10-CM

## 2022-05-25 DIAGNOSIS — J45909 Unspecified asthma, uncomplicated: Secondary | ICD-10-CM | POA: Diagnosis present

## 2022-05-25 DIAGNOSIS — M1710 Unilateral primary osteoarthritis, unspecified knee: Secondary | ICD-10-CM | POA: Diagnosis present

## 2022-05-25 DIAGNOSIS — D649 Anemia, unspecified: Secondary | ICD-10-CM | POA: Diagnosis present

## 2022-05-25 DIAGNOSIS — E119 Type 2 diabetes mellitus without complications: Secondary | ICD-10-CM

## 2022-05-25 DIAGNOSIS — M79672 Pain in left foot: Secondary | ICD-10-CM | POA: Insufficient documentation

## 2022-05-25 DIAGNOSIS — M810 Age-related osteoporosis without current pathological fracture: Secondary | ICD-10-CM | POA: Diagnosis present

## 2022-05-25 DIAGNOSIS — Z6833 Body mass index (BMI) 33.0-33.9, adult: Secondary | ICD-10-CM

## 2022-05-25 DIAGNOSIS — M5417 Radiculopathy, lumbosacral region: Secondary | ICD-10-CM

## 2022-05-25 LAB — CBC WITH DIFFERENTIAL/PLATELET
Abs Immature Granulocytes: 0.03 10*3/uL (ref 0.00–0.07)
Basophils Absolute: 0 10*3/uL (ref 0.0–0.1)
Basophils Relative: 0 %
Eosinophils Absolute: 0 10*3/uL (ref 0.0–0.5)
Eosinophils Relative: 0 %
HCT: 34.8 % — ABNORMAL LOW (ref 36.0–46.0)
Hemoglobin: 11 g/dL — ABNORMAL LOW (ref 12.0–15.0)
Immature Granulocytes: 0 %
Lymphocytes Relative: 10 %
Lymphs Abs: 0.8 10*3/uL (ref 0.7–4.0)
MCH: 29.1 pg (ref 26.0–34.0)
MCHC: 31.6 g/dL (ref 30.0–36.0)
MCV: 92.1 fL (ref 80.0–100.0)
Monocytes Absolute: 1.8 10*3/uL — ABNORMAL HIGH (ref 0.1–1.0)
Monocytes Relative: 21 %
Neutro Abs: 5.9 10*3/uL (ref 1.7–7.7)
Neutrophils Relative %: 69 %
Platelets: 250 10*3/uL (ref 150–400)
RBC: 3.78 MIL/uL — ABNORMAL LOW (ref 3.87–5.11)
RDW: 14.7 % (ref 11.5–15.5)
WBC: 8.6 10*3/uL (ref 4.0–10.5)
nRBC: 0 % (ref 0.0–0.2)

## 2022-05-25 LAB — RESP PANEL BY RT-PCR (RSV, FLU A&B, COVID)  RVPGX2
Influenza A by PCR: NEGATIVE
Influenza B by PCR: NEGATIVE
Resp Syncytial Virus by PCR: NEGATIVE
SARS Coronavirus 2 by RT PCR: NEGATIVE

## 2022-05-25 LAB — CK: Total CK: 113 U/L (ref 38–234)

## 2022-05-25 LAB — COMPREHENSIVE METABOLIC PANEL
ALT: 11 U/L (ref 0–44)
AST: 27 U/L (ref 15–41)
Albumin: 3.2 g/dL — ABNORMAL LOW (ref 3.5–5.0)
Alkaline Phosphatase: 54 U/L (ref 38–126)
Anion gap: 9 (ref 5–15)
BUN: 22 mg/dL — ABNORMAL HIGH (ref 6–20)
CO2: 24 mmol/L (ref 22–32)
Calcium: 8.3 mg/dL — ABNORMAL LOW (ref 8.9–10.3)
Chloride: 105 mmol/L (ref 98–111)
Creatinine, Ser: 1.04 mg/dL — ABNORMAL HIGH (ref 0.44–1.00)
GFR, Estimated: >60 mL/min (ref >60)
Glucose, Bld: 71 mg/dL (ref 70–99)
Potassium: 3.9 mmol/L (ref 3.5–5.1)
Sodium: 138 mmol/L (ref 135–145)
Total Bilirubin: 0.4 mg/dL (ref 0.3–1.2)
Total Protein: 6.4 g/dL — ABNORMAL LOW (ref 6.5–8.1)

## 2022-05-25 LAB — TROPONIN I (HIGH SENSITIVITY)
Troponin I (High Sensitivity): 4 ng/L (ref <18)
Troponin I (High Sensitivity): 4 ng/L (ref <18)

## 2022-05-25 MED ORDER — SODIUM CHLORIDE 0.9 % IV BOLUS
1000.0000 mL | Freq: Once | INTRAVENOUS | Status: AC
  Administered 2022-05-25: 1000 mL via INTRAVENOUS

## 2022-05-25 MED ORDER — ENOXAPARIN SODIUM 40 MG/0.4ML IJ SOSY
40.0000 mg | PREFILLED_SYRINGE | INTRAMUSCULAR | Status: DC
  Administered 2022-05-27: 40 mg via SUBCUTANEOUS
  Filled 2022-05-25: qty 0.4

## 2022-05-25 MED ORDER — TETANUS-DIPHTH-ACELL PERTUSSIS 5-2.5-18.5 LF-MCG/0.5 IM SUSY
0.5000 mL | PREFILLED_SYRINGE | Freq: Once | INTRAMUSCULAR | Status: AC
  Administered 2022-05-25: 0.5 mL via INTRAMUSCULAR
  Filled 2022-05-25: qty 0.5

## 2022-05-25 NOTE — Consult Note (Shared)
Neurology Consultation  Reason for Consult: Weakness and somnolence after fall Referring Physician: Dr. Doren Custard  CC: ***  History is obtained from:***  HPI: [name] is a 57 y.o. female with history of migraines, fibromyalgia, bipolar disorder, chronic low back pain, diabetes, GERD, sciatica, chronic pain on opiates, hyperlipidemia, hypertension, rheumatoid arthritis as well as recurrent syncope who presents after a fall at 3:00 this morning.  Patient apparently recalls getting out of bed and waking up on the floor.  She was unable to get up without assistance.  She uses a cane for ambulation at baseline.  She had has a long history of migraines and gets Botox injections for these.  She also has seen neurology as well as orthopedic surgery for chronic back pain.  She has had impairment in short and long-term memory, but formal neurocognitive testing was negative for impairment.  Patient has also had recurrent syncopal episodes in the past.    ROS: A complete ROS was performed and is negative except as noted in the HPI. *** Unable to obtain due to altered mental status.   Past Medical History:  Diagnosis Date   Acute kidney injury ([place]) 10/25/2020   Allergic rhinitis    Allergic to cats    pet dander   Allergy    Anemia    Anxiety    Arthritis 09/2018   both feet   Asthma    Back pain    Bipolar disorder ([place])    Cervicalgia    Chronic fatigue syndrome    Chronic low back pain with left-sided sciatica    Constipation    Depression    Diabetes mellitus without complication ([place])    "pre"   Dyspnea    Environmental allergies    Fibromyalgia    GERD (gastroesophageal reflux disease)    Grave's disease    Heart murmur    High cholesterol    History of blood clots    Hypertension    Joint pain    Lactose intolerance    Lower extremity edema    Multiple food allergies    OSA (obstructive sleep apnea)    Osteoporosis    Palpitations    Panic disorder    Rheumatoid  arthritis ([place])    Risk for falls    Sciatica    Severe 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 ([place])    Sleep apnea    no cpap worn   Syncope    Syncope and collapse    Thyroid disease    Vasovagal syncope    Vertigo    Vitamin D deficiency    ***  Family History  Problem Relation Age of Onset   Hypertension Mother    Cirrhosis Mother    Alcohol abuse Mother    Depression Mother    Physical abuse Mother    Hyperlipidemia Mother    Thyroid disease Mother    Anxiety disorder Mother    Arthritis Mother    Hypertension Father    Alcohol abuse Father    Hyperlipidemia Father    Depression Father    Drug abuse Father    Arthritis Father    COPD Father    Gout Father    Hypertension Sister    Alcohol abuse Sister    Drug abuse Sister    Depression Sister    Anxiety disorder Sister    OCD Sister    Thyroid disease Sister    Depression Sister    Thyroid disease Sister  Stroke Maternal Grandmother    Heart attack Maternal Grandmother    Diabetes Maternal Uncle    Stroke Maternal Uncle    Colon cancer Maternal Uncle    Seizures Cousin    Breast cancer Cousin    Breast cancer Cousin    ADD / ADHD Other    Diabetes Other    Hypertension Other    Dementia Neg Hx    Esophageal cancer Neg Hx    Rectal cancer Neg Hx    Stomach cancer Neg Hx    ***  Social History:   reports that she has never smoked. She has never used smokeless tobacco. She reports that she does not currently use alcohol. She reports that she does not use drugs. *** Medications No current facility-administered medications for this encounter.  Current Outpatient Medications:    albuterol (VENTOLIN HFA) 108 (90 Base) MCG/ACT inhaler, Inhale 2 puffs into the lungs every 6 (six) hours as needed for wheezing., Disp: 18 g, Rfl: 2   azelastine (ASTELIN) 0.1 % nasal spray, Place 2 sprays into both nostrils in the morning. Use in each nostril as directed, Disp: , Rfl:    bisacodyl  (DULCOLAX) 10 MG suppository, Place 1 suppository (10 mg total) rectally as needed for moderate constipation., Disp: 12 suppository, Rfl: 0   budesonide-formoterol (SYMBICORT) 80-4.5 MCG/ACT inhaler, Inhale 2 puffs into the lungs 2 (two) times daily as needed (wheezing/sob). , Disp: , Rfl:    cetirizine (ZYRTEC) 10 MG tablet, Take 10 mg by mouth daily., Disp: , Rfl:    cyclobenzaprine (FLEXERIL) 10 MG tablet, Take 10 mg by mouth 3 (three) times daily as needed for muscle spasms., Disp: , Rfl:    diclofenac sodium (VOLTAREN) 1 % GEL, Apply 2 g topically 4 (four) times daily., Disp: 100 g, Rfl: 1   divalproex (DEPAKOTE ER) 500 MG 24 hr tablet, Take 2 tablets (1,000 mg total) by mouth daily., Disp: 180 tablet, Rfl: 0   doxepin (SINEQUAN) 150 MG capsule, Take 1 capsule (150 mg total) by mouth at bedtime., Disp: 90 capsule, Rfl: 0   Dulaglutide (TRULICITY) 3 GX/2.1JH SOPN, Inject 3 mg into the skin every 30 (thirty) days., Disp: , Rfl:    estradiol (ESTRACE) 0.5 MG tablet, Take 0.5 mg by mouth daily., Disp: , Rfl:    fluticasone (FLONASE) 50 MCG/ACT nasal spray, PLACE 1 SPRAY INTO BOTH NOSTRILS AS NEEDED (ALLERGIES/RUNNY NOSE)., Disp: 16 mL, Rfl: 0   Galcanezumab-gnlm (EMGALITY) 120 MG/ML SOAJ, INJECT 120 MG INTO THE SKIN EVERY 30 DAYS, Disp: 3 mL, Rfl: 11   hydrochlorothiazide (MICROZIDE) 12.5 MG capsule, TAKE 1 CAPSULE (12.5 MG TOTAL) BY MOUTH DAILY AS NEEDED (EDEMA)., Disp: 30 capsule, Rfl: 0   ibuprofen (ADVIL) 800 MG tablet, Take 1 tablet (800 mg total) by mouth every 8 (eight) hours as needed (pain)., Disp: 21 tablet, Rfl: 0   linaclotide (LINZESS) 290 MCG CAPS capsule, Take 1 capsule (290 mcg total) by mouth daily before breakfast., Disp: 90 capsule, Rfl: 3   meloxicam (MOBIC) 15 MG tablet, Take 15 mg by mouth daily., Disp: , Rfl:    MOUNJARO 10 MG/0.5ML Pen, Inject 10 mg into the skin once a week., Disp: , Rfl:    MOVANTIK 25 MG TABS tablet, Take 25 mg by mouth daily., Disp: , Rfl:    Multiple  Vitamin (MULTIVITAMIN) tablet, Take 1 tablet by mouth daily., Disp: , Rfl:    phentermine 15 MG capsule, Take 1 capsule (15 mg total) by mouth every morning.,  Disp: 90 capsule, Rfl: 0   polyethylene glycol (MIRALAX) 17 g packet, Take 17 g by mouth daily., Disp: 30 each, Rfl: 1   tapentadol HCl (NUCYNTA) 75 MG tablet, Take 75 mg by mouth in the morning, at noon, and at bedtime., Disp: , Rfl:    Vitamin D, Ergocalciferol, (DRISDOL) 1.25 MG (50000 UNIT) CAPS capsule, Take 1 capsule (50,000 Units total) by mouth every 7 (seven) days. (Patient not taking: Reported on 05/20/2022), Disp: 12 capsule, Rfl: 0 ***  Exam: Current vital signs: BP 100/62   Pulse 88   Temp 98.3 F (36.8 C) (Oral)   Resp 15   Ht 5\' 6"  (1.676 m)   Wt 94.8 kg   SpO2 96%   BMI 33.73 kg/m  Vital signs in last 24 hours: Temp:  [98.1 F (36.7 C)-99 F (37.2 C)] 98.3 F (36.8 C) (01/02 1700) Pulse Rate:  [88-102] 88 (01/02 1830) Resp:  [12-26] 15 (01/02 1830) BP: (90-119)/(45-97) 100/62 (01/02 1830) SpO2:  [94 %-99 %] 96 % (01/02 1830) Weight:  [94.8 kg] 94.8 kg (01/02 0901)  GENERAL: Awake, alert, in no acute distress Psych: Affect appropriate for situation, patient is calm and cooperative with examination Head: Normocephalic and atraumatic, without obvious abnormality EENT: Normal conjunctivae, dry mucous membranes, no OP obstruction LUNGS: Normal respiratory effort. Non-labored breathing on room air CV: Regular rate and rhythm on telemetry ABDOMEN: Soft, non-tender, non-distended Extremities: warm, well perfused, without obvious deformity  NEURO:  Mental Status: Awake, alert, and oriented to person, place, time, and situation. He/She is able to provide a clear and coherent history of present illness. Speech/Language: speech is _____.   Naming, repetition, fluency, and comprehension intact without aphasia *** No neglect is noted Cranial Nerves:  II: PERRL____mm/brisk. visual fields full.  III, IV, VI: EOMI.  Lid elevation symmetric and full.  V: Sensation is intact to light touch and symmetrical to face. Blinks to threat. Moves jaw back and forth.  VII: Face is symmetric resting and smiling. Able to puff cheeks and raise eyebrows.  VIII: Hearing intact to voice IX, X: Palate elevation is symmetric. Phonation normal.  XI: Normal sternocleidomastoid and trapezius muscle strength XII: Tongue protrudes midline without fasciculations.   Motor: 5/5 strength is all muscle groups.  Tone is normal. Bulk is normal.  Sensation: Intact to light touch bilaterally in all four extremities. No extinction to DSS present.  Coordination: FTN intact bilaterally. HKS intact bilaterally. No pronator drift. Alternating hand movements.  DTRs: 2+ throughout.  Gait: Deferred  NIHSS: 1a Level of Conscious.:  1b LOC Questions:  1c LOC Commands:  2 Best Gaze:  3 Visual:  4 Facial Palsy:  5a Motor Arm - left:  5b Motor Arm - Right:  6a Motor Leg - Left:  6b Motor Leg - Right:  7 Limb Ataxia:  8 Sensory:  9 Best Language:  10 Dysarthria:  11 Extinct. and Inatten.:  TOTAL:    Labs I have reviewed labs in epic and the results pertinent to this consultation are: ***  CBC    Component Value Date/Time   WBC 8.6 05/25/2022 1044   RBC 3.78 (L) 05/25/2022 1044   HGB 11.0 (L) 05/25/2022 1044   HGB 12.2 07/09/2020 1536   HCT 34.8 (L) 05/25/2022 1044   HCT 38.4 07/09/2020 1536   PLT 250 05/25/2022 1044   PLT 294 07/09/2020 1536   MCV 92.1 05/25/2022 1044   MCV 90 07/09/2020 1536   MCH 29.1 05/25/2022 1044   MCHC  31.6 05/25/2022 1044   RDW 14.7 05/25/2022 1044   RDW 13.8 07/09/2020 1536   LYMPHSABS 0.8 05/25/2022 1044   LYMPHSABS 1.8 07/09/2020 1536   MONOABS 1.8 (H) 05/25/2022 1044   EOSABS 0.0 05/25/2022 1044   EOSABS 0.2 07/09/2020 1536   BASOSABS 0.0 05/25/2022 1044   BASOSABS 0.0 07/09/2020 1536    CMP     Component Value Date/Time   NA 138 05/25/2022 1044   NA 143 07/09/2020 1536   K 3.9  05/25/2022 1044   CL 105 05/25/2022 1044   CO2 24 05/25/2022 1044   GLUCOSE 71 05/25/2022 1044   BUN 22 (H) 05/25/2022 1044   BUN 19 07/09/2020 1536   CREATININE 1.04 (H) 05/25/2022 1044   CREATININE 0.96 08/03/2021 1352   CREATININE 0.85 06/12/2014 2031   CALCIUM 8.3 (L) 05/25/2022 1044   PROT 6.4 (L) 05/25/2022 1044   PROT 7.0 07/09/2020 1536   ALBUMIN 3.2 (L) 05/25/2022 1044   ALBUMIN 4.2 07/09/2020 1536   AST 27 05/25/2022 1044   AST 12 (L) 08/03/2021 1352   ALT 11 05/25/2022 1044   ALT 14 08/03/2021 1352   ALKPHOS 54 05/25/2022 1044   BILITOT 0.4 05/25/2022 1044   BILITOT 0.3 08/03/2021 1352   GFRNONAA >60 05/25/2022 1044   GFRNONAA >60 08/03/2021 1352   GFRAA 95 07/09/2020 1536    Lipid Panel     Component Value Date/Time   CHOL 227 (H) 07/09/2020 1536   TRIG 92 10/25/2020 1940   HDL 142 07/09/2020 1536   CHOLHDL 1.6 07/09/2020 1536   LDLCALC 67 07/09/2020 1536     Imaging I have reviewed the images obtained:***  CT-scan of the brain  MRI examination of the brain  Assessment: ***  Impression:***  Recommendations: - - - -  Pt seen by NP/Neuro and later by MD. Note/plan to be edited by MD as needed.  [place] , MSN, AGACNP-BC [facility] See Amion for schedule and pager information 05/25/2022 6:50 PM

## 2022-05-25 NOTE — ED Notes (Signed)
Admit provider remains at bedside 

## 2022-05-25 NOTE — ED Notes (Signed)
[name] (significant other) would like to be called with a status update on pt. Phone is [phone]

## 2022-05-25 NOTE — ED Triage Notes (Addendum)
Pt arrived via [facility] from home. Pt told EMS she fell on floor at 0300 today and made several attempts to get up off of floor, burt was unsuccessful. Pt's son last seen pt two days ago.  Per EMS, pt unable to follow commands. Pt A&Ox4 w/intermittent confusion. Pt not on blood thinners. Pt c/o left nee pain and head pain. Pt's left knee is erythematous and has a small abrasion

## 2022-05-25 NOTE — ED Notes (Signed)
Patient transported to X-ray 

## 2022-05-25 NOTE — ED Notes (Signed)
I attempted to obtain pt's blood from [name] and was unsuccessful. I asked phlebotomy if they could try and she stated she will take a look.

## 2022-05-25 NOTE — ED Notes (Signed)
Received verbal report from Alana B RN at this time °

## 2022-05-25 NOTE — ED Notes (Signed)
I attempted to gets pt's blood, but was unsuccessful. I notified Dr gray and He's going to place [name].

## 2022-05-25 NOTE — ED Notes (Addendum)
Neuro provider at bedside at this time

## 2022-05-25 NOTE — ED Provider Notes (Addendum)
[facility] EMERGENCY DEPARTMENT Provider Note  CSN: [id number] Arrival date & time: 05/25/22 4098  Chief Complaint(s) Fall  HPI [name] is a 57 y.o. female with past medical history as below, significant for bipolar disorder, DM, GERD, graves disease, RA,  who presents to the ED with complaint of fall. Pt reports multiple falls over the past 12 months, she has been having LOC intermittently, feeling light headed when standing up quickly. Pt reports she woke up around 0300 or so to take her dogs out, she remembers getting out of bed but does not recall what happened next, then she woke up on the floor with dog licking her face. She was unable to get up off the floor so laid there until her son came home whom she lives with (he was out celebrating for new years per the pt). Has difficulty with ambulation at baseline, uses a cane intermittently. Pt has pain to her b/l knees, abdomen and to her low back. Feels the abd/low back pain is chronic put the knee pain is new. Denies use of thinners, denies incontinence of urine/bowel. No n/v, no cp or dib reported. Reports feeling fatigued over the past few months.    Past Medical History Past Medical History:  Diagnosis Date   Acute kidney injury (HCC) 10/25/2020   Allergic rhinitis    Allergic to cats    pet dander   Allergy    Anemia    Anxiety    Arthritis 09/2018   both feet   Asthma    Back pain    Bipolar disorder (HCC)    Cervicalgia    Chronic fatigue syndrome    Chronic low back pain with left-sided sciatica    Constipation    Depression    Diabetes mellitus without complication (HCC)    "pre"   Dyspnea    Environmental allergies    Fibromyalgia    GERD (gastroesophageal reflux disease)    Grave's disease    Heart murmur    High cholesterol    History of blood clots    Hypertension    Joint pain    Lactose intolerance    Lower extremity edema    Multiple food allergies    OSA (obstructive sleep  apnea)    Osteoporosis    Palpitations    Panic disorder    Rheumatoid arthritis (HCC)    Risk for falls    Sciatica    Severe 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 (HCC)    Sleep apnea    no cpap worn   Syncope    Syncope and collapse    Thyroid disease    Vasovagal syncope    Vertigo    Vitamin D deficiency    Patient Active Problem List   Diagnosis Date Noted   Lumbosacral radiculopathy at L5 01/28/2022   Lumbosacral radiculopathy at S1 01/28/2022   Multiple neurological symptoms 06/23/2021   Protein-calorie malnutrition, mild (HCC) 06/23/2021   Acute respiratory disease due to COVID-19 virus 10/25/2020   AKI (acute kidney injury) (HCC) 10/25/2020   Metabolic syndrome 10/21/2020   Constipation 07/09/2020   Subclinical hypothyroidism 05/11/2019   Prediabetes 05/11/2019   Vitamin D deficiency 05/11/2019   OSA (obstructive sleep apnea) 05/11/2019   Intractable chronic migraine without aura and with status migrainosus 01/14/2016   Risk for falls 09/02/2015   Insomnia 05/27/2015   Severe depressed bipolar II disorder without psychotic features (HCC) 02/25/2015   Class 3 severe obesity with  serious comorbidity and body mass index (BMI) of 40.0 to 44.9 in adult [facility](HCC) 11/14/2014   Severe 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 (HCC) 10/10/2014   GAD (generalized anxiety disorder) 10/10/2014   Panic disorder with agoraphobia 10/10/2014   Social anxiety disorder 10/10/2014   Jaw pain-acute TMJ 09/24/14 09/24/2014   Asthma 08/01/2014   Low back pain 07/24/2014   Encounter for monitoring opioid maintenance therapy 07/02/2014   Pharyngoesophageal dysphagia 06/24/2014   S/P gastric bypass 06/24/2014   Protein-calorie malnutrition, severe (HCC) 11/03/2012   Drug overdose, multiple drugs 11/01/2012   Hypokalemia 11/01/2012   Migraine 05/22/2012   Depression 11/22/2011   Chronic pain 11/22/2011   Graves' disease    GERD (gastroesophageal  reflux disease)    Need for prophylactic postmenopausal hormone replacement therapy 11/03/2011   Allergic rhinitis 09/28/2011   Anemia 09/28/2011   Essential hypertension 09/28/2011   Home Medication(s) Prior to Admission medications   Medication Sig Start Date End Date Taking? Authorizing Provider  albuterol (VENTOLIN HFA) 108 (90 Base) MCG/ACT inhaler Inhale 2 puffs into the lungs every 6 (six) hours as needed for wheezing. 12/20/19   Whitmire, Thermon Leylandawn W, FNP  azelastine (ASTELIN) 0.1 % nasal spray Place 2 sprays into both nostrils in the morning. Use in each nostril as directed    [provider]  bisacodyl (DULCOLAX) 10 MG suppository Place 1 suppository (10 mg total) rectally as needed for moderate constipation. 01/25/22   Sabas SousBero, Michael M, MD  budesonide-formoterol (SYMBICORT) 80-4.5 MCG/ACT inhaler Inhale 2 puffs into the lungs 2 (two) times daily as needed (wheezing/sob).     [provider]  cetirizine (ZYRTEC) 10 MG tablet Take 10 mg by mouth daily. 06/16/21   [provider]  cyclobenzaprine (FLEXERIL) 10 MG tablet Take 10 mg by mouth 3 (three) times daily as needed for muscle spasms. 06/16/21   [provider]  diclofenac sodium (VOLTAREN) 1 % GEL Apply 2 g topically 4 (four) times daily. 12/12/18   Vivi BarrackWagoner, Matthew R, DPM  divalproex (DEPAKOTE ER) 500 MG 24 hr tablet Take 2 tablets (1,000 mg total) by mouth daily. 05/20/22   Oletta DarterAgarwal, [place], MD  doxepin (SINEQUAN) 150 MG capsule Take 1 capsule (150 mg total) by mouth at bedtime. 05/20/22   Oletta DarterAgarwal, [place], MD  Dulaglutide (TRULICITY) 3 MG/0.5ML SOPN Inject 3 mg into the skin every 30 (thirty) days.    [provider]  estradiol (ESTRACE) 0.5 MG tablet Take 0.5 mg by mouth daily. 07/20/19   [provider]  fluticasone (FLONASE) 50 MCG/ACT nasal spray PLACE 1 SPRAY INTO BOTH NOSTRILS AS NEEDED (ALLERGIES/RUNNY NOSE). 10/22/20   Whitmire, Dawn W, FNP  Galcanezumab-gnlm (EMGALITY) 120 MG/ML SOAJ  INJECT 120 MG INTO THE SKIN EVERY 30 DAYS 04/21/21   Anson FretAhern, Antonia B, MD  hydrochlorothiazide (MICROZIDE) 12.5 MG capsule TAKE 1 CAPSULE (12.5 MG TOTAL) BY MOUTH DAILY AS NEEDED (EDEMA). 04/20/21   Corinna CapraBrown, Angel A, DO  ibuprofen (ADVIL) 800 MG tablet Take 1 tablet (800 mg total) by mouth every 8 (eight) hours as needed (pain). 04/09/22   Zenia ResidesBanister, Pamela K, MD  linaclotide [facility](LINZESS) 290 MCG CAPS capsule Take 1 capsule (290 mcg total) by mouth daily before breakfast. 07/15/21   Helane RimaWallace, Erica, DO  meloxicam (MOBIC) 15 MG tablet Take 15 mg by mouth daily. 06/17/20   [provider]  MOUNJARO 10 MG/0.5ML Pen Inject 10 mg into the skin once a week. 04/29/22   [provider]  MOVANTIK 25 MG TABS tablet  Take 25 mg by mouth daily. 07/27/19   [provider]  Multiple Vitamin (MULTIVITAMIN) tablet Take 1 tablet by mouth daily.    [provider]  phentermine 15 MG capsule Take 1 capsule (15 mg total) by mouth every morning. 08/12/21   Helane Rima, DO  polyethylene glycol (MIRALAX) 17 g packet Take 17 g by mouth daily. 01/25/22   Sabas Sous, MD  tapentadol HCl (NUCYNTA) 75 MG tablet Take 75 mg by mouth in the morning, at noon, and at bedtime.    [provider]  Vitamin D, Ergocalciferol, (DRISDOL) 1.25 MG (50000 UNIT) CAPS capsule Take 1 capsule (50,000 Units total) by mouth every 7 (seven) days. Patient not taking: Reported on 05/20/2022 04/07/21   [place] Nickel, DO                                                                                                                                    Past Surgical History Past Surgical History:  Procedure Laterality Date   ABDOMINAL HYSTERECTOMY  2006   COLONOSCOPY     GASTRIC BYPASS  2008   Family History Family History  Problem Relation Age of Onset   Hypertension Mother    Cirrhosis Mother    Alcohol abuse Mother    Depression Mother    Physical abuse Mother    Hyperlipidemia Mother    Thyroid  disease Mother    Anxiety disorder Mother    Arthritis Mother    Hypertension Father    Alcohol abuse Father    Hyperlipidemia Father    Depression Father    Drug abuse Father    Arthritis Father    COPD Father    Gout Father    Hypertension Sister    Alcohol abuse Sister    Drug abuse Sister    Depression Sister    Anxiety disorder Sister    OCD Sister    Thyroid disease Sister    Depression Sister    Thyroid disease Sister    Stroke Maternal Grandmother    Heart attack Maternal Grandmother    Diabetes Maternal Uncle    Stroke Maternal Uncle    Colon cancer Maternal Uncle    Seizures Cousin    Breast cancer Cousin    Breast cancer Cousin    ADD / ADHD Other    Diabetes Other    Hypertension Other    Dementia Neg Hx    Esophageal cancer Neg Hx    Rectal cancer Neg Hx    Stomach cancer Neg Hx     Social History Social History   Tobacco Use   Smoking status: Never   Smokeless tobacco: Never  Vaping Use   Vaping Use: Never used  Substance Use Topics   Alcohol use: Not Currently    Comment: 1-2 drinks/month   Drug use: No   Allergies Bee venom, Coconut fatty acids, Mushroom ext cmplx(shiitake-reishi-mait), Nutritional  supplements, Other, Shellfish allergy, Strawberry extract, Hydrocodone-acetaminophen, Latex, Molds & smuts, and Hydrocodone-acetaminophen  Review of Systems Review of Systems  Constitutional:  Positive for fatigue. Negative for chills and fever.  HENT:  Negative for facial swelling and trouble swallowing.   Eyes:  Negative for photophobia and visual disturbance.  Respiratory:  Negative for cough and shortness of breath.   Cardiovascular:  Negative for chest pain and palpitations.  Gastrointestinal:  Positive for abdominal pain. Negative for nausea and vomiting.  Endocrine: Negative for polydipsia and polyuria.  Genitourinary:  Negative for difficulty urinating and hematuria.  Musculoskeletal:  Positive for arthralgias and back pain. Negative  for gait problem and joint swelling.  Skin:  Negative for pallor and rash.  Neurological:  Positive for syncope. Negative for headaches.  Psychiatric/Behavioral:  Negative for agitation and confusion.     Physical Exam Vital Signs  I have reviewed the triage vital signs BP 112/71   Pulse 95   Temp 98.1 F (36.7 C) (Oral)   Resp 16   Ht 5\' 6"  (1.676 m)   Wt 94.8 kg   SpO2 94%   BMI 33.73 kg/m  Physical Exam Vitals and nursing note reviewed.  Constitutional:      General: She is not in acute distress.    Appearance: Normal appearance. She is obese. She is not ill-appearing.  HENT:     Head: Normocephalic and atraumatic. No right periorbital erythema or left periorbital erythema.     Jaw: There is normal jaw occlusion.     Right Ear: External ear normal.     Left Ear: External ear normal.     Nose: Nose normal.     Mouth/Throat:     Mouth: Mucous membranes are moist.  Eyes:     General: No scleral icterus.       Right eye: No discharge.        Left eye: No discharge.     Extraocular Movements: Extraocular movements intact.     Pupils: Pupils are equal, round, and reactive to light.  Cardiovascular:     Rate and Rhythm: Normal rate and regular rhythm.     Pulses: Normal pulses.     Heart sounds: Normal heart sounds.  Pulmonary:     Effort: Pulmonary effort is normal. No respiratory distress.     Breath sounds: Normal breath sounds.  Abdominal:     General: Abdomen is flat.     Palpations: Abdomen is soft.     Tenderness: There is generalized abdominal tenderness. There is no guarding or rebound.     Comments: Soft not peritoneal   Musculoskeletal:        General: Normal range of motion.     Cervical back: Normal range of motion. No rigidity.     Right lower leg: No edema.     Left lower leg: No edema.       Legs:     Comments: Abrasion b/l knee   Skin:    General: Skin is warm and dry.     Capillary Refill: Capillary refill takes less than 2 seconds.   Neurological:     Mental Status: She is alert and oriented to person, place, and time.     GCS: GCS eye subscore is 4. GCS verbal subscore is 5. GCS motor subscore is 6.     Cranial Nerves: Cranial nerves 2-12 are intact.     Sensory: Sensation is intact.     Motor: Motor function is intact.  Coordination: Coordination is intact.  Psychiatric:        Mood and Affect: Mood normal.        Behavior: Behavior normal.     ED Results and Treatments Labs (all labs ordered are listed, but only abnormal results are displayed) Labs Reviewed  CBC WITH DIFFERENTIAL/PLATELET - Abnormal; Notable for the following components:      Result Value   RBC 3.78 (*)    Hemoglobin 11.0 (*)    HCT 34.8 (*)    Monocytes Absolute 1.8 (*)    All other components within normal limits  COMPREHENSIVE METABOLIC PANEL - Abnormal; Notable for the following components:   BUN 22 (*)    Creatinine, Ser 1.04 (*)    Calcium 8.3 (*)    Total Protein 6.4 (*)    Albumin 3.2 (*)    All other components within normal limits  CK  TROPONIN I (HIGH SENSITIVITY)  TROPONIN I (HIGH SENSITIVITY)                                                                                                                          Radiology CT L-SPINE NO CHARGE  Result Date: 05/25/2022 CLINICAL DATA:  Trauma, fall EXAM: CT LUMBAR SPINE WITHOUT CONTRAST TECHNIQUE: Multidetector CT imaging of the lumbar spine was performed without intravenous contrast administration. Multiplanar CT image reconstructions were also generated. RADIATION DOSE REDUCTION: This exam was performed according to the departmental dose-optimization program which includes automated exposure control, adjustment of the mA and/or kV according to patient size and/or use of iterative reconstruction technique. COMPARISON:  MR lumbar spine done on 11/27/2021 FINDINGS: Segmentation: There are 5 non-rib-bearing vertebrae in lumbar region. Alignment: Alignment of posterior margins  of vertebral bodies is within normal limits. Vertebrae: No recent fracture is seen. Degenerative changes are noted in the visualized lower thoracic spine, more so at T10-T11 level. Schmorl's nodes are seen in the upper endplates of bodies of T9 and T10 vertebrae. Paraspinal and other soft tissues: There is mild spinal stenosis at L4-L5 level. There is small pocket of air adjacent to right facet joint at L3-L4 level. Disc levels: There is encroachment of neural foramina by bulging of annulus and bony spurs at L4-L5 and L5-S1 levels, more so on the left side at the L5-S1 level. IMPRESSION: No recent fracture is seen in lumbar spine. Alignment of posterior margins of vertebral bodies is unremarkable. Lumbar spondylosis with encroachment of neural foramina at L4-L5 and L5-S1 levels, more severe on the left side at the L5-S1 level. There is mild spinal stenosis at the L4-L5 level. Degenerative changes are noted in facet joints at multiple levels. Degenerative changes are noted in the visualized lower thoracic spine. Electronically Signed   By: Ernie AvenaPalani  Rathinasamy M.D.   On: 05/25/2022 15:10   CT Cervical Spine Wo Contrast  Result Date: 05/25/2022 CLINICAL DATA:  Trauma, fall EXAM: CT CERVICAL SPINE WITHOUT CONTRAST TECHNIQUE: Multidetector CT imaging of the cervical spine was performed without  intravenous contrast. Multiplanar CT image reconstructions were also generated. RADIATION DOSE REDUCTION: This exam was performed according to the departmental dose-optimization program which includes automated exposure control, adjustment of the mA and/or kV according to patient size and/or use of iterative reconstruction technique. COMPARISON:  MR cervical spine done on 09/10/2021 FINDINGS: Alignment: Alignment of posterior margins of vertebral bodies appears normal. Skull base and vertebrae: No recent fracture is seen. Degenerative changes are noted in cervical spine, more so at C5-C6 and C6-C7 levels. Soft tissues and spinal  canal: There is no central spinal stenosis. Disc levels: There is mild to moderate encroachment of neural foramina at C5-C6 level, more so on the right side. There is moderate to marked encroachment of neural foramina at C6-C7 level. Upper chest: Breathing motion limits evaluation of upper lung fields. Other: There is slightly inhomogeneous attenuation in thyroid. IMPRESSION: No recent fracture is seen in cervical spine. Cervical spondylosis with encroachment of neural foramina at C5-C6 and C6-C7 levels, more so at C6-C7 level. Electronically Signed   By: Elmer Picker M.D.   On: 05/25/2022 15:03   CT ABDOMEN PELVIS WO CONTRAST  Result Date: 05/25/2022 CLINICAL DATA:  Acute generalized abdominal pain after fall. EXAM: CT ABDOMEN AND PELVIS WITHOUT CONTRAST TECHNIQUE: Multidetector CT imaging of the abdomen and pelvis was performed following the standard protocol without IV contrast. RADIATION DOSE REDUCTION: This exam was performed according to the departmental dose-optimization program which includes automated exposure control, adjustment of the mA and/or kV according to patient size and/or use of iterative reconstruction technique. COMPARISON:  January 25, 2008. FINDINGS: Lower chest: Mild left basilar subsegmental atelectasis is noted. Hepatobiliary: No focal liver abnormality is seen. No gallstones, gallbladder wall thickening, or biliary dilatation. Pancreas: Unremarkable. No pancreatic ductal dilatation or surrounding inflammatory changes. Spleen: Normal in size without focal abnormality. Adrenals/Urinary Tract: Adrenal glands are unremarkable. Kidneys are normal, without renal calculi, focal lesion, or hydronephrosis. Bladder is unremarkable. Stomach/Bowel: Status post gastric bypass. The appendix is unremarkable. There is no evidence of bowel obstruction or inflammation. Vascular/Lymphatic: No significant vascular findings are present. No enlarged abdominal or pelvic lymph nodes. Reproductive:  Status post hysterectomy. No adnexal masses. Other: No abdominal wall hernia or abnormality. No abdominopelvic ascites. Musculoskeletal: No acute or significant osseous findings. IMPRESSION: No acute abnormality seen in the abdomen or pelvis. Electronically Signed   By: Marijo Conception M.D.   On: 05/25/2022 14:59   CT Head Wo Contrast  Result Date: 05/25/2022 CLINICAL DATA:  Trauma, fall EXAM: CT HEAD WITHOUT CONTRAST TECHNIQUE: Contiguous axial images were obtained from the base of the skull through the vertex without intravenous contrast. RADIATION DOSE REDUCTION: This exam was performed according to the departmental dose-optimization program which includes automated exposure control, adjustment of the mA and/or kV according to patient size and/or use of iterative reconstruction technique. COMPARISON:  06/22/2021 FINDINGS: Brain: No acute intracranial findings are seen there are no signs of bleeding within the cranium. Ventricles are not dilated. Cortical sulci are prominent. There is no focal edema or mass effect. Vascular: Unremarkable. Skull: Unremarkable. Sinuses/Orbits: Unremarkable. Other: None. IMPRESSION: No acute intracranial findings are seen in noncontrast CT brain. Atrophy. Electronically Signed   By: Elmer Picker M.D.   On: 05/25/2022 14:57   DG Chest 1 View  Result Date: 05/25/2022 CLINICAL DATA:  [place]. EXAM: CHEST  1 VIEW COMPARISON:  01/24/2022 FINDINGS: Examination limited by overlying extensive EKG leads and wires. The cardiac silhouette, mediastinal and hilar contours are within normal limits given  the AP projection, portable technique and supine position of the patient. No acute pulmonary findings.  The bony thorax is grossly intact. IMPRESSION: Limited examination but no obvious acute cardiopulmonary findings. Electronically Signed   By: Rudie Meyer M.D.   On: 05/25/2022 10:30   DG Pelvis 1-2 Views  Result Date: 05/25/2022 CLINICAL DATA:  [place]. EXAM: PELVIS - 1-2 VIEW  COMPARISON:  04/09/2022 FINDINGS: Examination is limited by a overlying artifacts. Both hips are normally located. No obvious hip or pelvic fractures. The pubic symphysis and SI joints are intact. IMPRESSION: No obvious acute fracture. Electronically Signed   By: Rudie Meyer M.D.   On: 05/25/2022 10:29   DG Knee Complete 4 Views Left  Result Date: 05/25/2022 CLINICAL DATA:  [place].  Left knee pain. EXAM: LEFT KNEE - COMPLETE 4+ VIEW COMPARISON:  None Available. FINDINGS: Significant clothing artifact limits examination. The joint spaces are maintained. Minimal/early degenerative changes but no acute fracture. No osteochondral abnormality. No joint effusion. IMPRESSION: Limited examination. Minimal/early degenerative changes but no acute bony findings or joint effusion. Electronically Signed   By: Rudie Meyer M.D.   On: 05/25/2022 10:27   DG Knee Complete 4 Views Right  Result Date: 05/25/2022 CLINICAL DATA:  Right knee swelling after a fall. EXAM: RIGHT KNEE - COMPLETE 4+ VIEW COMPARISON:  Right knee x-rays dated January 09, 2019. FINDINGS: No acute fracture or dislocation. No joint effusion. Joint spaces are preserved. Unchanged small tricompartmental marginal osteophytes. Bone mineralization is normal. Soft tissues are unremarkable. IMPRESSION: 1. No acute osseous abnormality. 2. Unchanged mild tricompartmental osteoarthritis. Electronically Signed   By: Obie Dredge M.D.   On: 05/25/2022 10:26    Pertinent labs & imaging results that were available during my care of the patient were reviewed by me and considered in my medical decision making (see MDM for details).  Medications Ordered in ED Medications  sodium chloride 0.9 % bolus 1,000 mL (0 mLs Intravenous Stopped 05/25/22 1046)  Tdap (BOOSTRIX) injection 0.5 mL (0.5 mLs Intramuscular Given 05/25/22 0950)                                                                                                                                      Procedures Ultrasound ED Peripheral IV (Provider)  Date/Time: 05/25/2022 4:34 PM  Performed by: Sloan Leiter, DO Authorized by: Sloan Leiter, DO   Procedure details:    Indications: hydration and poor IV access     Skin Prep: chlorhexidine gluconate     Location:  Right AC   Angiocath:  18 G   Bedside Ultrasound Guided: Yes     Images: archived     Patient tolerated procedure without complications: Yes     Dressing applied: Yes     (including critical care time)  Medical Decision Making / ED Course   MDM:  NAKEESHA BOWLER is a 57 y.o. female with past medical  history as below, significant for bipolar disorder, DM, GERD, graves disease, RA,  who presents to the ED with complaint of fall. The complaint involves an extensive differential diagnosis and also carries with it a high risk of complications and morbidity.  Serious etiology was considered. Ddx includes but is not limited to: Differential diagnoses for head trauma includes subdural hematoma, epidural hematoma, acute concussion, traumatic subarachnoid hemorrhage, cerebral contusions, rhabdo, aki, hypovolemia, metabolic, electrolyte derangement, infectious etc.   On initial assessment the patient is: resting comfortably, NAD, GCS 15, AOx3 Vital signs and nursing notes were reviewed    Labs reviewed, stable, hgb is mildly depleted but similar to her typical baseline. CPK not elev, trop neg x2 EKG w/ artifact but appears similar to her prior, will repeat Get orthostatic vitals signs She is able to tolerate PO intake w/o difficulty. Pt re-assessed, she is feeling better, will PO challenge and try to ambulate her to see if symptoms re-occur  4:58 PM  Pt unable to ambulate safely with nurse asssit, felt like she was going to have LOC, felt light headed.  Pt w/ recurrent syncopal episodes and frequent falls, son at home reports he cannot care for her  Pt signed out to incoming EDP pending admission for further syncope w/u,  would benefit from echo likely     Additional history obtained: -Additional history obtained from [name] -External records from outside source obtained and reviewed including: Chart review including previous notes, labs, imaging, consultation notes including prior ED visits, prior labs/imaging/ home medications    Lab Tests: -I ordered, reviewed, and interpreted labs.   The pertinent results include:   Labs Reviewed  CBC WITH DIFFERENTIAL/PLATELET - Abnormal; Notable for the following components:      Result Value   RBC 3.78 (*)    Hemoglobin 11.0 (*)    HCT 34.8 (*)    Monocytes Absolute 1.8 (*)    All other components within normal limits  COMPREHENSIVE METABOLIC PANEL - Abnormal; Notable for the following components:   BUN 22 (*)    Creatinine, Ser 1.04 (*)    Calcium 8.3 (*)    Total Protein 6.4 (*)    Albumin 3.2 (*)    All other components within normal limits  CK  TROPONIN I (HIGH SENSITIVITY)  TROPONIN I (HIGH SENSITIVITY)    Notable for as above  EKG   EKG Interpretation  Date/Time:  Tuesday May 25 2022 16:40:50 EST Ventricular Rate:  92 PR Interval:  207 QRS Duration: 95 QT Interval:  388 QTC Calculation: 480 R Axis:   -22 Text Interpretation: Sinus rhythm Prolonged PR interval Borderline left axis deviation Abnormal R-wave progression, early transition Borderline T abnormalities, anterior leads similar to prior 4/23 Confirmed by Wynona Dove ([id number]) on 05/25/2022 4:53:28 PM         Imaging Studies ordered: I ordered imaging studies including CT head CT L spine Ct cervical spine CTAP CXR Pelvis XR, Knee b/l XR I independently visualized the following imaging with scope of interpretation limited to determining acute life threatening conditions related to emergency care: chronic changes noted, no fx I independently visualized and interpreted imaging. I agree with the radiologist interpretation   Medicines ordered and prescription drug management: Meds  ordered this encounter  Medications   sodium chloride 0.9 % bolus 1,000 mL   Tdap (BOOSTRIX) injection 0.5 mL    -I have reviewed the patients home medicines and have made adjustments as needed   Consultations Obtained: I requested consultation with  the [name],  and discussed lab and imaging findings as well as pertinent plan - they recommend: [name]   Cardiac Monitoring: The patient was maintained on a cardiac monitor.  I personally viewed and interpreted the cardiac monitored which showed an underlying rhythm of: NSR  Social Determinants of Health:  Diagnosis or treatment significantly limited by social determinants of health: obesity   Reevaluation: After the interventions noted above, I reevaluated the patient and found that they have improved  Co morbidities that complicate the patient evaluation  Past Medical History:  Diagnosis Date   Acute kidney injury (HCC) 10/25/2020   Allergic rhinitis    Allergic to cats    pet dander   Allergy    Anemia    Anxiety    Arthritis 09/2018   both feet   Asthma    Back pain    Bipolar disorder (HCC)    Cervicalgia    Chronic fatigue syndrome    Chronic low back pain with left-sided sciatica    Constipation    Depression    Diabetes mellitus without complication (HCC)    "pre"   Dyspnea    Environmental allergies    Fibromyalgia    GERD (gastroesophageal reflux disease)    Grave's disease    Heart murmur    High cholesterol    History of blood clots    Hypertension    Joint pain    Lactose intolerance    Lower extremity edema    Multiple food allergies    OSA (obstructive sleep apnea)    Osteoporosis    Palpitations    Panic disorder    Rheumatoid arthritis (HCC)    Risk for falls    Sciatica    Severe 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 (HCC)    Sleep apnea    no cpap worn   Syncope    Syncope and collapse    Thyroid disease    Vasovagal syncope    Vertigo    Vitamin D deficiency        Dispostion: Disposition decision including need for hospitalization was considered, and patient dispo pending at shift change    Final Clinical Impression(s) / ED Diagnoses Final diagnoses:  Syncope and collapse     This chart was dictated using voice recognition software.  Despite best efforts to proofread,  errors can occur which can change the documentation meaning.    Sloan Leiter, DO 05/25/22 [id number]    Sloan Leiter, DO 05/25/22 [phone]

## 2022-05-25 NOTE — Assessment & Plan Note (Addendum)
Stroke v other etiologies. Mildly slurred speech with 5/5 LE str BL with intact sensation on exam, but endorses weakness. Differentials as noted above. -Admit to [facility] med-tele, attending Dr. McDiarmid -Neurology consulted, appreciate recommendations -MRI Head -Telemetry 24 hrs -Seizure/fall precautions -f/u UDS -TSH, Folate, B12, AM cortisol

## 2022-05-25 NOTE — ED Notes (Signed)
Admit provider at bedside, pt adjusted in bed and purewick replaced at this time. Pt was advised that we needed a UA sample and if she was unable to urinate to obtain a sample we would have to complete a in and out cath

## 2022-05-25 NOTE — Discharge Instructions (Signed)
Have someone stay with you until you feel stable. Do not drive, operate machinery, or play sports until your caregiver says it is okay. Keep all follow-up appointments as directed by your caregiver. Lie down right away if you start feeling like you might faint. Breathe deeply and steadily. Wait until all the symptoms have passed.Drink enough fluids to keep your urine clear or pale yellow. If you are taking blood pressure or heart medicine, get up slowly, taking several minutes to sit and then stand. This can reduce dizziness. SEEK IMMEDIATE MEDICAL CARE IF: You have a severe headache. You have unusual pain in the chest, abdomen, or back. You are bleeding from the mouth or rectum, or you have a black or tarry stool. You have an irregular or very fast heartbeat. You have pain with breathing. You have repeated fainting or seizure-like jerking during an episode. You faint when sitting or lying down. You have confusion. You have difficulty walking. You have severe weakness. You have vision problems. If you fainted, call your local emergency services - do not drive yourself to the hospital.   Please return to the emergency department immediately for any new or concerning symptoms, or if you get worse. 
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
## 2022-05-25 NOTE — ED Notes (Signed)
When pt was standing to get orthostatics pt had to hold onto me. Pt stated she felt dizzy and pt was unsteady standing at bedside. I had to assist pt

## 2022-05-25 NOTE — ED Provider Notes (Signed)
Care of patient assumed from Dr. Pearline Cables.  Overnight, this patient had a loss of consciousness at home.  She awoke to laying on the floor with her dog licking her face.  She was too weak to get up off of the floor.  ED workup has been unremarkable but patient continues to not be able to walk. Physical Exam  BP 111/60   Pulse 92   Temp 98.3 F (36.8 C) (Oral)   Resp 20   Ht 5\' 6"  (1.676 m)   Wt 94.8 kg   SpO2 97%   BMI 33.73 kg/m   Physical Exam Vitals and nursing note reviewed.  Constitutional:      General: She is not in acute distress.    Appearance: Normal appearance. She is well-developed. She is not ill-appearing, toxic-appearing or diaphoretic.  HENT:     Head: Normocephalic and atraumatic.     Right Ear: External ear normal.     Left Ear: External ear normal.     Nose: Nose normal.     Mouth/Throat:     Mouth: Mucous membranes are moist.     Comments: Possible very small area of right anterior tongue bite Eyes:     Extraocular Movements: Extraocular movements intact.     Conjunctiva/sclera: Conjunctivae normal.  Cardiovascular:     Rate and Rhythm: Normal rate and regular rhythm.     Heart sounds: No murmur heard. Pulmonary:     Effort: Pulmonary effort is normal. No respiratory distress.  Abdominal:     General: There is no distension.     Palpations: Abdomen is soft.     Tenderness: There is no abdominal tenderness.  Musculoskeletal:        General: No swelling or deformity.     Cervical back: Normal range of motion and neck supple. No rigidity.     Right lower leg: No edema.     Left lower leg: No edema.     Comments: Erythema and tenderness to skin overlying left knee consistent with carpet abrasion  Skin:    General: Skin is warm and dry.     Capillary Refill: Capillary refill takes less than 2 seconds.     Coloration: Skin is not jaundiced or pale.  Neurological:     Mental Status: She is alert.     Sensory: Sensory deficit (Endorses diminished sensation on  LLE) present.     Motor: Weakness (Slight, LLE) and tremor (LUE intention tremor) present.     Coordination: Coordination is intact. Finger-Nose-Finger Test normal.     Comments: Speech is mildly slurred  Psychiatric:        Mood and Affect: Mood normal.        Behavior: Behavior normal.     Procedures  Procedures  ED Course / MDM    Medical Decision Making Amount and/or Complexity of Data Reviewed Labs: ordered. Radiology: ordered.  Risk Prescription drug management. Decision regarding hospitalization.   On assessment, patient is awake and alert.  She seems to have mildly slurred speech and some very mild word finding ability.  She was unable to think of the word "knee".  She states that her speech seems normal to her but her son had concerns of her speech earlier today at home.  She does have some mild asymmetric weakness in her left leg and endorses diminished sensation as well.  Noncon CT head did not show acute findings.  Will obtain MRI.  Neurology was consulted.  They did evaluate in the  ED.  Their recommendations are pending at time of admission.  Patient was admitted to medicine for further management.       Godfrey Pick, MD 05/25/22 [phone]
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
## 2022-05-25 NOTE — Hospital Course (Signed)
[name] is a 57 y.o.female with a history of vasovagal syncope, T2DM, HTN, Bipolar disorder, graves disease, RA, and s/p gastric bypass in 2008 who was admitted to the [facility] for syncope and generalized weakness. Her hospital course is detailed below:  Syncope Admitted after unwitnessed syncopal episode. CT and MRI scans of brain, pelvis, and abdomen, as well as xray survey of extremities and pelvis unremarkable for acute pathology. Neurology was consulted, did not think neurologic cause to be likely.  Patient previously diagnosed with vasovagal syncope by [facility] with tilt table test in 2017.  TSH, B12, and folate WNL.  Considered contribution of polypharmacy to patient's presentation given multiple medications with anticholinergic and anti-hypertensive effects.  Patient instructed to stop taking Zyrtec and hydrochlorothiazide at discharge.  Recommend tapering anticholinergic doxepin outpatient.  OT recommended discharge home with frequent assistance for ADLs as necessary.  Instructed patient not to drive or to swim unaccompanied along with other safety precautions provided on discharge.  Urinary retention Patient noted to have low urinary output and bladder scan showed 816 mL of retained urine.  Patient required in and out catheterization. Patient was thereafter monitored with Q6H bladder scans and was able to spontaneously void normally with postvoid residuals of 0 mL each time.  Discontinued phentermine at discharge due to urinary retention concerns (patient last took phentermine 2 weeks ago but has refill ready at pharmacy).  Bipolar disorder Patient maintained on home valproate.  Doxepin held this admission in setting of possible adverse effects of anticholinergic medicines.  Restarted doxepin at discharge.  Recommend tapering doxepin outpatient in setting of likely vasovagal syncope.  Type 2 diabetes mellitus without complications ([place]) Held patient's  Mounjaro in setting of syncope and hypotension.  Patient found to be hypoglycemic with CBG of 55 on hospital day 1, but increased appropriately with orange juice and food; CBGs remained normal afterwards.  Restarted Mounjaro at discharge.  Other chronic conditions were medically managed with home medications and formulary alternatives as necessary (i.e. gastric bypass, left heel pain)  PCP Follow-up Recommendations: Recommend tapering doxepin with psychiatry due to anticholinergic effects in setting of syncope and hypotension. We discontinued Zyrtec, flexeril, phentermine, and microzide d/t anticholinergic effects, syncope and hypotension Follow-up with [facility] to discuss recurrent vasovagal syncope and hypotension, previously seen by them repeatedly and diagnosed with vasovagal syncope in 2017 by tilt table test. Use of abdominal binder recommended to patient to assist with hypotension when standing. Physical therapy recommends outpatient PT with aquatic therapy. Neuro recommends outpatient EMG QSART to evaluate for small fiber neuropathy with autonomic dysfunction.

## 2022-05-26 ENCOUNTER — Observation Stay (HOSPITAL_COMMUNITY): Payer: Medicare Other

## 2022-05-26 ENCOUNTER — Encounter (HOSPITAL_COMMUNITY): Payer: Self-pay | Admitting: Student

## 2022-05-26 DIAGNOSIS — M069 Rheumatoid arthritis, unspecified: Secondary | ICD-10-CM | POA: Diagnosis present

## 2022-05-26 DIAGNOSIS — W19XXXA Unspecified fall, initial encounter: Secondary | ICD-10-CM | POA: Diagnosis present

## 2022-05-26 DIAGNOSIS — F319 Bipolar disorder, unspecified: Secondary | ICD-10-CM | POA: Diagnosis present

## 2022-05-26 DIAGNOSIS — Z23 Encounter for immunization: Secondary | ICD-10-CM | POA: Diagnosis present

## 2022-05-26 DIAGNOSIS — E669 Obesity, unspecified: Secondary | ICD-10-CM | POA: Diagnosis present

## 2022-05-26 DIAGNOSIS — M1710 Unilateral primary osteoarthritis, unspecified knee: Secondary | ICD-10-CM | POA: Diagnosis present

## 2022-05-26 DIAGNOSIS — R55 Syncope and collapse: Secondary | ICD-10-CM | POA: Diagnosis present

## 2022-05-26 DIAGNOSIS — E119 Type 2 diabetes mellitus without complications: Secondary | ICD-10-CM | POA: Diagnosis not present

## 2022-05-26 DIAGNOSIS — E11649 Type 2 diabetes mellitus with hypoglycemia without coma: Secondary | ICD-10-CM | POA: Diagnosis not present

## 2022-05-26 DIAGNOSIS — R339 Retention of urine, unspecified: Secondary | ICD-10-CM | POA: Diagnosis present

## 2022-05-26 DIAGNOSIS — I10 Essential (primary) hypertension: Secondary | ICD-10-CM | POA: Diagnosis present

## 2022-05-26 DIAGNOSIS — Z803 Family history of malignant neoplasm of breast: Secondary | ICD-10-CM | POA: Diagnosis not present

## 2022-05-26 DIAGNOSIS — G8929 Other chronic pain: Secondary | ICD-10-CM | POA: Diagnosis present

## 2022-05-26 DIAGNOSIS — E1142 Type 2 diabetes mellitus with diabetic polyneuropathy: Secondary | ICD-10-CM | POA: Diagnosis present

## 2022-05-26 DIAGNOSIS — Z87898 Personal history of other specified conditions: Secondary | ICD-10-CM | POA: Diagnosis not present

## 2022-05-26 DIAGNOSIS — M47812 Spondylosis without myelopathy or radiculopathy, cervical region: Secondary | ICD-10-CM | POA: Diagnosis present

## 2022-05-26 DIAGNOSIS — E0500 Thyrotoxicosis with diffuse goiter without thyrotoxic crisis or storm: Secondary | ICD-10-CM | POA: Diagnosis present

## 2022-05-26 DIAGNOSIS — Z811 Family history of alcohol abuse and dependence: Secondary | ICD-10-CM | POA: Diagnosis not present

## 2022-05-26 DIAGNOSIS — M47816 Spondylosis without myelopathy or radiculopathy, lumbar region: Secondary | ICD-10-CM | POA: Diagnosis present

## 2022-05-26 DIAGNOSIS — I951 Orthostatic hypotension: Secondary | ICD-10-CM | POA: Diagnosis present

## 2022-05-26 DIAGNOSIS — R296 Repeated falls: Secondary | ICD-10-CM | POA: Diagnosis present

## 2022-05-26 DIAGNOSIS — E785 Hyperlipidemia, unspecified: Secondary | ICD-10-CM | POA: Diagnosis present

## 2022-05-26 DIAGNOSIS — Z8616 Personal history of COVID-19: Secondary | ICD-10-CM | POA: Diagnosis not present

## 2022-05-26 DIAGNOSIS — J45909 Unspecified asthma, uncomplicated: Secondary | ICD-10-CM | POA: Diagnosis present

## 2022-05-26 DIAGNOSIS — D649 Anemia, unspecified: Secondary | ICD-10-CM | POA: Diagnosis present

## 2022-05-26 DIAGNOSIS — Z9884 Bariatric surgery status: Secondary | ICD-10-CM | POA: Diagnosis not present

## 2022-05-26 DIAGNOSIS — Z800 Family history of malignant neoplasm of digestive organs: Secondary | ICD-10-CM | POA: Diagnosis not present

## 2022-05-26 DIAGNOSIS — M48061 Spinal stenosis, lumbar region without neurogenic claudication: Secondary | ICD-10-CM | POA: Diagnosis present

## 2022-05-26 LAB — URINALYSIS, ROUTINE W REFLEX MICROSCOPIC
Bilirubin Urine: NEGATIVE
Glucose, UA: NEGATIVE mg/dL
Hgb urine dipstick: NEGATIVE
Ketones, ur: 5 mg/dL — AB
Leukocytes,Ua: NEGATIVE
Nitrite: NEGATIVE
Protein, ur: NEGATIVE mg/dL
Specific Gravity, Urine: 1.023 (ref 1.005–1.030)
pH: 5 (ref 5.0–8.0)

## 2022-05-26 LAB — BASIC METABOLIC PANEL
Anion gap: 9 (ref 5–15)
BUN: 15 mg/dL (ref 6–20)
CO2: 25 mmol/L (ref 22–32)
Calcium: 8.2 mg/dL — ABNORMAL LOW (ref 8.9–10.3)
Chloride: 107 mmol/L (ref 98–111)
Creatinine, Ser: 0.93 mg/dL (ref 0.44–1.00)
GFR, Estimated: >60 mL/min (ref >60)
Glucose, Bld: 55 mg/dL — ABNORMAL LOW (ref 70–99)
Potassium: 3.2 mmol/L — ABNORMAL LOW (ref 3.5–5.1)
Sodium: 141 mmol/L (ref 135–145)

## 2022-05-26 LAB — HEMOGLOBIN A1C
Hgb A1c MFr Bld: 5.3 % (ref 4.8–5.6)
Mean Plasma Glucose: 105 mg/dL

## 2022-05-26 LAB — CBG MONITORING, ED: Glucose-Capillary: 93 mg/dL (ref 70–99)

## 2022-05-26 LAB — IRON AND TIBC
Iron: 82 ug/dL (ref 28–170)
Saturation Ratios: 35 % — ABNORMAL HIGH (ref 10.4–31.8)
TIBC: 235 ug/dL — ABNORMAL LOW (ref 250–450)
UIBC: 153 ug/dL

## 2022-05-26 LAB — CBC
HCT: 33.3 % — ABNORMAL LOW (ref 36.0–46.0)
Hemoglobin: 10.6 g/dL — ABNORMAL LOW (ref 12.0–15.0)
MCH: 29.4 pg (ref 26.0–34.0)
MCHC: 31.8 g/dL (ref 30.0–36.0)
MCV: 92.5 fL (ref 80.0–100.0)
Platelets: 220 10*3/uL (ref 150–400)
RBC: 3.6 MIL/uL — ABNORMAL LOW (ref 3.87–5.11)
RDW: 14.9 % (ref 11.5–15.5)
WBC: 6 10*3/uL (ref 4.0–10.5)
nRBC: 0 % (ref 0.0–0.2)

## 2022-05-26 LAB — VITAMIN B12: Vitamin B-12: 808 pg/mL (ref 180–914)

## 2022-05-26 LAB — GLUCOSE, CAPILLARY
Glucose-Capillary: 70 mg/dL (ref 70–99)
Glucose-Capillary: 170 mg/dL — ABNORMAL HIGH (ref 70–99)

## 2022-05-26 LAB — FERRITIN: Ferritin: 64 ng/mL (ref 11–307)

## 2022-05-26 LAB — RAPID URINE DRUG SCREEN, HOSP PERFORMED
Amphetamines: NOT DETECTED
Barbiturates: NOT DETECTED
Benzodiazepines: NOT DETECTED
Cocaine: NOT DETECTED
Opiates: NOT DETECTED
Tetrahydrocannabinol: NOT DETECTED

## 2022-05-26 LAB — VALPROIC ACID LEVEL: Valproic Acid Lvl: 53 ug/mL (ref 50.0–100.0)

## 2022-05-26 LAB — TSH: TSH: 3.373 u[IU]/mL (ref 0.350–4.500)

## 2022-05-26 LAB — CORTISOL-AM, BLOOD: Cortisol - AM: 10.3 ug/dL (ref 6.7–22.6)

## 2022-05-26 LAB — FOLATE: Folate: 26.7 ng/mL (ref >5.9)

## 2022-05-26 MED ORDER — TAPENTADOL HCL 50 MG PO TABS
50.0000 mg | ORAL_TABLET | Freq: Once | ORAL | Status: AC | PRN
  Administered 2022-05-26: 50 mg via ORAL
  Filled 2022-05-26: qty 1

## 2022-05-26 MED ORDER — LACTATED RINGERS IV BOLUS
1000.0000 mL | Freq: Once | INTRAVENOUS | Status: AC
  Administered 2022-05-26: 1000 mL via INTRAVENOUS

## 2022-05-26 MED ORDER — POTASSIUM CHLORIDE 20 MEQ PO PACK
40.0000 meq | PACK | Freq: Two times a day (BID) | ORAL | Status: DC
  Administered 2022-05-26 – 2022-05-27 (×3): 40 meq via ORAL
  Filled 2022-05-26 (×3): qty 2

## 2022-05-26 MED ORDER — GADOBUTROL 1 MMOL/ML IV SOLN
10.0000 mL | Freq: Once | INTRAVENOUS | Status: AC | PRN
  Administered 2022-05-26: 10 mL via INTRAVENOUS

## 2022-05-26 NOTE — Consult Note (Signed)
NEUROLOGY CONSULTATION NOTE   Date of service: May 26, 2022 Patient Name: [name] MRN:  [id number] DOB:  Jan 14, 1966 Reason for consult: "syncopal episodes x 3" Requesting Provider: McDiarmid, Leighton Roach, MD _ _ _   _ __   _ __ _ _  __ __   _ __   __ _  History of Present Illness  DAIJANAE [name] is a 57 y.o. female with PMH significant for RA, migraines, sleep apnea, recurrent syncopal episodes with collapse, Graves' disease, vertigo, bipolar, chronic fatigue syndrome, hyperlipidemia who presents with multiple episodes of passing out.  She reports that these episodes are always triggered by her trying to stand up.  She took her dog out last night around 9 PM, came back and open the kitchen door and she fell down remembers waking up to her dog licking her face.  It took her a minute or 2 to get back to herself.  She reports she was unable to get up and crawl from the kitchen to the front door and open the front door and then stood up and fell again and then crawled to the bedroom to get her phone and she thought she called 911 which she did not.  Her son came in the morning after celebrating New Year's and found her on the ground.  Son called 911 and patient was brought into the ED.  Patient reports that his episodes are not new, she has these about once or twice a month and sometimes they clustered together.  She remembers that she had them frequently back in January and February 2023.  She reports she barely eats, does not drink enough fluids.  She reports that some of these episodes will have a prodrome of lightheadedness and vertigo.  Standing up quickly will almost always trigger an episode.  She reports all of her episodes occur while she is standing or walking.  These episodes have been going on for the last 2 to 3 years.  She also endorses significant hypersensitivity in bilateral lower extremities below her knees and a lot of pain in her knees.  She follows with Dr. Celene Squibb for headaches  and these episodes.  She also had a tilt table test on 04/02/13 and was diagnosed with vasovagal syncope.   ROS   Constitutional Denies weight loss, fever and chills.  HEENT Denies changes in vision and hearing.   Respiratory Denies SOB and cough.   CV Denies palpitations and CP   GI Denies abdominal pain, nausea, vomiting and diarrhea.   GU Denies dysuria and urinary frequency.   MSK Denies myalgia and joint pain.   Skin Denies rash and pruritus.   Neurological Denies headache and syncope.   Psychiatric + anxiety and depression.    Past History   Past Medical History:  Diagnosis Date   Acute kidney injury (HCC) 10/25/2020   Allergic rhinitis    Allergic to cats    pet dander   Allergy    Anemia    Anxiety    Arthritis 09/2018   both feet   Asthma    Back pain    Bipolar disorder (HCC)    Cervicalgia    Chronic fatigue syndrome    Chronic low back pain with left-sided sciatica    Constipation    Depression    Diabetes mellitus without complication (HCC)    "pre"   Dyspnea    Environmental allergies    Fibromyalgia    GERD (gastroesophageal reflux disease)  Grave's disease    Heart murmur    High cholesterol    History of blood clots    Hypertension    Joint pain    Lactose intolerance    Lower extremity edema    Multiple food allergies    OSA (obstructive sleep apnea)    Osteoporosis    Palpitations    Panic disorder    Rheumatoid arthritis (HCC)    Risk for falls    Sciatica    Severe 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 (HCC)    Sleep apnea    no cpap worn   Syncope    Syncope and collapse    Thyroid disease    Vasovagal syncope    Vertigo    Vitamin D deficiency    Past Surgical History:  Procedure Laterality Date   ABDOMINAL HYSTERECTOMY  2006   COLONOSCOPY     GASTRIC BYPASS  2008   Family History  Problem Relation Age of Onset   Hypertension Mother    Cirrhosis Mother    Alcohol abuse Mother    Depression  Mother    Physical abuse Mother    Hyperlipidemia Mother    Thyroid disease Mother    Anxiety disorder Mother    Arthritis Mother    Hypertension Father    Alcohol abuse Father    Hyperlipidemia Father    Depression Father    Drug abuse Father    Arthritis Father    COPD Father    Gout Father    Hypertension Sister    Alcohol abuse Sister    Drug abuse Sister    Depression Sister    Anxiety disorder Sister    OCD Sister    Thyroid disease Sister    Depression Sister    Thyroid disease Sister    Stroke Maternal Grandmother    Heart attack Maternal Grandmother    Diabetes Maternal Uncle    Stroke Maternal Uncle    Colon cancer Maternal Uncle    Seizures Cousin    Breast cancer Cousin    Breast cancer Cousin    ADD / ADHD Other    Diabetes Other    Hypertension Other    Dementia Neg Hx    Esophageal cancer Neg Hx    Rectal cancer Neg Hx    Stomach cancer Neg Hx    Social History   Socioeconomic History   Marital status: Divorced    Spouse name: Not on file   Number of children: 2   Years of education: 14   Highest education level: Not on file  Occupational History   Occupation: Disabled  Tobacco Use   Smoking status: Never   Smokeless tobacco: Never  Vaping Use   Vaping Use: Never used  Substance and Sexual Activity   Alcohol use: Not Currently    Comment: 1-2 drinks/month   Drug use: No   Sexual activity: Not on file  Other Topics Concern   Not on file  Social History Narrative   Son lives with her. Caffeine use: 1 cup/day of soda   Social Determinants of Health   Financial Resource Strain: Not on file  Food Insecurity: Not on file  Transportation Needs: Not on file  Physical Activity: Not on file  Stress: Not on file  Social Connections: Not on file   Allergies  Allergen Reactions   Bee Venom Anaphylaxis    Other reaction(s): PRURITUS, RASH   Coconut Fatty Acids Anaphylaxis    Patient  allergic to coconut in general   Mushroom Ext  Cmplx(Shiitake-Reishi-Mait) Anaphylaxis   Nutritional Supplements Anaphylaxis and Itching    walnuts   Other Anaphylaxis, Hives and Itching    Ragweed   Shellfish Allergy Anaphylaxis   Strawberry Extract Anaphylaxis   Hydrocodone-Acetaminophen Itching   Latex    Molds & Smuts    Hydrocodone-Acetaminophen Rash    Other reaction(s): Unknown Other reaction(s): Other (See Comments)    Medications  (Not in a hospital admission)    Vitals   Vitals:   05/25/22 2300 05/25/22 2305 05/25/22 2315 05/25/22 2326  BP: 139/66   (!) 124/54  Pulse: (!) 105  (!) 102 (!) 106  Resp:   17 (!) 26  Temp:  99 F (37.2 C)    TempSrc:  Oral    SpO2: 97%  94% 93%  Weight:      Height:         Body mass index is 33.73 kg/m.  Physical Exam   General: Laying comfortably in bed; in no acute distress.  HENT: Normal oropharynx and mucosa. Normal external appearance of ears and nose.  Neck: Supple, no pain or tenderness  CV: No JVD. No peripheral edema.  Pulmonary: Symmetric Chest rise. Normal respiratory effort.  Abdomen: Soft to touch, non-tender.  Ext: No cyanosis, edema, or deformity  Skin: No rash. Normal palpation of skin.   Musculoskeletal: Normal digits and nails by inspection. No clubbing.   Neurologic Examination  Mental status/Cognition: Alert, oriented to self, place, month and year, good attention.  Speech/language: mildly dysarthric, improves as she is talking. Fluent, comprehension intact, object naming intact, repetition intact.  Cranial nerves:   CN II Pupils equal and reactive to light, no VF deficits    CN III,IV,VI EOM intact, no gaze preference or deviation, no nystagmus    CN V normal sensation in V1, V2, and V3 segments bilaterally    CN VII no asymmetry, no nasolabial fold flattening    CN VIII normal hearing to speech    CN IX & X normal palatal elevation, no uvular deviation    CN XI 5/5 head turn and 5/5 shoulder shrug bilaterally    CN XII midline tongue  protrusion   Motor:  Muscle bulk: normal, tone normal, pronator drift none tremor none Mvmt Root Nerve  Muscle Right Left Comments  SA C5/6 Ax Deltoid 5 5   EF C5/6 Mc Biceps 5 5   EE C6/7/8 Rad Triceps 5 5   WF C6/7 Med FCR     WE C7/8 PIN ECU     F Ab C8/T1 U ADM/FDI 5 5   HF L1/2/3 Fem Illopsoas 5 5   KE L2/3/4 Fem Quad 5 5   DF L4/5 D Peron Tib Ant 5 5   PF S1/2 Tibial Grc/Sol 5 5    Reflexes:  Right Left Comments  Pectoralis      Biceps (C5/6) 2 2   Brachioradialis (C5/6) 2 2    Triceps (C6/7) 2 2    Patellar (L3/4)   Politely declined due to bilateral knee and leg pain.   Achilles (S1)      Hoffman      Plantar Withdraws foot Withdraws foot   Jaw jerk    Sensation:  Light touch Significant allodynia in bilateral lower extremities from knees and below to light touch.   Pin prick    Temperature    Vibration   Proprioception    Coordination/Complex Motor:  - Finger to  Nose intact bL - Heel to shin intact BL - Rapid alternating movement are normal - Gait: Deferred for patient's safety.  Labs   CBC:  Recent Labs  Lab 05/25/22 1044  WBC 8.6  NEUTROABS 5.9  HGB 11.0*  HCT 34.8*  MCV 92.1  PLT 765    Basic Metabolic Panel:  Lab Results  Component Value Date   NA 138 05/25/2022   K 3.9 05/25/2022   CO2 24 05/25/2022   GLUCOSE 71 05/25/2022   BUN 22 (H) 05/25/2022   CREATININE 1.04 (H) 05/25/2022   CALCIUM 8.3 (L) 05/25/2022   GFRNONAA >60 05/25/2022   GFRAA 95 07/09/2020   Lipid Panel:  Lab Results  Component Value Date   LDLCALC 67 07/09/2020   HgbA1c:  Lab Results  Component Value Date   HGBA1C 5.1 07/09/2020   Urine Drug Screen:     Component Value Date/Time   LABOPIA NONE DETECTED 06/23/2021 0736   COCAINSCRNUR NONE DETECTED 06/23/2021 0736   COCAINSCRNUR NEGATIVE 05/09/2009 1918   LABBENZ NONE DETECTED 06/23/2021 0736   LABBENZ NEGATIVE 05/09/2009 1918   AMPHETMU NONE DETECTED 06/23/2021 0736   THCU NONE DETECTED 06/23/2021 0736    LABBARB NONE DETECTED 06/23/2021 0736    Alcohol Level     Component Value Date/Time   ETH <10 06/23/2021 0039    CT Head without contrast(Personally reviewed): CTH was negative for a large hypodensity concerning for a large territory infarct or hyperdensity concerning for an ICH  MRI Brain: pending  Impression   JENICE LEINER is a 57 y.o. female with PMH significant for RA, migraines, sleep apnea, recurrent syncopal episodes with collapse, Graves' disease, vertigo, bipolar, chronic fatigue syndrome, hyperlipidemia who presents with multiple episodes of passing out.  Episodes exclusively occur while she is standing up, sometimes has prodrome of lightheadedness/vertigo, passes out for unknown duration and upon awakening does not have a prolonged postictal and is confused for maybe a minute or 2 at most before she is back to her baseline.  These have been a chronic problem for the patient with episodes noted all the way back in 2014 and she also had a tilt table test in 2014 and diagnosed with vasovagal syncope with mixed picture.  Overall, my suspicion for this being seizures is low.  At this time, I would recommend nonpharmacological management of potential orthostasis.  Another possible consideration is small fiber neuropathy with autonomic dysfunction.  However, neck step in workup will be EMG QSART which is a very specialized study and available outpatient only.  My suspicion for this comes from her having significant allodynia in bilateral lower extremities from knees and below which can sometimes be a feature of small fiber neuropathy. No obvious findings concerning for parkinson's or MSA.  Recommendations  - recommend outpatient EMG QSART, followed by follow up with outpatient neurology. - Serum TSH and Cortisol to evaluate for hormonal causes of orthostasis. - Non-Pharmacologic Recs for Orthostatic Hypotension 1) Lifestyle modification. These measures include:  - Arising  slowly, in stages, from supine to seated to standing. This maneuver is most important in the morning, when orthostatic tolerance is lowest.  - Avoiding straining, coughing, and walking in hot weather.These activities reduce venous return and worsen orthostatic hypotension.  - Maintaining hydration and avoiding over-heating.  - Raising the head of the bed 30 to 45 degrees decreases renal perfusion, thereby activating the renin-angiotensin-aldosterone system and decreasing nocturnal diuresis, which can be pronounced in these patients.  These changes relieve orthostatic hypotension  by expanding extracellular fluid volume and may reduce end organ damage by reducing supine HTN.  2) Exercise - walking 30 minutes a day, exercise in a swimming pool, exercise in a recumbent or seated position (using a stationary bike or rowing machine)  3) Abdominal binders   4) Compression Stockings  5) Increased salt and water intake to 2 L to 2.5 L of water a day  6) Modification of meals:  - Avoiding large meals  - Ingesting meals low in carbohydrates  - Alcohol should be avoided during the day as it is a vasodilator  - Drink water with meals  - Avoiding activities or sudden standing immediately after eating  7) Physical Countermaneuvers during daily activities: leg crossing, standing on tip toes, squatting  Can try topical lidocaine for pain in her feets/legs. We will signoff, please feel free to contact us with any questions or concerns.  ______________________________________________________________________   Thank you for the opportunity to take part in the care of this patient. If you have any further questions, please contact the neurology consultation attending.  Signed,  [place] Pager Number [phone] _ _ _   _ __   _ __ _ _  __ __   _ __   __ _

## 2022-05-26 NOTE — Assessment & Plan Note (Addendum)
Hgb now 10.0. 01/24/2022 it was 12.2. MCV is 90.3. Patient has h/o gastric bypass and takes B12 and folate supplements with B12 and folate within normal limits.  Iron studies mostly normal with slightly reduced TIBC and elevated saturations, however iron is normal and all cell line counts continue to trend down making anemia likely dilutional in nature.  Patient endorses poor p.o. intake as she reports eating one meal a day. Patient is post menopausal. Wnl colonoscopy 2021 and s/p hysterectomy. - f/u AM CBC

## 2022-05-26 NOTE — ED Notes (Signed)
Attempted to obtain labs from PIV however will not return blood

## 2022-05-26 NOTE — Assessment & Plan Note (Addendum)
[facility] She has home mounjaro 10 mg and phentermine. She recently started [facility] a week ago. Patient also has h/o gastric bypass. A1c 1 year prior 5.1. CMP with glucose 55 received orange juice and food.  - Hold phentermine and mounjaro  - A1c  - Recheck BG.

## 2022-05-26 NOTE — Progress Notes (Addendum)
Daily Progress Note Intern Pager: [phone]  Patient name: [name] Medical record number: [id number] Date of birth: [birth year] Age: 57 y.o. Gender: female  Primary Care Provider: Berkley Harvey, NP Consultants: Neurology s/o 05/25/2021  Code Status: Full  Pt Overview and Major Events to Date:  1/2 - Admitted   Assessment and Plan: [name] is a 57 y.o. who presented with LOC. At this time most concerned for dysautonomia, hypothyroidism,  polypharmacy.   PMH significant for Bipolar disorder, chronic migraine, T2DM, GERD, Graves disease, RA.   * Syncope Most likely due to polypharmacy. Patient has many anticholinergic effect causing medications including dupixent, nucynta, phentermine, depakote. Currently holding dupixent, claritin, phentermine. UDS negative at this time. Neurology has signed off at this time as syncopal, LOC episode unlikely neurologic in origin. Patient has clinic notes with blood pressures 100s/50s. Seems to be a normal for her; however, polypharmacy and weight loss medication combined with low po intake seems to be contributing to her recurrent syncopal episodes.  - Seizure/fall precautions - Fu TSH, Folate, B12, AM cortisol, Depakote level - Hold doxepin, phentermine, claritin  - Decrease nucynta to once prn  - PT eval and treat  Anemia Hgb now 10.6. 01/24/2022 it was 12.2. MCV is 96. Patient has h/o gastric bypass and takes B12 and folate supplements. She does have very low po intake as she reports eating one meal a day. Patient is post menopausal. Wnl colonoscopy 2021 and s/p hysterectomy.  - F/u AM CBC  - F/u Iron and TIBC, ferritin  - F/u B12 and folate levels   Type 2 diabetes mellitus without complications ([place]) [facility]. She has home mounjaro 10 mg and phentermine. She recently started [facility] a week ago. Patient also has h/o gastric bypass. A1c 1 year prior 5.1. CMP with glucose 55 received orange juice  and food.  - Hold phentermine and mounjaro  - A1c  - Recheck BG.   S/P gastric bypass 2008. At risk for malabsorptive disorders. - F/u B12, folate  - Continue H08, folic acid, multivitamin   Bipolar disorder (HCC) Is on depakote and doxepin. During last admission for similar cause, psychiatric illness was presumed to play a role in her presentation. Unlikely psychiatric role at this time. However, doxepin could be causing anticholinergic effects causing her to retain urine as well as have low blood pressure. Psychiatry was involved in her care during her last hospitalization.   - Depakote level - Holding Doxepin   Graves' disease S/p radioactive iodine therapy. TSH has been relatively stable in recent years. TSH 06/23/2021 4.415 when not on synthroid. Has not taken synthroid since 2018 per patient report.  - TSH as above    FEN/GI: Regular PPx: lovenox Dispo:Home barriers include clinical improvement  Subjective:  Patient says she is frustrated from being in the ED and being poked so many times. She says she otherwise feels well except she still feels slightly weak and says it is hard to lift her legs. Denies SOB, abdominal pain, headache.   Objective: Temp:  [98 F (36.7 C)-99.5 F (37.5 C)] 98 F (36.7 C) (01/03 0944) Pulse Rate:  [79-106] 79 (01/03 1200) Resp:  [11-26] 18 (01/03 1000) BP: (70-139)/(33-97) 95/64 (01/03 1200) SpO2:  [91 %-98 %] 96 % (01/03 1200) Physical Exam: General: Well appearing Cardiovascular: flow murmur, regular rate and rhythm, cap refill < 2 seconds Respiratory: Normal work of breathing, CTAB Abdomen: Soft, non tender, non distended  Extremities: No BLE edema   Laboratory: Most recent CBC Lab Results  Component Value Date   WBC 6.0 05/26/2022   HGB 10.6 (L) 05/26/2022   HCT 33.3 (L) 05/26/2022   MCV 92.5 05/26/2022   PLT 220 05/26/2022   Most recent BMP    Latest Ref Rng & Units 05/26/2022   12:19 PM  BMP  Glucose 70 - 99 mg/dL 55    BUN 6 - 20 mg/dL 15   Creatinine 0.44 - 1.00 mg/dL 0.93   Sodium 135 - 145 mmol/L 141   Potassium 3.5 - 5.1 mmol/L 3.2   Chloride 98 - 111 mmol/L 107   CO2 22 - 32 mmol/L 25   Calcium 8.9 - 10.3 mg/dL 8.2    Lowry Ram, MD 05/26/2022, 1:46 PM  PGY-1, [place] Intern pager: [phone], text pages welcome Secure chat group [place]

## 2022-05-26 NOTE — Assessment & Plan Note (Addendum)
s/p bypass in 2008. At risk for malabsorptive disorders.  Iron normal with mildly decreased TIBC.  B12 and folate WNL. - Continue Q76, folic acid, multivitamin

## 2022-05-26 NOTE — ED Notes (Signed)
929ml of urine drained with the I&O catheter.  Pt states she does feel some relief.

## 2022-05-26 NOTE — ED Notes (Signed)
Paged admit provider reference unable to get labs from pt

## 2022-05-26 NOTE — ED Notes (Signed)
Pt wants to leave, explained to pt that if she leaves it would be against medical advice.  Pt is AAOx4 and able to express needs.  Messaged Dr. McDiarmid and waiting response.  Pt removed all her leads and monitoring devices.

## 2022-05-26 NOTE — Assessment & Plan Note (Addendum)
S/p radioactive iodine therapy. TSH has been relatively stable in recent years. TSH 06/23/2021 4.415 when not on synthroid. Has not taken synthroid since 2018 per patient report.  - TSH as above

## 2022-05-26 NOTE — Progress Notes (Signed)
IVT consult placed for PIV placement with lab draw. Per note, previously placed PIV is not drawing back blood for labs. Secure chat sent to primary RN instructing that IVT doesn't place PIV's for lab draws if current access is functional. RN to notify MD. Will complete consult at this time.  Noriel Guthrie Lorita Officer, RN

## 2022-05-26 NOTE — Plan of Care (Signed)
°  Problem: Education: °Goal: Knowledge of General Education information will improve °Description: Including pain rating scale, medication(s)/side effects and non-pharmacologic comfort measures °Outcome: Progressing °  °Problem: Activity: °Goal: Risk for activity intolerance will decrease °Outcome: Progressing °  °Problem: Nutrition: °Goal: Adequate nutrition will be maintained °Outcome: Progressing °  °Problem: Coping: °Goal: Level of anxiety will decrease °Outcome: Progressing °  °Problem: Elimination: °Goal: Will not experience complications related to bowel motility °Outcome: Progressing °  °Problem: Safety: °Goal: Ability to remain free from injury will improve °Outcome: Progressing °  °

## 2022-05-26 NOTE — Inpatient Diabetes Management (Signed)
Inpatient Diabetes Program Recommendations  AACE/ADA: New Consensus Statement on Inpatient Glycemic Control (2015)  Target Ranges:  Prepandial:   less than 140 mg/dL      Peak postprandial:   less than 180 mg/dL (1-2 hours)      Critically ill patients:  140 - 180 mg/dL   Lab Results  Component Value Date   GLUCAP 100 (H) 06/23/2021   HGBA1C 5.1 07/09/2020    Review of Glycemic Control  Latest Reference Range & Units 05/25/22 10:44 05/26/22 12:19  Glucose 70 - 99 mg/dL 71 55 (L)  (L): Data is abnormally low  Diabetes history: DM2 Outpatient Diabetes medications:  Mounjaro 10 mg weekly Trulicity (not taking) Current orders for Inpatient glycemic control: none  Inpatient Diabetes Program Recommendations:    Given hypoglycemia, please order CBG's ac/hs.  Will continue to follow while inpatient.  Thank you, Reche Dixon, MSN, [place] Diabetes Coordinator Inpatient Diabetes Program [phone] (team pager from 8a-5p)

## 2022-05-26 NOTE — ED Notes (Signed)
Attempted to reach lab to obtain the lab samples at this time

## 2022-05-26 NOTE — Evaluation (Signed)
Occupational Therapy Evaluation Patient Details Name: [name] MRN: [id number] DOB: February 24, 1966 Today's Date: 05/26/2022   History of Present Illness 57 y.o. female presents to [facility] hospital on 05/25/2022 after multiple syncopal episodes. PMH includes RA, migraines, OSA, vasovagal syncope, Graves' disease, vertigo, bipolar, chronic fatigue syndrome, HLD.   Clinical Impression   This 57 yo female admitted with above presents to acute OT with PLOF of being able to manage her basic ADLs and some IADLs but was having multiple episodes of syncope. Pt is not currently open to using a RW or rollator (they make me feel old). Pt reports it is difficult for her to do socks, shoes and pants at home. Eval today limited by pt being lethargic and not wanting to get up. We will continue to follow.      Recommendations for follow up therapy are one component of a multi-disciplinary discharge planning process, led by the attending physician.  Recommendations may be updated based on patient status, additional functional criteria and insurance authorization.   Follow Up Recommendations  No OT follow up     Assistance Recommended at Discharge Frequent or constant Supervision/Assistance  Patient can return home with the following A little help with walking and/or transfers;A little help with bathing/dressing/bathroom;Assistance with cooking/housework;Assistance with feeding;Help with stairs or ramp for entrance;Assist for transportation;Direct supervision/assist for financial management;Direct supervision/assist for medications management    Functional Status Assessment  Patient has had a recent decline in their functional status and demonstrates the ability to make significant improvements in function in a reasonable and predictable amount of time.  Equipment Recommendations  None recommended by OT       Precautions / Restrictions Precautions Precautions: Fall Restrictions Weight Bearing Restrictions: No              ADL either performed or assessed with clinical judgement   ADL Overall ADL's : Needs assistance/impaired Eating/Feeding: Independent;Bed level   Grooming: Set up;Bed level   Upper Body Bathing: Set up;Bed level   Lower Body Bathing: Maximal assistance;Bed level   Upper Body Dressing : Set up;Bed level   Lower Body Dressing: Total assistance;Bed level                       Vision Patient Visual Report: No change from baseline              Pertinent Vitals/Pain Pain Assessment Pain Assessment: No/denies pain     Hand Dominance Right   Extremity/Trunk Assessment Upper Extremity Assessment Upper Extremity Assessment: Overall WFL for tasks assessed           Communication Communication Communication: No difficulties   Cognition Arousal/Alertness: Lethargic (drifting in and out (per RN she has not had anything but her normal meds today)) Behavior During Therapy: Flat affect Overall Cognitive Status: Impaired/Different from baseline                                 General Comments: answering some questions oddly, then when I would repeat question she would answer appropriately                Home Living Family/patient expects to be discharged to:: Private residence Living Arrangements: Children Available Help at Discharge: Family;Available PRN/intermittently (sister and friends) Type of Home: House Home Access: Stairs to enter [employer] of Steps: 2 Entrance Stairs-Rails: None Home Layout: One level     Bathroom Shower/Tub: Tub/shower  unit   Bathroom Toilet: Standard     Home Equipment: Tub bench   Additional Comments: garden tub--has tub bench but says it does not work due to garden tub and toilet too close to tub      Prior Functioning/Environment Prior Level of Function : Needs assist             Mobility Comments: pt ambulates household distances, holding onto furniture. Pt reports she  stands and marches initially, intermittently needs assistance to walk in her room, has a history of multiple falls ADLs Comments: pt requires intermittent assistance with bathing and IADLs, reports ordering door dash rather than cooking        OT Problem List: Decreased activity tolerance;Impaired balance (sitting and/or standing);Decreased cognition;Pain      OT Treatment/Interventions: Self-care/ADL training;DME and/or AE instruction;Patient/family education    OT Goals(Current goals can be found in the care plan section) Acute Rehab OT Goals Patient Stated Goal: to back home OT Goal Formulation: With patient Time For Goal Achievement: 06/09/22 Potential to Achieve Goals: Good  OT Frequency: Min 2X/week       AM-PAC OT "6 Clicks" Daily Activity     Outcome Measure Help from another person eating meals?: None Help from another person taking care of personal grooming?: A Little Help from another person toileting, which includes using toliet, bedpan, or urinal?: A Lot Help from another person bathing (including washing, rinsing, drying)?: A Lot Help from another person to put on and taking off regular upper body clothing?: A Little Help from another person to put on and taking off regular lower body clothing?: A Lot 6 Click Score: 16   End of Session Nurse Communication:  (pt would not get up and go to bathroom to try and urinate)  Activity Tolerance: Patient limited by lethargy (and pt not wanting to get up despite pt's RN wanting to know if she had urinated (she had not since being cathed earlier this AM). Tried to get pt to get up and walk with me into bathroom) Patient left: in bed;with call bell/phone within reach  OT Visit Diagnosis: Other abnormalities of gait and mobility (R26.89);Muscle weakness (generalized) (M62.81);Other symptoms and signs involving cognitive function                Time: 1601-0932 OT Time Calculation (min): 34 min Charges:  OT General Charges $OT  Visit: 1 Visit OT Evaluation $OT Eval Moderate Complexity: 1 Mod OT Treatments $Self Care/Home Management : 8-22 mins  Golden Circle, OTR/L Acute Rehab Services [facility] [place] [phone] Office [phone]    Almon Register 05/26/2022, 6:33 PM

## 2022-05-26 NOTE — ED Notes (Signed)
PT at bedside.

## 2022-05-26 NOTE — ED Notes (Signed)
Pt with noted BGL of 55 on the CMP.  By this point, pt had already eaten some of her lunch. 2 orange juices provided to pt.  MD made aware.

## 2022-05-26 NOTE — Assessment & Plan Note (Signed)
Patient had a bladder scan with 813 cc. I&O with 900cc. Most likely having urinary retention due to the multiple anticholinergic agents patient was on. Dupixent, phentermine, claritin stopped. Nucynta now decreased to once prn compared home TID prn.  - Continue decreased nucynta dose  - Avoid anticholinergic agents  - q6hr bladder scans

## 2022-05-26 NOTE — ED Notes (Signed)
Pt states that she needs to pee, she feels like her bladder is full.  Pt has purewick in place and has been unable to pee.  She said she would like to try to walk to the bathroom.  Pt was able to stand with assistance but very unsteady and unable to walk to the bathroom.  I did a bladder scan and it was showing 844ml in bladder.  Hospitalist team paged.

## 2022-05-26 NOTE — Evaluation (Signed)
Physical Therapy Evaluation Patient Details Name: [name] MRN: [id number] DOB: 30-Sep-1965 Today's Date: 05/26/2022  History of Present Illness  57 y.o. female presents to [facility] hospital on 05/25/2022 after multiple syncopal episodes. PMH includes RA, migraines, OSA, vasovagal syncope, Graves' disease, vertigo, bipolar, chronic fatigue syndrome, HLD.  Clinical Impression  Pt presents to PT with deficits in balance, gait, functional mobility, endurance, power. Pt reports a history of instability, with multiple falls in the last year which she associates with other syncopal episodes. Pt reports typically holding onto furniture for all of her ambulation, and takes multiple short laps in the room prior to attempting to mobilize further around the house. Pt requires constant UE support to maintain balance at this time, PT encourages use of a RW during session however the pt declines. Pt reports prior success with aquatic therapy, PT recommends outpatient follow-up in an effort to improve balance and reduce falls risk.     Recommendations for follow up therapy are one component of a multi-disciplinary discharge planning process, led by the attending physician.  Recommendations may be updated based on patient status, additional functional criteria and insurance authorization.  Follow Up Recommendations Outpatient PT (pt would prefer aquatic therapy, reports past success)      Assistance Recommended at Discharge Intermittent Supervision/Assistance  Patient can return home with the following  A little help with walking and/or transfers;A little help with bathing/dressing/bathroom;Assistance with cooking/housework;Assist for transportation;Help with stairs or ramp for entrance    Equipment Recommendations  (recommend use of SPC vs RW, pt reports she can borrow one from her sister)  Recommendations for Other Services       Functional Status Assessment Patient has had a recent decline in their  functional status and demonstrates the ability to make significant improvements in function in a reasonable and predictable amount of time.     Precautions / Restrictions Precautions Precautions: Fall Restrictions Weight Bearing Restrictions: No      Mobility  Bed Mobility Overal bed mobility: Needs Assistance Bed Mobility: Supine to Sit, Sit to Supine     Supine to sit: Min guard, HOB elevated Sit to supine: Min guard   General bed mobility comments: increased time    Transfers Overall transfer level: Needs assistance Equipment used: 1 person hand held assist Transfers: Sit to/from Stand Sit to Stand: Min guard           General transfer comment: left hand hold, right hand on bedside table    Ambulation/Gait Ambulation/Gait assistance: Min assist Gait Distance (Feet): 25 Feet Assistive device: 1 person hand held assist Gait Pattern/deviations: Step-to pattern, Wide base of support Gait velocity: reduced Gait velocity interpretation: <1.31 ft/sec, indicative of household ambulator   General Gait Details: pt with slowed step-to gait, widened BOS, pt benefiting from hand hold and support of furniture with RUE (pt declines use of walker)  Stairs            Wheelchair Mobility    Modified Rankin (Stroke Patients Only)       Balance Overall balance assessment: Needs assistance Sitting-balance support: No upper extremity supported, Feet supported Sitting balance-Leahy Scale: Fair     Standing balance support: Single extremity supported, Bilateral upper extremity supported, Reliant on assistive device for balance Standing balance-Leahy Scale: Poor                               Pertinent Vitals/Pain Pain Assessment Pain Assessment: Faces Faces  Pain Scale: Hurts even more Pain Location: back Pain Descriptors / Indicators: Aching Pain Intervention(s): Monitored during session    Home Living Family/patient expects to be discharged to::  Private residence Living Arrangements: Children (son, works 9-5) Available Help at Discharge: Family;Available PRN/intermittently (sister and friends also PRN) Type of Home: House Home Access: Stairs to enter Entrance Stairs-Rails: None Entrance Stairs-Number of Steps: 2   Home Layout: One level Home Equipment:  (may be able to loan [facility] or RW from sister)      Prior Function Prior Level of Function : Needs assist             Mobility Comments: pt ambulates household distances, holding onto furniture. Pt reports she stands and marches initially, intermittently needs assistance to walk in her room, has a history of multiple falls ADLs Comments: pt requires intermittent assistance with bathing and IADLs, reports ordering door dash rather than cooking     Hand Dominance        Extremity/Trunk Assessment   Upper Extremity Assessment Upper Extremity Assessment: Overall WFL for tasks assessed    Lower Extremity Assessment Lower Extremity Assessment: Generalized weakness    Cervical / Trunk Assessment Cervical / Trunk Assessment: Kyphotic  Communication   Communication: No difficulties  Cognition Arousal/Alertness: Awake/alert Behavior During Therapy: WFL for tasks assessed/performed Overall Cognitive Status: Within Functional Limits for tasks assessed                                          General Comments General comments (skin integrity, edema, etc.): VSS on RA    Exercises     Assessment/Plan    PT Assessment Patient needs continued PT services  PT Problem List Decreased strength;Decreased activity tolerance;Decreased balance;Decreased mobility;Decreased knowledge of use of DME;Decreased safety awareness;Pain       PT Treatment Interventions Gait training;DME instruction;Functional mobility training;Therapeutic activities;Stair training;Therapeutic exercise;Balance training;Neuromuscular re-education;Patient/family education    PT Goals  (Current goals can be found in the Care Plan section)  Acute Rehab PT Goals Patient Stated Goal: to reduce pain and go home PT Goal Formulation: With patient Time For Goal Achievement: 06/09/22 Potential to Achieve Goals: Fair    Frequency Min 3X/week     Co-evaluation               AM-PAC PT "6 Clicks" Mobility  Outcome Measure Help needed turning from your back to your side while in a flat bed without using bedrails?: A Little Help needed moving from lying on your back to sitting on the side of a flat bed without using bedrails?: A Little Help needed moving to and from a bed to a chair (including a wheelchair)?: A Little Help needed standing up from a chair using your arms (e.g., wheelchair or bedside chair)?: A Little Help needed to walk in hospital room?: A Little Help needed climbing 3-5 steps with a railing? : A Lot 6 Click Score: 17    End of Session   Activity Tolerance: Patient tolerated treatment well Patient left: in bed;with call bell/phone within reach Nurse Communication: Mobility status PT Visit Diagnosis: Other abnormalities of gait and mobility (R26.89);Muscle weakness (generalized) (M62.81);History of falling (Z91.81)    Time: 5462-7035 PT Time Calculation (min) (ACUTE ONLY): 22 min   Charges:   PT Evaluation $PT Eval Low Complexity: 1 Low          Arlyss Gandy,  PT, DPT Acute Rehabilitation Office [place] 05/26/2022, 2:16 PM

## 2022-05-26 NOTE — ED Notes (Signed)
Pt states she felt like she needed to pee. Purewick placed back on pt and she has not been able to pee yet.  MD messaged and made aware.

## 2022-05-26 NOTE — Progress Notes (Signed)
Pt. Arrived to unit alert and oriented X 4, no c/o pain,bed in lowset position call bell within reach

## 2022-05-26 NOTE — Assessment & Plan Note (Addendum)
Is on valproate and doxepin. During last admission for similar cause, psychiatric illness was presumed to play a role in her presentation. Unlikely psychiatric role at this time. However, doxepin could be causing anticholinergic effects causing urinary retention and hypotension. Psychiatry was involved in her care during her last hospitalization.  Patient is also highly somnolent in the mornings, soon after valproate administration.  Not appropriate to discontinue psychiatric medications at this time given the patient is stable on current regimen and has a history of manic episodes as recently as last year, but will recommend outpatient titration and/or replacement of valproate and doxepin. - f/u valproate level - Holding doxepin

## 2022-05-26 NOTE — ED Notes (Signed)
Transported to MRI

## 2022-05-27 DIAGNOSIS — M79672 Pain in left foot: Secondary | ICD-10-CM | POA: Insufficient documentation

## 2022-05-27 DIAGNOSIS — E119 Type 2 diabetes mellitus without complications: Secondary | ICD-10-CM | POA: Diagnosis not present

## 2022-05-27 DIAGNOSIS — R339 Retention of urine, unspecified: Secondary | ICD-10-CM

## 2022-05-27 DIAGNOSIS — R55 Syncope and collapse: Secondary | ICD-10-CM | POA: Diagnosis not present

## 2022-05-27 LAB — CBC WITH DIFFERENTIAL/PLATELET
Abs Immature Granulocytes: 0.01 10*3/uL (ref 0.00–0.07)
Basophils Absolute: 0 10*3/uL (ref 0.0–0.1)
Basophils Relative: 0 %
Eosinophils Absolute: 0.1 10*3/uL (ref 0.0–0.5)
Eosinophils Relative: 2 %
HCT: 30.6 % — ABNORMAL LOW (ref 36.0–46.0)
Hemoglobin: 10 g/dL — ABNORMAL LOW (ref 12.0–15.0)
Immature Granulocytes: 0 %
Lymphocytes Relative: 26 %
Lymphs Abs: 1.3 10*3/uL (ref 0.7–4.0)
MCH: 29.5 pg (ref 26.0–34.0)
MCHC: 32.7 g/dL (ref 30.0–36.0)
MCV: 90.3 fL (ref 80.0–100.0)
Monocytes Absolute: 1.3 10*3/uL — ABNORMAL HIGH (ref 0.1–1.0)
Monocytes Relative: 26 %
Neutro Abs: 2.3 10*3/uL (ref 1.7–7.7)
Neutrophils Relative %: 46 %
Platelets: 217 10*3/uL (ref 150–400)
RBC: 3.39 MIL/uL — ABNORMAL LOW (ref 3.87–5.11)
RDW: 14.7 % (ref 11.5–15.5)
WBC: 5.1 10*3/uL (ref 4.0–10.5)
nRBC: 0 % (ref 0.0–0.2)

## 2022-05-27 LAB — BASIC METABOLIC PANEL
Anion gap: 9 (ref 5–15)
BUN: 10 mg/dL (ref 6–20)
CO2: 26 mmol/L (ref 22–32)
Calcium: 8.3 mg/dL — ABNORMAL LOW (ref 8.9–10.3)
Chloride: 105 mmol/L (ref 98–111)
Creatinine, Ser: 0.94 mg/dL (ref 0.44–1.00)
GFR, Estimated: >60 mL/min (ref >60)
Glucose, Bld: 81 mg/dL (ref 70–99)
Potassium: 3.9 mmol/L (ref 3.5–5.1)
Sodium: 140 mmol/L (ref 135–145)

## 2022-05-27 LAB — GLUCOSE, CAPILLARY
Glucose-Capillary: 78 mg/dL (ref 70–99)
Glucose-Capillary: 78 mg/dL (ref 70–99)
Glucose-Capillary: 129 mg/dL — ABNORMAL HIGH (ref 70–99)
Glucose-Capillary: 98 mg/dL (ref 70–99)

## 2022-05-27 LAB — HIV ANTIBODY (ROUTINE TESTING W REFLEX): HIV Screen 4th Generation wRfx: NONREACTIVE

## 2022-05-27 MED ORDER — DIVALPROEX SODIUM ER 500 MG PO TB24
1000.0000 mg | ORAL_TABLET | Freq: Every day | ORAL | Status: DC
  Administered 2022-05-27: 1000 mg via ORAL
  Filled 2022-05-27: qty 2

## 2022-05-27 MED ORDER — IBUPROFEN 200 MG PO TABS
600.0000 mg | ORAL_TABLET | Freq: Four times a day (QID) | ORAL | Status: DC | PRN
  Administered 2022-05-27: 600 mg via ORAL
  Filled 2022-05-27: qty 3

## 2022-05-27 NOTE — Assessment & Plan Note (Addendum)
Patient's briefly complained of severe heel pain when getting up to the restroom overnight.  Treated with ibuprofen 600 mg and improved significantly.  Follow up outpatient.

## 2022-05-27 NOTE — Progress Notes (Signed)
Physical Therapy Treatment Patient Details Name: [name] MRN: [id number] DOB: 17-Jan-1966 Today's Date: 05/27/2022   History of Present Illness 57 y.o. female presents to [facility] hospital on 05/25/2022 after multiple syncopal episodes. PMH includes RA, migraines, OSA, vasovagal syncope, Graves' disease, vertigo, bipolar, chronic fatigue syndrome, HLD.    PT Comments    Pt received in supine, agreeable to therapy session with encouragement, pt limited due to L heel pain and c/o nerve pain type discomfort radiating into her foot. Pt agreeable to trial [facility] and reports she is unlikely to use RW at home due to it feeling too restrictive. Pt close Supervision level for gait trial short household distance with cane and min guard to Supervision for bed mobility. Pt would benefit from HHPT upon DC home as well as cane DME, case mgr and LCSW notified. DME and disposition updated per discussion with pt and supervising PT Gretel Acre.   Recommendations for follow up therapy are one component of a multi-disciplinary discharge planning process, led by the attending physician.  Recommendations may be updated based on patient status, additional functional criteria and insurance authorization.  Follow Up Recommendations  Home health PT (eventually interested in aquatic therapies after completing HHPT)     Assistance Recommended at Discharge Intermittent Supervision/Assistance  Patient can return home with the following A little help with walking and/or transfers;A little help with bathing/dressing/bathroom;Assistance with cooking/housework;Assist for transportation;Help with stairs or ramp for entrance   Equipment Recommendations  Cane    Recommendations for Other Services       Precautions / Restrictions Precautions Precautions: Fall Precaution Comments: L heel pain Restrictions Weight Bearing Restrictions: No     Mobility  Bed Mobility Overal bed mobility: Needs Assistance Bed Mobility: Supine to  Sit, Sit to Supine     Supine to sit: Modified independent (Device/Increase time) Sit to supine: Min guard   General bed mobility comments: cues for using RLE to assist with LLE given pt c/o L heel pain    Transfers Overall transfer level: Needs assistance Equipment used: Straight cane Transfers: Sit to/from Stand Sit to Stand: Supervision           General transfer comment: pushing from bed with LUE, holding cane with RUE    Ambulation/Gait Ambulation/Gait assistance: Supervision Gait Distance (Feet): 30 Feet Assistive device: Straight cane Gait Pattern/deviations: Step-through pattern, Antalgic Gait velocity: reduced Gait velocity interpretation: <1.8 ft/sec, indicate of risk for recurrent falls   General Gait Details: pt self-limiting distance due to c/o L heel and knee pain and nausea, RN notified pt requesting pain meds; no overt LOB using cane, pt reports she is more likely to use AD if she has cane at home rather than RW as she feels "RW will make me feel disabled". Reinforced use of RW for pain relief rather than for limiting her, but pt was agreeable to cane and did well with it.   Stairs Stairs:  (pt defers due to pain)           Wheelchair Mobility    Modified Rankin (Stroke Patients Only)       Balance Overall balance assessment: Needs assistance Sitting-balance support: No upper extremity supported, Feet supported Sitting balance-Leahy Scale: Good     Standing balance support: Single extremity supported, During functional activity Standing balance-Leahy Scale: Fair Standing balance comment: fair with cane  Cognition Arousal/Alertness: Awake/alert (Simultaneous filing. User may not have seen previous data.) Behavior During Therapy: Flat affect (Simultaneous filing. User may not have seen previous data.) Overall Cognitive Status: No family/caregiver present to determine baseline cognitive functioning  (Simultaneous filing. User may not have seen previous data.) Area of Impairment: Safety/judgement                         Safety/Judgement: Decreased awareness of safety     General Comments: min cues for safety and modification of body mechanics for improved pain relief/safety (Simultaneous filing. User may not have seen previous data.)        Exercises Other Exercises Other Exercises: seated LLE AROM: ankle pumps, LAQ, hip flexion x10-15 reps ea    General Comments General comments (skin integrity, edema, etc.): no acute s/sx distress other than pain, mild nausea but pt defers water or a snack. reviewed pillow use for elevation but to float heel, pt receptive      Pertinent Vitals/Pain Pain Assessment Faces Pain Scale: Hurts even more Pain Location: LLE (heel), also c/o L knee discomfort and feeling somewhat unstable Pain Intervention(s): Limited activity within patient's tolerance, Monitored during session, Repositioned, Other (comment), Ice applied, Patient requesting pain meds-RN notified (ice to her L heel, encouraged 10-20 mins on/off for 30 mins PRN)     PT Goals (current goals can now be found in the care plan section) Acute Rehab PT Goals Patient Stated Goal: to reduce pain and go home PT Goal Formulation: With patient Time For Goal Achievement: 06/09/22 Progress towards PT goals: Progressing toward goals    Frequency    Min 3X/week      PT Plan Discharge plan needs to be updated;Equipment recommendations need to be updated       AM-PAC PT "6 Clicks" Mobility   Outcome Measure  Help needed turning from your back to your side while in a flat bed without using bedrails?: None Help needed moving from lying on your back to sitting on the side of a flat bed without using bedrails?: A Little Help needed moving to and from a bed to a chair (including a wheelchair)?: A Little Help needed standing up from a chair using your arms (e.g., wheelchair or  bedside chair)?: A Little Help needed to walk in hospital room?: A Little Help needed climbing 3-5 steps with a railing? : A Lot 6 Click Score: 18    End of Session Equipment Utilized During Treatment: Gait belt Activity Tolerance: Patient tolerated treatment well;Patient limited by pain Patient left: in bed;with call bell/phone within reach;with bed alarm set;Other (comment) (bed alarm on for safety) Nurse Communication: Mobility status;Patient requests pain meds PT Visit Diagnosis: Other abnormalities of gait and mobility (R26.89);Muscle weakness (generalized) (M62.81);History of falling (Z91.81)     Time: 1547-1610 PT Time Calculation (min) (ACUTE ONLY): 23 min  Charges:  $Gait Training: 8-22 mins $Therapeutic Activity: 8-22 mins                     Hamilton Marinello P., PTA Acute Rehabilitation Services Secure Chat Preferred 9a-5:30pm Office: [place] 05/27/2022, 4:31 PM

## 2022-05-27 NOTE — Discharge Summary (Signed)
[facility] Discharge Summary  Patient name: [name] Medical record number: [id number] Date of birth: 12-05-1965 Age: 57 y.o. Gender: female Date of Admission: 05/25/2022  Date of Discharge: 05/27/2022 Admitting Physician: Tiffany Kocher, DO  Primary Care Provider: Iona Hansen, NP Consultants: Erick Blinks, MD (Neurology)  Indication for Hospitalization: Unwitnessed syncopal episode  Discharge Diagnoses/Problem List: Principal Problem for Admission: Syncope Other Problems addressed during stay: Principal Problem:   Syncope Active Problems:   Graves' disease   Bipolar disorder (HCC)   S/P gastric bypass   Type 2 diabetes mellitus without complications (HCC)   Hx of recurrent syncope   Anemia   Urinary retention   Pain of left heel  Brief Hospital Course:  [name] is a 57 y.o.female with a history of vasovagal syncope, T2DM, HTN, Bipolar disorder, graves disease, RA, and s/p gastric bypass in 2008 who was admitted to the [facility] at [facility] for syncope and generalized weakness. Her hospital course is detailed below:  Syncope Admitted after unwitnessed syncopal episode. CT and MRI scans of brain, pelvis, and abdomen, as well as xray survey of extremities and pelvis unremarkable for acute pathology. Neurology was consulted, did not think neurologic cause to be likely.  Patient previously diagnosed with vasovagal syncope by [facility] with tilt table test in 2017.  TSH, B12, and folate WNL.  Considered contribution of polypharmacy to patient's presentation given multiple medications with anticholinergic and anti-hypertensive effects.  Patient instructed to stop taking Zyrtec and hydrochlorothiazide at discharge.  Recommend tapering anticholinergic doxepin outpatient.  OT recommended discharge home with frequent assistance for ADLs as necessary.  Instructed patient not to drive or to swim unaccompanied along with other  safety precautions provided on discharge.  Urinary retention Patient noted to have low urinary output and bladder scan showed 816 mL of retained urine.  Patient required in and out catheterization. Patient was thereafter monitored with Q6H bladder scans and was able to spontaneously void normally with postvoid residuals of 0 mL each time.  Discontinued phentermine at discharge due to urinary retention concerns (patient last took phentermine 2 weeks ago but has refill ready at pharmacy).  Bipolar disorder Patient maintained on home valproate.  Doxepin held this admission in setting of possible adverse effects of anticholinergic medicines.  Restarted doxepin at discharge.  Recommend tapering doxepin outpatient in setting of likely vasovagal syncope.  Type 2 diabetes mellitus without complications (HCC) Held patient's Mounjaro in setting of syncope and hypotension.  Patient found to be hypoglycemic with CBG of 55 on hospital day 1, but increased appropriately with orange juice and food; CBGs remained normal afterwards.  Restarted Mounjaro at discharge.  Other chronic conditions were medically managed with home medications and formulary alternatives as necessary (i.e. gastric bypass, left heel pain)  PCP Follow-up Recommendations: Recommend tapering doxepin with psychiatry due to anticholinergic effects in setting of syncope and hypotension. We discontinued Zyrtec, flexeril, phentermine, and microzide d/t anticholinergic effects, syncope and hypotension Follow-up with [facility] to discuss recurrent vasovagal syncope and hypotension, previously seen by them repeatedly and diagnosed with vasovagal syncope in 2017 by tilt table test. Use of abdominal binder recommended to patient to assist with hypotension when standing. Physical therapy recommends outpatient PT with aquatic therapy. Neuro recommends outpatient EMG QSART to evaluate for small fiber neuropathy with autonomic  dysfunction.  Disposition: Home  Discharge Condition: Stable  Issues for Follow Up:  Recommend tapering doxepin with psychiatry due to anticholinergic effects in setting  of syncope and hypotension. Follow-up with [facility] to discuss recurrent vasovagal syncope and hypotension, previously seen by them repeatedly and diagnosed with vasovagal syncope in 2017 by tilt table test. Use of abdominal binder recommended to patient to assist with hypotension when standing. Physical therapy recommends outpatient PT with aquatic therapy. Neuro recommends outpatient EMG QSART to evaluate for small fiber neuropathy with autonomic dysfunction.  Discharge Exam:  Vitals:   05/27/22 0827 05/27/22 1435  BP: 115/69 (!) 102/51  Pulse: 81 83  Resp: 18 16  Temp: 98 F (36.7 C) 98.7 F (37.1 C)  SpO2: 97% 99%   General:  Well, but tired-appearing.  In no acute distress. Cardiovascular:  Normal S1/S2.  No murmurs/rubs/gallops.  Radial pulses 2+ bilaterally. Respiratory:  Clear to auscultation bilaterally in all lung fields.  Normal WOB on room air. Abdomen:  No TTP in all quadrants.  No rebound or guarding. Normoactive bowel sounds. Extremities:  No lesions, rashes, or swelling, including on left sole.  Trace pedal edema bilaterally.  Capillary refill <2 seconds on hands and toes. MSK:  Mild TTP over left heel and arch of foot.  Significant Procedures: None  Significant Labs and Imaging:   Recent Labs  Lab 05/26/22 1219 05/27/22 0359  NA 141 140  K 3.2* 3.9  CL 107 105  CO2 25 26  GLUCOSE 55* 81  BUN 15 10  CREATININE 0.93 0.94  CALCIUM 8.2* 8.3*   WBC 6.0 5.1  HGB 10.6* 10.0*  HCT 33.3* 30.6*  PLT 220 217   Pertinent Imaging - MR brain w/ & w/o contrast: No acute intracranial abnormality. - CT abdomen, pelvis w/o contrast: No acute abnormality. - CT cervical spine w/o contrast: No acute abnormality. - CT head w/o contrast: No acute abnormality. - DG chest, DG pelvis, DG knee  L+R, DG elbow right, DG humerus left: No osseous abnormality.  Results/Tests Pending at Time of Discharge: - Valproic acid level  Discharge Medications:  Allergies as of 05/27/2022       Reactions   Bee Venom Anaphylaxis   Other reaction(s): PRURITUS, RASH   Coconut Fatty Acids Anaphylaxis   Patient allergic to coconut in general   Mushroom Ext Cmplx(shiitake-reishi-mait) Anaphylaxis   Nutritional Supplements Anaphylaxis, Itching   walnuts   Other Anaphylaxis, Hives, Itching   Ragweed   Shellfish Allergy Anaphylaxis   Strawberry Extract Anaphylaxis   Hydrocodone-acetaminophen Itching   Latex    Molds & Smuts    Hydrocodone-acetaminophen Rash   Other reaction(s): Unknown Other reaction(s): Other (See Comments)        Medication List     STOP taking these medications    cetirizine 10 MG tablet Commonly known as: ZYRTEC   cyclobenzaprine 10 MG tablet Commonly known as: FLEXERIL   hydrochlorothiazide 12.5 MG capsule Commonly known as: MICROZIDE   meloxicam 15 MG tablet Commonly known as: MOBIC   Movantik 25 MG Tabs tablet Generic drug: naloxegol oxalate   phentermine 15 MG capsule   Trulicity 3 QM/5.7QI Sopn Generic drug: Dulaglutide   Vitamin D (Ergocalciferol) 1.25 MG (50000 UNIT) Caps capsule Commonly known as: DRISDOL       TAKE these medications    albuterol 108 (90 Base) MCG/ACT inhaler Commonly known as: VENTOLIN HFA Inhale 2 puffs into the lungs every 6 (six) hours as needed for wheezing.   azelastine 0.1 % nasal spray Commonly known as: ASTELIN Place 2 sprays into both nostrils in the morning. Use in each nostril as directed   bisacodyl  10 MG suppository Commonly known as: Dulcolax Place 1 suppository (10 mg total) rectally as needed for moderate constipation.   budesonide-formoterol 80-4.5 MCG/ACT inhaler Commonly known as: SYMBICORT Inhale 2 puffs into the lungs 2 (two) times daily as needed (wheezing/sob).   diclofenac sodium 1 %  Gel Commonly known as: VOLTAREN Apply 2 g topically 4 (four) times daily.   divalproex 500 MG 24 hr tablet Commonly known as: DEPAKOTE ER Take 2 tablets (1,000 mg total) by mouth daily.   doxepin 150 MG capsule Commonly known as: SINEQUAN Take 1 capsule (150 mg total) by mouth at bedtime. What changed: how much to take   Emgality 120 MG/ML Soaj Generic drug: Galcanezumab-gnlm INJECT 120 MG INTO THE SKIN EVERY 30 DAYS   estradiol 0.5 MG tablet Commonly known as: ESTRACE Take 0.5 mg by mouth daily.   fluticasone 50 MCG/ACT nasal spray Commonly known as: FLONASE PLACE 1 SPRAY INTO BOTH NOSTRILS AS NEEDED (ALLERGIES/RUNNY NOSE).   ibuprofen 800 MG tablet Commonly known as: ADVIL Take 1 tablet (800 mg total) by mouth every 8 (eight) hours as needed (pain).   linaclotide 290 MCG Caps capsule Commonly known as: Linzess Take 1 capsule (290 mcg total) by mouth daily before breakfast.   Mounjaro 10 MG/0.5ML Pen Generic drug: tirzepatide Inject 10 mg into the skin once a week.   multivitamin tablet Take 1 tablet by mouth daily.   polyethylene glycol 17 g packet Commonly known as: MiraLax Take 17 g by mouth daily.   tapentadol HCl 75 MG tablet Commonly known as: NUCYNTA Take 75 mg by mouth in the morning, at noon, and at bedtime. -makes her feel itchy, takes once nightly      Discharge Instructions: Please refer to Patient Instructions section of EMR for full details.  Patient was counseled important signs and symptoms that should prompt return to medical care, changes in medications, dietary instructions, activity restrictions, and follow up appointments.  Follow-Up Appointments:  Follow-up Information     Berkley Harvey, NP. Schedule an appointment as soon as possible for a visit in 1 week(s).   Specialty: Nurse Practitioner Why: Follow up from ER visit Contact information: [address] [place] [address] [employer]. Go to .   Specialty: Emergency Medicine Why: As needed, If symptoms worsen Contact information: [address] [place] [address] [id number] [name]        Berkley Harvey, NP .   Specialty: Nurse Practitioner Contact information: [address] [place] [address] [place], [place], DO 05/27/2022, 3:58 PM PGY-2, [place]

## 2022-05-27 NOTE — Progress Notes (Signed)
Occupational Therapy Treatment Patient Details Name: [name] MRN: [id number] DOB: Mar 10, 1966 Today's Date: 05/27/2022   History of present illness 57 y.o. female presents to [facility] hospital on 05/25/2022 after multiple syncopal episodes. PMH includes RA, migraines, OSA, vasovagal syncope, Graves' disease, vertigo, bipolar, chronic fatigue syndrome, HLD.   OT comments  Pt greeted supine in bed, pt agreeable to OT intervention. Session focus on BADL reeducation, functional mobility, dynamic standing balance, AE education and decreasing overall caregiver burden.  Pt currently requires min guard for ADL transfers with no AD, MOD A for LB ADLS and supervision for standing grooming tasks. Full education and demo provided on all LB AE as pt with pain when reaching to LB. Pt with questions about getting off the floor at home if she were to fall as well as tub transfers, feel pt would greatly benefit from [facility] to decrease risk of falls as well as risk of readmission.          Therefore, updated DC plan as indicated below. Pt would continue to benefit from skilled occupational therapy while admitted and after d/c to address the below listed limitations in order to improve overall functional mobility and facilitate independence with BADL participation. DC plan remains appropriate, will follow acutely per POC.              Recommendations for follow up therapy are one component of a multi-disciplinary discharge planning process, led by the attending physician.  Recommendations may be updated based on patient status, additional functional criteria and insurance authorization.    Follow Up Recommendations  Home health OT     Assistance Recommended at Discharge Frequent or constant Supervision/Assistance  Patient can return home with the following  A little help with walking and/or transfers;A little help with bathing/dressing/bathroom;Assistance with cooking/housework;Assistance with feeding;Help with stairs or  ramp for entrance;Assist for transportation;Direct supervision/assist for financial management;Direct supervision/assist for medications management   Equipment Recommendations  None recommended by OT    Recommendations for Other Services      Precautions / Restrictions Precautions Precautions: Fall Precaution Comments: L heel pain Restrictions Weight Bearing Restrictions: No       Mobility Bed Mobility Overal bed mobility: Needs Assistance Bed Mobility: Supine to Sit, Sit to Supine     Supine to sit: Modified independent (Device/Increase time) Sit to supine: Modified independent (Device/Increase time)        Transfers Overall transfer level: Needs assistance Equipment used: None Transfers: Sit to/from Stand Sit to Stand: Supervision           General transfer comment: supervision for safety, slow and steady stand needign initial close min gaurd for stand     Balance Overall balance assessment: Needs assistance Sitting-balance support: No upper extremity supported, Feet supported Sitting balance-Leahy Scale: Good     Standing balance support: Single extremity supported, During functional activity Standing balance-Leahy Scale: Fair                             ADL either performed or assessed with clinical judgement   ADL Overall ADL's : Needs assistance/impaired     Grooming: Wash/dry hands;Standing;Supervision/safety         Lower Body Bathing Details (indicate cue type and reason): education provided on AE for LB bathing     Lower Body Dressing: Moderate assistance;Cueing for safety;Cueing for sequencing;With adaptive equipment Lower Body Dressing Details (indicate cue type and reason): usign sock aid Toilet Transfer: Min  guard;Ambulation Toilet Transfer Details (indicate cue type and reason): no AD Toileting- Clothing Manipulation and Hygiene: Supervision/safety;Sit to/from Nurse, children's Details (indicate cue type and  reason): pt reports garden tub at home, pt would benefit from [facility] to assess home tub set- up Functional mobility during ADLs: Min guard General ADL Comments: ADl participation impacted by decreased activity tolerance and increased pain    Extremity/Trunk Assessment Upper Extremity Assessment Upper Extremity Assessment: Overall WFL for tasks assessed   Lower Extremity Assessment Lower Extremity Assessment: Defer to PT evaluation        Vision Patient Visual Report: No change from baseline     Perception Perception Perception: Within Functional Limits   Praxis Praxis Praxis: Intact    Cognition                                                Exercises      Shoulder Instructions       General Comments full education provided on all LB AE as well as floor transfers as pt asking how to get up from floor is she falls again    Pertinent Vitals/ Pain       Pain Assessment Pain Assessment: Faces Pain Descriptors / Indicators: Grimacing  Home Living                                          Prior Functioning/Environment              Frequency  Min 2X/week        Progress Toward Goals  OT Goals(current goals can now be found in the care plan section)  Progress towards OT goals: Progressing toward goals  Acute Rehab OT Goals Patient Stated Goal: to go home OT Goal Formulation: With patient Time For Goal Achievement: 06/09/22 Potential to Achieve Goals: Good  Plan Discharge plan needs to be updated;Frequency needs to be updated    Co-evaluation                 AM-PAC OT "6 Clicks" Daily Activity     Outcome Measure   Help from another person eating meals?: None Help from another person taking care of personal grooming?: None Help from another person toileting, which includes using toliet, bedpan, or urinal?: A Little Help from another person bathing (including washing, rinsing, drying)?: A Little Help from  another person to put on and taking off regular upper body clothing?: None Help from another person to put on and taking off regular lower body clothing?: A Little 6 Click Score: 21    End of Session    OT Visit Diagnosis: Other abnormalities of gait and mobility (R26.89);Muscle weakness (generalized) (M62.81);Other symptoms and signs involving cognitive function   Activity Tolerance Patient tolerated treatment well   Patient Left in bed;with call bell/phone within reach   Nurse Communication Mobility status        Time: 4166-0630 OT Time Calculation (min): 26 min  Charges: OT General Charges $OT Visit: 1 Visit OT Treatments $Self Care/Home Management : 23-37 mins  Harley Alto., COTA/L Acute Rehabilitation Services [phone]   Precious Haws 05/27/2022, 4:32 PM

## 2022-05-27 NOTE — Progress Notes (Signed)
OT Cancellation Note  Patient Details Name: [name] MRN: [id number] DOB: [birth year]   Cancelled Treatment:    Reason Eval/Treat Not Completed: Fatigue/lethargy limiting ability to participate pt reports fatigue from not sleeping, will check back as time allows for OT session.   Harley Alto., COTA/L Acute Rehabilitation Services [phone]   Precious Haws 05/27/2022, 8:25 AM

## 2022-05-27 NOTE — TOC Transition Note (Signed)
Transition of Care [facility]) - CM/SW Discharge Note   Patient Details  Name: [name] MRN: [id number] Date of Birth: 04/04/1966  Transition [facility] CM/SW Contact:  Levonne Lapping, RN Phone Number: 05/27/2022, 4:52 PM   Clinical Narrative:    Patient to DC to home this afternoon. A cane has been provided by [employer]. Patient states she has had HH previously and has the name of the company she used at home.  She did not want to look at the list provided but preferred to contact [facility] when she gets home to let them know she was discharged from hospital and is being recommended [facility].  CM reassured patient that their liaison is sure to call Case Management or reference her chart in EPIC for order etc. Patient's Son to transport home.    No additional TOC needs.    Final next level of care: Home w Home Health Services Barriers to Discharge: No Barriers Identified   Patient Goals and CMS Choice CMS Medicare.gov Compare Post Acute Care list provided to:: Patient Choice offered to / list presented to : Patient  Discharge Placement                         Discharge Plan and Services Additional resources added to the After Visit Summary for                  DME Arranged: Kasandra Knudsen DME Agency: [employer] Date DME Agency Contacted: 05/27/22 Time DME Agency Contacted: 1540 Representative spoke with at DME Agency: Brenton Grills HH Arranged: PT Angie: NA (TBD  Patient has had HH previously and told me she had their information at home.  She will contact them directly from home and then [facility] can follow up)        Social Determinants of Health (SDOH) Interventions [place]: Low Risk  (05/26/2022)  Depression (PHQ2-9): High Risk (05/20/2022)  Tobacco Use: Low Risk  (05/26/2022)     Readmission Risk Interventions     No data to display

## 2022-06-01 ENCOUNTER — Inpatient Hospital Stay (HOSPITAL_COMMUNITY)
Admission: EM | Admit: 2022-06-01 | Discharge: 2022-06-04 | DRG: 176 | Disposition: A | Discharge status: Home Health | Payer: 59 | Attending: Family Medicine | Admitting: Family Medicine

## 2022-06-01 ENCOUNTER — Other Ambulatory Visit: Payer: Self-pay

## 2022-06-01 ENCOUNTER — Emergency Department (HOSPITAL_COMMUNITY): Payer: 59

## 2022-06-01 ENCOUNTER — Encounter (HOSPITAL_COMMUNITY): Payer: Self-pay

## 2022-06-01 DIAGNOSIS — Z813 Family history of other psychoactive substance abuse and dependence: Secondary | ICD-10-CM

## 2022-06-01 DIAGNOSIS — W19XXXA Unspecified fall, initial encounter: Secondary | ICD-10-CM | POA: Diagnosis present

## 2022-06-01 DIAGNOSIS — X58XXXA Exposure to other specified factors, initial encounter: Secondary | ICD-10-CM | POA: Diagnosis present

## 2022-06-01 DIAGNOSIS — I10 Essential (primary) hypertension: Secondary | ICD-10-CM | POA: Diagnosis present

## 2022-06-01 DIAGNOSIS — R52 Pain, unspecified: Secondary | ICD-10-CM | POA: Diagnosis not present

## 2022-06-01 DIAGNOSIS — M069 Rheumatoid arthritis, unspecified: Secondary | ICD-10-CM | POA: Diagnosis present

## 2022-06-01 DIAGNOSIS — Z8616 Personal history of COVID-19: Secondary | ICD-10-CM | POA: Diagnosis not present

## 2022-06-01 DIAGNOSIS — E0500 Thyrotoxicosis with diffuse goiter without thyrotoxic crisis or storm: Secondary | ICD-10-CM | POA: Diagnosis present

## 2022-06-01 DIAGNOSIS — M25572 Pain in left ankle and joints of left foot: Secondary | ICD-10-CM

## 2022-06-01 DIAGNOSIS — Z79899 Other long term (current) drug therapy: Secondary | ICD-10-CM

## 2022-06-01 DIAGNOSIS — Z811 Family history of alcohol abuse and dependence: Secondary | ICD-10-CM

## 2022-06-01 DIAGNOSIS — Z91030 Bee allergy status: Secondary | ICD-10-CM

## 2022-06-01 DIAGNOSIS — M810 Age-related osteoporosis without current pathological fracture: Secondary | ICD-10-CM | POA: Diagnosis present

## 2022-06-01 DIAGNOSIS — Z800 Family history of malignant neoplasm of digestive organs: Secondary | ICD-10-CM

## 2022-06-01 DIAGNOSIS — Z86718 Personal history of other venous thrombosis and embolism: Secondary | ICD-10-CM

## 2022-06-01 DIAGNOSIS — Z818 Family history of other mental and behavioral disorders: Secondary | ICD-10-CM | POA: Diagnosis not present

## 2022-06-01 DIAGNOSIS — Z803 Family history of malignant neoplasm of breast: Secondary | ICD-10-CM

## 2022-06-01 DIAGNOSIS — E2740 Unspecified adrenocortical insufficiency: Secondary | ICD-10-CM | POA: Diagnosis present

## 2022-06-01 DIAGNOSIS — Z8249 Family history of ischemic heart disease and other diseases of the circulatory system: Secondary | ICD-10-CM | POA: Diagnosis not present

## 2022-06-01 DIAGNOSIS — M79672 Pain in left foot: Secondary | ICD-10-CM | POA: Diagnosis present

## 2022-06-01 DIAGNOSIS — R55 Syncope and collapse: Principal | ICD-10-CM

## 2022-06-01 DIAGNOSIS — Z91040 Latex allergy status: Secondary | ICD-10-CM

## 2022-06-01 DIAGNOSIS — F419 Anxiety disorder, unspecified: Secondary | ICD-10-CM | POA: Diagnosis present

## 2022-06-01 DIAGNOSIS — Z823 Family history of stroke: Secondary | ICD-10-CM | POA: Diagnosis not present

## 2022-06-01 DIAGNOSIS — Z8261 Family history of arthritis: Secondary | ICD-10-CM | POA: Diagnosis not present

## 2022-06-01 DIAGNOSIS — Z9109 Other allergy status, other than to drugs and biological substances: Secondary | ICD-10-CM

## 2022-06-01 DIAGNOSIS — Z1152 Encounter for screening for COVID-19: Secondary | ICD-10-CM | POA: Diagnosis not present

## 2022-06-01 DIAGNOSIS — I2699 Other pulmonary embolism without acute cor pulmonale: Secondary | ICD-10-CM | POA: Diagnosis present

## 2022-06-01 DIAGNOSIS — I2609 Other pulmonary embolism with acute cor pulmonale: Secondary | ICD-10-CM | POA: Diagnosis not present

## 2022-06-01 DIAGNOSIS — Z91013 Allergy to seafood: Secondary | ICD-10-CM

## 2022-06-01 DIAGNOSIS — Z9884 Bariatric surgery status: Secondary | ICD-10-CM | POA: Diagnosis not present

## 2022-06-01 DIAGNOSIS — Z83438 Family history of other disorder of lipoprotein metabolism and other lipidemia: Secondary | ICD-10-CM

## 2022-06-01 DIAGNOSIS — M5440 Lumbago with sciatica, unspecified side: Secondary | ICD-10-CM | POA: Diagnosis present

## 2022-06-01 DIAGNOSIS — M7989 Other specified soft tissue disorders: Secondary | ICD-10-CM

## 2022-06-01 DIAGNOSIS — E669 Obesity, unspecified: Secondary | ICD-10-CM | POA: Diagnosis present

## 2022-06-01 DIAGNOSIS — E119 Type 2 diabetes mellitus without complications: Secondary | ICD-10-CM

## 2022-06-01 DIAGNOSIS — G4733 Obstructive sleep apnea (adult) (pediatric): Secondary | ICD-10-CM | POA: Diagnosis present

## 2022-06-01 DIAGNOSIS — Z86711 Personal history of pulmonary embolism: Secondary | ICD-10-CM | POA: Insufficient documentation

## 2022-06-01 DIAGNOSIS — J309 Allergic rhinitis, unspecified: Secondary | ICD-10-CM

## 2022-06-01 DIAGNOSIS — F319 Bipolar disorder, unspecified: Secondary | ICD-10-CM | POA: Diagnosis present

## 2022-06-01 DIAGNOSIS — Z886 Allergy status to analgesic agent status: Secondary | ICD-10-CM

## 2022-06-01 DIAGNOSIS — Z833 Family history of diabetes mellitus: Secondary | ICD-10-CM

## 2022-06-01 DIAGNOSIS — Z825 Family history of asthma and other chronic lower respiratory diseases: Secondary | ICD-10-CM

## 2022-06-01 DIAGNOSIS — Z7951 Long term (current) use of inhaled steroids: Secondary | ICD-10-CM

## 2022-06-01 DIAGNOSIS — Z91018 Allergy to other foods: Secondary | ICD-10-CM

## 2022-06-01 DIAGNOSIS — Z8349 Family history of other endocrine, nutritional and metabolic diseases: Secondary | ICD-10-CM

## 2022-06-01 DIAGNOSIS — Z6833 Body mass index (BMI) 33.0-33.9, adult: Secondary | ICD-10-CM

## 2022-06-01 LAB — URINALYSIS, ROUTINE W REFLEX MICROSCOPIC
Bilirubin Urine: NEGATIVE
Glucose, UA: NEGATIVE mg/dL
Hgb urine dipstick: NEGATIVE
Ketones, ur: NEGATIVE mg/dL
Leukocytes,Ua: NEGATIVE
Nitrite: NEGATIVE
Protein, ur: NEGATIVE mg/dL
Specific Gravity, Urine: 1.026 (ref 1.005–1.030)
pH: 5 (ref 5.0–8.0)

## 2022-06-01 LAB — RESP PANEL BY RT-PCR (RSV, FLU A&B, COVID)  RVPGX2
Influenza A by PCR: NEGATIVE
Influenza B by PCR: NEGATIVE
Resp Syncytial Virus by PCR: NEGATIVE
SARS Coronavirus 2 by RT PCR: NEGATIVE

## 2022-06-01 LAB — CBC WITH DIFFERENTIAL/PLATELET
Abs Immature Granulocytes: 0.04 10*3/uL (ref 0.00–0.07)
Basophils Absolute: 0 10*3/uL (ref 0.0–0.1)
Basophils Relative: 0 %
Eosinophils Absolute: 0.1 10*3/uL (ref 0.0–0.5)
Eosinophils Relative: 1 %
HCT: 37.3 % (ref 36.0–46.0)
Hemoglobin: 11.8 g/dL — ABNORMAL LOW (ref 12.0–15.0)
Immature Granulocytes: 1 %
Lymphocytes Relative: 17 %
Lymphs Abs: 1.1 10*3/uL (ref 0.7–4.0)
MCH: 29.1 pg (ref 26.0–34.0)
MCHC: 31.6 g/dL (ref 30.0–36.0)
MCV: 92.1 fL (ref 80.0–100.0)
Monocytes Absolute: 1.3 10*3/uL — ABNORMAL HIGH (ref 0.1–1.0)
Monocytes Relative: 19 %
Neutro Abs: 4.2 10*3/uL (ref 1.7–7.7)
Neutrophils Relative %: 62 %
Platelets: 218 10*3/uL (ref 150–400)
RBC: 4.05 MIL/uL (ref 3.87–5.11)
RDW: 14.8 % (ref 11.5–15.5)
WBC: 6.8 10*3/uL (ref 4.0–10.5)
nRBC: 0 % (ref 0.0–0.2)

## 2022-06-01 LAB — COMPREHENSIVE METABOLIC PANEL
ALT: 13 U/L (ref 0–44)
AST: 19 U/L (ref 15–41)
Albumin: 3.1 g/dL — ABNORMAL LOW (ref 3.5–5.0)
Alkaline Phosphatase: 57 U/L (ref 38–126)
Anion gap: 9 (ref 5–15)
BUN: 20 mg/dL (ref 6–20)
CO2: 25 mmol/L (ref 22–32)
Calcium: 8.7 mg/dL — ABNORMAL LOW (ref 8.9–10.3)
Chloride: 103 mmol/L (ref 98–111)
Creatinine, Ser: 1.09 mg/dL — ABNORMAL HIGH (ref 0.44–1.00)
GFR, Estimated: 59 mL/min — ABNORMAL LOW (ref >60)
Glucose, Bld: 123 mg/dL — ABNORMAL HIGH (ref 70–99)
Potassium: 3.9 mmol/L (ref 3.5–5.1)
Sodium: 137 mmol/L (ref 135–145)
Total Bilirubin: 0.3 mg/dL (ref 0.3–1.2)
Total Protein: 6.7 g/dL (ref 6.5–8.1)

## 2022-06-01 LAB — CBC
HCT: 34.1 % — ABNORMAL LOW (ref 36.0–46.0)
Hemoglobin: 11 g/dL — ABNORMAL LOW (ref 12.0–15.0)
MCH: 29.3 pg (ref 26.0–34.0)
MCHC: 32.3 g/dL (ref 30.0–36.0)
MCV: 90.9 fL (ref 80.0–100.0)
Platelets: 207 10*3/uL (ref 150–400)
RBC: 3.75 MIL/uL — ABNORMAL LOW (ref 3.87–5.11)
RDW: 14.7 % (ref 11.5–15.5)
WBC: 6.9 10*3/uL (ref 4.0–10.5)
nRBC: 0 % (ref 0.0–0.2)

## 2022-06-01 LAB — BRAIN NATRIURETIC PEPTIDE: B Natriuretic Peptide: 12.1 pg/mL (ref 0.0–100.0)

## 2022-06-01 LAB — LIPASE, BLOOD: Lipase: 26 U/L (ref 11–51)

## 2022-06-01 LAB — MAGNESIUM: Magnesium: 2.2 mg/dL (ref 1.7–2.4)

## 2022-06-01 LAB — TROPONIN I (HIGH SENSITIVITY)
Troponin I (High Sensitivity): 5 ng/L (ref <18)
Troponin I (High Sensitivity): 3 ng/L (ref <18)

## 2022-06-01 LAB — LACTIC ACID, PLASMA: Lactic Acid, Venous: 1 mmol/L (ref 0.5–1.9)

## 2022-06-01 LAB — TSH: TSH: 4.687 u[IU]/mL — ABNORMAL HIGH (ref 0.350–4.500)

## 2022-06-01 MED ORDER — LINACLOTIDE 145 MCG PO CAPS
290.0000 ug | ORAL_CAPSULE | Freq: Every day | ORAL | Status: DC
  Administered 2022-06-04: 290 ug via ORAL
  Filled 2022-06-01 (×4): qty 2

## 2022-06-01 MED ORDER — HEPARIN BOLUS VIA INFUSION
4000.0000 [IU] | Freq: Once | INTRAVENOUS | Status: AC
  Administered 2022-06-01: 4000 [IU] via INTRAVENOUS
  Filled 2022-06-01: qty 4000

## 2022-06-01 MED ORDER — IBUPROFEN 800 MG PO TABS
800.0000 mg | ORAL_TABLET | Freq: Three times a day (TID) | ORAL | Status: DC | PRN
  Administered 2022-06-01: 800 mg via ORAL
  Filled 2022-06-01: qty 1

## 2022-06-01 MED ORDER — AZELASTINE HCL 0.1 % NA SOLN
2.0000 | Freq: Every morning | NASAL | Status: DC
  Administered 2022-06-02 – 2022-06-04 (×3): 2 via NASAL
  Filled 2022-06-01 (×2): qty 30

## 2022-06-01 MED ORDER — DOXEPIN HCL 75 MG PO CAPS: 150.0000 mg | ORAL_CAPSULE | Freq: Every day | ORAL | Status: DC

## 2022-06-01 MED ORDER — SODIUM CHLORIDE 0.9 % IV BOLUS
1000.0000 mL | Freq: Once | INTRAVENOUS | Status: AC
  Administered 2022-06-01: 1000 mL via INTRAVENOUS

## 2022-06-01 MED ORDER — TAPENTADOL HCL 50 MG PO TABS
75.0000 mg | ORAL_TABLET | ORAL | Status: DC | PRN
  Administered 2022-06-01 – 2022-06-02 (×2): 75 mg via ORAL
  Filled 2022-06-01: qty 2
  Filled 2022-06-01: qty 1
  Filled 2022-06-01: qty 2

## 2022-06-01 MED ORDER — HEPARIN (PORCINE) 25000 UT/250ML-% IV SOLN
1200.0000 [IU]/h | INTRAVENOUS | Status: DC
  Administered 2022-06-01: 1400 [IU]/h via INTRAVENOUS
  Filled 2022-06-01: qty 250

## 2022-06-01 MED ORDER — POLYETHYLENE GLYCOL 3350 17 G PO PACK
17.0000 g | PACK | Freq: Every day | ORAL | Status: DC
  Administered 2022-06-03: 17 g via ORAL
  Filled 2022-06-01 (×2): qty 1

## 2022-06-01 MED ORDER — SODIUM CHLORIDE 0.9 % IV BOLUS
500.0000 mL | Freq: Once | INTRAVENOUS | Status: AC
  Administered 2022-06-01: 500 mL via INTRAVENOUS

## 2022-06-01 MED ORDER — FLUTICASONE PROPIONATE 50 MCG/ACT NA SUSP: 1.0000 | Freq: Every day | NASAL | Status: DC | PRN

## 2022-06-01 MED ORDER — BISACODYL 10 MG RE SUPP: 10.0000 mg | Freq: Every day | RECTAL | Status: DC | PRN

## 2022-06-01 MED ORDER — IOHEXOL 350 MG/ML SOLN
75.0000 mL | Freq: Once | INTRAVENOUS | Status: AC | PRN
  Administered 2022-06-01: 75 mL via INTRAVENOUS

## 2022-06-01 MED ORDER — DIVALPROEX SODIUM ER 500 MG PO TB24
1000.0000 mg | ORAL_TABLET | Freq: Every day | ORAL | Status: DC
  Filled 2022-06-01: qty 2

## 2022-06-01 MED ORDER — MELATONIN 3 MG PO TABS
3.0000 mg | ORAL_TABLET | Freq: Every evening | ORAL | Status: DC | PRN
  Administered 2022-06-01 – 2022-06-02 (×2): 3 mg via ORAL
  Filled 2022-06-01 (×3): qty 1

## 2022-06-01 NOTE — Assessment & Plan Note (Signed)
Patient w/ hx of graves, s/p radioactive iodine therapy. TSH slightly elevated on admission, to 4.687. Unsure etiology of elevation, may be in setting of recent illness. Will recheck outpatient.

## 2022-06-01 NOTE — ED Provider Triage Note (Signed)
Emergency Medicine Provider Triage Evaluation Note  [name] , a 57 y.o. female  was evaluated in triage.  Pt complains of syncope  three times. Pt reports she has had a cough and fever today.  Pt had a fall a week ago.   Review of Systems  Positive: fever Negative: headache  Physical Exam  BP 91/63 (BP Location: Right Arm)   Pulse 94   Temp 98.6 F (37 C)   Resp 20   Ht 5\' 6"  (1.676 m)   Wt 94.8 kg   SpO2 95%   BMI 33.73 kg/m  Gen:   Awake, no distress   Resp:  Normal effort  MSK:   Moves extremities without difficulty  Other:    Medical Decision Making  Medically screening exam initiated at 12:49 PM.  Appropriate orders placed.  [name] was informed that the remainder of the evaluation will be completed by another provider, this initial triage assessment does not replace that evaluation, and the importance of remaining in the ED until their evaluation is complete.     [name], [place] 06/01/22 1254

## 2022-06-01 NOTE — Progress Notes (Addendum)
FMTS Interim Progress Note  Night rounded w/ Dr. Nancy Fetter. Pt resting comfortably in bed on RA. VSS. Speaking in full sentences and responding appropriately. Son at bedside. Pt asking for something for sleep, she takes dozepin at home but reports that it is overly sedating.  - Melatonin prn for sleep - Plan per day team  Arlyce Dice, MD 06/01/2022, 7:55 PM PGY-1, [place] [facility] Service pager [phone]

## 2022-06-01 NOTE — ED Provider Notes (Signed)
[facility] EMERGENCY DEPARTMENT Provider Note   CSN: [id number] Arrival date & time: 06/01/22  1222     History  Chief Complaint  Patient presents with   synope    [name] is a 57 y.o. female.  The history is provided by the patient and medical records. No language interpreter was used.  Loss of Consciousness Episode history:  Single Most recent episode:  2 days ago Timing:  Intermittent Progression:  Waxing and waning Chronicity:  Recurrent Relieved by:  Nothing Worsened by:  Nothing Ineffective treatments:  None tried Associated symptoms: chest pain, fever, malaise/fatigue, nausea, palpitations, shortness of breath and vomiting   Associated symptoms: no confusion, no headaches and no weakness        Home Medications Prior to Admission medications   Medication Sig Start Date End Date Taking? Authorizing Provider  albuterol (VENTOLIN HFA) 108 (90 Base) MCG/ACT inhaler Inhale 2 puffs into the lungs every 6 (six) hours as needed for wheezing. 12/20/19   Whitmire, Joneen Boers, FNP  azelastine (ASTELIN) 0.1 % nasal spray Place 2 sprays into both nostrils in the morning. Use in each nostril as directed    [provider]  bisacodyl (DULCOLAX) 10 MG suppository Place 1 suppository (10 mg total) rectally as needed for moderate constipation. 01/25/22   Maudie Flakes, MD  budesonide-formoterol (SYMBICORT) 80-4.5 MCG/ACT inhaler Inhale 2 puffs into the lungs 2 (two) times daily as needed (wheezing/sob).  Patient not taking: Reported on 05/26/2022    [provider]  diclofenac sodium (VOLTAREN) 1 % GEL Apply 2 g topically 4 (four) times daily. 12/12/18   Trula Slade, DPM  divalproex (DEPAKOTE ER) 500 MG 24 hr tablet Take 2 tablets (1,000 mg total) by mouth daily. 05/20/22   Charlcie Cradle, MD  doxepin (SINEQUAN) 150 MG capsule Take 1 capsule (150 mg total) by mouth at bedtime. Patient taking differently: Take 100 mg by mouth at bedtime.  05/20/22   Charlcie Cradle, MD  estradiol (ESTRACE) 0.5 MG tablet Take 0.5 mg by mouth daily. 07/20/19   [provider]  fluticasone (FLONASE) 50 MCG/ACT nasal spray PLACE 1 SPRAY INTO BOTH NOSTRILS AS NEEDED (ALLERGIES/RUNNY NOSE). 10/22/20   Whitmire, Dawn W, FNP  Galcanezumab-gnlm (EMGALITY) 120 MG/ML SOAJ INJECT 120 MG INTO THE SKIN EVERY 30 DAYS 04/21/21   Melvenia Beam, MD  ibuprofen (ADVIL) 800 MG tablet Take 1 tablet (800 mg total) by mouth every 8 (eight) hours as needed (pain). 04/09/22   Barrett Henle, MD  linaclotide [facility]) 290 MCG CAPS capsule Take 1 capsule (290 mcg total) by mouth daily before breakfast. 07/15/21   Briscoe Deutscher, DO  MOUNJARO 10 MG/0.5ML Pen Inject 10 mg into the skin once a week. 04/29/22   [provider]  Multiple Vitamin (MULTIVITAMIN) tablet Take 1 tablet by mouth daily.    [provider]  polyethylene glycol (MIRALAX) 17 g packet Take 17 g by mouth daily. 01/25/22   Maudie Flakes, MD  tapentadol HCl (NUCYNTA) 75 MG tablet Take 75 mg by mouth in the morning, at noon, and at bedtime. -makes her feel itchy, takes once nightly    [provider]      Allergies    Bee venom, Coconut fatty acids, Mushroom ext cmplx(shiitake-reishi-mait), Nutritional supplements, Other, Shellfish allergy, Strawberry extract, Hydrocodone-acetaminophen, Latex, Molds & smuts, and Hydrocodone-acetaminophen    Review of Systems   Review of Systems  Constitutional:  Positive for chills, fatigue, fever and malaise/fatigue.  HENT:  Positive for congestion.   Respiratory:  Positive for chest tightness and shortness of breath. Negative for wheezing and stridor.   Cardiovascular:  Positive for chest pain, palpitations, leg swelling and syncope.  Gastrointestinal:  Positive for nausea and vomiting. Negative for abdominal pain, constipation and diarrhea.  Genitourinary:  Negative for dysuria.  Musculoskeletal:  Negative for back pain, neck pain and  neck stiffness.  Skin:  Negative for rash and wound.  Neurological:  Positive for syncope and light-headedness. Negative for weakness and headaches.  Psychiatric/Behavioral:  Negative for agitation and confusion.   All other systems reviewed and are negative.   Physical Exam Updated Vital Signs BP 91/63 (BP Location: Right Arm)   Pulse 94   Temp 98.6 F (37 C)   Resp 20   Ht 5\' 6"  (1.676 m)   Wt 94.8 kg   SpO2 95%   BMI 33.73 kg/m  Physical Exam Vitals and nursing note reviewed.  Constitutional:      General: She is not in acute distress.    Appearance: She is well-developed. She is not ill-appearing, toxic-appearing or diaphoretic.  HENT:     Head: Normocephalic and atraumatic.     Right Ear: External ear normal.     Left Ear: External ear normal.     Nose: Nose normal. No congestion or rhinorrhea.     Mouth/Throat:     Mouth: Mucous membranes are moist.     Pharynx: No oropharyngeal exudate.  Eyes:     Conjunctiva/sclera: Conjunctivae normal.     Pupils: Pupils are equal, round, and reactive to light.  Cardiovascular:     Rate and Rhythm: Normal rate.     Pulses: Normal pulses.  Pulmonary:     Effort: No respiratory distress.     Breath sounds: No stridor. No wheezing, rhonchi or rales.  Chest:     Chest wall: No tenderness.  Abdominal:     General: Abdomen is flat. There is no distension.     Tenderness: There is no abdominal tenderness. There is no right CVA tenderness, left CVA tenderness, guarding or rebound.  Musculoskeletal:        General: Swelling and tenderness present.     Cervical back: Normal range of motion and neck supple. No tenderness.  Skin:    General: Skin is warm.     Coloration: Skin is not pale.     Findings: No erythema or rash.  Neurological:     General: No focal deficit present.     Mental Status: She is alert and oriented to person, place, and time.     Motor: No abnormal muscle tone.     Coordination: Coordination normal.      Deep Tendon Reflexes: Reflexes are normal and symmetric.  Psychiatric:        Mood and Affect: Mood normal.     ED Results / Procedures / Treatments   Labs (all labs ordered are listed, but only abnormal results are displayed) Labs Reviewed  CBC WITH DIFFERENTIAL/PLATELET - Abnormal; Notable for the following components:      Result Value   Hemoglobin 11.8 (*)    Monocytes Absolute 1.3 (*)    All other components within normal limits  RESP PANEL BY RT-PCR (RSV, FLU A&B, COVID)  RVPGX2  BRAIN NATRIURETIC PEPTIDE  COMPREHENSIVE METABOLIC PANEL  URINALYSIS, ROUTINE W REFLEX MICROSCOPIC  LACTIC ACID, PLASMA  LACTIC ACID, PLASMA  LIPASE, BLOOD  MAGNESIUM  TSH  TROPONIN I (HIGH SENSITIVITY)  TROPONIN  I (HIGH SENSITIVITY)    EKG None  Radiology DG Ankle Complete Left  Result Date: 06/01/2022 CLINICAL DATA:  Left ankle pain after fall several days ago. EXAM: LEFT ANKLE COMPLETE - 3+ VIEW COMPARISON:  None Available. FINDINGS: There is no evidence of fracture, dislocation, or joint effusion. There is no evidence of arthropathy or other focal bone abnormality. Soft tissues are unremarkable. IMPRESSION: Negative. Electronically Signed   By: Lupita Raider M.D.   On: 06/01/2022 15:04   DG Chest 1 View  Result Date: 06/01/2022 CLINICAL DATA:  Provided history: Cough.  Shortness of breath. EXAM: CHEST  1 VIEW COMPARISON:  Prior chest radiographs 05/25/2022 and earlier. FINDINGS: The cardiomediastinal silhouette is unchanged. Left perihilar atelectasis. Chronic elevation of the left hemidiaphragm, limiting evaluation of the left lung base. Additionally, superimposition of the right hemidiaphragm limits evaluation of the right lung base. Within described limitations, there is no appreciable airspace consolidation. No evidence of pleural effusion or pneumothorax. Degenerative changes of the spine. IMPRESSION: Chronic elevation of the left hemidiaphragm, limiting evaluation of the left lung base.  Additionally, superimposition of the right hemidiaphragm limits evaluation of the right lung base. Within described limitations, there is no appreciable airspace consolidation. However, consider a lateral view radiograph for further evaluation. Left perihilar atelectasis. Electronically Signed   By: Jackey Loge D.O.   On: 06/01/2022 13:39    Procedures Procedures    Medications Ordered in ED Medications  sodium chloride 0.9 % bolus 500 mL (500 mLs Intravenous New Bag/Given 06/01/22 1517)    ED Course/ Medical Decision Making/ A&P                           Medical Decision Making Amount and/or Complexity of Data Reviewed Labs: ordered. Radiology: ordered.    DORYCE MCGREGORY is a 57 y.o. female with a past medical history significant for diabetes, Graves' disease, DVT, GERD, migraines, hypertension, hypercholesterolemia, obesity status post gastric bypass surgery, previous hysterectomy, rheumatoid arthritis, and recent admission for recurrent syncope who presents with fevers, chills, productive cough, chest discomfort, left ankle pain, left leg discomfort and swelling, and recurrent syncope.  Patient reports that she was discharged in the hospital several days ago after workup for recurrent syncope.  She said that since leaving she has had another syncopal episode several days ago and is now having worsened lightheadedness.  She reports that she passed out and fell and hit her head again and has had headache for the last 3 days with some nausea and vomiting.  She reports her left ankle is hurting more.  She also has some left leg pain and swelling.  She reports she is having a chest discomfort that is an aching and pressure in her central chest and palpitations.  She reports she does feel this before should syncopal episode.  She reports no constipation or urinary changes but does have some diarrhea.  She did not report sick contact but it was just in the hospital several days ago.  She reports  been trying to drink more water but does not seem to help.  Due to the recurrent syncope, worsened headache after the fall, and her URI with recent fever, patient presents for reevaluation.  On exam, lungs did have some slight coarseness but chest was nontender.  Abdomen was nontender.  Patient is tenderness in her left ankle with some erythema as well as some tenderness in her left calf.  Intact pulses in extremities.  No focal neurologic deficits initially.  Pupils were symmetric and reactive.  Symmetric smile and normal speech.  Back was nontender.  Neck nontender.  No lacerations or evidence of acute head trauma seen.  Clinical I am concerned that the patient was just discharged several days ago and is now having recurrent syncope.  I am also concerned about this first for infection with recent fever, chest discomfort, shortness of breath, and syncope.  We will get screening workup however due to her history of DVT with unilateral leg pain and swelling will get DVT ultrasound, will get x-ray for the ankle and will get a CT PE study to further evaluate for pulmonary embolism as cause of symptoms.  Will get a CT head given her recurrent head trauma and now the nausea and vomiting and headache.  Due to her constellation of recurrent symptoms and appearance, anticipate she may need admission.  On arrival blood pressure was found to 100, will give some fluids.  Anticipate admission after workup is completed.  Care transferred to oncoming team to await reassessment after workup.         Final Clinical Impression(s) / ED Diagnoses Final diagnoses:  Syncope, unspecified syncope type    Clinical Impression: 1. Syncope, unspecified syncope type     Disposition: Admit  This note was prepared with assistance of Dragon voice recognition software. Occasional wrong-word or sound-a-like substitutions may have occurred due to the inherent limitations of voice recognition software.       Luvina Poirier, Canary Brim, MD 06/01/22 [id number]

## 2022-06-01 NOTE — H&P (Signed)
Hospital Admission History and Physical Service Pager: [phone]  Patient name: [name] Medical record number: [id number] Date of Birth: 11/23/1965 Age: 57 y.o. Gender: female  Primary Care Provider: Iona Hansen, NP Consultants: None Code Status: Full Code, but doesn't want life saving measures after 2 days of non-responsiveness Preferred Emergency Contact:  Fabio Neighbors: [phone] Adella Hare: [phone]  Chief Complaint: Syncope  Assessment and Plan: [name] is a 57 y.o. female presenting with pulmonary embolism, found after work up for syncope. Patient has a hx of syncope and was recently discharged from the hospital for syncope w/ negative workup. Patient reports recent hx of fall 05/29/22 w/ decreased activity level/getting out of bed 2/2 leg pain.    * Pulmonary embolism [facility]) Patient presents w/ PE, found after workup for unexplained syncope and soft BP. Patient started on heparin gtt. Patient presently on RA w/ comfortable work of breathing, but does complain of some dyspnea. VSS on admission w/ minimal tachypnea to 25, and soft BP's as low as 94/59. Troponin negative x1, BNP normal, negative DVT US. -Admit to FPTS, attending Dr. Lum Babe -Awaiting pharmacy med-rec -Heparin gtt per pharmacy -Echo -Carb modified diet -Continuous Telemetry -Pulse ox -AM CBC -AM BMP  Type 2 diabetes mellitus without complications (HCC) Patient on 10 mg mounjaro weekly. Will hold for now in setting of syncope and soft BP. Last A1c 5.3 05/25/22. -Continue to monitor -Consider CBG checks and SSI  Syncope Patient presents w/ syncope following discharge from hospital for syncope on 05/27/22. Patient reports one episode of syncope noted to have hit her head and ankle in the fall. Patient had negative head and ankle imaging, but left medial malleolus remains ttp on exam. Patient BP soft on admission, as low as 94/59. Patient has hx of Vaso-vagal syncope note after tilt table  testing in 2014, and had followed with [facility] from 2015-2019. Patient had zio patch in 2018 that was normal. Patient recently discharged from hospital for syncope, after negative workup (Negative CT/MRI). Patient found to have PE after CT chest for work up of syncope. Also considered adrenal insufficiency, will check cortisol level in the morning.  -NaCl bolus -AM cortisol level -Continue to monitor -Consider orthostatic BP in am -Consider midodrine if BP remains too low  Bipolar disorder (HCC) Currently on Depakote and Doxepin. Doexpin held during last admission for concern of anticholinergic effects causing syncope. Unsure of role of Doxepin in syncope, will hold doxepin in setting of syncope.  -Continue Depakote, hold Doxepin -Awaiting med rec  Graves' disease Patient w/ hx of graves, s/p radioactive iodine therapy. TSH slightly elevated on admission, to 4.687. Unsure etiology of elevation, may be in setting of recent illness. Will recheck outpatient.    FEN/GI: Carb Modified diet VTE Prophylaxis: Heparin  Disposition: Admit to FPTS  History of Present Illness:  [name] is a 57 y.o. female presenting with syncope. Report's she's been passing out for years, but it's been worse since October. Was recently admitted for syncope on 05/24/22. She reports she started having syncope again 2 days after he discharge on 05/27/22. Patient reports having passed out once since d/c and having hit head during fall. Also reports pain and swelling in left ankle following fall. Also reports dyspnea at rest for the last month, and using her inhaler more. Also reports fever to 101.8 last night, with body aches sneezing and rhinorrhea. Symptoms have been off and on for the last 2 weeks. Patient also  reports HA off and on for week, but worse after fall/hitting head on Saturday. Patient reports hx of migraines, receiving botox shots every 3 months.    In the ED, patient was found to have PE and  started on heparin gtt.  Review Of Systems: Per HPI with the following additions:   Pertinent Past Medical History: Vaso-vagal Syncope  Remainder reviewed in history tab.   Pertinent Past Surgical History:   Remainder reviewed in history tab.  Pertinent Social History: Tobacco use: Yes/No/Former Alcohol use: None Other Substance use: None Lives with Son  Pertinent Family History:   Remainder reviewed in history tab.   Important Outpatient Medications: Nucynta 75 mg Phentermine 37.5 mg Zyrtec 10 mg  Remainder reviewed in medication history.   Objective: BP 112/84 (BP Location: Left Arm)   Pulse 91   Temp 98.1 F (36.7 C) (Oral)   Resp (!) 22   Ht 5\' 6"  (1.676 m)   Wt 94.8 kg   SpO2 100%   BMI 33.73 kg/m  Exam: General: NAD, polite, well appearing woman ENTM: MMM Cardiovascular: RRR, NRMG Respiratory: CTABL Gastrointestinal: Soft, NTTP, non-distended MSK: TTP and erythema over left medial malleolus, no swilling or edema   Labs:  CBC BMET  Recent Labs  Lab 06/01/22 1837  WBC 6.9  HGB 11.0*  HCT 34.1*  PLT 207   Recent Labs  Lab 06/01/22 1300  NA 137  K 3.9  CL 103  CO2 25  BUN 20  CREATININE 1.09*  GLUCOSE 123*  CALCIUM 8.7*     Imaging Studies Performed:  Imaging Study (ie. Chest x-ray) Impression from Radiologist:  CT Angio Chest PE W and/or Wo Contrast  Result Date: 06/01/2022 CLINICAL DATA:  Left leg pain and swelling, chest pain, shortness of breath, syncope EXAM: CT ANGIOGRAPHY CHEST WITH CONTRAST TECHNIQUE: Multidetector CT imaging of the chest was performed using the standard protocol during bolus administration of intravenous contrast. Multiplanar CT image reconstructions and MIPs were obtained to evaluate the vascular anatomy. RADIATION DOSE REDUCTION: This exam was performed according to the departmental dose-optimization program which includes automated exposure control, adjustment of the mA and/or kV according to patient size  and/or use of iterative reconstruction technique. CONTRAST:  54mL OMNIPAQUE IOHEXOL 350 MG/ML SOLN COMPARISON:  Chest radiographs done earlier today, previous CT done on 04/11/2003 FINDINGS: Cardiovascular: There are intraluminal filling defects in few segmental and subsegmental branches in right upper lobe and right lower lobe. There is small thrombus burden. There is no demonstrable saddle embolus. RV LV ratio is 1.13. there is homogeneous enhancement in thoracic aorta. Mediastinum/Nodes: No significant lymphadenopathy is seen. Small pericardial effusion is present. Lungs/Pleura: Breathing motion limits evaluation. Small patchy ground-glass densities seen in both lungs. There is no focal consolidation. Left hemidiaphragm is elevated. There is no pleural effusion or pneumothorax. Upper Abdomen: Surgical staples are seen in stomach suggesting possible previous bariatric surgery. There is reflux of contrast into hepatic veins suggesting right heart dysfunction. Musculoskeletal: No acute findings are seen. Review of the MIP images confirms the above findings. IMPRESSION: Acute pulmonary embolism with small thrombus burden. RV LV ratio is 1.13. It is unlikely that this small thrombus burden PE is causing it. There is probably underlying chronic right heart dysfunction. Small pericardial effusion is seen. There is no evidence of thoracic aortic dissection. There is no focal consolidation. There is no pleural effusion or pneumothorax. There are small faint foci of ground-glass densities in both lungs, possibly suggesting scarring or early interstitial pneumonia. Imaging  finding of small thrombus burden PE was relayed to patient's provider Dr. Andreas Ohm by telephone call. Electronically Signed   By: Ernie Avena M.D.   On: 06/01/2022 17:35   CT HEAD WO CONTRAST ( )  Result Date: 06/01/2022 CLINICAL DATA:  Syncope, trauma EXAM: CT HEAD WITHOUT CONTRAST TECHNIQUE: Contiguous axial images were obtained from the base of  the skull through the vertex without intravenous contrast. RADIATION DOSE REDUCTION: This exam was performed according to the departmental dose-optimization program which includes automated exposure control, adjustment of the mA and/or kV according to patient size and/or use of iterative reconstruction technique. COMPARISON:  05/25/2022 FINDINGS: Brain: No acute intracranial findings are seen in noncontrast CT brain. There are no signs of bleeding within the cranium. Ventricles are not dilated. Cortical sulci are prominent. There is no focal edema or mass effect. Vascular: Unremarkable. Skull: No fracture is seen in calvarium. Sinuses/Orbits: Unremarkable. Other: None. IMPRESSION: No acute intracranial findings are seen in noncontrast CT brain. No significant interval changes are noted. Electronically Signed   By: Ernie Avena M.D.   On: 06/01/2022 17:24   VAS [name] LOWER EXTREMITY VENOUS (DVT) (ONLY [facility] & [facility])  Result Date: 06/01/2022  Lower Venous DVT Study Patient Name:  [name]  Date of Exam:   06/01/2022 Medical Rec #: [id number]        Accession #:    [id number] Date of Birth: [birth year]         Patient Gender: F Patient Age:   74 years Exam Location:  [facility] Procedure:      VAS [name] LOWER EXTREMITY VENOUS (DVT) Referring Phys: Lynden Oxford --------------------------------------------------------------------------------  Indications: Pain, and Swelling.  Comparison Study: Previous exam on 05/05/18 was negative for DVT Performing Technologist: Ernestene Mention RVT, RDMS  Examination Guidelines: A complete evaluation includes B-mode imaging, spectral Doppler, color Doppler, and power Doppler as needed of all accessible portions of each vessel. Bilateral testing is considered an integral part of a complete examination. Limited examinations for reoccurring indications may be performed as noted. The reflux portion of the exam is performed with the patient in reverse Trendelenburg.   +-----+---------------+---------+-----------+----------+--------------+ RIGHTCompressibilityPhasicitySpontaneityPropertiesThrombus Aging +-----+---------------+---------+-----------+----------+--------------+ CFV  Full           Yes      Yes                                 +-----+---------------+---------+-----------+----------+--------------+   +---------+---------------+---------+-----------+----------+--------------+ LEFT     CompressibilityPhasicitySpontaneityPropertiesThrombus Aging +---------+---------------+---------+-----------+----------+--------------+ CFV      Full           Yes      Yes                                 +---------+---------------+---------+-----------+----------+--------------+ SFJ      Full                                                        +---------+---------------+---------+-----------+----------+--------------+ FV Prox  Full           Yes      Yes                                 +---------+---------------+---------+-----------+----------+--------------+  FV Mid   Full           Yes      Yes                                 +---------+---------------+---------+-----------+----------+--------------+ FV DistalFull           Yes      Yes                                 +---------+---------------+---------+-----------+----------+--------------+ PFV      Full                                                        +---------+---------------+---------+-----------+----------+--------------+ POP      Full           Yes      Yes                                 +---------+---------------+---------+-----------+----------+--------------+ PTV      Full                                                        +---------+---------------+---------+-----------+----------+--------------+ PERO     Full                                                         +---------+---------------+---------+-----------+----------+--------------+     Summary: RIGHT: - No evidence of common femoral vein obstruction.  LEFT: - There is no evidence of deep vein thrombosis in the lower extremity.  - No cystic structure found in the popliteal fossa.  *See table(s) above for measurements and observations.    Preliminary    DG Ankle Complete Left  Result Date: 06/01/2022 CLINICAL DATA:  Left ankle pain after fall several days ago. EXAM: LEFT ANKLE COMPLETE - 3+ VIEW COMPARISON:  None Available. FINDINGS: There is no evidence of fracture, dislocation, or joint effusion. There is no evidence of arthropathy or other focal bone abnormality. Soft tissues are unremarkable. IMPRESSION: Negative. Electronically Signed   By: Lupita Raider M.D.   On: 06/01/2022 15:04   DG Chest 1 View  Result Date: 06/01/2022 CLINICAL DATA:  Provided history: Cough.  Shortness of breath. EXAM: CHEST  1 VIEW COMPARISON:  Prior chest radiographs 05/25/2022 and earlier. FINDINGS: The cardiomediastinal silhouette is unchanged. Left perihilar atelectasis. Chronic elevation of the left hemidiaphragm, limiting evaluation of the left lung base. Additionally, superimposition of the right hemidiaphragm limits evaluation of the right lung base. Within described limitations, there is no appreciable airspace consolidation. No evidence of pleural effusion or pneumothorax. Degenerative changes of the spine. IMPRESSION: Chronic elevation of the left hemidiaphragm, limiting evaluation of the left lung base. Additionally, superimposition of the right hemidiaphragm limits evaluation of the right lung base. Within described limitations, there  is no appreciable airspace consolidation. However, consider a lateral view radiograph for further evaluation. Left perihilar atelectasis. Electronically Signed   By: Kellie Simmering D.O.   On: 06/01/2022 13:39       Holley Bouche, MD 06/01/2022, 8:20 PM PGY-2, [facility] pager: [phone], text pages welcome Secure chat group [place]

## 2022-06-01 NOTE — Assessment & Plan Note (Addendum)
Patient presents w/ PE, found after workup for syncope and soft BP.  Patient presently on RA w/ comfortable work of breathing and without dyspnea.  Negative DVT US on admission, echo unremarkable (EF 55-60%).  Patient noted by pharmacy yesterday to be taking estrace at home (now discontinued), which is a possible provoking event for this PE.  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  Can discuss risks/benefits of lifelong anticoagulation outpatient for shared decision making. - Continue Eliquis; loading dose of 10 mg BID for 1 week, then 5 mg BID indefinitely - Provided education on bleeding risk on Eliquis in the event of syncope and fall, instructed patient to present to ED after serious fall - Provided education on thrombotic risks of estrogen, explained that Estrace has been D/Ced - Continuous telemetry and pulse ox

## 2022-06-01 NOTE — ED Triage Notes (Signed)
Pt states she had a syncopal episode 3 days ago. Pt not on blood thinners. Pt c/o lightheadedness, productive cough w/yellow mucous and states she had a temp of 101 yesterday, migraine and head pain and left leg pain since fall.

## 2022-06-01 NOTE — ED Provider Notes (Signed)
  Physical Exam  BP (!) 95/56 (BP Location: Left Arm)   Pulse 88   Temp 98.1 F (36.7 C) (Oral)   Resp 18   Ht 5\' 6"  (1.676 m)   Wt 94.8 kg   SpO2 99%   BMI 33.73 kg/m   Physical Exam  Procedures  Procedures  ED Course / MDM    Medical Decision Making Care assumed at 3 PM.  Patient is here with syncope.  She was recently admitted to family practice for syncope workup and had recurrent syncope after discharge.  Patient was noted to be hypotensive.  Signed out pending CT head and CT angio chest.  6:00 PM CTA chest showed PE.  Patient has enlarged RV as well.  However patient does not have saddle embolus so I do not think it is causing her RV strain.  She may have underlying dilated RV.  Started on heparin.  Family practice to admit for pulmonary embolus and patient will likely need an echo.  CRITICAL CARE Performed by: Wandra Arthurs   Total critical care time: 30 minutes  Critical care time was exclusive of separately billable procedures and treating other patients.  Critical care was necessary to treat or prevent imminent or life-threatening deterioration.  Critical care was time spent personally by me on the following activities: development of treatment plan with patient and/or surrogate as well as nursing, discussions with consultants, evaluation of patient's response to treatment, examination of patient, obtaining history from patient or surrogate, ordering and performing treatments and interventions, ordering and review of laboratory studies, ordering and review of radiographic studies, pulse oximetry and re-evaluation of patient's condition.   Problems Addressed: Other acute pulmonary embolism without acute cor pulmonale ([place]): acute illness or injury Syncope, unspecified syncope type: acute illness or injury  Amount and/or Complexity of Data Reviewed Labs: ordered. Decision-making details documented in ED Course. Radiology: ordered and independent interpretation  performed. Decision-making details documented in ED Course.  Risk Prescription drug management.          Drenda Freeze, MD 06/01/22 [phone]

## 2022-06-01 NOTE — Progress Notes (Signed)
ANTICOAGULATION CONSULT NOTE - Initial Consult  Pharmacy Consult for heparin Indication: pulmonary embolus  Allergies  Allergen Reactions   Bee Venom Anaphylaxis    Other reaction(s): PRURITUS, RASH   Coconut Fatty Acids Anaphylaxis    Patient allergic to coconut in general   Mushroom Ext Cmplx(Shiitake-Reishi-Mait) Anaphylaxis   Nutritional Supplements Anaphylaxis and Itching    walnuts   Other Anaphylaxis, Hives and Itching    Ragweed   Shellfish Allergy Anaphylaxis   Strawberry Extract Anaphylaxis   Hydrocodone-Acetaminophen Itching   Latex    Molds & Smuts    Hydrocodone-Acetaminophen Rash    Other reaction(s): Unknown Other reaction(s): Other (See Comments)    Patient Measurements: Height: 5\' 6"  (167.6 cm) Weight: 94.8 kg (208 lb 15.9 oz) IBW/kg (Calculated) : 59.3 HEPARIN DW (KG): 80.3   Vital Signs: Temp: 98.1 F (36.7 C) (01/09 1736) Temp Source: Oral (01/09 1736) BP: 95/56 (01/09 1736) Pulse Rate: 88 (01/09 1736)  Labs: Recent Labs    06/01/22 1300 06/01/22 1519  HGB 11.8*  --   HCT 37.3  --   PLT 218  --   CREATININE 1.09*  --   TROPONINIHS 5 3    Estimated Creatinine Clearance: 65.3 mL/min (A) (by C-G formula based on SCr of 1.09 mg/dL (H)).   Medical History: Past Medical History:  Diagnosis Date   Acute kidney injury (HCC) 10/25/2020   Acute respiratory disease due to COVID-19 virus 10/25/2020   AKI (acute kidney injury) (HCC) 10/25/2020   Allergic rhinitis    Allergic to cats    pet dander   Allergy    Anemia    Anxiety    Arthritis 09/2018   both feet   Asthma    Back pain    Bipolar disorder (HCC)    Cervicalgia    Chronic fatigue syndrome    Chronic low back pain with left-sided sciatica    Class 3 severe obesity with serious comorbidity and body mass index (BMI) of 40.0 to 44.9 in adult [facility]) 11/14/2014   Constipation    Depression    Diabetes mellitus without complication (HCC)    "pre"   Dyspnea    Environmental  allergies    Fibromyalgia    GERD (gastroesophageal reflux disease)    Grave's disease    Heart murmur    High cholesterol    History of blood clots    Hypertension    Joint pain    Lactose intolerance    Lower extremity edema    Multiple food allergies    OSA (obstructive sleep apnea)    Osteoporosis    Palpitations    Panic disorder    Pharyngoesophageal dysphagia 06/24/2014   Prediabetes 05/11/2019   Protein-calorie malnutrition, mild (HCC) 06/23/2021   Rheumatoid arthritis (HCC)    Risk for falls    Sciatica    Severe 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 (HCC)    Sleep apnea    no cpap worn   Subclinical hypothyroidism 05/11/2019   Syncope    Syncope and collapse    Thyroid disease    Vasovagal syncope    Vertigo    Vitamin D deficiency      Assessment: Patient recently discharged from inpatient admission for recurrent syncope. Readmitted to ED for recurrent syncope, unilateral leg pain, URI, and worsened headache after fall. Pmh of DVT not on AC PTA. Pharmacy consulted to dose heparin for acute PE.   Goal of Therapy:  Heparin level 0.3-0.7 units/ml Monitor  platelets by anticoagulation protocol: Yes   Plan:  Give heparin 4000 units IV x1 bolus  Start heparin infusion at 1400 units/hr  Will f/u heparin level in 6 hours Monitor daily heparin level and CBC Continue to monitor H&H    Gena Fray, PharmD PGY1 Pharmacy Resident   06/01/2022 6:01 PM

## 2022-06-01 NOTE — ED Notes (Signed)
Patient is resting comfortably. 

## 2022-06-01 NOTE — Assessment & Plan Note (Addendum)
Currently on Depakote and Doxepin. Doexpin held during last admission for concern of anticholinergic effects causing syncope. Unsure of role of Doxepin in syncope, will hold doxepin in setting of syncope.  -Continue Depakote, hold Doxepin -Awaiting med rec

## 2022-06-01 NOTE — Assessment & Plan Note (Addendum)
Patient on 10 mg mounjaro weekly. Will hold while hospitalized. Last A1c 5.3 05/25/22. - Continue to monitor - Consider CBG checks and SSI

## 2022-06-01 NOTE — Assessment & Plan Note (Addendum)
BP soft overnight but better in AM (107/61>121/63).  Echo largely unremarkable (EF 55-60%).  Given left foot injury with poor weight bearing and newly started Eliquis, the potential sequelae of a fall are now increased for this patient.  Will consult cardiology to discuss improving hypotension.  Though PE could have incited syncopal episode, extensive syncope history indicates PE unlikely to be sole cause.  Differential also includes adrenal insufficiency, and patient's AM cortisol was within indeterminate range of 3-18.  ACTH stim test did not show evidence of adrenal suppression.  Of note, patient does not have a history of hyperkalemia expected with AI.  TSH not in hypothyroid range. - Outpatient cardiology follow up, exercise tolerance test - Continue to monitor BP - Check orthostatic BP - Consider midodrine if BP consistently low - Discuss falls and syncope mitigation with patient; abdominal binder, good hydration

## 2022-06-02 ENCOUNTER — Inpatient Hospital Stay (HOSPITAL_COMMUNITY): Payer: 59

## 2022-06-02 ENCOUNTER — Other Ambulatory Visit (HOSPITAL_COMMUNITY): Payer: Self-pay

## 2022-06-02 DIAGNOSIS — I2609 Other pulmonary embolism with acute cor pulmonale: Secondary | ICD-10-CM | POA: Diagnosis not present

## 2022-06-02 DIAGNOSIS — I2699 Other pulmonary embolism without acute cor pulmonale: Secondary | ICD-10-CM | POA: Diagnosis not present

## 2022-06-02 DIAGNOSIS — R55 Syncope and collapse: Secondary | ICD-10-CM | POA: Diagnosis not present

## 2022-06-02 DIAGNOSIS — M25572 Pain in left ankle and joints of left foot: Secondary | ICD-10-CM

## 2022-06-02 DIAGNOSIS — M79672 Pain in left foot: Secondary | ICD-10-CM | POA: Diagnosis not present

## 2022-06-02 LAB — ECHOCARDIOGRAM COMPLETE
AR max vel: 2.38 cm2
AV Area VTI: 2.29 cm2
AV Area mean vel: 2.23 cm2
AV Mean grad: 6 mmHg
AV Peak grad: 10.6 mmHg
Ao pk vel: 1.63 m/s
Area-P 1/2: 3.65 cm2
Height: 66 in
S' Lateral: 3.1 cm
Weight: 3343.94 oz

## 2022-06-02 LAB — CBC
HCT: 33.1 % — ABNORMAL LOW (ref 36.0–46.0)
Hemoglobin: 10.5 g/dL — ABNORMAL LOW (ref 12.0–15.0)
MCH: 29.3 pg (ref 26.0–34.0)
MCHC: 31.7 g/dL (ref 30.0–36.0)
MCV: 92.5 fL (ref 80.0–100.0)
Platelets: 191 10*3/uL (ref 150–400)
RBC: 3.58 MIL/uL — ABNORMAL LOW (ref 3.87–5.11)
RDW: 14.7 % (ref 11.5–15.5)
WBC: 6.2 10*3/uL (ref 4.0–10.5)
nRBC: 0 % (ref 0.0–0.2)

## 2022-06-02 LAB — BASIC METABOLIC PANEL
Anion gap: 8 (ref 5–15)
BUN: 20 mg/dL (ref 6–20)
CO2: 24 mmol/L (ref 22–32)
Calcium: 7.8 mg/dL — ABNORMAL LOW (ref 8.9–10.3)
Chloride: 105 mmol/L (ref 98–111)
Creatinine, Ser: 0.91 mg/dL (ref 0.44–1.00)
GFR, Estimated: >60 mL/min (ref >60)
Glucose, Bld: 79 mg/dL (ref 70–99)
Potassium: 4.1 mmol/L (ref 3.5–5.1)
Sodium: 137 mmol/L (ref 135–145)

## 2022-06-02 LAB — HEPARIN LEVEL (UNFRACTIONATED)
Heparin Unfractionated: >1.1 IU/mL — ABNORMAL HIGH (ref 0.30–0.70)
Heparin Unfractionated: 0.94 IU/mL — ABNORMAL HIGH (ref 0.30–0.70)
Heparin Unfractionated: 0.72 IU/mL — ABNORMAL HIGH (ref 0.30–0.70)

## 2022-06-02 LAB — CORTISOL-AM, BLOOD: Cortisol - AM: 2.6 ug/dL — ABNORMAL LOW (ref 6.7–22.6)

## 2022-06-02 LAB — GLUCOSE, CAPILLARY: Glucose-Capillary: 115 mg/dL — ABNORMAL HIGH (ref 70–99)

## 2022-06-02 MED ORDER — APIXABAN 5 MG PO TABS
10.0000 mg | ORAL_TABLET | Freq: Two times a day (BID) | ORAL | Status: DC
  Administered 2022-06-02 – 2022-06-04 (×5): 10 mg via ORAL
  Filled 2022-06-02 (×5): qty 2

## 2022-06-02 MED ORDER — LIDOCAINE 5 % EX PTCH
1.0000 | MEDICATED_PATCH | CUTANEOUS | Status: DC
  Administered 2022-06-02 – 2022-06-04 (×3): 1 via TRANSDERMAL
  Filled 2022-06-02 (×3): qty 1

## 2022-06-02 MED ORDER — ACETAMINOPHEN 500 MG PO TABS
1000.0000 mg | ORAL_TABLET | Freq: Four times a day (QID) | ORAL | Status: DC | PRN
  Administered 2022-06-02 – 2022-06-04 (×2): 1000 mg via ORAL
  Filled 2022-06-02 (×2): qty 2

## 2022-06-02 MED ORDER — DIVALPROEX SODIUM ER 500 MG PO TB24
1000.0000 mg | ORAL_TABLET | Freq: Every day | ORAL | Status: DC
  Administered 2022-06-02 – 2022-06-04 (×3): 1000 mg via ORAL
  Filled 2022-06-02 (×3): qty 2

## 2022-06-02 MED ORDER — DIVALPROEX SODIUM 500 MG PO DR TAB: 500.0000 mg | DELAYED_RELEASE_TABLET | Freq: Two times a day (BID) | ORAL | Status: DC

## 2022-06-02 MED ORDER — APIXABAN 5 MG PO TABS: 5.0000 mg | ORAL_TABLET | Freq: Two times a day (BID) | ORAL | Status: DC

## 2022-06-02 MED ORDER — TAPENTADOL HCL 50 MG PO TABS
75.0000 mg | ORAL_TABLET | Freq: Three times a day (TID) | ORAL | Status: DC | PRN
  Administered 2022-06-02 – 2022-06-04 (×4): 75 mg via ORAL
  Filled 2022-06-02 (×4): qty 2

## 2022-06-02 MED ORDER — DICLOFENAC SODIUM 1 % EX GEL
4.0000 g | Freq: Four times a day (QID) | CUTANEOUS | Status: DC
  Administered 2022-06-02 – 2022-06-03 (×6): 4 g via TOPICAL
  Filled 2022-06-02: qty 100

## 2022-06-02 NOTE — Progress Notes (Signed)
  Echocardiogram 2D Echocardiogram has been performed.  Ronny Flurry 06/02/2022, 11:04 AM

## 2022-06-02 NOTE — Hospital Course (Addendum)
[name] is a 57 y.o.female with a history of vasovagal syncope, fall on 05/29/22, T2DM, HTN, bipolar disorder, graves disease, RA, and s/p gastric bypass in 2008 who was admitted to the [place] at [facility] for syncope and incidentally found to have pulmonary embolism. Her hospital course is detailed below:  Syncope Patient presents after syncopal episode on 05/29/22. Last hospitalized for syncope 1/2-05/27/22 with negative workup.  BP soft, ranging from 99/55 to 121/63, no significant change s/p fluid bolus.  Hx of Vaso-vagal syncope noted after tilt table testing in 2014, ZIO patch in 2018 was normal.  Echo results this admission unremarkable (55-60% EF).  AM cortisol was indeterminate and ACTH stimulation test did not show evidence of adrenal suppression.  Cardiology consulted and recommend outpatient follow up with exercise tolerance test and plan to conduct 30 day outpatient cardiac monitoring.  Discontinued Doxepin and Nucynta on discharge to reduce falls.  Counseled patient on possibility of insomnia and other discomfort as she stops these meds, emphasized importance of fall prevention. Recommended abdominal binder and hydration.  Pulmonary embolism [facility]) Patient presents with PE found incidentally during syncope workup.  Patient remained stable on room air throughout hospitalization.  Negative DVT US on admission, echo unremarkable (EF 55-60%).  Patient noted to be taking Estrace at home and now discontinued, which is a possible provoking event for this PE. Discharged on Eliquis; loading dose of 10 mg BID for 1 week (on day 3 at discharge), then 5 mg BID for 21 days for total of 28 days anticoagulation.  Patient does have past Hx of DVT in chart, but also is very high falls and bleed risk.  Provided education concerning bleed risk on Eliquis in the event of syncope and fall, instructed patient to present to ED after fall or head injury.  Discontinued Advil to reduce bleed risk, educated  patient.  Acute left ankle pain L ankle injury following syncopal episode. On arrival, patient unable to bear weight with significant foot and ankle swelling as well as left medial malleolus TTP.  Foot and ankle XR obtained, did not show any acute osseous pathology, likely just soft tissue injury.  PT and OT evaluated patient, recommended shower chair, rolling walker as well as home health.  Patient discharged with Voltaren gel and lidocaine patch PRN as well as tylenol OTC.  Discontinued NSAID due to bleed risk, recommended Tylenol instead.  Other chronic conditions were medically managed with home medications and formulary alternatives as necessary (Graves disease, bipolar disorder, T2DM).  Issues for follow up: Doxepin and Nucynta discontinued d/t syncopal episodes. Ensure pt understanding. Estrace d/c'd d/t PE, ensure pt understating.  Ensure follow up with cariology Ensure follow up with [facility] PT/OT Follow up ankle pain

## 2022-06-02 NOTE — ED Notes (Signed)
Echo at bedside

## 2022-06-02 NOTE — Progress Notes (Signed)
PT Cancellation Note  Patient Details Name: [name] MRN: [id number] DOB: [birth year]   Cancelled Treatment:    Reason Eval/Treat Not Completed: Medical issues which prohibited therapy Pt with new PE and <24 hr anticoagulation.  Will hold per PT guidelines - notified MD who stated ok to hold till tomorrow. Abran Richard, PT Acute Rehab [facility] [phone]   Karlton Lemon 06/02/2022, 2:21 PM

## 2022-06-02 NOTE — Progress Notes (Signed)
ANTICOAGULATION CONSULT NOTE - Follow Up Consult  Pharmacy Consult for heparin Indication: atrial fibrillation  Labs: Recent Labs    06/01/22 1300 06/01/22 1519 06/01/22 1837 06/02/22 0035 06/02/22 0150  HGB 11.8*  --  11.0*  --   --   HCT 37.3  --  34.1*  --   --   PLT 218  --  207  --   --   HEPARINUNFRC  --   --   --  >1.10* 0.94*  CREATININE 1.09*  --   --   --   --   TROPONINIHS 5 3  --   --   --     Assessment: 57yo female supratherapeutic on heparin with initial dosing for PE; no infusion issues or signs of bleeding per RN.  Goal of Therapy:  Heparin level 0.3-0.7 units/ml   Plan:  Will decrease heparin infusion by 2 units/kg/hr to 1200 units/hr and check level in 6 hours.    Wynona Neat, PharmD, BCPS  06/02/2022,3:15 AM

## 2022-06-02 NOTE — TOC Benefit Eligibility Note (Signed)
Patient Advocate Encounter  Insurance verification completed.    The patient is currently admitted and upon discharge could be taking Eliquis 5 mg.  The current 30 day co-pay is $0.00.   The patient is insured through [employer] Medicare Part D   Chiante Peden Goren, CPHT Pharmacy Patient Advocate Specialist [facility] Patient Advocate Team Direct Number: [phone]  Fax: [phone]       

## 2022-06-02 NOTE — Assessment & Plan Note (Addendum)
Swelling and pain mildly improved.  Left medial malleolus remains TTP on exam and still struggles to bear weight.  Instability and lack of weight bearing contributes to falls risk, which is significant given recent fall.  PT and OT recs are home health with rolling walker.  XR ankle/foot unremarkable.  Consider MRI outpatient if not improving to evaluate soft tissue injury; further imaging now would not change management inpatient, particularly shortly after injury. - Shower chair, rolling walker - Continue to monitor - Lidocaine patch, Voltaren gel, ice packs for pain

## 2022-06-02 NOTE — ED Notes (Signed)
ED TO INPATIENT HANDOFF REPORT  ED Nurse Name and Phone #: Feliz Beam [phone]  S Name/Age/Gender [name] 57 y.o. female Room/Bed: 043C/043C  Code Status   Code Status: Full Code  Home/SNF/Other Home Patient oriented to: self, place, time, and situation Is this baseline? Yes   Triage Complete: Triage complete  Chief Complaint Pulmonary embolism [facility]) [I26.99]  Triage Note Pt states she had a syncopal episode 3 days ago. Pt not on blood thinners. Pt c/o lightheadedness, productive cough w/yellow mucous and states she had a temp of 101 yesterday, migraine and head pain and left leg pain since fall.     Allergies Allergies  Allergen Reactions   Bee Venom Anaphylaxis and Rash   Coconut (Cocos Nucifera) Anaphylaxis and Itching   Coconut Fatty Acids Anaphylaxis   Food Anaphylaxis and Itching    Walnuts - anaphylaxis, itching   Mixed Ragweed Anaphylaxis, Itching and Rash   Molds & Smuts Anaphylaxis   Mushroom Ext Cmplx(Shiitake-Reishi-Mait) Anaphylaxis   Shellfish Allergy Anaphylaxis   Strawberry Extract Anaphylaxis   Latex Rash   Norco [Hydrocodone-Acetaminophen] Rash    Level of Care/Admitting Diagnosis ED Disposition     ED Disposition  Admit   Condition  --   Comment  Hospital Area: [facility] [[id number]]  Level of Care: Telemetry Cardiac [[id number]]  May admit patient to [facility] or [facility] if equivalent level of care is available:: No  Covid Evaluation: Asymptomatic - no recent exposure (last 10 days) testing not required  Diagnosis: Pulmonary embolism [facility]) [[id number]]  Admitting Physician: Doreene Eland [[id number]]  Attending Physician: Doreene Eland [[id number]]  Certification:: I certify this patient will need inpatient services for at least 2 midnights  Estimated Length of Stay: 2          B Medical/Surgery History Past Medical History:  Diagnosis Date   Acute kidney injury (HCC) 10/25/2020   Acute respiratory disease due to COVID-19  virus 10/25/2020   AKI (acute kidney injury) (HCC) 10/25/2020   Allergic rhinitis    Allergic to cats    pet dander   Allergy    Anemia    Anxiety    Arthritis 09/2018   both feet   Asthma    Back pain    Bipolar disorder (HCC)    Cervicalgia    Chronic fatigue syndrome    Chronic low back pain with left-sided sciatica    Class 3 severe obesity with serious comorbidity and body mass index (BMI) of 40.0 to 44.9 in adult [facility]) 11/14/2014   Constipation    Depression    Diabetes mellitus without complication (HCC)    "pre"   Dyspnea    Environmental allergies    Fibromyalgia    GERD (gastroesophageal reflux disease)    Grave's disease    Heart murmur    High cholesterol    History of blood clots    Hypertension    Joint pain    Lactose intolerance    Lower extremity edema    Multiple food allergies    OSA (obstructive sleep apnea)    Osteoporosis    Palpitations    Panic disorder    Pharyngoesophageal dysphagia 06/24/2014   Prediabetes 05/11/2019   Protein-calorie malnutrition, mild (HCC) 06/23/2021   Rheumatoid arthritis (HCC)    Risk for falls    Sciatica    Severe 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 (HCC)    Sleep apnea    no cpap worn  Subclinical hypothyroidism 05/11/2019   Syncope    Syncope and collapse    Thyroid disease    Vasovagal syncope    Vertigo    Vitamin D deficiency    Past Surgical History:  Procedure Laterality Date   ABDOMINAL HYSTERECTOMY  2006   COLONOSCOPY     GASTRIC BYPASS  2008     A IV Location/Drains/Wounds Patient Lines/Drains/Airways Status     Active Line/Drains/Airways     Name Placement date Placement time Site Days   Peripheral IV 06/01/22 20 G Right Antecubital 06/01/22  1516  Antecubital  1            Intake/Output Last 24 hours  Intake/Output Summary (Last 24 hours) at 06/02/2022 1149 Last data filed at 06/02/2022 1125 Gross per 24 hour  Intake 247.58 ml  Output --  Net 247.58  ml    Labs/Imaging Results for orders placed or performed during the hospital encounter of 06/01/22 (from the past 48 hour(s))  Resp panel by RT-PCR (RSV, Flu A&B, Covid) Anterior Nasal Swab     Status: None   Collection Time: 06/01/22 12:49 PM   Specimen: Anterior Nasal Swab  Result Value Ref Range   SARS Coronavirus 2 by RT PCR NEGATIVE NEGATIVE    Comment: (NOTE) SARS-CoV-2 target nucleic acids are NOT DETECTED.  The SARS-CoV-2 RNA is generally detectable in upper respiratory specimens during the acute phase of infection. The lowest concentration of SARS-CoV-2 viral copies this assay can detect is 138 copies/mL. A negative result does not preclude SARS-Cov-2 infection and should not be used as the sole basis for treatment or other patient management decisions. A negative result may occur with  improper specimen collection/handling, submission of specimen other than nasopharyngeal swab, presence of viral mutation(s) within the areas targeted by this assay, and inadequate number of viral copies(<138 copies/mL). A negative result must be combined with clinical observations, patient history, and epidemiological information. The expected result is Negative.  Fact Sheet for Patients:  BloggerCourse.comhttps://www.fda.gov/media/152166/download  Fact Sheet for Healthcare Providers:  SeriousBroker.ithttps://www.fda.gov/media/152162/download  This test is no t yet approved or cleared by the [place] FDA and  has been authorized for detection and/or diagnosis of SARS-CoV-2 by FDA under an Emergency Use Authorization (EUA). This EUA will remain  in effect (meaning this test can be used) for the duration of the COVID-19 declaration under Section 564(b)(1) of the Act, 21 U.S.C.section 360bbb-3(b)(1), unless the authorization is terminated  or revoked sooner.       Influenza A by PCR NEGATIVE NEGATIVE   Influenza B by PCR NEGATIVE NEGATIVE    Comment: (NOTE) The Xpert Xpress SARS-CoV-2/FLU/RSV plus assay is  intended as an aid in the diagnosis of influenza from Nasopharyngeal swab specimens and should not be used as a sole basis for treatment. Nasal washings and aspirates are unacceptable for Xpert Xpress SARS-CoV-2/FLU/RSV testing.  Fact Sheet for Patients: BloggerCourse.comhttps://www.fda.gov/media/152166/download  Fact Sheet for Healthcare Providers: SeriousBroker.ithttps://www.fda.gov/media/152162/download  This test is not yet approved or cleared by the [place] FDA and has been authorized for detection and/or diagnosis of SARS-CoV-2 by FDA under an Emergency Use Authorization (EUA). This EUA will remain in effect (meaning this test can be used) for the duration of the COVID-19 declaration under Section 564(b)(1) of the Act, 21 U.S.C. section 360bbb-3(b)(1), unless the authorization is terminated or revoked.     Resp Syncytial Virus by PCR NEGATIVE NEGATIVE    Comment: (NOTE) Fact Sheet for Patients: BloggerCourse.comhttps://www.fda.gov/media/152166/download  Fact Sheet for Healthcare Providers: SeriousBroker.ithttps://www.fda.gov/media/152162/download  This test is not yet approved or cleared by the [place] and has been authorized for detection and/or diagnosis of SARS-CoV-2 by FDA under an Emergency Use Authorization (EUA). This EUA will remain in effect (meaning this test can be used) for the duration of the COVID-19 declaration under Section 564(b)(1) of the Act, 21 U.S.C. section 360bbb-3(b)(1), unless the authorization is terminated or revoked.  Performed at [facility], [place] [address], [place], [place] [address]   CBC with Differential     Status: Abnormal   Collection Time: 06/01/22  1:00 PM  Result Value Ref Range   WBC 6.8 4.0 - 10.5 K/uL   RBC 4.05 3.87 - 5.11 MIL/uL   Hemoglobin 11.8 (L) 12.0 - 15.0 g/dL   HCT 37.3 36.0 - 46.0 %   MCV 92.1 80.0 - 100.0 fL   MCH 29.1 26.0 - 34.0 pg   MCHC 31.6 30.0 - 36.0 g/dL   RDW 14.8 11.5 - 15.5 %   Platelets 218 150 - 400 K/uL   nRBC 0.0 0.0 - 0.2 %    Neutrophils Relative % 62 %   Neutro Abs 4.2 1.7 - 7.7 K/uL   Lymphocytes Relative 17 %   Lymphs Abs 1.1 0.7 - 4.0 K/uL   Monocytes Relative 19 %   Monocytes Absolute 1.3 (H) 0.1 - 1.0 K/uL   Eosinophils Relative 1 %   Eosinophils Absolute 0.1 0.0 - 0.5 K/uL   Basophils Relative 0 %   Basophils Absolute 0.0 0.0 - 0.1 K/uL   Immature Granulocytes 1 %   Abs Immature Granulocytes 0.04 0.00 - 0.07 K/uL    Comment: Performed at [facility] [address], [place], [place] [address]  Comprehensive metabolic panel     Status: Abnormal   Collection Time: 06/01/22  1:00 PM  Result Value Ref Range   Sodium 137 135 - 145 mmol/L   Potassium 3.9 3.5 - 5.1 mmol/L   Chloride 103 98 - 111 mmol/L   CO2 25 22 - 32 mmol/L   Glucose, Bld 123 (H) 70 - 99 mg/dL    Comment: Glucose reference range applies only to samples taken after fasting for at least 8 hours.   BUN 20 6 - 20 mg/dL   Creatinine, Ser 1.09 (H) 0.44 - 1.00 mg/dL   Calcium 8.7 (L) 8.9 - 10.3 mg/dL   Total Protein 6.7 6.5 - 8.1 g/dL   Albumin 3.1 (L) 3.5 - 5.0 g/dL   AST 19 15 - 41 U/L   ALT 13 0 - 44 U/L   Alkaline Phosphatase 57 38 - 126 U/L   Total Bilirubin 0.3 0.3 - 1.2 mg/dL   GFR, Estimated 59 (L) >60 mL/min    Comment: (NOTE) Calculated using the CKD-EPI Creatinine Equation (2021)    Anion gap 9 5 - 15    Comment: Performed at [facility], [place] [address], [place], [place] [address]  Troponin I (High Sensitivity)     Status: None   Collection Time: 06/01/22  1:00 PM  Result Value Ref Range   Troponin I (High Sensitivity) 5 <18 ng/L    Comment: (NOTE) Elevated high sensitivity troponin I (hsTnI) values and significant  changes across serial measurements may suggest ACS but many other  chronic and acute conditions are known to elevate hsTnI results.  Refer to the "Links" section for chest pain algorithms and additional  guidance. Performed at [facility], [place],  [place] [address]    Lipase, blood     Status: None   Collection Time: 06/01/22  1:00 PM  Result Value Ref Range   Lipase 26 11 - 51 U/L    Comment: Performed at [facility], [address], [place], [place] [address]  Magnesium     Status: None   Collection Time: 06/01/22  1:00 PM  Result Value Ref Range   Magnesium 2.2 1.7 - 2.4 mg/dL    Comment: Performed at [facility], [address] [place], [place] [address]  TSH     Status: Abnormal   Collection Time: 06/01/22  1:00 PM  Result Value Ref Range   TSH 4.687 (H) 0.350 - 4.500 uIU/mL    Comment: Performed by a 3rd Generation assay with a functional sensitivity of <=0.01 uIU/mL. Performed at [facility], [address], [place], [place] [address]   Brain natriuretic peptide     Status: None   Collection Time: 06/01/22  1:56 PM  Result Value Ref Range   B Natriuretic Peptide 12.1 0.0 - 100.0 pg/mL    Comment: Performed at [facility], [address], [place], [place] [address]  Lactic acid, plasma     Status: None   Collection Time: 06/01/22  3:19 PM  Result Value Ref Range   Lactic Acid, Venous 1.0 0.5 - 1.9 mmol/L    Comment: Performed at [facility], [address] [place], [place] [address]  Troponin I (High Sensitivity)     Status: None   Collection Time: 06/01/22  3:19 PM  Result Value Ref Range   Troponin I (High Sensitivity) 3 <18 ng/L    Comment: (NOTE) Elevated high sensitivity troponin I (hsTnI) values and significant  changes across serial measurements may suggest ACS but many other  chronic and acute conditions are known to elevate hsTnI results.  Refer to the "Links" section for chest pain algorithms and additional  guidance. Performed at [facility], [address], [place], [place] [address]   Urinalysis, Routine w reflex microscopic Urine, Clean Catch     Status: Abnormal   Collection Time: 06/01/22  4:12 PM  Result Value Ref Range   Color, Urine YELLOW YELLOW   APPearance HAZY  (A) CLEAR   Specific Gravity, Urine 1.026 1.005 - 1.030   pH 5.0 5.0 - 8.0   Glucose, UA NEGATIVE NEGATIVE mg/dL   Hgb urine dipstick NEGATIVE NEGATIVE   Bilirubin Urine NEGATIVE NEGATIVE   Ketones, ur NEGATIVE NEGATIVE mg/dL   Protein, ur NEGATIVE NEGATIVE mg/dL   Nitrite NEGATIVE NEGATIVE   Leukocytes,Ua NEGATIVE NEGATIVE    Comment: Performed at [facility], [address], [place], [place] [address]  CBC     Status: Abnormal   Collection Time: 06/01/22  6:37 PM  Result Value Ref Range   WBC 6.9 4.0 - 10.5 K/uL   RBC 3.75 (L) 3.87 - 5.11 MIL/uL   Hemoglobin 11.0 (L) 12.0 - 15.0 g/dL   HCT 76.1 (L) 95.0 - 93.2 %   MCV 90.9 80.0 - 100.0 fL   MCH 29.3 26.0 - 34.0 pg   MCHC 32.3 30.0 - 36.0 g/dL   RDW 67.1 24.5 - 80.9 %   Platelets 207 150 - 400 K/uL   nRBC 0.0 0.0 - 0.2 %    Comment: Performed at [facility], [address], [place], [place] [address]  Heparin level (unfractionated)  Status: Abnormal   Collection Time: 06/02/22 12:35 AM  Result Value Ref Range   Heparin Unfractionated >1.10 (H) 0.30 - 0.70 IU/mL    Comment: (NOTE) The clinical reportable range upper limit is being lowered to >1.10 to align with the FDA approved guidance for the current laboratory assay.  If heparin results are below expected values, and patient dosage has  been confirmed, suggest follow up testing of antithrombin III levels. Performed at [facility], [address], [place], [place] [address]   Heparin level (unfractionated)     Status: Abnormal   Collection Time: 06/02/22  1:50 AM  Result Value Ref Range   Heparin Unfractionated 0.94 (H) 0.30 - 0.70 IU/mL    Comment: (NOTE) The clinical reportable range upper limit is being lowered to >1.10 to align with the FDA approved guidance for the current laboratory assay.  If heparin results are below expected values, and patient dosage has  been confirmed, suggest follow up testing of antithrombin III  levels. Performed at [facility], [address] [place], [place] [address]   Heparin level (unfractionated)     Status: Abnormal   Collection Time: 06/02/22  5:22 AM  Result Value Ref Range   Heparin Unfractionated 0.72 (H) 0.30 - 0.70 IU/mL    Comment: (NOTE) The clinical reportable range upper limit is being lowered to >1.10 to align with the FDA approved guidance for the current laboratory assay.  If heparin results are below expected values, and patient dosage has  been confirmed, suggest follow up testing of antithrombin III levels. Performed at [facility], [address], [place], [place] [address]   CBC     Status: Abnormal   Collection Time: 06/02/22  5:22 AM  Result Value Ref Range   WBC 6.2 4.0 - 10.5 K/uL   RBC 3.58 (L) 3.87 - 5.11 MIL/uL   Hemoglobin 10.5 (L) 12.0 - 15.0 g/dL   HCT 91.4 (L) 78.2 - 95.6 %   MCV 92.5 80.0 - 100.0 fL   MCH 29.3 26.0 - 34.0 pg   MCHC 31.7 30.0 - 36.0 g/dL   RDW 21.3 08.6 - 57.8 %   Platelets 191 150 - 400 K/uL   nRBC 0.0 0.0 - 0.2 %    Comment: Performed at [facility], [address] [place], [place] [address]  Basic metabolic panel     Status: Abnormal   Collection Time: 06/02/22  5:22 AM  Result Value Ref Range   Sodium 137 135 - 145 mmol/L   Potassium 4.1 3.5 - 5.1 mmol/L   Chloride 105 98 - 111 mmol/L   CO2 24 22 - 32 mmol/L   Glucose, Bld 79 70 - 99 mg/dL    Comment: Glucose reference range applies only to samples taken after fasting for at least 8 hours.   BUN 20 6 - 20 mg/dL   Creatinine, Ser 9.52 0.44 - 1.00 mg/dL   Calcium 7.8 (L) 8.9 - 10.3 mg/dL   GFR, Estimated >84 >13 mL/min    Comment: (NOTE) Calculated using the CKD-EPI Creatinine Equation (2021)    Anion gap 8 5 - 15    Comment: Performed at [facility], [address], [place], [place] [address]   CT Angio Chest PE W and/or Wo Contrast  Result Date: 06/01/2022 CLINICAL DATA:  Left leg pain and swelling, chest pain, shortness  of breath, syncope EXAM: CT ANGIOGRAPHY CHEST WITH CONTRAST TECHNIQUE: Multidetector CT imaging  of the chest was performed using the standard protocol during bolus administration of intravenous contrast. Multiplanar CT image reconstructions and MIPs were obtained to evaluate the vascular anatomy. RADIATION DOSE REDUCTION: This exam was performed according to the departmental dose-optimization program which includes automated exposure control, adjustment of the mA and/or kV according to patient size and/or use of iterative reconstruction technique. CONTRAST:  46mL OMNIPAQUE IOHEXOL 350 MG/ML SOLN COMPARISON:  Chest radiographs done earlier today, previous CT done on 04/11/2003 FINDINGS: Cardiovascular: There are intraluminal filling defects in few segmental and subsegmental branches in right upper lobe and right lower lobe. There is small thrombus burden. There is no demonstrable saddle embolus. RV LV ratio is 1.13. there is homogeneous enhancement in thoracic aorta. Mediastinum/Nodes: No significant lymphadenopathy is seen. Small pericardial effusion is present. Lungs/Pleura: Breathing motion limits evaluation. Small patchy ground-glass densities seen in both lungs. There is no focal consolidation. Left hemidiaphragm is elevated. There is no pleural effusion or pneumothorax. Upper Abdomen: Surgical staples are seen in stomach suggesting possible previous bariatric surgery. There is reflux of contrast into hepatic veins suggesting right heart dysfunction. Musculoskeletal: No acute findings are seen. Review of the MIP images confirms the above findings. IMPRESSION: Acute pulmonary embolism with small thrombus burden. RV LV ratio is 1.13. It is unlikely that this small thrombus burden PE is causing it. There is probably underlying chronic right heart dysfunction. Small pericardial effusion is seen. There is no evidence of thoracic aortic dissection. There is no focal consolidation. There is no pleural effusion or  pneumothorax. There are small faint foci of ground-glass densities in both lungs, possibly suggesting scarring or early interstitial pneumonia. Imaging finding of small thrombus burden PE was relayed to patient's provider Dr. Tommye Standard by telephone call. Electronically Signed   By: Elmer Picker M.D.   On: 06/01/2022 17:35   CT HEAD WO CONTRAST (5MM)  Result Date: 06/01/2022 CLINICAL DATA:  Syncope, trauma EXAM: CT HEAD WITHOUT CONTRAST TECHNIQUE: Contiguous axial images were obtained from the base of the skull through the vertex without intravenous contrast. RADIATION DOSE REDUCTION: This exam was performed according to the departmental dose-optimization program which includes automated exposure control, adjustment of the mA and/or kV according to patient size and/or use of iterative reconstruction technique. COMPARISON:  05/25/2022 FINDINGS: Brain: No acute intracranial findings are seen in noncontrast CT brain. There are no signs of bleeding within the cranium. Ventricles are not dilated. Cortical sulci are prominent. There is no focal edema or mass effect. Vascular: Unremarkable. Skull: No fracture is seen in calvarium. Sinuses/Orbits: Unremarkable. Other: None. IMPRESSION: No acute intracranial findings are seen in noncontrast CT brain. No significant interval changes are noted. Electronically Signed   By: Elmer Picker M.D.   On: 06/01/2022 17:24   VAS [name] LOWER EXTREMITY VENOUS (DVT) (ONLY [facility] & [facility])  Result Date: 06/01/2022  Lower Venous DVT Study Patient Name:  YULA CROTWELL  Date of Exam:   06/01/2022 Medical Rec #: [id number]        Accession #:    [id number] Date of Birth: [birth year]         Patient Gender: F Patient Age:   1 years Exam Location:  [facility] Procedure:      VAS [name] LOWER EXTREMITY VENOUS (DVT) Referring Phys: Marda Stalker --------------------------------------------------------------------------------  Indications: Pain, and Swelling.  Comparison Study: Previous  exam on 05/05/18 was negative for DVT Performing Technologist: Rogelia Rohrer RVT, RDMS  Examination Guidelines: A complete evaluation includes B-mode imaging, spectral Doppler,  color Doppler, and power Doppler as needed of all accessible portions of each vessel. Bilateral testing is considered an integral part of a complete examination. Limited examinations for reoccurring indications may be performed as noted. The reflux portion of the exam is performed with the patient in reverse Trendelenburg.  +-----+---------------+---------+-----------+----------+--------------+ RIGHTCompressibilityPhasicitySpontaneityPropertiesThrombus Aging +-----+---------------+---------+-----------+----------+--------------+ CFV  Full           Yes      Yes                                 +-----+---------------+---------+-----------+----------+--------------+   +---------+---------------+---------+-----------+----------+--------------+ LEFT     CompressibilityPhasicitySpontaneityPropertiesThrombus Aging +---------+---------------+---------+-----------+----------+--------------+ CFV      Full           Yes      Yes                                 +---------+---------------+---------+-----------+----------+--------------+ SFJ      Full                                                        +---------+---------------+---------+-----------+----------+--------------+ FV Prox  Full           Yes      Yes                                 +---------+---------------+---------+-----------+----------+--------------+ FV Mid   Full           Yes      Yes                                 +---------+---------------+---------+-----------+----------+--------------+ FV DistalFull           Yes      Yes                                 +---------+---------------+---------+-----------+----------+--------------+ PFV      Full                                                         +---------+---------------+---------+-----------+----------+--------------+ POP      Full           Yes      Yes                                 +---------+---------------+---------+-----------+----------+--------------+ PTV      Full                                                        +---------+---------------+---------+-----------+----------+--------------+ PERO     Full                                                        +---------+---------------+---------+-----------+----------+--------------+  Summary: RIGHT: - No evidence of common femoral vein obstruction.  LEFT: - There is no evidence of deep vein thrombosis in the lower extremity.  - No cystic structure found in the popliteal fossa.  *See table(s) above for measurements and observations.    Preliminary    DG Ankle Complete Left  Result Date: 06/01/2022 CLINICAL DATA:  Left ankle pain after fall several days ago. EXAM: LEFT ANKLE COMPLETE - 3+ VIEW COMPARISON:  None Available. FINDINGS: There is no evidence of fracture, dislocation, or joint effusion. There is no evidence of arthropathy or other focal bone abnormality. Soft tissues are unremarkable. IMPRESSION: Negative. Electronically Signed   By: Lupita RaiderJames  Green Jr M.D.   On: 06/01/2022 15:04   DG Chest 1 View  Result Date: 06/01/2022 CLINICAL DATA:  Provided history: Cough.  Shortness of breath. EXAM: CHEST  1 VIEW COMPARISON:  Prior chest radiographs 05/25/2022 and earlier. FINDINGS: The cardiomediastinal silhouette is unchanged. Left perihilar atelectasis. Chronic elevation of the left hemidiaphragm, limiting evaluation of the left lung base. Additionally, superimposition of the right hemidiaphragm limits evaluation of the right lung base. Within described limitations, there is no appreciable airspace consolidation. No evidence of pleural effusion or pneumothorax. Degenerative changes of the spine. IMPRESSION: Chronic elevation of the left hemidiaphragm, limiting  evaluation of the left lung base. Additionally, superimposition of the right hemidiaphragm limits evaluation of the right lung base. Within described limitations, there is no appreciable airspace consolidation. However, consider a lateral view radiograph for further evaluation. Left perihilar atelectasis. Electronically Signed   By: Jackey LogeKyle  Golden D.O.   On: 06/01/2022 13:39    Pending Labs Unresulted Labs (From admission, onward)     Start     Ordered   06/02/22 0800  Cortisol-am, blood  Once-Timed,   TIMED        06/01/22 2022   06/02/22 0500  CBC  Daily,   R      06/01/22 1750   06/01/22 1356  Lactic acid, plasma  Now then every 2 hours,   R      06/01/22 1357            Vitals/Pain Today's Vitals   06/02/22 0600 06/02/22 1027 06/02/22 1100 06/02/22 1145  BP: 106/67 105/69 105/64 110/76  Pulse: 72 85  85  Resp: 15 12 12 16   Temp: 98 F (36.7 C) 98.1 F (36.7 C)  97.8 F (36.6 C)  TempSrc: Oral Oral  Oral  SpO2: 95% 95%  100%  Weight:      Height:      PainSc:    6     Isolation Precautions No active isolations  Medications Medications  tapentadol (NUCYNTA) tablet 75 mg (75 mg Oral Given 06/02/22 1142)  bisacodyl (DULCOLAX) suppository 10 mg (has no administration in time range)  linaclotide (LINZESS) capsule 290 mcg (290 mcg Oral Patient Refused/Not Given 06/02/22 0748)  polyethylene glycol (MIRALAX / GLYCOLAX) packet 17 g (17 g Oral Not Given 06/02/22 1136)  azelastine (ASTELIN) 0.1 % nasal spray 2 spray (2 sprays Each Nare Given 06/02/22 1130)  fluticasone (FLONASE) 50 MCG/ACT nasal spray 1 spray (has no administration in time range)  melatonin tablet 3 mg (3 mg Oral Given 06/01/22 2255)  lidocaine (LIDODERM) 5 % 1 patch (1 patch Transdermal Patch Applied 06/02/22 1134)  diclofenac Sodium (VOLTAREN) 1 % topical gel 4 g (4 g Topical Given 06/02/22 1132)  apixaban (ELIQUIS) tablet 10 mg (10 mg Oral Given 06/02/22 1125)    Followed by  apixaban (ELIQUIS) tablet 5 mg (has no  administration in time range)  divalproex (DEPAKOTE ER) 24 hr tablet 1,000 mg (1,000 mg Oral Given 06/02/22 1136)  acetaminophen (TYLENOL) tablet 1,000 mg (has no administration in time range)  sodium chloride 0.9 % bolus 500 mL (0 mLs Intravenous Stopped 06/01/22 1745)  iohexol (OMNIPAQUE) 350 MG/ML injection 75 mL (75 mLs Intravenous Contrast Given 06/01/22 1717)  heparin bolus via infusion 4,000 Units (4,000 Units Intravenous Bolus from Bag 06/01/22 1857)  sodium chloride 0.9 % bolus 1,000 mL (0 mLs Intravenous Stopped 06/02/22 0017)    Mobility walks Moderate fall risk      R Recommendations: See Admitting Provider Note  Report given to:   Additional Notes:

## 2022-06-02 NOTE — ED Notes (Signed)
Patient is resting comfortably. 

## 2022-06-02 NOTE — Progress Notes (Addendum)
Daily Progress Note Intern Pager: [phone]  Patient name: [name] Medical record number: [id number] Date of birth: 06/07/1965 Age: 57 y.o. Gender: female  Primary Care Provider: Berkley Harvey, NP Consultants: None Code Status: Full code, however no life saving measures after 2 days of non-responsiveness  Pt Overview and Major Events to Date:  1/10 - Admitted after syncope, PE found on CTA, ankle XR unremarkable  Assessment and Plan: [name] is a 57 y.o. female presenting with syncope and incidentally found to have pulmonary embolism during workup. Patient recently discharged from the hospital for syncope w/ negative workup.  Pertinent PMH/PSH includes vasovagal syncope, fall on 05/29/22, T2DM, HTN, bipolar disorder, graves disease, RA, and s/p gastric bypass in 2008.  * Pulmonary embolism [facility]) Patient presents w/ PE, found after workup for syncope and soft BP.  Patient presently on RA w/ comfortable work of breathing and without dyspnea.  Negative DVT US on admission, waiting on echo results.  Patient noted by pharmacy to be taking estrace at home, which is possible provoking event for this PE.  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  Can discuss risks/benefits outpatient. - Transition to Eliquis from heparin gtt, loading dose of 10 mg BID for 1 week, then 5 mg BID indefinitely - f/u echo - d/c home estrace given pro-thrombotic effects - Continuous telemetry and pulse ox - AM CBC and BMP  Acute left ankle pain Still unable to bear weight and left medial malleolus remains TTP on exam.  XR yesterday unremarkable.  Instability and lack of weight bearing contributes to falls risk, which is significant given recent fall.  At minimum, patient would likely benefit from boot and/or crutches.  Will have PT and OT evaluate prior to discharge.  Consider MRI outpatient if not improving to evaluate soft  tissue injury; further imaging now would not change management inpatient. - Continue to monitor - Lidocaine patch, Voltaren gel, ice packs for pain - PT and OT evaluation  Type 2 diabetes mellitus without complications ([place]) Patient on 10 mg mounjaro weekly. Will hold while hospitalized. Last A1c 5.3 05/25/22. - Continue to monitor - Consider CBG checks and SSI  Syncope BP continues to be soft, but improved from overnight.  Echo results pending.  Though PE could have incited syncopal episode, extensive syncope history indicates PE unlikely to be sole cause.  Differential also includes adrenal insufficiency, will recheck cortisol level in the morning. - s/p NS bolus - Repeat AM cortisol level tomorrow at 8 am -f/u echo - Continue to monitor BP - Consider midodrine if BP remains too low  Bipolar disorder (HCC) Currently on Depakote and Doxepin. Doexpin held during last admission for concern of anticholinergic effects causing syncope. Unsure of role of Doxepin in syncope, will hold doxepin in setting of syncope.  Patient agrees that Doxepin is sedating for her. - Continue Depakote, hold Doxepin - Melatonin PRN  Graves' disease Patient w/ hx of graves, s/p radioactive iodine therapy. TSH slightly elevated on admission, to 4.687. Unsure etiology of elevation, may be in setting of recent illness. Will recheck outpatient.  FEN/GI: Carb Modified diet  PPx: Heparin drip Dispo: Pending PT recommendations  following clinical improvement. Barriers include PE treatment and workup of syncope.   Subjective:  This morning, patient expresses worry about her medical condition but is overall feeling okay.  She denies chest pain, shortness of breath, pleuritic discomfort.  She  has been using the bedside commode and when she stands to bear weight on left foot/ankle still.  Notes that lidocaine patch has helped with pain at home but she was still unable to bear weight.  Appetite has been  fine.  Objective: Temp:  [97.8 F (36.6 C)-98.8 F (37.1 C)] 98.8 F (37.1 C) (01/10 1231) Pulse Rate:  [72-91] 88 (01/10 1231) Resp:  [12-25] 19 (01/10 1231) BP: (88-140)/(35-84) 127/69 (01/10 1231) SpO2:  [93 %-100 %] 96 % (01/10 1231)  Vitals:   06/01/22 1642 06/01/22 1736 06/01/22 1806 06/01/22 1836  BP: (!) 101/54 (!) 95/56 (!) 94/59 112/84   06/01/22 2257 06/02/22 0000 06/02/22 0100 06/02/22 0600  BP: 106/70 (!) 92/50 (!) 88/48 106/67   06/02/22 1027 06/02/22 1100 06/02/22 1145 06/02/22 1231  BP: 105/69 105/64 110/76 127/69   Physical Exam: General: Concerned affect but no acute distress.  Resting in bed.  Alert and oriented. Cardiovascular: Regular rate and rhythm. Normal S1/S2. No murmurs, rubs, or gallops appreciated. 2+ radial pulses. Pulmonary: Clear bilaterally to ascultation. No increased WOB, no accessory muscle usage. No wheezes, rales, or crackles. Abdominal: No tenderness to deep or light palpation. No rebound or guarding. Skin: Warm and dry. Extremities: Left ankle and foot swelling over both dorsal and plantar surfaces up to base of toes.  Mild ecchymosis and erythema of arch of foot.  Generalized TTP, most significant over medial malleolus and plantar surface of foot.  Laboratory: Most recent CBC Lab Results  Component Value Date   WBC 6.2 06/02/2022   HGB 10.5 (L) 06/02/2022   HCT 33.1 (L) 06/02/2022   MCV 92.5 06/02/2022   PLT 191 06/02/2022   Most recent BMP    Latest Ref Rng & Units 06/02/2022    5:22 AM  BMP  Glucose 70 - 99 mg/dL 79   BUN 6 - 20 mg/dL 20   Creatinine 0.44 - 1.00 mg/dL 0.91   Sodium 135 - 145 mmol/L 137   Potassium 3.5 - 5.1 mmol/L 4.1   Chloride 98 - 111 mmol/L 105   CO2 22 - 32 mmol/L 24   Calcium 8.9 - 10.3 mg/dL 7.8     Other pertinent labs: - AM Cortisol: Obtained too early in AM, will repeat tomorrow        Component Ref Range & Units 05:22 01:50 00:35  Heparin Unfractionated 0.30 - 0.70 IU/mL 0.72 High  0.94  High  CM >1.10 High    Imaging/Diagnostic Tests: - Left Ankle XR: Unremarkable - Echo: Pending  Dimitri Shitarev MS4, [employer], [place] Intern pager: [phone], text pages welcome  I was personally present and re-performed the exam and medical decision making and verified the service and findings are accurately documented in the student's note.  Precious Gilding, DO 06/02/2022 2:04 PM  Secure chat group [facility] Teaching Service

## 2022-06-03 ENCOUNTER — Other Ambulatory Visit: Payer: Self-pay | Admitting: Cardiology

## 2022-06-03 ENCOUNTER — Telehealth (HOSPITAL_COMMUNITY): Payer: Medicare Other | Admitting: Psychiatry

## 2022-06-03 DIAGNOSIS — R55 Syncope and collapse: Secondary | ICD-10-CM | POA: Diagnosis not present

## 2022-06-03 LAB — CBC
HCT: 34.5 % — ABNORMAL LOW (ref 36.0–46.0)
Hemoglobin: 10.9 g/dL — ABNORMAL LOW (ref 12.0–15.0)
MCH: 28.8 pg (ref 26.0–34.0)
MCHC: 31.6 g/dL (ref 30.0–36.0)
MCV: 91 fL (ref 80.0–100.0)
Platelets: 208 10*3/uL (ref 150–400)
RBC: 3.79 MIL/uL — ABNORMAL LOW (ref 3.87–5.11)
RDW: 14.6 % (ref 11.5–15.5)
WBC: 5.3 10*3/uL (ref 4.0–10.5)
nRBC: 0 % (ref 0.0–0.2)

## 2022-06-03 LAB — CORTISOL-AM, BLOOD: Cortisol - AM: 8 ug/dL (ref 6.7–22.6)

## 2022-06-03 MED ORDER — HYDROXYZINE HCL 10 MG PO TABS
10.0000 mg | ORAL_TABLET | Freq: Every day | ORAL | Status: DC
  Administered 2022-06-03: 10 mg via ORAL
  Filled 2022-06-03: qty 1

## 2022-06-03 MED ORDER — COSYNTROPIN 0.25 MG IJ SOLR
0.2500 mg | Freq: Once | INTRAMUSCULAR | Status: AC
  Administered 2022-06-04: 0.25 mg via INTRAVENOUS
  Filled 2022-06-03: qty 0.25

## 2022-06-03 MED ORDER — ACETAMINOPHEN 325 MG PO TABS
650.0000 mg | ORAL_TABLET | Freq: Once | ORAL | Status: AC
  Administered 2022-06-03: 650 mg via ORAL
  Filled 2022-06-03: qty 2

## 2022-06-03 MED ORDER — MELATONIN 3 MG PO TABS
3.0000 mg | ORAL_TABLET | Freq: Once | ORAL | Status: AC
  Administered 2022-06-03: 3 mg via ORAL

## 2022-06-03 MED ORDER — HYDROXYZINE HCL 10 MG PO TABS
10.0000 mg | ORAL_TABLET | Freq: Once | ORAL | Status: AC
  Administered 2022-06-03: 10 mg via ORAL

## 2022-06-03 MED ORDER — MELATONIN 3 MG PO TABS
3.0000 mg | ORAL_TABLET | Freq: Every day | ORAL | Status: DC
  Administered 2022-06-03: 3 mg via ORAL
  Filled 2022-06-03: qty 1

## 2022-06-03 NOTE — Progress Notes (Signed)
Orthostatic BPs BPs  HR  Supine 118/62 89  Sitting 107/81 88  Standing 115/85 94  Standing after 1 min 107/60 95    [name] Mobility Specialist  Please contact via Solicitor or Rehab office at [phone]

## 2022-06-03 NOTE — Evaluation (Signed)
Occupational Therapy Evaluation Patient Details Name: [name] MRN: [id number] DOB: [birth year] Today's Date: 06/03/2022   History of Present Illness 57 y.o. female admitted 06/01/2022 with recurrent syncope and found PE, cleared DVT on LLE.  Pt thinks she fell on LLE when she passed out.   PMH includes RA, migraines, OSA, vasovagal syncope, Graves' disease, vertigo, bipolar, chronic fatigue syndrome, HLD, gastric bypass, DM, HTN,   Clinical Impression   Pt typically furniture walks and is assisted for IADLs. She reports her tub bench does not fit in her bathroom and has plans to renovate and make her bathroom completely accessible. Pt presents with L ankle pain and impaired standing balance. She requires up to supervision for mobility and set up to min assist for ADLs. Recommending HHOT.      Recommendations for follow up therapy are one component of a multi-disciplinary discharge planning process, led by the attending physician.  Recommendations may be updated based on patient status, additional functional criteria and insurance authorization.   Follow Up Recommendations  Home health OT     Assistance Recommended at Discharge Frequent or constant Supervision/Assistance  Patient can return home with the following A little help with walking and/or transfers;Assistance with cooking/housework;Assistance with feeding;Help with stairs or ramp for entrance;Assist for transportation;A lot of help with bathing/dressing/bathroom    Functional Status Assessment  Patient has had a recent decline in their functional status and demonstrates the ability to make significant improvements in function in a reasonable and predictable amount of time.  Equipment Recommendations  None recommended by OT    Recommendations for Other Services       Precautions / Restrictions Precautions Precautions: Fall Precaution Comments: L ankle pain Restrictions Weight Bearing Restrictions: No      Mobility  Bed Mobility Overal bed mobility: Modified Independent                  Transfers Overall transfer level: Needs assistance Equipment used: Rolling walker (2 wheels) Transfers: Sit to/from Stand Sit to Stand: Supervision                  Balance Overall balance assessment: Needs assistance   Sitting balance-Leahy Scale: Good     Standing balance support: Bilateral upper extremity supported, During functional activity Standing balance-Leahy Scale: Poor Standing balance comment: unable to release walker in static standing due to ankle pain                           ADL either performed or assessed with clinical judgement   ADL Overall ADL's : Needs assistance/impaired Eating/Feeding: Independent;Bed level   Grooming: Set up;Sitting   Upper Body Bathing: Set up;Sitting   Lower Body Bathing: Sit to/from stand;Minimal assistance   Upper Body Dressing : Set up;Sitting   Lower Body Dressing: Sit to/from stand;Minimal assistance   Toilet Transfer: Ambulation;Rolling walker (2 wheels);Supervision/safety   Toileting- Clothing Manipulation and Hygiene: Supervision/safety;Sitting/lateral lean       Functional mobility during ADLs: Rolling walker (2 wheels);Supervision/safety       Vision Patient Visual Report: No change from baseline       Perception     Praxis      Pertinent Vitals/Pain Pain Assessment Pain Assessment: Faces Faces Pain Scale: Hurts whole lot Pain Location: L ankle Pain Descriptors / Indicators: Guarding, Grimacing, Tender Pain Intervention(s): Monitored during session, Repositioned     Hand Dominance Right   Extremity/Trunk Assessment Upper Extremity Assessment Upper Extremity  Assessment: Overall WFL for tasks assessed   Lower Extremity Assessment Lower Extremity Assessment: Defer to PT evaluation       Communication Communication Communication: No difficulties   Cognition Arousal/Alertness:  Awake/alert Behavior During Therapy: WFL for tasks assessed/performed Overall Cognitive Status: Within Functional Limits for tasks assessed                                       General Comments       Exercises     Shoulder Instructions      Home Living Family/patient expects to be discharged to:: Private residence Living Arrangements: Children Available Help at Discharge: Family;Available PRN/intermittently Type of Home: House Home Access: Stairs to enter [employer] of Steps: 2 Entrance Stairs-Rails: None Home Layout: One level     Bathroom Shower/Tub: Teacher, early years/pre: Standard     Home Equipment: Tub bench   Additional Comments: tub bench unhelpful with garden tub      Prior Functioning/Environment Prior Level of Function : Needs assist             Mobility Comments: hx of fall, holds furniture ADLs Comments: pt requires intermittent assistance with IADLs        OT Problem List: Decreased activity tolerance;Impaired balance (sitting and/or standing);Decreased knowledge of use of DME or AE;Pain      OT Treatment/Interventions: Self-care/ADL training;Patient/family education;Balance training;Therapeutic activities;DME and/or AE instruction    OT Goals(Current goals can be found in the care plan section) Acute Rehab OT Goals OT Goal Formulation: With patient Time For Goal Achievement: 06/17/22 Potential to Achieve Goals: Good ADL Goals Pt Will Perform Grooming: with modified independence;standing Pt Will Perform Lower Body Bathing: with modified independence;sit to/from stand Pt Will Perform Lower Body Dressing: with modified independence;sit to/from stand Pt Will Transfer to Toilet: with modified independence;ambulating;regular height toilet Pt Will Perform Toileting - Clothing Manipulation and hygiene: with modified independence;sit to/from stand  OT Frequency: Min 2X/week    Co-evaluation               AM-PAC OT "6 Clicks" Daily Activity     Outcome Measure Help from another person eating meals?: None Help from another person taking care of personal grooming?: A Little Help from another person toileting, which includes using toliet, bedpan, or urinal?: A Little Help from another person bathing (including washing, rinsing, drying)?: A Lot Help from another person to put on and taking off regular upper body clothing?: A Little Help from another person to put on and taking off regular lower body clothing?: A Lot 6 Click Score: 17   End of Session Equipment Utilized During Treatment: Gait belt;Rolling walker (2 wheels)  Activity Tolerance: Patient limited by pain Patient left: in bed;with call bell/phone within reach;with bed alarm set  OT Visit Diagnosis: Unsteadiness on feet (R26.81);Other abnormalities of gait and mobility (R26.89);Pain;Muscle weakness (generalized) (M62.81)                Time: 7262-0355 OT Time Calculation (min): 19 min Charges:  OT General Charges $OT Visit: 1 Visit OT Evaluation $OT Eval Moderate Complexity: [place], OTR/L Acute Rehabilitation Services Office: [phone]   Malka So 06/03/2022, 4:02 PM

## 2022-06-03 NOTE — Evaluation (Signed)
Physical Therapy Evaluation Patient Details Name: [name] MRN: [id number] DOB: 14-Sep-1965 Today's Date: 06/03/2022  History of Present Illness  57 y.o. female admitted 06/01/2022 with recurrent syncope and found PE, cleared DVT on LLE.  Pt thinks she fell on LLE when she passed out.   PMH includes RA, migraines, OSA, vasovagal syncope, Graves' disease, vertigo, bipolar, chronic fatigue syndrome, HLD, gastric bypass, DM, HTN,  Clinical Impression  Pt was seen for mobility on RW after noting edema and pain restricting DF and WB on LLE.  Pt is up to walk on toes of L foot, and is WB a significant amount to complete walking in her room.  Follow along with her to strengthen LE's, to increase balance with AD, to increase independence with walker and to plan her discharge.  For now recommending her to go to HHPT, for pt to get RW and for pt to practice stairs pre-discharge to home for safety of entering home.  Follow for acute PT goals as are outlined below.       Recommendations for follow up therapy are one component of a multi-disciplinary discharge planning process, led by the attending physician.  Recommendations may be updated based on patient status, additional functional criteria and insurance authorization.  Follow Up Recommendations Home health PT      Assistance Recommended at Discharge Intermittent Supervision/Assistance  Patient can return home with the following  A little help with walking and/or transfers;A little help with bathing/dressing/bathroom;Assistance with cooking/housework;Assist for transportation;Help with stairs or ramp for entrance    Equipment Recommendations Rolling walker (2 wheels)  Recommendations for Other Services       Functional Status Assessment Patient has had a recent decline in their functional status and demonstrates the ability to make significant improvements in function in a reasonable and predictable amount of time.     Precautions /  Restrictions Precautions Precautions: Fall Precaution Comments: L ankle pain Restrictions Weight Bearing Restrictions: No      Mobility  Bed Mobility Overal bed mobility: Modified Independent Bed Mobility: Supine to Sit, Sit to Supine     Supine to sit: Modified independent (Device/Increase time) Sit to supine: Modified independent (Device/Increase time)        Transfers Overall transfer level: Needs assistance Equipment used: Rolling walker (2 wheels) Transfers: Sit to/from Stand Sit to Stand: Supervision                Ambulation/Gait Ambulation/Gait assistance: Supervision Gait Distance (Feet): 60 Feet (mult turns in the room and stepping back to reach for line to open window shades) Assistive device: Rolling walker (2 wheels), None Gait Pattern/deviations: Step-through pattern, Antalgic Gait velocity: reduced Gait velocity interpretation: <1.31 ft/sec, indicative of household ambulator Pre-gait activities: standing balance ck General Gait Details: past chart history reads that pt will want SPC instead of RW but is demonstrating use of RW without issues  Stairs            Wheelchair Mobility    Modified Rankin (Stroke Patients Only)       Balance Overall balance assessment: Needs assistance Sitting-balance support: Feet supported Sitting balance-Leahy Scale: Good     Standing balance support: Bilateral upper extremity supported, During functional activity Standing balance-Leahy Scale: Fair Standing balance comment: fair with RW                             Pertinent Vitals/Pain Pain Assessment Pain Assessment: Faces Faces Pain Scale: Hurts  whole lot Pain Location: L ankle Pain Descriptors / Indicators: Guarding, Grimacing, Tender Pain Intervention(s): Limited activity within patient's tolerance, Monitored during session, Premedicated before session, Repositioned    Home Living Family/patient expects to be discharged to::  Private residence Living Arrangements: Children Available Help at Discharge: Family;Available PRN/intermittently Type of Home: House Home Access: Stairs to enter Entrance Stairs-Rails: None Entrance Stairs-Number of Steps: 2   Home Layout: One level Home Equipment: Tub bench Additional Comments: tub bench unhelpful with garden tub    Prior Function Prior Level of Function : Needs assist       Physical Assist : Mobility (physical) Mobility (physical): Gait   Mobility Comments: hx of falls and furniture walks, requires RW now but was waiting for [facility] previously       Hand Dominance   Dominant Hand: Right    Extremity/Trunk Assessment   Upper Extremity Assessment Upper Extremity Assessment: Overall WFL for tasks assessed    Lower Extremity Assessment Lower Extremity Assessment: LLE deficits/detail LLE Deficits / Details: PF posture ankle and cannot allow PT to touch it LLE: Unable to fully assess due to pain LLE Coordination: decreased gross motor    Cervical / Trunk Assessment Cervical / Trunk Assessment: Kyphotic  Communication   Communication: No difficulties  Cognition Arousal/Alertness: Awake/alert Behavior During Therapy: WFL for tasks assessed/performed Overall Cognitive Status: No family/caregiver present to determine baseline cognitive functioning Area of Impairment: Safety/judgement, Problem solving                         Safety/Judgement: Decreased awareness of deficits, Decreased awareness of safety   Problem Solving: Slow processing, Requires verbal cues General Comments: reviewed posture on L LE to wb before standing, reviewed plan with her as she became impatient seeming to forget the instructions        General Comments General comments (skin integrity, edema, etc.): pt reportedly did not want to use RW due to feeling it was for a "disabled" person but cannot wb on LLE due to ankle pain    Exercises     Assessment/Plan    PT  Assessment Patient needs continued PT services  PT Problem List Decreased strength;Decreased balance;Pain;Decreased knowledge of use of DME;Decreased skin integrity;Decreased coordination       PT Treatment Interventions Gait training;DME instruction;Functional mobility training;Therapeutic activities;Stair training;Therapeutic exercise;Balance training;Neuromuscular re-education;Patient/family education    PT Goals (Current goals can be found in the Care Plan section)  Acute Rehab PT Goals Patient Stated Goal: to be able to walk on LLE PT Goal Formulation: With patient Time For Goal Achievement: 06/10/22 Potential to Achieve Goals: Fair    Frequency Min 3X/week     Co-evaluation               AM-PAC PT "6 Clicks" Mobility  Outcome Measure Help needed turning from your back to your side while in a flat bed without using bedrails?: None Help needed moving from lying on your back to sitting on the side of a flat bed without using bedrails?: A Little Help needed moving to and from a bed to a chair (including a wheelchair)?: A Little Help needed standing up from a chair using your arms (e.g., wheelchair or bedside chair)?: A Little Help needed to walk in hospital room?: A Little Help needed climbing 3-5 steps with a railing? : A Lot 6 Click Score: 18    End of Session Equipment Utilized During Treatment: Gait belt Activity Tolerance: Patient tolerated treatment  well Patient left: in bed;with call bell/phone within reach;with bed alarm set;Other (comment) Nurse Communication: Mobility status;Patient requests pain meds PT Visit Diagnosis: Other abnormalities of gait and mobility (R26.89);Muscle weakness (generalized) (M62.81);History of falling (Z91.81)    Time: 2725-3664 PT Time Calculation (min) (ACUTE ONLY): 26 min   Charges:   PT Evaluation $PT Eval Moderate Complexity: 1 Mod PT Treatments $Gait Training: 8-22 mins       Ivar Drape 06/03/2022, 12:43 PM  Samul Dada, PT PhD Acute Rehab Dept. Number: [facility] [id number] and [facility] [phone]

## 2022-06-03 NOTE — Progress Notes (Addendum)
Daily Progress Note Intern Pager: [phone]  Patient name: [name] Medical record number: [id number] Date of birth: [birth year] Age: 57 y.o. Gender: female  Primary Care Provider: Berkley Harvey, NP Consultants: Cardiology Code Status: Full code, however no life saving measures after 2 days of non-responsiveness.  Pt Overview and Major Events to Date:  1/10 - Admitted for syncope, PE found on CTA, ankle XR unremarkable 1/11 - AM cortisol indeterminate, cardiology consulted  Assessment and Plan: [name] is a 57 y.o. female presenting with syncope and incidentally found to have pulmonary embolism during workup.  Patient discharged from hospital last week for syncope w/ negative workup.   Pertinent PMH/PSH includes vasovagal syncope, fall on 05/29/22, T2DM, HTN, bipolar disorder, graves disease, RA, and s/p gastric bypass in 2008.  * Pulmonary embolism [facility]) Patient presents w/ PE, found after workup for syncope and soft BP.  Patient presently on RA w/ comfortable work of breathing and without dyspnea.  Negative DVT US on admission, echo unremarkable (EF 55-60%).  Patient noted by pharmacy yesterday to be taking estrace at home (now discontinued), which is a possible provoking event for this PE.  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  Can discuss risks/benefits of lifelong anticoagulation outpatient for shared decision making. - Continue Eliquis; loading dose of 10 mg BID for 1 week, then 5 mg BID indefinitely - Provided education on bleeding risk on Eliquis in the event of syncope and fall, instructed patient to present to ED after serious fall - Provided education on thrombotic risks of estrogen, explained that Estrace has been D/Ced - Continuous telemetry and pulse ox  Acute left ankle pain Swelling and pain mildly improved.  Left medial malleolus remains TTP on exam and still struggles to bear  weight.  Instability and lack of weight bearing contributes to falls risk, which is significant given recent fall.  At minimum, patient would likely benefit from boot and/or crutches.  PT and OT to evaluate later today.  XR ankle/foot unremarkable.  Consider MRI outpatient if not improving to evaluate soft tissue injury; further imaging now would not change management inpatient, particularly shortly after injury. - Continue to monitor - Lidocaine patch, Voltaren gel, ice packs for pain - PT and OT evaluation  Type 2 diabetes mellitus without complications ([place]) Patient on 10 mg mounjaro weekly. Will hold while hospitalized. Last A1c 5.3 05/25/22. - Continue to monitor - Consider CBG checks and SSI  Syncope BP soft overnight but better in AM (107/61>121/63).  Echo largely unremarkable (EF 55-60%).  Given left foot injury with poor weight bearing and newly started Eliquis, the potential sequelae of a fall are now increased for this patient.  Will consult cardiology to discuss improving hypotension.  Though PE could have incited syncopal episode, extensive syncope history indicates PE unlikely to be sole cause.  Differential also includes adrenal insufficiency, and patient's AM cortisol was within indeterminate range of 3-18.  ACTH stim test will be conducted.  Of note, patient does not have a history of hyperkalemia expected with AI.  TSH not in hypothyroid range. - f/u ACTH stim test - Continue to monitor BP - Check orthostatic BP - Consider midodrine if BP consistently low - Discuss falls and syncope mitigation with patient; abdominal binder, good hydration - Cardiology consult  Bipolar disorder ([place]) Currently on Depakote and Doxepin. Doexpin held during last admission for concern of anticholinergic effects causing syncope. Unsure of  role of Doxepin in syncope, will hold doxepin in setting of syncope.  Patient agrees that Doxepin is quite sedating for her, but has had difficulty sleeping last  night.  Believes her anxiety is keeping her awake.  Likely slept only a few hours.  Notably, patient only received melatonin and hydroxyzine at 1 AM overnight, will seek to offer earlier tonight to facilitate better sleep and reduce nighttime anxiety. - Continue Depakote, hold Doxepin - Melatonin and hydroxyzine PRN, ask RN to offer earlier in evening  Graves' disease Patient w/ hx of graves, s/p radioactive iodine therapy. TSH slightly elevated on admission, to 4.687. Unsure etiology of elevation, may be in setting of recent illness. Will recheck outpatient.  FEN/GI: Carb Modified diet  PPx: Heparin drip Dispo: Pending PT recommendations  following clinical improvement. Barriers include PE treatment and workup of syncope.   Subjective:  This morning, patient feels poorly because she only slept a few hours overnight stating that she could not fall asleep until 5 AM.  She believes this is because she is very anxious about her current PE and her nerves are getting the best of her.  Attempted to reassure patient and discussed her stable condition and good clinical progression.  Because she did not sleep, patient also has a headache and generally does not feel well.  She was able to eat half her breakfast this morning and has been hydrating well.  She does deny shortness of breath at rest and only has mild shortness of breath with ambulation.  She also denies chest pain or pleuritic discomfort.  She has noticed that her left ankle swelling has gone down and she is able to bear slightly more weight on it though still has to stand on her tippy toes.  Objective: Temp:  [98 F (36.7 C)-98.8 F (37.1 C)] 98.1 F (36.7 C) (01/11 1130) Pulse Rate:  [79-92] 86 (01/11 1130) Resp:  [10-20] 17 (01/11 1130) BP: (98-136)/(59-80) 115/62 (01/11 1130) SpO2:  [94 %-99 %] 99 % (01/11 1130) Weight:  [91.5 kg] 91.5 kg (01/10 1231)  Physical Exam: General: Tired and stress-appearing. Mildly drowsy and mumbles.  Resting in bed, no acute distress. Alert and oriented x4. HEENT: PERRLA. No conjunctival erythema or scleral injections. Cardiovascular: Regular rate and rhythm. Normal S1/S2. No murmurs, rubs, or gallops appreciated. 2+ radial pulses. Pulmonary: No increased WOB, no accessory muscle usage. No wheezes, rales, or crackles bilaterally. Abdominal: No tenderness to deep or light palpation. No rebound or guarding. Skin: Warm and dry, no rashes grossly. Extremities: Left ankle and foot swelling (dorsal and plantar) to metatarsals, mildly improved from yesterday.  TTP over medial malleolus and generally around ankle and plantar surface of foot, particularly at the base of 4th and 5th metatarsals.  Laboratory: Most recent CBC Lab Results  Component Value Date   WBC 5.3 06/03/2022   HGB 10.9 (L) 06/03/2022   HCT 34.5 (L) 06/03/2022   MCV 91.0 06/03/2022   PLT 208 06/03/2022   Most recent BMP    Latest Ref Rng & Units 06/02/2022    5:22 AM  BMP  Glucose 70 - 99 mg/dL 79   BUN 6 - 20 mg/dL 20   Creatinine 0.44 - 1.00 mg/dL 0.91   Sodium 135 - 145 mmol/L 137   Potassium 3.5 - 5.1 mmol/L 4.1   Chloride 98 - 111 mmol/L 105   CO2 22 - 32 mmol/L 24   Calcium 8.9 - 10.3 mg/dL 7.8     Other pertinent labs: -  AM cortisol: 8.0; within indeterminate range of 3-18 - ACTH stimulation test: Pending for 1/12  Imaging/Diagnostic Tests: - Echocardiogram: RVSP 21.46mmHg, otherwise unremarkable with 55-60% EF.  Shitarev, Dimitry, Medical Student 06/03/2022, 12:01 PM  I was personally present and performed or re-performed the history, physical exam and medical decision making activities of this service and have verified that the service and findings are accurately documented in the student's note.  Bess Kinds, MD                  06/03/2022, 2:46 PM   MS4, [facility], [facility] Family [facility] FPTS Intern pager: [phone], text pages welcome Secure chat group [facility] Teaching  Service

## 2022-06-03 NOTE — Discharge Instructions (Addendum)
Thank you for letting us care for you during your stay!  You were admitted to the [facility] Medicine Teaching Service. You were admitted for syncope (fainting).  We found the following during your stay: Pulmonary embolism (clot in your lungs) and started you on a new blood thinner (Eliquis).  Medication changes: Please continue taking 10 mg of Eliquis twice daily for 4 more days after today and then switch to 5 mg twice daily.  STOP taking the Eliquis after 25 days total. Discuss with your PCP if you need to take any more of it. Please STOP taking your estrogen (Estrace), as that increases the risk of another clot. We are recommending that you STOP your Doxepin and Nucynta because they increase your fall risk and you are on the blood thinner Eliquis. This is a big deal. We understand that stopping these medications will make you uncomfortable, but falling and getting a brain bleed could carry risk of death. Please do NOT take advil or ibuprofen because these increase your risk of bleeding on Eliquis.  Other important diagnoses identified during your stay were: left foot/ankle soft tissue injury. Your Xray did not show any fractures.  Please follow up with Your primary care physician in 1 week Your cardiologist on 06/18/22  We recommend PCP follow up specifically for discussion of your Eliquis duration and for your left foot/ankle pain.  We have ordered a rolling walker for you and you will have physical/occupational therapy visit you at home.  Please discuss a shower chair with them.  You can also purchase an abdominal binder to help with keeping your blood pressure up when you stand so you do not faint as easily.  If your symptoms worsen or return, please return to the hospital.  Also please got to the ED if you experience a fall and hit your head because you have a higher risk of bleeding on Eliquis.  Please let us know if you have questions about your stay at [facility] on my medicine - ELIQUIS (apixaban)  This medication education was reviewed with me or my healthcare representative as part of my discharge preparation.  The pharmacist that spoke with me during my hospital stay was:  Sandford Craze, [facility]  Why was Eliquis prescribed for you? Eliquis was prescribed for you to reduce the risk of forming blood clots that can cause a stroke if you have a medical condition called atrial fibrillation (a type of irregular heartbeat) OR to reduce the risk of a blood clots forming after orthopedic surgery.  What do You need to know about Eliquis ? Take your Eliquis TWICE DAILY - one tablet in the morning and one tablet in the evening with or without food.  It would be best to take the doses about the same time each day.  If you have difficulty swallowing the tablet whole please discuss with your pharmacist how to take the medication safely.  Take Eliquis exactly as prescribed by your doctor and DO NOT stop taking Eliquis without talking to the doctor who prescribed the medication.  Stopping may increase your risk of developing a new clot or stroke.  Refill your prescription before you run out.  After discharge, you should have regular check-up appointments with your healthcare provider that is prescribing your Eliquis.  In the future your dose may need to be changed if your kidney function or weight changes by a significant amount or as you get older.  What do you do if you  miss a dose? If you miss a dose, take it as soon as you remember on the same day and resume taking twice daily.  Do not take more than one dose of ELIQUIS at the same time.  Important Safety Information A possible side effect of Eliquis is bleeding. You should call your healthcare provider right away if you experience any of the following: Bleeding from an injury or your nose that does not stop. Unusual colored urine (red or dark brown) or unusual colored stools (red or  black). Unusual bruising for unknown reasons. A serious fall or if you hit your head (even if there is no bleeding).  Some medicines may interact with Eliquis and might increase your risk of bleeding or clotting while on Eliquis. To help avoid this, consult your healthcare provider or pharmacist prior to using any new prescription or non-prescription medications, including herbals, vitamins, non-steroidal anti-inflammatory drugs (NSAIDs) and supplements.  This website has more information on Eliquis (apixaban): http://www.eliquis.com/eliquis/home

## 2022-06-03 NOTE — Consult Note (Signed)
Cardiology Consultation   Patient ID: [name] MRN: [id number]; DOB: 05-May-1966  Admit date: 06/01/2022 Date of Consult: 06/03/2022  PCP:  Iona Hansen, NP   [facility] Providers Cardiologist:  Charlton Haws, MD   (new)  Patient Profile:   [name] is a 57 y.o. female with a hx of vasovagal syncope, diabetes, hypertension, bipolar, Graves' disease s/p radioactive iodine therapy, RA, history of gastric bypass '08 and newly diagnosed PE who is being seen 06/03/2022 for the evaluation of syncope at the request of Dr. Deirdre Priest.  History of Present Illness:   [name] 57 year old female with past medical history noted above.  She has not routinely been followed by cardiology.  She underwent a clear stress test in 2010 which was essentially normal.  She has a long history of syncope.  She currently lives at home and does most of her ADLs independently.  She uses a walker and a cane to get around.  She follows in a pain clinic for lower back pain/sciatica.  She has been treated with Nucynta and doxepin through this clinic.  Has had multiple admissions for syncope in the past.  She was most recently just admitted and discharged on 1/4 for syncope.  She presented back on 1/9 with recurrent episode.   On admission her labs showed sodium 137, potassium 3.9, creatinine 1.09, albumin 3.1, BNP 12, high-sensitivity troponin 5>> 3, WBC 6.8, hemoglobin 11.8, TSH 4.68.  EKG showed sinus rhythm, 93 bpm, T wave inversion in lead I, V1 V2.  CT angio positive for acute PE with small thrombus burden.  She was admitted to family medicine for further management.  Echocardiogram/10 showed LVEF of 55 to 60%, normal RV size and function, trivial MR.   She has been started on PE dose Eliquis.  During her admission her blood pressures have noted to be soft.  Recent orthostatics done the afternoon of 1/11 were negative.  Cardiology asked to assist with further evaluation of syncope.  In  talking with patient she is not very mobile currently as she is having significant left ankle swelling.  She is pending a ACTH stim test.  In review of her home medication she also reports taking phentermine.    Past Medical History:  Diagnosis Date   Acute kidney injury (HCC) 10/25/2020   Acute respiratory disease due to COVID-19 virus 10/25/2020   AKI (acute kidney injury) (HCC) 10/25/2020   Allergic rhinitis    Allergic to cats    pet dander   Allergy    Anemia    Anxiety    Arthritis 09/2018   both feet   Asthma    Back pain    Bipolar disorder (HCC)    Cervicalgia    Chronic fatigue syndrome    Chronic low back pain with left-sided sciatica    Class 3 severe obesity with serious comorbidity and body mass index (BMI) of 40.0 to 44.9 in adult [facility]) 11/14/2014   Constipation    Depression    Diabetes mellitus without complication (HCC)    "pre"   Dyspnea    Environmental allergies    Fibromyalgia    GERD (gastroesophageal reflux disease)    Grave's disease    Heart murmur    High cholesterol    History of blood clots    Hypertension    Joint pain    Lactose intolerance    Lower extremity edema    Multiple food allergies    OSA (obstructive  sleep apnea)    Osteoporosis    Palpitations    Panic disorder    Pharyngoesophageal dysphagia 06/24/2014   Prediabetes 05/11/2019   Protein-calorie malnutrition, mild (HCC) 06/23/2021   Rheumatoid arthritis (HCC)    Risk for falls    Sciatica    Severe 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 (HCC)    Sleep apnea    no cpap worn   Subclinical hypothyroidism 05/11/2019   Syncope    Syncope and collapse    Thyroid disease    Vasovagal syncope    Vertigo    Vitamin D deficiency     Past Surgical History:  Procedure Laterality Date   ABDOMINAL HYSTERECTOMY  2006   COLONOSCOPY     GASTRIC BYPASS  2008     Home Medications:  Prior to Admission medications   Medication Sig Start Date End Date  Taking? Authorizing Provider  albuterol (VENTOLIN HFA) 108 (90 Base) MCG/ACT inhaler Inhale 2 puffs into the lungs every 6 (six) hours as needed for wheezing. 12/20/19  Yes Whitmire, Dawn W, FNP  azelastine (ASTELIN) 0.1 % nasal spray Place 2 sprays into both nostrils daily as needed for allergies. Use in each nostril as directed   Yes [provider]  benzonatate (TESSALON) 100 MG capsule Take 100 mg by mouth 2 (two) times daily as needed for cough.   Yes [provider]  bisacodyl (DULCOLAX) 10 MG suppository Place 1 suppository (10 mg total) rectally as needed for moderate constipation. 01/25/22  Yes Sabas Sous, MD  divalproex (DEPAKOTE ER) 500 MG 24 hr tablet Take 2 tablets (1,000 mg total) by mouth daily. Patient taking differently: Take 500 mg by mouth 2 (two) times daily. 05/20/22  Yes Oletta Darter, MD  doxepin (SINEQUAN) 100 MG capsule Take 100 mg by mouth at bedtime.   Yes [provider]  EPINEPHrine (EPIPEN 2-PAK) 0.3 mg/0.3 mL IJ SOAJ injection Inject 0.3 mg into the muscle as needed for anaphylaxis.   Yes [provider]  estradiol (ESTRACE) 0.5 MG tablet Take 0.5 mg by mouth every evening. 07/20/19  Yes [provider]  Galcanezumab-gnlm (EMGALITY) 120 MG/ML SOAJ INJECT 120 MG INTO THE SKIN EVERY 30 DAYS Patient taking differently: Inject 120 mg into the skin every 30 (thirty) days. 1st Monday of every month 04/21/21  Yes Anson Fret, MD  ibuprofen (ADVIL) 800 MG tablet Take 1 tablet (800 mg total) by mouth every 8 (eight) hours as needed (pain). 04/09/22  Yes Banister, Janace Aris, MD  lidocaine (LIDODERM) 5 % Place 1-2 patches onto the skin daily as needed (pain). Remove & Discard patch within 12 hours or as directed by MD   Yes [provider]  linaclotide (LINZESS) 290 MCG CAPS capsule Take 1 capsule (290 mcg total) by mouth daily before breakfast. Patient taking differently: Take 290 mcg by mouth daily as needed  (constipation). 07/15/21  Yes Helane Rima, DO  MELATONIN PO Take 1 tablet by mouth at bedtime as needed (sleep).   Yes [provider]  MOUNJARO 10 MG/0.5ML Pen Inject 10 mg into the skin every Wednesday. 04/29/22  Yes [provider]  Multiple Vitamin (MULTIVITAMIN) tablet Take 1 tablet by mouth in the morning.   Yes [provider]  phentermine (ADIPEX-P) 37.5 MG tablet Take 18.75 mg by mouth in the morning.   Yes [provider]  tapentadol HCl (NUCYNTA) 75 MG tablet Take 75 mg by mouth See admin instructions. 75 mg every morning, takes  another 75 mg later in the day if needed for pain   Yes [provider]  doxepin (SINEQUAN) 150 MG capsule Take 1 capsule (150 mg total) by mouth at bedtime. Patient not taking: Reported on 06/01/2022 05/20/22   Oletta Darter, MD    Inpatient Medications: Scheduled Meds:  apixaban  10 mg Oral BID   Followed by   Melene Muller ON 06/09/2022] apixaban  5 mg Oral BID   azelastine  2 spray Each Nare q AM   [START ON 06/04/2022] cosyntropin  0.25 mg Intravenous Once   diclofenac Sodium  4 g Topical QID   divalproex  1,000 mg Oral Daily   hydrOXYzine  10 mg Oral QHS   lidocaine  1 patch Transdermal Q24H   linaclotide  290 mcg Oral QAC breakfast   melatonin  3 mg Oral QHS   polyethylene glycol  17 g Oral Daily   Continuous Infusions:  PRN Meds: acetaminophen, bisacodyl, fluticasone, tapentadol  Allergies:    Allergies  Allergen Reactions   Bee Venom Anaphylaxis and Rash   Coconut (Cocos Nucifera) Anaphylaxis and Itching   Coconut Fatty Acids Anaphylaxis   Food Anaphylaxis and Itching    Walnuts - anaphylaxis, itching   Mixed Ragweed Anaphylaxis, Itching and Rash   Molds & Smuts Anaphylaxis   Mushroom Ext Cmplx(Shiitake-Reishi-Mait) Anaphylaxis   Shellfish Allergy Anaphylaxis   Strawberry Extract Anaphylaxis   Latex Rash   Norco [Hydrocodone-Acetaminophen] Rash    Social History:   Social History    Socioeconomic History   Marital status: Divorced    Spouse name: Not on file   Number of children: 2   Years of education: 14   Highest education level: Not on file  Occupational History   Occupation: Disabled  Tobacco Use   Smoking status: Never   Smokeless tobacco: Never  Vaping Use   Vaping Use: Never used  Substance and Sexual Activity   Alcohol use: Not Currently    Comment: 1-2 drinks/month   Drug use: No   Sexual activity: Not on file  Other Topics Concern   Not on file  Social History Narrative   Son lives with her. Caffeine use: 1 cup/day of soda   Social Determinants of Health   Financial Resource Strain: Not on file  Food Insecurity: Not on file  Transportation Needs: Not on file  Physical Activity: Not on file  Stress: Not on file  Social Connections: Not on file  Intimate Partner Violence: Not on file    Family History:    Family History  Problem Relation Age of Onset   Hypertension Mother    Cirrhosis Mother    Alcohol abuse Mother    Depression Mother    Physical abuse Mother    Hyperlipidemia Mother    Thyroid disease Mother    Anxiety disorder Mother    Arthritis Mother    Hypertension Father    Alcohol abuse Father    Hyperlipidemia Father    Depression Father    Drug abuse Father    Arthritis Father    COPD Father    Gout Father    Hypertension Sister    Alcohol abuse Sister    Drug abuse Sister    Depression Sister    Anxiety disorder Sister    OCD Sister    Thyroid disease Sister    Depression Sister    Thyroid disease Sister    Stroke Maternal Grandmother    Heart attack Maternal Grandmother    Diabetes  Maternal Uncle    Stroke Maternal Uncle    Colon cancer Maternal Uncle    Seizures Cousin    Breast cancer Cousin    Breast cancer Cousin    ADD / ADHD Other    Diabetes Other    Hypertension Other    Dementia Neg Hx    Esophageal cancer Neg Hx    Rectal cancer Neg Hx    Stomach cancer Neg Hx      ROS:  Please  see the history of present illness.   All other ROS reviewed and negative.     Physical Exam/Data:   Vitals:   06/03/22 0849 06/03/22 0930 06/03/22 1130 06/03/22 1412  BP: 121/63  115/62 107/81  Pulse: 86  86   Resp: 15 19 17 19   Temp: 98 F (36.7 C)  98.1 F (36.7 C)   TempSrc: Oral  Oral   SpO2: 97%  99%   Weight:      Height:        Intake/Output Summary (Last 24 hours) at 06/03/2022 1521 Last data filed at 06/03/2022 1100 Gross per 24 hour  Intake 355 ml  Output 0 ml  Net 355 ml      06/02/2022   12:31 PM 06/01/2022   12:48 PM 05/25/2022    9:01 AM  Last 3 Weights  Weight (lbs) 201 lb 11.5 oz 208 lb 15.9 oz 208 lb 15.9 oz  Weight (kg) 91.5 kg 94.8 kg 94.8 kg     Body mass index is 32.56 kg/m.  General:  Well nourished, well developed, in no acute distress HEENT: normal Neck: no JVD Vascular: No carotid bruits; Distal pulses 2+ bilaterally Cardiac:  normal S1, S2; RRR; no murmur  Lungs:  clear to auscultation bilaterally, no wheezing, rhonchi or rales  Abd: soft, nontender, no hepatomegaly  Ext: no edema Musculoskeletal:  No deformities, BUE and BLE strength normal and equal Skin: warm and dry  Neuro:  CNs 2-12 intact, no focal abnormalities noted Psych:  Normal affect   EKG:  The EKG was personally reviewed and demonstrates: Sinus rhythm, 93 bpm, T wave inversion in lead I, V1 V2  Telemetry:  Telemetry was personally reviewed and demonstrates:  Sinus Rhythm  Relevant CV Studies:  Echo: 06/02/2022  IMPRESSIONS     1. Left ventricular ejection fraction, by estimation, is 55 to 60%. The  left ventricle has normal function. Left ventricular endocardial border  not optimally defined to evaluate regional wall motion. Left ventricular  diastolic parameters were normal.   2. Right ventricular systolic function is normal. The right ventricular  size is normal. There is normal pulmonary artery systolic pressure. The  estimated right ventricular systolic pressure  is 21.7 mmHg.   3. The mitral valve is grossly normal. Trivial mitral valve  regurgitation. No evidence of mitral stenosis.   4. The aortic valve is normal in structure. Aortic valve regurgitation is  trivial. No aortic stenosis is present.   5. The inferior vena cava is normal in size with greater than 50%  respiratory variability, suggesting right atrial pressure of 3 mmHg.   FINDINGS   Left Ventricle: Left ventricular ejection fraction, by estimation, is 55  to 60%. The left ventricle has normal function. Left ventricular  endocardial border not optimally defined to evaluate regional wall motion.  The left ventricular internal cavity  size was normal in size. There is no left ventricular hypertrophy. Left  ventricular diastolic parameters were normal.   Right Ventricle: The right  ventricular size is normal. No increase in  right ventricular wall thickness. Right ventricular systolic function is  normal. There is normal pulmonary artery systolic pressure. The tricuspid  regurgitant velocity is 2.16 m/s, and   with an assumed right atrial pressure of 3 mmHg, the estimated right  ventricular systolic pressure is 21.7 mmHg.   Left Atrium: Left atrial size was normal in size.   Right Atrium: Right atrial size was normal in size.   Pericardium: Trivial pericardial effusion is present.   Mitral Valve: The mitral valve is grossly normal. Mild mitral annular  calcification. Trivial mitral valve regurgitation. No evidence of mitral  valve stenosis.   Tricuspid Valve: The tricuspid valve is normal in structure. Tricuspid  valve regurgitation is mild . No evidence of tricuspid stenosis.   Aortic Valve: The aortic valve is normal in structure. Aortic valve  regurgitation is trivial. No aortic stenosis is present. Aortic valve mean  gradient measures 6.0 mmHg. Aortic valve peak gradient measures 10.6 mmHg.  Aortic valve area, by VTI measures  2.29 cm.   Pulmonic Valve: The pulmonic  valve was normal in structure. Pulmonic valve  regurgitation is trivial. No evidence of pulmonic stenosis.   Aorta: The aortic root is normal in size and structure.   Venous: The inferior vena cava is normal in size with greater than 50%  respiratory variability, suggesting right atrial pressure of 3 mmHg.   IAS/Shunts: No atrial level shunt detected by color flow Doppler.    Laboratory Data:  High Sensitivity Troponin:   Recent Labs  Lab 05/25/22 1044 05/25/22 1155 06/01/22 1300 06/01/22 1519  TROPONINIHS 4 4 5 3      Chemistry Recent Labs  Lab 06/01/22 1300 06/02/22 0522  NA 137 137  K 3.9 4.1  CL 103 105  CO2 25 24  GLUCOSE 123* 79  BUN 20 20  CREATININE 1.09* 0.91  CALCIUM 8.7* 7.8*  MG 2.2  --   GFRNONAA 59* >60  ANIONGAP 9 8    Recent Labs  Lab 06/01/22 1300  PROT 6.7  ALBUMIN 3.1*  AST 19  ALT 13  ALKPHOS 57  BILITOT 0.3   Lipids No results for input(s): "CHOL", "TRIG", "HDL", "LABVLDL", "LDLCALC", "CHOLHDL" in the last 168 hours.  Hematology Recent Labs  Lab 06/01/22 1837 06/02/22 0522 06/03/22 0759  WBC 6.9 6.2 5.3  RBC 3.75* 3.58* 3.79*  HGB 11.0* 10.5* 10.9*  HCT 34.1* 33.1* 34.5*  MCV 90.9 92.5 91.0  MCH 29.3 29.3 28.8  MCHC 32.3 31.7 31.6  RDW 14.7 14.7 14.6  PLT 207 191 208   Thyroid  Recent Labs  Lab 06/01/22 1300  TSH 4.687*    BNP Recent Labs  Lab 06/01/22 1356  BNP 12.1    DDimer No results for input(s): "DDIMER" in the last 168 hours.   Radiology/Studies:  VAS 07/31/22 LOWER EXTREMITY VENOUS (DVT) (ONLY [facility] & [facility])  Result Date: 06/02/2022  Lower Venous DVT Study Patient Name:  JAMA MCMILLER  Date of Exam:   06/01/2022 Medical Rec #: [id number]        Accession #:    [id number] Date of Birth: July 02, 1965         Patient Gender: F Patient Age:   60 years Exam Location[facility]  [facility] Procedure:      VAS [facility] LOWER EXTREMITY VENOUS (DVT) Referring Phys: [name]  --------------------------------------------------------------------------------  Indications: Pain, and Swelling.  Comparison Study: Previous exam on 05/05/18 was negative for DVT Performing Technologist:  Jody Hill RVT, RDMS  Examination Guidelines: A complete evaluation includes B-mode imaging, spectral Doppler, color Doppler, and power Doppler as needed of all accessible portions of each vessel. Bilateral testing is considered an integral part of a complete examination. Limited examinations for reoccurring indications may be performed as noted. The reflux portion of the exam is performed with the patient in reverse Trendelenburg.  +-----+---------------+---------+-----------+----------+--------------+ RIGHTCompressibilityPhasicitySpontaneityPropertiesThrombus Aging +-----+---------------+---------+-----------+----------+--------------+ CFV  Full           Yes      Yes                                 +-----+---------------+---------+-----------+----------+--------------+   +---------+---------------+---------+-----------+----------+--------------+ LEFT     CompressibilityPhasicitySpontaneityPropertiesThrombus Aging +---------+---------------+---------+-----------+----------+--------------+ CFV      Full           Yes      Yes                                 +---------+---------------+---------+-----------+----------+--------------+ SFJ      Full                                                        +---------+---------------+---------+-----------+----------+--------------+ FV Prox  Full           Yes      Yes                                 +---------+---------------+---------+-----------+----------+--------------+ FV Mid   Full           Yes      Yes                                 +---------+---------------+---------+-----------+----------+--------------+ FV DistalFull           Yes      Yes                                  +---------+---------------+---------+-----------+----------+--------------+ PFV      Full                                                        +---------+---------------+---------+-----------+----------+--------------+ POP      Full           Yes      Yes                                 +---------+---------------+---------+-----------+----------+--------------+ PTV      Full                                                        +---------+---------------+---------+-----------+----------+--------------+ PERO  Full                                                        +---------+---------------+---------+-----------+----------+--------------+     Summary: RIGHT: - No evidence of common femoral vein obstruction.  LEFT: - There is no evidence of deep vein thrombosis in the lower extremity.  - No cystic structure found in the popliteal fossa.  *See table(s) above for measurements and observations. Electronically signed by Lemar Livings MD on 06/02/2022 at 5:25:49 PM.    Final    ECHOCARDIOGRAM COMPLETE  Result Date: 06/02/2022    ECHOCARDIOGRAM REPORT   Patient Name:   [name] Date of Exam: 06/02/2022 Medical Rec #:  [id number]       Height:       66.0 in Accession #:    [id number]      Weight:       209.0 lb Date of Birth:  09-Feb-1966        BSA:          2.038 m Patient Age:    56 years        BP:           106/67 mmHg Patient Gender: F               HR:           82 bpm. Exam Location:  Inpatient Procedure: 2D Echo, Cardiac Doppler and Color Doppler Indications:    Pulmonary Embolus I26.09  History:        Patient has no prior history of Echocardiogram examinations.                 Signs/Symptoms:Syncope and Dyspnea; Risk Factors:Hypertension,                 Sleep Apnea and Diabetes. Pulmonary Embolism.  Sonographer:    Lucendia Herrlich Referring Phys: [id number] DAVID HSIENTA YAO IMPRESSIONS  1. Left ventricular ejection fraction, by estimation, is 55 to 60%. The left ventricle  has normal function. Left ventricular endocardial border not optimally defined to evaluate regional wall motion. Left ventricular diastolic parameters were normal.  2. Right ventricular systolic function is normal. The right ventricular size is normal. There is normal pulmonary artery systolic pressure. The estimated right ventricular systolic pressure is 21.7 mmHg.  3. The mitral valve is grossly normal. Trivial mitral valve regurgitation. No evidence of mitral stenosis.  4. The aortic valve is normal in structure. Aortic valve regurgitation is trivial. No aortic stenosis is present.  5. The inferior vena cava is normal in size with greater than 50% respiratory variability, suggesting right atrial pressure of 3 mmHg. FINDINGS  Left Ventricle: Left ventricular ejection fraction, by estimation, is 55 to 60%. The left ventricle has normal function. Left ventricular endocardial border not optimally defined to evaluate regional wall motion. The left ventricular internal cavity size was normal in size. There is no left ventricular hypertrophy. Left ventricular diastolic parameters were normal. Right Ventricle: The right ventricular size is normal. No increase in right ventricular wall thickness. Right ventricular systolic function is normal. There is normal pulmonary artery systolic pressure. The tricuspid regurgitant velocity is 2.16 m/s, and  with an assumed right atrial pressure of 3 mmHg, the estimated right ventricular systolic pressure is 21.7 mmHg. Left Atrium: Left atrial size  was normal in size. Right Atrium: Right atrial size was normal in size. Pericardium: Trivial pericardial effusion is present. Mitral Valve: The mitral valve is grossly normal. Mild mitral annular calcification. Trivial mitral valve regurgitation. No evidence of mitral valve stenosis. Tricuspid Valve: The tricuspid valve is normal in structure. Tricuspid valve regurgitation is mild . No evidence of tricuspid stenosis. Aortic Valve: The  aortic valve is normal in structure. Aortic valve regurgitation is trivial. No aortic stenosis is present. Aortic valve mean gradient measures 6.0 mmHg. Aortic valve peak gradient measures 10.6 mmHg. Aortic valve area, by VTI measures 2.29 cm. Pulmonic Valve: The pulmonic valve was normal in structure. Pulmonic valve regurgitation is trivial. No evidence of pulmonic stenosis. Aorta: The aortic root is normal in size and structure. Venous: The inferior vena cava is normal in size with greater than 50% respiratory variability, suggesting right atrial pressure of 3 mmHg. IAS/Shunts: No atrial level shunt detected by color flow Doppler.  LEFT VENTRICLE PLAX 2D LVIDd:         4.30 cm   Diastology LVIDs:         3.10 cm   LV e' medial:    8.16 cm/s LV PW:         1.20 cm   LV E/e' medial:  9.1 LV IVS:        0.90 cm   LV e' lateral:   11.00 cm/s LVOT diam:     2.00 cm   LV E/e' lateral: 6.7 LV SV:         70 LV SV Index:   34 LVOT Area:     3.14 cm  RIGHT VENTRICLE             IVC RV Basal diam:  3.70 cm     IVC diam: 1.70 cm RV Mid diam:    2.10 cm RV S prime:     12.50 cm/s TAPSE (M-mode): 2.4 cm LEFT ATRIUM             Index        RIGHT ATRIUM           Index LA diam:        4.40 cm 2.16 cm/m   RA Area:     12.90 cm LA Vol (A2C):   71.3 ml 34.99 ml/m  RA Volume:   31.50 ml  15.46 ml/m LA Vol (A4C):   37.6 ml 18.45 ml/m LA Biplane Vol: 49.5 ml 24.29 ml/m  AORTIC VALVE AV Area (Vmax):    2.38 cm AV Area (Vmean):   2.23 cm AV Area (VTI):     2.29 cm AV Vmax:           163.00 cm/s AV Vmean:          112.000 cm/s AV VTI:            0.304 m AV Peak Grad:      10.6 mmHg AV Mean Grad:      6.0 mmHg LVOT Vmax:         123.33 cm/s LVOT Vmean:        79.433 cm/s LVOT VTI:          0.221 m LVOT/AV VTI ratio: 0.73  AORTA Ao Root diam: 3.40 cm Ao Asc diam:  3.10 cm MITRAL VALVE               TRICUSPID VALVE MV Area (PHT): 3.65 cm    TR Peak grad:   18.7  mmHg MV Decel Time: 208 msec    TR Vmax:        216.00 cm/s MV E  velocity: 74.10 cm/s MV A velocity: 65.60 cm/s  SHUNTS MV E/A ratio:  1.13        Systemic VTI:  0.22 m                            Systemic Diam: 2.00 cm Weston BrassGayatri Acharya MD Electronically signed by Weston BrassGayatri Acharya MD Signature Date/Time: 06/02/2022/11:55:10 AM    Final    CT Angio Chest PE W and/or Wo Contrast  Result Date: 06/01/2022 CLINICAL DATA:  Left leg pain and swelling, chest pain, shortness of breath, syncope EXAM: CT ANGIOGRAPHY CHEST WITH CONTRAST TECHNIQUE: Multidetector CT imaging of the chest was performed using the standard protocol during bolus administration of intravenous contrast. Multiplanar CT image reconstructions and MIPs were obtained to evaluate the vascular anatomy. RADIATION DOSE REDUCTION: This exam was performed according to the departmental dose-optimization program which includes automated exposure control, adjustment of the mA and/or kV according to patient size and/or use of iterative reconstruction technique. CONTRAST:  75mL OMNIPAQUE IOHEXOL 350 MG/ML SOLN COMPARISON:  Chest radiographs done earlier today, previous CT done on 04/11/2003 FINDINGS: Cardiovascular: There are intraluminal filling defects in few segmental and subsegmental branches in right upper lobe and right lower lobe. There is small thrombus burden. There is no demonstrable saddle embolus. RV LV ratio is 1.13. there is homogeneous enhancement in thoracic aorta. Mediastinum/Nodes: No significant lymphadenopathy is seen. Small pericardial effusion is present. Lungs/Pleura: Breathing motion limits evaluation. Small patchy ground-glass densities seen in both lungs. There is no focal consolidation. Left hemidiaphragm is elevated. There is no pleural effusion or pneumothorax. Upper Abdomen: Surgical staples are seen in stomach suggesting possible previous bariatric surgery. There is reflux of contrast into hepatic veins suggesting right heart dysfunction. Musculoskeletal: No acute findings are seen. Review of the MIP  images confirms the above findings. IMPRESSION: Acute pulmonary embolism with small thrombus burden. RV LV ratio is 1.13. It is unlikely that this small thrombus burden PE is causing it. There is probably underlying chronic right heart dysfunction. Small pericardial effusion is seen. There is no evidence of thoracic aortic dissection. There is no focal consolidation. There is no pleural effusion or pneumothorax. There are small faint foci of ground-glass densities in both lungs, possibly suggesting scarring or early interstitial pneumonia. Imaging finding of small thrombus burden PE was relayed to patient's provider Dr. Andreas OhmYal by telephone call. Electronically Signed   By: Ernie AvenaPalani  Rathinasamy M.D.   On: 06/01/2022 17:35   CT HEAD WO CONTRAST (5MM)  Result Date: 06/01/2022 CLINICAL DATA:  Syncope, trauma EXAM: CT HEAD WITHOUT CONTRAST TECHNIQUE: Contiguous axial images were obtained from the base of the skull through the vertex without intravenous contrast. RADIATION DOSE REDUCTION: This exam was performed according to the departmental dose-optimization program which includes automated exposure control, adjustment of the mA and/or kV according to patient size and/or use of iterative reconstruction technique. COMPARISON:  05/25/2022 FINDINGS: Brain: No acute intracranial findings are seen in noncontrast CT brain. There are no signs of bleeding within the cranium. Ventricles are not dilated. Cortical sulci are prominent. There is no focal edema or mass effect. Vascular: Unremarkable. Skull: No fracture is seen in calvarium. Sinuses/Orbits: Unremarkable. Other: None. IMPRESSION: No acute intracranial findings are seen in noncontrast CT brain. No significant interval changes are noted. Electronically Signed   By: Rhae HammockPalani  Rathinasamy M.D.   On: 06/01/2022 17:24   DG Ankle Complete Left  Result Date: 06/01/2022 CLINICAL DATA:  Left ankle pain after fall several days ago. EXAM: LEFT ANKLE COMPLETE - 3+ VIEW COMPARISON:   None Available. FINDINGS: There is no evidence of fracture, dislocation, or joint effusion. There is no evidence of arthropathy or other focal bone abnormality. Soft tissues are unremarkable. IMPRESSION: Negative. Electronically Signed   By: Marijo Conception M.D.   On: 06/01/2022 15:04   DG Chest 1 View  Result Date: 06/01/2022 CLINICAL DATA:  Provided history: Cough.  Shortness of breath. EXAM: CHEST  1 VIEW COMPARISON:  Prior chest radiographs 05/25/2022 and earlier. FINDINGS: The cardiomediastinal silhouette is unchanged. Left perihilar atelectasis. Chronic elevation of the left hemidiaphragm, limiting evaluation of the left lung base. Additionally, superimposition of the right hemidiaphragm limits evaluation of the right lung base. Within described limitations, there is no appreciable airspace consolidation. No evidence of pleural effusion or pneumothorax. Degenerative changes of the spine. IMPRESSION: Chronic elevation of the left hemidiaphragm, limiting evaluation of the left lung base. Additionally, superimposition of the right hemidiaphragm limits evaluation of the right lung base. Within described limitations, there is no appreciable airspace consolidation. However, consider a lateral view radiograph for further evaluation. Left perihilar atelectasis. Electronically Signed   By: Kellie Simmering D.O.   On: 06/01/2022 13:39     Assessment and Plan:   SHARLISA HOLLIFIELD is a 57 y.o. 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 Erin Hearing.  Syncope -- Long history of the same, echocardiogram shows LVEF of 55 to 60%, no significant valvular disease.  Recent orthostatic vital signs from this afternoon negative.  EKG without concerning findings.  She is on several medications which could be affecting her blood pressure including  doxepin and Nucynta, also reports taking phentermine. Follows in pain clinic.  -- Ideally would have patient undergo exercise tolerance test to follow heart rate and blood pressure , rule out concerning arrhythmias but she is currently having significant left ankle pain. -- Plan for outpatient 30-day monitor -- Will arrange follow-up in the office, at that time can consider treadmill test  PE -- noted on CT angio this admission, Currently on PE dose Eliquis -- Given small clot burden, low likelihood that this was contributing to her syncope  Per Primary Graves' disease Bipolar disorder Diabetes   For questions or updates, please contact [place] Please consult [email or web address] for contact info under    Signed, Reino Bellis, NP  06/03/2022 3:21 PM

## 2022-06-03 NOTE — Progress Notes (Signed)
   06/03/22 1441  Mobility  Activity Ambulated with assistance in room;Stood at bedside  Level of Assistance Standby assist, set-up cues, supervision of patient - no hands on  Assistive Device Front wheel walker  Distance Ambulated (ft) 2 ft  Activity Response Tolerated fair  Mobility Referral Yes  $Mobility charge 1 Mobility   Mobility Specialist Progress Note  Pt was in bed and agreeable. Denied further ambulation d/t L foot pain. Returned to bed w/ all needs met and call bell in reach. RN notified.  Lucious Groves Mobility Specialist  Please contact via SecureChat or Rehab office at [phone]

## 2022-06-03 NOTE — TOC Transition Note (Addendum)
Transition of Care [facility]) - CM/SW Discharge Note   Patient Details  Name: [name] MRN: [id number] Date of Birth: 1965-06-17  Transition of Care [facility]) CM/SW Contact:  Zenon Mayo, RN Phone Number: 06/03/2022, 1:27 PM   Clinical Narrative:    NCM spoke with patient at the bedside, offered choice for HHPT with Medicare. Gov, she is ok with [facility].  NCM made referral to [facility] with [facility] for [place].  Awaiting to hear back from him.  Patient states her son will transport her home at [employer] Per [place] with [facility] he can take referral.  Soc will begin 24 to 48 hrs post dc. .    Final next level of care: Home w Home Health Services Barriers to Discharge: Continued Medical Work up   Patient Goals and CMS Choice CMS Medicare.gov Compare Post Acute Care list provided to:: Patient Choice offered to / list presented to : Patient  Discharge Placement                         Discharge Plan and Services Additional resources added to the After Visit Summary for                  DME Arranged: N/A         HH Arranged: PT HH Agency: [place] Date [place]: 06/03/22 Time [place]: [id number] Representative spoke with at [place]: [place] ([place]) Interventions [place]: Low Risk  (05/26/2022)  Depression (PHQ2-9): High Risk (05/20/2022)  Tobacco Use: Low Risk  (06/01/2022)     Readmission Risk Interventions     No data to display

## 2022-06-04 ENCOUNTER — Other Ambulatory Visit (HOSPITAL_COMMUNITY): Payer: Self-pay

## 2022-06-04 DIAGNOSIS — I2699 Other pulmonary embolism without acute cor pulmonale: Secondary | ICD-10-CM | POA: Diagnosis not present

## 2022-06-04 LAB — ACTH STIMULATION, 3 TIME POINTS
Cortisol, 30 Min: 15.8 ug/dL
Cortisol, 60 Min: 19.6 ug/dL
Cortisol, Base: 2.9 ug/dL

## 2022-06-04 MED ORDER — ACETAMINOPHEN 500 MG PO TABS: 1000.0000 mg | ORAL_TABLET | Freq: Four times a day (QID) | ORAL | 0 refills | Status: DC | PRN

## 2022-06-04 MED ORDER — DICLOFENAC SODIUM 1 % EX GEL: 4.0000 g | Freq: Four times a day (QID) | CUTANEOUS | Status: AC

## 2022-06-04 MED ORDER — DIPHENHYDRAMINE HCL 25 MG PO CAPS
25.0000 mg | ORAL_CAPSULE | Freq: Once | ORAL | Status: AC | PRN
  Administered 2022-06-04: 25 mg via ORAL
  Filled 2022-06-04: qty 1

## 2022-06-04 MED ORDER — FLUTICASONE PROPIONATE 50 MCG/ACT NA SUSP
1.0000 | NASAL | 0 refills | Status: AC | PRN
  Filled 2022-06-04: qty 16, 30d supply, fill #0

## 2022-06-04 MED ORDER — APIXABAN 5 MG PO TABS
ORAL_TABLET | ORAL | 0 refills | Status: DC
  Filled 2022-06-04: qty 58, 25d supply, fill #0

## 2022-06-04 NOTE — TOC Transition Note (Signed)
Transition of Care [facility]) - CM/SW Discharge Note   Patient Details  Name: [name] MRN: [id number] Date of Birth: [birth year]  Transition of Care [facility]) CM/SW Contact:  Zenon Mayo, RN Phone Number: 06/04/2022, 12:09 PM   Clinical Narrative:    Patient is for dc today, PT rec a walker for patient, patient states she does not want a walker she prefers a cane,  she has no preference of the DME agency, she also wants a shower stool.  NCM made referral to [employer] for DME thru [facility], this will be delivered to patient's room prior to dc.  Patient states her ride will be here at 5 pm tor transport her home today.    Final next level of care: Home w Home Health Services Barriers to Discharge: No Barriers Identified   Patient Goals and CMS Choice CMS Medicare.gov Compare Post Acute Care list provided to:: Patient Choice offered to / list presented to : Patient  Discharge Placement                         Discharge Plan and Services Additional resources added to the After Visit Summary for                  DME Arranged: Kasandra Knudsen, Shower stool DME Agency: [employer] Date DME Agency Contacted: 06/04/22 Time DME Agency Contacted: 1208 Representative spoke with at DME Agency: [facility] HH Arranged: PT, OT HH Agency: [place] Date [place]: 06/03/22 Time [place]: 1326 Representative spoke with at [place]: [place] ([place]) Interventions SDOH Screenings   Food Insecurity: No Food Insecurity (06/04/2022)  Housing: Low Risk  (06/04/2022)  Transportation Needs: No Transportation Needs (06/04/2022)  Utilities: Not At Risk (06/04/2022)  Depression (PHQ2-9): High Risk (05/20/2022)  Tobacco Use: Low Risk  (06/01/2022)     Readmission Risk Interventions     No data to display

## 2022-06-04 NOTE — Discharge Summary (Cosign Needed Addendum)
[facility] Discharge Summary  Patient name: [name] Medical record number: [id number] Date of birth: [birth year] Age: 57 y.o. Gender: female Date of Admission: 06/01/2022  Date of Discharge: 06/04/2022 Admitting Physician: Kinnie Feil, MD  Primary Care Provider: Berkley Harvey, NP Consultants: Cardiology  Indication for Hospitalization: Syncope, pulmonary embolism  Discharge Diagnoses/Problem List:  Principal Problem for Admission: Pulmonary Embolism Other Problems addressed during stay:  Principal Problem:   Pulmonary embolism ([place]) Active Problems:   Graves' disease   Bipolar disorder ([place])   Syncope and collapse   Type 2 diabetes mellitus without complications ([place])   Acute pain of left foot   Acute left ankle pain  Brief Hospital Course:  [name] is a 57 y.o.female with a history of vasovagal syncope, fall on 05/29/22, T2DM, HTN, bipolar disorder, graves disease, RA, and s/p gastric bypass in 2008 who was admitted to the [place] [facility] for syncope and incidentally found to have pulmonary embolism. Her hospital course is detailed below:  Syncope Patient presents after syncopal episode on 05/29/22. Last hospitalized for syncope 1/2-05/27/22 with negative workup.  BP soft, ranging from 99/55 to 121/63, no significant change s/p fluid bolus.  Hx of Vaso-vagal syncope noted after tilt table testing in 2014, ZIO patch in 2018 was normal.  Echo results this admission unremarkable (55-60% EF).  AM cortisol was indeterminate and ACTH stimulation test did not show evidence of adrenal suppression.  Cardiology consulted and recommend outpatient follow up with exercise tolerance test and plan to conduct 30 day outpatient cardiac monitoring.  Discontinued Doxepin and Nucynta on discharge to reduce falls.  Counseled patient on possibility of insomnia and other discomfort as she stops these meds, emphasized importance of fall prevention.  Recommended abdominal binder and hydration.  Pulmonary embolism [facility]) Patient presents with PE found incidentally during syncope workup.  Patient remained stable on room air throughout hospitalization.  Negative DVT US on admission, echo unremarkable (EF 55-60%).  Patient noted to be taking Estrace at home and now discontinued, which is a possible provoking event for this PE. Discharged on Eliquis; loading dose of 10 mg BID for 1 week (on day 3 at discharge), then 5 mg BID for 21 days for total of 28 days anticoagulation.  Patient does have past Hx of DVT in chart, but also is very high falls and bleed risk.  Provided education concerning bleed risk on Eliquis in the event of syncope and fall, instructed patient to present to ED after fall or head injury.  Discontinued Advil to reduce bleed risk, educated patient.  Acute left ankle pain L ankle injury following syncopal episode. On arrival, patient unable to bear weight with significant foot and ankle swelling as well as left medial malleolus TTP.  Foot and ankle XR obtained, did not show any acute osseous pathology, likely just soft tissue injury.  PT and OT evaluated patient, recommended shower chair, rolling walker as well as home health.  Patient discharged with Voltaren gel and lidocaine patch PRN as well as tylenol OTC.  Discontinued NSAID due to bleed risk, recommended Tylenol instead.  Other chronic conditions were medically managed with home medications and formulary alternatives as necessary (Graves disease, bipolar disorder, T2DM).  Issues for follow up: Doxepin and Nucynta discontinued d/t syncopal episodes. Ensure pt understanding. Estrace d/c'd d/t PE, ensure pt understating.  Ensure follow up with cariology Ensure follow up with [facility] PT/OT Follow up ankle pain  Disposition: Home with home  health PT/OT  Discharge Condition: Stable, on Eliquis, no O2 requirement  PCP Follow-up Recommendations: Discuss risks/benefits of  anticoagulation outpatient for shared decision making, on 30 days total Eliquis right now for provoked PE, high falls risk Stopped Nucynta and Doxepin due to falls risk,  discuss adherence Left ankle/foot evaluation and pain management, consider imaging if not improving Home health PT/OT follow up, needs to get assistive devices (walker, shower chair) Cardiology follow up on 06/18/22, considering exercise tolerance test when ankle improved  Discharge Exam:  Vitals:   06/04/22 0343 06/04/22 1227  BP: (!) 99/55 117/76  Pulse: 86 82  Resp: 18 18  Temp: 98 F (36.7 C) 98.6 F (37 C)  SpO2: 98% 97%   General: Resting comfortably in bed, NAD, alert and at baseline. HEENT: Moist mucous membranes. No conjunctival erythema. Cardiovascular: Regular rate and rhythm. Normal S1/S2. No murmurs, rubs, or gallops appreciated. 2+ radial pulses. Pulmonary: Clear bilaterally to ascultation. No increased WOB, no accessory muscle usage. No wheezes, rales, or crackles. Abdominal: Normoactive bowel sounds. No tenderness to deep or light palpation. No rebound or guarding. No HSM. Skin: Warm and dry, no rashes noted, though mild excoriations on forearms from pruritus. Extremities: No edema of RLE, mild edema of L ankle and foot to mid metatarsals   Significant Procedures: - Echocardiogram: EF 55-60%, unremarkable  Significant Labs and Imaging:  Recent Labs  Lab 06/03/22 0759  WBC 5.3  HGB 10.9*  HCT 34.5*  PLT 208   No results for input(s): "NA", "K", "CL", "CO2", "GLUCOSE", "BUN", "CREATININE", "CALCIUM", "MG", "PHOS", "ALKPHOS", "AST", "ALT", "ALBUMIN", "PROTEIN" in the last 48 hours.  Pertinent Imaging: - CTA chest: Acute pulmonary embolism with small thrombus burden  - CT Head w/o contrast: No acute intracranial findings. - CXR: Largely unremarkable, left perihilar atelectasis. - XR left ankle: Normal.  Results/Tests Pending at Time of Discharge: None  Discharge Medications:  Allergies as  of 06/04/2022       Reactions   Bee Venom Anaphylaxis, Rash   Coconut (cocos Nucifera) Anaphylaxis, Itching   Coconut Fatty Acids Anaphylaxis   Food Anaphylaxis, Itching   Walnuts - anaphylaxis, itching   Mixed Ragweed Anaphylaxis, Itching, Rash   Molds & Smuts Anaphylaxis   Mushroom Ext Cmplx(shiitake-reishi-mait) Anaphylaxis   Shellfish Allergy Anaphylaxis   Strawberry Extract Anaphylaxis   Latex Rash   Norco [hydrocodone-acetaminophen] Rash        Medication List     STOP taking these medications    benzonatate 100 MG capsule Commonly known as: TESSALON   doxepin 100 MG capsule Commonly known as: SINEQUAN   doxepin 150 MG capsule Commonly known as: SINEQUAN   estradiol 0.5 MG tablet Commonly known as: ESTRACE   ibuprofen 800 MG tablet Commonly known as: ADVIL   linaclotide 290 MCG Caps capsule Commonly known as: Linzess   phentermine 37.5 MG tablet Commonly known as: ADIPEX-P   tapentadol HCl 75 MG tablet Commonly known as: NUCYNTA       TAKE these medications    acetaminophen 500 MG tablet Commonly known as: TYLENOL Take 2 tablets (1,000 mg total) by mouth every 6 (six) hours as needed for mild pain, moderate pain, headache or fever.   albuterol 108 (90 Base) MCG/ACT inhaler Commonly known as: VENTOLIN HFA Inhale 2 puffs into the lungs every 6 (six) hours as needed for wheezing.   apixaban 5 MG Tabs tablet Commonly known as: ELIQUIS Take 2 tablets (10 mg total) by mouth 2 (two) times daily for  4 days, THEN 1 tablet (5 mg total) 2 (two) times daily for 21 days. Start taking on: June 04, 2022   azelastine 0.1 % nasal spray Commonly known as: ASTELIN Place 2 sprays into both nostrils daily as needed for allergies. Use in each nostril as directed   bisacodyl 10 MG suppository Commonly known as: Dulcolax Place 1 suppository (10 mg total) rectally as needed for moderate constipation.   diclofenac Sodium 1 % Gel Commonly known as:  VOLTAREN Apply 4 g topically 4 (four) times daily.   divalproex 500 MG 24 hr tablet Commonly known as: DEPAKOTE ER Take 2 tablets (1,000 mg total) by mouth daily. What changed:  how much to take when to take this   Emgality 120 MG/ML Soaj Generic drug: Galcanezumab-gnlm INJECT 120 MG INTO THE SKIN EVERY 30 DAYS What changed:  how much to take how to take this when to take this additional instructions   EpiPen 2-Pak 0.3 mg/0.3 mL Soaj injection Generic drug: EPINEPHrine Inject 0.3 mg into the muscle as needed for anaphylaxis.   fluticasone 50 MCG/ACT nasal spray Commonly known as: FLONASE Place 1 spray into both nostrils as needed (allergies/runny nose).   lidocaine 5 % Commonly known as: LIDODERM Place 1-2 patches onto the skin daily as needed (pain). Remove & Discard patch within 12 hours or as directed by MD   MELATONIN PO Take 1 tablet by mouth at bedtime as needed (sleep).   Mounjaro 10 MG/0.5ML Pen Generic drug: tirzepatide Inject 10 mg into the skin every Wednesday.   multivitamin tablet Take 1 tablet by mouth in the morning.               Durable Medical Equipment  (From admission, onward)           Start     Ordered   06/04/22 1204  For home use only DME Shower stool  Once        06/04/22 1203   06/04/22 1203  For home use only DME Cane  Once        06/04/22 1203   06/04/22 0939  For home use only DME Walker rolling  Once       Comments: 2 wheel walker needed.  Question Answer Comment  Walker: With 5 Inch Wheels   Patient needs a walker to treat with the following condition Left ankle injury   Patient needs a walker to treat with the following condition Impaired functional mobility, balance, gait, and endurance      06/04/22 0940            Discharge Instructions: Please refer to Patient Instructions section of EMR for full details.  Patient was counseled important signs and symptoms that should prompt return to medical care, changes  in medications, dietary instructions, activity restrictions, and follow up appointments.  Follow-Up Appointments:  Follow-up Information     [facility] Follow up.   Specialty: Home Health Services Why: Agency will call you to set up apt times Contact information: [address] [place] [address] [phone]         Iona Hansen, NP Follow up.   Specialty: Nurse Practitioner Why: Please follow up in a week. Contact information: [address] [place] [address] [phone]         [facility] Follow up.   Why: shower stook, single point cane Contact information: [address] [place] [address] [phone]  Precious Gilding, DO 06/04/2022, 3:13 PM MS4, [facility], [place]

## 2022-06-04 NOTE — Progress Notes (Signed)
Physical Therapy Treatment Patient Details Name: [name] MRN: [id number] DOB: 07-11-1965 Today's Date: 06/04/2022   History of Present Illness Pt is a 57 y.o. female admitted 06/01/2022 with recurrent syncope, LLE pain; pt reports she may have fallen on LLE when she passed out. Workup revealed PE, likely not cause of syncope. L ankle xray negative for acute injury. Head CT negative for acute injury. PMH includes RA, migraines, OSA, vasovagal syncope, Graves' disease, vertigo, bipolar, chronic fatigue syndrome, HLD, gastric bypass, DM, HTN.   PT Comments    Pt progressing with mobility. Today's session focused on gait training with SPC; pt demonstrates improving L ankle ROM and tolerance for increasing weight bearing. LLE/foot therex performed for improving mobility, HEP handout provided. Pt declines RW use despite pain. Pt preparing for d/c home today; all questions and concerns addressed.    Recommendations for follow up therapy are one component of a multi-disciplinary discharge planning process, led by the attending physician.  Recommendations may be updated based on patient status, additional functional criteria and insurance authorization.  Follow Up Recommendations  Home health PT     Assistance Recommended at Discharge Intermittent Supervision/Assistance  Patient can return home with the following Assistance with cooking/housework;Assist for transportation;Help with stairs or ramp for entrance   Equipment Recommendations  Cane; Shower seat (declines RW)    Recommendations for Other Services Mobility Specialist      Precautions / Restrictions Precautions Precautions: Fall Restrictions Weight Bearing Restrictions: No     Mobility  Bed Mobility Overal bed mobility: Independent                  Transfers Overall transfer level: Modified independent Equipment used: None, Straight cane Transfers: Sit to/from Stand                   Ambulation/Gait Ambulation/Gait assistance: Modified independent (Device/Increase time)     Gait Pattern/deviations: Step-through pattern, Antalgic Gait velocity: Decreased     General Gait Details: gait training with SPC, pt requires cues to increase LLE WBAT and heel to floor for ankle/calf stretch; able to improve LLE AROM with distance though reports increasing pain in arch of foot requiring seated rest; good sequencing with SPC   Stairs Stairs:  (pt declines due to pain, reports no concerns; reviewed educ for leading up stairs with RLE, go down with LLE (painful) leg)           Wheelchair Mobility    Modified Rankin (Stroke Patients Only)       Balance Overall balance assessment: Needs assistance Sitting-balance support: Feet supported Sitting balance-Leahy Scale: Good     Standing balance support: Single extremity supported, No upper extremity supported, During functional activity Standing balance-Leahy Scale: Fair Standing balance comment: can static stand without UE support, preference to offload painful L foot                            Cognition Arousal/Alertness: Awake/alert Behavior During Therapy: WFL for tasks assessed/performed Overall Cognitive Status: Within Functional Limits for tasks assessed                                          Exercises Other Exercises Other Exercises: LLE ankle pumps, seated gastroc stretch with strap, seated hamstring stretch, towel scrunches with L toe (pt having difficulty with this due to pain)  Other Exercises: [employer] HEP handout (Access Code [place]) provided    General Comments General comments (skin integrity, edema, etc.): pt declines RW use, therefore focused on gait training with SPC. educ on importance of gentle L ankle/foot AROM; HEP handout provided      Pertinent Vitals/Pain Pain Assessment Pain Assessment: Faces Faces Pain Scale: Hurts even more Pain Location: L foot  arch Pain Descriptors / Indicators: Discomfort, Grimacing, Guarding Pain Intervention(s): Monitored during session, Other (comment) (pain patch on anterior ankle)    Home Living                          Prior Function            PT Goals (current goals can now be found in the care plan section) Progress towards PT goals: Progressing toward goals    Frequency    Min 3X/week      PT Plan Current plan remains appropriate    Co-evaluation              AM-PAC PT "6 Clicks" Mobility   Outcome Measure  Help needed turning from your back to your side while in a flat bed without using bedrails?: None Help needed moving from lying on your back to sitting on the side of a flat bed without using bedrails?: None Help needed moving to and from a bed to a chair (including a wheelchair)?: A Little Help needed standing up from a chair using your arms (e.g., wheelchair or bedside chair)?: A Little Help needed to walk in hospital room?: A Little Help needed climbing 3-5 steps with a railing? : A Little 6 Click Score: 20    End of Session   Activity Tolerance: Patient tolerated treatment well;Patient limited by pain Patient left: in bed;with call bell/phone within reach Nurse Communication: Mobility status PT Visit Diagnosis: Other abnormalities of gait and mobility (R26.89);Muscle weakness (generalized) (M62.81);History of falling (Z91.81)     Time: 5916-3846 PT Time Calculation (min) (ACUTE ONLY): 18 min  Charges:  $Gait Training: 8-22 mins                     Mabeline Caras, PT, DPT Acute Rehabilitation Services  Personal: [place] Office: [place] 06/04/2022, 4:07 PM

## 2022-06-04 NOTE — Care Management Important Message (Signed)
Important Message  Patient Details  Name: LAVENDER STANKE MRN: [id number] Date of Birth: 02/05/1966   Medicare Important Message Given:  Yes     Alita, [place] 06/04/2022, 8:35 AM

## 2022-06-04 NOTE — Progress Notes (Addendum)
Subjective:  Denies SSCP, palpitations or Dyspnea   Objective:  Vitals:   06/03/22 1412 06/03/22 1537 06/03/22 1930 06/04/22 0343  BP: 107/81 118/68 (!) 104/55 (!) 99/55  Pulse:  86 86 86  Resp: 19 18 16 18   Temp:  98.1 F (36.7 C) 97.9 F (36.6 C) 98 F (36.7 C)  TempSrc:  Oral Oral Oral  SpO2:  97% 98% 98%  Weight:    89.8 kg  Height:        Intake/Output from previous day:  Intake/Output Summary (Last 24 hours) at 06/04/2022 0934 Last data filed at 06/04/2022 0845 Gross per 24 hour  Intake 358 ml  Output 400 ml  Net -42 ml    Physical Exam: Affect appropriate Healthy:  appears stated age HEENT: normal Neck supple with no adenopathy JVP normal no bruits no thyromegaly Lungs clear with no wheezing and good diaphragmatic motion Heart:  S1/S2 no murmur, no rub, gallop or click PMI normal Abdomen: benighn, BS positve, no tenderness, no AAA no bruit.  No HSM or HJR Distal pulses intact with no bruits No edema Neuro non-focal Skin warm and dry Left ankle tender    Lab Results: Basic Metabolic Panel: Recent Labs    06/01/22 1300 06/02/22 0522  NA 137 137  K 3.9 4.1  CL 103 105  CO2 25 24  GLUCOSE 123* 79  BUN 20 20  CREATININE 1.09* 0.91  CALCIUM 8.7* 7.8*  MG 2.2  --    Liver Function Tests: Recent Labs    06/01/22 1300  AST 19  ALT 13  ALKPHOS 57  BILITOT 0.3  PROT 6.7  ALBUMIN 3.1*   Recent Labs    06/01/22 1300  LIPASE 26   CBC: Recent Labs    06/01/22 1300 06/01/22 1837 06/02/22 0522 06/03/22 0759  WBC 6.8   < > 6.2 5.3  NEUTROABS 4.2  --   --   --   HGB 11.8*   < > 10.5* 10.9*  HCT 37.3   < > 33.1* 34.5*  MCV 92.1   < > 92.5 91.0  PLT 218   < > 191 208   < > = values in this interval not displayed.    Recent Labs    06/01/22 1300  TSH 4.687*    Imaging: ECHOCARDIOGRAM COMPLETE  Result Date: 06/02/2022    ECHOCARDIOGRAM REPORT   Patient Name:   [name] Date of Exam: 06/02/2022 Medical Rec #:  [id number]        Height:       66.0 in Accession #:    [id number]      Weight:       209.0 lb Date of Birth:  Jun 15, 1965        BSA:          2.038 m Patient Age:    57 years        BP:           106/67 mmHg Patient Gender: F               HR:           82 bpm. Exam Location:  Inpatient Procedure: 2D Echo, Cardiac Doppler and Color Doppler Indications:    Pulmonary Embolus I26.09  History:        Patient has no prior history of Echocardiogram examinations.                 Signs/Symptoms:Syncope and Dyspnea; Risk  Factors:Hypertension,                 Sleep Apnea and Diabetes. Pulmonary Embolism.  Sonographer:    Lucendia Herrlich Referring Phys: [phone] DAVID HSIENTA YAO IMPRESSIONS  1. Left ventricular ejection fraction, by estimation, is 55 to 60%. The left ventricle has normal function. Left ventricular endocardial border not optimally defined to evaluate regional wall motion. Left ventricular diastolic parameters were normal.  2. Right ventricular systolic function is normal. The right ventricular size is normal. There is normal pulmonary artery systolic pressure. The estimated right ventricular systolic pressure is 21.7 mmHg.  3. The mitral valve is grossly normal. Trivial mitral valve regurgitation. No evidence of mitral stenosis.  4. The aortic valve is normal in structure. Aortic valve regurgitation is trivial. No aortic stenosis is present.  5. The inferior vena cava is normal in size with greater than 50% respiratory variability, suggesting right atrial pressure of 3 mmHg. FINDINGS  Left Ventricle: Left ventricular ejection fraction, by estimation, is 55 to 60%. The left ventricle has normal function. Left ventricular endocardial border not optimally defined to evaluate regional wall motion. The left ventricular internal cavity size was normal in size. There is no left ventricular hypertrophy. Left ventricular diastolic parameters were normal. Right Ventricle: The right ventricular size is normal. No increase in right  ventricular wall thickness. Right ventricular systolic function is normal. There is normal pulmonary artery systolic pressure. The tricuspid regurgitant velocity is 2.16 m/s, and  with an assumed right atrial pressure of 3 mmHg, the estimated right ventricular systolic pressure is 21.7 mmHg. Left Atrium: Left atrial size was normal in size. Right Atrium: Right atrial size was normal in size. Pericardium: Trivial pericardial effusion is present. Mitral Valve: The mitral valve is grossly normal. Mild mitral annular calcification. Trivial mitral valve regurgitation. No evidence of mitral valve stenosis. Tricuspid Valve: The tricuspid valve is normal in structure. Tricuspid valve regurgitation is mild . No evidence of tricuspid stenosis. Aortic Valve: The aortic valve is normal in structure. Aortic valve regurgitation is trivial. No aortic stenosis is present. Aortic valve mean gradient measures 6.0 mmHg. Aortic valve peak gradient measures 10.6 mmHg. Aortic valve area, by VTI measures 2.29 cm. Pulmonic Valve: The pulmonic valve was normal in structure. Pulmonic valve regurgitation is trivial. No evidence of pulmonic stenosis. Aorta: The aortic root is normal in size and structure. Venous: The inferior vena cava is normal in size with greater than 50% respiratory variability, suggesting right atrial pressure of 3 mmHg. IAS/Shunts: No atrial level shunt detected by color flow Doppler.  LEFT VENTRICLE PLAX 2D LVIDd:         4.30 cm   Diastology LVIDs:         3.10 cm   LV e' medial:    8.16 cm/s LV PW:         1.20 cm   LV E/e' medial:  9.1 LV IVS:        0.90 cm   LV e' lateral:   11.00 cm/s LVOT diam:     2.00 cm   LV E/e' lateral: 6.7 LV SV:         70 LV SV Index:   34 LVOT Area:     3.14 cm  RIGHT VENTRICLE             IVC RV Basal diam:  3.70 cm     IVC diam: 1.70 cm RV Mid diam:    2.10 cm RV S prime:  12.50 cm/s TAPSE (M-mode): 2.4 cm LEFT ATRIUM             Index        RIGHT ATRIUM           Index LA diam:         4.40 cm 2.16 cm/m   RA Area:     12.90 cm LA Vol (A2C):   71.3 ml 34.99 ml/m  RA Volume:   31.50 ml  15.46 ml/m LA Vol (A4C):   37.6 ml 18.45 ml/m LA Biplane Vol: 49.5 ml 24.29 ml/m  AORTIC VALVE AV Area (Vmax):    2.38 cm AV Area (Vmean):   2.23 cm AV Area (VTI):     2.29 cm AV Vmax:           163.00 cm/s AV Vmean:          112.000 cm/s AV VTI:            0.304 m AV Peak Grad:      10.6 mmHg AV Mean Grad:      6.0 mmHg LVOT Vmax:         123.33 cm/s LVOT Vmean:        79.433 cm/s LVOT VTI:          0.221 m LVOT/AV VTI ratio: 0.73  AORTA Ao Root diam: 3.40 cm Ao Asc diam:  3.10 cm MITRAL VALVE               TRICUSPID VALVE MV Area (PHT): 3.65 cm    TR Peak grad:   18.7 mmHg MV Decel Time: 208 msec    TR Vmax:        216.00 cm/s MV E velocity: 74.10 cm/s MV A velocity: 65.60 cm/s  SHUNTS MV E/A ratio:  1.13        Systemic VTI:  0.22 m                            Systemic Diam: 2.00 cm Weston Brass MD Electronically signed by Weston Brass MD Signature Date/Time: 06/02/2022/11:55:10 AM    Final     Cardiac Studies:  ECG: SR normal QT no Brugada    Telemetry:  NSR   Echo: 06/02/22 EF 55-60% normal RV no significant valve dx   Medications:    apixaban  10 mg Oral BID   Followed by   Melene Muller ON 06/09/2022] apixaban  5 mg Oral BID   azelastine  2 spray Each Nare q AM   diclofenac Sodium  4 g Topical QID   divalproex  1,000 mg Oral Daily   hydrOXYzine  10 mg Oral QHS   lidocaine  1 patch Transdermal Q24H   linaclotide  290 mcg Oral QAC breakfast   melatonin  3 mg Oral QHS   polyethylene glycol  17 g Oral Daily      Assessment/Plan:   Syncope:  Doubt cardiac etiology given normal echo, ECG and telemetry No high risk family history or ECG features in regard to RV dysplasia, QT, or Brugada Discussed d/c doxepin nucynta Outpatient ETT and 30 day event monitor scheduled She cannot do ETT to look at hemodynamic response r/o exercise induced arrhythmia due to left ankle pain now She  indicates taking phentermine for weight loss would not add midodrine if postural Cortisol stim test negative  Sciatica:  she sees pain clinic in ? [place] Chronic Nucynta has seen neruosurgery but no operation  Should resolve this issue  Thyroid:  TSH 4.7 f/u primary  PE:  small no cor pulmonale on eliquis f/u primary not likely cause of syncope Consider hematology referral for hypercoagulable state so she does not have to be on life long anticoagulation with syncope   Ok to d/c home   Jenkins Rouge 06/04/2022, 9:34 AM

## 2022-06-17 NOTE — Progress Notes (Deleted)
Office Visit    Patient Name: [name] Date of Encounter: 06/17/2022  Primary Care Provider:  Berkley Harvey, NP Primary Cardiologist:  Jenkins Rouge, MD Primary Electrophysiologist: None  Chief Complaint    [name] is a 57 y.o. female with PMH of HTN, PE (on Eliquis), Graves' disease s/p radioactive iodine therapy, bipolar disorder, RA, gastric bypass 2008 who presents today for hospital follow-up of syncope.  Past Medical History    Past Medical History:  Diagnosis Date   Acute kidney injury ([place]) 10/25/2020   Acute respiratory disease due to COVID-19 virus 10/25/2020   AKI (acute kidney injury) ([place]) 10/25/2020   Allergic rhinitis    Allergic to cats    pet dander   Allergy    Anemia    Anxiety    Arthritis 09/2018   both feet   Asthma    Back pain    Bipolar disorder (HCC)    Cervicalgia    Chronic fatigue syndrome    Chronic low back pain with left-sided sciatica    Class 3 severe obesity with serious comorbidity and body mass index (BMI) of 40.0 to 44.9 in adult [facility]) 11/14/2014   Constipation    Depression    Diabetes mellitus without complication (HCC)    "pre"   Dyspnea    Environmental allergies    Fibromyalgia    GERD (gastroesophageal reflux disease)    Grave's disease    Heart murmur    High cholesterol    History of blood clots    Hypertension    Joint pain    Lactose intolerance    Lower extremity edema    Multiple food allergies    OSA (obstructive sleep apnea)    Osteoporosis    Palpitations    Panic disorder    Pharyngoesophageal dysphagia 06/24/2014   Prediabetes 05/11/2019   Protein-calorie malnutrition, mild ([place]) 06/23/2021   Rheumatoid arthritis ([place])    Risk for falls    Sciatica    Severe 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 ([place])    Sleep apnea    no cpap worn   Subclinical hypothyroidism 05/11/2019   Syncope    Syncope and collapse    Thyroid disease    Vasovagal syncope     Vertigo    Vitamin D deficiency    Past Surgical History:  Procedure Laterality Date   ABDOMINAL HYSTERECTOMY  2006   COLONOSCOPY     GASTRIC BYPASS  2008    Allergies  Allergies  Allergen Reactions   Bee Venom Anaphylaxis and Rash   Coconut (Cocos Nucifera) Anaphylaxis and Itching   Coconut Fatty Acids Anaphylaxis   Food Anaphylaxis and Itching    Walnuts - anaphylaxis, itching   Mixed Ragweed Anaphylaxis, Itching and Rash   Molds & Smuts Anaphylaxis   Mushroom Ext Cmplx(Shiitake-Reishi-Mait) Anaphylaxis   Shellfish Allergy Anaphylaxis   Strawberry Extract Anaphylaxis   Latex Rash   Norco [Hydrocodone-Acetaminophen] Rash    History of Present Illness    [name]  is a 57 year old female with the above mention past medical history who presents today for post hospital follow-up of syncope.  [name] was first seen by Dr. Johnsie Cancel in 2010 when she was seen for chest pain and dizziness with DOE and fatigue.  She had a nuclear study completed with normal perfusion and hypotension.  Hx of Vaso-vagal syncope noted after tilt table testing in 2014, ZIO patch in 2018 was normal.  She currently uses a walker due to low back pain and sciatica.  She has had multiple admissions in the past for syncope and presented to the ED on 1/9 with a recurrent episode.  EKG was completed showing sinus rhythm with TWI in leads I, V1 and V2.  2D echo was completed showing EF of 55 to 60% with normal RV size and trivial MR.  She had orthostatics done that were negative.  She was noted to be taking phentermine, doxepin and Nucynta.  Discussion was made regarding discontinuing doxepin and Nucynta and this was completed to reduce her risk of falls.  She was advised to use abdominal binder and hydration.  She injured her left ankle following syncopal episode and is currently unable to bear weight.  Since last being seen in the office patient reports***.  Patient denies chest pain, palpitations, dyspnea, PND,  orthopnea, nausea, vomiting, dizziness, syncope, edema, weight gain, or early satiety.     ***Notes: -Patient needs ETT and 30-day event monitor Home Medications    Current Outpatient Medications  Medication Sig Dispense Refill   acetaminophen (TYLENOL) 500 MG tablet Take 2 tablets (1,000 mg total) by mouth every 6 (six) hours as needed for mild pain, moderate pain, headache or fever. 30 tablet 0   albuterol (VENTOLIN HFA) 108 (90 Base) MCG/ACT inhaler Inhale 2 puffs into the lungs every 6 (six) hours as needed for wheezing. 18 g 2   apixaban (ELIQUIS) 5 MG TABS tablet Take 2 tablets (10 mg total) by mouth 2 (two) times daily for 4 days, THEN 1 tablet (5 mg total) 2 (two) times daily for 21 days. 58 tablet 0   azelastine (ASTELIN) 0.1 % nasal spray Place 2 sprays into both nostrils daily as needed for allergies. Use in each nostril as directed     bisacodyl (DULCOLAX) 10 MG suppository Place 1 suppository (10 mg total) rectally as needed for moderate constipation. 12 suppository 0   diclofenac Sodium (VOLTAREN) 1 % GEL Apply 4 g topically 4 (four) times daily.     divalproex (DEPAKOTE ER) 500 MG 24 hr tablet Take 2 tablets (1,000 mg total) by mouth daily. (Patient taking differently: Take 500 mg by mouth 2 (two) times daily.) 180 tablet 0   EPINEPHrine (EPIPEN 2-PAK) 0.3 mg/0.3 mL IJ SOAJ injection Inject 0.3 mg into the muscle as needed for anaphylaxis.     fluticasone (FLONASE) 50 MCG/ACT nasal spray Place 1 spray into both nostrils as needed (allergies/runny nose). 16 g 0   Galcanezumab-gnlm (EMGALITY) 120 MG/ML SOAJ INJECT 120 MG INTO THE SKIN EVERY 30 DAYS (Patient taking differently: Inject 120 mg into the skin every 30 (thirty) days. 1st Monday of every month) 3 mL 11   lidocaine (LIDODERM) 5 % Place 1-2 patches onto the skin daily as needed (pain). Remove & Discard patch within 12 hours or as directed by MD     MELATONIN PO Take 1 tablet by mouth at bedtime as needed (sleep).      MOUNJARO 10 MG/0.5ML Pen Inject 10 mg into the skin every Wednesday.     Multiple Vitamin (MULTIVITAMIN) tablet Take 1 tablet by mouth in the morning.     No current facility-administered medications for this visit.     Review of Systems  Please see the history of present illness.    (+)*** (+)***  All other systems reviewed and are otherwise negative except as noted above.  Physical Exam    Wt Readings from Last 3 Encounters:  06/04/22 198 lb (89.8 kg)  05/25/22 208 lb 15.9 oz (94.8 kg)  01/28/22 209 lb (94.8 kg)   [id number] were no vitals filed for this visit.,There is no height or weight on file to calculate BMI.  Constitutional:      Appearance: Healthy appearance. Not in distress.  Neck:     Vascular: JVD normal.  Pulmonary:     Effort: Pulmonary effort is normal.     Breath sounds: No wheezing. No rales. Diminished in the bases Cardiovascular:     Normal rate. Regular rhythm. Normal S1. Normal S2.      Murmurs: There is no murmur.  Edema:    Peripheral edema absent.  Abdominal:     Palpations: Abdomen is soft non tender. There is no hepatomegaly.  Skin:    General: Skin is warm and dry.  Neurological:     General: No focal deficit present.     Mental Status: Alert and oriented to person, place and time.     Cranial Nerves: Cranial nerves are intact.  EKG/LABS/Other Studies Reviewed    ECG personally reviewed by me today - ***  Risk Assessment/Calculations:   {Does this patient have ATRIAL FIBRILLATION?:[id number]}        Lab Results  Component Value Date   WBC 5.3 06/03/2022   HGB 10.9 (L) 06/03/2022   HCT 34.5 (L) 06/03/2022   MCV 91.0 06/03/2022   PLT 208 06/03/2022   Lab Results  Component Value Date   CREATININE 0.91 06/02/2022   BUN 20 06/02/2022   NA 137 06/02/2022   K 4.1 06/02/2022   CL 105 06/02/2022   CO2 24 06/02/2022   Lab Results  Component Value Date   ALT 13 06/01/2022   AST 19 06/01/2022   ALKPHOS 57 06/01/2022   BILITOT  0.3 06/01/2022   Lab Results  Component Value Date   CHOL 227 (H) 07/09/2020   HDL 142 07/09/2020   LDLCALC 67 07/09/2020   TRIG 92 10/25/2020   CHOLHDL 1.6 07/09/2020    Lab Results  Component Value Date   HGBA1C 5.3 05/25/2022    Assessment & Plan    1.  Syncope and collapse: -Patient currently***  2. History of pulmonary embolism: -Patient is currently on Eliquis dose of 10 mg twice daily for 1 week and then 5 mg twice daily for 21 days for total of 28 days -  3.  DM type II -  4.***      Disposition: Follow-up with Jenkins Rouge, MD or APP in *** months {Are you ordering a CV Procedure (e.g. stress test, cath, DCCV, TEE, etc)?   Press F2        :[id number]   Medication Adjustments/Labs and Tests Ordered: Current medicines are reviewed at length with the patient today.  Concerns regarding medicines are outlined above.   Signed, Mable Fill, Marissa Nestle, NP 06/17/2022, 8:44 AM [facility]  Note:  This document was prepared using Dragon voice recognition software and may include unintentional dictation errors.

## 2022-06-18 ENCOUNTER — Ambulatory Visit: Payer: 59 | Admitting: Nurse Practitioner

## 2022-06-18 DIAGNOSIS — E119 Type 2 diabetes mellitus without complications: Secondary | ICD-10-CM

## 2022-06-18 DIAGNOSIS — R55 Syncope and collapse: Secondary | ICD-10-CM

## 2022-06-18 DIAGNOSIS — I2693 Single subsegmental pulmonary embolism without acute cor pulmonale: Secondary | ICD-10-CM

## 2022-06-24 ENCOUNTER — Telehealth: Payer: Self-pay | Admitting: Physician Assistant

## 2022-06-24 NOTE — Progress Notes (Deleted)
  Cardiology Office Note:    Date:  06/24/2022   ID:  [name], DOB 08/15/1965, MRN [id number]  PCP:  Berkley Harvey, NP  [place] Providers Cardiologist:  Jenkins Rouge, MD { Click to update primary MD,subspecialty MD or APP then REFRESH:1}  *** Referring MD: Berkley Harvey, NP   Patient Profile: Vasovagal syncope (recurrent - multiple admissions) Tilt Table 04/02/13 ([facility]): no arrhythmias; BP ? to nadir of 54/25, HR 55 w assoc syncope  ETT-Echo 01/11/13 ([facility]): normal  TTE 09/08/15 ([facility]): EF 55-60 TTE 06/02/22: EF 55-60, NL RVSF, NL PASP (RVSP 21.7), trivial MR/AI, RAP 3 Pulmonary embolism (05/2022) Diabetes mellitus  Hypertension  Grave's disease s/p RAI Rx Bipolar disorder Obesity s/p gastric bypass in 2008 Chronic low back pain (uses walker, cane)     History of Present Illness:   [name] is a 57 y.o. female with the above problem list.  She was admitted 1/9-1/12 with recurrent syncope. CT demonstrated a pulmonary embolism with small clot burden. She was started on anticoagulation. BP ranged 99/55-121/63. Workup for adrenal insufficiency neg. She was seen by Dr. Johnsie Cancel in consultation. PE was not large enough to explain syncope. Her EKG did not show prolonged QT or pre-excitation. It was recommended that Nucynta and Doxepin be DCd if possible. It was also recommended to not use Phentermine with Midodrine. TTE showed normal EF. 30 day event monitor was arranged. Pt will need an ETT once she is able to walk (currently has ankle pain). Will need to consider ILR if monitor neg and pt has recurrence (pt preference). Monitor has not yet been started.   She returns for post hospital f/u.***  {EKG done?:28833::"EKG: ***"}    Reviewed and updated this encounter:***     ROS  Labs/Other Test Reviewed:   Recent Labs: 06/01/2022: ALT 13; B Natriuretic Peptide 12.1; Magnesium 2.2; TSH 4.687 06/02/2022: BUN 20; Creatinine, Ser 0.91; Potassium 4.1; Sodium  137 06/03/2022: Hemoglobin 10.9; Platelets 208   Recent Lipid Panel No results for input(s): "CHOL", "TRIG", "HDL", "VLDL", "LDLCALC", "LDLDIRECT" in the last 8760 hours.   Risk Assessment/Calculations/Metrics:   {Does this patient have ATRIAL FIBRILLATION?:[id number]}     No BP recorded.  {Refresh Note OR Click here to enter BP  :1}***   Physical Exam:   VS:  There were no vitals taken for this visit.   Wt Readings from Last 3 Encounters:  06/04/22 198 lb (89.8 kg)  05/25/22 208 lb 15.9 oz (94.8 kg)  01/28/22 209 lb (94.8 kg)    Physical Exam ***     ASSESSMENT & PLAN:   No problem-specific Assessment & Plan notes found for this encounter.       {      :1}   {Are you ordering a CV Procedure (e.g. stress test, cath, DCCV, TEE, etc)?   Press F2        :[id number]}   Dispo:  No follow-ups on file.   Signed, Richardson Dopp, PA-C  06/24/2022 [address] [place], [place], [place]  [address] Phone: [phone]; Fax: [phone]

## 2022-06-24 NOTE — Telephone Encounter (Signed)
-----  Message from Liliane Shi, [place] sent at 06/24/2022 11:36 AM EST ----- Regarding: appt 06/25/22 Pt has f/u with me on Friday for post hospital visit. She was admitted with syncope and is to have a monitor done. Notes in her chart indicate that she is not starting the monitor until next week.  Please see if pt has started wearing the monitor yet. If not, I think it would be better to arrange her f/u after her monitor is completed (in 4-6 weeks). Check with patient. If she is having any new issues or concerns and wants to keep appt, that is ok. Richardson Dopp, PA-C    06/24/2022 11:38 AM

## 2022-06-24 NOTE — Telephone Encounter (Signed)
Left message for patient to callback regarding message below from [facility], [place].

## 2022-06-25 ENCOUNTER — Ambulatory Visit: Payer: 59 | Admitting: Physician Assistant

## 2022-06-25 DIAGNOSIS — R55 Syncope and collapse: Secondary | ICD-10-CM

## 2022-06-28 ENCOUNTER — Ambulatory Visit: Payer: 59 | Attending: Cardiovascular Disease

## 2022-06-28 ENCOUNTER — Telehealth: Payer: Self-pay | Admitting: Adult Health

## 2022-06-28 DIAGNOSIS — R55 Syncope and collapse: Secondary | ICD-10-CM | POA: Diagnosis not present

## 2022-06-28 DIAGNOSIS — G43711 Chronic migraine without aura, intractable, with status migrainosus: Secondary | ICD-10-CM

## 2022-06-28 NOTE — Telephone Encounter (Signed)
Pt has an upcoming botox appointment scheduled for 07/26/22 and will need a new PA before appointment 

## 2022-06-28 NOTE — Telephone Encounter (Signed)
Chronic Migraine CPT 64615  Botox J0585 Units:200  G43.711 Chronic Migraine without aura, intractable, with status migrainous  

## 2022-07-01 ENCOUNTER — Encounter (HOSPITAL_COMMUNITY): Payer: Self-pay

## 2022-07-01 ENCOUNTER — Telehealth (HOSPITAL_COMMUNITY): Payer: 59 | Admitting: Psychiatry

## 2022-07-09 ENCOUNTER — Other Ambulatory Visit (HOSPITAL_COMMUNITY): Payer: Self-pay

## 2022-07-09 NOTE — Telephone Encounter (Signed)
Pharmacy Patient Advocate Encounter   Received notification from [facility] that prior authorization for Botox 200 Units is required/requested.    PA submitted on 07/09/2022 to (ins) [employer] via CoverMyMeds Key [id number] Status is pending   BOTOX ONE-Benefit Verification [id number] Submitted!

## 2022-07-09 NOTE — Telephone Encounter (Signed)
Checking for update on auth, pt scheduled for 07/26/22

## 2022-07-14 NOTE — Telephone Encounter (Signed)
Any updates on auth?

## 2022-07-19 NOTE — Telephone Encounter (Signed)
Additional info faxed to [employer] as expedited. Will update once we receive a response.

## 2022-07-19 NOTE — Telephone Encounter (Signed)
Ok thanks for the update. Will be watching for update.

## 2022-07-19 NOTE — Telephone Encounter (Signed)
PA was denied for Botox-we submitted an expedited appeal because PT did meet the requirements that the denial states that they did not. Will update once we get the answer from the appeal.

## 2022-07-22 ENCOUNTER — Telehealth (HOSPITAL_COMMUNITY): Payer: 59 | Admitting: Psychiatry

## 2022-07-22 ENCOUNTER — Other Ambulatory Visit (HOSPITAL_COMMUNITY): Payer: Self-pay

## 2022-07-22 ENCOUNTER — Other Ambulatory Visit: Payer: Self-pay

## 2022-07-22 ENCOUNTER — Telehealth (HOSPITAL_BASED_OUTPATIENT_CLINIC_OR_DEPARTMENT_OTHER): Payer: 59 | Admitting: Psychiatry

## 2022-07-22 DIAGNOSIS — G4700 Insomnia, unspecified: Secondary | ICD-10-CM | POA: Diagnosis not present

## 2022-07-22 DIAGNOSIS — F411 Generalized anxiety disorder: Secondary | ICD-10-CM

## 2022-07-22 DIAGNOSIS — F4001 Agoraphobia with panic disorder: Secondary | ICD-10-CM | POA: Diagnosis not present

## 2022-07-22 DIAGNOSIS — F3181 Bipolar II disorder: Secondary | ICD-10-CM

## 2022-07-22 MED ORDER — BOTOX 200 UNITS IJ SOLR
INTRAMUSCULAR | 3 refills | Status: DC
  Filled 2022-07-22: qty 1, fill #0
  Filled 2022-07-22: qty 1, 84d supply, fill #0

## 2022-07-22 MED ORDER — DIVALPROEX SODIUM ER 500 MG PO TB24: ORAL_TABLET | ORAL | 0 refills | Status: DC

## 2022-07-22 NOTE — Telephone Encounter (Signed)
Botox 200 unit Rx sent to [facility].

## 2022-07-22 NOTE — Progress Notes (Signed)
Virtual Visit via Video Note  I connected with [name] on 07/22/22 at  3:20 PM EST by  a video enabled telemedicine application and verified that I am speaking with the correct person using two identifiers.  Location: Patient: Home Provider: office   I discussed the limitations of evaluation and management by telemedicine and the availability of in person appointments. The patient expressed understanding and agreed to proceed.  History of Present Illness: [name] is suffering from severe depression. She was passing out more often and ended up in the hospital twice in January 2024. [name] was diagnosed with pulmonary embolism. While inpatient Doxepin, Estrace and Nucynta were discontinued. It was a very scary experience for her.  Her pain doctor has restarted the [place]. Her PCP is not sure if [name] needs to restart the blood thinners. Since her 2nd hospitalization she denies any further syncopal episodes. She is sleeping about 5-6 hrs/night with OTC Melatonin. Her energy is very low. [name] is very frustrated and is upset that her legs look big despite 45 lbs weight loss. She has extra skin from fat loss and it is making her depressed. She doesn't want to go out or anywhere where people can see her. On the rare times she does go out she wears leggings where no one can see her. She stopped water aerobics because of the fear of people seeing and judging her legs. She used to really enjoy going. [name] denies SI/HI. Pt denies recent manic and hypomanic symptoms including periods of decreased need for sleep, increased energy, mood lability, impulsivity, FOI, and excessive spending. Her anxiety is worse. She is having 2 stress induced panic attacks a week. [name]'s appetite is poor but she is trying to eat more each day.      Observations/Objective: Psychiatric Specialty Exam: ROS  There were no vitals taken for this visit.There is no height or weight on file to calculate BMI.  General  Appearance: Casual  Eye Contact:  Fair  Speech:  Clear and Coherent and Normal Rate  Volume:  Normal  Mood:  Anxious and Depressed  Affect:  Congruent  Thought Process:  Coherent and Descriptions of Associations: Circumstantial  Orientation:  Full (Time, Place, and Person)  Thought Content:  Rumination  Suicidal Thoughts:  No  Homicidal Thoughts:  No  Memory:  Immediate;   Poor  Judgement:  Fair  Insight:  Fair  Psychomotor Activity:  Normal  Concentration:  Concentration: Fair  Recall:  [place] of Knowledge:  Good  Language:  Good  Akathisia:  No  Handed:  Right  AIMS (if indicated):     Assets:  Communication Skills Desire for Improvement Financial Resources/Insurance Housing Resilience Social Support Talents/Skills Transportation Vocational/Educational  ADL's:  Intact  Cognition:  WNL  Sleep:        Assessment and Plan:     07/22/2022    3:31 PM 05/20/2022    1:14 PM 03/18/2022    1:19 PM 12/31/2021    1:41 PM 09/17/2021   11:37 AM  Depression screen PHQ 2/9  Decreased Interest 3 0 3 2 0  Down, Depressed, Hopeless '3 2 2 2 1  '$ PHQ - 2 Score '6 2 5 4 1  '$ Altered sleeping 2 0 3 3   Tired, decreased energy '3 3 3 3   '$ Change in appetite '3 3 3 1   '$ Feeling bad or failure about yourself  '3 3 2 2   '$ Trouble concentrating '3 1 2 2   '$ Moving  slowly or fidgety/restless 0 1 0 2   Suicidal thoughts 0 1 0 0   PHQ-9 Score '20 14 18 17   '$ Difficult doing work/chores Extremely dIfficult Very difficult Very difficult Extremely dIfficult     Flowsheet Row Video Visit from 07/22/2022 in [facility] ED to Hosp-Admission (Discharged) from 06/01/2022 in [facility] ED to Hosp-Admission (Discharged) from 05/25/2022 in [facility] No Risk No Risk No Risk          Pt is aware that these meds carry a teratogenic risk. Pt will discuss plan of action if she does or plans to  become pregnant in the future.  Status of current problems: worsening depression and high anxiety   Medication management with supportive therapy. Risks and benefits, side effects and alternative treatment options discussed with patient. Pt was given an opportunity to ask questions about medication, illness, and treatment. All current psychiatric medications have been reviewed and discussed with the patient and adjusted as clinically appropriate.  Pt verbalized understanding and verbal consent obtained for treatment.  Meds: D/C Doxepin in the hospital due to syncope.  Increase Depakote ER '500mg'$  qAM and '1000mg'$  qPM 1. Bipolar II disorder (HCC) - divalproex (DEPAKOTE ER) 500 MG 24 hr tablet; Take 2 tablets (1,000 mg total) by mouth at bedtime AND 1 tablet (500 mg total) daily.  Dispense: 270 tablet; Refill: 0  2. GAD (generalized anxiety disorder)  3. Panic disorder with agoraphobia  4. Insomnia, unspecified type     Labs: 05/26/22 Depakote level 53, 1/9/24AST and ALT WNL 06/03/22 Hb 10.9 and HT 34.5 and platelets normal   Therapy: brief supportive therapy provided. Discussed psychosocial stressors in detail.      Collaboration of Care: Referral or follow-up with counselor/therapist AEB for therapy  Patient/Guardian was advised Release of Information must be obtained prior to any record release in order to collaborate their care with an outside provider. Patient/Guardian was advised if they have not already done so to contact the registration department to sign all necessary forms in order for [name] to release information regarding their care.   Consent: Patient/Guardian gives verbal consent for treatment and assignment of benefits for services provided during this visit. Patient/Guardian expressed understanding and agreed to proceed.   Follow Up Instructions: Follow up in 1 month or sooner if needed    I discussed the assessment and treatment plan with the patient. The patient was  provided an opportunity to ask questions and all were answered. The patient agreed with the plan and demonstrated an understanding of the instructions.   The patient was advised to call back or seek an in-person evaluation if the symptoms worsen or if the condition fails to improve as anticipated.  I provided 20 minutes of non-face-to-face time during this encounter.   Charlcie Cradle, MD

## 2022-07-22 NOTE — Telephone Encounter (Signed)
Can you send Rx to [facility] please? Thank you

## 2022-07-22 NOTE — Telephone Encounter (Signed)
Have there been any updates on appeal?

## 2022-07-22 NOTE — Addendum Note (Signed)
Addended by: Gildardo Griffes on: 07/22/2022 08:55 AM   Modules accepted: Orders

## 2022-07-22 NOTE — Telephone Encounter (Signed)
Pharmacy Patient Advocate Encounter- Botox BIV-Medical Benefit:  J code: [id number] CPT code: [phone] Dx Code: [id number]  PA Appeal was submitted to [facility] and has been approved through: 10/19/2022 Authorization# [id number]  Please send prescription to Specialty Pharmacy: [facility]: [phone]  Estimated Patient cost is: Zero per test claim  Patient Is Not eligible for Botox Copay Card. Copay Card can make patient's cost as little as zero. Copay card will be provided to pharmacy.

## 2022-07-23 ENCOUNTER — Other Ambulatory Visit: Payer: Self-pay | Admitting: Neurology

## 2022-07-26 ENCOUNTER — Ambulatory Visit (INDEPENDENT_AMBULATORY_CARE_PROVIDER_SITE_OTHER): Payer: 59 | Admitting: Adult Health

## 2022-07-26 DIAGNOSIS — G43711 Chronic migraine without aura, intractable, with status migrainosus: Secondary | ICD-10-CM | POA: Diagnosis not present

## 2022-07-26 MED ORDER — ONABOTULINUMTOXINA 200 UNITS IJ SOLR
155.0000 [IU] | Freq: Once | INTRAMUSCULAR | Status: AC
  Administered 2022-07-26: 155 [IU] via INTRAMUSCULAR

## 2022-07-26 NOTE — Progress Notes (Signed)
07/26/22: Reports that she presently gets 2 migraines a week.  Botox has been extremely helpful for her.  She does have to take Xanax before her injections.  04/19/2022: On botox only gets 4 migraines a month. IS VERY ANXIOUS TAKES XANAX before coming in be very gentle with injections   01/19/2022: Super anxious, but doing very well with botox, > 50% improvement in migraine freq and severity. Gave xanax to use prior to injections, discussed risks of taking xanax with nucynta including resp failure and death, discussed take one tablet 30-60 minutes before botox procedure, do not take at any other time, she undertood and agreed. will discuss disposing of any unused medication at next appointment      10/27/2021: stable doing well. She lost 30 pounds, her hair looks great, she had an episode of dizziness and weakness wasn;t eating or drinking after a breakup, she has had some falls due to right leg weakness pending mri lumbar spine we wll see her on the 22nd.     07/21/2021: She gets > 70% relief migraine frequency, extremely anxious, do the botox fast, +a   04/21/2021: stable, still doing same if not better > 60% improvement. She broke up with her boyfriend. Patient states she did not take the xanax, she is here alone, I warned her not to drive if she takes xanax or clonazepam and have someone drive her.    [id number]: getting back on track. Will give some xanax for next time she is incredibly stressed when she comes in, she is lovely, here with fiance   Interval history 04/28/2020: Doing great, More than 60% improvement in frequency but also in severity. Ajovy not approve using emgality instead. she has failed imitrex and maxalt and cannot take anymore triptans, contraindicated due to side effects, gave her some samples. She feels clenching is a problem and she has pain when clenching in the temples as well and we give her muscle relaxers since we cannot put botox there. She had HTN on aimovig, stopped.  She had botox in the past and we restarted on 03/2019, doing great. Loves emgality as an add on as well. No date set for her wedding yet, it has been 2.5 years. She is very anxious and took anxiety medication today and did better but she is very anxious. She wants an outdoor wedding. She did much better, she took her anxiety and pain meds. [facility]    BOTOX PROCEDURE NOTE FOR MIGRAINE HEADACHE    Contraindications and precautions discussed with patient(above). Aseptic procedure was observed and patient tolerated procedure. Procedure performed by Ward Givens, NP  The condition has existed for more than 6 months, and pt does not have a diagnosis of ALS, Myasthenia Gravis or Lambert-Eaton Syndrome.  Risks and benefits of injections discussed and pt agrees to proceed with the procedure.  Written consent obtained  These injections are medically necessary. These injections do not cause sedations or hallucinations which the oral therapies may cause.  Indication/Diagnosis: chronic migraine BOTOX(J0585) injection was performed according to protocol by [employer]. 200 units of BOTOX was dissolved into 4 cc NS.   NDC: [id number]  Type of toxin: Botox  Botox- 200 units x 1 vial Lot: [id number] Expiration: 10/2024 NDC: [id number]   Bacteriostatic 0.9% Sodium Chloride- 6m total Lot: [id number]: 11/25 NDC: [id number]  Dx: [id number]  Description of procedure:  The patient was placed in a sitting position. The standard protocol  was used for Botox as follows, with 5 units of Botox injected at each site:   -Procerus muscle, midline injection  -Corrugator muscle, bilateral injection  -Frontalis muscle, bilateral injection, with 2 sites each side, medial injection was performed in the upper one third of the frontalis muscle, in the region vertical from the medial inferior edge of the superior orbital rim. The lateral injection  was again in the upper one third of the forehead vertically above the lateral limbus of the cornea, 1.5 cm lateral to the medial injection site.  -Temporalis muscle injection, 4 sites, bilaterally. The first injection was 3 cm above the tragus of the ear, second injection site was 1.5 cm to 3 cm up from the first injection site in line with the tragus of the ear. The third injection site was 1.5-3 cm forward between the first 2 injection sites. The fourth injection site was 1.5 cm posterior to the second injection site.  -Occipitalis muscle injection, 3 sites, bilaterally. The first injection was done one half way between the occipital protuberance and the tip of the mastoid process behind the ear. The second injection site was done lateral and superior to the first, 1 fingerbreadth from the first injection. The third injection site was 1 fingerbreadth superiorly and medially from the first injection site.  -Cervical paraspinal muscle injection, 2 sites, bilateral knee first injection site was 1 cm from the midline of the cervical spine, 3 cm inferior to the lower border of the occipital protuberance. The second injection site was 1.5 cm superiorly and laterally to the first injection site.  -Trapezius muscle injection was performed at 3 sites, bilaterally. The first injection site was in the upper trapezius muscle halfway between the inflection point of the neck, and the acromion. The second injection site was one half way between the acromion and the first injection site. The third injection was done between the first injection site and the inflection point of the neck.   Will return for repeat injection in 3 months.   A 200 unit sof Botox was used, 155 units were injected, the rest of the Botox was wasted. The patient tolerated the procedure well, there were no complications of the above procedure.  Ward Givens, MSN, NP-C 07/26/2022, 3:05 PM [facility] [address] [place],  [address] [phone]

## 2022-07-26 NOTE — Progress Notes (Signed)
Botox- 200 units x 1 vial Lot: [id number] Expiration: 10/2024 NDC: [id number]  Bacteriostatic 0.9% Sodium Chloride- 74m total Lot: [id number]: 11/25 NDC: [id number] Dx: [id number]/P

## 2022-07-29 ENCOUNTER — Ambulatory Visit (HOSPITAL_BASED_OUTPATIENT_CLINIC_OR_DEPARTMENT_OTHER): Payer: 59 | Admitting: Physical Therapy

## 2022-08-03 ENCOUNTER — Telehealth: Payer: Self-pay | Admitting: Neurology

## 2022-08-03 DIAGNOSIS — G43711 Chronic migraine without aura, intractable, with status migrainosus: Secondary | ICD-10-CM

## 2022-08-03 NOTE — Telephone Encounter (Signed)
Pt is asking for a call to discuss day 16 of her migraines with no major relief .

## 2022-08-04 ENCOUNTER — Other Ambulatory Visit: Payer: Self-pay | Admitting: *Deleted

## 2022-08-04 ENCOUNTER — Encounter: Payer: Self-pay | Admitting: *Deleted

## 2022-08-04 NOTE — Telephone Encounter (Signed)
I spoke with the patient.  She only takes Tylenol as needed. She takes Nucynta for her back PRN but that doesn't help the migraine. She states she has had a migraine since before her last Botox 07/26/2022. She has not gone to urgent care for it but is trying to manage at home. Patient states this is a typical stubborn migraine, no concerning or new symptoms. The patient mentioned that she was in the hospital in January for a pulmonary embolism and was placed on Eliquis. Patient denies any falls or hitting her head since starting Eliquis. Patient denies any known allergies to steroids. She states she has taken prednisone in the past. She most recently took it in December but she stopped it because she gained weight. Patient also mentioned that for a long time when she is lying down and lifts her head and turns it (left or right) she gets a sharp pain over her left eye. She states she has mentioned it to PCP before and possibly Dr Jaynee Eagles, but she hasn't followed up on it. I told the patient I would check with Dr Jaynee Eagles tomorrow about her questions when the office reopens. In the meantime if she worsens or develops any new symptoms please go to ER. Patient was appreciative.

## 2022-08-05 MED ORDER — NURTEC 75 MG PO TBDP: 75.0000 mg | ORAL_TABLET | Freq: Every day | ORAL | 0 refills | Status: DC

## 2022-08-05 NOTE — Addendum Note (Signed)
Addended by: Skeet Simmer A on: 08/05/2022 04:24 PM   Modules accepted: Orders

## 2022-08-05 NOTE — Telephone Encounter (Signed)
We have some nurtec samples we could give her 8 and tell her ot take one every day to try and break the cycle. If she can get here before 5 or pick up in the morning?

## 2022-08-05 NOTE — Telephone Encounter (Addendum)
Spoke to Patient can pick up samples of Nurtec tommrrow 08/05/2022 before 12n  Informed pt to take 1 tablet daily until Migraine breaks Pt expressed understanding and thanked me for calling Placed Nurtec  samples at front desk

## 2022-08-19 ENCOUNTER — Telehealth (HOSPITAL_BASED_OUTPATIENT_CLINIC_OR_DEPARTMENT_OTHER): Payer: 59 | Admitting: Psychiatry

## 2022-08-19 DIAGNOSIS — G4700 Insomnia, unspecified: Secondary | ICD-10-CM

## 2022-08-19 DIAGNOSIS — Z5181 Encounter for therapeutic drug level monitoring: Secondary | ICD-10-CM | POA: Diagnosis not present

## 2022-08-19 DIAGNOSIS — F3181 Bipolar II disorder: Secondary | ICD-10-CM | POA: Diagnosis not present

## 2022-08-19 DIAGNOSIS — F411 Generalized anxiety disorder: Secondary | ICD-10-CM

## 2022-08-19 DIAGNOSIS — F4001 Agoraphobia with panic disorder: Secondary | ICD-10-CM

## 2022-08-19 MED ORDER — DIVALPROEX SODIUM ER 500 MG PO TB24: ORAL_TABLET | ORAL | 0 refills | Status: DC

## 2022-08-19 NOTE — Progress Notes (Signed)
Virtual Visit via Video Note  I connected with [name] on 08/19/22 at  2:00 PM EDT by a video enabled telemedicine application and verified that I am speaking with the correct person using two identifiers.  Location: Patient: was driving but moved off the highway and parked her car per my request Provider: office   I discussed the limitations of evaluation and management by telemedicine and the availability of in person appointments. The patient expressed understanding and agreed to proceed.  History of Present Illness: [name] is on her way to get her hair done but agreed to park while we spoke. [name] is part of the claim from [facility] for contaminated water from [phone].  [name] was there was from [phone]. She is working with her doctors. [name] is doing a little bit better. She is not as stressed or tired as before. She just found out her biological father passed away 4 years ago. The last conversation they had was upsetting. The news has made her depressed. The level is about 6/10 (10 being the worst). She denies passive and active SI/HI. Pt denies recent manic and hypomanic symptoms including periods of decreased need for sleep, increased energy, mood lability, impulsivity, FOI, and excessive spending. [name] is having broken sleep. She will sleep for 4-5 hours and wakes up from dreams and then she is able to sleep for 2-3 hrs. She is tired all day but awake at night. She is working on sleep schedule. Her anxiety and panic attacks are ok because she has been keeping more to herself lately.    Observations/Objective: Psychiatric Specialty Exam: ROS  There were no vitals taken for this visit.There is no height or weight on file to calculate BMI.  General Appearance: Neat and Well Groomed  Eye Contact:  Good  Speech:  Clear and Coherent and Normal Rate  Volume:  Normal  Mood:  Anxious and Depressed  Affect:  Congruent  Thought Process:  Goal Directed, Linear, and  Descriptions of Associations: Intact  Orientation:  Full (Time, Place, and Person)  Thought Content:  Logical  Suicidal Thoughts:  No  Homicidal Thoughts:  No  Memory:  Immediate;   Good  Judgement:  Good  Insight:  Good  Psychomotor Activity:  Normal  Concentration:  Concentration: Good  Recall:  Good  Fund of Knowledge:  Good  Language:  Good  Akathisia:  No  Handed:  Right  AIMS (if indicated):     Assets:  Communication Skills Desire for Improvement Financial Resources/Insurance Housing Social Support Talents/Skills Transportation Vocational/Educational  ADL's:  Intact  Cognition:  WNL  Sleep:        Assessment and Plan:     07/22/2022    3:31 PM 05/20/2022    1:14 PM 03/18/2022    1:19 PM 12/31/2021    1:41 PM 09/17/2021   11:37 AM  Depression screen PHQ 2/9  Decreased Interest 3 0 3 2 0  Down, Depressed, Hopeless 3 2 2 2 1   PHQ - 2 Score 6 2 5 4 1   Altered sleeping 2 0 3 3   Tired, decreased energy 3 3 3 3    Change in appetite 3 3 3 1    Feeling bad or failure about yourself  3 3 2 2    Trouble concentrating 3 1 2 2    Moving slowly or fidgety/restless 0 1 0 2   Suicidal thoughts 0 1 0 0   PHQ-9 Score 20 14 18 17    Difficult doing work/chores Extremely  dIfficult Very difficult Very difficult Extremely dIfficult     Flowsheet Row Video Visit from 07/22/2022 in [facility] ED to Hosp-Admission (Discharged) from 06/01/2022 in [facility] ED to Hosp-Admission (Discharged) from 05/25/2022 in [place] No Risk No Risk No Risk          Pt is aware that these meds carry a teratogenic risk. Pt will discuss plan of action if she does or plans to become pregnant in the future.  Status of current problems: mild worsening of depression due to situational stressors. Ongoing insomnia- she is using sleep hygiene and melatonin   Medication management with  supportive therapy. Risks and benefits, side effects and alternative treatment options discussed with patient. Pt was given an opportunity to ask questions about medication, illness, and treatment. All current psychiatric medications have been reviewed and discussed with the patient and adjusted as clinically appropriate.  Pt verbalized understanding and verbal consent obtained for treatment.  Meds:  1. Bipolar II disorder (HCC) - divalproex (DEPAKOTE ER) 500 MG 24 hr tablet; Take 2 tablets (1,000 mg total) by mouth at bedtime AND 1 tablet (500 mg total) daily.  Dispense: 270 tablet; Refill: 0  2. Insomnia, unspecified type  3. GAD (generalized anxiety disorder)  4. Panic disorder with agoraphobia  5. Encounter for therapeutic drug monitoring - CBC with Differential - Valproic acid level - Comprehensive Metabolic Panel (CMET)        Therapy: brief supportive therapy provided. Discussed psychosocial stressors in detail.    Collaboration of Care: Other none  Patient/Guardian was advised Release of Information must be obtained prior to any record release in order to collaborate their care with an outside provider. Patient/Guardian was advised if they have not already done so to contact the registration department to sign all necessary forms in order for [name] to release information regarding their care.   Consent: Patient/Guardian gives verbal consent for treatment and assignment of benefits for services provided during this visit. Patient/Guardian expressed understanding and agreed to proceed.     Follow Up Instructions: Follow up in 2-3 months or sooner if needed    I discussed the assessment and treatment plan with the patient. The patient was provided an opportunity to ask questions and all were answered. The patient agreed with the plan and demonstrated an understanding of the instructions.   The patient was advised to call back or seek an in-person evaluation if the symptoms  worsen or if the condition fails to improve as anticipated.  I provided 8 minutes of non-face-to-face time during this encounter.   Charlcie Cradle, MD

## 2022-08-25 ENCOUNTER — Ambulatory Visit (HOSPITAL_BASED_OUTPATIENT_CLINIC_OR_DEPARTMENT_OTHER): Payer: 59 | Admitting: Physical Therapy

## 2022-08-28 ENCOUNTER — Other Ambulatory Visit (HOSPITAL_COMMUNITY): Payer: Self-pay | Admitting: Psychiatry

## 2022-08-28 DIAGNOSIS — F3181 Bipolar II disorder: Secondary | ICD-10-CM

## 2022-09-14 ENCOUNTER — Ambulatory Visit: Payer: 59 | Admitting: Physician Assistant

## 2022-09-14 NOTE — Progress Notes (Deleted)
Cardiology Office Note:    Date:  09/14/2022   ID:  [name], DOB August 08, 1965, MRN [id number]  PCP:  Colleen Hansen, NP   [facility] Providers Cardiologist:  Colleen Haws, MD   Referring MD: Colleen Hansen, NP   No chief complaint on file. ***  History of Present Illness:    [name] is a 57 y.o. female with a hx of vasovagal syncope, diabetes, hypertension, bipolar disorder, Graves' disease s/p radioactive iodine therapy, rheumatoid arthritis, history of gastric bypass, PE (05/2022).  Patient is followed by Dr. Eden Emms and presents today for hospital follow up  Per chart review, patient was admitted to [facility] from 1/9 - 06/04/2022 after patient had a syncopal episode.  That admission, CTA chest showed an acute PE with small thrombus burden.  PE was treated with Eliquis.  Cardiology was consulted for evaluation of syncope.  Echocardiogram on 06/02/2022 showed EF 55-60%, normal LV diastolic parameters, normal RV systolic function, normal pulmonary artery systolic pressure.  After discharge, patient wore a cardiac monitor that showed no significant arrhythmias.   Today, patient   History of syncope   History of PE     Past Medical History:  Diagnosis Date   Acute kidney injury (HCC) 10/25/2020   Acute respiratory disease due to COVID-19 virus 10/25/2020   AKI (acute kidney injury) (HCC) 10/25/2020   Allergic rhinitis    Allergic to cats    pet dander   Allergy    Anemia    Anxiety    Arthritis 09/2018   both feet   Asthma    Back pain    Bipolar disorder (HCC)    Cervicalgia    Chronic fatigue syndrome    Chronic low back pain with left-sided sciatica    Class 3 severe obesity with serious comorbidity and body mass index (BMI) of 40.0 to 44.9 in adult [facility]) 11/14/2014   Constipation    Depression    Diabetes mellitus without complication (HCC)    "pre"   Dyspnea    Environmental allergies    Fibromyalgia    GERD (gastroesophageal  reflux disease)    Grave's disease    Heart murmur    High cholesterol    History of blood clots    Hypertension    Joint pain    Lactose intolerance    Lower extremity edema    Multiple food allergies    OSA (obstructive sleep apnea)    Osteoporosis    Palpitations    Panic disorder    Pharyngoesophageal dysphagia 06/24/2014   Prediabetes 05/11/2019   Protein-calorie malnutrition, mild (HCC) 06/23/2021   Pulmonary embolism (HCC) 2024   Rheumatoid arthritis (HCC)    Risk for falls    Sciatica    Severe 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 (HCC)    Sleep apnea    no cpap worn   Subclinical hypothyroidism 05/11/2019   Syncope    Syncope and collapse    Thyroid disease    Vasovagal syncope    Vertigo    Vitamin D deficiency     Past Surgical History:  Procedure Laterality Date   ABDOMINAL HYSTERECTOMY  2006   COLONOSCOPY     GASTRIC BYPASS  2008    Current Medications: No outpatient medications have been marked as taking for the 09/14/22 encounter (Appointment) with Tereso Newcomer T, PA-C.     Allergies:   Bee venom, Coconut (cocos nucifera), Coconut fatty acids, Food, Mixed  ragweed, Molds & smuts, Mushroom ext cmplx(shiitake-reishi-mait), Shellfish allergy, Strawberry extract, Latex, and Norco [hydrocodone-acetaminophen]   Social History   Socioeconomic History   Marital status: Divorced    Spouse name: Not on file   Number of children: 2   Years of education: 14   Highest education level: Not on file  Occupational History   Occupation: Disabled  Tobacco Use   Smoking status: Never   Smokeless tobacco: Never  Vaping Use   Vaping Use: Never used  Substance and Sexual Activity   Alcohol use: Not Currently    Comment: 1-2 drinks/month   Drug use: No   Sexual activity: Not on file  Other Topics Concern   Not on file  Social History Narrative   Son lives with her. Caffeine use: 1 cup/day of soda   Social Determinants of Health    Financial Resource Strain: Not on file  Food Insecurity: No Food Insecurity (06/04/2022)   Hunger Vital Sign    Worried About Running Out of Food in the Last Year: Never true    Ran Out of Food in the Last Year: Never true  Transportation Needs: No Transportation Needs (06/04/2022)   PRAPARE - Administrator, Civil Service (Medical): No    Lack of Transportation (Non-Medical): No  Physical Activity: Not on file  Stress: Not on file  Social Connections: Not on file     Family History: The patient's ***family history includes ADD / ADHD in an other family member; Alcohol abuse in her father, mother, and sister; Anxiety disorder in her mother and sister; Arthritis in her father and mother; Breast cancer in her cousin and cousin; COPD in her father; Cirrhosis in her mother; Colon cancer in her maternal uncle; Depression in her father, mother, sister, and sister; Diabetes in her maternal uncle and another family member; Drug abuse in her father and sister; Gout in her father; Heart attack in her maternal grandmother; Hyperlipidemia in her father and mother; Hypertension in her father, mother, sister, and another family member; OCD in her sister; Physical abuse in her mother; Seizures in her cousin; Stroke in her maternal grandmother and maternal uncle; Thyroid disease in her mother, sister, and sister. There is no history of Dementia, Esophageal cancer, Rectal cancer, or Stomach cancer.  ROS:   Please see the history of present illness.    *** All other systems reviewed and are negative.  EKGs/Labs/Other Studies Reviewed:    The following studies were reviewed today: ***  EKG:  EKG is *** ordered today.  The ekg ordered today demonstrates ***  Recent Labs: 06/01/2022: ALT 13; B Natriuretic Peptide 12.1; Magnesium 2.2; TSH 4.687 06/02/2022: BUN 20; Creatinine, Ser 0.91; Potassium 4.1; Sodium 137 06/03/2022: Hemoglobin 10.9; Platelets 208  Recent Lipid Panel    Component Value  Date/Time   CHOL 227 (H) 07/09/2020 1536   TRIG 92 10/25/2020 1940   HDL 142 07/09/2020 1536   CHOLHDL 1.6 07/09/2020 1536   LDLCALC 67 07/09/2020 1536     Risk Assessment/Calculations:   {Does this patient have ATRIAL FIBRILLATION?:[phone]}  No BP recorded.  {Refresh Note OR Click here to enter BP  :1}***         Physical Exam:    VS:  There were no vitals taken for this visit.    Wt Readings from Last 3 Encounters:  06/04/22 198 lb (89.8 kg)  05/25/22 208 lb 15.9 oz (94.8 kg)  01/28/22 209 lb (94.8 kg)     GEN: ***  Well nourished, well developed in no acute distress HEENT: Normal NECK: No JVD; No carotid bruits LYMPHATICS: No lymphadenopathy CARDIAC: ***RRR, no murmurs, rubs, gallops RESPIRATORY:  Clear to auscultation without rales, wheezing or rhonchi  ABDOMEN: Soft, non-tender, non-distended MUSCULOSKELETAL:  No edema; No deformity  SKIN: Warm and dry NEUROLOGIC:  Alert and oriented x 3 PSYCHIATRIC:  Normal affect   ASSESSMENT:    No diagnosis found. PLAN:    In order of problems listed above:  ***      {Are you ordering a CV Procedure (e.g. stress test, cath, DCCV, TEE, etc)?   Press F2        :[id number]}    Medication Adjustments/Labs and Tests Ordered: Current medicines are reviewed at length with the patient today.  Concerns regarding medicines are outlined above.  No orders of the defined types were placed in this encounter.  No orders of the defined types were placed in this encounter.   There are no Patient Instructions on file for this visit.   Signed, Jonita Albee, PA-C  09/14/2022 10:56 AM    [facility]

## 2022-09-24 ENCOUNTER — Ambulatory Visit (HOSPITAL_BASED_OUTPATIENT_CLINIC_OR_DEPARTMENT_OTHER): Payer: 59 | Admitting: Physical Therapy

## 2022-09-27 ENCOUNTER — Encounter (HOSPITAL_COMMUNITY): Payer: Self-pay

## 2022-10-02 ENCOUNTER — Inpatient Hospital Stay (HOSPITAL_COMMUNITY)
Admission: EM | Admit: 2022-10-02 | Discharge: 2022-10-06 | DRG: 419 | Disposition: A | Discharge status: Home / Self Care | Payer: 59 | Attending: Internal Medicine | Admitting: Internal Medicine

## 2022-10-02 ENCOUNTER — Emergency Department (HOSPITAL_COMMUNITY): Payer: 59

## 2022-10-02 ENCOUNTER — Encounter (HOSPITAL_COMMUNITY): Payer: Self-pay

## 2022-10-02 ENCOUNTER — Other Ambulatory Visit: Payer: Self-pay

## 2022-10-02 DIAGNOSIS — Z7985 Long-term (current) use of injectable non-insulin antidiabetic drugs: Secondary | ICD-10-CM

## 2022-10-02 DIAGNOSIS — M810 Age-related osteoporosis without current pathological fracture: Secondary | ICD-10-CM | POA: Diagnosis present

## 2022-10-02 DIAGNOSIS — Z91048 Other nonmedicinal substance allergy status: Secondary | ICD-10-CM | POA: Diagnosis not present

## 2022-10-02 DIAGNOSIS — G4733 Obstructive sleep apnea (adult) (pediatric): Secondary | ICD-10-CM | POA: Diagnosis present

## 2022-10-02 DIAGNOSIS — Z9884 Bariatric surgery status: Secondary | ICD-10-CM | POA: Diagnosis not present

## 2022-10-02 DIAGNOSIS — Z91040 Latex allergy status: Secondary | ICD-10-CM

## 2022-10-02 DIAGNOSIS — E0500 Thyrotoxicosis with diffuse goiter without thyrotoxic crisis or storm: Secondary | ICD-10-CM | POA: Diagnosis present

## 2022-10-02 DIAGNOSIS — K810 Acute cholecystitis: Secondary | ICD-10-CM | POA: Diagnosis not present

## 2022-10-02 DIAGNOSIS — E119 Type 2 diabetes mellitus without complications: Secondary | ICD-10-CM | POA: Diagnosis present

## 2022-10-02 DIAGNOSIS — Z811 Family history of alcohol abuse and dependence: Secondary | ICD-10-CM

## 2022-10-02 DIAGNOSIS — Z91030 Bee allergy status: Secondary | ICD-10-CM | POA: Diagnosis not present

## 2022-10-02 DIAGNOSIS — K8000 Calculus of gallbladder with acute cholecystitis without obstruction: Secondary | ICD-10-CM | POA: Diagnosis not present

## 2022-10-02 DIAGNOSIS — Z8616 Personal history of COVID-19: Secondary | ICD-10-CM

## 2022-10-02 DIAGNOSIS — Z86711 Personal history of pulmonary embolism: Secondary | ICD-10-CM

## 2022-10-02 DIAGNOSIS — K8012 Calculus of gallbladder with acute and chronic cholecystitis without obstruction: Secondary | ICD-10-CM | POA: Diagnosis present

## 2022-10-02 DIAGNOSIS — K219 Gastro-esophageal reflux disease without esophagitis: Secondary | ICD-10-CM | POA: Diagnosis present

## 2022-10-02 DIAGNOSIS — Z8249 Family history of ischemic heart disease and other diseases of the circulatory system: Secondary | ICD-10-CM

## 2022-10-02 DIAGNOSIS — Z813 Family history of other psychoactive substance abuse and dependence: Secondary | ICD-10-CM

## 2022-10-02 DIAGNOSIS — Z803 Family history of malignant neoplasm of breast: Secondary | ICD-10-CM

## 2022-10-02 DIAGNOSIS — E669 Obesity, unspecified: Secondary | ICD-10-CM | POA: Diagnosis present

## 2022-10-02 DIAGNOSIS — Z800 Family history of malignant neoplasm of digestive organs: Secondary | ICD-10-CM

## 2022-10-02 DIAGNOSIS — Z86718 Personal history of other venous thrombosis and embolism: Secondary | ICD-10-CM

## 2022-10-02 DIAGNOSIS — Z91013 Allergy to seafood: Secondary | ICD-10-CM

## 2022-10-02 DIAGNOSIS — Z9102 Food additives allergy status: Secondary | ICD-10-CM

## 2022-10-02 DIAGNOSIS — G43909 Migraine, unspecified, not intractable, without status migrainosus: Secondary | ICD-10-CM | POA: Diagnosis present

## 2022-10-02 DIAGNOSIS — Z9109 Other allergy status, other than to drugs and biological substances: Secondary | ICD-10-CM

## 2022-10-02 DIAGNOSIS — Z820 Family history of epilepsy and other diseases of the nervous system: Secondary | ICD-10-CM

## 2022-10-02 DIAGNOSIS — Z885 Allergy status to narcotic agent status: Secondary | ICD-10-CM | POA: Diagnosis not present

## 2022-10-02 DIAGNOSIS — I10 Essential (primary) hypertension: Secondary | ICD-10-CM | POA: Diagnosis present

## 2022-10-02 DIAGNOSIS — Z83438 Family history of other disorder of lipoprotein metabolism and other lipidemia: Secondary | ICD-10-CM

## 2022-10-02 DIAGNOSIS — M797 Fibromyalgia: Secondary | ICD-10-CM | POA: Diagnosis present

## 2022-10-02 DIAGNOSIS — Z6831 Body mass index (BMI) 31.0-31.9, adult: Secondary | ICD-10-CM

## 2022-10-02 DIAGNOSIS — Z818 Family history of other mental and behavioral disorders: Secondary | ICD-10-CM

## 2022-10-02 DIAGNOSIS — Z91018 Allergy to other foods: Secondary | ICD-10-CM | POA: Diagnosis not present

## 2022-10-02 DIAGNOSIS — Z8349 Family history of other endocrine, nutritional and metabolic diseases: Secondary | ICD-10-CM

## 2022-10-02 DIAGNOSIS — Z823 Family history of stroke: Secondary | ICD-10-CM

## 2022-10-02 DIAGNOSIS — Z79899 Other long term (current) drug therapy: Secondary | ICD-10-CM

## 2022-10-02 DIAGNOSIS — E038 Other specified hypothyroidism: Secondary | ICD-10-CM | POA: Diagnosis present

## 2022-10-02 DIAGNOSIS — K8050 Calculus of bile duct without cholangitis or cholecystitis without obstruction: Secondary | ICD-10-CM | POA: Diagnosis not present

## 2022-10-02 DIAGNOSIS — M069 Rheumatoid arthritis, unspecified: Secondary | ICD-10-CM | POA: Diagnosis present

## 2022-10-02 DIAGNOSIS — J45909 Unspecified asthma, uncomplicated: Secondary | ICD-10-CM | POA: Diagnosis present

## 2022-10-02 DIAGNOSIS — F319 Bipolar disorder, unspecified: Secondary | ICD-10-CM | POA: Diagnosis present

## 2022-10-02 DIAGNOSIS — E7800 Pure hypercholesterolemia, unspecified: Secondary | ICD-10-CM | POA: Diagnosis present

## 2022-10-02 DIAGNOSIS — Z833 Family history of diabetes mellitus: Secondary | ICD-10-CM

## 2022-10-02 DIAGNOSIS — Z8261 Family history of arthritis: Secondary | ICD-10-CM

## 2022-10-02 DIAGNOSIS — G4730 Sleep apnea, unspecified: Secondary | ICD-10-CM | POA: Diagnosis not present

## 2022-10-02 DIAGNOSIS — M79662 Pain in left lower leg: Secondary | ICD-10-CM | POA: Diagnosis not present

## 2022-10-02 DIAGNOSIS — Z7901 Long term (current) use of anticoagulants: Secondary | ICD-10-CM

## 2022-10-02 DIAGNOSIS — Z825 Family history of asthma and other chronic lower respiratory diseases: Secondary | ICD-10-CM

## 2022-10-02 HISTORY — DX: Other pulmonary embolism without acute cor pulmonale: I26.99

## 2022-10-02 LAB — COMPREHENSIVE METABOLIC PANEL
ALT: 15 U/L (ref 0–44)
AST: 18 U/L (ref 15–41)
Albumin: 3.1 g/dL — ABNORMAL LOW (ref 3.5–5.0)
Alkaline Phosphatase: 81 U/L (ref 38–126)
Anion gap: 10 (ref 5–15)
BUN: 23 mg/dL — ABNORMAL HIGH (ref 6–20)
CO2: 24 mmol/L (ref 22–32)
Calcium: 8.6 mg/dL — ABNORMAL LOW (ref 8.9–10.3)
Chloride: 103 mmol/L (ref 98–111)
Creatinine, Ser: 1 mg/dL (ref 0.44–1.00)
GFR, Estimated: >60 mL/min (ref >60)
Glucose, Bld: 93 mg/dL (ref 70–99)
Potassium: 3.8 mmol/L (ref 3.5–5.1)
Sodium: 137 mmol/L (ref 135–145)
Total Bilirubin: 0.4 mg/dL (ref 0.3–1.2)
Total Protein: 6.8 g/dL (ref 6.5–8.1)

## 2022-10-02 LAB — URINALYSIS, ROUTINE W REFLEX MICROSCOPIC
Bilirubin Urine: NEGATIVE
Glucose, UA: NEGATIVE mg/dL
Hgb urine dipstick: NEGATIVE
Ketones, ur: NEGATIVE mg/dL
Leukocytes,Ua: NEGATIVE
Nitrite: NEGATIVE
Protein, ur: 30 mg/dL — AB
Specific Gravity, Urine: 1.023 (ref 1.005–1.030)
pH: 5 (ref 5.0–8.0)

## 2022-10-02 LAB — APTT: aPTT: 35 seconds (ref 24–36)

## 2022-10-02 LAB — HEPARIN LEVEL (UNFRACTIONATED): Heparin Unfractionated: >1.1 IU/mL — ABNORMAL HIGH (ref 0.30–0.70)

## 2022-10-02 LAB — CBC
HCT: 37.8 % (ref 36.0–46.0)
Hemoglobin: 12 g/dL (ref 12.0–15.0)
MCH: 29.3 pg (ref 26.0–34.0)
MCHC: 31.7 g/dL (ref 30.0–36.0)
MCV: 92.4 fL (ref 80.0–100.0)
Platelets: 257 10*3/uL (ref 150–400)
RBC: 4.09 MIL/uL (ref 3.87–5.11)
RDW: 14.4 % (ref 11.5–15.5)
WBC: 14.8 10*3/uL — ABNORMAL HIGH (ref 4.0–10.5)
nRBC: 0 % (ref 0.0–0.2)

## 2022-10-02 LAB — LIPASE, BLOOD: Lipase: 22 U/L (ref 11–51)

## 2022-10-02 MED ORDER — HYDROXYZINE HCL 10 MG PO TABS
10.0000 mg | ORAL_TABLET | Freq: Three times a day (TID) | ORAL | Status: DC | PRN
  Administered 2022-10-02: 10 mg via ORAL
  Filled 2022-10-02: qty 1

## 2022-10-02 MED ORDER — RIMEGEPANT SULFATE 75 MG PO TBDP: 75.0000 mg | ORAL_TABLET | ORAL | Status: DC | PRN

## 2022-10-02 MED ORDER — ALBUTEROL SULFATE (2.5 MG/3ML) 0.083% IN NEBU: 3.0000 mL | INHALATION_SOLUTION | Freq: Four times a day (QID) | RESPIRATORY_TRACT | Status: DC | PRN

## 2022-10-02 MED ORDER — HEPARIN (PORCINE) 25000 UT/250ML-% IV SOLN
1050.0000 [IU]/h | INTRAVENOUS | Status: AC
  Administered 2022-10-02: 1400 [IU]/h via INTRAVENOUS
  Administered 2022-10-03: 1250 [IU]/h via INTRAVENOUS
  Filled 2022-10-02 (×2): qty 250

## 2022-10-02 MED ORDER — MORPHINE SULFATE (PF) 4 MG/ML IV SOLN
4.0000 mg | Freq: Once | INTRAVENOUS | Status: AC
  Administered 2022-10-02: 4 mg via INTRAVENOUS
  Filled 2022-10-02: qty 1

## 2022-10-02 MED ORDER — ONDANSETRON HCL 4 MG PO TABS: 4.0000 mg | ORAL_TABLET | Freq: Four times a day (QID) | ORAL | Status: DC | PRN

## 2022-10-02 MED ORDER — ACETAMINOPHEN 325 MG PO TABS
650.0000 mg | ORAL_TABLET | Freq: Three times a day (TID) | ORAL | Status: DC
  Administered 2022-10-02 – 2022-10-04 (×7): 650 mg via ORAL
  Filled 2022-10-02 (×8): qty 2

## 2022-10-02 MED ORDER — DIVALPROEX SODIUM ER 500 MG PO TB24
500.0000 mg | ORAL_TABLET | Freq: Every day | ORAL | Status: DC
  Administered 2022-10-02 – 2022-10-06 (×5): 500 mg via ORAL
  Filled 2022-10-02 (×5): qty 1

## 2022-10-02 MED ORDER — HYDROMORPHONE HCL 1 MG/ML IJ SOLN
0.5000 mg | INTRAMUSCULAR | Status: DC | PRN
  Administered 2022-10-02 – 2022-10-04 (×4): 1 mg via INTRAVENOUS
  Filled 2022-10-02 (×4): qty 1

## 2022-10-02 MED ORDER — MONTELUKAST SODIUM 10 MG PO TABS: 10.0000 mg | ORAL_TABLET | Freq: Every evening | ORAL | Status: DC | PRN

## 2022-10-02 MED ORDER — ONDANSETRON HCL 4 MG/2ML IJ SOLN
4.0000 mg | Freq: Once | INTRAMUSCULAR | Status: AC
  Administered 2022-10-02: 4 mg via INTRAVENOUS
  Filled 2022-10-02: qty 2

## 2022-10-02 MED ORDER — SODIUM CHLORIDE 0.9 % IV SOLN
2.0000 g | Freq: Once | INTRAVENOUS | Status: AC
  Administered 2022-10-02: 2 g via INTRAVENOUS
  Filled 2022-10-02: qty 20

## 2022-10-02 MED ORDER — TAPENTADOL HCL 50 MG PO TABS
75.0000 mg | ORAL_TABLET | Freq: Two times a day (BID) | ORAL | Status: DC | PRN
  Administered 2022-10-03 – 2022-10-04 (×2): 75 mg via ORAL
  Filled 2022-10-02 (×3): qty 2

## 2022-10-02 MED ORDER — ONDANSETRON HCL 4 MG/2ML IJ SOLN
4.0000 mg | Freq: Four times a day (QID) | INTRAMUSCULAR | Status: DC | PRN
  Administered 2022-10-02: 4 mg via INTRAVENOUS
  Filled 2022-10-02: qty 2

## 2022-10-02 MED ORDER — LORATADINE 10 MG PO TABS
10.0000 mg | ORAL_TABLET | Freq: Every day | ORAL | Status: DC
  Administered 2022-10-02 – 2022-10-06 (×5): 10 mg via ORAL
  Filled 2022-10-02 (×5): qty 1

## 2022-10-02 MED ORDER — MELATONIN 5 MG PO TABS
30.0000 mg | ORAL_TABLET | Freq: Every evening | ORAL | Status: DC | PRN
  Administered 2022-10-02 – 2022-10-04 (×3): 30 mg via ORAL
  Filled 2022-10-02 (×3): qty 6

## 2022-10-02 NOTE — ED Notes (Signed)
ED TO INPATIENT HANDOFF REPORT  ED Nurse Name and Phone #: Donny Pique, RN [phone]  S Name/Age/Gender [name] 57 y.o. female Room/Bed: 012C/012C  Code Status   Code Status: Full Code  Home/SNF/Other Home Patient oriented to: self, place, time, and situation Is this baseline? Yes   Triage Complete: Triage complete  Chief Complaint Choledocholithiasis [K80.50]  Triage Note Reports RUQ pain that started 2 days ago.  Patient thought it was acid reflux but hasn't gotten better   Allergies Allergies  Allergen Reactions   Bee Venom Anaphylaxis and Rash   Coconut (Cocos Nucifera) Anaphylaxis and Itching   Coconut Fatty Acids Anaphylaxis   Coconut Oil Anaphylaxis and Itching   Food Anaphylaxis and Itching    Walnuts - anaphylaxis, itching   Mixed Ragweed Anaphylaxis, Itching and Rash   Molds & Smuts Anaphylaxis   Mushroom Ext Cmplx(Shiitake-Reishi-Mait) Anaphylaxis   Nutritional Supplements Anaphylaxis and Itching    walnuts   Shellfish Allergy Anaphylaxis   Strawberry Extract Anaphylaxis   Oxycodone Itching   Latex Rash   Norco [Hydrocodone-Acetaminophen] Rash    Level of Care/Admitting Diagnosis ED Disposition     ED Disposition  Admit   Condition  --   Comment  Hospital Area: [facility] [[id number]]  Level of Care: Med-Surg [[id number]]  May admit patient to [facility] or [facility] if equivalent level of care is available:: No  Covid Evaluation: Asymptomatic - no recent exposure (last 10 days) testing not required  Diagnosis: Choledocholithiasis [960454]  Admitting Physician: Zannie Cove [[id number]]  Attending Physician: Zannie Cove [[id number]]  Certification:: I certify this patient will need inpatient services for at least 2 midnights  Estimated Length of Stay: 3          B Medical/Surgery History Past Medical History:  Diagnosis Date   Acute kidney injury (HCC) 10/25/2020   Acute respiratory disease due to COVID-19 virus 10/25/2020   AKI  (acute kidney injury) (HCC) 10/25/2020   Allergic rhinitis    Allergic to cats    pet dander   Allergy    Anemia    Anxiety    Arthritis 09/2018   both feet   Asthma    Back pain    Bipolar disorder (HCC)    Cervicalgia    Chronic fatigue syndrome    Chronic low back pain with left-sided sciatica    Class 3 severe obesity with serious comorbidity and body mass index (BMI) of 40.0 to 44.9 in adult [facility]) 11/14/2014   Constipation    Depression    Diabetes mellitus without complication (HCC)    "pre"   Dyspnea    Environmental allergies    Fibromyalgia    GERD (gastroesophageal reflux disease)    Grave's disease    Heart murmur    High cholesterol    History of blood clots    Hypertension    Joint pain    Lactose intolerance    Lower extremity edema    Multiple food allergies    OSA (obstructive sleep apnea)    Osteoporosis    Palpitations    Panic disorder    Pharyngoesophageal dysphagia 06/24/2014   Prediabetes 05/11/2019   Protein-calorie malnutrition, mild (HCC) 06/23/2021   Pulmonary embolism (HCC) 2024   Rheumatoid arthritis (HCC)    Risk for falls    Sciatica    Severe 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 (HCC)    Sleep apnea    no cpap worn   Subclinical hypothyroidism  05/11/2019   Syncope    Syncope and collapse    Thyroid disease    Vasovagal syncope    Vertigo    Vitamin D deficiency    Past Surgical History:  Procedure Laterality Date   ABDOMINAL HYSTERECTOMY  2006   COLONOSCOPY     GASTRIC BYPASS  2008     A IV Location/Drains/Wounds Patient Lines/Drains/Airways Status     Active Line/Drains/Airways     Name Placement date Placement time Site Days   Peripheral IV 06/01/22 20 G Right Antecubital 06/01/22  1516  Antecubital  123   Peripheral IV 10/02/22 20 G Left Antecubital 10/02/22  0809  Antecubital  less than 1            Intake/Output Last 24 hours No intake or output data in the 24 hours ending  10/02/22 1425  Labs/Imaging Results for orders placed or performed during the hospital encounter of 10/02/22 (from the past 48 hour(s))  Urinalysis, Routine w reflex microscopic -Urine, Clean Catch     Status: Abnormal   Collection Time: 10/02/22  7:37 AM  Result Value Ref Range   Color, Urine AMBER (A) YELLOW    Comment: BIOCHEMICALS MAY BE AFFECTED BY COLOR   APPearance HAZY (A) CLEAR   Specific Gravity, Urine 1.023 1.005 - 1.030   pH 5.0 5.0 - 8.0   Glucose, UA NEGATIVE NEGATIVE mg/dL   Hgb urine dipstick NEGATIVE NEGATIVE   Bilirubin Urine NEGATIVE NEGATIVE   Ketones, ur NEGATIVE NEGATIVE mg/dL   Protein, ur 30 (A) NEGATIVE mg/dL   Nitrite NEGATIVE NEGATIVE   Leukocytes,Ua NEGATIVE NEGATIVE   RBC / HPF 0-5 0 - 5 RBC/hpf   WBC, UA 0-5 0 - 5 WBC/hpf   Bacteria, UA RARE (A) NONE SEEN   Squamous Epithelial / HPF 6-10 0 - 5 /HPF   Mucus PRESENT     Comment: Performed at [facility], [address], [place], [place] [address]  Lipase, blood     Status: None   Collection Time: 10/02/22  7:55 AM  Result Value Ref Range   Lipase 22 11 - 51 U/L    Comment: Performed at [facility], [address], [place], [place] [address]  Comprehensive metabolic panel     Status: Abnormal   Collection Time: 10/02/22  7:55 AM  Result Value Ref Range   Sodium 137 135 - 145 mmol/L   Potassium 3.8 3.5 - 5.1 mmol/L   Chloride 103 98 - 111 mmol/L   CO2 24 22 - 32 mmol/L   Glucose, Bld 93 70 - 99 mg/dL    Comment: Glucose reference range applies only to samples taken after fasting for at least 8 hours.   BUN 23 (H) 6 - 20 mg/dL   Creatinine, Ser 6.96 0.44 - 1.00 mg/dL   Calcium 8.6 (L) 8.9 - 10.3 mg/dL   Total Protein 6.8 6.5 - 8.1 g/dL   Albumin 3.1 (L) 3.5 - 5.0 g/dL   AST 18 15 - 41 U/L   ALT 15 0 - 44 U/L   Alkaline Phosphatase 81 38 - 126 U/L   Total Bilirubin 0.4 0.3 - 1.2 mg/dL   GFR, Estimated >29 >52 mL/min    Comment: (NOTE) Calculated using the CKD-EPI Creatinine  Equation (2021)    Anion gap 10 5 - 15    Comment: Performed at [facility], [address], [place], [place] [address]  CBC     Status: Abnormal  Collection Time: 10/02/22  7:55 AM  Result Value Ref Range   WBC 14.8 (H) 4.0 - 10.5 K/uL   RBC 4.09 3.87 - 5.11 MIL/uL   Hemoglobin 12.0 12.0 - 15.0 g/dL   HCT 24.4 01.0 - 27.2 %   MCV 92.4 80.0 - 100.0 fL   MCH 29.3 26.0 - 34.0 pg   MCHC 31.7 30.0 - 36.0 g/dL   RDW 53.6 64.4 - 03.4 %   Platelets 257 150 - 400 K/uL   nRBC 0.0 0.0 - 0.2 %    Comment: Performed at [facility], [address], [place], [place] [address]   US Abdomen Limited RUQ (LIVER/GB)  Result Date: 10/02/2022 CLINICAL DATA:  [id number] RUQ abdominal pain [id number] EXAM: ULTRASOUND ABDOMEN LIMITED RIGHT UPPER QUADRANT COMPARISON:  CT 05/25/2022 FINDINGS: Gallbladder: Cholelithiasis with multiple intraluminal gallstones, largest measuring up to 0.9 cm. No wall thickening. Negative sonographic Murphy sign. The gallbladder is non-dilated. Common bile duct: Diameter: 8 mm, mildly dilated. Liver: Mildly increased liver echogenicity. No focal liver lesion. Portal vein is patent on color Doppler imaging with normal direction of blood flow towards the liver. Other: None. IMPRESSION: Cholelithiasis with multiple intraluminal gallstones, largest measuring 0.9 cm. No evidence of acute cholecystitis. Mildly dilated common bile duct measuring 8 mm. Correlate with LFTs and consider further evaluation with MRCP. Mild hepatic steatosis. Electronically Signed   By: Caprice Renshaw M.D.   On: 10/02/2022 09:07   DG Chest Portable 1 View  Result Date: 10/02/2022 CLINICAL DATA:  Shortness of breath. EXAM: PORTABLE CHEST 1 VIEW COMPARISON:  06/01/2022 FINDINGS: Mild asymmetric elevation left hemidiaphragm. Interstitial markings are diffusely coarsened with chronic features. Similar streaky opacity at the bases suggest atelectasis. No overt edema or focal lung consolidation. No substantial pleural  effusion. The cardio pericardial silhouette is enlarged. IMPRESSION: Chronic interstitial coarsening with bibasilar atelectasis. No acute cardiopulmonary findings. Electronically Signed   By: Kennith Center M.D.   On: 10/02/2022 08:48    Pending Labs Unresulted Labs (From admission, onward)     Start     Ordered   10/04/22 0500  APTT  Daily,   R      10/02/22 1148   10/04/22 0500  Heparin level (unfractionated)  Daily,   R      10/02/22 1148   10/03/22 0200  APTT  Once-Timed,   TIMED        10/02/22 1148   10/03/22 0200  Heparin level (unfractionated)  Once-Timed,   TIMED        10/02/22 1148   10/02/22 1016  APTT  Add-on,   AD        10/02/22 1015   10/02/22 1016  Heparin level (unfractionated)  Add-on,   AD        10/02/22 1015   Signed and Held  CBC  Tomorrow morning,   R        Signed and Held   Signed and Held  Comprehensive metabolic panel  Tomorrow morning,   R        Signed and Held   Signed and Held  Protime-INR  Tomorrow morning,   R        Signed and Held            Vitals/Pain Today's Vitals   10/02/22 0945 10/02/22 1030 10/02/22 1115 10/02/22 1329  BP: 116/70 104/61 (!) 121/91 102/85  Pulse: 96 95 95 87  Resp: 19 (!) 27 17 18   Temp:  98.6 F (37 C)  TempSrc:      SpO2: 99% 98% 99% 100%  Weight:      Height:      PainSc:        Isolation Precautions No active isolations  Medications Medications  heparin ADULT infusion 100 units/mL (25000 units/259mL) (has no administration in time range)  morphine (PF) 4 MG/ML injection 4 mg (4 mg Intravenous Given 10/02/22 0809)  ondansetron (ZOFRAN) injection 4 mg (4 mg Intravenous Given 10/02/22 0809)  cefTRIAXone (ROCEPHIN) 2 g in sodium chloride 0.9 % 100 mL IVPB (0 g Intravenous Stopped 10/02/22 1241)    Mobility walks     Focused Assessments    R Recommendations: See Admitting Provider Note  Report given to:   Additional Notes: Patient is A&Ox4, ambulates independently. Patient takes eliquis at  home and has heparin to start at 7pm. Plan is for OR, currently on liquid diet.

## 2022-10-02 NOTE — ED Provider Notes (Signed)
[facility] AT [facility] Provider Note   CSN: [id number] Arrival date & time: 10/02/22  4098     History  Chief Complaint  Patient presents with   Abdominal Pain    [name] is a 57 y.o. female with past medical history significant for diabetes, asthma, acid reflux, anxiety, PE who is currently anticoagulated on Eliquis who presents with concern for right flank abdominal pain history 3 days ago.  Patient Dors is some intermittent vomiting.  She reports sometimes worse with eating.  She denies any fever, chills.  She denies any dysuria, hematuria.  She denies any left-sided chest pain.  She does report the right side abdominal pain radiates into the right side of her chest.   Abdominal Pain      Home Medications Prior to Admission medications   Medication Sig Start Date End Date Taking? Authorizing Provider  acetaminophen (TYLENOL) 650 MG CR tablet Take 1,300 mg by mouth as needed for pain.   Yes [provider]  albuterol (VENTOLIN HFA) 108 (90 Base) MCG/ACT inhaler Inhale 2 puffs into the lungs every 6 (six) hours as needed for wheezing. 12/20/19  Yes Whitmire, Thermon Leyland, FNP  apixaban (ELIQUIS) 5 MG TABS tablet Take 2 tablets (10 mg total) by mouth 2 (two) times daily for 4 days, THEN 1 tablet (5 mg total) 2 (two) times daily for 21 days. Patient taking differently: Take 5 mg by mouth once daily  06/04/22 10/02/22 Yes Erick Alley, DO  Ascorbic Acid (VITAMIN C PO) Take 1 tablet by mouth daily.   Yes [provider]  azelastine (ASTELIN) 0.1 % nasal spray Place 2 sprays into both nostrils daily as needed for allergies. Use in each nostril as directed   Yes [provider]  BIOTIN PO Take 1 capsule by mouth daily.   Yes [provider]  botulinum toxin Type A (BOTOX) 200 units injection Provider to inject 155 units into the muscles of the head and neck every 12 weeks. Discard remainder. 07/22/22  Yes Anson Fret, MD  calcium carbonate (TUMS - DOSED IN MG ELEMENTAL CALCIUM) 500 MG chewable tablet Chew 1,000 mg by mouth as needed for indigestion or heartburn.   Yes [provider]  cetirizine (ZYRTEC) 10 MG tablet Take 10 mg by mouth daily. 10/20/21  Yes [provider]  Cyanocobalamin (VITAMIN B-12 PO) Take 1 capsule by mouth daily.   Yes [provider]  diclofenac Sodium (VOLTAREN) 1 % GEL Apply 4 g topically 4 (four) times daily. Patient taking differently: Apply 1 Application topically as needed (pain). 06/04/22  Yes Erick Alley, DO  divalproex (DEPAKOTE ER) 500 MG 24 hr tablet Take 2 tablets (1,000 mg total) by mouth at bedtime AND 1 tablet (500 mg total) daily. Patient taking differently: Take 500 mg by mouth once daily at bedtime  08/19/22  Yes Oletta Darter, MD  EPINEPHrine (EPIPEN 2-PAK) 0.3 mg/0.3 mL IJ SOAJ injection Inject 0.3 mg into the muscle as needed for anaphylaxis.   Yes [provider]  fluticasone (FLONASE) 50 MCG/ACT nasal spray Place 1 spray into both nostrils as needed (allergies/runny nose). 06/04/22  Yes Erick Alley, DO  Galcanezumab-gnlm (EMGALITY) 120 MG/ML SOAJ INJECT 120 MG INTO THE SKIN EVERY 30 DAYS Patient taking differently: Inject 120 mg into the skin every 30 (thirty) days. 07/26/22  Yes Anson Fret, MD  lidocaine (LIDODERM) 5 % Place 1 patch onto the skin daily as needed (pain).  Yes [provider]  Melatonin 10 MG CHEW Chew 30 mg by mouth at bedtime as needed (sleep).   Yes [provider]  montelukast (SINGULAIR) 10 MG tablet Take 10 mg by mouth at bedtime as needed (allergies). 08/07/19  Yes [provider]  Multiple Vitamin (MULTIVITAMIN) tablet Take 1 tablet by mouth in the morning.   Yes [provider]  naloxone (NARCAN) nasal spray 4 mg/0.1 mL Place 0.4 mg into the nose once. 06/19/18  Yes [provider]  NUCYNTA 75 MG tablet Take 75 mg by mouth 2 (two) times daily as needed for  moderate pain or severe pain.   Yes [provider]  Rimegepant Sulfate (NURTEC) 75 MG TBDP Take 1 tablet (75 mg total) by mouth daily. Patient taking differently: Take 75 mg by mouth as needed (migraines). 08/05/22  Yes Anson Fret, MD  tirzepatide [facility]) 7.5 MG/0.5ML Pen Inject 7.5 mg into the skin once a week.   Yes [provider]  doxepin (SINEQUAN) 150 MG capsule Take 150 mg by mouth at bedtime. Patient not taking: Reported on 10/02/2022 06/25/22   [provider]      Allergies    [place] venom, Coconut (cocos nucifera), Coconut fatty acids, Coconut oil, Food, Mixed ragweed, Molds & smuts, Mushroom ext cmplx(shiitake-reishi-mait), Nutritional supplements, Shellfish allergy, Strawberry extract, Oxycodone, Latex, and Norco [hydrocodone-acetaminophen]    Review of Systems   Review of Systems  Gastrointestinal:  Positive for abdominal pain.  All other systems reviewed and are negative.   Physical Exam Updated Vital Signs BP 116/70   Pulse 96   Temp (!) 97.5 F (36.4 C) (Oral)   Resp 19   Ht 5\' 6"  (1.676 m)   Wt 89.8 kg   SpO2 99%   BMI 31.96 kg/m  Physical Exam Vitals and nursing note reviewed.  Constitutional:      General: She is not in acute distress.    Appearance: Normal appearance.  HENT:     Head: Normocephalic and atraumatic.  Eyes:     General:        Right eye: No discharge.        Left eye: No discharge.  Cardiovascular:     Rate and Rhythm: Normal rate and regular rhythm.     Heart sounds: No murmur heard.    No friction rub. No gallop.  Pulmonary:     Effort: Pulmonary effort is normal.     Breath sounds: Normal breath sounds.  Abdominal:     General: Bowel sounds are normal.     Palpations: Abdomen is soft.     Comments: Focal right upper quadrant tenderness, positive Murphy sign on my exam, no rebound, training, guarding  Skin:    General: Skin is warm and dry.     Capillary Refill: Capillary refill takes less than 2  seconds.  Neurological:     Mental Status: She is alert and oriented to person, place, and time.  Psychiatric:        Mood and Affect: Mood normal.        Behavior: Behavior normal.     ED Results / Procedures / Treatments   Labs (all labs ordered are listed, but only abnormal results are displayed) Labs Reviewed  COMPREHENSIVE METABOLIC PANEL - Abnormal; Notable for the following components:      Result Value   BUN 23 (*)    Calcium 8.6 (*)    Albumin 3.1 (*)    All other components within  normal limits  CBC - Abnormal; Notable for the following components:   WBC 14.8 (*)    All other components within normal limits  URINALYSIS, ROUTINE W REFLEX MICROSCOPIC - Abnormal; Notable for the following components:   Color, Urine AMBER (*)    APPearance HAZY (*)    Protein, ur 30 (*)    Bacteria, UA RARE (*)    All other components within normal limits  LIPASE, BLOOD  APTT  HEPARIN LEVEL (UNFRACTIONATED)    EKG EKG Interpretation  Date/Time:  Saturday Oct 02 2022 08:52:03 EDT Ventricular Rate:  91 PR Interval:  178 QRS Duration: 90 QT Interval:  334 QTC Calculation: 411 R Axis:   -24 Text Interpretation: Sinus rhythm Abnormal R-wave progression, early transition Left ventricular hypertrophy Anterior Q waves, possibly due to LVH No significant change since last tracing Confirmed by Melene Plan [phone]) on 10/02/2022 8:54:06 AM  Radiology US Abdomen Limited RUQ (LIVER/GB)  Result Date: 10/02/2022 CLINICAL DATA:  [id number] RUQ abdominal pain [id number] EXAM: ULTRASOUND ABDOMEN LIMITED RIGHT UPPER QUADRANT COMPARISON:  CT 05/25/2022 FINDINGS: Gallbladder: Cholelithiasis with multiple intraluminal gallstones, largest measuring up to 0.9 cm. No wall thickening. Negative sonographic Murphy sign. The gallbladder is non-dilated. Common bile duct: Diameter: 8 mm, mildly dilated. Liver: Mildly increased liver echogenicity. No focal liver lesion. Portal vein is patent on color Doppler imaging with  normal direction of blood flow towards the liver. Other: None. IMPRESSION: Cholelithiasis with multiple intraluminal gallstones, largest measuring 0.9 cm. No evidence of acute cholecystitis. Mildly dilated common bile duct measuring 8 mm. Correlate with LFTs and consider further evaluation with MRCP. Mild hepatic steatosis. Electronically Signed   By: Caprice Renshaw M.D.   On: 10/02/2022 09:07   DG Chest Portable 1 View  Result Date: 10/02/2022 CLINICAL DATA:  Shortness of breath. EXAM: PORTABLE CHEST 1 VIEW COMPARISON:  06/01/2022 FINDINGS: Mild asymmetric elevation left hemidiaphragm. Interstitial markings are diffusely coarsened with chronic features. Similar streaky opacity at the bases suggest atelectasis. No overt edema or focal lung consolidation. No substantial pleural effusion. The cardio pericardial silhouette is enlarged. IMPRESSION: Chronic interstitial coarsening with bibasilar atelectasis. No acute cardiopulmonary findings. Electronically Signed   By: Kennith Center M.D.   On: 10/02/2022 08:48    Procedures Procedures    Medications Ordered in ED Medications  morphine (PF) 4 MG/ML injection 4 mg (4 mg Intravenous Given 10/02/22 0809)  ondansetron (ZOFRAN) injection 4 mg (4 mg Intravenous Given 10/02/22 0809)  cefTRIAXone (ROCEPHIN) 2 g in sodium chloride 0.9 % 100 mL IVPB (2 g Intravenous New Bag/Given 10/02/22 1012)    ED Course/ Medical Decision Making/ A&P Clinical Course as of 10/02/22 1117  Sat Oct 02, 2022  0941 Transition to heparin from eliquis, begin abx, admit to medicine [CP]    Clinical Course User Index [CP] Olene Floss, PA-C                             Medical Decision Making Amount and/or Complexity of Data Reviewed Labs: ordered. Radiology: ordered.  Risk Prescription drug management.   This patient is a 57 y.o. female  who presents to the ED for concern of ruq abdominal pain, nausea, diarrhea.   Differential diagnoses prior to evaluation: The  emergent differential diagnosis includes, but is not limited to,  esophagitis, gastritis, peptic ulcer disease, esophageal rupture, gastric rupture, Boerhaave's, Mallory-Weiss, pancreatitis, cholecystitis, cholangitis, acute mesenteric ischemia, atypical chest pain or ACS, lower lobar pneumonia  versus other . This is not an exhaustive differential.   Past Medical History / Co-morbidities: Diabetes, asthma, acid reflux, anxiety, bipolar, pulmonary embolism who is currently on anticoagulation  Additional history: Chart reviewed. Pertinent results include: Reviewed lab work, imaging from patient's previous emergency department visits and hospital admissions, outpatient cardiology and pulmonology notes.  Physical Exam: Physical exam performed. The pertinent findings include: Focal right upper quadrant tenderness, positive Murphy sign on my exam.  Her vital signs are overall stable, she is mildly tachycardic on arrival, pulse rate 101, with normal rhythm.  Lab Tests/Imaging studies: I personally interpreted labs/imaging and the pertinent results include: Patient does have a significant leukocytosis, white blood cells 14.8, may be indicative of an early developing infection.  Her UA is unremarkable, CMP notable for mildly elevated BUN at 23.  LFTs normal range.  Independently interpreted right upper quadrant ultrasound shows CBD dilation at 8 mm, gallstones and sludge without acute cholecystitis noted.  I agree with the radiologist interpretation.  Cardiac monitoring: EKG obtained and interpreted by my attending physician which shows: NSR, no significant change since last tracing   Medications: I ordered medication including morphine, rocephin due to concern for developing acute cholecystitis.  I have reviewed the patients home medicines and have made adjustments as needed.   Consults: Spoke with surgery who recommends hospital admission with surgical consult for likely removal within coming days.  Spoke with hospitalist, Dr. Maisie Fus who agrees to admission at this time.  Disposition: After consideration of the diagnostic results and the patients response to treatment, I feel that  .    Final Clinical Impression(s) / ED Diagnoses Final diagnoses:  None    Rx / DC Orders ED Discharge Orders     None         Olene Floss, PA-C 10/02/22 1117    Melene Plan, DO 10/02/22 1231

## 2022-10-02 NOTE — ED Triage Notes (Signed)
Reports RUQ pain that started 2 days ago.  Patient thought it was acid reflux but hasn't gotten better

## 2022-10-02 NOTE — Progress Notes (Signed)
ANTICOAGULATION CONSULT NOTE - Initial Consult  Pharmacy Consult for Heparin Indication:  Hx of PE with plan for surgery for symptomatic cholelithiasis likely during admission (hold eliquis with heparin bridge)  Allergies  Allergen Reactions   [place] Venom Anaphylaxis and Rash   Coconut (Cocos Nucifera) Anaphylaxis and Itching   Coconut Fatty Acids Anaphylaxis   Food Anaphylaxis and Itching    Walnuts - anaphylaxis, itching   Mixed Ragweed Anaphylaxis, Itching and Rash   Molds & Smuts Anaphylaxis   Mushroom Ext Cmplx(Shiitake-Reishi-Mait) Anaphylaxis   Shellfish Allergy Anaphylaxis   Strawberry Extract Anaphylaxis   Latex Rash   Norco [Hydrocodone-Acetaminophen] Rash    Patient Measurements: Height: 5\' 6"  (167.6 cm) Weight: 89.8 kg (198 lb) IBW/kg (Calculated) : 59.3 Heparin Dosing Weight: 78.8 kg  Vital Signs: Temp: 97.5 F (36.4 C) (05/11 0748) Temp Source: Oral (05/11 0748) BP: 116/70 (05/11 0945) Pulse Rate: 96 (05/11 0945)  Labs: Recent Labs    10/02/22 0755  HGB 12.0  HCT 37.8  PLT 257  CREATININE 1.00    Estimated Creatinine Clearance: 68.4 mL/min (by C-G formula based on SCr of 1 mg/dL).   Medical History: Past Medical History:  Diagnosis Date   Acute kidney injury (HCC) 10/25/2020   Acute respiratory disease due to COVID-19 virus 10/25/2020   AKI (acute kidney injury) (HCC) 10/25/2020   Allergic rhinitis    Allergic to cats    pet dander   Allergy    Anemia    Anxiety    Arthritis 09/2018   both feet   Asthma    Back pain    Bipolar disorder (HCC)    Cervicalgia    Chronic fatigue syndrome    Chronic low back pain with left-sided sciatica    Class 3 severe obesity with serious comorbidity and body mass index (BMI) of 40.0 to 44.9 in adult [facility]) 11/14/2014   Constipation    Depression    Diabetes mellitus without complication (HCC)    "pre"   Dyspnea    Environmental allergies    Fibromyalgia    GERD (gastroesophageal reflux disease)     Grave's disease    Heart murmur    High cholesterol    History of blood clots    Hypertension    Joint pain    Lactose intolerance    Lower extremity edema    Multiple food allergies    OSA (obstructive sleep apnea)    Osteoporosis    Palpitations    Panic disorder    Pharyngoesophageal dysphagia 06/24/2014   Prediabetes 05/11/2019   Protein-calorie malnutrition, mild (HCC) 06/23/2021   Pulmonary embolism (HCC) 2024   Rheumatoid arthritis (HCC)    Risk for falls    Sciatica    Severe 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 (HCC)    Sleep apnea    no cpap worn   Subclinical hypothyroidism 05/11/2019   Syncope    Syncope and collapse    Thyroid disease    Vasovagal syncope    Vertigo    Vitamin D deficiency     Medications:  (Not in a hospital admission)  Scheduled:  Infusions:   cefTRIAXone (ROCEPHIN)  IV     PRN:   Assessment: 70 yof with a history of DM, asthma, acid reflux, anxiety, PE on eliquis. Patient is presenting with rt flank pain. Heparin per pharmacy consult placed for  Hx of PE with plan for surgery for symptomatic cholelithiasis likely during admission (hold eliquis with heparin bridge) .  Patient is on apixaban prior to arrival. Last dose 5/11 ~0730 per patient. Will require aPTT monitoring due to likely falsely high anti-Xa level secondary to DOAC use.  Hgb 12; plt 257  Goal of Therapy:  Heparin level 0.3-0.7 units/ml aPTT 66-102 seconds Monitor platelets by anticoagulation protocol: Yes   Plan:  No initial heparin bolus Start heparin infusion at 1400 units/hr at 7pm today Check aPTT & anti-Xa level at 0200 and daily while on heparin Continue to monitor via aPTT until levels are correlated Continue to monitor H&H and platelets  Delmar Landau, PharmD, BCPS 10/02/2022 9:53 AM ED Clinical Pharmacist -  [phone]

## 2022-10-02 NOTE — H&P (Signed)
[facility] History and Physical  [name]:[id number] DOB: 09/24/1965 DOA: 10/02/2022  Referring physician: EDP PCP: Iona Hansen, NP   Chief Complaint: Abdominal pain  HPI: [name] is a 57 y.o. female with history of migraines, asthma, seasonal allergies, history of PE likely secondary to hormonal therapy in 1/24 on Eliquis presented to the ED with right upper quadrant abdominal pain nausea and an episode of vomiting, symptoms started 2 to 3 days ago. In the ED afebrile, vital signs stable, labs noted unremarkable electrolytes, WBC of 14.8, right upper quadrant ultrasound noted cholelithiasis and mild common bile duct dilation.   Review of Systems: As above otherwise 14 point review of systems is negative  Past Medical History:  Diagnosis Date   Acute kidney injury (HCC) 10/25/2020   Acute respiratory disease due to COVID-19 virus 10/25/2020   AKI (acute kidney injury) (HCC) 10/25/2020   Allergic rhinitis    Allergic to cats    pet dander   Allergy    Anemia    Anxiety    Arthritis 09/2018   both feet   Asthma    Back pain    Bipolar disorder (HCC)    Cervicalgia    Chronic fatigue syndrome    Chronic low back pain with left-sided sciatica    Class 3 severe obesity with serious comorbidity and body mass index (BMI) of 40.0 to 44.9 in adult [facility]) 11/14/2014   Constipation    Depression    Diabetes mellitus without complication (HCC)    "pre"   Dyspnea    Environmental allergies    Fibromyalgia    GERD (gastroesophageal reflux disease)    Grave's disease    Heart murmur    High cholesterol    History of blood clots    Hypertension    Joint pain    Lactose intolerance    Lower extremity edema    Multiple food allergies    OSA (obstructive sleep apnea)    Osteoporosis    Palpitations    Panic disorder    Pharyngoesophageal dysphagia 06/24/2014   Prediabetes 05/11/2019   Protein-calorie malnutrition, mild (HCC) 06/23/2021   Pulmonary  embolism (HCC) 2024   Rheumatoid arthritis (HCC)    Risk for falls    Sciatica    Severe 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 (HCC)    Sleep apnea    no cpap worn   Subclinical hypothyroidism 05/11/2019   Syncope    Syncope and collapse    Thyroid disease    Vasovagal syncope    Vertigo    Vitamin D deficiency    Past Surgical History:  Procedure Laterality Date   ABDOMINAL HYSTERECTOMY  2006   COLONOSCOPY     GASTRIC BYPASS  2008   Social History:  reports that she has never smoked. She has never used smokeless tobacco. She reports that she does not currently use alcohol. She reports that she does not use drugs.  Allergies  Allergen Reactions   Bee Venom Anaphylaxis and Rash   Coconut (Cocos Nucifera) Anaphylaxis and Itching   Coconut Fatty Acids Anaphylaxis   Coconut Oil Anaphylaxis and Itching   Food Anaphylaxis and Itching    Walnuts - anaphylaxis, itching   Mixed Ragweed Anaphylaxis, Itching and Rash   Molds & Smuts Anaphylaxis   Mushroom Ext Cmplx(Shiitake-Reishi-Mait) Anaphylaxis   Nutritional Supplements Anaphylaxis and Itching    walnuts   Shellfish Allergy Anaphylaxis   Strawberry Extract Anaphylaxis   Oxycodone Itching  Latex Rash   Norco [Hydrocodone-Acetaminophen] Rash    Family History  Problem Relation Age of Onset   Hypertension Mother    Cirrhosis Mother    Alcohol abuse Mother    Depression Mother    Physical abuse Mother    Hyperlipidemia Mother    Thyroid disease Mother    Anxiety disorder Mother    Arthritis Mother    Hypertension Father    Alcohol abuse Father    Hyperlipidemia Father    Depression Father    Drug abuse Father    Arthritis Father    COPD Father    Gout Father    Hypertension Sister    Alcohol abuse Sister    Drug abuse Sister    Depression Sister    Anxiety disorder Sister    OCD Sister    Thyroid disease Sister    Depression Sister    Thyroid disease Sister    Stroke Maternal  Grandmother    Heart attack Maternal Grandmother    Diabetes Maternal Uncle    Stroke Maternal Uncle    Colon cancer Maternal Uncle    Seizures Cousin    Breast cancer Cousin    Breast cancer Cousin    ADD / ADHD Other    Diabetes Other    Hypertension Other    Dementia Neg Hx    Esophageal cancer Neg Hx    Rectal cancer Neg Hx    Stomach cancer Neg Hx     Prior to Admission medications   Medication Sig Start Date End Date Taking? Authorizing Provider  acetaminophen (TYLENOL) 650 MG CR tablet Take 1,300 mg by mouth as needed for pain.   Yes [provider]  albuterol (VENTOLIN HFA) 108 (90 Base) MCG/ACT inhaler Inhale 2 puffs into the lungs every 6 (six) hours as needed for wheezing. 12/20/19  Yes Whitmire, Thermon Leyland, FNP  apixaban (ELIQUIS) 5 MG TABS tablet Take 2 tablets (10 mg total) by mouth 2 (two) times daily for 4 days, THEN 1 tablet (5 mg total) 2 (two) times daily for 21 days. Patient taking differently: Take 5 mg by mouth once daily  06/04/22 10/02/22 Yes Erick Alley, DO  Ascorbic Acid (VITAMIN C PO) Take 1 tablet by mouth daily.   Yes [provider]  azelastine (ASTELIN) 0.1 % nasal spray Place 2 sprays into both nostrils daily as needed for allergies. Use in each nostril as directed   Yes [provider]  BIOTIN PO Take 1 capsule by mouth daily.   Yes [provider]  botulinum toxin Type A (BOTOX) 200 units injection Provider to inject 155 units into the muscles of the head and neck every 12 weeks. Discard remainder. 07/22/22  Yes Anson Fret, MD  calcium carbonate (TUMS - DOSED IN MG ELEMENTAL CALCIUM) 500 MG chewable tablet Chew 1,000 mg by mouth as needed for indigestion or heartburn.   Yes [provider]  cetirizine (ZYRTEC) 10 MG tablet Take 10 mg by mouth daily. 10/20/21  Yes [provider]  Cyanocobalamin (VITAMIN B-12 PO) Take 1 capsule by mouth daily.   Yes [provider]  diclofenac Sodium (VOLTAREN)  1 % GEL Apply 4 g topically 4 (four) times daily. Patient taking differently: Apply 1 Application topically as needed (pain). 06/04/22  Yes Erick Alley, DO  divalproex (DEPAKOTE ER) 500 MG 24 hr tablet Take 2 tablets (1,000 mg total) by mouth at bedtime AND 1 tablet (500 mg total) daily. Patient taking differently: Take  500 mg by mouth once daily at bedtime  08/19/22  Yes Oletta Darter, MD  EPINEPHrine (EPIPEN 2-PAK) 0.3 mg/0.3 mL IJ SOAJ injection Inject 0.3 mg into the muscle as needed for anaphylaxis.   Yes [provider]  fluticasone (FLONASE) 50 MCG/ACT nasal spray Place 1 spray into both nostrils as needed (allergies/runny nose). 06/04/22  Yes Erick Alley, DO  Galcanezumab-gnlm (EMGALITY) 120 MG/ML SOAJ INJECT 120 MG INTO THE SKIN EVERY 30 DAYS Patient taking differently: Inject 120 mg into the skin every 30 (thirty) days. 07/26/22  Yes Anson Fret, MD  lidocaine (LIDODERM) 5 % Place 1 patch onto the skin daily as needed (pain).   Yes [provider]  Melatonin 10 MG CHEW Chew 30 mg by mouth at bedtime as needed (sleep).   Yes [provider]  montelukast (SINGULAIR) 10 MG tablet Take 10 mg by mouth at bedtime as needed (allergies). 08/07/19  Yes [provider]  Multiple Vitamin (MULTIVITAMIN) tablet Take 1 tablet by mouth in the morning.   Yes [provider]  naloxone (NARCAN) nasal spray 4 mg/0.1 mL Place 0.4 mg into the nose once. 06/19/18  Yes [provider]  NUCYNTA 75 MG tablet Take 75 mg by mouth 2 (two) times daily as needed for moderate pain or severe pain.   Yes [provider]  Rimegepant Sulfate (NURTEC) 75 MG TBDP Take 1 tablet (75 mg total) by mouth daily. Patient taking differently: Take 75 mg by mouth as needed (migraines). 08/05/22  Yes Anson Fret, MD  tirzepatide [facility]) 7.5 MG/0.5ML Pen Inject 7.5 mg into the skin once a week.   Yes [provider]  doxepin (SINEQUAN) 150 MG capsule Take  150 mg by mouth at bedtime. Patient not taking: Reported on 10/02/2022 06/25/22   [provider]   Physical Exam: Vitals:   10/02/22 0945 10/02/22 1030 10/02/22 1115 10/02/22 1329  BP: 116/70 104/61 (!) 121/91 102/85  Pulse: 96 95 95 87  Resp: 19 (!) 27 17 18   Temp:    98.6 F (37 C)  TempSrc:      SpO2: 99% 98% 99% 100%  Weight:      Height:        Wt Readings from Last 3 Encounters:  10/02/22 89.8 kg  06/04/22 89.8 kg  05/25/22 94.8 kg    General:  Appears calm and comfortable Eyes: PERRL, normal lids, irises & conjunctiva ENT: grossly normal hearing, lips & tongue Neck: no LAD, masses or thyromegaly Cardiovascular: S1-2, regular rhythm Respiratory: CTA bilaterally, no w/r/r. Normal respiratory effort. Abdomen: Soft, mild right-sided tenderness, no rigidity or rebound, bowel sounds present Skin: No rashes on exposed skin Neurologic: grossly non-focal.          Labs on Admission:  Basic Metabolic Panel: Recent Labs  Lab 10/02/22 0755  NA 137  K 3.8  CL 103  CO2 24  GLUCOSE 93  BUN 23*  CREATININE 1.00  CALCIUM 8.6*   Liver Function Tests: Recent Labs  Lab 10/02/22 0755  AST 18  ALT 15  ALKPHOS 81  BILITOT 0.4  PROT 6.8  ALBUMIN 3.1*   Recent Labs  Lab 10/02/22 0755  LIPASE 22   No results for input(s): "AMMONIA" in the last 168 hours. CBC: Recent Labs  Lab 10/02/22 0755  WBC 14.8*  HGB 12.0  HCT 37.8  MCV 92.4  PLT 257   Cardiac Enzymes: No results for input(s): "CKTOTAL", "CKMB", "CKMBINDEX", "TROPONINI" in the last 168 hours.  BNP (last 3 results) Recent Labs    06/01/22 1356  BNP 12.1    ProBNP (last 3 results) No results for input(s): "PROBNP" in the last 8760 hours.  CBG: No results for input(s): "GLUCAP" in the last 168 hours.  Radiological Exams on Admission: US Abdomen Limited RUQ (LIVER/GB)  Result Date: 10/02/2022 CLINICAL DATA:  151470 RUQ abdominal pain [id number] EXAM: ULTRASOUND ABDOMEN LIMITED RIGHT  UPPER QUADRANT COMPARISON:  CT 05/25/2022 FINDINGS: Gallbladder: Cholelithiasis with multiple intraluminal gallstones, largest measuring up to 0.9 cm. No wall thickening. Negative sonographic Murphy sign. The gallbladder is non-dilated. Common bile duct: Diameter: 8 mm, mildly dilated. Liver: Mildly increased liver echogenicity. No focal liver lesion. Portal vein is patent on color Doppler imaging with normal direction of blood flow towards the liver. Other: None. IMPRESSION: Cholelithiasis with multiple intraluminal gallstones, largest measuring 0.9 cm. No evidence of acute cholecystitis. Mildly dilated common bile duct measuring 8 mm. Correlate with LFTs and consider further evaluation with MRCP. Mild hepatic steatosis. Electronically Signed   By: Caprice Renshaw M.D.   On: 10/02/2022 09:07   DG Chest Portable 1 View  Result Date: 10/02/2022 CLINICAL DATA:  Shortness of breath. EXAM: PORTABLE CHEST 1 VIEW COMPARISON:  06/01/2022 FINDINGS: Mild asymmetric elevation left hemidiaphragm. Interstitial markings are diffusely coarsened with chronic features. Similar streaky opacity at the bases suggest atelectasis. No overt edema or focal lung consolidation. No substantial pleural effusion. The cardio pericardial silhouette is enlarged. IMPRESSION: Chronic interstitial coarsening with bibasilar atelectasis. No acute cardiopulmonary findings. Electronically Signed   By: Kennith Center M.D.   On: 10/02/2022 08:48     Assessment/Plan  Symptomatic cholelithiasis -General surgery consulting, will hold Eliquis, start IV heparin -Await surgical input regarding possible lap chole -Trend CBC, LFTs in a.m.  History of PE -Diagnosed 1/24 in the setting of hormonal therapy, holding Eliquis, continue IV heparin for now  History of migraines -Home meds Nucynta and Nurtec resumed -Resumed nightly Depakote  Seasonal allergies -Claritin  DVT prophylaxis: IV heparin CODE STATUS full code Family communication: No  family at bedside Disposition: Admit to inpatient Consultants General surgery Dr. [name] Person   Time spent:  [place]annie [place] [facility]

## 2022-10-02 NOTE — ED Notes (Signed)
Attempted to notify legal guardian.

## 2022-10-02 NOTE — Consult Note (Signed)
Reason for Consult/Chief Complaint: symptomatic cholelithiasis Consultant: Montel Clock, PA  [name] is an 57 y.o. female.   HPI: [name] with RUQ abdominal pain, nausea, and vomiting since 5/7. Thought her symptoms were related to a long car ride. Takes Eliquis, started in January, for PE. Last dose this AM. Does not see cardiology regularly, but did wear a Holter for 30d after her hospitalization in January. Last c-scope 2y ago.   Past Medical History:  Diagnosis Date   Acute kidney injury (HCC) 10/25/2020   Acute respiratory disease due to COVID-19 virus 10/25/2020   AKI (acute kidney injury) (HCC) 10/25/2020   Allergic rhinitis    Allergic to cats    pet dander   Allergy    Anemia    Anxiety    Arthritis 09/2018   both feet   Asthma    Back pain    Bipolar disorder (HCC)    Cervicalgia    Chronic fatigue syndrome    Chronic low back pain with left-sided sciatica    Class 3 severe obesity with serious comorbidity and body mass index (BMI) of 40.0 to 44.9 in adult [facility]) 11/14/2014   Constipation    Depression    Diabetes mellitus without complication (HCC)    "pre"   Dyspnea    Environmental allergies    Fibromyalgia    GERD (gastroesophageal reflux disease)    Grave's disease    Heart murmur    High cholesterol    History of blood clots    Hypertension    Joint pain    Lactose intolerance    Lower extremity edema    Multiple food allergies    OSA (obstructive sleep apnea)    Osteoporosis    Palpitations    Panic disorder    Pharyngoesophageal dysphagia 06/24/2014   Prediabetes 05/11/2019   Protein-calorie malnutrition, mild (HCC) 06/23/2021   Pulmonary embolism (HCC) 2024   Rheumatoid arthritis (HCC)    Risk for falls    Sciatica    Severe 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 (HCC)    Sleep apnea    no cpap worn   Subclinical hypothyroidism 05/11/2019   Syncope    Syncope and collapse    Thyroid disease    Vasovagal syncope     Vertigo    Vitamin D deficiency     Past Surgical History:  Procedure Laterality Date   ABDOMINAL HYSTERECTOMY  2006   COLONOSCOPY     GASTRIC BYPASS  2008    Family History  Problem Relation Age of Onset   Hypertension Mother    Cirrhosis Mother    Alcohol abuse Mother    Depression Mother    Physical abuse Mother    Hyperlipidemia Mother    Thyroid disease Mother    Anxiety disorder Mother    Arthritis Mother    Hypertension Father    Alcohol abuse Father    Hyperlipidemia Father    Depression Father    Drug abuse Father    Arthritis Father    COPD Father    Gout Father    Hypertension Sister    Alcohol abuse Sister    Drug abuse Sister    Depression Sister    Anxiety disorder Sister    OCD Sister    Thyroid disease Sister    Depression Sister    Thyroid disease Sister    Stroke Maternal Grandmother    Heart attack Maternal Grandmother    Diabetes Maternal Uncle  Stroke Maternal Uncle    Colon cancer Maternal Uncle    Seizures Cousin    Breast cancer Cousin    Breast cancer Cousin    ADD / ADHD Other    Diabetes Other    Hypertension Other    Dementia Neg Hx    Esophageal cancer Neg Hx    Rectal cancer Neg Hx    Stomach cancer Neg Hx     Social History:  reports that she has never smoked. She has never used smokeless tobacco. She reports that she does not currently use alcohol. She reports that she does not use drugs.  Allergies:  Allergies  Allergen Reactions   Bee Venom Anaphylaxis and Rash   Coconut (Cocos Nucifera) Anaphylaxis and Itching   Coconut Fatty Acids Anaphylaxis   Food Anaphylaxis and Itching    Walnuts - anaphylaxis, itching   Mixed Ragweed Anaphylaxis, Itching and Rash   Molds & Smuts Anaphylaxis   Mushroom Ext Cmplx(Shiitake-Reishi-Mait) Anaphylaxis   Shellfish Allergy Anaphylaxis   Strawberry Extract Anaphylaxis   Latex Rash   Norco [Hydrocodone-Acetaminophen] Rash    Medications: I have reviewed the patient's  current medications.  Results for orders placed or performed during the hospital encounter of 10/02/22 (from the past 48 hour(s))  Urinalysis, Routine w reflex microscopic -Urine, Clean Catch     Status: Abnormal   Collection Time: 10/02/22  7:37 AM  Result Value Ref Range   Color, Urine AMBER (A) YELLOW    Comment: BIOCHEMICALS MAY BE AFFECTED BY COLOR   APPearance HAZY (A) CLEAR   Specific Gravity, Urine 1.023 1.005 - 1.030   pH 5.0 5.0 - 8.0   Glucose, UA NEGATIVE NEGATIVE mg/dL   Hgb urine dipstick NEGATIVE NEGATIVE   Bilirubin Urine NEGATIVE NEGATIVE   Ketones, ur NEGATIVE NEGATIVE mg/dL   Protein, ur 30 (A) NEGATIVE mg/dL   Nitrite NEGATIVE NEGATIVE   Leukocytes,Ua NEGATIVE NEGATIVE   RBC / HPF 0-5 0 - 5 RBC/hpf   WBC, UA 0-5 0 - 5 WBC/hpf   Bacteria, UA RARE (A) NONE SEEN   Squamous Epithelial / HPF 6-10 0 - 5 /HPF   Mucus PRESENT     Comment: Performed at [facility], [address] [place], [place] [address]  Lipase, blood     Status: None   Collection Time: 10/02/22  7:55 AM  Result Value Ref Range   Lipase 22 11 - 51 U/L    Comment: Performed at [facility], [address], [place], [place] [address]  Comprehensive metabolic panel     Status: Abnormal   Collection Time: 10/02/22  7:55 AM  Result Value Ref Range   Sodium 137 135 - 145 mmol/L   Potassium 3.8 3.5 - 5.1 mmol/L   Chloride 103 98 - 111 mmol/L   CO2 24 22 - 32 mmol/L   Glucose, Bld 93 70 - 99 mg/dL    Comment: Glucose reference range applies only to samples taken after fasting for at least 8 hours.   BUN 23 (H) 6 - 20 mg/dL   Creatinine, Ser 8.46 0.44 - 1.00 mg/dL   Calcium 8.6 (L) 8.9 - 10.3 mg/dL   Total Protein 6.8 6.5 - 8.1 g/dL   Albumin 3.1 (L) 3.5 - 5.0 g/dL   AST 18 15 - 41 U/L   ALT 15 0 - 44 U/L   Alkaline Phosphatase 81 38 - 126 U/L   Total Bilirubin 0.4 0.3 - 1.2 mg/dL  GFR, Estimated >60 >60 mL/min    Comment: (NOTE) Calculated using the CKD-EPI Creatinine Equation  (2021)    Anion gap 10 5 - 15    Comment: Performed at [facility], [address], [place], [place] [address]  CBC     Status: Abnormal   Collection Time: 10/02/22  7:55 AM  Result Value Ref Range   WBC 14.8 (H) 4.0 - 10.5 K/uL   RBC 4.09 3.87 - 5.11 MIL/uL   Hemoglobin 12.0 12.0 - 15.0 g/dL   HCT 30.8 65.7 - 84.6 %   MCV 92.4 80.0 - 100.0 fL   MCH 29.3 26.0 - 34.0 pg   MCHC 31.7 30.0 - 36.0 g/dL   RDW 96.2 95.2 - 84.1 %   Platelets 257 150 - 400 K/uL   nRBC 0.0 0.0 - 0.2 %    Comment: Performed at [facility], [address] [place], [place] [address]    US Abdomen Limited RUQ (LIVER/GB)  Result Date: 10/02/2022 CLINICAL DATA:  [id number] RUQ abdominal pain [id number] EXAM: ULTRASOUND ABDOMEN LIMITED RIGHT UPPER QUADRANT COMPARISON:  CT 05/25/2022 FINDINGS: Gallbladder: Cholelithiasis with multiple intraluminal gallstones, largest measuring up to 0.9 cm. No wall thickening. Negative sonographic Murphy sign. The gallbladder is non-dilated. Common bile duct: Diameter: 8 mm, mildly dilated. Liver: Mildly increased liver echogenicity. No focal liver lesion. Portal vein is patent on color Doppler imaging with normal direction of blood flow towards the liver. Other: None. IMPRESSION: Cholelithiasis with multiple intraluminal gallstones, largest measuring 0.9 cm. No evidence of acute cholecystitis. Mildly dilated common bile duct measuring 8 mm. Correlate with LFTs and consider further evaluation with MRCP. Mild hepatic steatosis. Electronically Signed   By: Caprice Renshaw M.D.   On: 10/02/2022 09:07   DG Chest Portable 1 View  Result Date: 10/02/2022 CLINICAL DATA:  Shortness of breath. EXAM: PORTABLE CHEST 1 VIEW COMPARISON:  06/01/2022 FINDINGS: Mild asymmetric elevation left hemidiaphragm. Interstitial markings are diffusely coarsened with chronic features. Similar streaky opacity at the bases suggest atelectasis. No overt edema or focal lung consolidation. No substantial pleural  effusion. The cardio pericardial silhouette is enlarged. IMPRESSION: Chronic interstitial coarsening with bibasilar atelectasis. No acute cardiopulmonary findings. Electronically Signed   By: Kennith Center M.D.   On: 10/02/2022 08:48    ROS 10 point review of systems is negative except as listed above in HPI.   Physical Exam Blood pressure 116/70, pulse 96, temperature (!) 97.5 F (36.4 C), temperature source Oral, resp. rate 19, height 5\' 6"  (1.676 m), weight 89.8 kg, SpO2 99 %. Constitutional: well-developed, well-nourished HEENT: pupils equal, round, reactive to light, 2mm b/l, moist conjunctiva, external inspection of ears and nose normal, hearing intact Oropharynx: normal oropharyngeal mucosa, normal dentition Neck: no thyromegaly, trachea midline, no midline cervical tenderness to palpation Chest: breath sounds equal bilaterally, normal respiratory effort, no midline or lateral chest wall tenderness to palpation/deformity Abdomen: soft, NT, no bruising, no hepatosplenomegaly GU: normal female genitalia  Back: no wounds, no thoracic/lumbar spine tenderness to palpation, no thoracic/lumbar spine stepoffs Rectal: deferred Extremities: 2+ radial and pedal pulses bilaterally, intact motor and sensation bilateral UE and LE, no peripheral edema MSK: unable to assess gait/station, no clubbing/cyanosis of fingers/toes, normal ROM of all four extremities Skin: warm, dry, no rashes Psych: normal memory, normal mood/affect     Assessment/Plan: 2F with acute cholecystitis vs symptomatic cholelithiasis. Recommend lap chole. CBD 8mm, but LFTs normal, consider IOC at the time of surgery pending LFTs. On Eliquis,  last dose this AM, recommend delaying surgery until this wears off. Okay for heparin gtt in the interim. Can consider repeat CTA chest to evaluate clot burden, but this is not necessary or recommended from my standpoint given low burden noted on CTA in January.    Diamantina Monks,  MD General and Trauma Surgery [facility]

## 2022-10-03 DIAGNOSIS — K8000 Calculus of gallbladder with acute cholecystitis without obstruction: Secondary | ICD-10-CM | POA: Diagnosis not present

## 2022-10-03 LAB — COMPREHENSIVE METABOLIC PANEL
ALT: 12 U/L (ref 0–44)
AST: 17 U/L (ref 15–41)
Albumin: 2.3 g/dL — ABNORMAL LOW (ref 3.5–5.0)
Alkaline Phosphatase: 62 U/L (ref 38–126)
Anion gap: 9 (ref 5–15)
BUN: 18 mg/dL (ref 6–20)
CO2: 25 mmol/L (ref 22–32)
Calcium: 7.9 mg/dL — ABNORMAL LOW (ref 8.9–10.3)
Chloride: 104 mmol/L (ref 98–111)
Creatinine, Ser: 0.81 mg/dL (ref 0.44–1.00)
GFR, Estimated: >60 mL/min (ref >60)
Glucose, Bld: 93 mg/dL (ref 70–99)
Potassium: 3.5 mmol/L (ref 3.5–5.1)
Sodium: 138 mmol/L (ref 135–145)
Total Bilirubin: 0.1 mg/dL — ABNORMAL LOW (ref 0.3–1.2)
Total Protein: 5.3 g/dL — ABNORMAL LOW (ref 6.5–8.1)

## 2022-10-03 LAB — HEPARIN LEVEL (UNFRACTIONATED)
Heparin Unfractionated: 0.83 IU/mL — ABNORMAL HIGH (ref 0.30–0.70)
Heparin Unfractionated: 0.78 IU/mL — ABNORMAL HIGH (ref 0.30–0.70)
Heparin Unfractionated: 0.68 IU/mL (ref 0.30–0.70)

## 2022-10-03 LAB — CBC
HCT: 30.4 % — ABNORMAL LOW (ref 36.0–46.0)
HCT: 33.2 % — ABNORMAL LOW (ref 36.0–46.0)
Hemoglobin: 9.7 g/dL — ABNORMAL LOW (ref 12.0–15.0)
Hemoglobin: 10.9 g/dL — ABNORMAL LOW (ref 12.0–15.0)
MCH: 28.7 pg (ref 26.0–34.0)
MCH: 29.1 pg (ref 26.0–34.0)
MCHC: 31.9 g/dL (ref 30.0–36.0)
MCHC: 32.8 g/dL (ref 30.0–36.0)
MCV: 89.9 fL (ref 80.0–100.0)
MCV: 88.8 fL (ref 80.0–100.0)
Platelets: 191 10*3/uL (ref 150–400)
Platelets: 176 10*3/uL (ref 150–400)
RBC: 3.38 MIL/uL — ABNORMAL LOW (ref 3.87–5.11)
RBC: 3.74 MIL/uL — ABNORMAL LOW (ref 3.87–5.11)
RDW: 14.2 % (ref 11.5–15.5)
RDW: 14.3 % (ref 11.5–15.5)
WBC: 7.4 10*3/uL (ref 4.0–10.5)
WBC: 6.1 10*3/uL (ref 4.0–10.5)
nRBC: 0 % (ref 0.0–0.2)
nRBC: 0 % (ref 0.0–0.2)

## 2022-10-03 LAB — PROTIME-INR
INR: 1.2 (ref 0.8–1.2)
Prothrombin Time: 15.3 seconds — ABNORMAL HIGH (ref 11.4–15.2)

## 2022-10-03 LAB — APTT
aPTT: 186 seconds (ref 24–36)
aPTT: 125 seconds — ABNORMAL HIGH (ref 24–36)
aPTT: 120 seconds — ABNORMAL HIGH (ref 24–36)

## 2022-10-03 MED ORDER — SODIUM CHLORIDE 0.9 % IV SOLN
2.0000 g | INTRAVENOUS | Status: DC
  Administered 2022-10-03 – 2022-10-04 (×2): 2 g via INTRAVENOUS
  Filled 2022-10-03 (×2): qty 20

## 2022-10-03 NOTE — Progress Notes (Signed)
ANTICOAGULATION CONSULT NOTE   Pharmacy Consult for Heparin Indication:  Hx of PE with plan for surgery for symptomatic cholelithiasis likely during admission (hold eliquis with heparin bridge)  Allergies  Allergen Reactions   Bee Venom Anaphylaxis and Rash   Coconut (Cocos Nucifera) Anaphylaxis and Itching   Coconut Fatty Acids Anaphylaxis   Coconut Oil Anaphylaxis and Itching   Food Anaphylaxis and Itching    Walnuts - anaphylaxis, itching   Mixed Ragweed Anaphylaxis, Itching and Rash   Molds & Smuts Anaphylaxis   Mushroom Ext Cmplx(Shiitake-Reishi-Mait) Anaphylaxis   Nutritional Supplements Anaphylaxis and Itching    walnuts   Shellfish Allergy Anaphylaxis   Strawberry Extract Anaphylaxis   Oxycodone Itching   Latex Rash   Norco [Hydrocodone-Acetaminophen] Rash    Patient Measurements: Height: 5\' 6"  (167.6 cm) Weight: 89.8 kg (198 lb) IBW/kg (Calculated) : 59.3 Heparin Dosing Weight: 78.8 kg  Vital Signs: Temp: 98.4 F (36.9 C) (05/11 1606) Temp Source: Oral (05/11 1606) BP: 111/58 (05/11 2125) Pulse Rate: 86 (05/11 2125)  Labs: Recent Labs    10/02/22 0755 10/02/22 1642 10/03/22 0217  HGB 12.0  --  9.7*  HCT 37.8  --  30.4*  PLT 257  --  191  APTT  --  35 186*  LABPROT  --   --  15.3*  INR  --   --  1.2  HEPARINUNFRC  --  >1.10* 0.83*  CREATININE 1.00  --  0.81     Estimated Creatinine Clearance: 84.4 mL/min (by C-G formula based on SCr of 0.81 mg/dL).   Medical History: Past Medical History:  Diagnosis Date   Acute kidney injury (HCC) 10/25/2020   Acute respiratory disease due to COVID-19 virus 10/25/2020   AKI (acute kidney injury) (HCC) 10/25/2020   Allergic rhinitis    Allergic to cats    pet dander   Allergy    Anemia    Anxiety    Arthritis 09/2018   both feet   Asthma    Back pain    Bipolar disorder (HCC)    Cervicalgia    Chronic fatigue syndrome    Chronic low back pain with left-sided sciatica    Class 3 severe obesity with  serious comorbidity and body mass index (BMI) of 40.0 to 44.9 in adult [facility]) 11/14/2014   Constipation    Depression    Diabetes mellitus without complication (HCC)    "pre"   Dyspnea    Environmental allergies    Fibromyalgia    GERD (gastroesophageal reflux disease)    Grave's disease    Heart murmur    High cholesterol    History of blood clots    Hypertension    Joint pain    Lactose intolerance    Lower extremity edema    Multiple food allergies    OSA (obstructive sleep apnea)    Osteoporosis    Palpitations    Panic disorder    Pharyngoesophageal dysphagia 06/24/2014   Prediabetes 05/11/2019   Protein-calorie malnutrition, mild (HCC) 06/23/2021   Pulmonary embolism (HCC) 2024   Rheumatoid arthritis (HCC)    Risk for falls    Sciatica    Severe 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 (HCC)    Sleep apnea    no cpap worn   Subclinical hypothyroidism 05/11/2019   Syncope    Syncope and collapse    Thyroid disease    Vasovagal syncope    Vertigo    Vitamin D deficiency  Medications:  Medications Prior to Admission  Medication Sig Dispense Refill Last Dose   acetaminophen (TYLENOL) 650 MG CR tablet Take 1,300 mg by mouth as needed for pain.   10/01/2022   albuterol (VENTOLIN HFA) 108 (90 Base) MCG/ACT inhaler Inhale 2 puffs into the lungs every 6 (six) hours as needed for wheezing. 18 g 2 Past Week   apixaban (ELIQUIS) 5 MG TABS tablet Take 2 tablets (10 mg total) by mouth 2 (two) times daily for 4 days, THEN 1 tablet (5 mg total) 2 (two) times daily for 21 days. (Patient taking differently: Take 5 mg by mouth once daily ) 58 tablet 0 10/02/2022 at 0800-0900   Ascorbic Acid (VITAMIN C PO) Take 1 tablet by mouth daily.   10/01/2022   azelastine (ASTELIN) 0.1 % nasal spray Place 2 sprays into both nostrils daily as needed for allergies. Use in each nostril as directed   10/01/2022   BIOTIN PO Take 1 capsule by mouth daily.   10/01/2022   botulinum  toxin Type A (BOTOX) 200 units injection Provider to inject 155 units into the muscles of the head and neck every 12 weeks. Discard remainder. 1 each 3 5 weeks   calcium carbonate (TUMS - DOSED IN MG ELEMENTAL CALCIUM) 500 MG chewable tablet Chew 1,000 mg by mouth as needed for indigestion or heartburn.   10/01/2022   cetirizine (ZYRTEC) 10 MG tablet Take 10 mg by mouth daily.   09/30/2022   Cyanocobalamin (VITAMIN B-12 PO) Take 1 capsule by mouth daily.   10/01/2022   diclofenac Sodium (VOLTAREN) 1 % GEL Apply 4 g topically 4 (four) times daily. (Patient taking differently: Apply 1 Application topically as needed (pain).)   09/30/2022   divalproex (DEPAKOTE ER) 500 MG 24 hr tablet Take 2 tablets (1,000 mg total) by mouth at bedtime AND 1 tablet (500 mg total) daily. (Patient taking differently: Take 500 mg by mouth once daily at bedtime ) 270 tablet 0 10/01/2022   EPINEPHrine (EPIPEN 2-PAK) 0.3 mg/0.3 mL IJ SOAJ injection Inject 0.3 mg into the muscle as needed for anaphylaxis.   jan   fluticasone (FLONASE) 50 MCG/ACT nasal spray Place 1 spray into both nostrils as needed (allergies/runny nose). 16 g 0 Past Month   Galcanezumab-gnlm (EMGALITY) 120 MG/ML SOAJ INJECT 120 MG INTO THE SKIN EVERY 30 DAYS (Patient taking differently: Inject 120 mg into the skin every 30 (thirty) days.) 3 mL 1 Past Month   lidocaine (LIDODERM) 5 % Place 1 patch onto the skin daily as needed (pain).   09/26/2022   Melatonin 10 MG CHEW Chew 30 mg by mouth at bedtime as needed (sleep).   09/28/2022   montelukast (SINGULAIR) 10 MG tablet Take 10 mg by mouth at bedtime as needed (allergies).   10/01/2022   Multiple Vitamin (MULTIVITAMIN) tablet Take 1 tablet by mouth in the morning.   10/01/2022   naloxone (NARCAN) nasal spray 4 mg/0.1 mL Place 0.4 mg into the nose once.   unk   NUCYNTA 75 MG tablet Take 75 mg by mouth 2 (two) times daily as needed for moderate pain or severe pain.   09/30/2022   Rimegepant Sulfate (NURTEC) 75 MG TBDP Take 1  tablet (75 mg total) by mouth daily. (Patient taking differently: Take 75 mg by mouth as needed (migraines).) 8 tablet 0 09/20/2022   tirzepatide (MOUNJARO) 7.5 MG/0.5ML Pen Inject 7.5 mg into the skin once a week.   09/27/2022   doxepin (SINEQUAN) 150 MG capsule Take  150 mg by mouth at bedtime. (Patient not taking: Reported on 10/02/2022)   Not Taking    Scheduled:   acetaminophen  650 mg Oral Q8H   divalproex  500 mg Oral Daily   loratadine  10 mg Oral Daily   Infusions:   heparin 1,400 Units/hr (10/02/22 1853)   PRN: albuterol, HYDROmorphone (DILAUDID) injection, hydrOXYzine, melatonin, montelukast, ondansetron **OR** ondansetron (ZOFRAN) IV, tapentadol  Assessment: 1 yof with a history of DM, asthma, acid reflux, anxiety, PE on eliquis. Patient is presenting with rt flank pain. Heparin per pharmacy consult placed for  Hx of PE with plan for surgery for symptomatic cholelithiasis likely during admission (hold eliquis with heparin bridge) .  Initial heparin level 0.83 and aPTT 186 -The heparin level is trending down and would not expect the aPTT to be as high.  The RN confirmed lab collected from opposite side from heparin infusion . No overt s/sx of bleeding reported. Hgb down slightly from admission. Will reduce the rate slightly and follow up later this morning.   Patient is on apixaban prior to arrival. Last dose 5/11 ~0730 per patient. Will require aPTT monitoring due to likely falsely high anti-Xa level secondary to DOAC use.  Goal of Therapy:  Heparin level 0.3-0.7 units/ml aPTT 66-102 seconds Monitor platelets by anticoagulation protocol: Yes   Plan:  No initial heparin bolus Decrease heparin infusion to 1250 units/hr  Check aPTT & anti-Xa level in 6 hours and daily while on heparin Continue to monitor via aPTT until levels are correlated Continue to monitor H&H and platelets  Ruben Im, PharmD Clinical Pharmacist 10/03/2022 3:38 AM Please check AMION for all [facility]  Pharmacy numbers

## 2022-10-03 NOTE — Progress Notes (Signed)
PROGRESS NOTE    [name]  ZOX:[id number] DOB: Sep 01, 1965 DOA: 10/02/2022 PCP: Iona Hansen, NP   Brief Narrative:  [name] is a 57 y.o. 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  Assessment & Plan:   Active Problems:   GERD (gastroesophageal reflux disease)   Bipolar disorder (HCC)   Cholecystitis, acute with cholelithiasis  Acute gallstone cholecystitis: General surgery on board.  Rocephin resumed.  Plan for cholecystectomy once Eliquis is washed out.  Appreciate general surgery help.  History of PE in January 2024: Eliquis on hold and waiting for washout, she is on heparin in the meantime.  History of migraines: Controlled.  Continue home medications.  DVT prophylaxis: Heparin   Code Status: Full Code  Family Communication:  None present at bedside.  Plan of care discussed with patient in length and he/she verbalized understanding and agreed with it.  Status is: Inpatient Remains inpatient appropriate because: Patient will need cholecystectomy.  Timing per general surgery.   Estimated body mass index is 31.96 kg/m as calculated from the following:   Height as of this encounter: 5\' 6"  (1.676 m).   Weight as of this encounter: 89.8 kg.    Nutritional Assessment: Body mass index is 31.96 kg/m.Marland Kitchen Seen by dietician.  I agree with the assessment and plan as outlined below: Nutrition Status:        . Skin Assessment: I have examined the patient's skin and I agree with the wound assessment as performed by the wound care RN as outlined below:    Consultants:  General surgery  Procedures:  None  Antimicrobials:  Anti-infectives (From  admission, onward)    Start     Dose/Rate Route Frequency Ordered Stop   10/03/22 1015  cefTRIAXone (ROCEPHIN) 2 g in sodium chloride 0.9 % 100 mL IVPB        2 g 200 mL/hr over 30 Minutes Intravenous Every 24 hours 10/03/22 0925     10/02/22 0945  cefTRIAXone (ROCEPHIN) 2 g in sodium chloride 0.9 % 100 mL IVPB        2 g 200 mL/hr over 30 Minutes Intravenous  Once 10/02/22 8119 10/02/22 1241         Subjective: Patient seen and examined.  She says that she is feeling better.  Did not have any abdominal pain, nausea or any other complaint.  Objective: Vitals:   10/02/22 1329 10/02/22 1606 10/02/22 2125 10/03/22 0840  BP: 102/85 127/86 (!) 111/58 108/66  Pulse: 87 90 86 80  Resp: 18 18 17    Temp: 98.6 F (37 C) 98.4 F (36.9 C)  97.7 F (36.5 C)  TempSrc:  Oral  Oral  SpO2: 100% 100% 96%   Weight:      Height:        Intake/Output Summary (Last 24 hours) at 10/03/2022 1012 Last data filed at 10/02/2022 1900 Gross per 24 hour  Intake 500 ml  Output --  Net 500 ml   Filed Weights   10/02/22 0747  Weight: 89.8 kg    Examination:  General exam: Appears calm and comfortable  Respiratory system: Clear to auscultation. Respiratory effort normal. Cardiovascular system: S1 & S2 heard, RRR. No JVD, murmurs,  rubs, gallops or clicks. No pedal edema. Gastrointestinal system: Abdomen is nondistended, soft and tender at left upper quadrant, epigastric and more on the right upper quadrant. No organomegaly or masses felt. Normal bowel sounds heard. Central nervous system: Alert and oriented. No focal neurological deficits. Extremities: Symmetric 5 x 5 power. Skin: No rashes, lesions or ulcers Psychiatry: Judgement and insight appear normal. Mood & affect appropriate.    Data Reviewed: I have personally reviewed following labs and imaging studies  CBC: Recent Labs  Lab 10/02/22 0755 10/03/22 0217  WBC 14.8* 7.4  HGB 12.0 9.7*  HCT 37.8 30.4*  MCV 92.4 89.9  PLT 257 191    Basic Metabolic Panel: Recent Labs  Lab 10/02/22 0755 10/03/22 0217  NA 137 138  K 3.8 3.5  CL 103 104  CO2 24 25  GLUCOSE 93 93  BUN 23* 18  CREATININE 1.00 0.81  CALCIUM 8.6* 7.9*   GFR: Estimated Creatinine Clearance: 84.4 mL/min (by C-G formula based on SCr of 0.81 mg/dL). Liver Function Tests: Recent Labs  Lab 10/02/22 0755 10/03/22 0217  AST 18 17  ALT 15 12  ALKPHOS 81 62  BILITOT 0.4 0.1*  PROT 6.8 5.3*  ALBUMIN 3.1* 2.3*   Recent Labs  Lab 10/02/22 0755  LIPASE 22   No results for input(s): "AMMONIA" in the last 168 hours. Coagulation Profile: Recent Labs  Lab 10/03/22 0217  INR 1.2   Cardiac Enzymes: No results for input(s): "CKTOTAL", "CKMB", "CKMBINDEX", "TROPONINI" in the last 168 hours. BNP (last 3 results) No results for input(s): "PROBNP" in the last 8760 hours. HbA1C: No results for input(s): "HGBA1C" in the last 72 hours. CBG: No results for input(s): "GLUCAP" in the last 168 hours. Lipid Profile: No results for input(s): "CHOL", "HDL", "LDLCALC", "TRIG", "CHOLHDL", "LDLDIRECT" in the last 72 hours. Thyroid Function Tests: No results for input(s): "TSH", "T4TOTAL", "FREET4", "T3FREE", "THYROIDAB" in the last 72 hours. Anemia Panel: No results for input(s): "VITAMINB12", "FOLATE", "FERRITIN", "TIBC", "IRON", "RETICCTPCT" in the last 72 hours. Sepsis Labs: No results for input(s): "PROCALCITON", "LATICACIDVEN" in the last 168 hours.  No results found for this or any previous visit (from the past 240 hour(s)).   Radiology Studies: US Abdomen Limited RUQ (LIVER/GB)  Result Date: 10/02/2022 CLINICAL DATA:  151470 RUQ abdominal pain [id number] EXAM: ULTRASOUND ABDOMEN LIMITED RIGHT UPPER QUADRANT COMPARISON:  CT 05/25/2022 FINDINGS: Gallbladder: Cholelithiasis with multiple intraluminal gallstones, largest measuring up to 0.9 cm. No wall thickening. Negative sonographic Murphy sign. The gallbladder is non-dilated. Common bile duct: Diameter: 8  mm, mildly dilated. Liver: Mildly increased liver echogenicity. No focal liver lesion. Portal vein is patent on color Doppler imaging with normal direction of blood flow towards the liver. Other: None. IMPRESSION: Cholelithiasis with multiple intraluminal gallstones, largest measuring 0.9 cm. No evidence of acute cholecystitis. Mildly dilated common bile duct measuring 8 mm. Correlate with LFTs and consider further evaluation with MRCP. Mild hepatic steatosis. Electronically Signed   By: Caprice Renshaw M.D.   On: 10/02/2022 09:07   DG Chest Portable 1 View  Result Date: 10/02/2022 CLINICAL DATA:  Shortness of breath. EXAM: PORTABLE CHEST 1 VIEW COMPARISON:  06/01/2022 FINDINGS: Mild asymmetric elevation left hemidiaphragm. Interstitial markings are diffusely coarsened with chronic features. Similar streaky opacity at the bases suggest atelectasis. No overt edema or focal lung consolidation. No substantial pleural effusion. The cardio pericardial silhouette is enlarged. IMPRESSION: Chronic interstitial coarsening with bibasilar atelectasis. No acute cardiopulmonary findings. Electronically Signed   By: Minerva Areola  Molli Posey M.D.   On: 10/02/2022 08:48    Scheduled Meds:  acetaminophen  650 mg Oral Q8H   divalproex  500 mg Oral Daily   loratadine  10 mg Oral Daily   Continuous Infusions:  cefTRIAXone (ROCEPHIN)  IV     heparin 1,250 Units/hr (10/03/22 0848)     LOS: 1 day   Hughie Closs, MD [facility]  10/03/2022, 10:12 AM   *Please note that this is a verbal dictation therefore any spelling or grammatical errors are due to the "Dragon [employer] One" system interpretation.  Please page via Amion and do not message via secure chat for urgent patient care matters. Secure chat can be used for non urgent patient care matters.  How to contact the [facility] Attending or Consulting provider 7A - 7P or covering provider during after hours 7P -7A, for this patient?  Check the care team in [facility] and look for a)  attending/consulting [facility] provider listed and b) the [facility] team listed. Page or secure chat 7A-7P. Log into [email or web address] and use [facility] universal password to access. If you do not have the password, please contact the hospital operator. Locate the [facility] provider you are looking for under [facility] and page to a number that you can be directly reached. If you still have difficulty reaching the provider, please page the [facility] (Director on Call) for the Hospitalists listed on amion for assistance.

## 2022-10-03 NOTE — Plan of Care (Signed)
A/Ox4 and on room air. Prn pain intervention given x1 for abdominal pain. Up with self care. Heparin gtt lowered to 12.5. next aptt due at 10am. Pending possible lap chole.    Problem: Education: Goal: Knowledge of General Education information will improve Description: Including pain rating scale, medication(s)/side effects and non-pharmacologic comfort measures Outcome: Progressing   Problem: Health Behavior/Discharge Planning: Goal: Ability to manage health-related needs will improve Outcome: Progressing   Problem: Clinical Measurements: Goal: Ability to maintain clinical measurements within normal limits will improve Outcome: Progressing Goal: Will remain free from infection Outcome: Progressing Goal: Diagnostic test results will improve Outcome: Progressing Goal: Respiratory complications will improve Outcome: Progressing Goal: Cardiovascular complication will be avoided Outcome: Progressing   Problem: Activity: Goal: Risk for activity intolerance will decrease Outcome: Progressing   Problem: Nutrition: Goal: Adequate nutrition will be maintained Outcome: Progressing   Problem: Coping: Goal: Level of anxiety will decrease Outcome: Progressing   Problem: Elimination: Goal: Will not experience complications related to bowel motility Outcome: Progressing Goal: Will not experience complications related to urinary retention Outcome: Progressing   Problem: Pain Managment: Goal: General experience of comfort will improve Outcome: Progressing   Problem: Safety: Goal: Ability to remain free from injury will improve Outcome: Progressing   Problem: Skin Integrity: Goal: Risk for impaired skin integrity will decrease Outcome: Progressing

## 2022-10-03 NOTE — Progress Notes (Signed)
Subjective: CC: Pain improved but still some RUQ pain today. Tol cld without n/v. Hx RNY gastric bypass in ~2009 by Dr. Johna Sheriff  Objective: Vital signs in last 24 hours: Temp:  [97.7 F (36.5 C)-98.6 F (37 C)] 97.7 F (36.5 C) (05/12 0840) Pulse Rate:  [80-96] 80 (05/12 0840) Resp:  [17-27] 17 (05/11 2125) BP: (102-127)/(58-91) 108/66 (05/12 0840) SpO2:  [96 %-100 %] 96 % (05/11 2125) Last BM Date : 10/01/22  Intake/Output from previous day: 05/11 0701 - 05/12 0700 In: 500 [P.O.:500] Out: -  Intake/Output this shift: No intake/output data recorded.  PE: Gen:  Alert, NAD, pleasant Abd: Soft, ND, RUQ ttp, no rigidity or guarding, +BS Psych: A&Ox3   Lab Results:  Recent Labs    10/02/22 0755 10/03/22 0217  WBC 14.8* 7.4  HGB 12.0 9.7*  HCT 37.8 30.4*  PLT 257 191   BMET Recent Labs    10/02/22 0755 10/03/22 0217  NA 137 138  K 3.8 3.5  CL 103 104  CO2 24 25  GLUCOSE 93 93  BUN 23* 18  CREATININE 1.00 0.81  CALCIUM 8.6* 7.9*   PT/INR Recent Labs    10/03/22 0217  LABPROT 15.3*  INR 1.2   CMP     Component Value Date/Time   NA 138 10/03/2022 0217   NA 143 07/09/2020 1536   K 3.5 10/03/2022 0217   CL 104 10/03/2022 0217   CO2 25 10/03/2022 0217   GLUCOSE 93 10/03/2022 0217   BUN 18 10/03/2022 0217   BUN 19 07/09/2020 1536   CREATININE 0.81 10/03/2022 0217   CREATININE 0.96 08/03/2021 1352   CREATININE 0.85 06/12/2014 2031   CALCIUM 7.9 (L) 10/03/2022 0217   PROT 5.3 (L) 10/03/2022 0217   PROT 7.0 07/09/2020 1536   ALBUMIN 2.3 (L) 10/03/2022 0217   ALBUMIN 4.2 07/09/2020 1536   AST 17 10/03/2022 0217   AST 12 (L) 08/03/2021 1352   ALT 12 10/03/2022 0217   ALT 14 08/03/2021 1352   ALKPHOS 62 10/03/2022 0217   BILITOT 0.1 (L) 10/03/2022 0217   BILITOT 0.3 08/03/2021 1352   GFRNONAA >60 10/03/2022 0217   GFRNONAA >60 08/03/2021 1352   GFRAA 95 07/09/2020 1536   Lipase     Component Value Date/Time   LIPASE 22 10/02/2022  0755    Studies/Results: US Abdomen Limited RUQ (LIVER/GB)  Result Date: 10/02/2022 CLINICAL DATA:  [id number] RUQ abdominal pain [id number] EXAM: ULTRASOUND ABDOMEN LIMITED RIGHT UPPER QUADRANT COMPARISON:  CT 05/25/2022 FINDINGS: Gallbladder: Cholelithiasis with multiple intraluminal gallstones, largest measuring up to 0.9 cm. No wall thickening. Negative sonographic Murphy sign. The gallbladder is non-dilated. Common bile duct: Diameter: 8 mm, mildly dilated. Liver: Mildly increased liver echogenicity. No focal liver lesion. Portal vein is patent on color Doppler imaging with normal direction of blood flow towards the liver. Other: None. IMPRESSION: Cholelithiasis with multiple intraluminal gallstones, largest measuring 0.9 cm. No evidence of acute cholecystitis. Mildly dilated common bile duct measuring 8 mm. Correlate with LFTs and consider further evaluation with MRCP. Mild hepatic steatosis. Electronically Signed   By: Caprice Renshaw M.D.   On: 10/02/2022 09:07   DG Chest Portable 1 View  Result Date: 10/02/2022 CLINICAL DATA:  Shortness of breath. EXAM: PORTABLE CHEST 1 VIEW COMPARISON:  06/01/2022 FINDINGS: Mild asymmetric elevation left hemidiaphragm. Interstitial markings are diffusely coarsened with chronic features. Similar streaky opacity at the bases suggest atelectasis. No overt edema or focal lung consolidation. No substantial  pleural effusion. The cardio pericardial silhouette is enlarged. IMPRESSION: Chronic interstitial coarsening with bibasilar atelectasis. No acute cardiopulmonary findings. Electronically Signed   By: Kennith Center M.D.   On: 10/02/2022 08:48    Anti-infectives: Anti-infectives (From admission, onward)    Start     Dose/Rate Route Frequency Ordered Stop   10/02/22 0945  cefTRIAXone (ROCEPHIN) 2 g in sodium chloride 0.9 % 100 mL IVPB        2 g 200 mL/hr over 30 Minutes Intravenous  Once 10/02/22 0942 10/02/22 1241        Assessment/Plan Acute Cholecystitis  -  Cont abx - CBD 8mm on [name], no obvious Choledocholithiasis, LFT's wnl. Repeat in AM - Hold heparin at midnight. Last dose Eliquis, 5/11 am - I have explained the procedure, risks, and aftercare of Laparoscopic cholecystectomy with IOC.  Risks include but are not limited to anesthesia (MI, CVA, death, prolonged intubation and aspiration), bleeding, infection, wound problems, hernia, bile leak, injury to common bile duct/liver/intestine, possible need for subtotal cholecystectomy or open cholecystectomy, increased risk of DVT/PE and diarrhea post op.   She seems to understand and agrees to proceed. Plan OR in AM.   FEN - CLD, NPO midnight. IVF VTE - SCDs, heparin gtt (hold at midnight) ID - Rocephin  Hx PE on Eliquis  Hx gastric bypass  I reviewed nursing notes, hospitalist notes, last 24 h vitals and pain scores, last 48 h intake and output, last 24 h labs and trends, and last 24 h imaging results.    LOS: 1 day    Jacinto Halim , [facility] 10/03/2022, 9:03 AM Please see Amion for pager number during day hours 7:00am-4:30pm

## 2022-10-03 NOTE — Progress Notes (Signed)
ANTICOAGULATION CONSULT NOTE   Pharmacy Consult for Heparin Indication:  Hx of PE with plan for surgery for symptomatic cholelithiasis likely during admission (hold eliquis with heparin bridge)  Allergies  Allergen Reactions   Bee Venom Anaphylaxis and Rash   Coconut (Cocos Nucifera) Anaphylaxis and Itching   Coconut Fatty Acids Anaphylaxis   Coconut Oil Anaphylaxis and Itching   Food Anaphylaxis and Itching    Walnuts - anaphylaxis, itching   Mixed Ragweed Anaphylaxis, Itching and Rash   Molds & Smuts Anaphylaxis   Mushroom Ext Cmplx(Shiitake-Reishi-Mait) Anaphylaxis   Nutritional Supplements Anaphylaxis and Itching    walnuts   Shellfish Allergy Anaphylaxis   Strawberry Extract Anaphylaxis   Oxycodone Itching   Latex Rash   Norco [Hydrocodone-Acetaminophen] Rash    Patient Measurements: Height: 5\' 6"  (167.6 cm) Weight: 89.8 kg (198 lb) IBW/kg (Calculated) : 59.3 Heparin Dosing Weight: 78.8 kg  Vital Signs: Temp: 98.7 F (37.1 C) (05/12 1606) Temp Source: Oral (05/12 1606) BP: 110/71 (05/12 1606) Pulse Rate: 84 (05/12 1606)  Labs: Recent Labs    10/02/22 0755 10/02/22 1642 10/03/22 0217 10/03/22 1017 10/03/22 1742  HGB 12.0  --  9.7* 10.9*  --   HCT 37.8  --  30.4* 33.2*  --   PLT 257  --  191 176  --   APTT  --    < > 186* 125* 120*  LABPROT  --   --  15.3*  --   --   INR  --   --  1.2  --   --   HEPARINUNFRC  --    < > 0.83* 0.78* 0.68  CREATININE 1.00  --  0.81  --   --    < > = values in this interval not displayed.     Estimated Creatinine Clearance: 84.4 mL/min (by C-G formula based on SCr of 0.81 mg/dL).   Medical History: Past Medical History:  Diagnosis Date   Acute kidney injury (HCC) 10/25/2020   Acute respiratory disease due to COVID-19 virus 10/25/2020   AKI (acute kidney injury) (HCC) 10/25/2020   Allergic rhinitis    Allergic to cats    pet dander   Allergy    Anemia    Anxiety    Arthritis 09/2018   both feet   Asthma     Back pain    Bipolar disorder (HCC)    Cervicalgia    Chronic fatigue syndrome    Chronic low back pain with left-sided sciatica    Class 3 severe obesity with serious comorbidity and body mass index (BMI) of 40.0 to 44.9 in adult [facility]) 11/14/2014   Constipation    Depression    Diabetes mellitus without complication (HCC)    "pre"   Dyspnea    Environmental allergies    Fibromyalgia    GERD (gastroesophageal reflux disease)    Grave's disease    Heart murmur    High cholesterol    History of blood clots    Hypertension    Joint pain    Lactose intolerance    Lower extremity edema    Multiple food allergies    OSA (obstructive sleep apnea)    Osteoporosis    Palpitations    Panic disorder    Pharyngoesophageal dysphagia 06/24/2014   Prediabetes 05/11/2019   Protein-calorie malnutrition, mild (HCC) 06/23/2021   Pulmonary embolism (HCC) 2024   Rheumatoid arthritis (HCC)    Risk for falls    Sciatica    Severe recurrent  major depressive disorder with psychotic features (HCC)    Sleep apnea    no cpap worn   Subclinical hypothyroidism 05/11/2019   Syncope    Syncope and collapse    Thyroid disease    Vasovagal syncope    Vertigo    Vitamin D deficiency     Medications:  Medications Prior to Admission  Medication Sig Dispense Refill Last Dose   acetaminophen (TYLENOL) 650 MG CR tablet Take 1,300 mg by mouth as needed for pain.   10/01/2022   albuterol (VENTOLIN HFA) 108 (90 Base) MCG/ACT inhaler Inhale 2 puffs into the lungs every 6 (six) hours as needed for wheezing. 18 g 2 Past Week   apixaban (ELIQUIS) 5 MG TABS tablet Take 2 tablets (10 mg total) by mouth 2 (two) times daily for 4 days, THEN 1 tablet (5 mg total) 2 (two) times daily for 21 days. (Patient taking differently: Take 5 mg by mouth once daily ) 58 tablet 0 10/02/2022 at 0800-0900   Ascorbic Acid (VITAMIN C PO) Take 1 tablet by mouth daily.   10/01/2022   azelastine (ASTELIN) 0.1 % nasal spray Place 2 sprays  into both nostrils daily as needed for allergies. Use in each nostril as directed   10/01/2022   BIOTIN PO Take 1 capsule by mouth daily.   10/01/2022   botulinum toxin Type A (BOTOX) 200 units injection Provider to inject 155 units into the muscles of the head and neck every 12 weeks. Discard remainder. 1 each 3 5 weeks   calcium carbonate (TUMS - DOSED IN MG ELEMENTAL CALCIUM) 500 MG chewable tablet Chew 1,000 mg by mouth as needed for indigestion or heartburn.   10/01/2022   cetirizine (ZYRTEC) 10 MG tablet Take 10 mg by mouth daily.   09/30/2022   Cyanocobalamin (VITAMIN B-12 PO) Take 1 capsule by mouth daily.   10/01/2022   diclofenac Sodium (VOLTAREN) 1 % GEL Apply 4 g topically 4 (four) times daily. (Patient taking differently: Apply 1 Application topically as needed (pain).)   09/30/2022   divalproex (DEPAKOTE ER) 500 MG 24 hr tablet Take 2 tablets (1,000 mg total) by mouth at bedtime AND 1 tablet (500 mg total) daily. (Patient taking differently: Take 500 mg by mouth once daily at bedtime ) 270 tablet 0 10/01/2022   EPINEPHrine (EPIPEN 2-PAK) 0.3 mg/0.3 mL IJ SOAJ injection Inject 0.3 mg into the muscle as needed for anaphylaxis.   jan   fluticasone (FLONASE) 50 MCG/ACT nasal spray Place 1 spray into both nostrils as needed (allergies/runny nose). 16 g 0 Past Month   Galcanezumab-gnlm (EMGALITY) 120 MG/ML SOAJ INJECT 120 MG INTO THE SKIN EVERY 30 DAYS (Patient taking differently: Inject 120 mg into the skin every 30 (thirty) days.) 3 mL 1 Past Month   lidocaine (LIDODERM) 5 % Place 1 patch onto the skin daily as needed (pain).   09/26/2022   Melatonin 10 MG CHEW Chew 30 mg by mouth at bedtime as needed (sleep).   09/28/2022   montelukast (SINGULAIR) 10 MG tablet Take 10 mg by mouth at bedtime as needed (allergies).   10/01/2022   Multiple Vitamin (MULTIVITAMIN) tablet Take 1 tablet by mouth in the morning.   10/01/2022   naloxone (NARCAN) nasal spray 4 mg/0.1 mL Place 0.4 mg into the nose once.   unk    NUCYNTA 75 MG tablet Take 75 mg by mouth 2 (two) times daily as needed for moderate pain or severe pain.   09/30/2022   Rimegepant  Sulfate (NURTEC) 75 MG TBDP Take 1 tablet (75 mg total) by mouth daily. (Patient taking differently: Take 75 mg by mouth as needed (migraines).) 8 tablet 0 09/20/2022   tirzepatide (MOUNJARO) 7.5 MG/0.5ML Pen Inject 7.5 mg into the skin once a week.   09/27/2022   doxepin (SINEQUAN) 150 MG capsule Take 150 mg by mouth at bedtime. (Patient not taking: Reported on 10/02/2022)   Not Taking    Scheduled:   acetaminophen  650 mg Oral Q8H   divalproex  500 mg Oral Daily   loratadine  10 mg Oral Daily   Infusions:   cefTRIAXone (ROCEPHIN)  IV 2 g (10/03/22 1026)   heparin 1,150 Units/hr (10/03/22 1130)   PRN: albuterol, HYDROmorphone (DILAUDID) injection, hydrOXYzine, melatonin, montelukast, ondansetron **OR** ondansetron (ZOFRAN) IV, tapentadol  Assessment: 13 yof with a history of DM, asthma, acid reflux, anxiety, PE on eliquis. Patient is presenting with rt flank pain. Heparin per pharmacy consult placed for  Hx of PE with plan for surgery for symptomatic cholelithiasis likely during admission (hold eliquis with heparin bridge) .  Patient is on apixaban prior to arrival. Last dose 5/11 ~0730 per patient.  PTT remains supratherapeutic at 120 sec; HL elevated due to apixaban use but starting to trend down. No issues with infusion or bleeding reported.    Goal of Therapy:  Heparin level 0.3-0.7 units/ml aPTT 66-102 seconds Monitor platelets by anticoagulation protocol: Yes   Plan:  Decrease heparin infusion to 1050 units/hr  Heparin to stop at midnight tonight Follow up plans for resuming anticoagulation after surgery   Thank you for allowing pharmacy to be a part of this patient's care.   Loralee Pacas, PharmD, BCPS 10/03/2022 7:34 PM  **Pharmacist phone directory can be found on [email or web address] listed under [facility]**

## 2022-10-03 NOTE — Progress Notes (Signed)
ANTICOAGULATION CONSULT NOTE   Pharmacy Consult for Heparin Indication:  Hx of PE with plan for surgery for symptomatic cholelithiasis likely during admission (hold eliquis with heparin bridge)  Allergies  Allergen Reactions   Bee Venom Anaphylaxis and Rash   Coconut (Cocos Nucifera) Anaphylaxis and Itching   Coconut Fatty Acids Anaphylaxis   Coconut Oil Anaphylaxis and Itching   Food Anaphylaxis and Itching    Walnuts - anaphylaxis, itching   Mixed Ragweed Anaphylaxis, Itching and Rash   Molds & Smuts Anaphylaxis   Mushroom Ext Cmplx(Shiitake-Reishi-Mait) Anaphylaxis   Nutritional Supplements Anaphylaxis and Itching    walnuts   Shellfish Allergy Anaphylaxis   Strawberry Extract Anaphylaxis   Oxycodone Itching   Latex Rash   Norco [Hydrocodone-Acetaminophen] Rash    Patient Measurements: Height: 5\' 6"  (167.6 cm) Weight: 89.8 kg (198 lb) IBW/kg (Calculated) : 59.3 Heparin Dosing Weight: 78.8 kg  Vital Signs: Temp: 97.7 F (36.5 C) (05/12 0840) Temp Source: Oral (05/12 0840) BP: 108/66 (05/12 0840) Pulse Rate: 80 (05/12 0840)  Labs: Recent Labs    10/02/22 0755 10/02/22 1642 10/03/22 0217 10/03/22 1017  HGB 12.0  --  9.7* 10.9*  HCT 37.8  --  30.4* 33.2*  PLT 257  --  191 176  APTT  --  35 186*  --   LABPROT  --   --  15.3*  --   INR  --   --  1.2  --   HEPARINUNFRC  --  >1.10* 0.83* 0.78*  CREATININE 1.00  --  0.81  --      Estimated Creatinine Clearance: 84.4 mL/min (by C-G formula based on SCr of 0.81 mg/dL).   Medical History: Past Medical History:  Diagnosis Date   Acute kidney injury (HCC) 10/25/2020   Acute respiratory disease due to COVID-19 virus 10/25/2020   AKI (acute kidney injury) (HCC) 10/25/2020   Allergic rhinitis    Allergic to cats    pet dander   Allergy    Anemia    Anxiety    Arthritis 09/2018   both feet   Asthma    Back pain    Bipolar disorder (HCC)    Cervicalgia    Chronic fatigue syndrome    Chronic low back pain  with left-sided sciatica    Class 3 severe obesity with serious comorbidity and body mass index (BMI) of 40.0 to 44.9 in adult [facility]) 11/14/2014   Constipation    Depression    Diabetes mellitus without complication (HCC)    "pre"   Dyspnea    Environmental allergies    Fibromyalgia    GERD (gastroesophageal reflux disease)    Grave's disease    Heart murmur    High cholesterol    History of blood clots    Hypertension    Joint pain    Lactose intolerance    Lower extremity edema    Multiple food allergies    OSA (obstructive sleep apnea)    Osteoporosis    Palpitations    Panic disorder    Pharyngoesophageal dysphagia 06/24/2014   Prediabetes 05/11/2019   Protein-calorie malnutrition, mild (HCC) 06/23/2021   Pulmonary embolism (HCC) 2024   Rheumatoid arthritis (HCC)    Risk for falls    Sciatica    Severe 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 (HCC)    Sleep apnea    no cpap worn   Subclinical hypothyroidism 05/11/2019   Syncope    Syncope and collapse    Thyroid disease  Vasovagal syncope    Vertigo    Vitamin D deficiency     Medications:  Medications Prior to Admission  Medication Sig Dispense Refill Last Dose   acetaminophen (TYLENOL) 650 MG CR tablet Take 1,300 mg by mouth as needed for pain.   10/01/2022   albuterol (VENTOLIN HFA) 108 (90 Base) MCG/ACT inhaler Inhale 2 puffs into the lungs every 6 (six) hours as needed for wheezing. 18 g 2 Past Week   apixaban (ELIQUIS) 5 MG TABS tablet Take 2 tablets (10 mg total) by mouth 2 (two) times daily for 4 days, THEN 1 tablet (5 mg total) 2 (two) times daily for 21 days. (Patient taking differently: Take 5 mg by mouth once daily ) 58 tablet 0 10/02/2022 at 0800-0900   Ascorbic Acid (VITAMIN C PO) Take 1 tablet by mouth daily.   10/01/2022   azelastine (ASTELIN) 0.1 % nasal spray Place 2 sprays into both nostrils daily as needed for allergies. Use in each nostril as directed   10/01/2022   BIOTIN PO  Take 1 capsule by mouth daily.   10/01/2022   botulinum toxin Type A (BOTOX) 200 units injection Provider to inject 155 units into the muscles of the head and neck every 12 weeks. Discard remainder. 1 each 3 5 weeks   calcium carbonate (TUMS - DOSED IN MG ELEMENTAL CALCIUM) 500 MG chewable tablet Chew 1,000 mg by mouth as needed for indigestion or heartburn.   10/01/2022   cetirizine (ZYRTEC) 10 MG tablet Take 10 mg by mouth daily.   09/30/2022   Cyanocobalamin (VITAMIN B-12 PO) Take 1 capsule by mouth daily.   10/01/2022   diclofenac Sodium (VOLTAREN) 1 % GEL Apply 4 g topically 4 (four) times daily. (Patient taking differently: Apply 1 Application topically as needed (pain).)   09/30/2022   divalproex (DEPAKOTE ER) 500 MG 24 hr tablet Take 2 tablets (1,000 mg total) by mouth at bedtime AND 1 tablet (500 mg total) daily. (Patient taking differently: Take 500 mg by mouth once daily at bedtime ) 270 tablet 0 10/01/2022   EPINEPHrine (EPIPEN 2-PAK) 0.3 mg/0.3 mL IJ SOAJ injection Inject 0.3 mg into the muscle as needed for anaphylaxis.   jan   fluticasone (FLONASE) 50 MCG/ACT nasal spray Place 1 spray into both nostrils as needed (allergies/runny nose). 16 g 0 Past Month   Galcanezumab-gnlm (EMGALITY) 120 MG/ML SOAJ INJECT 120 MG INTO THE SKIN EVERY 30 DAYS (Patient taking differently: Inject 120 mg into the skin every 30 (thirty) days.) 3 mL 1 Past Month   lidocaine (LIDODERM) 5 % Place 1 patch onto the skin daily as needed (pain).   09/26/2022   Melatonin 10 MG CHEW Chew 30 mg by mouth at bedtime as needed (sleep).   09/28/2022   montelukast (SINGULAIR) 10 MG tablet Take 10 mg by mouth at bedtime as needed (allergies).   10/01/2022   Multiple Vitamin (MULTIVITAMIN) tablet Take 1 tablet by mouth in the morning.   10/01/2022   naloxone (NARCAN) nasal spray 4 mg/0.1 mL Place 0.4 mg into the nose once.   unk   NUCYNTA 75 MG tablet Take 75 mg by mouth 2 (two) times daily as needed for moderate pain or severe pain.    09/30/2022   Rimegepant Sulfate (NURTEC) 75 MG TBDP Take 1 tablet (75 mg total) by mouth daily. (Patient taking differently: Take 75 mg by mouth as needed (migraines).) 8 tablet 0 09/20/2022   tirzepatide (MOUNJARO) 7.5 MG/0.5ML Pen Inject 7.5 mg into  the skin once a week.   09/27/2022   doxepin (SINEQUAN) 150 MG capsule Take 150 mg by mouth at bedtime. (Patient not taking: Reported on 10/02/2022)   Not Taking    Scheduled:   acetaminophen  650 mg Oral Q8H   divalproex  500 mg Oral Daily   loratadine  10 mg Oral Daily   Infusions:   cefTRIAXone (ROCEPHIN)  IV 2 g (10/03/22 1026)   heparin 1,250 Units/hr (10/03/22 0848)   PRN: albuterol, HYDROmorphone (DILAUDID) injection, hydrOXYzine, melatonin, montelukast, ondansetron **OR** ondansetron (ZOFRAN) IV, tapentadol  Assessment: 53 yof with a history of DM, asthma, acid reflux, anxiety, PE on eliquis. Patient is presenting with rt flank pain. Heparin per pharmacy consult placed for  Hx of PE with plan for surgery for symptomatic cholelithiasis likely during admission (hold eliquis with heparin bridge) .  Patient is on apixaban prior to arrival. Last dose 5/11 ~0730 per patient.  Heparin level is Suprathrapeutic at 0.78. No issues with infusion or bleeding reported.    Goal of Therapy:  Heparin level 0.3-0.7 units/ml aPTT 66-102 seconds Monitor platelets by anticoagulation protocol: Yes   Plan:  Decrease heparin infusion to 1150 units/hr  Check anti-Xa level in 6-8 hours and daily while on heparin Continue to monitor via aPTT until levels are correlated Continue to monitor H&H and platelets   Thank you for allowing pharmacy to be a part of this patient's care.   Signe Colt, PharmD 10/03/2022 11:02 AM  **Pharmacist phone directory can be found on [email or web address] listed under [facility]**

## 2022-10-04 ENCOUNTER — Encounter (HOSPITAL_COMMUNITY)
Admission: EM | Disposition: A | Discharge status: Home / Self Care | Payer: Self-pay | Source: Home / Self Care | Attending: Family Medicine

## 2022-10-04 ENCOUNTER — Other Ambulatory Visit: Payer: Self-pay

## 2022-10-04 ENCOUNTER — Inpatient Hospital Stay (HOSPITAL_COMMUNITY): Payer: 59 | Admitting: Anesthesiology

## 2022-10-04 ENCOUNTER — Encounter (HOSPITAL_COMMUNITY): Payer: Self-pay | Admitting: Internal Medicine

## 2022-10-04 ENCOUNTER — Inpatient Hospital Stay (HOSPITAL_COMMUNITY): Payer: 59

## 2022-10-04 DIAGNOSIS — K810 Acute cholecystitis: Secondary | ICD-10-CM | POA: Diagnosis not present

## 2022-10-04 DIAGNOSIS — G4730 Sleep apnea, unspecified: Secondary | ICD-10-CM

## 2022-10-04 DIAGNOSIS — J45909 Unspecified asthma, uncomplicated: Secondary | ICD-10-CM

## 2022-10-04 DIAGNOSIS — I10 Essential (primary) hypertension: Secondary | ICD-10-CM

## 2022-10-04 DIAGNOSIS — K8000 Calculus of gallbladder with acute cholecystitis without obstruction: Secondary | ICD-10-CM

## 2022-10-04 HISTORY — PX: CHOLECYSTECTOMY: SHX55

## 2022-10-04 LAB — CBC
HCT: 32.5 % — ABNORMAL LOW (ref 36.0–46.0)
Hemoglobin: 10.3 g/dL — ABNORMAL LOW (ref 12.0–15.0)
MCH: 28.5 pg (ref 26.0–34.0)
MCHC: 31.7 g/dL (ref 30.0–36.0)
MCV: 90 fL (ref 80.0–100.0)
Platelets: 222 10*3/uL (ref 150–400)
RBC: 3.61 MIL/uL — ABNORMAL LOW (ref 3.87–5.11)
RDW: 14.2 % (ref 11.5–15.5)
WBC: 5.4 10*3/uL (ref 4.0–10.5)
nRBC: 0 % (ref 0.0–0.2)

## 2022-10-04 LAB — COMPREHENSIVE METABOLIC PANEL
ALT: 13 U/L (ref 0–44)
AST: 16 U/L (ref 15–41)
Albumin: 2.5 g/dL — ABNORMAL LOW (ref 3.5–5.0)
Alkaline Phosphatase: 63 U/L (ref 38–126)
Anion gap: 10 (ref 5–15)
BUN: 8 mg/dL (ref 6–20)
CO2: 28 mmol/L (ref 22–32)
Calcium: 8.3 mg/dL — ABNORMAL LOW (ref 8.9–10.3)
Chloride: 102 mmol/L (ref 98–111)
Creatinine, Ser: 0.78 mg/dL (ref 0.44–1.00)
GFR, Estimated: >60 mL/min (ref >60)
Glucose, Bld: 77 mg/dL (ref 70–99)
Potassium: 4.1 mmol/L (ref 3.5–5.1)
Sodium: 140 mmol/L (ref 135–145)
Total Bilirubin: 0.3 mg/dL (ref 0.3–1.2)
Total Protein: 5.7 g/dL — ABNORMAL LOW (ref 6.5–8.1)

## 2022-10-04 LAB — APTT: aPTT: 44 seconds — ABNORMAL HIGH (ref 24–36)

## 2022-10-04 LAB — HEPARIN LEVEL (UNFRACTIONATED): Heparin Unfractionated: 0.23 IU/mL — ABNORMAL LOW (ref 0.30–0.70)

## 2022-10-04 LAB — GLUCOSE, CAPILLARY: Glucose-Capillary: 102 mg/dL — ABNORMAL HIGH (ref 70–99)

## 2022-10-04 SURGERY — LAPAROSCOPIC CHOLECYSTECTOMY WITH INTRAOPERATIVE CHOLANGIOGRAM
Anesthesia: General

## 2022-10-04 MED ORDER — CHLORHEXIDINE GLUCONATE 0.12 % MT SOLN
OROMUCOSAL | Status: AC
  Administered 2022-10-04: 15 mL via OROMUCOSAL
  Filled 2022-10-04: qty 15

## 2022-10-04 MED ORDER — AMISULPRIDE (ANTIEMETIC) 5 MG/2ML IV SOLN: 10.0000 mg | Freq: Once | INTRAVENOUS | Status: DC | PRN

## 2022-10-04 MED ORDER — INDOCYANINE GREEN 25 MG IV SOLR
INTRAVENOUS | Status: DC | PRN
  Administered 2022-10-04: 2.5 mg via INTRAVENOUS

## 2022-10-04 MED ORDER — BUPIVACAINE-EPINEPHRINE 0.25% -1:200000 IJ SOLN
INTRAMUSCULAR | Status: DC | PRN
  Administered 2022-10-04: 10 mL

## 2022-10-04 MED ORDER — ROCURONIUM BROMIDE 10 MG/ML (PF) SYRINGE
PREFILLED_SYRINGE | INTRAVENOUS | Status: DC | PRN
  Administered 2022-10-04: 60 mg via INTRAVENOUS

## 2022-10-04 MED ORDER — PROPOFOL 10 MG/ML IV BOLUS
INTRAVENOUS | Status: AC
  Filled 2022-10-04: qty 20

## 2022-10-04 MED ORDER — METHOCARBAMOL 500 MG PO TABS
500.0000 mg | ORAL_TABLET | Freq: Four times a day (QID) | ORAL | Status: DC | PRN
  Administered 2022-10-04 – 2022-10-05 (×2): 500 mg via ORAL
  Filled 2022-10-04: qty 1

## 2022-10-04 MED ORDER — SUGAMMADEX SODIUM 200 MG/2ML IV SOLN
INTRAVENOUS | Status: DC | PRN
  Administered 2022-10-04: 200 mg via INTRAVENOUS

## 2022-10-04 MED ORDER — METHOCARBAMOL 500 MG PO TABS
ORAL_TABLET | ORAL | Status: AC
  Filled 2022-10-04: qty 1

## 2022-10-04 MED ORDER — ORAL CARE MOUTH RINSE: 15.0000 mL | Freq: Once | OROMUCOSAL | Status: AC

## 2022-10-04 MED ORDER — DEXAMETHASONE SODIUM PHOSPHATE 10 MG/ML IJ SOLN
INTRAMUSCULAR | Status: DC | PRN
  Administered 2022-10-04: 10 mg via INTRAVENOUS

## 2022-10-04 MED ORDER — FENTANYL CITRATE (PF) 250 MCG/5ML IJ SOLN
INTRAMUSCULAR | Status: AC
  Filled 2022-10-04: qty 5

## 2022-10-04 MED ORDER — CHLORHEXIDINE GLUCONATE 0.12 % MT SOLN: 15.0000 mL | Freq: Once | OROMUCOSAL | Status: AC

## 2022-10-04 MED ORDER — SODIUM CHLORIDE 0.9 % IV SOLN
INTRAVENOUS | Status: DC | PRN
  Administered 2022-10-04: 20 mL

## 2022-10-04 MED ORDER — ADULT MULTIVITAMIN W/MINERALS CH
1.0000 | ORAL_TABLET | Freq: Every day | ORAL | Status: DC
  Administered 2022-10-04 – 2022-10-06 (×3): 1 via ORAL
  Filled 2022-10-04 (×3): qty 1

## 2022-10-04 MED ORDER — PHENYLEPHRINE 80 MCG/ML (10ML) SYRINGE FOR IV PUSH (FOR BLOOD PRESSURE SUPPORT)
PREFILLED_SYRINGE | INTRAVENOUS | Status: AC
  Filled 2022-10-04: qty 10

## 2022-10-04 MED ORDER — DOCUSATE SODIUM 100 MG PO CAPS
100.0000 mg | ORAL_CAPSULE | Freq: Two times a day (BID) | ORAL | Status: DC
  Administered 2022-10-04 – 2022-10-06 (×5): 100 mg via ORAL
  Filled 2022-10-04 (×5): qty 1

## 2022-10-04 MED ORDER — SODIUM CHLORIDE 0.9 % IR SOLN
Status: DC | PRN
  Administered 2022-10-04: 1000 mL

## 2022-10-04 MED ORDER — BUPIVACAINE-EPINEPHRINE (PF) 0.25% -1:200000 IJ SOLN
INTRAMUSCULAR | Status: AC
  Filled 2022-10-04: qty 30

## 2022-10-04 MED ORDER — PHENYLEPHRINE 80 MCG/ML (10ML) SYRINGE FOR IV PUSH (FOR BLOOD PRESSURE SUPPORT)
PREFILLED_SYRINGE | INTRAVENOUS | Status: DC | PRN
  Administered 2022-10-04 (×3): 80 ug via INTRAVENOUS

## 2022-10-04 MED ORDER — PROPOFOL 10 MG/ML IV BOLUS
INTRAVENOUS | Status: DC | PRN
  Administered 2022-10-04: 200 mg via INTRAVENOUS

## 2022-10-04 MED ORDER — HYDROMORPHONE HCL 1 MG/ML IJ SOLN
0.5000 mg | INTRAMUSCULAR | Status: DC | PRN
  Administered 2022-10-04 (×3): 0.5 mg via INTRAVENOUS
  Administered 2022-10-05: 1 mg via INTRAVENOUS
  Administered 2022-10-05 (×2): 0.5 mg via INTRAVENOUS
  Filled 2022-10-04: qty 0.5
  Filled 2022-10-04: qty 1
  Filled 2022-10-04 (×4): qty 0.5

## 2022-10-04 MED ORDER — MIDAZOLAM HCL 2 MG/2ML IJ SOLN
INTRAMUSCULAR | Status: DC | PRN
  Administered 2022-10-04: 2 mg via INTRAVENOUS

## 2022-10-04 MED ORDER — TRAMADOL HCL 50 MG PO TABS
50.0000 mg | ORAL_TABLET | Freq: Four times a day (QID) | ORAL | Status: DC | PRN
  Administered 2022-10-04 – 2022-10-05 (×3): 100 mg via ORAL
  Filled 2022-10-04 (×2): qty 2

## 2022-10-04 MED ORDER — LACTATED RINGERS IV SOLN: INTRAVENOUS | Status: DC

## 2022-10-04 MED ORDER — FENTANYL CITRATE (PF) 100 MCG/2ML IJ SOLN
25.0000 ug | INTRAMUSCULAR | Status: DC | PRN
  Administered 2022-10-04 (×3): 25 ug via INTRAVENOUS
  Administered 2022-10-04: 50 ug via INTRAVENOUS
  Administered 2022-10-04: 25 ug via INTRAVENOUS

## 2022-10-04 MED ORDER — TRAMADOL HCL 50 MG PO TABS
ORAL_TABLET | ORAL | Status: AC
  Filled 2022-10-04: qty 2

## 2022-10-04 MED ORDER — LIDOCAINE 2% (20 MG/ML) 5 ML SYRINGE
INTRAMUSCULAR | Status: DC | PRN
  Administered 2022-10-04: 100 mg via INTRAVENOUS

## 2022-10-04 MED ORDER — FENTANYL CITRATE (PF) 100 MCG/2ML IJ SOLN
INTRAMUSCULAR | Status: AC
  Filled 2022-10-04: qty 2

## 2022-10-04 MED ORDER — FENTANYL CITRATE (PF) 250 MCG/5ML IJ SOLN
INTRAMUSCULAR | Status: DC | PRN
  Administered 2022-10-04 (×2): 100 ug via INTRAVENOUS

## 2022-10-04 MED ORDER — INDOCYANINE GREEN 25 MG IV SOLR
1.2500 mg | Freq: Once | INTRAVENOUS | Status: DC
  Filled 2022-10-04: qty 10

## 2022-10-04 MED ORDER — ONDANSETRON HCL 4 MG/2ML IJ SOLN
INTRAMUSCULAR | Status: DC | PRN
  Administered 2022-10-04: 4 mg via INTRAVENOUS

## 2022-10-04 MED ORDER — 0.9 % SODIUM CHLORIDE (POUR BTL) OPTIME
TOPICAL | Status: DC | PRN
  Administered 2022-10-04: 1000 mL

## 2022-10-04 MED ORDER — HEPARIN (PORCINE) 25000 UT/250ML-% IV SOLN
1200.0000 [IU]/h | INTRAVENOUS | Status: DC
  Administered 2022-10-04: 1050 [IU]/h via INTRAVENOUS
  Filled 2022-10-04: qty 250

## 2022-10-04 MED ORDER — MIDAZOLAM HCL 2 MG/2ML IJ SOLN
INTRAMUSCULAR | Status: AC
  Filled 2022-10-04: qty 2

## 2022-10-04 MED ORDER — GUAIFENESIN 100 MG/5ML PO LIQD
5.0000 mL | ORAL | Status: DC | PRN
  Administered 2022-10-04 – 2022-10-06 (×3): 5 mL via ORAL
  Filled 2022-10-04 (×3): qty 15

## 2022-10-04 MED ORDER — ACETAMINOPHEN 10 MG/ML IV SOLN: 1000.0000 mg | Freq: Once | INTRAVENOUS | Status: DC | PRN

## 2022-10-04 SURGICAL SUPPLY — 51 items
ADH SKN CLS APL DERMABOND .7 (GAUZE/BANDAGES/DRESSINGS) ×1
APL PRP STRL LF DISP 70% ISPRP (MISCELLANEOUS) ×1
APL SKNCLS STERI-STRIP NONHPOA (GAUZE/BANDAGES/DRESSINGS) ×1
APPLIER CLIP ROT 10 11.4 M/L (STAPLE) ×1
APR CLP MED LRG 11.4X10 (STAPLE) ×1
BAG COUNTER SPONGE SURGICOUNT (BAG) ×1 IMPLANT
BAG SPEC RTRVL 10 TROC 200 (ENDOMECHANICALS) ×1
BAG SPNG CNTER NS LX DISP (BAG) ×1
BENZOIN TINCTURE PRP APPL 2/3 (GAUZE/BANDAGES/DRESSINGS) ×1 IMPLANT
BLADE CLIPPER SURG (BLADE) IMPLANT
CANISTER SUCT 3000ML PPV (MISCELLANEOUS) ×1 IMPLANT
CHLORAPREP W/TINT 26 (MISCELLANEOUS) ×1 IMPLANT
CLIP APPLIE ROT 10 11.4 M/L (STAPLE) ×1 IMPLANT
COVER MAYO STAND STRL (DRAPES) ×1 IMPLANT
COVER SURGICAL LIGHT HANDLE (MISCELLANEOUS) ×1 IMPLANT
DERMABOND ADVANCED .7 DNX12 (GAUZE/BANDAGES/DRESSINGS) IMPLANT
DRAPE C-ARM 42X120 X-RAY (DRAPES) ×1 IMPLANT
DRSG IV TEGADERM 3.5X4.5 STRL (GAUZE/BANDAGES/DRESSINGS) IMPLANT
DRSG TEGADERM 2-3/8X2-3/4 SM (GAUZE/BANDAGES/DRESSINGS) ×3 IMPLANT
DRSG TEGADERM 4X4.75 (GAUZE/BANDAGES/DRESSINGS) ×1 IMPLANT
ELECT REM PT RETURN 9FT ADLT (ELECTROSURGICAL) ×1
ELECTRODE REM PT RTRN 9FT ADLT (ELECTROSURGICAL) ×1 IMPLANT
GAUZE SPONGE 2X2 8PLY STRL LF (GAUZE/BANDAGES/DRESSINGS) ×1 IMPLANT
GLOVE BIO SURGEON STRL SZ7 (GLOVE) ×1 IMPLANT
GLOVE BIOGEL PI IND STRL 7.5 (GLOVE) ×1 IMPLANT
GOWN STRL REUS W/ TWL LRG LVL3 (GOWN DISPOSABLE) ×3 IMPLANT
GOWN STRL REUS W/TWL LRG LVL3 (GOWN DISPOSABLE) ×3
IRRIG SUCT STRYKERFLOW 2 WTIP (MISCELLANEOUS) ×1
IRRIGATION SUCT STRKRFLW 2 WTP (MISCELLANEOUS) ×1 IMPLANT
KIT BASIN OR (CUSTOM PROCEDURE TRAY) ×1 IMPLANT
KIT TURNOVER KIT B (KITS) ×1 IMPLANT
NS IRRIG 1000ML POUR BTL (IV SOLUTION) ×1 IMPLANT
PAD ARMBOARD 7.5X6 YLW CONV (MISCELLANEOUS) ×1 IMPLANT
POUCH RETRIEVAL ECOSAC 10 (ENDOMECHANICALS) IMPLANT
SCISSORS LAP 5X35 DISP (ENDOMECHANICALS) ×1 IMPLANT
SET CHOLANGIOGRAPH 5 50 .035 (SET/KITS/TRAYS/PACK) ×1 IMPLANT
SET TUBE SMOKE EVAC HIGH FLOW (TUBING) ×1 IMPLANT
SLEEVE Z-THREAD 5X100MM (TROCAR) ×1 IMPLANT
SPECIMEN JAR SMALL (MISCELLANEOUS) ×1 IMPLANT
STRIP CLOSURE SKIN 1/2X4 (GAUZE/BANDAGES/DRESSINGS) ×1 IMPLANT
SUT MNCRL AB 4-0 PS2 18 (SUTURE) ×1 IMPLANT
SYS BAG RETRIEVAL 10MM (BASKET)
SYSTEM BAG RETRIEVAL 10MM (BASKET) IMPLANT
TOWEL GREEN STERILE (TOWEL DISPOSABLE) ×1 IMPLANT
TOWEL GREEN STERILE FF (TOWEL DISPOSABLE) ×1 IMPLANT
TRAY LAPAROSCOPIC MC (CUSTOM PROCEDURE TRAY) ×1 IMPLANT
TROCAR 11X100 Z THREAD (TROCAR) ×1 IMPLANT
TROCAR BALLN 12MMX100 BLUNT (TROCAR) ×1 IMPLANT
TROCAR Z-THREAD OPTICAL 5X100M (TROCAR) ×1 IMPLANT
WARMER LAPAROSCOPE (MISCELLANEOUS) ×1 IMPLANT
WATER STERILE IRR 1000ML POUR (IV SOLUTION) ×1 IMPLANT

## 2022-10-04 NOTE — Discharge Instructions (Signed)
[facility]  Please arrive at least 30 min before your appointment to complete your check in paperwork.  If you are unable to arrive 30 min prior to your appointment time we may have to cancel or reschedule you. LAPAROSCOPIC SURGERY: POST OP INSTRUCTIONS Always review your discharge instruction sheet given to you by the facility where your surgery was performed. IF YOU HAVE DISABILITY OR FAMILY LEAVE FORMS, YOU MUST BRING THEM TO THE OFFICE FOR PROCESSING.   DO NOT GIVE THEM TO YOUR DOCTOR.  PAIN CONTROL  First take acetaminophen (Tylenol) to control your pain after surgery.  Follow directions on package.  Taking acetaminophen (Tylenol) regularly after surgery will help to control your pain and lower the amount of prescription pain medication you may need.  You should not take more than 3,000 mg (3 grams) of acetaminophen (Tylenol) in 24 hours.  You should not take ibuprofen (Advil), aleve, motrin, naprosyn or other NSAIDS as you have a history of stomach surgery and are on blood thinners (such as coumadin, Xarelto, or Eliquis).   A prescription for pain medication may be given to you upon discharge.  Take your pain medication as prescribed, if you still have uncontrolled pain after taking acetaminophen (Tylenol). Use ice packs to help control pain. If you need a refill on your pain medication, please contact your pharmacy.  They will contact our office to request authorization. Prescriptions will not be filled after 5pm or on week-ends.  HOME MEDICATIONS Take your usually prescribed medications unless otherwise directed.  DIET You should follow a light diet the first few days after arrival home.  Be sure to include lots of fluids daily. Avoid fatty, fried foods.   CONSTIPATION It is common to experience some constipation after surgery and if you are taking pain medication.  Increasing fluid intake and taking a stool softener (such as Colace) will usually help or prevent  this problem from occurring.  A mild laxative (Milk of Magnesia or Miralax) should be taken according to package instructions if there are no bowel movements after 48 hours.  WOUND/INCISION CARE Most patients will experience some swelling and bruising in the area of the incisions.  Ice packs will help.  Swelling and bruising can take several days to resolve.  Unless discharge instructions indicate otherwise, follow guidelines below  STERI-STRIPS - you may remove your outer bandages 48 hours after surgery, and you may shower at that time.  You have steri-strips (small skin tapes) in place directly over the incision.  These strips should be left on the skin for 7-10 days.   DERMABOND/SKIN GLUE - you may shower in 24 hours.  The glue will flake off over the next 2-3 weeks. Any sutures or staples will be removed at the office during your follow-up visit.  ACTIVITIES You may resume regular (light) daily activities beginning the next day--such as daily self-care, walking, climbing stairs--gradually increasing activities as tolerated.  You may have sexual intercourse when it is comfortable.  Refrain from any heavy lifting or straining until approved by your doctor. You may drive when you are no longer taking prescription pain medication, you can comfortably wear a seatbelt, and you can safely maneuver your car and apply brakes.  FOLLOW-UP You should see your doctor in the office for a follow-up appointment approximately 2-3 weeks after your surgery.  You should have been given your post-op/follow-up appointment when your surgery was scheduled.  If you did not receive a post-op/follow-up appointment, make sure that  you call for this appointment within a day or two after you arrive home to insure a convenient appointment time.   WHEN TO CALL YOUR DOCTOR: Fever over 101.0 Inability to urinate Continued bleeding from incision. Increased pain, redness, or drainage from the incision. Increasing abdominal  pain  The clinic staff is available to answer your questions during regular business hours.  Please don't hesitate to call and ask to speak to one of the nurses for clinical concerns.  If you have a medical emergency, go to the nearest emergency room or call 911.  A surgeon from [facility] is always on call at the hospital. [address], [place], [place]  [address] ? [address] [address], [place], [place]   [address] [phone] ? [phone] ? FAX [phone];a

## 2022-10-04 NOTE — Anesthesia Postprocedure Evaluation (Signed)
Anesthesia Post Note  Patient: [name]  Procedure(s) Performed: LAPAROSCOPIC CHOLECYSTECTOMY WITH INTRAOPERATIVE CHOLANGIOGRAM     Patient location during evaluation: PACU Anesthesia Type: General Level of consciousness: awake Pain management: pain level controlled Vital Signs Assessment: post-procedure vital signs reviewed and stable Respiratory status: spontaneous breathing, nonlabored ventilation and respiratory function stable Cardiovascular status: blood pressure returned to baseline and stable Postop Assessment: no apparent nausea or vomiting Anesthetic complications: no  No notable events documented.  Last Vitals:  Vitals:   10/04/22 1324 10/04/22 1456  BP: 126/74 120/79  Pulse: 82 85  Resp:  16  Temp:  36.7 C  SpO2: 100% 97%    Last Pain:  Vitals:   10/04/22 1735  TempSrc:   PainSc: 2                  Linton Rump

## 2022-10-04 NOTE — Progress Notes (Addendum)
ANTICOAGULATION CONSULT NOTE   Pharmacy Consult for Heparin Indication:  Hx of PE with plan for surgery for symptomatic cholelithiasis likely during admission (hold eliquis with heparin bridge)  Allergies  Allergen Reactions   Bee Venom Anaphylaxis and Rash   Coconut (Cocos Nucifera) Anaphylaxis and Itching   Coconut Fatty Acids Anaphylaxis   Coconut Oil Anaphylaxis and Itching   Food Anaphylaxis and Itching    Walnuts - anaphylaxis, itching   Mixed Ragweed Anaphylaxis, Itching and Rash   Molds & Smuts Anaphylaxis   Mushroom Ext Cmplx(Shiitake-Reishi-Mait) Anaphylaxis   Nutritional Supplements Anaphylaxis and Itching    walnuts   Shellfish Allergy Anaphylaxis   Strawberry Extract Anaphylaxis   Oxycodone Itching   Latex Rash   Norco [Hydrocodone-Acetaminophen] Rash    Patient Measurements: Height: 5\' 6"  (167.6 cm) Weight: 88.9 kg (196 lb) IBW/kg (Calculated) : 59.3 Heparin Dosing Weight: 78.8 kg  Vital Signs: Temp: 98 F (36.7 C) (05/13 1305) Temp Source: Oral (05/13 0958) BP: 126/74 (05/13 1324) Pulse Rate: 82 (05/13 1324)  Labs: Recent Labs    10/02/22 0755 10/02/22 1642 10/03/22 0217 10/03/22 1017 10/03/22 1742 10/04/22 0229  HGB 12.0  --  9.7* 10.9*  --  10.3*  HCT 37.8  --  30.4* 33.2*  --  32.5*  PLT 257  --  191 176  --  222  APTT  --    < > 186* 125* 120* 44*  LABPROT  --   --  15.3*  --   --   --   INR  --   --  1.2  --   --   --   HEPARINUNFRC  --    < > 0.83* 0.78* 0.68 0.23*  CREATININE 1.00  --  0.81  --   --  0.78   < > = values in this interval not displayed.     Estimated Creatinine Clearance: 85 mL/min (by C-G formula based on SCr of 0.78 mg/dL).   Medical History: Past Medical History:  Diagnosis Date   Acute kidney injury (HCC) 10/25/2020   Acute respiratory disease due to COVID-19 virus 10/25/2020   AKI (acute kidney injury) (HCC) 10/25/2020   Allergic rhinitis    Allergic to cats    pet dander   Allergy    Anemia    Anxiety     Arthritis 09/2018   both feet   Asthma    Back pain    Bipolar disorder (HCC)    Cervicalgia    Chronic fatigue syndrome    Chronic low back pain with left-sided sciatica    Class 3 severe obesity with serious comorbidity and body mass index (BMI) of 40.0 to 44.9 in adult [facility]) 11/14/2014   Constipation    Depression    Diabetes mellitus without complication (HCC)    "pre"   Dyspnea    Environmental allergies    Fibromyalgia    GERD (gastroesophageal reflux disease)    Grave's disease    Heart murmur    High cholesterol    History of blood clots    Hypertension    Joint pain    Lactose intolerance    Lower extremity edema    Multiple food allergies    OSA (obstructive sleep apnea)    Osteoporosis    Palpitations    Panic disorder    PE (pulmonary thromboembolism) (HCC)    on Eliquis   Pharyngoesophageal dysphagia 06/24/2014   Prediabetes 05/11/2019   Protein-calorie malnutrition, mild (HCC) 06/23/2021  Pulmonary embolism (HCC) 2024   Rheumatoid arthritis (HCC)    Risk for falls    Sciatica    Severe 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 (HCC)    Sleep apnea    no cpap worn   Subclinical hypothyroidism 05/11/2019   Syncope    Syncope and collapse    Thyroid disease    Vasovagal syncope    Vertigo    Vitamin D deficiency     Medications:  Medications Prior to Admission  Medication Sig Dispense Refill Last Dose   acetaminophen (TYLENOL) 650 MG CR tablet Take 1,300 mg by mouth as needed for pain.   10/01/2022   albuterol (VENTOLIN HFA) 108 (90 Base) MCG/ACT inhaler Inhale 2 puffs into the lungs every 6 (six) hours as needed for wheezing. 18 g 2 Past Week   apixaban (ELIQUIS) 5 MG TABS tablet Take 2 tablets (10 mg total) by mouth 2 (two) times daily for 4 days, THEN 1 tablet (5 mg total) 2 (two) times daily for 21 days. (Patient taking differently: Take 5 mg by mouth once daily ) 58 tablet 0 10/02/2022 at 0800-0900   Ascorbic Acid (VITAMIN C  PO) Take 1 tablet by mouth daily.   10/01/2022   azelastine (ASTELIN) 0.1 % nasal spray Place 2 sprays into both nostrils daily as needed for allergies. Use in each nostril as directed   10/01/2022   BIOTIN PO Take 1 capsule by mouth daily.   10/01/2022   botulinum toxin Type A (BOTOX) 200 units injection Provider to inject 155 units into the muscles of the head and neck every 12 weeks. Discard remainder. 1 each 3 5 weeks   calcium carbonate (TUMS - DOSED IN MG ELEMENTAL CALCIUM) 500 MG chewable tablet Chew 1,000 mg by mouth as needed for indigestion or heartburn.   10/01/2022   cetirizine (ZYRTEC) 10 MG tablet Take 10 mg by mouth daily.   09/30/2022   Cyanocobalamin (VITAMIN B-12 PO) Take 1 capsule by mouth daily.   10/01/2022   diclofenac Sodium (VOLTAREN) 1 % GEL Apply 4 g topically 4 (four) times daily. (Patient taking differently: Apply 1 Application topically as needed (pain).)   09/30/2022   divalproex (DEPAKOTE ER) 500 MG 24 hr tablet Take 2 tablets (1,000 mg total) by mouth at bedtime AND 1 tablet (500 mg total) daily. (Patient taking differently: Take 500 mg by mouth once daily at bedtime ) 270 tablet 0 10/01/2022   EPINEPHrine (EPIPEN 2-PAK) 0.3 mg/0.3 mL IJ SOAJ injection Inject 0.3 mg into the muscle as needed for anaphylaxis.   jan   fluticasone (FLONASE) 50 MCG/ACT nasal spray Place 1 spray into both nostrils as needed (allergies/runny nose). 16 g 0 Past Month   Galcanezumab-gnlm (EMGALITY) 120 MG/ML SOAJ INJECT 120 MG INTO THE SKIN EVERY 30 DAYS (Patient taking differently: Inject 120 mg into the skin every 30 (thirty) days.) 3 mL 1 Past Month   lidocaine (LIDODERM) 5 % Place 1 patch onto the skin daily as needed (pain).   09/26/2022   Melatonin 10 MG CHEW Chew 30 mg by mouth at bedtime as needed (sleep).   09/28/2022   montelukast (SINGULAIR) 10 MG tablet Take 10 mg by mouth at bedtime as needed (allergies).   10/01/2022   Multiple Vitamin (MULTIVITAMIN) tablet Take 1 tablet by mouth in the morning.    10/01/2022   naloxone (NARCAN) nasal spray 4 mg/0.1 mL Place 0.4 mg into the nose once.   unk   NUCYNTA 75 MG  tablet Take 75 mg by mouth 2 (two) times daily as needed for moderate pain or severe pain.   09/30/2022   Rimegepant Sulfate (NURTEC) 75 MG TBDP Take 1 tablet (75 mg total) by mouth daily. (Patient taking differently: Take 75 mg by mouth as needed (migraines).) 8 tablet 0 09/20/2022   tirzepatide (MOUNJARO) 7.5 MG/0.5ML Pen Inject 7.5 mg into the skin once a week.   09/27/2022   doxepin (SINEQUAN) 150 MG capsule Take 150 mg by mouth at bedtime. (Patient not taking: Reported on 10/02/2022)   Not Taking    Scheduled:   acetaminophen  650 mg Oral Q8H   divalproex  500 mg Oral Daily   docusate sodium  100 mg Oral BID   fentaNYL       fentaNYL       loratadine  10 mg Oral Daily   methocarbamol       multivitamin with minerals  1 tablet Oral Daily   traMADol       Infusions:    PRN: albuterol, fentaNYL, fentaNYL, HYDROmorphone (DILAUDID) injection, hydrOXYzine, melatonin, methocarbamol, methocarbamol, montelukast, ondansetron **OR** ondansetron (ZOFRAN) IV, tapentadol, traMADol, traMADol  Assessment: 18 yof with a history of DM, asthma, acid reflux, anxiety, PE on eliquis. Patient is presenting with rt flank pain. Heparin per pharmacy consult placed for  Hx of PE with plan for surgery for symptomatic cholelithiasis likely during admission (hold eliquis with heparin bridge) .  Patient is on apixaban prior to arrival. Last dose 5/11 ~0730 per patient.  Patient s/p lap chole on 5/13. HL of 0.23 and aPTT of 44 collected this AM are not accurate as heparin was turned off around midnight. Per surgery, ok to resume heparin drip 5/13 ~2000 with no bolus.  Goal of Therapy:  Heparin level 0.3-0.7 units/ml aPTT 66-102 seconds Monitor platelets by anticoagulation protocol: Yes   Plan:  Resume heparin 1050 units/hr tonight at 2000 (no bolus) Check aPTT and HL 6 hours after heparin is  resumed F/u ability to transition back to PO anticoagulation  Thank you for allowing pharmacy to be a part of this patient's care.   Rexford Maus, PharmD, BCPS 10/04/2022 1:54 PM

## 2022-10-04 NOTE — Progress Notes (Signed)
  Transition of Care [facility]) Screening Note   Patient Details  Name: [name] Date of Birth: 05-14-1966   Transition of Care [facility]) CM/SW Contact:    Harriet Masson, RN Phone Number: 10/04/2022, 9:16 AM    Transition of Care Department [facility]) has reviewed patient and no TOC needs have been identified at this time. We will continue to monitor patient advancement through interdisciplinary progression rounds. If new patient transition needs arise, please place a TOC consult.  From chart review patient discharged 05/2022 with [facility] health. Plan Laparoscopic cholecystectomy with intraoperative cholangiogram today.

## 2022-10-04 NOTE — Anesthesia Preprocedure Evaluation (Addendum)
Anesthesia Evaluation  Patient identified by MRN, date of birth, ID band Patient awake    Reviewed: Allergy & Precautions, NPO status , Patient's Chart, lab work & pertinent test results  History of Anesthesia Complications Negative for: history of anesthetic complications  Airway Mallampati: III  TM Distance: >3 FB Neck ROM: Full    Dental  (+) Dental Advisory Given   Pulmonary neg shortness of breath, asthma , sleep apnea (does not use CPAP) , neg COPD, neg recent URI, PE (2024)   Pulmonary exam normal breath sounds clear to auscultation       Cardiovascular hypertension, (-) angina (-) Past MI, (-) Cardiac Stents and (-) CABG (-) dysrhythmias + Valvular Problems/Murmurs  Rhythm:Regular Rate:Normal  HLD  TTE 06/02/2022: IMPRESSIONS     1. Left ventricular ejection fraction, by estimation, is 55 to 60%. The  left ventricle has normal function. Left ventricular endocardial border  not optimally defined to evaluate regional wall motion. Left ventricular  diastolic parameters were normal.   2. Right ventricular systolic function is normal. The right ventricular  size is normal. There is normal pulmonary artery systolic pressure. The  estimated right ventricular systolic pressure is 21.7 mmHg.   3. The mitral valve is grossly normal. Trivial mitral valve  regurgitation. No evidence of mitral stenosis.   4. The aortic valve is normal in structure. Aortic valve regurgitation is  trivial. No aortic stenosis is present.   5. The inferior vena cava is normal in size with greater than 50%  respiratory variability, suggesting right atrial pressure of 3 mmHg.     Neuro/Psych  Headaches, neg Seizures PSYCHIATRIC DISORDERS (panic disorder) Anxiety Depression Bipolar Disorder   vertigo  Neuromuscular disease (lumbosacral radiculopathy, sciatica)    GI/Hepatic Neg liver ROS,GERD  ,,Acute cholecystitis, S/p gastric bypass 2008    Endo/Other  diabetes, Type 2Hypothyroidism  Pre-diabetes, Graves' disease  Renal/GU negative Renal ROS     Musculoskeletal  (+) Arthritis , Rheumatoid disorders,  Fibromyalgia -osteoporosis   Abdominal  (+) + obese  Peds  Hematology  (+) Blood dyscrasia, anemia   Anesthesia Other Findings Last Eliquis: 10/01/2022  Last Mounjaro: 09/27/2022  Reproductive/Obstetrics                             Anesthesia Physical Anesthesia Plan  ASA: 3  Anesthesia Plan: General   Post-op Pain Management:    Induction: Intravenous  PONV Risk Score and Plan: 3 and Ondansetron, Dexamethasone and Treatment may vary due to age or medical condition  Airway Management Planned: Oral ETT  Additional Equipment:   Intra-op Plan:   Post-operative Plan: Extubation in OR  Informed Consent: I have reviewed the patients History and Physical, chart, labs and discussed the procedure including the risks, benefits and alternatives for the proposed anesthesia with the patient or authorized representative who has indicated his/her understanding and acceptance.     Dental advisory given  Plan Discussed with: CRNA and Anesthesiologist  Anesthesia Plan Comments: (Risks of general anesthesia discussed including, but not limited to, sore throat, hoarse voice, chipped/damaged teeth, injury to vocal cords, nausea and vomiting, allergic reactions, lung infection, heart attack, stroke, and death. All questions answered. )        Anesthesia Quick Evaluation

## 2022-10-04 NOTE — Progress Notes (Signed)
PROGRESS NOTE    [name]  ZOX:[id number] DOB: 03-29-1966 DOA: 10/02/2022 PCP: Iona Hansen, NP   Brief Narrative:  [name] is a 57 y.o. 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  Assessment & Plan:   Active Problems:   GERD (gastroesophageal reflux disease)   Bipolar disorder (HCC)   Cholecystitis, acute with cholelithiasis  Acute gallstone cholecystitis: General surgery on board.  She remains on Rocephin.  She has no abdominal pain.  No tenderness.  She is scheduled for laparoscopic cholecystectomy with intraoperative cholangiogram today.  Management per general surgery, appreciate their help.  History of PE in January 2024: Eliquis on hold and currently she is on heparin drip.  Will resume Eliquis postoperatively once cleared by general surgery.  History of migraines: Controlled.  Continue home medications.  DVT prophylaxis: Heparin   Code Status: Full Code  Family Communication:  None present at bedside.  Plan of care discussed with patient in length and he/she verbalized understanding and agreed with it.  Status is: Inpatient Remains inpatient appropriate because: Patient will need cholecystectomy.     Estimated body mass index is 31.96 kg/m as calculated from the following:   Height as of this encounter: 5\' 6"  (1.676 m).   Weight as of this encounter: 89.8 kg.    Nutritional Assessment: Body mass index is 31.96 kg/m.Marland Kitchen Seen by dietician.  I agree with the assessment and plan as outlined below: Nutrition Status:        . Skin Assessment: I have examined the patient's skin and I agree with the wound assessment as performed by the wound  care RN as outlined below:    Consultants:  General surgery  Procedures:  None  Antimicrobials:  Anti-infectives (From admission, onward)    Start     Dose/Rate Route Frequency Ordered Stop   10/03/22 1015  cefTRIAXone (ROCEPHIN) 2 g in sodium chloride 0.9 % 100 mL IVPB        2 g 200 mL/hr over 30 Minutes Intravenous Every 24 hours 10/03/22 0925     10/02/22 0945  cefTRIAXone (ROCEPHIN) 2 g in sodium chloride 0.9 % 100 mL IVPB        2 g 200 mL/hr over 30 Minutes Intravenous  Once 10/02/22 8119 10/02/22 1241         Subjective: Patient seen and examined.  She has no complaints but she feels anxious about the procedure.  Objective: Vitals:   10/03/22 1606 10/03/22 2000 10/04/22 0400 10/04/22 0741  BP: 110/71 94/62 99/68  110/67  Pulse: 84 88 80 82  Resp: 17 18 18 18   Temp: 98.7 F (37.1 C) 98 F (36.7 C) 98.4 F (36.9 C) 98.1 F (36.7 C)  TempSrc: Oral Oral Oral Oral  SpO2: 99% 100% 100% 100%  Weight:      Height:        Intake/Output Summary (Last 24 hours) at 10/04/2022 0909 Last data filed at 10/03/2022 2200 Gross per 24 hour  Intake 1199.38 ml  Output --  Net 1199.38 ml    Filed Weights   10/02/22 0747  Weight: 89.8 kg    Examination:  General exam: Appears calm and comfortable  Respiratory system: Clear to  auscultation. Respiratory effort normal. Cardiovascular system: S1 & S2 heard, RRR. No JVD, murmurs, rubs, gallops or clicks. No pedal edema. Gastrointestinal system: Abdomen is nondistended, soft and nontender. No organomegaly or masses felt. Normal bowel sounds heard. Central nervous system: Alert and oriented. No focal neurological deficits. Extremities: Symmetric 5 x 5 power. Skin: No rashes, lesions or ulcers.  Psychiatry: Judgement and insight appear normal. Mood & affect appropriate.   Data Reviewed: I have personally reviewed following labs and imaging studies  CBC: Recent Labs  Lab 10/02/22 0755 10/03/22 0217 10/03/22 1017  10/04/22 0229  WBC 14.8* 7.4 6.1 5.4  HGB 12.0 9.7* 10.9* 10.3*  HCT 37.8 30.4* 33.2* 32.5*  MCV 92.4 89.9 88.8 90.0  PLT 257 191 176 222    Basic Metabolic Panel: Recent Labs  Lab 10/02/22 0755 10/03/22 0217 10/04/22 0229  NA 137 138 140  K 3.8 3.5 4.1  CL 103 104 102  CO2 24 25 28   GLUCOSE 93 93 77  BUN 23* 18 8  CREATININE 1.00 0.81 0.78  CALCIUM 8.6* 7.9* 8.3*    GFR: Estimated Creatinine Clearance: 85.5 mL/min (by C-G formula based on SCr of 0.78 mg/dL). Liver Function Tests: Recent Labs  Lab 10/02/22 0755 10/03/22 0217 10/04/22 0229  AST 18 17 16   ALT 15 12 13   ALKPHOS 81 62 63  BILITOT 0.4 0.1* 0.3  PROT 6.8 5.3* 5.7*  ALBUMIN 3.1* 2.3* 2.5*    Recent Labs  Lab 10/02/22 0755  LIPASE 22    No results for input(s): "AMMONIA" in the last 168 hours. Coagulation Profile: Recent Labs  Lab 10/03/22 0217  INR 1.2    Cardiac Enzymes: No results for input(s): "CKTOTAL", "CKMB", "CKMBINDEX", "TROPONINI" in the last 168 hours. BNP (last 3 results) No results for input(s): "PROBNP" in the last 8760 hours. HbA1C: No results for input(s): "HGBA1C" in the last 72 hours. CBG: No results for input(s): "GLUCAP" in the last 168 hours. Lipid Profile: No results for input(s): "CHOL", "HDL", "LDLCALC", "TRIG", "CHOLHDL", "LDLDIRECT" in the last 72 hours. Thyroid Function Tests: No results for input(s): "TSH", "T4TOTAL", "FREET4", "T3FREE", "THYROIDAB" in the last 72 hours. Anemia Panel: No results for input(s): "VITAMINB12", "FOLATE", "FERRITIN", "TIBC", "IRON", "RETICCTPCT" in the last 72 hours. Sepsis Labs: No results for input(s): "PROCALCITON", "LATICACIDVEN" in the last 168 hours.  No results found for this or any previous visit (from the past 240 hour(s)).   Radiology Studies: No results found.  Scheduled Meds:  acetaminophen  650 mg Oral Q8H   divalproex  500 mg Oral Daily   indocyanine green  1.25 mg Intravenous Once   loratadine  10 mg Oral  Daily   Continuous Infusions:  cefTRIAXone (ROCEPHIN)  IV 2 g (10/03/22 1026)     LOS: 2 days   Colleen Closs, MD [facility]  10/04/2022, 9:09 AM   *Please note that this is a verbal dictation therefore any spelling or grammatical errors are due to the "Dragon Medical One" system interpretation.  Please page via Amion and do not message via secure chat for urgent patient care matters. Secure chat can be used for non urgent patient care matters.  How to contact the [facility] Attending or Consulting provider 7A - 7P or covering provider during after hours 7P -7A, for this patient?  Check the care team in [facility] and look for a) attending/consulting TR[facility] provider listed and b) the [facility] team listed. Page or secure chat 7A-7P. Log into [email or web address] and use [place]'s universal  password to access. If you do not have the password, please contact the hospital operator. Locate the [facility] provider you are looking for under [facility] and page to a number that you can be directly reached. If you still have difficulty reaching the provider, please page the [facility] (Director on Call) for the Hospitalists listed on amion for assistance.

## 2022-10-04 NOTE — Progress Notes (Signed)
Day of Surgery   Subjective/Chief Complaint: No abdominal tenderness today. Hungry   Objective: Vital signs in last 24 hours: Temp:  [97.7 F (36.5 C)-98.7 F (37.1 C)] 98.1 F (36.7 C) (05/13 0741) Pulse Rate:  [80-88] 82 (05/13 0741) Resp:  [17-18] 18 (05/13 0741) BP: (94-110)/(62-71) 110/67 (05/13 0741) SpO2:  [99 %-100 %] 100 % (05/13 0741) Last BM Date : 10/03/22  Intake/Output from previous day: 05/12 0701 - 05/13 0700 In: 1199.4 [P.O.:840; I.V.:259.4; IV Piggyback:100] Out: -  Intake/Output this shift: No intake/output data recorded.  Gen:  Alert, NAD, pleasant Abd: Soft, ND, RUQ ttp, no rigidity or guarding, +BS Psych: A&Ox3   Lab Results:  Recent Labs    10/03/22 1017 10/04/22 0229  WBC 6.1 5.4  HGB 10.9* 10.3*  HCT 33.2* 32.5*  PLT 176 222   BMET Recent Labs    10/03/22 0217 10/04/22 0229  NA 138 140  K 3.5 4.1  CL 104 102  CO2 25 28  GLUCOSE 93 77  BUN 18 8  CREATININE 0.81 0.78  CALCIUM 7.9* 8.3*   PT/INR Recent Labs    10/03/22 0217  LABPROT 15.3*  INR 1.2      Latest Ref Rng & Units 10/04/2022    2:29 AM 10/03/2022    2:17 AM 10/02/2022    7:55 AM  Hepatic Function  Total Protein 6.5 - 8.1 g/dL 5.7  5.3  6.8   Albumin 3.5 - 5.0 g/dL 2.5  2.3  3.1   AST 15 - 41 U/L 16  17  18    ALT 0 - 44 U/L 13  12  15    Alk Phosphatase 38 - 126 U/L 63  62  81   Total Bilirubin 0.3 - 1.2 mg/dL 0.3  0.1  0.4      Studies/Results: US Abdomen Limited RUQ (LIVER/GB)  Result Date: 10/02/2022 CLINICAL DATA:  [id number] RUQ abdominal pain [id number] EXAM: ULTRASOUND ABDOMEN LIMITED RIGHT UPPER QUADRANT COMPARISON:  CT 05/25/2022 FINDINGS: Gallbladder: Cholelithiasis with multiple intraluminal gallstones, largest measuring up to 0.9 cm. No wall thickening. Negative sonographic Murphy sign. The gallbladder is non-dilated. Common bile duct: Diameter: 8 mm, mildly dilated. Liver: Mildly increased liver echogenicity. No focal liver lesion. Portal vein is patent on  color Doppler imaging with normal direction of blood flow towards the liver. Other: None. IMPRESSION: Cholelithiasis with multiple intraluminal gallstones, largest measuring 0.9 cm. No evidence of acute cholecystitis. Mildly dilated common bile duct measuring 8 mm. Correlate with LFTs and consider further evaluation with MRCP. Mild hepatic steatosis. Electronically Signed   By: Caprice Renshaw M.D.   On: 10/02/2022 09:07   DG Chest Portable 1 View  Result Date: 10/02/2022 CLINICAL DATA:  Shortness of breath. EXAM: PORTABLE CHEST 1 VIEW COMPARISON:  06/01/2022 FINDINGS: Mild asymmetric elevation left hemidiaphragm. Interstitial markings are diffusely coarsened with chronic features. Similar streaky opacity at the bases suggest atelectasis. No overt edema or focal lung consolidation. No substantial pleural effusion. The cardio pericardial silhouette is enlarged. IMPRESSION: Chronic interstitial coarsening with bibasilar atelectasis. No acute cardiopulmonary findings. Electronically Signed   By: Kennith Center M.D.   On: 10/02/2022 08:48    Anti-infectives: Anti-infectives (From admission, onward)    Start     Dose/Rate Route Frequency Ordered Stop   10/03/22 1015  cefTRIAXone (ROCEPHIN) 2 g in sodium chloride 0.9 % 100 mL IVPB        2 g 200 mL/hr over 30 Minutes Intravenous Every 24 hours 10/03/22 0925  10/02/22 0945  cefTRIAXone (ROCEPHIN) 2 g in sodium chloride 0.9 % 100 mL IVPB        2 g 200 mL/hr over 30 Minutes Intravenous  Once 10/02/22 0942 10/02/22 1241       Assessment/Plan: Acute Cholecystitis  - Cont abx - CBD 8mm on [name], no obvious Choledocholithiasis, LFT's wnl. Repeat in AM - Hold heparin at midnight. Last dose Eliquis, 5/11 am - Laparoscopic cholecystectomy with intraoperative cholangiogram today.  The surgical procedure has been discussed with the patient.  Potential risks, benefits, alternative treatments, and expected outcomes have been explained.  All of the patient's  questions at this time have been answered.  The likelihood of reaching the patient's treatment goal is good.  The patient understand the proposed surgical procedure and wishes to proceed.     FEN - NPO for surgery.  IVF VTE - SCDs, heparin gtt (hold at midnight) ID - Rocephin   Hx PE on Eliquis  Hx gastric bypass    LOS: 2 days    Wynona Luna 10/04/2022

## 2022-10-04 NOTE — Op Note (Signed)
Laparoscopic Cholecystectomy with IOC/ ICG dye Procedure Note  Indications: This patient presents with symptomatic gallbladder disease and will undergo laparoscopic cholecystectomy.  She has had a previous   Pre-operative Diagnosis: Calculus of gallbladder with acute cholecystitis, without mention of obstruction  Post-operative Diagnosis: Same  Surgeon: Wynona Luna   Assistants: none  Anesthesia: General endotracheal anesthesia  ASA Class: 2  Procedure Details  The patient was seen again in the Holding Room. The risks, benefits, complications, treatment options, and expected outcomes were discussed with the patient. The possibilities of reaction to medication, pulmonary aspiration, perforation of viscus, bleeding, recurrent infection, finding a normal gallbladder, the need for additional procedures, failure to diagnose a condition, the possible need to convert to an open procedure, and creating a complication requiring transfusion or operation were discussed with the patient. The likelihood of improving the patient's symptoms with return to their baseline status is good.  The patient and/or family concurred with the proposed plan, giving informed consent. The site of surgery properly noted. The patient was taken to Operating Room, identified as [name] and the procedure verified as Laparoscopic Cholecystectomy with Intraoperative Cholangiogram. A Time Out was held and the above information confirmed.  Prior to the induction of general anesthesia, antibiotic prophylaxis was administered. General endotracheal anesthesia was then administered and tolerated well. After the induction, the abdomen was prepped with Chloraprep and draped in the sterile fashion. The patient was positioned in the supine position.  Local anesthetic agent was injected into the skin below the umbilicus and an incision made. We dissected down to the abdominal fascia with blunt dissection.  The fascia was incised  vertically and we entered the peritoneal cavity bluntly.  A pursestring suture of 0-Vicryl was placed around the fascial opening.  The Hasson cannula was inserted and secured with the stay suture.  Pneumoperitoneum was then created with CO2 and tolerated well without any adverse changes in the patient's vital signs. An 11-mm port was placed in the subxiphoid position.  Two 5-mm ports were placed in the right upper quadrant. All skin incisions were infiltrated with a local anesthetic agent before making the incision and placing the trocars.   We positioned the patient in reverse Trendelenburg, tilted slightly to the patient's left.  The gallbladder was identified, the fundus grasped and retracted cephalad.  The gallbladder is quite distended with significant omental adhesions.  There was no sign of active infection. Adhesions were lysed bluntly and with the electrocautery where indicated, taking care not to injure any adjacent organs or viscus. The infundibulum was grasped and retracted laterally, exposing the peritoneum overlying the triangle of Calot. This was then divided and exposed in a blunt fashion. A critical view of the cystic duct and cystic artery was obtained.  The cystic duct was clearly identified and bluntly dissected circumferentially. The cystic duct was ligated with a clip distally.   An incision was made in the cystic duct and the [facility] cholangiogram catheter introduced. The catheter was secured using a clip. A cholangiogram was then obtained which showed good visualization of the distal and proximal biliary tree with no sign of obstruction.  Contrast flowed easily into the duodenum. However, there were some floating filling defects in the mid-common bile duct.  These may be air bubbles. The catheter was then removed.   The cystic duct was then ligated with clips and divided. The cystic artery was identified, dissected free, ligated with clips and divided as well.   The gallbladder was  dissected  from the liver bed in retrograde fashion with the electrocautery. The gallbladder was removed and placed in an Eco sac. The liver bed was irrigated and inspected. Hemostasis was achieved with the electrocautery. Copious irrigation was utilized and was repeatedly aspirated until clear.  The gallbladder and Eco sac were then removed through the umbilical port site.  The pursestring suture was used to close the umbilical fascia.    We again inspected the right upper quadrant for hemostasis.  Pneumoperitoneum was released as we removed the trocars.  4-0 Monocryl was used to close the skin.   Benzoin, steri-strips, and clean dressings were applied. The patient was then extubated and brought to the recovery room in stable condition. Instrument, sponge, and needle counts were correct at closure and at the conclusion of the case.   Findings: Cholecystitis with Cholelithiasis  Estimated Blood Loss: Minimal         Drains: none         Specimens: Gallbladder           Complications: None; patient tolerated the procedure well.         Disposition: PACU - hemodynamically stable.         Condition: stable  Wilmon Arms. Corliss Skains, MD, [facility]  General Surgery   10/04/2022 12:01 PM

## 2022-10-04 NOTE — Transfer of Care (Signed)
Immediate Anesthesia Transfer of Care Note  Patient: [name]  Procedure(s) Performed: LAPAROSCOPIC CHOLECYSTECTOMY WITH INTRAOPERATIVE CHOLANGIOGRAM  Patient Location: PACU  Anesthesia Type:General  Level of Consciousness: drowsy  Airway & Oxygen Therapy: Patient Spontanous Breathing  Post-op Assessment: Report given to RN and Post -op Vital signs reviewed and stable  Post vital signs: Reviewed and stable  Last Vitals:  Vitals Value Taken Time  BP 94/72 10/04/22 1215  Temp    Pulse 81 10/04/22 1215  Resp 13 10/04/22 1215  SpO2 100 % 10/04/22 1215  Vitals shown include unvalidated device data.  Last Pain:  Vitals:   10/04/22 0958  TempSrc: Oral  PainSc: 0-No pain         Complications: No notable events documented.

## 2022-10-04 NOTE — Anesthesia Procedure Notes (Signed)
Procedure Name: Intubation Date/Time: 10/04/2022 11:06 AM  Performed by: Garfield Cornea, CRNAPre-anesthesia Checklist: Patient identified, Emergency Drugs available, Suction available and Patient being monitored Patient Re-evaluated:Patient Re-evaluated prior to induction Oxygen Delivery Method: Circle System Utilized Preoxygenation: Pre-oxygenation with 100% oxygen Induction Type: IV induction Ventilation: Mask ventilation without difficulty Laryngoscope Size: Mac and 3 Grade View: Grade I Tube type: Oral Tube size: 7.0 mm Number of attempts: 1 Airway Equipment and Method: Stylet Placement Confirmation: ETT inserted through vocal cords under direct vision, positive ETCO2 and breath sounds checked- equal and bilateral Secured at: 22 cm Tube secured with: Tape Dental Injury: Teeth and Oropharynx as per pre-operative assessment

## 2022-10-05 ENCOUNTER — Inpatient Hospital Stay (HOSPITAL_COMMUNITY): Payer: 59

## 2022-10-05 ENCOUNTER — Encounter (HOSPITAL_COMMUNITY): Payer: Self-pay | Admitting: Surgery

## 2022-10-05 ENCOUNTER — Telehealth: Payer: Self-pay | Admitting: Adult Health

## 2022-10-05 DIAGNOSIS — G43711 Chronic migraine without aura, intractable, with status migrainosus: Secondary | ICD-10-CM

## 2022-10-05 DIAGNOSIS — K810 Acute cholecystitis: Secondary | ICD-10-CM | POA: Diagnosis not present

## 2022-10-05 LAB — CBC WITH DIFFERENTIAL/PLATELET
Abs Immature Granulocytes: 0.06 10*3/uL (ref 0.00–0.07)
Basophils Absolute: 0 10*3/uL (ref 0.0–0.1)
Basophils Relative: 0 %
Eosinophils Absolute: 0 10*3/uL (ref 0.0–0.5)
Eosinophils Relative: 0 %
HCT: 31.4 % — ABNORMAL LOW (ref 36.0–46.0)
Hemoglobin: 10 g/dL — ABNORMAL LOW (ref 12.0–15.0)
Immature Granulocytes: 1 %
Lymphocytes Relative: 8 %
Lymphs Abs: 0.9 10*3/uL (ref 0.7–4.0)
MCH: 28.5 pg (ref 26.0–34.0)
MCHC: 31.8 g/dL (ref 30.0–36.0)
MCV: 89.5 fL (ref 80.0–100.0)
Monocytes Absolute: 1.6 10*3/uL — ABNORMAL HIGH (ref 0.1–1.0)
Monocytes Relative: 15 %
Neutro Abs: 8.3 10*3/uL — ABNORMAL HIGH (ref 1.7–7.7)
Neutrophils Relative %: 76 %
Platelets: 219 10*3/uL (ref 150–400)
RBC: 3.51 MIL/uL — ABNORMAL LOW (ref 3.87–5.11)
RDW: 14.1 % (ref 11.5–15.5)
WBC: 10.8 10*3/uL — ABNORMAL HIGH (ref 4.0–10.5)
nRBC: 0 % (ref 0.0–0.2)

## 2022-10-05 LAB — COMPREHENSIVE METABOLIC PANEL
ALT: 24 U/L (ref 0–44)
AST: 35 U/L (ref 15–41)
Albumin: 2.5 g/dL — ABNORMAL LOW (ref 3.5–5.0)
Alkaline Phosphatase: 64 U/L (ref 38–126)
Anion gap: 12 (ref 5–15)
BUN: 9 mg/dL (ref 6–20)
CO2: 26 mmol/L (ref 22–32)
Calcium: 8.5 mg/dL — ABNORMAL LOW (ref 8.9–10.3)
Chloride: 102 mmol/L (ref 98–111)
Creatinine, Ser: 0.79 mg/dL (ref 0.44–1.00)
GFR, Estimated: >60 mL/min (ref >60)
Glucose, Bld: 115 mg/dL — ABNORMAL HIGH (ref 70–99)
Potassium: 4.1 mmol/L (ref 3.5–5.1)
Sodium: 140 mmol/L (ref 135–145)
Total Bilirubin: 0.2 mg/dL — ABNORMAL LOW (ref 0.3–1.2)
Total Protein: 5.7 g/dL — ABNORMAL LOW (ref 6.5–8.1)

## 2022-10-05 LAB — APTT: aPTT: 51 seconds — ABNORMAL HIGH (ref 24–36)

## 2022-10-05 LAB — HEPARIN LEVEL (UNFRACTIONATED): Heparin Unfractionated: 0.3 IU/mL (ref 0.30–0.70)

## 2022-10-05 MED ORDER — POLYETHYLENE GLYCOL 3350 17 G PO PACK: 17.0000 g | PACK | Freq: Every day | ORAL | Status: DC | PRN

## 2022-10-05 MED ORDER — ACETAMINOPHEN 500 MG PO TABS
1000.0000 mg | ORAL_TABLET | Freq: Four times a day (QID) | ORAL | Status: DC
  Administered 2022-10-05 – 2022-10-06 (×5): 1000 mg via ORAL
  Filled 2022-10-05 (×5): qty 2

## 2022-10-05 MED ORDER — BOTOX 200 UNITS IJ SOLR
INTRAMUSCULAR | 3 refills | Status: DC
Start: 2022-10-05 — End: 2023-10-13

## 2022-10-05 MED ORDER — OXYCODONE HCL 5 MG PO TABS
5.0000 mg | ORAL_TABLET | ORAL | Status: DC | PRN
  Administered 2022-10-05 – 2022-10-06 (×5): 10 mg via ORAL
  Filled 2022-10-05 (×5): qty 2

## 2022-10-05 MED ORDER — SIMETHICONE 80 MG PO CHEW
80.0000 mg | CHEWABLE_TABLET | Freq: Four times a day (QID) | ORAL | Status: DC | PRN
  Administered 2022-10-05 – 2022-10-06 (×3): 80 mg via ORAL
  Filled 2022-10-05 (×3): qty 1

## 2022-10-05 MED ORDER — METHOCARBAMOL 500 MG PO TABS
1000.0000 mg | ORAL_TABLET | Freq: Three times a day (TID) | ORAL | Status: DC
  Administered 2022-10-05 – 2022-10-06 (×4): 1000 mg via ORAL
  Filled 2022-10-05 (×4): qty 2

## 2022-10-05 MED ORDER — MELATONIN 5 MG PO TABS
10.0000 mg | ORAL_TABLET | Freq: Every evening | ORAL | Status: DC | PRN
  Administered 2022-10-06: 10 mg via ORAL
  Filled 2022-10-05: qty 2

## 2022-10-05 MED ORDER — APIXABAN 5 MG PO TABS
5.0000 mg | ORAL_TABLET | Freq: Two times a day (BID) | ORAL | Status: DC
  Administered 2022-10-05 – 2022-10-06 (×3): 5 mg via ORAL
  Filled 2022-10-05 (×3): qty 1

## 2022-10-05 MED ORDER — DIPHENHYDRAMINE HCL 25 MG PO CAPS
25.0000 mg | ORAL_CAPSULE | Freq: Four times a day (QID) | ORAL | Status: DC | PRN
  Administered 2022-10-05 – 2022-10-06 (×4): 25 mg via ORAL
  Filled 2022-10-05 (×4): qty 1

## 2022-10-05 NOTE — Progress Notes (Signed)
ANTICOAGULATION CONSULT NOTE - Follow Up Consult  Pharmacy Consult for heparin Indication:  h/o VTE  Labs: Recent Labs    10/03/22 0217 10/03/22 1017 10/03/22 1742 10/04/22 0229 10/05/22 0332  HGB 9.7* 10.9*  --  10.3* 10.0*  HCT 30.4* 33.2*  --  32.5* 31.4*  PLT 191 176  --  222 219  APTT 186* 125* 120* 44* 51*  LABPROT 15.3*  --   --   --   --   INR 1.2  --   --   --   --   HEPARINUNFRC 0.83* 0.78* 0.68 0.23* 0.30  CREATININE 0.81  --   --  0.78 0.79    Assessment: 57yo female subtherapeutic on heparin after resuming post-op; no infusion issues or signs of bleeding per RN.  Goal of Therapy:  Heparin level 0.3-0.7 units/ml aPTT 66-102 seconds   Plan:  Increase heparin infusion by 2 units/kg/hr to 1200 units/hr. Check labs in 6 hours.   Vernard Gambles, PharmD, BCPS 10/05/2022 5:44 AM

## 2022-10-05 NOTE — Progress Notes (Signed)
ANTICOAGULATION CONSULT NOTE   Pharmacy Consult for Heparin >> PTA apixaban Indication:  Hx of PE  Allergies  Allergen Reactions   Bee Venom Anaphylaxis and Rash   Coconut (Cocos Nucifera) Anaphylaxis and Itching   Coconut Fatty Acids Anaphylaxis   Coconut Oil Anaphylaxis and Itching   Food Anaphylaxis and Itching    Walnuts - anaphylaxis, itching   Mixed Ragweed Anaphylaxis, Itching and Rash   Molds & Smuts Anaphylaxis   Mushroom Ext Cmplx(Shiitake-Reishi-Mait) Anaphylaxis   Nutritional Supplements Anaphylaxis and Itching    walnuts   Shellfish Allergy Anaphylaxis   Strawberry Extract Anaphylaxis   Oxycodone Itching   Latex Rash   Norco [Hydrocodone-Acetaminophen] Rash    Patient Measurements: Height: 5\' 6"  (167.6 cm) Weight: 88.9 kg (196 lb) IBW/kg (Calculated) : 59.3 Heparin Dosing Weight: 78.8 kg  Vital Signs: Temp: 98.4 F (36.9 C) (05/14 0940) Temp Source: Oral (05/14 0940) BP: 133/81 (05/14 0940) Pulse Rate: 94 (05/14 0940)  Labs: Recent Labs    10/03/22 0217 10/03/22 1017 10/03/22 1742 10/04/22 0229 10/05/22 0332  HGB 9.7* 10.9*  --  10.3* 10.0*  HCT 30.4* 33.2*  --  32.5* 31.4*  PLT 191 176  --  222 219  APTT 186* 125* 120* 44* 51*  LABPROT 15.3*  --   --   --   --   INR 1.2  --   --   --   --   HEPARINUNFRC 0.83* 0.78* 0.68 0.23* 0.30  CREATININE 0.81  --   --  0.78 0.79     Estimated Creatinine Clearance: 85 mL/min (by C-G formula based on SCr of 0.79 mg/dL).   Medical History: Past Medical History:  Diagnosis Date   Acute kidney injury (HCC) 10/25/2020   Acute respiratory disease due to COVID-19 virus 10/25/2020   AKI (acute kidney injury) (HCC) 10/25/2020   Allergic rhinitis    Allergic to cats    pet dander   Allergy    Anemia    Anxiety    Arthritis 09/2018   both feet   Asthma    Back pain    Bipolar disorder (HCC)    Cervicalgia    Chronic fatigue syndrome    Chronic low back pain with left-sided sciatica    Class 3  severe obesity with serious comorbidity and body mass index (BMI) of 40.0 to 44.9 in adult [facility]) 11/14/2014   Constipation    Depression    Diabetes mellitus without complication (HCC)    "pre"   Dyspnea    Environmental allergies    Fibromyalgia    GERD (gastroesophageal reflux disease)    Grave's disease    Heart murmur    High cholesterol    History of blood clots    Hypertension    Joint pain    Lactose intolerance    Lower extremity edema    Multiple food allergies    OSA (obstructive sleep apnea)    Osteoporosis    Palpitations    Panic disorder    PE (pulmonary thromboembolism) (HCC)    on Eliquis   Pharyngoesophageal dysphagia 06/24/2014   Prediabetes 05/11/2019   Protein-calorie malnutrition, mild (HCC) 06/23/2021   Pulmonary embolism (HCC) 2024   Rheumatoid arthritis (HCC)    Risk for falls    Sciatica    Severe 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 (HCC)    Sleep apnea    no cpap worn   Subclinical hypothyroidism 05/11/2019   Syncope    Syncope  and collapse    Thyroid disease    Vasovagal syncope    Vertigo    Vitamin D deficiency     Medications:  Medications Prior to Admission  Medication Sig Dispense Refill Last Dose   acetaminophen (TYLENOL) 650 MG CR tablet Take 1,300 mg by mouth as needed for pain.   10/01/2022   albuterol (VENTOLIN HFA) 108 (90 Base) MCG/ACT inhaler Inhale 2 puffs into the lungs every 6 (six) hours as needed for wheezing. 18 g 2 Past Week   apixaban (ELIQUIS) 5 MG TABS tablet Take 2 tablets (10 mg total) by mouth 2 (two) times daily for 4 days, THEN 1 tablet (5 mg total) 2 (two) times daily for 21 days. (Patient taking differently: Take 5 mg by mouth once daily ) 58 tablet 0 10/02/2022 at 0800-0900   Ascorbic Acid (VITAMIN C PO) Take 1 tablet by mouth daily.   10/01/2022   azelastine (ASTELIN) 0.1 % nasal spray Place 2 sprays into both nostrils daily as needed for allergies. Use in each nostril as directed    10/01/2022   BIOTIN PO Take 1 capsule by mouth daily.   10/01/2022   botulinum toxin Type A (BOTOX) 200 units injection Provider to inject 155 units into the muscles of the head and neck every 12 weeks. Discard remainder. 1 each 3 5 weeks   calcium carbonate (TUMS - DOSED IN MG ELEMENTAL CALCIUM) 500 MG chewable tablet Chew 1,000 mg by mouth as needed for indigestion or heartburn.   10/01/2022   cetirizine (ZYRTEC) 10 MG tablet Take 10 mg by mouth daily.   09/30/2022   Cyanocobalamin (VITAMIN B-12 PO) Take 1 capsule by mouth daily.   10/01/2022   diclofenac Sodium (VOLTAREN) 1 % GEL Apply 4 g topically 4 (four) times daily. (Patient taking differently: Apply 1 Application topically as needed (pain).)   09/30/2022   divalproex (DEPAKOTE ER) 500 MG 24 hr tablet Take 2 tablets (1,000 mg total) by mouth at bedtime AND 1 tablet (500 mg total) daily. (Patient taking differently: Take 500 mg by mouth once daily at bedtime ) 270 tablet 0 10/01/2022   EPINEPHrine (EPIPEN 2-PAK) 0.3 mg/0.3 mL IJ SOAJ injection Inject 0.3 mg into the muscle as needed for anaphylaxis.   jan   fluticasone (FLONASE) 50 MCG/ACT nasal spray Place 1 spray into both nostrils as needed (allergies/runny nose). 16 g 0 Past Month   Galcanezumab-gnlm (EMGALITY) 120 MG/ML SOAJ INJECT 120 MG INTO THE SKIN EVERY 30 DAYS (Patient taking differently: Inject 120 mg into the skin every 30 (thirty) days.) 3 mL 1 Past Month   lidocaine (LIDODERM) 5 % Place 1 patch onto the skin daily as needed (pain).   09/26/2022   Melatonin 10 MG CHEW Chew 30 mg by mouth at bedtime as needed (sleep).   09/28/2022   montelukast (SINGULAIR) 10 MG tablet Take 10 mg by mouth at bedtime as needed (allergies).   10/01/2022   Multiple Vitamin (MULTIVITAMIN) tablet Take 1 tablet by mouth in the morning.   10/01/2022   naloxone (NARCAN) nasal spray 4 mg/0.1 mL Place 0.4 mg into the nose once.   unk   NUCYNTA 75 MG tablet Take 75 mg by mouth 2 (two) times daily as needed for moderate  pain or severe pain.   09/30/2022   Rimegepant Sulfate (NURTEC) 75 MG TBDP Take 1 tablet (75 mg total) by mouth daily. (Patient taking differently: Take 75 mg by mouth as needed (migraines).) 8 tablet 0 09/20/2022  tirzepatide (MOUNJARO) 7.5 MG/0.5ML Pen Inject 7.5 mg into the skin once a week.   09/27/2022   doxepin (SINEQUAN) 150 MG capsule Take 150 mg by mouth at bedtime. (Patient not taking: Reported on 10/02/2022)   Not Taking    Scheduled:   acetaminophen  1,000 mg Oral Q6H   divalproex  500 mg Oral Daily   docusate sodium  100 mg Oral BID   loratadine  10 mg Oral Daily   methocarbamol  1,000 mg Oral TID   multivitamin with minerals  1 tablet Oral Daily   Infusions:   heparin 1,200 Units/hr (10/05/22 1610)    PRN: albuterol, diphenhydrAMINE, guaiFENesin, HYDROmorphone (DILAUDID) injection, hydrOXYzine, montelukast, ondansetron **OR** ondansetron (ZOFRAN) IV, oxyCODONE, polyethylene glycol, simethicone, tapentadol  Assessment: 48 yof with a history of DM, asthma, acid reflux, anxiety, PE on apixaban. Patient is presenting with rt flank pain. Patient is s/p lap chole on 5/13 - pharmacy consulted to transition back to PTA apixaban.  Goal of Therapy:  Monitor platelets by anticoagulation protocol: Yes   Plan:  Resume apixaban 5mg  PO BID Discontinue IV heparin at time of apixaban administration  Thank you for allowing pharmacy to be a part of this patient's care.   Rexford Maus, PharmD, BCPS 10/05/2022 12:31 PM

## 2022-10-05 NOTE — Addendum Note (Signed)
Addended by: Bertram Savin on: 10/05/2022 05:02 PM   Modules accepted: Orders

## 2022-10-05 NOTE — Progress Notes (Signed)
1 Day Post-Op  Subjective: CC: Patient reports she doesn't feel well today.   Having incisional pain worse with movement that is not well controlled with Ultram. Reports has tolerated oxy in the past but will get itching sometimes and takes benadryl prn with this. Denies facial swelling, rash, sob etc w/ oxycodone. Doesn't tolerated hydrocodone. Talked with pharmacy about options. Will proceed with oxycodone.   She tolerated cld yesterday but doesn't have appetite this am for reg diet 2/2 pain. No n/v. No flatus or bm since surgery.   Having R posterior shoulder pain and bloating. Trying to get up and walk around some but only has been able to a little in the room. Asked mobility tech if they could see - they do not have coverage on 5C. Will get PT to see.   She is having some sob when she lies down. Also some R sided chest pain that is non-exertional. No IS in the room. I brought one to the room and showed her how to use this - pulling 750. On RA.   Voiding without issues.   Objective: Vital signs in last 24 hours: Temp:  [98 F (36.7 C)-98.4 F (36.9 C)] 98.4 F (36.9 C) (05/14 0940) Pulse Rate:  [80-94] 94 (05/14 0940) Resp:  [11-20] 20 (05/14 0940) BP: (94-133)/(72-83) 133/81 (05/14 0940) SpO2:  [95 %-100 %] 95 % (05/14 0940) Last BM Date : 10/01/22  Intake/Output from previous day: 05/13 0701 - 05/14 0700 In: 1309.2 [P.O.:480; I.V.:829.2] Out: -  Intake/Output this shift: No intake/output data recorded.  PE: Gen:  Alert, NAD, pleasant Card:  Reg Pulm:  CTAB, no W/R/R, effort normal. On RA. 750 on IS.  Abd: Soft, mild distension, tender around laparoscopic incisions, no rigidity or guarding. +BS. Umbilical incision with some dried blood on gauze with overlying tegaderm but no signs of active drainage. Other laparoscopic incisions with gauze and tegaderm in place, cdi Ext:  No LE edema or calf tenderness Psych: A&Ox3   Lab Results:  Recent Labs    10/04/22 0229  10/05/22 0332  WBC 5.4 10.8*  HGB 10.3* 10.0*  HCT 32.5* 31.4*  PLT 222 219   BMET Recent Labs    10/04/22 0229 10/05/22 0332  NA 140 140  K 4.1 4.1  CL 102 102  CO2 28 26  GLUCOSE 77 115*  BUN 8 9  CREATININE 0.78 0.79  CALCIUM 8.3* 8.5*   PT/INR Recent Labs    10/03/22 0217  LABPROT 15.3*  INR 1.2   CMP     Component Value Date/Time   NA 140 10/05/2022 0332   NA 143 07/09/2020 1536   K 4.1 10/05/2022 0332   CL 102 10/05/2022 0332   CO2 26 10/05/2022 0332   GLUCOSE 115 (H) 10/05/2022 0332   BUN 9 10/05/2022 0332   BUN 19 07/09/2020 1536   CREATININE 0.79 10/05/2022 0332   CREATININE 0.96 08/03/2021 1352   CREATININE 0.85 06/12/2014 2031   CALCIUM 8.5 (L) 10/05/2022 0332   PROT 5.7 (L) 10/05/2022 0332   PROT 7.0 07/09/2020 1536   ALBUMIN 2.5 (L) 10/05/2022 0332   ALBUMIN 4.2 07/09/2020 1536   AST 35 10/05/2022 0332   AST 12 (L) 08/03/2021 1352   ALT 24 10/05/2022 0332   ALT 14 08/03/2021 1352   ALKPHOS 64 10/05/2022 0332   BILITOT 0.2 (L) 10/05/2022 0332   BILITOT 0.3 08/03/2021 1352   GFRNONAA >60 10/05/2022 0332   GFRNONAA >60 08/03/2021 1352  GFRAA 95 07/09/2020 1536   Lipase     Component Value Date/Time   LIPASE 22 10/02/2022 0755    Studies/Results: DG Cholangiogram Operative  Result Date: 10/04/2022 CLINICAL DATA:  Intraoperative cholangiogram during laparoscopic cholecystectomy. EXAM: INTRAOPERATIVE CHOLANGIOGRAM FLUOROSCOPY TIME:  15 seconds (4.4 mGy) COMPARISON:  Right upper quadrant abdominal ultrasound-10/02/2022 FINDINGS: Intraoperative cholangiographic images of the right upper abdominal quadrant during laparoscopic cholecystectomy are provided for review. Surgical clips overlie the expected location of the gallbladder fossa. Contrast injection demonstrates selective cannulation of the central aspect of the cystic duct. There is passage of contrast through the central aspect of the cystic duct with filling of a non dilated common bile  duct. There is passage of contrast though the CBD and into the descending portion of the duodenum. There is minimal reflux of injected contrast into the common hepatic duct and central aspect of the non dilated intrahepatic biliary system. Nonocclusive filling defects are seen within the common hepatic duct, favored to represent air bubbles though conceivably choledocholithiasis could have a similar appearance. IMPRESSION: Nonocclusive filling defects the common duct, favored to represent air bubbles though conceivably choledocholithiasis could have a similar appearance. Correlation with the operative report is advised. Electronically Signed   By: Simonne Come M.D.   On: 10/04/2022 12:32    Anti-infectives: Anti-infectives (From admission, onward)    Start     Dose/Rate Route Frequency Ordered Stop   10/03/22 1015  cefTRIAXone (ROCEPHIN) 2 g in sodium chloride 0.9 % 100 mL IVPB  Status:  Discontinued        2 g 200 mL/hr over 30 Minutes Intravenous Every 24 hours 10/03/22 0925 10/04/22 1327   10/02/22 0945  cefTRIAXone (ROCEPHIN) 2 g in sodium chloride 0.9 % 100 mL IVPB        2 g 200 mL/hr over 30 Minutes Intravenous  Once 10/02/22 1610 10/02/22 1241        Assessment/Plan POD 1 s/p Laparoscopic Cholecystectomy w/ IOC for Acute Cholecystitis by Dr. Corliss Skains - 10/04/22 - IOC w/ non-occlusive filling defects of CBD, favored to represent air bubbles. LFT's post op wnl - Will adjust pain meds as above - Mobilize, PT ordered - Pulm toilet, IS  FEN - Reg diet. Simethicone. Bowel regimen. IVF per TRH VTE - SCDs, on heparin gtt ID - Rocephin 5/11 - 5/13. Afebrile.  Foley - None, voiding.   CP/SOB - CXR now. IS 10x/hour. Defer further w/u to primary.  Hx PE on Eliquis - on heparin gtt currently.  Hx gastric bypass - cont multivitamin. Avoid NSAIDs   LOS: 3 days    Jacinto Halim , [facility] 10/05/2022, 10:34 AM Please see Amion for pager number during day hours  7:00am-4:30pm

## 2022-10-05 NOTE — Progress Notes (Signed)
PROGRESS NOTE    [name]  JYN:[id number] DOB: [birth year] DOA: 10/02/2022 PCP: Iona Hansen, NP   Brief Narrative:  [name] is a 57 y.o. 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  Underwent Laparoscopic Cholecystectomy w/ [facility] 10/04/2022.  Assessment & Plan:   Active Problems:   GERD (gastroesophageal reflux disease)   Bipolar disorder (HCC)   Cholecystitis, acute with cholelithiasis  Acute gallstone cholecystitis: General surgery on board.  S/p laparoscopic cholecystectomy with IOC on 10/04/2022.  She is complaining of worsening abdominal pain compared to yesterday.  Does not feel comfortable going home yet.  Seen by general surgery, pain medications are being adjusted by them.  Will keep for observation overnight per their recommendations.  Diet per them.  History of PE in January 2024: Eliquis on hold and currently she is on heparin drip which is resumed postoperatively.  Will resume Eliquis postoperatively once cleared by general surgery.  History of migraines: Controlled.  Continue home medications.  DVT prophylaxis: Heparin   Code Status: Full Code  Family Communication:  None present at bedside.  Plan of care discussed with patient in length and he/she verbalized understanding and agreed with it.  Status is: Inpatient Remains inpatient appropriate because: Patient with pain.  Surgery recommends keeping overnight   Estimated body mass index is 31.64 kg/m as calculated from the following:   Height as of this encounter: 5\' 6"  (1.676 m).   Weight as of this encounter: 88.9 kg.    Nutritional Assessment: Body mass index is 31.64 kg/m.Marland Kitchen Seen by  dietician.  I agree with the assessment and plan as outlined below: Nutrition Status:        . Skin Assessment: I have examined the patient's skin and I agree with the wound assessment as performed by the wound care RN as outlined below:    Consultants:  General surgery  Procedures:  As above  Antimicrobials:  Anti-infectives (From admission, onward)    Start     Dose/Rate Route Frequency Ordered Stop   10/03/22 1015  cefTRIAXone (ROCEPHIN) 2 g in sodium chloride 0.9 % 100 mL IVPB  Status:  Discontinued        2 g 200 mL/hr over 30 Minutes Intravenous Every 24 hours 10/03/22 0925 10/04/22 1327   10/02/22 0945  cefTRIAXone (ROCEPHIN) 2 g in sodium chloride 0.9 % 100 mL IVPB        2 g 200 mL/hr over 30 Minutes Intravenous  Once 10/02/22 0942 10/02/22 1241         Subjective: Seen and.  Complains of worsening abdominal pain with intermittent nausea.  Does not feel like eating.  Objective: Vitals:   10/04/22 1456 10/04/22 1900 10/05/22 0600 10/05/22 0940  BP: 120/79 121/83 120/76 133/81  Pulse: 85 93 80 94  Resp: 16 18 17 20   Temp: 98 F (36.7 C) 98.4 F (36.9 C) 98.1 F (36.7 C) 98.4 F (36.9 C)  TempSrc: Oral Oral Oral Oral  SpO2: 97% 96% 96% 95%  Weight:      Height:        Intake/Output Summary (Last 24 hours) at 10/05/2022 1126 Last data filed at 10/05/2022 0437 Gross per 24 hour  Intake  1309.22 ml  Output --  Net 1309.22 ml    Filed Weights   10/02/22 0747 10/04/22 0958  Weight: 89.8 kg 88.9 kg    Examination:  General exam: Appears calm and comfortable  Respiratory system: Clear to auscultation. Respiratory effort normal. Cardiovascular system: S1 & S2 heard, RRR. No JVD, murmurs, rubs, gallops or clicks. No pedal edema. Gastrointestinal system: Abdomen is nondistended, soft and moderate to severe generalized abdominal tenderness. No organomegaly or masses felt. Normal bowel sounds heard. Central nervous system: Alert and oriented. No focal  neurological deficits. Extremities: Symmetric 5 x 5 power. Skin: No rashes, lesions or ulcers.  Psychiatry: Judgement and insight appear normal. Mood & affect appropriate.   Data Reviewed: I have personally reviewed following labs and imaging studies  CBC: Recent Labs  Lab 10/02/22 0755 10/03/22 0217 10/03/22 1017 10/04/22 0229 10/05/22 0332  WBC 14.8* 7.4 6.1 5.4 10.8*  NEUTROABS  --   --   --   --  8.3*  HGB 12.0 9.7* 10.9* 10.3* 10.0*  HCT 37.8 30.4* 33.2* 32.5* 31.4*  MCV 92.4 89.9 88.8 90.0 89.5  PLT 257 191 176 222 219    Basic Metabolic Panel: Recent Labs  Lab 10/02/22 0755 10/03/22 0217 10/04/22 0229 10/05/22 0332  NA 137 138 140 140  K 3.8 3.5 4.1 4.1  CL 103 104 102 102  CO2 24 25 28 26   GLUCOSE 93 93 77 115*  BUN 23* 18 8 9   CREATININE 1.00 0.81 0.78 0.79  CALCIUM 8.6* 7.9* 8.3* 8.5*    GFR: Estimated Creatinine Clearance: 85 mL/min (by C-G formula based on SCr of 0.79 mg/dL). Liver Function Tests: Recent Labs  Lab 10/02/22 0755 10/03/22 0217 10/04/22 0229 10/05/22 0332  AST 18 17 16  35  ALT 15 12 13 24   ALKPHOS 81 62 63 64  BILITOT 0.4 0.1* 0.3 0.2*  PROT 6.8 5.3* 5.7* 5.7*  ALBUMIN 3.1* 2.3* 2.5* 2.5*    Recent Labs  Lab 10/02/22 0755  LIPASE 22    No results for input(s): "AMMONIA" in the last 168 hours. Coagulation Profile: Recent Labs  Lab 10/03/22 0217  INR 1.2    Cardiac Enzymes: No results for input(s): "CKTOTAL", "CKMB", "CKMBINDEX", "TROPONINI" in the last 168 hours. BNP (last 3 results) No results for input(s): "PROBNP" in the last 8760 hours. HbA1C: No results for input(s): "HGBA1C" in the last 72 hours. CBG: Recent Labs  Lab 10/04/22 1215  GLUCAP 102*   Lipid Profile: No results for input(s): "CHOL", "HDL", "LDLCALC", "TRIG", "CHOLHDL", "LDLDIRECT" in the last 72 hours. Thyroid Function Tests: No results for input(s): "TSH", "T4TOTAL", "FREET4", "T3FREE", "THYROIDAB" in the last 72 hours. Anemia Panel: No  results for input(s): "VITAMINB12", "FOLATE", "FERRITIN", "TIBC", "IRON", "RETICCTPCT" in the last 72 hours. Sepsis Labs: No results for input(s): "PROCALCITON", "LATICACIDVEN" in the last 168 hours.  No results found for this or any previous visit (from the past 240 hour(s)).   Radiology Studies: DG Cholangiogram Operative  Result Date: 10/04/2022 CLINICAL DATA:  Intraoperative cholangiogram during laparoscopic cholecystectomy. EXAM: INTRAOPERATIVE CHOLANGIOGRAM FLUOROSCOPY TIME:  15 seconds (4.4 mGy) COMPARISON:  Right upper quadrant abdominal ultrasound-10/02/2022 FINDINGS: Intraoperative cholangiographic images of the right upper abdominal quadrant during laparoscopic cholecystectomy are provided for review. Surgical clips overlie the expected location of the gallbladder fossa. Contrast injection demonstrates selective cannulation of the central aspect of the cystic duct. There is passage of contrast through the central aspect of the cystic duct with filling of a non dilated common  bile duct. There is passage of contrast though the CBD and into the descending portion of the duodenum. There is minimal reflux of injected contrast into the common hepatic duct and central aspect of the non dilated intrahepatic biliary system. Nonocclusive filling defects are seen within the common hepatic duct, favored to represent air bubbles though conceivably choledocholithiasis could have a similar appearance. IMPRESSION: Nonocclusive filling defects the common duct, favored to represent air bubbles though conceivably choledocholithiasis could have a similar appearance. Correlation with the operative report is advised. Electronically Signed   By: Simonne Come M.D.   On: 10/04/2022 12:32    Scheduled Meds:  acetaminophen  1,000 mg Oral Q6H   divalproex  500 mg Oral Daily   docusate sodium  100 mg Oral BID   loratadine  10 mg Oral Daily   methocarbamol  1,000 mg Oral TID   multivitamin with minerals  1 tablet Oral  Daily   Continuous Infusions:  heparin 1,200 Units/hr (10/05/22 1610)     LOS: 3 days   Hughie Closs, MD [facility]  10/05/2022, 11:26 AM   *Please note that this is a verbal dictation therefore any spelling or grammatical errors are due to the "[employer] One" system interpretation.  Please page via Amion and do not message via secure chat for urgent patient care matters. Secure chat can be used for non urgent patient care matters.  How to contact the [facility] Attending or Consulting provider 7A - 7P or covering provider during after hours 7P -7A, for this patient?  Check the care team in [facility] and look for a) attending/consulting [facility] provider listed and b) the [facility] team listed. Page or secure chat 7A-7P. Log into [email or web address] and use [facility] universal password to access. If you do not have the password, please contact the hospital operator. Locate the [facility] provider you are looking for under [facility] and page to a number that you can be directly reached. If you still have difficulty reaching the provider, please page the [facility] (Director on Call) for the Hospitalists listed on amion for assistance.

## 2022-10-05 NOTE — Telephone Encounter (Signed)
Botox 200 unit Rx has been sent to [facility].

## 2022-10-05 NOTE — Care Management Important Message (Signed)
Important Message  Patient Details  Name: [name] MRN: [id number] Date of Birth: Sep 21, 1965   Medicare Important Message Given:  Yes     [name] 10/05/2022, 3:10 PM

## 2022-10-05 NOTE — Telephone Encounter (Signed)
Initiated new auth under [employer], previous auth expires 10/19/22 and pt is scheduled for 11/04/22.   Approved via [facility] portal: [id number] (10/05/22-10/04/23)  This will need to go to [facility] per [facility], please send Rx. Thank you!

## 2022-10-05 NOTE — Evaluation (Signed)
Physical Therapy Evaluation Patient Details Name: [name] MRN: [id number] DOB: 10-02-1965 Today's Date: 10/05/2022  History of Present Illness  Pt is 57 year old presented to [facility] on  10/02/22 for acute cholecystitis. Underwent laparoscopic cholecystectomy on 10/04/22. PMH - migraines, asthma, PE, bipolar, gastric bypass, HTN, RA, Graves disease  Clinical Impression  Pt presents to PT with decr mobility due to pain and inactivity. Expect pt will make good progress back to baseline with mobility. Will follow acutely but doubt pt will need PT after DC.         Recommendations for follow up therapy are one component of a multi-disciplinary discharge planning process, led by the attending physician.  Recommendations may be updated based on patient status, additional functional criteria and insurance authorization.  Follow Up Recommendations       Assistance Recommended at Discharge PRN  Patient can return home with the following       Equipment Recommendations None recommended by PT  Recommendations for Other Services       Functional Status Assessment Patient has had a recent decline in their functional status and demonstrates the ability to make significant improvements in function in a reasonable and predictable amount of time.     Precautions / Restrictions Precautions Precautions: Fall      Mobility  Bed Mobility Overal bed mobility: Needs Assistance Bed Mobility: Supine to Sit, Sit to Supine     Supine to sit: Supervision, HOB elevated Sit to supine: Min assist   General bed mobility comments: Assist to bring legs back up into the bed    Transfers Overall transfer level: Needs assistance Equipment used: Straight cane Transfers: Sit to/from Stand Sit to Stand: Min guard           General transfer comment: Incr time and effort    Ambulation/Gait Ambulation/Gait assistance: Min guard Gait Distance (Feet): 70 Feet Assistive device: Straight cane Gait  Pattern/deviations: Step-through pattern, Decreased step length - right, Decreased step length - left Gait velocity: Decr Gait velocity interpretation: <1.31 ft/sec, indicative of household ambulator   General Gait Details: Assist for safety. No overt loss of balance. Pt reports some dizziness from pain meds  Stairs            Wheelchair Mobility    Modified Rankin (Stroke Patients Only)       Balance Overall balance assessment: Needs assistance, History of Falls Sitting-balance support: No upper extremity supported, Feet supported Sitting balance-Leahy Scale: Normal     Standing balance support: No upper extremity supported, During functional activity Standing balance-Leahy Scale: Good                               Pertinent Vitals/Pain Pain Assessment Pain Assessment: 0-10 Pain Score: 7  Pain Location: rt posterior shoulder, abdomen Pain Descriptors / Indicators: Grimacing, Guarding, Aching, Sore Pain Intervention(s): Monitored during session, Limited activity within patient's tolerance, Premedicated before session, Repositioned    Home Living Family/patient expects to be discharged to:: Private residence Living Arrangements: Children Available Help at Discharge: Family;Available PRN/intermittently Type of Home: House Home Access: Stairs to enter Entrance Stairs-Rails: None Entrance Stairs-Number of Steps: 2   Home Layout: One level Home Equipment: Shower seat;Cane - single point      Prior Function Prior Level of Function : Independent/Modified Independent;History of Falls (last six months)             Mobility Comments: Uses straight cane  Hand Dominance   Dominant Hand: Right    Extremity/Trunk Assessment   Upper Extremity Assessment Upper Extremity Assessment: Overall WFL for tasks assessed    Lower Extremity Assessment Lower Extremity Assessment: Generalized weakness       Communication   Communication: No  difficulties  Cognition Arousal/Alertness: Awake/alert Behavior During Therapy: WFL for tasks assessed/performed Overall Cognitive Status: Within Functional Limits for tasks assessed                                          General Comments      Exercises     Assessment/Plan    PT Assessment Patient needs continued PT services  PT Problem List Decreased mobility;Decreased strength;Decreased balance;Pain       PT Treatment Interventions DME instruction;Gait training;Functional mobility training;Stair training;Therapeutic activities;Therapeutic exercise;Balance training;Patient/family education    PT Goals (Current goals can be found in the Care Plan section)  Acute Rehab PT Goals Patient Stated Goal: decr pain PT Goal Formulation: With patient Time For Goal Achievement: 10/12/22 Potential to Achieve Goals: Good    Frequency Min 3X/week     Co-evaluation               AM-PAC PT "6 Clicks" Mobility  Outcome Measure Help needed turning from your back to your side while in a flat bed without using bedrails?: None Help needed moving from lying on your back to sitting on the side of a flat bed without using bedrails?: None Help needed moving to and from a bed to a chair (including a wheelchair)?: A Little Help needed standing up from a chair using your arms (e.g., wheelchair or bedside chair)?: A Little Help needed to walk in hospital room?: A Little Help needed climbing 3-5 steps with a railing? : A Little 6 Click Score: 20    End of Session   Activity Tolerance: Patient tolerated treatment well Patient left: in bed;with call bell/phone within reach Nurse Communication: Mobility status PT Visit Diagnosis: Other abnormalities of gait and mobility (R26.89);History of falling (Z91.81);Pain Pain - Right/Left: Right Pain - part of body: Shoulder (abdoment)    Time: 1610-9604 PT Time Calculation (min) (ACUTE ONLY): 21 min   Charges:   PT  Evaluation $PT Eval Low Complexity: 1 Low          [facility] PT Acute Rehabilitation Services Office [phone]   Angelina Ok [facility] 10/05/2022, 3:41 PM

## 2022-10-06 ENCOUNTER — Inpatient Hospital Stay (HOSPITAL_COMMUNITY): Payer: 59

## 2022-10-06 ENCOUNTER — Other Ambulatory Visit (HOSPITAL_COMMUNITY): Payer: Self-pay

## 2022-10-06 DIAGNOSIS — M79662 Pain in left lower leg: Secondary | ICD-10-CM | POA: Diagnosis not present

## 2022-10-06 DIAGNOSIS — K8000 Calculus of gallbladder with acute cholecystitis without obstruction: Secondary | ICD-10-CM | POA: Diagnosis not present

## 2022-10-06 LAB — CBC WITH DIFFERENTIAL/PLATELET
Abs Immature Granulocytes: 0.03 10*3/uL (ref 0.00–0.07)
Basophils Absolute: 0 10*3/uL (ref 0.0–0.1)
Basophils Relative: 0 %
Eosinophils Absolute: 0.1 10*3/uL (ref 0.0–0.5)
Eosinophils Relative: 1 %
HCT: 35.9 % — ABNORMAL LOW (ref 36.0–46.0)
Hemoglobin: 11.4 g/dL — ABNORMAL LOW (ref 12.0–15.0)
Immature Granulocytes: 0 %
Lymphocytes Relative: 25 %
Lymphs Abs: 2 10*3/uL (ref 0.7–4.0)
MCH: 28.7 pg (ref 26.0–34.0)
MCHC: 31.8 g/dL (ref 30.0–36.0)
MCV: 90.4 fL (ref 80.0–100.0)
Monocytes Absolute: 1.6 10*3/uL — ABNORMAL HIGH (ref 0.1–1.0)
Monocytes Relative: 19 %
Neutro Abs: 4.4 10*3/uL (ref 1.7–7.7)
Neutrophils Relative %: 55 %
Platelets: 214 10*3/uL (ref 150–400)
RBC: 3.97 MIL/uL (ref 3.87–5.11)
RDW: 14.3 % (ref 11.5–15.5)
WBC: 8.2 10*3/uL (ref 4.0–10.5)
nRBC: 0 % (ref 0.0–0.2)

## 2022-10-06 LAB — SURGICAL PATHOLOGY

## 2022-10-06 MED ORDER — OXYCODONE HCL 5 MG PO TABS
5.0000 mg | ORAL_TABLET | Freq: Four times a day (QID) | ORAL | 0 refills | Status: DC | PRN
  Filled 2022-10-06: qty 15, 4d supply, fill #0

## 2022-10-06 NOTE — Discharge Summary (Signed)
Physician Discharge Summary  [name]:[id number] DOB: 10/01/1965 DOA: 10/02/2022  PCP: Iona Hansen, NP  Admit date: 10/02/2022 Discharge date: 10/06/2022 Recommendations for Outpatient Follow-up:  Follow up with PCP in 1 weeks-call for appointment Please obtain BMP/CBC in one week  Discharge Dispo: Home Discharge Condition: Stable Code Status:   Code Status: Full Code Diet recommendation:  Diet Order             Diet regular Room service appropriate? Yes; Fluid consistency: Thin  Diet effective now                    Brief/Interim Summary:  57 y.o. 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  Underwent Laparoscopic Cholecystectomy w/ [facility] 10/04/2022.  Postop doing well had some pain monitored overnight 5/14, seen by surgery 5/15 okay for discharge home.  She had a duplex ordered for pain on left lower extremity, she is back on her Eliquis.   Discharge Diagnoses:  Active Problems:   GERD (gastroesophageal reflux disease)   Bipolar disorder (HCC)   Cholecystitis, acute with cholelithiasis  Acute gallstone cholecystitis: Underwent laparoscopic cholecystectomy on 5/13 postop doing well diet advanced, okay for discharge home per general surgery and outpatient follow-up.    History of PE in January 2024: Eliquis resumed postop, had some leg pain DVT study has been ordered prior to discharge.     History of migraines: Controlled.  Continue home medications  Consults: CCS Subjective: Aaox3, DOING WELL NO PAIN   Discharge Exam: Vitals:   10/06/22 0210 10/06/22 0833  BP: 134/83 101/63  Pulse: 71 82  Resp: 20 19  Temp: 97.8 F (36.6 C) 98.2 F (36.8 C)   SpO2: 100% 100%   General: Pt is alert, awake, not in acute distress Cardiovascular: RRR, S1/S2 +, no rubs, no gallops Respiratory: CTA bilaterally, no wheezing, no rhonchi Abdominal: Soft, NT, ND, bowel sounds + Extremities: no edema, no cyanosis  Discharge Instructions  Discharge Instructions     Discharge instructions   Complete by: As directed    Please call call MD or return to ER for similar or worsening recurring problem that brought you to hospital or if any fever,nausea/vomiting,abdominal pain, uncontrolled pain, chest pain,  shortness of breath or any other alarming symptoms.  Please follow-up your doctor as instructed in a week time and call the office for appointment.  Please avoid alcohol, smoking, or any other illicit substance and maintain healthy habits including taking your regular medications as prescribed.  You were cared for by a hospitalist during your hospital stay. If you have any questions about your discharge medications or the care you received while you were in the hospital after you are discharged, you can call the unit and ask to speak with the hospitalist on call if the hospitalist that took care of you is not available.  Once you are discharged, your primary care physician will handle any further medical issues. Please note that NO REFILLS for any discharge medications will be authorized once you are discharged, as it is imperative that you return to your primary care physician (or establish a relationship with a primary care physician if you do not have one) for your aftercare  needs so that they can reassess your need for medications and monitor your lab values   Increase activity slowly   Complete by: As directed       Allergies as of 10/06/2022       Reactions   Bee Venom Anaphylaxis, Rash   Coconut (cocos Nucifera) Anaphylaxis, Itching   Coconut Fatty Acids Anaphylaxis   Coconut Oil Anaphylaxis, Itching   Food Anaphylaxis, Itching   Walnuts -  anaphylaxis, itching   Mixed Ragweed Anaphylaxis, Itching, Rash   Molds & Smuts Anaphylaxis   Mushroom Ext Cmplx(shiitake-reishi-mait) Anaphylaxis   Nutritional Supplements Anaphylaxis, Itching   walnuts   Shellfish Allergy Anaphylaxis   Strawberry Extract Anaphylaxis   Oxycodone Itching   Latex Rash   Norco [hydrocodone-acetaminophen] Rash        Medication List     TAKE these medications    acetaminophen 650 MG CR tablet Commonly known as: TYLENOL Take 1,300 mg by mouth as needed for pain.   albuterol 108 (90 Base) MCG/ACT inhaler Commonly known as: VENTOLIN HFA Inhale 2 puffs into the lungs every 6 (six) hours as needed for wheezing.   azelastine 0.1 % nasal spray Commonly known as: ASTELIN Place 2 sprays into both nostrils daily as needed for allergies. Use in each nostril as directed   BIOTIN PO Take 1 capsule by mouth daily.   Botox 200 units injection Generic drug: botulinum toxin Type A Provider to inject 155 units into the muscles of the head and neck every 12 weeks. Discard remainder.   calcium carbonate 500 MG chewable tablet Commonly known as: TUMS - dosed in mg elemental calcium Chew 1,000 mg by mouth as needed for indigestion or heartburn.   cetirizine 10 MG tablet Commonly known as: ZYRTEC Take 10 mg by mouth daily.   diclofenac Sodium 1 % Gel Commonly known as: VOLTAREN Apply 4 g topically 4 (four) times daily. What changed:  how much to take when to take this reasons to take this   divalproex 500 MG 24 hr tablet Commonly known as: DEPAKOTE ER Take 2 tablets (1,000 mg total) by mouth at bedtime AND 1 tablet (500 mg total) daily. What changed: See the new instructions.   doxepin 150 MG capsule Commonly known as: SINEQUAN Take 150 mg by mouth at bedtime.   Eliquis 5 MG Tabs tablet Generic drug: apixaban Take 2 tablets (10 mg total) by mouth 2 (two) times daily for 4 days, THEN 1 tablet (5 mg total) 2 (two) times daily for 21 days. Start  taking on: June 04, 2022 What changed: See the new instructions.   Emgality 120 MG/ML Soaj Generic drug: Galcanezumab-gnlm INJECT 120 MG INTO THE SKIN EVERY 30 DAYS What changed: See the new instructions.   EpiPen 2-Pak 0.3 mg/0.3 mL Soaj injection Generic drug: EPINEPHrine Inject 0.3 mg into the muscle as needed for anaphylaxis.   fluticasone 50 MCG/ACT nasal spray Commonly known as: FLONASE Place 1 spray into both nostrils as needed (allergies/runny nose).   lidocaine 5 % Commonly known as: LIDODERM Place 1 patch onto the skin daily as needed (pain).   Melatonin 10 MG Chew Chew 30 mg by mouth at bedtime as needed (sleep).   montelukast 10 MG tablet Commonly known as: SINGULAIR Take 10 mg by mouth at bedtime as needed (allergies).   Mounjaro 7.5 MG/0.5ML Pen Generic drug: tirzepatide Inject 7.5 mg into the skin once a week.   multivitamin tablet Take 1 tablet by mouth in the morning.  Narcan 4 MG/0.1ML Liqd nasal spray kit Generic drug: naloxone Place 0.4 mg into the nose once.   Nucynta 75 MG tablet Generic drug: tapentadol HCl Take 75 mg by mouth 2 (two) times daily as needed for moderate pain or severe pain.   Nurtec 75 MG Tbdp Generic drug: Rimegepant Sulfate Take 1 tablet (75 mg total) by mouth daily. What changed:  when to take this reasons to take this   oxyCODONE 5 MG immediate release tablet Commonly known as: Oxy IR/ROXICODONE Take 1 tablet (5 mg total) by mouth every 6 (six) hours as needed for breakthrough pain.   VITAMIN B-12 PO Take 1 capsule by mouth daily.   VITAMIN C PO Take 1 tablet by mouth daily.        Follow-up Information     Maczis, Hedda Slade, PA-C Follow up on 10/28/2022.   Specialty: General Surgery Why: 10/28/22 at 3:30 pm. Please arrive 30 minutes prior to your appointment for paperwork. Bring a copy of your photo ID and insurance card. Contact information: [address] [place]  [address] [phone]         Iona Hansen, NP Follow up.   Specialty: Nurse Practitioner Why: For follow up Contact information: [address] [place] [address] [phone]                Allergies  Allergen Reactions   Bee Venom Anaphylaxis and Rash   Coconut (Cocos Nucifera) Anaphylaxis and Itching   Coconut Fatty Acids Anaphylaxis   Coconut Oil Anaphylaxis and Itching   Food Anaphylaxis and Itching    Walnuts - anaphylaxis, itching   Mixed Ragweed Anaphylaxis, Itching and Rash   Molds & Smuts Anaphylaxis   Mushroom Ext Cmplx(Shiitake-Reishi-Mait) Anaphylaxis   Nutritional Supplements Anaphylaxis and Itching    walnuts   Shellfish Allergy Anaphylaxis   Strawberry Extract Anaphylaxis   Oxycodone Itching   Latex Rash   Norco [Hydrocodone-Acetaminophen] Rash    The results of significant diagnostics from this hospitalization (including imaging, microbiology, ancillary and laboratory) are listed below for reference.    Microbiology: No results found for this or any previous visit (from the past 240 hour(s)).  Procedures/Studies: DG CHEST PORT 1 VIEW  Result Date: 10/05/2022 CLINICAL DATA:  SOB (shortness of breath) EXAM: PORTABLE CHEST 1 VIEW COMPARISON:  Chest x-ray May 11 24. FINDINGS: Chronic asymmetric elevation left hemidiaphragm. Streaky bibasilar opacities. No visible pleural effusions or pneumothorax. Cardiomediastinal silhouette is unchanged. IMPRESSION: Chronically elevated left hemidiaphragm with streaky bibasilar opacities, probably atelectasis. Electronically Signed   By: Feliberto Harts M.D.   On: 10/05/2022 15:26   DG Cholangiogram Operative  Result Date: 10/04/2022 CLINICAL DATA:  Intraoperative cholangiogram during laparoscopic cholecystectomy. EXAM: INTRAOPERATIVE CHOLANGIOGRAM FLUOROSCOPY TIME:  15 seconds (4.4 mGy) COMPARISON:  Right upper quadrant abdominal ultrasound-10/02/2022 FINDINGS: Intraoperative cholangiographic images  of the right upper abdominal quadrant during laparoscopic cholecystectomy are provided for review. Surgical clips overlie the expected location of the gallbladder fossa. Contrast injection demonstrates selective cannulation of the central aspect of the cystic duct. There is passage of contrast through the central aspect of the cystic duct with filling of a non dilated common bile duct. There is passage of contrast though the CBD and into the descending portion of the duodenum. There is minimal reflux of injected contrast into the common hepatic duct and central aspect of the non dilated intrahepatic biliary system. Nonocclusive filling defects are seen within the common hepatic duct, favored to represent air  bubbles though conceivably choledocholithiasis could have a similar appearance. IMPRESSION: Nonocclusive filling defects the common duct, favored to represent air bubbles though conceivably choledocholithiasis could have a similar appearance. Correlation with the operative report is advised. Electronically Signed   By: Simonne Come M.D.   On: 10/04/2022 12:32   US Abdomen Limited RUQ (LIVER/GB)  Result Date: 10/02/2022 CLINICAL DATA:  [id number] RUQ abdominal pain [id number] EXAM: ULTRASOUND ABDOMEN LIMITED RIGHT UPPER QUADRANT COMPARISON:  CT 05/25/2022 FINDINGS: Gallbladder: Cholelithiasis with multiple intraluminal gallstones, largest measuring up to 0.9 cm. No wall thickening. Negative sonographic Murphy sign. The gallbladder is non-dilated. Common bile duct: Diameter: 8 mm, mildly dilated. Liver: Mildly increased liver echogenicity. No focal liver lesion. Portal vein is patent on color Doppler imaging with normal direction of blood flow towards the liver. Other: None. IMPRESSION: Cholelithiasis with multiple intraluminal gallstones, largest measuring 0.9 cm. No evidence of acute cholecystitis. Mildly dilated common bile duct measuring 8 mm. Correlate with LFTs and consider further evaluation with MRCP. Mild  hepatic steatosis. Electronically Signed   By: Caprice Renshaw M.D.   On: 10/02/2022 09:07   DG Chest Portable 1 View  Result Date: 10/02/2022 CLINICAL DATA:  Shortness of breath. EXAM: PORTABLE CHEST 1 VIEW COMPARISON:  06/01/2022 FINDINGS: Mild asymmetric elevation left hemidiaphragm. Interstitial markings are diffusely coarsened with chronic features. Similar streaky opacity at the bases suggest atelectasis. No overt edema or focal lung consolidation. No substantial pleural effusion. The cardio pericardial silhouette is enlarged. IMPRESSION: Chronic interstitial coarsening with bibasilar atelectasis. No acute cardiopulmonary findings. Electronically Signed   By: Kennith Center M.D.   On: 10/02/2022 08:48    Labs: BNP (last 3 results) Recent Labs    06/01/22 1356  BNP 12.1   Basic Metabolic Panel: Recent Labs  Lab 10/02/22 0755 10/03/22 0217 10/04/22 0229 10/05/22 0332  NA 137 138 140 140  K 3.8 3.5 4.1 4.1  CL 103 104 102 102  CO2 24 25 28 26   GLUCOSE 93 93 77 115*  BUN 23* 18 8 9   CREATININE 1.00 0.81 0.78 0.79  CALCIUM 8.6* 7.9* 8.3* 8.5*   Liver Function Tests: Recent Labs  Lab 10/02/22 0755 10/03/22 0217 10/04/22 0229 10/05/22 0332  AST 18 17 16  35  ALT 15 12 13 24   ALKPHOS 81 62 63 64  BILITOT 0.4 0.1* 0.3 0.2*  PROT 6.8 5.3* 5.7* 5.7*  ALBUMIN 3.1* 2.3* 2.5* 2.5*   Recent Labs  Lab 10/02/22 0755  LIPASE 22   No results for input(s): "AMMONIA" in the last 168 hours. CBC: Recent Labs  Lab 10/03/22 0217 10/03/22 1017 10/04/22 0229 10/05/22 0332 10/06/22 0252  WBC 7.4 6.1 5.4 10.8* 8.2  NEUTROABS  --   --   --  8.3* 4.4  HGB 9.7* 10.9* 10.3* 10.0* 11.4*  HCT 30.4* 33.2* 32.5* 31.4* 35.9*  MCV 89.9 88.8 90.0 89.5 90.4  PLT 191 176 222 219 214   Cardiac Enzymes: No results for input(s): "CKTOTAL", "CKMB", "CKMBINDEX", "TROPONINI" in the last 168 hours. BNP: Invalid input(s): "POCBNP" CBG: Recent Labs  Lab 10/04/22 1215  GLUCAP 102*   No results  for input(s): "VITAMINB12", "FOLATE", "FERRITIN", "TIBC", "IRON", "RETICCTPCT" in the last 72 hours. Urinalysis    Component Value Date/Time   COLORURINE AMBER (A) 10/02/2022 0737   APPEARANCEUR HAZY (A) 10/02/2022 0737   APPEARANCEUR Clear 08/13/2020 1447   LABSPEC 1.023 10/02/2022 0737   PHURINE 5.0 10/02/2022 0737   GLUCOSEU NEGATIVE 10/02/2022 0737   HGBUR NEGATIVE 10/02/2022  0737   BILIRUBINUR NEGATIVE 10/02/2022 0737   BILIRUBINUR Negative 08/13/2020 1447   KETONESUR NEGATIVE 10/02/2022 0737   PROTEINUR 30 (A) 10/02/2022 0737   UROBILINOGEN 0.2 02/13/2015 1502   NITRITE NEGATIVE 10/02/2022 0737   LEUKOCYTESUR NEGATIVE 10/02/2022 0737   Sepsis Labs Recent Labs  Lab 10/03/22 1017 10/04/22 0229 10/05/22 0332 10/06/22 0252  WBC 6.1 5.4 10.8* 8.2   Microbiology No results found for this or any previous visit (from the past 240 hour(s)).  Time coordinating discharge: 25 minutes  SIGNED: Lanae Boast, MD  [facility] 10/06/2022, 11:40 AM  If 7PM-7AM, please contact night-coverage [email or web address]

## 2022-10-06 NOTE — Progress Notes (Signed)
2 Days Post-Op  Subjective: CC: Feeling better. Still some soreness around her incisions that is controlled with changes made to her po medications yesterday. Itching well controlled w/ prn benadryl. No rash, facial swelling, tongue swelling, or worsening sob. Ambulating more and was ambulating in the room without assistive device when I entered the room. R shoulder pain has improved after ambulation. Tolerating regular diet without vomiting. Passing flatus. Voiding. Does not some new L calf pain.   Afebrile. No tachycardia or hypotension. On RA. Pulling 1250 on IS. WBC wnl. LFT's non-elevated yesterday.    Objective: Vital signs in last 24 hours: Temp:  [97.8 F (36.6 C)-98.5 F (36.9 C)] 98.2 F (36.8 C) (05/15 0833) Pulse Rate:  [71-90] 82 (05/15 0833) Resp:  [16-20] 19 (05/15 0833) BP: (101-134)/(61-88) 101/63 (05/15 0833) SpO2:  [100 %] 100 % (05/15 0833) Last BM Date : 10/01/22  Intake/Output from previous day: 05/14 0701 - 05/15 0700 In: 98.4 [I.V.:98.4] Out: -  Intake/Output this shift: No intake/output data recorded.  PE: Gen:  Alert, NAD, pleasant Card:  Reg Pulm:  CTAB, no W/R/R, effort normal. On RA. 1250 on IS.  Abd: Soft, ND, appears appropriately tender around laparoscopic incisions, no rigidity or guarding. +BS. Otherwise NT. Umbilical incision with some dried blood on gauze with overlying tegaderm but no signs of active drainage. Other laparoscopic incisions with gauze and tegaderm in place, cdi Ext:  No LE edema. L calf ttp.  Psych: A&Ox3   Lab Results:  Recent Labs    10/05/22 0332 10/06/22 0252  WBC 10.8* 8.2  HGB 10.0* 11.4*  HCT 31.4* 35.9*  PLT 219 214    BMET Recent Labs    10/04/22 0229 10/05/22 0332  NA 140 140  K 4.1 4.1  CL 102 102  CO2 28 26  GLUCOSE 77 115*  BUN 8 9  CREATININE 0.78 0.79  CALCIUM 8.3* 8.5*    PT/INR No results for input(s): "LABPROT", "INR" in the last 72 hours.  CMP     Component Value Date/Time    NA 140 10/05/2022 0332   NA 143 07/09/2020 1536   K 4.1 10/05/2022 0332   CL 102 10/05/2022 0332   CO2 26 10/05/2022 0332   GLUCOSE 115 (H) 10/05/2022 0332   BUN 9 10/05/2022 0332   BUN 19 07/09/2020 1536   CREATININE 0.79 10/05/2022 0332   CREATININE 0.96 08/03/2021 1352   CREATININE 0.85 06/12/2014 2031   CALCIUM 8.5 (L) 10/05/2022 0332   PROT 5.7 (L) 10/05/2022 0332   PROT 7.0 07/09/2020 1536   ALBUMIN 2.5 (L) 10/05/2022 0332   ALBUMIN 4.2 07/09/2020 1536   AST 35 10/05/2022 0332   AST 12 (L) 08/03/2021 1352   ALT 24 10/05/2022 0332   ALT 14 08/03/2021 1352   ALKPHOS 64 10/05/2022 0332   BILITOT 0.2 (L) 10/05/2022 0332   BILITOT 0.3 08/03/2021 1352   GFRNONAA >60 10/05/2022 0332   GFRNONAA >60 08/03/2021 1352   GFRAA 95 07/09/2020 1536   Lipase     Component Value Date/Time   LIPASE 22 10/02/2022 0755    Studies/Results: DG CHEST PORT 1 VIEW  Result Date: 10/05/2022 CLINICAL DATA:  SOB (shortness of breath) EXAM: PORTABLE CHEST 1 VIEW COMPARISON:  Chest x-ray May 11 24. FINDINGS: Chronic asymmetric elevation left hemidiaphragm. Streaky bibasilar opacities. No visible pleural effusions or pneumothorax. Cardiomediastinal silhouette is unchanged. IMPRESSION: Chronically elevated left hemidiaphragm with streaky bibasilar opacities, probably atelectasis. Electronically Signed   By: Gelene Mink  Barnett Applebaum M.D.   On: 10/05/2022 15:26   DG Cholangiogram Operative  Result Date: 10/04/2022 CLINICAL DATA:  Intraoperative cholangiogram during laparoscopic cholecystectomy. EXAM: INTRAOPERATIVE CHOLANGIOGRAM FLUOROSCOPY TIME:  15 seconds (4.4 mGy) COMPARISON:  Right upper quadrant abdominal ultrasound-10/02/2022 FINDINGS: Intraoperative cholangiographic images of the right upper abdominal quadrant during laparoscopic cholecystectomy are provided for review. Surgical clips overlie the expected location of the gallbladder fossa. Contrast injection demonstrates selective cannulation of the  central aspect of the cystic duct. There is passage of contrast through the central aspect of the cystic duct with filling of a non dilated common bile duct. There is passage of contrast though the CBD and into the descending portion of the duodenum. There is minimal reflux of injected contrast into the common hepatic duct and central aspect of the non dilated intrahepatic biliary system. Nonocclusive filling defects are seen within the common hepatic duct, favored to represent air bubbles though conceivably choledocholithiasis could have a similar appearance. IMPRESSION: Nonocclusive filling defects the common duct, favored to represent air bubbles though conceivably choledocholithiasis could have a similar appearance. Correlation with the operative report is advised. Electronically Signed   By: Simonne Come M.D.   On: 10/04/2022 12:32    Anti-infectives: Anti-infectives (From admission, onward)    Start     Dose/Rate Route Frequency Ordered Stop   10/03/22 1015  cefTRIAXone (ROCEPHIN) 2 g in sodium chloride 0.9 % 100 mL IVPB  Status:  Discontinued        2 g 200 mL/hr over 30 Minutes Intravenous Every 24 hours 10/03/22 0925 10/04/22 1327   10/02/22 0945  cefTRIAXone (ROCEPHIN) 2 g in sodium chloride 0.9 % 100 mL IVPB        2 g 200 mL/hr over 30 Minutes Intravenous  Once 10/02/22 1610 10/02/22 1241        Assessment/Plan POD 2 s/p Laparoscopic Cholecystectomy w/ IOC for Acute Cholecystitis by Dr. Corliss Skains - 10/04/22 - IOC w/ non-occlusive filling defects of CBD, favored to represent air bubbles. LFT's post op wnl - Mobilize, PT recommends no f/u - Pulm toilet, IS - LLE [name] pending. If negative/once resulted, okay to d/c from our standpoint. Will arrange f/u and send pain meds to her pharmacy. Discussed discharge instructions, restrictions and return/call back precautions.   FEN - Reg diet. IVF per [facility] VTE - SCDs, Eliquis ID - Rocephin 5/11 - 5/13. Afebrile.  Foley - None, voiding.   Hx PE on  Eliquis Hx gastric bypass - cont multivitamin. Avoid NSAIDs   LOS: 4 days    Jacinto Halim , [facility] 10/06/2022, 10:10 AM Please see Amion for pager number during day hours 7:00am-4:30pm

## 2022-10-06 NOTE — Telephone Encounter (Signed)
Received [employer] Rx approval: [id number] (10/05/22-01/05/23)

## 2022-10-06 NOTE — Hospital Course (Signed)
57 y.o. 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  Underwent Laparoscopic Cholecystectomy w/ [facility] 10/04/2022.  Postop doing well had some pain monitored overnight 5/14, seen by surgery 5/15 okay for discharge home.  She had a duplex ordered for pain on left lower extremity, she is back on her Eliquis.

## 2022-10-06 NOTE — TOC Transition Note (Signed)
Transition of Care [facility]) - CM/SW Discharge Note   Patient Details  Name: [name] MRN: [id number] Date of Birth: [birth year]  [facility] CM/SW Contact:  Harriet Masson, RN Phone Number: 10/06/2022, 2:09 PM   Clinical Narrative:     Patient stale for discharge.  Will follow up with PCP and surgeon.  No other TOC needs. Final next level of care: Home/Self Care Barriers to Discharge: Barriers Resolved   Patient Goals and CMS Choice    Return home  Discharge Placement         home                Discharge Plan and Services Additional resources added to the After Visit Summary for                                       Social Determinants of Health (SDOH) Interventions SDOH Screenings   Food Insecurity: No Food Insecurity (10/02/2022)  Housing: Low Risk  (10/02/2022)  Transportation Needs: No Transportation Needs (10/02/2022)  Utilities: Not At Risk (10/02/2022)  Depression (PHQ2-9): High Risk (07/22/2022)  Tobacco Use: Low Risk  (10/05/2022)     Readmission Risk Interventions    10/06/2022    2:05 PM  Readmission Risk Prevention Plan  Transportation Screening Complete  PCP or Specialist Appt within 3-5 Days Complete  HRI or Home Care Consult Complete  Social Work Consult for Recovery Care Planning/Counseling Complete  Palliative Care Screening Not Applicable  Medication Review Oceanographer) Complete

## 2022-10-06 NOTE — Progress Notes (Signed)
Physical Therapy Treatment Patient Details Name: [name] MRN: [id number] DOB: [birth year] Today's Date: 10/06/2022   History of Present Illness Pt is 57 year old presented to [facility] on  10/02/22 for acute cholecystitis. Underwent laparoscopic cholecystectomy on 10/04/22. PMH - migraines, asthma, PE, bipolar, gastric bypass, HTN, RA, Graves disease    PT Comments    Pt greeted resting in bed and agreeable to session, however pt continues to be limited by abdominal pain, decreased activity tolerance, and impaired balance/postural reactions. Pt demonstrating transfers and gait with RW support at grossly min guard to supervision level with cues for upright posture as pt guarding abdomen and flexing trunk, pt able to correct intermittently. Educated pt on importance of continued mobility and appropriate activity progression with pt verbalizing understanding. Anticipate safe discharge, with assist level outlined below, once medically cleared, will continue to follow acutely.    Recommendations for follow up therapy are one component of a multi-disciplinary discharge planning process, led by the attending physician.  Recommendations may be updated based on patient status, additional functional criteria and insurance authorization.  Follow Up Recommendations       Assistance Recommended at Discharge PRN  Patient can return home with the following     Equipment Recommendations  None recommended by PT    Recommendations for Other Services       Precautions / Restrictions Precautions Precautions: Fall     Mobility  Bed Mobility Overal bed mobility: Needs Assistance Bed Mobility: Supine to Sit     Supine to sit: Supervision, HOB elevated     General bed mobility comments: supervision for safety    Transfers Overall transfer level: Needs assistance Equipment used: None Transfers: Sit to/from Stand Sit to Stand: Min guard           General transfer comment: Incr time and  effort    Ambulation/Gait Ambulation/Gait assistance: Supervision Gait Distance (Feet): 85 Feet Assistive device: Rollator (4 wheels) Gait Pattern/deviations: Step-through pattern, Decreased step length - right, Decreased step length - left Gait velocity: Decr     General Gait Details: slow guarded gait, cues for upright posture with pt reporting pulling sensation at navel when extending trunk   Stairs             Wheelchair Mobility    Modified Rankin (Stroke Patients Only)       Balance Overall balance assessment: Needs assistance, History of Falls Sitting-balance support: No upper extremity supported, Feet supported Sitting balance-Leahy Scale: Normal     Standing balance support: No upper extremity supported, During functional activity Standing balance-Leahy Scale: Good                              Cognition Arousal/Alertness: Awake/alert Behavior During Therapy: WFL for tasks assessed/performed Overall Cognitive Status: Within Functional Limits for tasks assessed                                          Exercises      General Comments General comments (skin integrity, edema, etc.): VSS on RA      Pertinent Vitals/Pain Pain Assessment Pain Assessment: Faces Faces Pain Scale: Hurts even more Pain Location: abdomen Pain Descriptors / Indicators: Grimacing, Guarding, Aching, Sore Pain Intervention(s): Monitored during session, Limited activity within patient's tolerance    Home Living  Prior Function            PT Goals (current goals can now be found in the care plan section) Acute Rehab PT Goals Patient Stated Goal: decr pain PT Goal Formulation: With patient Time For Goal Achievement: 10/12/22 Progress towards PT goals: Progressing toward goals    Frequency    Min 3X/week      PT Plan      Co-evaluation              AM-PAC PT "6 Clicks" Mobility   Outcome  Measure  Help needed turning from your back to your side while in a flat bed without using bedrails?: None Help needed moving from lying on your back to sitting on the side of a flat bed without using bedrails?: None Help needed moving to and from a bed to a chair (including a wheelchair)?: A Little Help needed standing up from a chair using your arms (e.g., wheelchair or bedside chair)?: A Little Help needed to walk in hospital room?: A Little Help needed climbing 3-5 steps with a railing? : A Little 6 Click Score: 20    End of Session   Activity Tolerance: Patient tolerated treatment well Patient left: in bed;with call bell/phone within reach (seated EOB) Nurse Communication: Mobility status PT Visit Diagnosis: Other abnormalities of gait and mobility (R26.89);History of falling (Z91.81);Pain Pain - Right/Left: Right Pain - part of body: Shoulder (abdoment)     Time: 1610-9604 PT Time Calculation (min) (ACUTE ONLY): 16 min  Charges:  $Gait Training: 8-22 mins                     Woodfin Kiss R. PTA Acute Rehabilitation Services Office: [phone]   Catalina Antigua 10/06/2022, 4:16 PM

## 2022-10-06 NOTE — Progress Notes (Signed)
Lower extremity venous left study completed.   Please see CV Proc for preliminary results.   Lamiracle Chaidez, RDMS, RVT  

## 2022-10-06 NOTE — Plan of Care (Signed)
A/Ox4 and on room air. Prn oxy given x2. Continues to have significant gas, patient walks to relieve. Up with self care. Vitals stable. No overnight issues    Problem: Education: Goal: Knowledge of General Education information will improve Description: Including pain rating scale, medication(s)/side effects and non-pharmacologic comfort measures Outcome: Progressing   Problem: Health Behavior/Discharge Planning: Goal: Ability to manage health-related needs will improve Outcome: Progressing   Problem: Clinical Measurements: Goal: Ability to maintain clinical measurements within normal limits will improve Outcome: Progressing Goal: Will remain free from infection Outcome: Progressing Goal: Diagnostic test results will improve Outcome: Progressing Goal: Respiratory complications will improve Outcome: Progressing Goal: Cardiovascular complication will be avoided Outcome: Progressing   Problem: Activity: Goal: Risk for activity intolerance will decrease Outcome: Progressing   Problem: Nutrition: Goal: Adequate nutrition will be maintained Outcome: Progressing   Problem: Coping: Goal: Level of anxiety will decrease Outcome: Progressing   Problem: Elimination: Goal: Will not experience complications related to bowel motility Outcome: Progressing Goal: Will not experience complications related to urinary retention Outcome: Progressing   Problem: Pain Managment: Goal: General experience of comfort will improve Outcome: Progressing   Problem: Safety: Goal: Ability to remain free from injury will improve Outcome: Progressing   Problem: Skin Integrity: Goal: Risk for impaired skin integrity will decrease Outcome: Progressing

## 2022-10-07 ENCOUNTER — Other Ambulatory Visit (HOSPITAL_COMMUNITY): Payer: Self-pay

## 2022-10-18 ENCOUNTER — Encounter (HOSPITAL_COMMUNITY): Payer: Self-pay | Admitting: Emergency Medicine

## 2022-10-18 ENCOUNTER — Other Ambulatory Visit: Payer: Self-pay

## 2022-10-18 ENCOUNTER — Emergency Department (HOSPITAL_COMMUNITY)
Admission: EM | Admit: 2022-10-18 | Discharge: 2022-10-19 | Disposition: A | Discharge status: Home / Self Care | Payer: 59 | Attending: Emergency Medicine | Admitting: Emergency Medicine

## 2022-10-18 ENCOUNTER — Emergency Department (HOSPITAL_COMMUNITY): Payer: 59

## 2022-10-18 DIAGNOSIS — Z794 Long term (current) use of insulin: Secondary | ICD-10-CM | POA: Insufficient documentation

## 2022-10-18 DIAGNOSIS — W19XXXA Unspecified fall, initial encounter: Secondary | ICD-10-CM | POA: Insufficient documentation

## 2022-10-18 DIAGNOSIS — R0602 Shortness of breath: Secondary | ICD-10-CM | POA: Insufficient documentation

## 2022-10-18 DIAGNOSIS — I10 Essential (primary) hypertension: Secondary | ICD-10-CM | POA: Insufficient documentation

## 2022-10-18 DIAGNOSIS — Z86711 Personal history of pulmonary embolism: Secondary | ICD-10-CM | POA: Insufficient documentation

## 2022-10-18 DIAGNOSIS — R1011 Right upper quadrant pain: Secondary | ICD-10-CM | POA: Diagnosis not present

## 2022-10-18 DIAGNOSIS — S0990XA Unspecified injury of head, initial encounter: Secondary | ICD-10-CM | POA: Insufficient documentation

## 2022-10-18 DIAGNOSIS — Z7901 Long term (current) use of anticoagulants: Secondary | ICD-10-CM | POA: Diagnosis not present

## 2022-10-18 DIAGNOSIS — Z91040 Latex allergy status: Secondary | ICD-10-CM | POA: Diagnosis not present

## 2022-10-18 DIAGNOSIS — R079 Chest pain, unspecified: Secondary | ICD-10-CM

## 2022-10-18 DIAGNOSIS — R0789 Other chest pain: Secondary | ICD-10-CM | POA: Diagnosis not present

## 2022-10-18 DIAGNOSIS — R197 Diarrhea, unspecified: Secondary | ICD-10-CM | POA: Diagnosis not present

## 2022-10-18 DIAGNOSIS — R110 Nausea: Secondary | ICD-10-CM

## 2022-10-18 DIAGNOSIS — M542 Cervicalgia: Secondary | ICD-10-CM | POA: Diagnosis not present

## 2022-10-18 LAB — COMPREHENSIVE METABOLIC PANEL
ALT: 14 U/L (ref 0–44)
AST: 19 U/L (ref 15–41)
Albumin: 3.1 g/dL — ABNORMAL LOW (ref 3.5–5.0)
Alkaline Phosphatase: 53 U/L (ref 38–126)
Anion gap: 10 (ref 5–15)
BUN: 18 mg/dL (ref 6–20)
CO2: 24 mmol/L (ref 22–32)
Calcium: 8.3 mg/dL — ABNORMAL LOW (ref 8.9–10.3)
Chloride: 107 mmol/L (ref 98–111)
Creatinine, Ser: 0.95 mg/dL (ref 0.44–1.00)
GFR, Estimated: >60 mL/min (ref >60)
Glucose, Bld: 93 mg/dL (ref 70–99)
Potassium: 3.5 mmol/L (ref 3.5–5.1)
Sodium: 141 mmol/L (ref 135–145)
Total Bilirubin: 0.4 mg/dL (ref 0.3–1.2)
Total Protein: 6.7 g/dL (ref 6.5–8.1)

## 2022-10-18 LAB — LIPASE, BLOOD: Lipase: 25 U/L (ref 11–51)

## 2022-10-18 LAB — CBC WITH DIFFERENTIAL/PLATELET
Abs Immature Granulocytes: 0.03 10*3/uL (ref 0.00–0.07)
Basophils Absolute: 0 10*3/uL (ref 0.0–0.1)
Basophils Relative: 0 %
Eosinophils Absolute: 0.1 10*3/uL (ref 0.0–0.5)
Eosinophils Relative: 1 %
HCT: 35.2 % — ABNORMAL LOW (ref 36.0–46.0)
Hemoglobin: 11 g/dL — ABNORMAL LOW (ref 12.0–15.0)
Immature Granulocytes: 1 %
Lymphocytes Relative: 19 %
Lymphs Abs: 1.2 10*3/uL (ref 0.7–4.0)
MCH: 28.9 pg (ref 26.0–34.0)
MCHC: 31.3 g/dL (ref 30.0–36.0)
MCV: 92.4 fL (ref 80.0–100.0)
Monocytes Absolute: 1.2 10*3/uL — ABNORMAL HIGH (ref 0.1–1.0)
Monocytes Relative: 20 %
Neutro Abs: 3.7 10*3/uL (ref 1.7–7.7)
Neutrophils Relative %: 59 %
Platelets: 209 10*3/uL (ref 150–400)
RBC: 3.81 MIL/uL — ABNORMAL LOW (ref 3.87–5.11)
RDW: 14.4 % (ref 11.5–15.5)
WBC: 6.2 10*3/uL (ref 4.0–10.5)
nRBC: 0 % (ref 0.0–0.2)

## 2022-10-18 LAB — TROPONIN I (HIGH SENSITIVITY): Troponin I (High Sensitivity): 3 ng/L (ref <18)

## 2022-10-18 NOTE — ED Provider Notes (Signed)
[place] EM[facility]GENC[facility] DEPARTMENT AT [facility] Provider Note   CSN: [id number] Arrival date & time: 10/18/22  2104     History {Add pertinent medical, surgical, social history, OB history to HPI:1} Chief Complaint  Patient presents with   Chest Pain   Fall    [name] is a 57 y.o. female.  HPI     Home Medications Prior to Admission medications   Medication Sig Start Date End Date Taking? Authorizing Provider  acetaminophen (TYLENOL) 650 MG CR tablet Take 1,300 mg by mouth as needed for pain.    [provider]  albuterol (VENTOLIN HFA) 108 (90 Base) MCG/ACT inhaler Inhale 2 puffs into the lungs every 6 (six) hours as needed for wheezing. 12/20/19   Whitmire, Thermon Leyland, FNP  apixaban (ELIQUIS) 5 MG TABS tablet Take 2 tablets (10 mg total) by mouth 2 (two) times daily for 4 days, THEN 1 tablet (5 mg total) 2 (two) times daily for 21 days. Patient taking differently: Take 5 mg by mouth once daily  06/04/22 10/02/22  Erick Alley, DO  Ascorbic Acid (VITAMIN C PO) Take 1 tablet by mouth daily.    [provider]  azelastine (ASTELIN) 0.1 % nasal spray Place 2 sprays into both nostrils daily as needed for allergies. Use in each nostril as directed    [provider]  BIOTIN PO Take 1 capsule by mouth daily.    [provider]  botulinum toxin Type A (BOTOX) 200 units injection Provider to inject 155 units into the muscles of the head and neck every 12 weeks. Discard remainder. 10/05/22   Butch Penny, NP  calcium carbonate (TUMS - DOSED IN MG ELEMENTAL CALCIUM) 500 MG chewable tablet Chew 1,000 mg by mouth as needed for indigestion or heartburn.    [provider]  cetirizine (ZYRTEC) 10 MG tablet Take 10 mg by mouth daily. 10/20/21   [provider]  Cyanocobalamin (VITAMIN B-12 PO) Take 1 capsule by mouth daily.    [provider]  diclofenac Sodium (VOLTAREN) 1 % GEL Apply 4 g topically 4 (four) times  daily. Patient taking differently: Apply 1 Application topically as needed (pain). 06/04/22   Erick Alley, DO  divalproex (DEPAKOTE ER) 500 MG 24 hr tablet Take 2 tablets (1,000 mg total) by mouth at bedtime AND 1 tablet (500 mg total) daily. Patient taking differently: Take 500 mg by mouth once daily at bedtime  08/19/22   Oletta Darter, MD  doxepin (SINEQUAN) 150 MG capsule Take 150 mg by mouth at bedtime. Patient not taking: Reported on 10/02/2022 06/25/22   [provider]  EPINEPHrine (EPIPEN 2-PAK) 0.3 mg/0.3 mL IJ SOAJ injection Inject 0.3 mg into the muscle as needed for anaphylaxis.    [provider]  fluticasone (FLONASE) 50 MCG/ACT nasal spray Place 1 spray into both nostrils as needed (allergies/runny nose). 06/04/22   Erick Alley, DO  Galcanezumab-gnlm (EMGALITY) 120 MG/ML SOAJ INJECT 120 MG INTO THE SKIN EVERY 30 DAYS Patient taking differently: Inject 120 mg into the skin every 30 (thirty) days. 07/26/22   Anson Fret, MD  lidocaine (LIDODERM) 5 % Place 1 patch onto the skin daily as needed (pain).    [provider]  Melatonin 10 MG CHEW Chew 30 mg by mouth at bedtime as needed (sleep).    [provider]  montelukast (SINGULAIR) 10 MG tablet Take 10 mg by mouth at bedtime as needed (allergies). 08/07/19   [provider]  Multiple Vitamin (MULTIVITAMIN) tablet Take 1 tablet by mouth in the morning.    [provider]  naloxone [facility]) nasal spray 4 mg/0.1 mL Place 0.4 mg into the nose once. 06/19/18   [provider]  NUCYNTA 75 MG tablet Take 75 mg by mouth 2 (two) times daily as needed for moderate pain or severe pain.    [provider]  oxyCODONE (OXY IR/ROXICODONE) 5 MG immediate release tablet Take 1 tablet (5 mg total) by mouth every 6 (six) hours as needed for breakthrough pain. 10/06/22   Maczis, Elmer Sow, PA-C  Rimegepant Sulfate (NURTEC) 75 MG TBDP Take 1 tablet (75 mg total) by mouth daily. Patient  taking differently: Take 75 mg by mouth as needed (migraines). 08/05/22   Anson Fret, MD  tirzepatide [facility]) 7.5 MG/0.5ML Pen Inject 7.5 mg into the skin once a week.    [provider]      Allergies    Bee venom, Coconut (cocos nucifera), Coconut fatty acids, Coconut oil, Food, Mixed ragweed, Molds & smuts, Mushroom ext cmplx(shiitake-reishi-mait), Nutritional supplements, Shellfish allergy, Strawberry extract, Oxycodone, Latex, and Norco [hydrocodone-acetaminophen]    Review of Systems   Review of Systems  Physical Exam Updated Vital Signs BP 112/81   Pulse 92   Temp 98.2 F (36.8 C)   Resp (!) 21   Wt 88.9 kg   SpO2 100%   BMI 31.64 kg/m  Physical Exam  ED Results / Procedures / Treatments   Labs (all labs ordered are listed, but only abnormal results are displayed) Labs Reviewed  COMPREHENSIVE METABOLIC PANEL - Abnormal; Notable for the following components:      Result Value   Calcium 8.3 (*)    Albumin 3.1 (*)    All other components within normal limits  CBC WITH DIFFERENTIAL/PLATELET - Abnormal; Notable for the following components:   RBC 3.81 (*)    Hemoglobin 11.0 (*)    HCT 35.2 (*)    Monocytes Absolute 1.2 (*)    All other components within normal limits  LIPASE, BLOOD  TROPONIN I (HIGH SENSITIVITY)  TROPONIN I (HIGH SENSITIVITY)    EKG EKG Interpretation  Date/Time:  Monday Oct 18 2022 22:06:18 EDT Ventricular Rate:  93 PR Interval:  53 QRS Duration: 111 QT Interval:  352 QTC Calculation: 438 R Axis:   -22 Text Interpretation: Sinus rhythm Short PR interval Right atrial enlargement Abnormal R-wave progression, late transition LVH with secondary repolarization abnormality Artifact in lead(s) I III aVR aVL aVF Confirmed by Vonita Moss [phone]) on 10/18/2022 11:21:24 PM  Radiology No results found.  Procedures Procedures  {Document cardiac monitor, telemetry assessment procedure when appropriate:1}  Medications Ordered in  ED Medications - No data to display  ED Course/ Medical Decision Making/ A&P   {   Click here for ABCD2, HEART and other calculatorsREFRESH Note before signing :1}                          Medical Decision Making Amount and/or Complexity of Data Reviewed Labs: ordered. Radiology: ordered.   ***  {Document critical care time when appropriate:1} {Document review of labs and clinical decision tools ie heart score, Chads2Vasc2 etc:1}  {Document your independent review of radiology images, and any outside records:1} {Document your discussion with family members, caretakers, and with consultants:1} {Document social determinants of health affecting pt's care:1} {Document your decision making why or why not admission, treatments were needed:1} Final Clinical Impression(s) /  ED Diagnoses Final diagnoses:  None    Rx / DC Orders ED Discharge Orders     None

## 2022-10-18 NOTE — ED Triage Notes (Signed)
Pt with recent hospitalization, with gallbladder removal and d/c on 5/15. Arrives today with pain to chest and RUQ, also states she has been having frequent falls due to weakness at home - takes Eliquis. Last fell this afternoon, onto a dog kennel then floor. States she had LOC with recent fall on Wednesday. Spouse states she has been slurring her words since then. C/o pain to L side when she walks.

## 2022-10-19 ENCOUNTER — Emergency Department (HOSPITAL_COMMUNITY): Payer: 59

## 2022-10-19 DIAGNOSIS — S0990XA Unspecified injury of head, initial encounter: Secondary | ICD-10-CM | POA: Diagnosis not present

## 2022-10-19 LAB — TROPONIN I (HIGH SENSITIVITY): Troponin I (High Sensitivity): 2 ng/L (ref <18)

## 2022-10-19 MED ORDER — IOHEXOL 350 MG/ML SOLN
140.0000 mL | Freq: Once | INTRAVENOUS | Status: AC | PRN
  Administered 2022-10-19: 140 mL via INTRAVENOUS

## 2022-10-19 NOTE — ED Provider Notes (Signed)
  Provider Note MRN:  [id number]  Arrival date & time: 10/19/22    ED Course and Medical Decision Making  Assumed care from Dr. Jarold Motto at shift change.  Patient presenting with multiple complaints including fall, chest pain, abdominal pain awaiting CT imaging.  Workup is reassuring with no emergent process.  Patient is excited to go home, has follow-up tomorrow. Procedures  Final Clinical Impressions(s) / ED Diagnoses     ICD-10-CM   1. Chest pain, unspecified type  R07.9     2. Right upper quadrant abdominal pain  R10.11     3. Nausea  R11.0     4. Fall, initial encounter  W19.XXXA     5. Injury of head, initial encounter  S09.90XA     6. Neck pain  M54.2       ED Discharge Orders     None         Discharge Instructions      You were evaluated in the Emergency Department and after careful evaluation, we did not find any emergent condition requiring admission or further testing in the hospital.  Your exam/testing today was overall reassuring.  Follow-up with your regular doctors to discuss your symptoms.  Please return to the Emergency Department if you experience any worsening of your condition.  Thank you for allowing [name] to be a part of your care.       Elmer Sow. Pilar Plate, MD [facility] Emergency Medicine [facility] [email or web address]    Sabas Sous, MD 10/19/22 0230

## 2022-10-19 NOTE — Discharge Instructions (Signed)
You were evaluated in the Emergency Department and after careful evaluation, we did not find any emergent condition requiring admission or further testing in the hospital.  Your exam/testing today was overall reassuring.  Follow-up with your regular doctors to discuss your symptoms.  Please return to the Emergency Department if you experience any worsening of your condition.  Thank you for allowing us to be a part of your care.  

## 2022-10-26 ENCOUNTER — Ambulatory Visit: Payer: 59 | Admitting: Adult Health

## 2022-11-04 ENCOUNTER — Ambulatory Visit (INDEPENDENT_AMBULATORY_CARE_PROVIDER_SITE_OTHER): Payer: 59 | Admitting: Adult Health

## 2022-11-04 DIAGNOSIS — G43711 Chronic migraine without aura, intractable, with status migrainosus: Secondary | ICD-10-CM

## 2022-11-04 MED ORDER — ONABOTULINUMTOXINA 200 UNITS IJ SOLR
155.0000 [IU] | Freq: Once | INTRAMUSCULAR | Status: AC
Start: 2022-11-04 — End: 2022-11-04
  Administered 2022-11-04: 155 [IU] via INTRAMUSCULAR

## 2022-11-04 NOTE — Progress Notes (Signed)
Botox- 200 units x 1 vial Lot: [id number] Expiration: 01/2025 NDC: 4259-5638-75  Bacteriostatic 0.9% Sodium Chloride-  4 mL  Lot: [id number] Expiration: 08/23/2023 NDC: 5188-4166-06  Dx: T01.601 S/P  Witnessed by Delmer Islam

## 2022-11-04 NOTE — Progress Notes (Signed)
11/04/22: Botox continues to work well. Still has to take xanax before her injections. Significant other is with her today.    07/26/22: Reports that she presently gets 2 migraines a week.  Botox has been extremely helpful for her.  She does have to take Xanax before her injections.  04/19/2022: On botox only gets 4 migraines a month. IS VERY ANXIOUS TAKES XANAX before coming in be very gentle with injections   01/19/2022: Super anxious, but doing very well with botox, > 50% improvement in migraine freq and severity. Gave xanax to use prior to injections, discussed risks of taking xanax with nucynta including resp failure and death, discussed take one tablet 30-60 minutes before botox procedure, do not take at any other time, she undertood and agreed. will discuss disposing of any unused medication at next appointment     10/27/2021: stable doing well. She lost 30 pounds, her hair looks great, she had an episode of dizziness and weakness wasn;t eating or drinking after a breakup, she has had some falls due to right leg weakness pending mri lumbar spine we wll see her on the 22nd.     07/21/2021: She gets > 70% relief migraine frequency, extremely anxious, do the botox fast, +a   04/21/2021: stable, still doing same if not better > 60% improvement. She broke up with her boyfriend. Patient states she did not take the xanax, she is here alone, I warned her not to drive if she takes xanax or clonazepam and have someone drive her.    06/23/8655: getting back on track. Will give some xanax for next time she is incredibly stressed when she comes in, she is lovely, here with fiance   Interval history 04/28/2020: Doing great, More than 60% improvement in frequency but also in severity. Ajovy not approve using emgality instead. she has failed imitrex and maxalt and cannot take anymore triptans, contraindicated due to side effects, gave her some samples. She feels clenching is a problem and she has pain when  clenching in the temples as well and we give her muscle relaxers since we cannot put botox there. She had HTN on aimovig, stopped. She had botox in the past and we restarted on 03/2019, doing great. Loves emgality as an add on as well. No date set for her wedding yet, it has been 2.5 years. She is very anxious and took anxiety medication today and did better but she is very anxious. She wants an outdoor wedding. She did much better, she took her anxiety and pain meds. [facility]    BOTOX PROCEDURE NOTE FOR MIGRAINE HEADACHE    Contraindications and precautions discussed with patient(above). Aseptic procedure was observed and patient tolerated procedure. Procedure performed by Butch Penny, NP  The condition has existed for more than 6 months, and pt does not have a diagnosis of ALS, Myasthenia Gravis or Lambert-Eaton Syndrome.  Risks and benefits of injections discussed and pt agrees to proceed with the procedure.  Written consent obtained  These injections are medically necessary. These injections do not cause sedations or hallucinations which the oral therapies may cause.  Indication/Diagnosis: chronic migraine BOTOX(J0585) injection was performed according to protocol by [employer]. 200 units of BOTOX was dissolved into 4 cc NS.   NDC: [address]-84  Type of toxin: Botox  Botox- 200 units x 1 vial Lot: [id number] Expiration: 01/2025 NDC: 0272-5366-44   Bacteriostatic 0.9% Sodium Chloride-  4 mL  Lot: [id number] Expiration:  08/23/2023 NDC: 9562-1308-65   Dx: H84.696     Description of procedure:  The patient was placed in a sitting position. The standard protocol was used for Botox as follows, with 5 units of Botox injected at each site:   -Procerus muscle, midline injection  -Corrugator muscle, bilateral injection  -Frontalis muscle, bilateral injection, with 2 sites each side, medial injection was performed in the upper one  third of the frontalis muscle, in the region vertical from the medial inferior edge of the superior orbital rim. The lateral injection was again in the upper one third of the forehead vertically above the lateral limbus of the cornea, 1.5 cm lateral to the medial injection site.  -Temporalis muscle injection, 4 sites, bilaterally. The first injection was 3 cm above the tragus of the ear, second injection site was 1.5 cm to 3 cm up from the first injection site in line with the tragus of the ear. The third injection site was 1.5-3 cm forward between the first 2 injection sites. The fourth injection site was 1.5 cm posterior to the second injection site.  -Occipitalis muscle injection, 3 sites, bilaterally. The first injection was done one half way between the occipital protuberance and the tip of the mastoid process behind the ear. The second injection site was done lateral and superior to the first, 1 fingerbreadth from the first injection. The third injection site was 1 fingerbreadth superiorly and medially from the first injection site.  -Cervical paraspinal muscle injection, 2 sites, bilateral knee first injection site was 1 cm from the midline of the cervical spine, 3 cm inferior to the lower border of the occipital protuberance. The second injection site was 1.5 cm superiorly and laterally to the first injection site.  -Trapezius muscle injection was performed at 3 sites, bilaterally. The first injection site was in the upper trapezius muscle halfway between the inflection point of the neck, and the acromion. The second injection site was one half way between the acromion and the first injection site. The third injection was done between the first injection site and the inflection point of the neck.   Will return for repeat injection in 3 months.   A 200 units of Botox was used, 155 units were injected, the rest of the Botox was wasted. The patient tolerated the procedure well, there were no  complications of the above procedure.  Butch Penny, MSN, NP-C 11/04/2022, 3:29 PM [facility] [address] [place], [place] [address] [phone]

## 2022-11-11 ENCOUNTER — Telehealth (HOSPITAL_BASED_OUTPATIENT_CLINIC_OR_DEPARTMENT_OTHER): Payer: 59 | Admitting: Psychiatry

## 2022-11-11 DIAGNOSIS — F3181 Bipolar II disorder: Secondary | ICD-10-CM

## 2022-11-11 DIAGNOSIS — F4001 Agoraphobia with panic disorder: Secondary | ICD-10-CM | POA: Diagnosis not present

## 2022-11-11 DIAGNOSIS — G4700 Insomnia, unspecified: Secondary | ICD-10-CM

## 2022-11-11 DIAGNOSIS — F411 Generalized anxiety disorder: Secondary | ICD-10-CM

## 2022-11-11 DIAGNOSIS — Z5181 Encounter for therapeutic drug level monitoring: Secondary | ICD-10-CM

## 2022-11-11 MED ORDER — DIVALPROEX SODIUM ER 500 MG PO TB24: ORAL_TABLET | ORAL | 0 refills | Status: DC

## 2022-11-11 NOTE — Progress Notes (Signed)
Virtual Visit via Video Note  I connected with [name] on 11/11/22 at  2:00 PM EDT by a video enabled telemedicine application and verified that I am speaking with the correct person using two identifiers.  Location: Patient: Home Provider: office   I discussed the limitations of evaluation and management by telemedicine and the availability of in person appointments. The patient expressed understanding and agreed to proceed.  History of Present Illness: Merrill shares her mood has been "up and down and everywhere". Her depression is getting worse over the last 5 weeks. Today is the 4th day she has not left the house or changed her clothes. Everything is getting on her nerves and she is stressed. Dafna is able to fall asleep but wakes up every few hours. She stays in bed until 3-4pm. Her appetite is ok. Her energy and motivation are low. She is endorsing anhedonia and isolation. She denies SI/HI. Ijanae's anxiety is much worse. She is pacing and moving around a lot. She is having weird dreams. Pria's memory is poor and she is repeating herself. Corin is unable to relax. She has had 2 panic attacks since 5/28. Lindey is endorsing  some psychomotor retardation - 2 times in the last few weeks. She is "passing out" more. She has fallen 4 times in the last few weeks. Samay is working with her PCP and other doctors. Kyleigha is concerned about working with a new psychiatrist and having to start over.   Observations/Objective: Psychiatric Specialty Exam: ROS  There were no vitals taken for this visit.There is no height or weight on file to calculate BMI.  General Appearance: Disheveled  Eye Contact:  Fair  Speech:  Clear and Coherent and Normal Rate  Volume:  Normal  Mood:  Anxious and Depressed  Affect:  Congruent  Thought Process:  Coherent and Descriptions of Associations: Intact  Orientation:  Full (Time, Place, and Person)  Thought Content:  Logical  Suicidal Thoughts:  No   Homicidal Thoughts:  No  Memory:  Immediate;   Good  Judgement:  Fair  Insight:  Fair  Psychomotor Activity:  Normal  Concentration:  Concentration: Fair  Recall:  Fair  Fund of Knowledge:  Good  Language:  Good  Akathisia:  No  Handed:  Right  AIMS (if indicated):     Assets:  Communication Skills Desire for Improvement Financial Resources/Insurance Resilience Social Support Talents/Skills Transportation Vocational/Educational  ADL's:  Intact  Cognition:  WNL  Sleep:        Assessment and Plan:     11/11/2022    2:14 PM 07/22/2022    3:31 PM 05/20/2022    1:14 PM 03/18/2022    1:19 PM 12/31/2021    1:41 PM  Depression screen PHQ 2/9  Decreased Interest 3 3 0 3 2  Down, Depressed, Hopeless 3 3 2 2 2   PHQ - 2 Score 6 6 2 5 4   Altered sleeping 3 2 0 3 3  Tired, decreased energy 3 3 3 3 3   Change in appetite 0 3 3 3 1   Feeling bad or failure about yourself  1 3 3 2 2   Trouble concentrating 3 3 1 2 2   Moving slowly or fidgety/restless 1 0 1 0 2  Suicidal thoughts 0 0 1 0 0  PHQ-9 Score 17 20 14 18 17   Difficult doing work/chores Very difficult Extremely dIfficult Very difficult Very difficult Extremely dIfficult    Flowsheet Row Video [facility] from 10/18/2022 in [facility] Emergency Department at [facility] ED to Hosp-Admission (Discharged) from 10/02/2022 in [facility] GENERAL MED/SURG UNIT  C-SSRS RISK CATEGORY No Risk No Risk No Risk          Pt is aware that these meds carry a teratogenic risk. Pt will discuss plan of action if she does or plans to become pregnant in the future.  Status of current problems: worsening depression. Ongoing anxiety and sleep issues. She has a number of health problems that contribute to her mood and anxiety. She has been on a large number of antidepressants in the past and most were ineffective.    Medication management with supportive therapy.  Risks and benefits, side effects and alternative treatment options discussed with patient. Pt was given an opportunity to ask questions about medication, illness, and treatment. All current psychiatric medications have been reviewed and discussed with the patient and adjusted as clinically appropriate.  Pt verbalized understanding and verbal consent obtained for treatment.  Meds: d/c Doxepin due to falls.  Continue Depakote 1. Bipolar II disorder (HCC) - divalproex (DEPAKOTE ER) 500 MG 24 hr tablet; Take 2 tablets (1,000 mg total) by mouth at bedtime AND 1 tablet (500 mg total) daily.  Dispense: 270 tablet; Refill: 0  2. Insomnia, unspecified type  3. GAD (generalized anxiety disorder)  4. Panic disorder with agoraphobia  5. Encounter for therapeutic drug monitoring - Valproic acid level - CBC with Differential - Comprehensive Metabolic Panel (CMET) She will come to the office to have labs drawn.    Therapy: brief supportive therapy provided. Discussed psychosocial stressors in detail.     Collaboration of Care: Other none  Patient/Guardian was advised Release of Information must be obtained prior to any record release in order to collaborate their care with an outside provider. Patient/Guardian was advised if they have not already done so to contact the registration department to sign all necessary forms in order for [name] to release information regarding their care.   Consent: Patient/Guardian gives verbal consent for treatment and assignment of benefits for services provided during this visit. Patient/Guardian expressed understanding and agreed to proceed.       Follow Up Instructions: Follow up in 1-2 months or sooner if needed with a new psychiatrist  Patient informed that I am leaving [facility] in 11/2022 and I relayed that they will be getting a new provider after that. Patient verbalized understanding and agreed with the plan.     I discussed the assessment and treatment plan  with the patient. The patient was provided an opportunity to ask questions and all were answered. The patient agreed with the plan and demonstrated an understanding of the instructions.   The patient was advised to call back or seek an in-person evaluation if the symptoms worsen or if the condition fails to improve as anticipated.  I provided 19 minutes of non-face-to-face time during this encounter.   Oletta Darter, MD

## 2023-01-05 ENCOUNTER — Emergency Department (HOSPITAL_COMMUNITY)
Admission: EM | Admit: 2023-01-05 | Discharge: 2023-01-05 | Disposition: A | Discharge status: Home / Self Care | Payer: 59 | Attending: Emergency Medicine | Admitting: Emergency Medicine

## 2023-01-05 ENCOUNTER — Emergency Department (HOSPITAL_COMMUNITY): Payer: 59

## 2023-01-05 ENCOUNTER — Encounter (HOSPITAL_COMMUNITY): Payer: Self-pay

## 2023-01-05 ENCOUNTER — Other Ambulatory Visit: Payer: Self-pay

## 2023-01-05 DIAGNOSIS — M25562 Pain in left knee: Secondary | ICD-10-CM | POA: Insufficient documentation

## 2023-01-05 DIAGNOSIS — R4182 Altered mental status, unspecified: Secondary | ICD-10-CM | POA: Diagnosis not present

## 2023-01-05 DIAGNOSIS — I10 Essential (primary) hypertension: Secondary | ICD-10-CM | POA: Diagnosis not present

## 2023-01-05 DIAGNOSIS — R531 Weakness: Secondary | ICD-10-CM | POA: Insufficient documentation

## 2023-01-05 DIAGNOSIS — Z91040 Latex allergy status: Secondary | ICD-10-CM | POA: Insufficient documentation

## 2023-01-05 DIAGNOSIS — M545 Low back pain, unspecified: Secondary | ICD-10-CM | POA: Diagnosis not present

## 2023-01-05 DIAGNOSIS — W19XXXA Unspecified fall, initial encounter: Secondary | ICD-10-CM | POA: Insufficient documentation

## 2023-01-05 DIAGNOSIS — M25561 Pain in right knee: Secondary | ICD-10-CM | POA: Insufficient documentation

## 2023-01-05 DIAGNOSIS — E119 Type 2 diabetes mellitus without complications: Secondary | ICD-10-CM | POA: Diagnosis not present

## 2023-01-05 DIAGNOSIS — Z7901 Long term (current) use of anticoagulants: Secondary | ICD-10-CM | POA: Diagnosis not present

## 2023-01-05 DIAGNOSIS — R471 Dysarthria and anarthria: Secondary | ICD-10-CM | POA: Insufficient documentation

## 2023-01-05 DIAGNOSIS — J45909 Unspecified asthma, uncomplicated: Secondary | ICD-10-CM | POA: Diagnosis not present

## 2023-01-05 LAB — CK: Total CK: 60 U/L (ref 38–234)

## 2023-01-05 LAB — URINALYSIS, ROUTINE W REFLEX MICROSCOPIC
Bilirubin Urine: NEGATIVE
Glucose, UA: NEGATIVE mg/dL
Hgb urine dipstick: NEGATIVE
Ketones, ur: NEGATIVE mg/dL
Leukocytes,Ua: NEGATIVE
Nitrite: NEGATIVE
Protein, ur: NEGATIVE mg/dL
Specific Gravity, Urine: 1.016 (ref 1.005–1.030)
pH: 7 (ref 5.0–8.0)

## 2023-01-05 LAB — CBC
HCT: 37.2 % (ref 36.0–46.0)
Hemoglobin: 11.4 g/dL — ABNORMAL LOW (ref 12.0–15.0)
MCH: 28.8 pg (ref 26.0–34.0)
MCHC: 30.6 g/dL (ref 30.0–36.0)
MCV: 93.9 fL (ref 80.0–100.0)
Platelets: 166 10*3/uL (ref 150–400)
RBC: 3.96 MIL/uL (ref 3.87–5.11)
RDW: 14.4 % (ref 11.5–15.5)
WBC: 4.6 10*3/uL (ref 4.0–10.5)
nRBC: 0 % (ref 0.0–0.2)

## 2023-01-05 LAB — COMPREHENSIVE METABOLIC PANEL
ALT: 13 U/L (ref 0–44)
AST: 22 U/L (ref 15–41)
Albumin: 3.2 g/dL — ABNORMAL LOW (ref 3.5–5.0)
Alkaline Phosphatase: 57 U/L (ref 38–126)
Anion gap: 11 (ref 5–15)
BUN: 15 mg/dL (ref 6–20)
CO2: 22 mmol/L (ref 22–32)
Calcium: 8.7 mg/dL — ABNORMAL LOW (ref 8.9–10.3)
Chloride: 109 mmol/L (ref 98–111)
Creatinine, Ser: 1.15 mg/dL — ABNORMAL HIGH (ref 0.44–1.00)
GFR, Estimated: 55 mL/min — ABNORMAL LOW (ref >60)
Glucose, Bld: 86 mg/dL (ref 70–99)
Potassium: 3.4 mmol/L — ABNORMAL LOW (ref 3.5–5.1)
Sodium: 142 mmol/L (ref 135–145)
Total Bilirubin: <0.1 mg/dL — ABNORMAL LOW (ref 0.3–1.2)
Total Protein: 6.6 g/dL (ref 6.5–8.1)

## 2023-01-05 LAB — I-STAT CHEM 8, ED
BUN: 16 mg/dL (ref 6–20)
Calcium, Ion: 1.12 mmol/L — ABNORMAL LOW (ref 1.15–1.40)
Chloride: 109 mmol/L (ref 98–111)
Creatinine, Ser: 1 mg/dL (ref 0.44–1.00)
Glucose, Bld: 82 mg/dL (ref 70–99)
HCT: 36 % (ref 36.0–46.0)
Hemoglobin: 12.2 g/dL (ref 12.0–15.0)
Potassium: 3.3 mmol/L — ABNORMAL LOW (ref 3.5–5.1)
Sodium: 144 mmol/L (ref 135–145)
TCO2: 27 mmol/L (ref 22–32)

## 2023-01-05 LAB — ETHANOL: Alcohol, Ethyl (B): <10 mg/dL (ref <10)

## 2023-01-05 LAB — MAGNESIUM: Magnesium: 2.2 mg/dL (ref 1.7–2.4)

## 2023-01-05 LAB — PROTIME-INR
INR: 1 (ref 0.8–1.2)
Prothrombin Time: 13.4 seconds (ref 11.4–15.2)

## 2023-01-05 LAB — I-STAT CG4 LACTIC ACID, ED: Lactic Acid, Venous: 2.3 mmol/L (ref 0.5–1.9)

## 2023-01-05 LAB — AMMONIA: Ammonia: <10 umol/L (ref 9–35)

## 2023-01-05 MED ORDER — LACTATED RINGERS IV BOLUS
1000.0000 mL | Freq: Once | INTRAVENOUS | Status: AC
  Administered 2023-01-05: 1000 mL via INTRAVENOUS

## 2023-01-05 MED ORDER — DIPHENHYDRAMINE HCL 50 MG/ML IJ SOLN
12.5000 mg | Freq: Once | INTRAMUSCULAR | Status: AC
  Administered 2023-01-05: 12.5 mg via INTRAVENOUS
  Filled 2023-01-05: qty 1

## 2023-01-05 MED ORDER — TAPENTADOL HCL 50 MG PO TABS
75.0000 mg | ORAL_TABLET | Freq: Once | ORAL | Status: AC
  Administered 2023-01-05: 75 mg via ORAL
  Filled 2023-01-05: qty 2

## 2023-01-05 MED ORDER — METOCLOPRAMIDE HCL 5 MG/ML IJ SOLN
10.0000 mg | Freq: Once | INTRAMUSCULAR | Status: AC
  Administered 2023-01-05: 10 mg via INTRAVENOUS
  Filled 2023-01-05: qty 2

## 2023-01-05 MED ORDER — GADOBUTROL 1 MMOL/ML IV SOLN
7.5000 mL | Freq: Once | INTRAVENOUS | Status: AC | PRN
  Administered 2023-01-05: 7.5 mL via INTRAVENOUS

## 2023-01-05 MED ORDER — POTASSIUM CHLORIDE CRYS ER 20 MEQ PO TBCR
40.0000 meq | EXTENDED_RELEASE_TABLET | Freq: Once | ORAL | Status: AC
  Administered 2023-01-05: 40 meq via ORAL
  Filled 2023-01-05: qty 2

## 2023-01-05 NOTE — ED Provider Notes (Signed)
[place] EMERGENCY DEPARTMENT AT [facility] Provider Note   CSN: [id number] Arrival date & time: 01/05/23  1224     History  Chief Complaint  Patient presents with   [name]    [name] is a 57 y.o. female.  HPI Patient presents for fall and altered mental status.  Medical history includes anemia, arthritis, asthma, bipolar disorder, chronic back pain, HTN, fibromyalgia, anxiety, migraine headaches, depression, T2DM.  She lives with her son.  She had a fall several days ago.  Son felt like she was not quite herself last night.  This morning, when he checked on her she was laying in the bed.  Later in the morning, she called EMS herself from the floor.  She had suffered a ground-level fall and was unable to get up.  Patient feels that she fell both times due to loss of balance.  She reports that she walks with a cane at baseline.  She feels like she would be able to get up from the floor on a good day.  She describes a generalized weakness today that prevented her from getting up.  When EMS arrived on scene, they noted some difficulty with speech.  They did not notice any unilateral weakness.  She is reportedly on Eliquis.  She states that she has not taken this in several weeks.  Per chart review, she had a office appointment 3 weeks ago.  Eliquis was not noted on her med list at the time.  Currently, patient endorses pain in bilateral knees and lower back.  She denies any other areas of discomfort.    Home Medications Prior to Admission medications   Medication Sig Start Date End Date Taking? Authorizing Provider  acetaminophen (TYLENOL) 650 MG CR tablet Take 1,300 mg by mouth as needed for pain.    [provider]  albuterol (VENTOLIN HFA) 108 (90 Base) MCG/ACT inhaler Inhale 2 puffs into the lungs every 6 (six) hours as needed for wheezing. 12/20/19   Whitmire, Thermon Leyland, FNP  apixaban (ELIQUIS) 5 MG TABS tablet Take 2 tablets (10 mg total) by mouth 2 (two) times  daily for 4 days, THEN 1 tablet (5 mg total) 2 (two) times daily for 21 days. Patient taking differently: Take 5 mg by mouth once daily  06/04/22 11/11/22  Erick Alley, DO  Ascorbic Acid (VITAMIN C PO) Take 1 tablet by mouth daily. Patient not taking: Reported on 11/11/2022    [provider]  azelastine (ASTELIN) 0.1 % nasal spray Place 2 sprays into both nostrils daily as needed for allergies. Use in each nostril as directed    [provider]  BIOTIN PO Take 1 capsule by mouth daily.    [provider]  botulinum toxin Type A (BOTOX) 200 units injection Provider to inject 155 units into the muscles of the head and neck every 12 weeks. Discard remainder. 10/05/22   Butch Penny, NP  calcium carbonate (TUMS - DOSED IN MG ELEMENTAL CALCIUM) 500 MG chewable tablet Chew 1,000 mg by mouth as needed for indigestion or heartburn.    [provider]  cetirizine (ZYRTEC) 10 MG tablet Take 10 mg by mouth daily. Patient not taking: Reported on 11/11/2022 10/20/21   [provider]  Cyanocobalamin (VITAMIN B-12 PO) Take 1 capsule by mouth daily.    [provider]  diclofenac Sodium (VOLTAREN) 1 % GEL Apply 4 g topically 4 (four) times daily. Patient taking differently: Apply 1 Application topically as needed (pain).  06/04/22   Erick Alley, DO  divalproex (DEPAKOTE ER) 500 MG 24 hr tablet Take 2 tablets (1,000 mg total) by mouth at bedtime AND 1 tablet (500 mg total) daily. 11/11/22   Oletta Darter, MD  EPINEPHrine (EPIPEN 2-PAK) 0.3 mg/0.3 mL IJ SOAJ injection Inject 0.3 mg into the muscle as needed for anaphylaxis.    [provider]  fluticasone (FLONASE) 50 MCG/ACT nasal spray Place 1 spray into both nostrils as needed (allergies/runny nose). 06/04/22   Erick Alley, DO  Galcanezumab-gnlm (EMGALITY) 120 MG/ML SOAJ INJECT 120 MG INTO THE SKIN EVERY 30 DAYS Patient taking differently: Inject 120 mg into the skin every 30 (thirty) days. 07/26/22    Anson Fret, MD  lidocaine (LIDODERM) 5 % Place 1 patch onto the skin daily as needed (pain).    [provider]  Melatonin 10 MG CHEW Chew 30 mg by mouth at bedtime as needed (sleep).    [provider]  montelukast (SINGULAIR) 10 MG tablet Take 10 mg by mouth at bedtime as needed (allergies). 08/07/19   [provider]  Multiple Vitamin (MULTIVITAMIN) tablet Take 1 tablet by mouth in the morning.    [provider]  naloxone [facility]) nasal spray 4 mg/0.1 mL Place 0.4 mg into the nose once. 06/19/18   [provider]  NUCYNTA 75 MG tablet Take 75 mg by mouth 2 (two) times daily as needed for moderate pain or severe pain.    [provider]  oxyCODONE (OXY IR/ROXICODONE) 5 MG immediate release tablet Take 1 tablet (5 mg total) by mouth every 6 (six) hours as needed for breakthrough pain. Patient not taking: Reported on 11/11/2022 10/06/22   Jacinto Halim, PA-C  Rimegepant Sulfate (NURTEC) 75 MG TBDP Take 1 tablet (75 mg total) by mouth daily. Patient taking differently: Take 75 mg by mouth as needed (migraines). 08/05/22   Anson Fret, MD  tirzepatide [facility]) 7.5 MG/0.5ML Pen Inject 7.5 mg into the skin once a week.    [provider]      Allergies    Bee venom, Coconut (cocos nucifera), Coconut fatty acids, Coconut oil, Food, Mixed ragweed, Molds & smuts, Mushroom ext cmplx(shiitake-reishi-mait), Nutritional supplements, Shellfish allergy, Strawberry extract, Oxycodone, Latex, and Norco [hydrocodone-acetaminophen]    Review of Systems   Review of Systems  Musculoskeletal:  Positive for arthralgias and back pain.  Neurological:  Positive for speech difficulty.  Psychiatric/Behavioral:  Positive for confusion.   All other systems reviewed and are negative.   Physical Exam Updated Vital Signs BP 123/83   Pulse 89   Temp 98.7 F (37.1 C) (Oral)   Resp 19   Ht 5\' 6"  (1.676 m)   Wt 77.1 kg   SpO2 100%   BMI  27.44 kg/m  Physical Exam Vitals and nursing note reviewed.  Constitutional:      General: She is not in acute distress.    Appearance: Normal appearance. She is well-developed. She is not ill-appearing, toxic-appearing or diaphoretic.  HENT:     Head: Normocephalic and atraumatic.     Right Ear: External ear normal.     Left Ear: External ear normal.     Nose: Nose normal.     Mouth/Throat:     Mouth: Mucous membranes are moist.  Eyes:     Extraocular Movements: Extraocular movements intact.     Conjunctiva/sclera: Conjunctivae normal.  Cardiovascular:     Rate and Rhythm: Normal rate and regular rhythm.  Pulmonary:  Effort: Pulmonary effort is normal. No respiratory distress.     Breath sounds: No wheezing or rales.  Chest:     Chest wall: No tenderness.  Abdominal:     General: There is no distension.     Palpations: Abdomen is soft.     Tenderness: There is no abdominal tenderness.  Musculoskeletal:        General: No swelling or deformity. Normal range of motion.     Cervical back: Normal range of motion and neck supple.  Skin:    General: Skin is warm and dry.     Coloration: Skin is not jaundiced or pale.  Neurological:     Mental Status: She is alert and oriented to person, place, and time.     Cranial Nerves: Dysarthria present. No facial asymmetry.     Sensory: Sensation is intact. No sensory deficit.     Motor: Motor function is intact. No weakness, abnormal muscle tone or pronator drift.     Coordination: Coordination is intact.  Psychiatric:        Mood and Affect: Mood normal.     ED Results / Procedures / Treatments   Labs (all labs ordered are listed, but only abnormal results are displayed) Labs Reviewed  COMPREHENSIVE METABOLIC PANEL - Abnormal; Notable for the following components:      Result Value   Potassium 3.4 (*)    Creatinine, Ser 1.15 (*)    Calcium 8.7 (*)    Albumin 3.2 (*)    Total Bilirubin <0.1 (*)    GFR, Estimated 55 (*)     All other components within normal limits  CBC - Abnormal; Notable for the following components:   Hemoglobin 11.4 (*)    All other components within normal limits  I-STAT CHEM 8, ED - Abnormal; Notable for the following components:   Potassium 3.3 (*)    Calcium, Ion 1.12 (*)    All other components within normal limits  I-STAT CG4 LACTIC ACID, ED - Abnormal; Notable for the following components:   Lactic Acid, Venous 2.3 (*)    All other components within normal limits  ETHANOL  PROTIME-INR  MAGNESIUM  AMMONIA  CK  URINALYSIS, ROUTINE W REFLEX MICROSCOPIC    EKG EKG Interpretation Date/Time:  Wednesday January 05 2023 12:32:36 EDT Ventricular Rate:  96 PR Interval:  194 QRS Duration:  97 QT Interval:  390 QTC Calculation: 493 R Axis:   -23  Text Interpretation: Sinus rhythm Borderline left axis deviation Abnormal R-wave progression, early transition Borderline T abnormalities, anterior leads Borderline prolonged QT interval Confirmed by Gloris Manchester [phone]) on 01/05/2023 2:44:18 PM  Radiology DG Chest Port 1 View  Result Date: 01/05/2023 CLINICAL DATA:  Trauma.  Fall. EXAM: PORTABLE CHEST 1 VIEW COMPARISON:  Chest radiograph 10/05/2022 FINDINGS: The heart is enlarged, unchanged. The upper mediastinal contours are normal. Lung volumes are low. There is no focal consolidation or pulmonary edema. There is no pleural effusion or pneumothorax There is no acute osseous abnormality. No displaced rib fracture is seen. IMPRESSION: Low lung volumes. Otherwise, no radiographic evidence of acute cardiopulmonary process. Electronically Signed   By: Lesia Hausen M.D.   On: 01/05/2023 14:28   DG Pelvis Portable  Result Date: 01/05/2023 CLINICAL DATA:  Trauma, fall EXAM: PORTABLE PELVIS 1-2 VIEWS COMPARISON:  None Available. FINDINGS: There is no evidence of displaced pelvic fracture or diastasis. No pelvic bone lesions are seen. Nonobstructive pattern of overlying bowel gas. IMPRESSION: No  displaced fracture or  dislocation of the pelvis or bilateral proximal femurs seen in single frontal view. Please note that plain radiographs are significantly insensitive for hip and pelvic fracture. Recommend CT to further evaluate if indicated by clinical concern for fracture. Electronically Signed   By: Jearld Lesch M.D.   On: 01/05/2023 14:02   CT HEAD WO CONTRAST  Result Date: 01/05/2023 CLINICAL DATA:  Head trauma, moderate-severe; Polytrauma, blunt EXAM: CT HEAD WITHOUT CONTRAST CT CERVICAL SPINE WITHOUT CONTRAST TECHNIQUE: Multidetector CT imaging of the head and cervical spine was performed following the standard protocol without intravenous contrast. Multiplanar CT image reconstructions of the cervical spine were also generated. RADIATION DOSE REDUCTION: This exam was performed according to the departmental dose-optimization program which includes automated exposure control, adjustment of the mA and/or kV according to patient size and/or use of iterative reconstruction technique. COMPARISON:  CT C Spine 10/19/22, CT head 10/19/22 FINDINGS: CT HEAD FINDINGS Brain: No evidence of acute infarction, hemorrhage, hydrocephalus, extra-axial collection or mass lesion/mass effect. Vascular: No hyperdense vessel or unexpected calcification. Skull: Normal. Negative for fracture or focal lesion. Sinuses/Orbits: No middle ear or mastoid effusion. Paranasal sinuses are clear. Orbits are unremarkable. Other: None. CT CERVICAL SPINE FINDINGS Motion degraded exam. If there is high clinical concern for a cervical spine fracture, a repeat CT of the cervical spine is recommended. Alignment: Trace retrolisthesis of C6 on C7 Skull base and vertebrae: No acute fracture. No primary bone lesion or focal pathologic process. Soft tissues and spinal canal: No prevertebral fluid or swelling. No visible canal hematoma. Disc levels:  No evidence of high-grade spinal canal stenosis. Upper chest: Negative. Other: None IMPRESSION: 1. No  acute intracranial abnormality. 2. Within the limitations of a motion degraded exam, no evidence of an acute cervical spine fracture. If there is high clinical concern for a cervical spine fracture, a repeat CT of the cervical spine is recommended. Electronically Signed   By: Lorenza Cambridge M.D.   On: 01/05/2023 14:02   CT CERVICAL SPINE WO CONTRAST  Result Date: 01/05/2023 CLINICAL DATA:  Head trauma, moderate-severe; Polytrauma, blunt EXAM: CT HEAD WITHOUT CONTRAST CT CERVICAL SPINE WITHOUT CONTRAST TECHNIQUE: Multidetector CT imaging of the head and cervical spine was performed following the standard protocol without intravenous contrast. Multiplanar CT image reconstructions of the cervical spine were also generated. RADIATION DOSE REDUCTION: This exam was performed according to the departmental dose-optimization program which includes automated exposure control, adjustment of the mA and/or kV according to patient size and/or use of iterative reconstruction technique. COMPARISON:  CT C Spine 10/19/22, CT head 10/19/22 FINDINGS: CT HEAD FINDINGS Brain: No evidence of acute infarction, hemorrhage, hydrocephalus, extra-axial collection or mass lesion/mass effect. Vascular: No hyperdense vessel or unexpected calcification. Skull: Normal. Negative for fracture or focal lesion. Sinuses/Orbits: No middle ear or mastoid effusion. Paranasal sinuses are clear. Orbits are unremarkable. Other: None. CT CERVICAL SPINE FINDINGS Motion degraded exam. If there is high clinical concern for a cervical spine fracture, a repeat CT of the cervical spine is recommended. Alignment: Trace retrolisthesis of C6 on C7 Skull base and vertebrae: No acute fracture. No primary bone lesion or focal pathologic process. Soft tissues and spinal canal: No prevertebral fluid or swelling. No visible canal hematoma. Disc levels:  No evidence of high-grade spinal canal stenosis. Upper chest: Negative. Other: None IMPRESSION: 1. No acute intracranial  abnormality. 2. Within the limitations of a motion degraded exam, no evidence of an acute cervical spine fracture. If there is high clinical concern for a cervical  spine fracture, a repeat CT of the cervical spine is recommended. Electronically Signed   By: Lorenza Cambridge M.D.   On: 01/05/2023 14:02    Procedures Procedures    Medications Ordered in ED Medications  potassium chloride SA (KLOR-CON M) CR tablet 40 mEq (has no administration in time range)  metoCLOPramide (REGLAN) injection 10 mg (10 mg Intravenous Given 01/05/23 1423)  diphenhydrAMINE (BENADRYL) injection 12.5 mg (12.5 mg Intravenous Given 01/05/23 1423)  lactated ringers bolus 1,000 mL (1,000 mLs Intravenous New Bag/Given 01/05/23 1423)    ED Course/ Medical Decision Making/ A&P                                 Medical Decision Making Amount and/or Complexity of Data Reviewed Labs: ordered. Radiology: ordered.  Risk Prescription drug management.   This patient presents to the ED for concern of fall, this involves an extensive number of treatment options, and is a complaint that carries with it a high risk of complications and morbidity.  The differential diagnosis includes acute injuries, CVA, TIA, seizure   Co morbidities that complicate the patient evaluation  anemia, arthritis, asthma, bipolar disorder, chronic back pain, HTN, fibromyalgia, anxiety, migraine headaches, depression, T2DM   Additional history obtained:  Additional history obtained from EMS External records from outside source obtained and reviewed including EMR   Lab Tests:  I Ordered, and personally interpreted labs.  The pertinent results include: Baseline anemia, no leukocytosis, mild hypokalemia, mild lactic acidosis   Imaging Studies ordered:  I ordered imaging studies including x-ray of chest and pelvis, CT of head and cervical spine, MRI brain I independently visualized and interpreted imaging which showed no acute findings on x-rays  or CT scans.  MRI pending at time of signout. I agree with the radiologist interpretation   Cardiac Monitoring: / EKG:  The patient was maintained on a cardiac monitor.  I personally viewed and interpreted the cardiac monitored which showed an underlying rhythm of: Sinus rhythm  Problem List / ED Course / Critical interventions / Medication management  Patient presenting after a fall.  She was able to call EMS herself from the floor.  EMS noted difficulty with speech.  She does appear to have some word finding difficulty.  Patient, however, states that this is her normal speech.  On further exam, patient endorses some mild diminished sensation on right hemibody.  I do not appreciate any focal weakness.  Workup was initiated.  Lab work is notable for mild hypokalemia and lactic acidosis.  IV fluids and replacement potassium ordered.  On reassessment, patient resting comfortably.  She did endorse headache and Reglan and Benadryl were ordered.  No acute findings were identified on CT imaging.  MRI of brain was ordered.  This study was pending at time of signout.  Care of patient was signed out to oncoming ED provider. I ordered medication including IV fluids for hydration; potassium chloride for hypokalemia; Reglan and Benadryl for headache. Reevaluation of the patient after these medicines showed that the patient improved I have reviewed the patients home medicines and have made adjustments as needed   Social Determinants of Health:  Lives at home with son         Final Clinical Impression(s) / ED Diagnoses Final diagnoses:  Fall, initial encounter    Rx / DC Orders ED Discharge Orders     None         ,  Alycia Rossetti, MD 01/05/23 [phone]

## 2023-01-05 NOTE — Progress Notes (Signed)
Orthopedic Tech Progress Note Patient Details:  LAQUINTA LAURENCE 12-07-1965 [id number] Level 2 Trauma  Patient ID: [name], female   DOB: [birth year], 57 y.o.   MRN: [id number]  Smitty Pluck 01/05/2023, 1:00 PM

## 2023-01-05 NOTE — ED Provider Notes (Signed)
9:16 PM Assumed care of patient from off-going team. For more details, please see note from same day.  In brief, this is a 57 y.o. female presented w/ fall at home, also word-finding difficulty, speech difficulties. Hasn't taken eliquis x 2 weeks. Trauma w/u negative. Subjective decreased sensation in R hemi-body so getting MRI. Walks w/ cane at baseline.   Plan/Dispo at time of sign-out & ED Course since sign-out: [ ]  MRI  BP 134/82 (BP Location: Right Arm)   Pulse 96   Temp 98.4 F (36.9 C) (Oral)   Resp 20   Ht 5\' 6"  (1.676 m)   Wt 77.1 kg   SpO2 100%   BMI 27.44 kg/m    ED Course:   Clinical Course as of 01/05/23 2116  Wed Jan 05, 2023  1809 MR Brain W and Wo Contrast 1.  No evidence of an acute intracranial abnormality. 2. There are several small foci of T2 FLAIR hyperintense signal abnormality within the cerebral white matter, similar to the prior brain MRI of 05/26/2022. These signal changes are nonspecific and differential considerations include chronic small vessel ischemic disease, sequelae of chronic migraine headaches and sequelae of a prior infectious/inflammatory process, among others.   [HN]  1858 Urinalysis, Routine w reflex microscopic -Urine, Clean Catch(!) Neg UA for UTI [HN]  1943 Patient reevaluated.  Her workup is reassuring and her MRI is negative for any acute stroke.  She states she feels okay but requests her home pain medicine nucynta. Patient states she lives with her son but has been falling more recently and would like a walker. She has unremarkable workup - lactate was mildly elevated but received fluids - and MRI brain is without acute abnormality. She states her speech is baseline. Will consult to [facility] for walker and give home pain meds.  Patient will be discharged home with resources for in-home care should they wanted to, case management, an order for DME rolling walker per social work, and discharge instructions and return precautions.  Instructed  to follow-up with PCP within 1 to 2 weeks. [HN]    Clinical Course User Index [HN] Loetta Rough, MD    Dispo: DC ------------------------------- Vivi Barrack, MD Emergency Medicine  This note was created using dictation software, which may contain spelling or grammatical errors.   Loetta Rough, MD 01/05/23 2117

## 2023-01-05 NOTE — ED Notes (Signed)
Patient transported to MRI 

## 2023-01-05 NOTE — Progress Notes (Signed)
This chaplain joined the medical team responding to Level 2 trauma.   The chaplain introduced herself to the Pt. The chaplain assisted the Pt. in using her cell phone to update a friend of her presence in the ED. After requesting an extra blanket and pillow for the Pt., the Pt. Shared no other needs at this time.  Chaplain Stephanie Acre [phone]

## 2023-01-05 NOTE — Discharge Instructions (Addendum)
Thank you for coming to [facility] Emergency Department. You were seen for recurrent falls, generalized weakness. We did an exam, labs, and imaging, and these showed no acute findings. We have provided an order for a rolling walker per your request for recurrent falls. I also have attached resources for in home care and case management to help with care in the home.   Please follow up with your primary care provider within 1-2 weeks.   Do not hesitate to return to the ED or call 911 if you experience: -Worsening symptoms -Lightheadedness, passing out -Fevers/chills -Anything else that concerns you

## 2023-01-21 ENCOUNTER — Telehealth: Payer: Self-pay | Admitting: *Deleted

## 2023-01-21 NOTE — Telephone Encounter (Signed)
Pt called regarding not having received rollator that was ordered for her at discharge.  RNCM arranged delivery of rollator through [employer].  Mitch of [employer] accepted referral and will have rollator delivered to pt home.

## 2023-01-21 NOTE — Telephone Encounter (Signed)
Mitch, with [employer] notified me that pt will have to private pay for rollator as she received a rolling walker in January and is not covered for another DME through insurance for 5 years.  Marthann Schiller will offer private pay to pt.

## 2023-01-30 NOTE — Progress Notes (Unsigned)
01/31/23: Reports that Botox works well for her migraines. Did NOT take xanax today.   11/04/22: Botox continues to work well. Still has to take xanax before her injections. Significant other is with her today.   07/26/22: Reports that she presently gets 2 migraines a week.  Botox has been extremely helpful for her.  She does have to take Xanax before her injections.  04/19/2022: On botox only gets 4 migraines a month. IS VERY ANXIOUS TAKES XANAX before coming in be very gentle with injections   01/19/2022: Super anxious, but doing very well with botox, > 50% improvement in migraine freq and severity. Gave xanax to use prior to injections, discussed risks of taking xanax with nucynta including resp failure and death, discussed take one tablet 30-60 minutes before botox procedure, do not take at any other time, she undertood and agreed. will discuss disposing of any unused medication at next appointment     10/27/2021: stable doing well. She lost 30 pounds, her hair looks great, she had an episode of dizziness and weakness wasn;t eating or drinking after a breakup, she has had some falls due to right leg weakness pending mri lumbar spine we wll see her on the 22nd.     07/21/2021: She gets > 70% relief migraine frequency, extremely anxious, do the botox fast, +a   04/21/2021: stable, still doing same if not better > 60% improvement. She broke up with her boyfriend. Patient states she did not take the xanax, she is here alone, I warned her not to drive if she takes xanax or clonazepam and have someone drive her.    06/23/8655: getting back on track. Will give some xanax for next time she is incredibly stressed when she comes in, she is lovely, here with fiance   Interval history 04/28/2020: Doing great, More than 60% improvement in frequency but also in severity. Ajovy not approve using emgality instead. she has failed imitrex and maxalt and cannot take anymore triptans, contraindicated due to side  effects, gave her some samples. She feels clenching is a problem and she has pain when clenching in the temples as well and we give her muscle relaxers since we cannot put botox there. She had HTN on aimovig, stopped. She had botox in the past and we restarted on 03/2019, doing great. Loves emgality as an add on as well. No date set for her wedding yet, it has been 2.5 years. She is very anxious and took anxiety medication today and did better but she is very anxious. She wants an outdoor wedding. She did much better, she took her anxiety and pain meds. [facility]    BOTOX PROCEDURE NOTE FOR MIGRAINE HEADACHE    Contraindications and precautions discussed with patient(above). Aseptic procedure was observed and patient tolerated procedure. Procedure performed by Butch Penny, NP  The condition has existed for more than 6 months, and pt does not have a diagnosis of ALS, Myasthenia Gravis or Lambert-Eaton Syndrome.  Risks and benefits of injections discussed and pt agrees to proceed with the procedure.  Written consent obtained  These injections are medically necessary. These injections do not cause sedations or hallucinations which the oral therapies may cause.  Indication/Diagnosis: chronic migraine BOTOX(J0585) injection was performed according to protocol by [employer]. 200 units of BOTOX was dissolved into 4 cc NS.   NDC: [phone]  Type of toxin: Botox  Botox consent signed Botox- 200 units x 1 vial Lot:  [id number] Expiration: 04/2025 NDC: 9562-1308-65   Bacteriostatic 0.9% Sodium Chloride- 4 mL  Lot: [id number] Expiration: 08/23/2023 NDC: 2952-8413-24   Dx: M01.027       Description of procedure:  The patient was placed in a sitting position. The standard protocol was used for Botox as follows, with 5 units of Botox injected at each site:   -Procerus muscle, midline injection  -Corrugator muscle, bilateral  injection  -Frontalis muscle, bilateral injection, with 2 sites each side, medial injection was performed in the upper one third of the frontalis muscle, in the region vertical from the medial inferior edge of the superior orbital rim. The lateral injection was again in the upper one third of the forehead vertically above the lateral limbus of the cornea, 1.5 cm lateral to the medial injection site.  -Temporalis muscle injection, 4 sites, bilaterally. The first injection was 3 cm above the tragus of the ear, second injection site was 1.5 cm to 3 cm up from the first injection site in line with the tragus of the ear. The third injection site was 1.5-3 cm forward between the first 2 injection sites. The fourth injection site was 1.5 cm posterior to the second injection site.  -Occipitalis muscle injection, 3 sites, bilaterally. The first injection was done one half way between the occipital protuberance and the tip of the mastoid process behind the ear. The second injection site was done lateral and superior to the first, 1 fingerbreadth from the first injection. The third injection site was 1 fingerbreadth superiorly and medially from the first injection site.  -Cervical paraspinal muscle injection, 2 sites, bilateral knee first injection site was 1 cm from the midline of the cervical spine, 3 cm inferior to the lower border of the occipital protuberance. The second injection site was 1.5 cm superiorly and laterally to the first injection site.  -Trapezius muscle injection was performed at 3 sites, bilaterally. The first injection site was in the upper trapezius muscle halfway between the inflection point of the neck, and the acromion. The second injection site was one half way between the acromion and the first injection site. The third injection was done between the first injection site and the inflection point of the neck.   Will return for repeat injection in 3 months.   A 200 units of Botox was  used, 155 units were injected, the rest of the Botox was wasted. The patient tolerated the procedure well, there were no complications of the above procedure.  Butch Penny, MSN, NP-C 01/30/2023, 4:38 PM [facility] [address], [address] [place], [place] [address] [phone]

## 2023-01-31 ENCOUNTER — Ambulatory Visit (INDEPENDENT_AMBULATORY_CARE_PROVIDER_SITE_OTHER): Payer: 59 | Admitting: Adult Health

## 2023-01-31 DIAGNOSIS — G43711 Chronic migraine without aura, intractable, with status migrainosus: Secondary | ICD-10-CM | POA: Diagnosis not present

## 2023-01-31 MED ORDER — ONABOTULINUMTOXINA 200 UNITS IJ SOLR
155.0000 [IU] | Freq: Once | INTRAMUSCULAR | Status: AC
Start: 2023-01-31 — End: 2023-01-31
  Administered 2023-01-31: 155 [IU] via INTRAMUSCULAR

## 2023-01-31 NOTE — Progress Notes (Signed)
Botox consent signed Botox- 200 units x 1 vial Lot: [id number] Expiration: 04/2025 NDC: 5409-8119-14  Bacteriostatic 0.9% Sodium Chloride- 4 mL  Lot: [id number] Expiration: 08/23/2023 NDC: 2130-8657-84  Dx: O96.295 S/P  Witnessed by S. [facility] CMA

## 2023-02-13 ENCOUNTER — Other Ambulatory Visit: Payer: Self-pay

## 2023-02-13 ENCOUNTER — Encounter (HOSPITAL_COMMUNITY): Payer: Self-pay

## 2023-02-13 ENCOUNTER — Emergency Department (HOSPITAL_COMMUNITY)
Admission: EM | Admit: 2023-02-13 | Discharge: 2023-02-13 | Disposition: A | Discharge status: Home / Self Care | Payer: 59 | Attending: Emergency Medicine | Admitting: Emergency Medicine

## 2023-02-13 DIAGNOSIS — L03116 Cellulitis of left lower limb: Secondary | ICD-10-CM | POA: Insufficient documentation

## 2023-02-13 DIAGNOSIS — Z91040 Latex allergy status: Secondary | ICD-10-CM | POA: Insufficient documentation

## 2023-02-13 DIAGNOSIS — E119 Type 2 diabetes mellitus without complications: Secondary | ICD-10-CM | POA: Insufficient documentation

## 2023-02-13 DIAGNOSIS — M25572 Pain in left ankle and joints of left foot: Secondary | ICD-10-CM | POA: Diagnosis present

## 2023-02-13 DIAGNOSIS — Z7901 Long term (current) use of anticoagulants: Secondary | ICD-10-CM | POA: Insufficient documentation

## 2023-02-13 MED ORDER — DOXYCYCLINE HYCLATE 100 MG PO CAPS: 100.0000 mg | ORAL_CAPSULE | Freq: Two times a day (BID) | ORAL | 0 refills | Status: AC

## 2023-02-13 NOTE — ED Provider Notes (Signed)
[place] EMERGENCY DEPARTMENT AT [facility] Provider Note   CSN: [id number] Arrival date & time: 02/13/23  1457     History  No chief complaint on file.   [name] is a 57 y.o. female.  HPI 57 year old female with history of prior PE on Eliquis, GERD, diabetes presenting for spider bite.  Patient states 2 days ago she saw a spider and it bit her on her left ankle.  She developed significant swelling and erythema and pain.  She went to a wedding over the weekend so did not present until today.  Yesterday it was more swollen and more red and has improved today.  She has had some pain and itching.  She been taking Benadryl and using topical cortisone.  She reported a fever couple days ago but none since.  No nausea or vomiting.  She has history of blood clots, she had mild swelling of the left leg but has been adherent to her Eliquis.  She feels like symptoms are improving but she went to get checked out on that she is back in town.     Home Medications Prior to Admission medications   Medication Sig Start Date End Date Taking? Authorizing Provider  doxycycline (VIBRAMYCIN) 100 MG capsule Take 1 capsule (100 mg total) by mouth 2 (two) times daily for 7 days. 02/13/23 02/20/23 Yes Laurence Spates, MD  acetaminophen (TYLENOL) 650 MG CR tablet Take 1,300 mg by mouth as needed for pain.    [provider]  albuterol (VENTOLIN HFA) 108 (90 Base) MCG/ACT inhaler Inhale 2 puffs into the lungs every 6 (six) hours as needed for wheezing. 12/20/19   Whitmire, Dawn, FNP  apixaban (ELIQUIS) 5 MG TABS tablet Take 2 tablets (10 mg total) by mouth 2 (two) times daily for 4 days, THEN 1 tablet (5 mg total) 2 (two) times daily for 21 days. Patient taking differently: Take 5 mg by mouth once daily  06/04/22 11/11/22  Erick Alley, DO  Ascorbic Acid (VITAMIN C PO) Take 1 tablet by mouth daily. Patient not taking: Reported on 11/11/2022    [provider]  azelastine  (ASTELIN) 0.1 % nasal spray Place 2 sprays into both nostrils daily as needed for allergies. Use in each nostril as directed    [provider]  BIOTIN PO Take 1 capsule by mouth daily.    [provider]  botulinum toxin Type A (BOTOX) 200 units injection Provider to inject 155 units into the muscles of the head and neck every 12 weeks. Discard remainder. 10/05/22   Butch Penny, NP  calcium carbonate (TUMS - DOSED IN MG ELEMENTAL CALCIUM) 500 MG chewable tablet Chew 1,000 mg by mouth as needed for indigestion or heartburn.    [provider]  cetirizine (ZYRTEC) 10 MG tablet Take 10 mg by mouth daily. 10/20/21   [provider]  Cyanocobalamin (VITAMIN B-12 PO) Take 1 capsule by mouth daily.    [provider]  diclofenac Sodium (VOLTAREN) 1 % GEL Apply 4 g topically 4 (four) times daily. Patient taking differently: Apply 1 Application topically as needed (pain). 06/04/22   Erick Alley, DO  divalproex (DEPAKOTE ER) 500 MG 24 hr tablet Take 2 tablets (1,000 mg total) by mouth at bedtime AND 1 tablet (500 mg total) daily. 11/11/22   Oletta Darter, MD  EPINEPHrine (EPIPEN 2-PAK) 0.3 mg/0.3 mL IJ SOAJ injection Inject 0.3 mg into the muscle as needed for anaphylaxis.    [provider]  fluticasone (FLONASE) 50 MCG/ACT nasal spray Place 1 spray into both nostrils as needed (allergies/runny nose). 06/04/22   Erick Alley, DO  Galcanezumab-gnlm (EMGALITY) 120 MG/ML SOAJ INJECT 120 MG INTO THE SKIN EVERY 30 DAYS Patient taking differently: Inject 120 mg into the skin every 30 (thirty) days. 07/26/22   Anson Fret, MD  lidocaine (LIDODERM) 5 % Place 1 patch onto the skin daily as needed (pain).    [provider]  Melatonin 10 MG CHEW Chew 30 mg by mouth at bedtime as needed (sleep).    [provider]  montelukast (SINGULAIR) 10 MG tablet Take 10 mg by mouth at bedtime as needed (allergies). 08/07/19   [provider]   Multiple Vitamin (MULTIVITAMIN) tablet Take 1 tablet by mouth in the morning.    [provider]  naloxone [facility]) nasal spray 4 mg/0.1 mL Place 0.4 mg into the nose once. 06/19/18   [provider]  NUCYNTA 75 MG tablet Take 75 mg by mouth 2 (two) times daily as needed for moderate pain or severe pain.    [provider]  oxyCODONE (OXY IR/ROXICODONE) 5 MG immediate release tablet Take 1 tablet (5 mg total) by mouth every 6 (six) hours as needed for breakthrough pain. 10/06/22   Maczis, Elmer Sow, PA-C  Rimegepant Sulfate (NURTEC) 75 MG TBDP Take 1 tablet (75 mg total) by mouth daily. Patient taking differently: Take 75 mg by mouth as needed (migraines). 08/05/22   Anson Fret, MD  tirzepatide [facility]) 7.5 MG/0.5ML Pen Inject 7.5 mg into the skin once a week.    [provider]      Allergies    Bee venom, Bioflavonoid products, Coconut (cocos nucifera), Coconut fatty acid, Coconut oil, Food, Mixed ragweed, Molds & smuts, Mushroom ext cmplx(shiitake-reishi-mait), Shellfish allergy, Strawberry extract, Oxycodone, Latex, and Norco [hydrocodone-acetaminophen]    Review of Systems   Review of Systems Review of systems completed and notable as per HPI.  ROS otherwise negative.   Physical Exam Updated Vital Signs BP 129/83 (BP Location: Left Arm)   Pulse 94   Temp 98.6 F (37 C) (Oral)   Resp 16   Ht 5\' 6"  (1.676 m)   Wt 77.1 kg   SpO2 97%   BMI 27.43 kg/m  Physical Exam Vitals and nursing note reviewed.  Constitutional:      General: She is not in acute distress.    Appearance: She is well-developed.  HENT:     Head: Normocephalic and atraumatic.  Eyes:     Conjunctiva/sclera: Conjunctivae normal.  Cardiovascular:     Rate and Rhythm: Normal rate and regular rhythm.     Heart sounds: No murmur heard. Pulmonary:     Effort: Pulmonary effort is normal. No respiratory distress.     Breath sounds: Normal breath sounds.  Abdominal:      Palpations: Abdomen is soft.     Tenderness: There is no abdominal tenderness.  Musculoskeletal:        General: No swelling.     Cervical back: Neck supple.     Left lower leg: Edema present.     Comments: Mild swelling erythema to left anterior ankle.  She has small puncture wound consistent with insect bite.  She has full range of motion at the ankle and foot without significant pain or tenderness.  No circumferential swelling or redness.  No fluctuance, crepitus.  She is palpable 2+ DP and PT pulse.  Normal sensation.  Skin:  General: Skin is warm and dry.     Capillary Refill: Capillary refill takes less than 2 seconds.  Neurological:     Mental Status: She is alert.  Psychiatric:        Mood and Affect: Mood normal.     ED Results / Procedures / Treatments   Labs (all labs ordered are listed, but only abnormal results are displayed) Labs Reviewed - No data to display  EKG None  Radiology No results found.  Procedures Procedures    Medications Ordered in ED Medications - No data to display  ED Course/ Medical Decision Making/ A&P                                 Medical Decision Making  Medical Decision Making:   [name] is a 57 y.o. female who presented to the ED today with left leg pain after spider bite.  Vital signs reviewed, she is afebrile with unremarkable vital signs.  No signs of sepsis.  She is very well-appearing.  I saw picture from her phone from yesterday that showed significant erythema and swelling to the left ankle around the bite site.  It is improved markedly from yesterday on its own.  She does have some mild erythema and warmth.  Could just be inflammation from the spider bite but concern for possible early cellulitis.  Will treat with course of doxycycline.  I have low suspicion for abscess, necrotizing faction, septic arthritis.  She is history of prior PE and is on Eliquis have low special for DVT swelling is localized to the area of  erythema.  Recommend she follow-up closely with her PCP later this week for reevaluation given strict return precautions.  Discharged in stable condition.  She is up-to-date on tetanus.   Patient's presentation is most consistent with acute, uncomplicated illness.           Final Clinical Impression(s) / ED Diagnoses Final diagnoses:  Cellulitis of left leg    Rx / DC Orders ED Discharge Orders          Ordered    doxycycline (VIBRAMYCIN) 100 MG capsule  2 times daily        02/13/23 1554              Laurence Spates, MD 02/13/23 1554

## 2023-02-13 NOTE — ED Triage Notes (Signed)
Pt states a medium size black spider bit her left ankle 2 days ago. Pt has 1+ swelling of left ankle. Pt's left ankle is erythematous. Pt has 2+ left pedal pulse, warm to touch.

## 2023-02-13 NOTE — Discharge Instructions (Addendum)
Your leg has a bacterial infection called cellulitis.  You are being started on antibiotic.  You should eat and drink with this and not lay down shortly after taking as it may cause some stomach discomfort.  You should follow-up with your primary care doctor within the next week.  If you develop severe pain, fevers or severe swelling you should return to the ED.

## 2023-02-13 NOTE — ED Notes (Signed)
This RN reviewed discharge instructions with patient. She verbalized understanding and denied any further questions. PT well appearing upon discharge and reports tolerable pain. Pt ambulated with stable gait to exit. Pt endorses ride home.

## 2023-03-24 ENCOUNTER — Ambulatory Visit: Payer: 59

## 2023-03-24 ENCOUNTER — Ambulatory Visit
Admission: EM | Admit: 2023-03-24 | Discharge: 2023-03-24 | Disposition: A | Discharge status: Home / Self Care | Payer: 59 | Attending: Internal Medicine | Admitting: Internal Medicine

## 2023-03-24 DIAGNOSIS — S99922A Unspecified injury of left foot, initial encounter: Secondary | ICD-10-CM | POA: Diagnosis not present

## 2023-03-24 DIAGNOSIS — M79672 Pain in left foot: Secondary | ICD-10-CM

## 2023-03-24 NOTE — ED Provider Notes (Signed)
[name]    CSN: [id number] Arrival date & time: 03/24/23  1632      History   Chief Complaint Chief Complaint  Patient presents with   Toe Injury    HPI [name] is a 57 y.o. female.   Patient here today for evaluation of left toe pain to her third and fourth toes that started after a stepladder fell on them 2 days ago.  She reports that she has been using ice and taking Nucynta without resolution.  She has not had true numbness.  She is on a blood thinner and has had significant bruising to her third toe.  Movement and walking worsen pain.  The history is provided by the patient.    Past Medical History:  Diagnosis Date   Acute kidney injury (HCC) 10/25/2020   Acute respiratory disease due to COVID-19 virus 10/25/2020   AKI (acute kidney injury) (HCC) 10/25/2020   Allergic rhinitis    Allergic to cats    pet dander   Allergy    Anemia    Anxiety    Arthritis 09/2018   both feet   Asthma    Back pain    Bipolar disorder (HCC)    Cervicalgia    Chronic fatigue syndrome    Chronic low back pain with left-sided sciatica    Class 3 severe obesity with serious comorbidity and body mass index (BMI) of 40.0 to 44.9 in adult (HCC) 11/14/2014   Constipation    Depression    Diabetes mellitus without complication (HCC)    "pre"   Dyspnea    Environmental allergies    Fibromyalgia    GERD (gastroesophageal reflux disease)    Grave's disease    Heart murmur    High cholesterol    History of blood clots    Hypertension    Joint pain    Lactose intolerance    Lower extremity edema    Multiple food allergies    OSA (obstructive sleep apnea)    Osteoporosis    Palpitations    Panic disorder    PE (pulmonary thromboembolism) (HCC)    on Eliquis   Pharyngoesophageal dysphagia 06/24/2014   Prediabetes 05/11/2019   Protein-calorie malnutrition, mild (HCC) 06/23/2021   Pulmonary embolism (HCC) 2024   Rheumatoid arthritis (HCC)    Risk for  falls    Sciatica    Severe 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 (HCC)    Sleep apnea    no cpap worn   Subclinical hypothyroidism 05/11/2019   Syncope    Syncope and collapse    Thyroid disease    Vasovagal syncope    Vertigo    Vitamin D deficiency     Patient Active Problem List   Diagnosis Date Noted   Cholecystitis, acute with cholelithiasis 10/03/2022   Choledocholithiasis 10/02/2022   Acute left ankle pain 06/02/2022   Pulmonary embolism (HCC) 06/01/2022   Acute pain of left foot 05/27/2022   Type 2 diabetes mellitus without complications (HCC) 05/26/2022   Anemia 05/26/2022   Urinary retention 05/26/2022   Syncope and collapse 05/25/2022   Lumbosacral radiculopathy at L5 01/28/2022   Lumbosacral radiculopathy at S1 01/28/2022   Multiple neurological symptoms 06/23/2021   Metabolic syndrome 10/21/2020   Constipation 07/09/2020   OSA (obstructive sleep apnea) 05/11/2019   Intractable chronic migraine without aura and with status migrainosus 01/14/2016   Risk for falls 09/02/2015   Insomnia 05/27/2015   Bipolar disorder (HCC) 02/25/2015  Severe 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 (HCC) 10/10/2014   GAD (generalized anxiety disorder) 10/10/2014   Panic disorder with agoraphobia 10/10/2014   Social anxiety disorder 10/10/2014   Asthma 08/01/2014   Low back pain 07/24/2014   Encounter for monitoring opioid maintenance therapy 07/02/2014   S/P gastric bypass 06/24/2014   Hx of recurrent syncope 05/24/2013   Drug overdose, multiple drugs 11/01/2012   Migraine 05/22/2012   Depression 11/22/2011   Chronic pain 11/22/2011   Graves' disease    GERD (gastroesophageal reflux disease)    Need for prophylactic postmenopausal hormone replacement therapy 11/03/2011   Allergic rhinitis 09/28/2011   Essential hypertension 09/28/2011    Past Surgical History:  Procedure Laterality Date   ABDOMINAL HYSTERECTOMY  2006    CHOLECYSTECTOMY N/A 10/04/2022   Procedure: LAPAROSCOPIC CHOLECYSTECTOMY WITH INTRAOPERATIVE CHOLANGIOGRAM;  Surgeon: Manus Rudd, MD;  Location: [facility] OR;  Service: General;  Laterality: N/A;   COLONOSCOPY     GASTRIC BYPASS  2008    OB History     Gravida  2   Para  2   Term      Preterm      AB      Living  2      SAB      IAB      Ectopic      Multiple      Live Births               Home Medications    Prior to Admission medications   Medication Sig Start Date End Date Taking? Authorizing Provider  apixaban (ELIQUIS) 5 MG TABS tablet Take 5 mg by mouth 2 (two) times daily. 06/04/22  Yes [provider]  azelastine (ASTELIN) 0.1 % nasal spray Place 1 spray into both nostrils 2 (two) times daily. 04/16/22  Yes [provider]  CREON 36000-114000 units CPEP capsule Take 36,000 Units by mouth as directed. 01/20/23  Yes [provider]  doxepin (SINEQUAN) 150 MG capsule Take 150 mg by mouth at bedtime. 06/25/22  Yes [provider]  hydrOXYzine (ATARAX) 10 MG tablet Take 10 mg by mouth every 8 (eight) hours as needed for itching or anxiety. 03/01/23  Yes [provider]  ibuprofen (ADVIL) 800 MG tablet Take 800 mg by mouth every 8 (eight) hours as needed for fever, headache, mild pain (pain score 1-3), moderate pain (pain score 4-6) or cramping. 01/20/15  Yes [provider]  lidocaine (LIDODERM) 5 % Place 1 patch onto the skin daily. 01/20/15  Yes [provider]  meloxicam (MOBIC) 15 MG tablet Take 15 mg by mouth daily. 02/03/23  Yes [provider]  orphenadrine (NORFLEX) 100 MG tablet Take 100 mg by mouth 2 (two) times daily as needed for muscle spasms or mild pain (pain score 1-3). 01/20/15  Yes [provider]  prazosin (MINIPRESS) 1 MG capsule Take 1 mg by mouth at bedtime. 02/11/23  Yes [provider]  predniSONE (STERAPRED UNI-PAK 21 TAB) 10 MG (21) TBPK tablet Take 10 mg by  mouth as directed. 12/15/22  Yes [provider]  tapentadol HCl (NUCYNTA) 75 MG tablet Take 75 mg by mouth 3 (three) times daily. 10/13/22  Yes [provider]  acetaminophen (TYLENOL) 650 MG CR tablet Take 1,300 mg by mouth as needed for pain.    [provider]  acetaminophen (TYLENOL) 650 MG CR tablet Take 1 tablet by mouth 3 (three) times daily.    [provider]  albuterol (PROVENTIL HFA) 108 (90 Base) MCG/ACT inhaler Inhale 2 puffs into the lungs every 4 (four) hours as needed for wheezing or shortness of breath.    [provider]  apixaban (ELIQUIS) 5 MG TABS tablet Take 2 tablets (10 mg total) by mouth 2 (two) times daily for 4 days, THEN 1 tablet (5 mg total) 2 (two) times daily for 21 days. Patient taking differently: Take 5 mg by mouth once daily  06/04/22 11/11/22  Erick Alley, DO  apixaban (ELIQUIS) 5 MG TABS tablet Take 5 mg by mouth 2 (two) times daily.    [provider]  Artificial Saliva (MUCOSITISRX) PACK Take 15 mLs by mouth as directed.  Swish and spit 15mL by mouth four times daily.    [provider]  azelastine (ASTELIN) 0.1 % nasal spray Place 2 sprays into both nostrils daily as needed for allergies. Use in each nostril as directed    [provider]  benzonatate (TESSALON) 100 MG capsule Take 100 mg by mouth 2 (two) times daily as needed for cough.    [provider]  BIOTIN PO Take 1 capsule by mouth daily.    [provider]  botulinum toxin Type A (BOTOX) 200 units injection Provider to inject 155 units into the muscles of the head and neck every 12 weeks. Discard remainder. 10/05/22   Butch Penny, NP  budesonide-formoterol (SYMBICORT) 80-4.5 MCG/ACT inhaler Inhale 2 puffs into the lungs as directed.    [provider]  buPROPion (WELLBUTRIN XL) 150 MG 24 hr tablet Take 3 tablets by mouth daily.    [provider]  calcium carbonate (TUMS - DOSED IN MG ELEMENTAL  CALCIUM) 500 MG chewable tablet Chew 1,000 mg by mouth as needed for indigestion or heartburn.    [provider]  cetirizine (ZYRTEC) 10 MG tablet Take 10 mg by mouth daily. 10/20/21   [provider]  Cholecalciferol (VITAMIN D-1000 MAX ST) 25 MCG (1000 UT) tablet Take 1 tablet by mouth 2 (two) times daily.    [provider]  citalopram (CELEXA) 20 MG tablet Take 20 mg by mouth daily.    [provider]  Cyanocobalamin (VITAMIN B-12 PO) Take 1 capsule by mouth daily.    [provider]  cyclobenzaprine (FLEXERIL) 10 MG tablet Take 10 mg by mouth 3 (three) times daily as needed for muscle spasms.    [provider]  diclofenac Sodium (VOLTAREN) 1 % GEL Apply 4 g topically 4 (four) times daily. Patient taking differently: Apply 1 Application topically as needed (pain). 06/04/22   Erick Alley, DO  diphenhydrAMINE (SOMINEX) 25 MG tablet Take 25 mg by mouth as needed for itching, allergies or sleep.    [provider]  divalproex (DEPAKOTE ER) 500 MG 24 hr tablet Take 2 tablets (1,000 mg total) by mouth at bedtime AND 1 tablet (500 mg total) daily. 11/11/22   Oletta Darter, MD  EPINEPHrine (EPIPEN 2-PAK) 0.3 mg/0.3 mL IJ SOAJ injection Inject 0.3 mg into the muscle as needed for anaphylaxis.    [provider]  Ergocalciferol (VITAMIN D2 PO) Take by mouth.    [provider]  estradiol (ESTRACE) 0.5 MG tablet Take 0.5 mg by mouth daily.    [provider]  fluticasone (FLONASE) 50 MCG/ACT nasal spray Place 1 spray into both nostrils as needed (allergies/runny nose). 06/04/22   Erick Alley, DO  gabapentin (NEURONTIN) 600 MG tablet Take 600 mg by mouth 3 (three) times daily.  [provider]  Galcanezumab-gnlm (EMGALITY) 120 MG/ML SOAJ INJECT 120 MG INTO THE SKIN EVERY 30 DAYS Patient taking differently: Inject 120 mg into the skin every 30 (thirty) days. 07/26/22   Anson Fret, MD   hydrochlorothiazide (HYDRODIURIL) 12.5 MG tablet Take 12.5 mg by mouth daily.    [provider]  hydrOXYzine (ATARAX) 25 MG tablet Take 25 mg by mouth 3 (three) times daily.    [provider]  Lactobacillus (ACIDOPHILUS EXTRA STRENGTH) CAPS Take 1 capsule by mouth daily.    [provider]  levofloxacin (LEVAQUIN) 500 MG tablet Take 500 mg by mouth daily.    [provider]  lidocaine (LIDODERM) 5 % Place 1 patch onto the skin daily as needed (pain).    [provider]  meclizine (ANTIVERT) 25 MG tablet Take 25 mg by mouth 3 (three) times daily.    [provider]  Melatonin 10 MG CHEW Chew 30 mg by mouth at bedtime as needed (sleep).    [provider]  meloxicam (MOBIC) 7.5 MG tablet Take 7.5 mg by mouth daily.    [provider]  montelukast (SINGULAIR) 10 MG tablet Take 10 mg by mouth at bedtime as needed (allergies). 08/07/19   [provider]  montelukast (SINGULAIR) 10 MG tablet Take 1 tablet by mouth daily.    [provider]  Multiple Vitamin (MULTIVITAMIN) tablet Take 1 tablet by mouth in the morning.    [provider]  naloxegol oxalate (MOVANTIK) 25 MG TABS tablet Take 1 tablet by mouth every morning.    [provider]  naloxone [facility] nasal spray 4 mg/0.1 mL Place 0.4 mg into the nose once. 06/19/18   [provider]  NUCYNTA 75 MG tablet Take 75 mg by mouth 2 (two) times daily as needed for moderate pain or severe pain.    [provider]  olopatadine (PATANOL) 0.1 % ophthalmic solution Place 1 drop into both eyes daily at 6 (six) AM.    [provider]  OLOPATADINE HCL OP Apply to eye.    [provider]  ondansetron (ZOFRAN) 4 MG tablet Take 1 tablet by mouth every 8 (eight) hours as needed.    [provider]  ondansetron (ZOFRAN-ODT) 4 MG disintegrating tablet Take 4 mg by mouth every 8 (eight) hours as needed for nausea or  vomiting.    [provider]  oxyCODONE (OXY IR/ROXICODONE) 5 MG immediate release tablet Take 1 tablet (5 mg total) by mouth every 6 (six) hours as needed for breakthrough pain. 10/06/22   Maczis, Elmer Sow, PA-C  oxyCODONE-acetaminophen (PERCOCET/ROXICET) 5-325 MG tablet Take 1 tablet by mouth every 4 (four) hours as needed for severe pain (pain score 7-10).    [provider]  pantoprazole (PROTONIX) 40 MG tablet Take 40 mg by mouth daily.    [provider]  QUEtiapine (SEROQUEL) 50 MG tablet Take 50 mg by mouth 2 (two) times daily.    [provider]  raNITIdine HCl (RANITIDINE 150 MAX STRENGTH PO) Take 1 tablet by mouth daily.    [provider]  Rimegepant Sulfate (NURTEC) 75 MG TBDP Take 1 tablet (75 mg total) by mouth daily. Patient taking differently: Take 75 mg by mouth as needed (migraines). 08/05/22   Anson Fret, MD  tirzepatide [facility]) 7.5 MG/0.5ML Pen Inject 7.5 mg into the skin once a week.    [provider]  topiramate (TOPAMAX) 50 MG tablet Take 50 mg by mouth  2 (two) times daily.    [provider]  triamcinolone cream (KENALOG) 0.1 % Apply 1 Application topically 2 (two) times daily.    [provider]  trolamine salicylate (ASPERCREME) 10 % cream Apply 1 Application topically as needed for muscle pain.    [provider]  Trolamine Salicylate 10 % AERO Apply topically.    [provider]    Family History Family History  Problem Relation Age of Onset   Hypertension Mother    Cirrhosis Mother    Alcohol abuse Mother    Depression Mother    Physical abuse Mother    Hyperlipidemia Mother    Thyroid disease Mother    Anxiety disorder Mother    Arthritis Mother    Hypertension Father    Alcohol abuse Father    Hyperlipidemia Father    Depression Father    Drug abuse Father    Arthritis Father    COPD Father    Gout Father    Hypertension Sister    Alcohol abuse Sister     Drug abuse Sister    Depression Sister    Anxiety disorder Sister    OCD Sister    Thyroid disease Sister    Depression Sister    Thyroid disease Sister    Stroke Maternal Grandmother    Heart attack Maternal Grandmother    Diabetes Maternal Uncle    Stroke Maternal Uncle    Colon cancer Maternal Uncle    Seizures Cousin    Breast cancer Cousin    Breast cancer Cousin    ADD / ADHD Other    Diabetes Other    Hypertension Other    Dementia Neg Hx    Esophageal cancer Neg Hx    Rectal cancer Neg Hx    Stomach cancer Neg Hx     Social History Social History   Tobacco Use   Smoking status: Never   Smokeless tobacco: Never  Vaping Use   Vaping status: Never Used  Substance Use Topics   Alcohol use: Not Currently    Comment: 1-2 drinks/month   Drug use: No     Allergies   Bee venom, Bioflavonoid products, Cocamide, Coconut (cocos nucifera), Coconut fatty acid, Coconut oil, Food, Mixed ragweed, Molds & smuts, Mushroom ext cmplx(shiitake-reishi-mait), Other, Shellfish allergy, Strawberry extract, Ambrosia artemisiifolia (ragweed) skin test, Oxycodone, Hydrocodone-acetaminophen, and Latex   Review of Systems Review of Systems  Constitutional:  Negative for chills and fever.  Eyes:  Negative for discharge and redness.  Respiratory:  Negative for shortness of breath.   Gastrointestinal:  Negative for abdominal pain, nausea and vomiting.  Musculoskeletal:  Positive for arthralgias and joint swelling.  Skin:  Positive for color change. Negative for wound.  Neurological:  Negative for numbness.     Physical Exam Triage Vital Signs ED Triage Vitals  Encounter Vitals Group     BP 03/24/23 1639 125/78     Systolic BP Percentile --      Diastolic BP Percentile --      Pulse Rate 03/24/23 1639 92     Resp 03/24/23 1639 16     Temp 03/24/23 1639 98 F (36.7 C)     Temp Source 03/24/23 1639 Oral     SpO2 03/24/23 1639 96 %     Weight --      Height --      Head  Circumference --      Peak Flow --      Pain  Score 03/24/23 1640 8     Pain Loc --      Pain Education --      Exclude from Growth Chart --    No data found.  Updated Vital Signs BP 125/78 (BP Location: Left Arm)   Pulse 92   Temp 98 F (36.7 C) (Oral)   Resp 16   SpO2 96%      Physical Exam Vitals and nursing note reviewed.  Constitutional:      General: She is not in acute distress.    Appearance: Normal appearance. She is not ill-appearing.  HENT:     Head: Normocephalic and atraumatic.  Eyes:     Conjunctiva/sclera: Conjunctivae normal.  Cardiovascular:     Rate and Rhythm: Normal rate.  Pulmonary:     Effort: Pulmonary effort is normal. No respiratory distress.  Musculoskeletal:     Comments: Decreased range of motion of the left smaller toes  Skin:    Capillary Refill: Normal cap refill to left toes    Comments: Bruising noted to third left toe diffusely  Neurological:     Mental Status: She is alert.     Comments: Gross sensation intact to distal left toes  Psychiatric:        Mood and Affect: Mood normal.        Behavior: Behavior normal.        Thought Content: Thought content normal.      UC Treatments / Results  Labs (all labs ordered are listed, but only abnormal results are displayed) Labs Reviewed - No data to display  EKG   Radiology DG Foot Complete Left  Result Date: 03/24/2023 CLINICAL DATA:  Pain, numbness, and tingling in the left foot after injury yesterday. EXAM: LEFT FOOT - COMPLETE 3+ VIEW COMPARISON:  Left ankle 06/01/2022 FINDINGS: Mild diffuse bone demineralization. Old ununited ossicle over the cuboidal bone. No evidence of acute fracture or dislocation. No focal bone lesion or bone destruction. Small Achilles and plantar calcaneal spurs. Soft tissues are unremarkable. IMPRESSION: No acute bony abnormalities. Electronically Signed   By: Burman Nieves M.D.   On: 03/24/2023 18:20    Procedures Procedures (including critical  care time)  Medications Ordered in UC Medications - No data to display  Initial Impression / Assessment and Plan / UC Course  I have reviewed the triage vital signs and the nursing notes.  Pertinent labs & imaging results that were available during my care of the patient were reviewed by me and considered in my medical decision making (see chart for details).    X-ray without fracture.  Recommended ice, elevation if needed.  Encouraged follow-up if no gradual improvement with any further concerns.  Final Clinical Impressions(s) / UC Diagnoses   Final diagnoses:  Foot injury, left, initial encounter   Discharge Instructions   None    ED Prescriptions   None    PDMP not reviewed this encounter.   Tomi Bamberger, PA-C 03/24/23 1843

## 2023-03-24 NOTE — ED Triage Notes (Signed)
Pt states left toe pain to 3rd and 4th toes.  States a step ladder fell on them 2 days ago.  States she has been using ice and taking Nucenta at home for the pain.

## 2023-04-15 ENCOUNTER — Ambulatory Visit
Admission: EM | Admit: 2023-04-15 | Discharge: 2023-04-15 | Disposition: A | Discharge status: Home / Self Care | Payer: 59 | Attending: Physician Assistant | Admitting: Physician Assistant

## 2023-04-15 DIAGNOSIS — H60391 Other infective otitis externa, right ear: Secondary | ICD-10-CM | POA: Diagnosis not present

## 2023-04-15 MED ORDER — CIPROFLOXACIN-DEXAMETHASONE 0.3-0.1 % OT SUSP: 4.0000 [drp] | Freq: Two times a day (BID) | OTIC | 0 refills | Status: AC

## 2023-04-15 NOTE — ED Triage Notes (Signed)
Right ear pain with drainage 4 days ago.  Has had to have ears irrigated in the past. Today onset of sore throat. Patient took ibuprofen earlier today for reported temp of 100.

## 2023-04-18 ENCOUNTER — Emergency Department (HOSPITAL_COMMUNITY)
Admission: EM | Admit: 2023-04-18 | Discharge: 2023-04-19 | Disposition: A | Discharge status: Home / Self Care | Payer: 59 | Attending: Emergency Medicine | Admitting: Emergency Medicine

## 2023-04-18 ENCOUNTER — Other Ambulatory Visit: Payer: Self-pay

## 2023-04-18 ENCOUNTER — Encounter (HOSPITAL_COMMUNITY): Payer: Self-pay

## 2023-04-18 ENCOUNTER — Emergency Department (HOSPITAL_COMMUNITY): Payer: 59

## 2023-04-18 DIAGNOSIS — Z91040 Latex allergy status: Secondary | ICD-10-CM | POA: Insufficient documentation

## 2023-04-18 DIAGNOSIS — R112 Nausea with vomiting, unspecified: Secondary | ICD-10-CM | POA: Diagnosis present

## 2023-04-18 DIAGNOSIS — R55 Syncope and collapse: Secondary | ICD-10-CM | POA: Insufficient documentation

## 2023-04-18 DIAGNOSIS — Z7901 Long term (current) use of anticoagulants: Secondary | ICD-10-CM | POA: Insufficient documentation

## 2023-04-18 DIAGNOSIS — J0100 Acute maxillary sinusitis, unspecified: Secondary | ICD-10-CM | POA: Insufficient documentation

## 2023-04-18 LAB — COMPREHENSIVE METABOLIC PANEL
ALT: 14 U/L (ref 0–44)
AST: 20 U/L (ref 15–41)
Albumin: 3.5 g/dL (ref 3.5–5.0)
Alkaline Phosphatase: 56 U/L (ref 38–126)
Anion gap: 7 (ref 5–15)
BUN: 10 mg/dL (ref 6–20)
CO2: 24 mmol/L (ref 22–32)
Calcium: 8.9 mg/dL (ref 8.9–10.3)
Chloride: 108 mmol/L (ref 98–111)
Creatinine, Ser: 0.8 mg/dL (ref 0.44–1.00)
GFR, Estimated: >60 mL/min (ref >60)
Glucose, Bld: 88 mg/dL (ref 70–99)
Potassium: 3.5 mmol/L (ref 3.5–5.1)
Sodium: 139 mmol/L (ref 135–145)
Total Bilirubin: 0.3 mg/dL (ref <1.2)
Total Protein: 7 g/dL (ref 6.5–8.1)

## 2023-04-18 LAB — CBC
HCT: 41.2 % (ref 36.0–46.0)
Hemoglobin: 13.5 g/dL (ref 12.0–15.0)
MCH: 29.6 pg (ref 26.0–34.0)
MCHC: 32.8 g/dL (ref 30.0–36.0)
MCV: 90.4 fL (ref 80.0–100.0)
Platelets: 226 10*3/uL (ref 150–400)
RBC: 4.56 MIL/uL (ref 3.87–5.11)
RDW: 13.1 % (ref 11.5–15.5)
WBC: 6.3 10*3/uL (ref 4.0–10.5)
nRBC: 0 % (ref 0.0–0.2)

## 2023-04-18 LAB — LIPASE, BLOOD: Lipase: 30 U/L (ref 11–51)

## 2023-04-18 MED ORDER — ONDANSETRON 4 MG PO TBDP
4.0000 mg | ORAL_TABLET | Freq: Once | ORAL | Status: AC
  Administered 2023-04-18: 4 mg via ORAL
  Filled 2023-04-18: qty 1

## 2023-04-18 NOTE — ED Provider Notes (Incomplete)
[place] EMERGENCY DEPARTMENT AT [facility] Provider Note   CSN: [id number] Arrival date & time: 04/18/23  1644     History {Add pertinent medical, surgical, social history, OB history to HPI:1} Chief Complaint  Patient presents with  . Nausea  . Emesis  . Loss of Consciousness    [name] is a 57 y.o. female.  Patient presents to the ED today complaining of of nausea, vomiting, diarrhea, vertigo, near syncope for the last 3 days.  She states that she has had a URI for the last 2 weeks accompanied by productive cough.  Symptoms maintained and have gotten worse over the last few days leading to her having an episode where she stood up too quickly and fell over sideways hitting her head on her padded armchair around 12:00 this afternoon, which she described as a "light fall"..  She was previously on Eliquis but has stopped 2 weeks ago.  No LOC, but endorses paracervical muscle soreness.  Endorses maxillary sinus tenderness, ear fullness, tinnitus in right ear, headache with pain around the back of her ear.  She states her "memory is poor".  The history is provided by the patient.  Emesis Associated symptoms: diarrhea, myalgias and sore throat   Loss of Consciousness Associated symptoms: vomiting        Home Medications Prior to Admission medications   Medication Sig Start Date End Date Taking? Authorizing Provider  acetaminophen (TYLENOL) 650 MG CR tablet Take 1,300 mg by mouth as needed for pain.    [provider]  acetaminophen (TYLENOL) 650 MG CR tablet Take 1 tablet by mouth 3 (three) times daily.    [provider]  albuterol (PROVENTIL HFA) 108 (90 Base) MCG/ACT inhaler Inhale 2 puffs into the lungs every 4 (four) hours as needed for wheezing or shortness of breath.    [provider]  apixaban (ELIQUIS) 5 MG TABS tablet Take 2 tablets (10 mg total) by mouth 2 (two) times daily for 4 days, THEN 1 tablet (5 mg total) 2 (two)  times daily for 21 days. Patient taking differently: Take 5 mg by mouth once daily  06/04/22 11/11/22  Erick Alley, DO  apixaban (ELIQUIS) 5 MG TABS tablet Take 5 mg by mouth 2 (two) times daily.    [provider]  apixaban (ELIQUIS) 5 MG TABS tablet Take 5 mg by mouth 2 (two) times daily. 06/04/22   [provider]  Artificial Saliva (MUCOSITISRX) PACK Take 15 mLs by mouth as directed.  Swish and spit 15mL by mouth four times daily.    [provider]  azelastine (ASTELIN) 0.1 % nasal spray Place 2 sprays into both nostrils daily as needed for allergies. Use in each nostril as directed    [provider]  azelastine (ASTELIN) 0.1 % nasal spray Place 1 spray into both nostrils 2 (two) times daily. 04/16/22   [provider]  benzonatate (TESSALON) 100 MG capsule Take 100 mg by mouth 2 (two) times daily as needed for cough.    [provider]  BIOTIN PO Take 1 capsule by mouth daily.    [provider]  botulinum toxin Type A (BOTOX) 200 units injection Provider to inject 155 units into the muscles of the head and neck every 12 weeks. Discard remainder. 10/05/22   Butch Penny, NP  budesonide-formoterol (SYMBICORT) 80-4.5 MCG/ACT inhaler Inhale 2 puffs into the lungs as directed.    [provider]  buPROPion (WELLBUTRIN XL) 150 MG 24  hr tablet Take 3 tablets by mouth daily.    [provider]  calcium carbonate (TUMS - DOSED IN MG ELEMENTAL CALCIUM) 500 MG chewable tablet Chew 1,000 mg by mouth as needed for indigestion or heartburn.    [provider]  cetirizine (ZYRTEC) 10 MG tablet Take 10 mg by mouth daily. 10/20/21   [provider]  Cholecalciferol (VITAMIN D-1000 MAX ST) 25 MCG (1000 UT) tablet Take 1 tablet by mouth 2 (two) times daily.    [provider]  ciprofloxacin-dexamethasone (CIPRODEX) OTIC suspension Place 4 drops into the right ear 2 (two) times daily for 7 days. 04/15/23  04/22/23  Tomi Bamberger, PA-C  citalopram (CELEXA) 20 MG tablet Take 20 mg by mouth daily.    [provider]  CREON 36000-114000 units CPEP capsule Take 36,000 Units by mouth as directed. 01/20/23   [provider]  Cyanocobalamin (VITAMIN B-12 PO) Take 1 capsule by mouth daily.    [provider]  cyclobenzaprine (FLEXERIL) 10 MG tablet Take 10 mg by mouth 3 (three) times daily as needed for muscle spasms.    [provider]  diclofenac Sodium (VOLTAREN) 1 % GEL Apply 4 g topically 4 (four) times daily. Patient taking differently: Apply 1 Application topically as needed (pain). 06/04/22   Erick Alley, DO  diphenhydrAMINE (SOMINEX) 25 MG tablet Take 25 mg by mouth as needed for itching, allergies or sleep.    [provider]  divalproex (DEPAKOTE ER) 500 MG 24 hr tablet Take 2 tablets (1,000 mg total) by mouth at bedtime AND 1 tablet (500 mg total) daily. 11/11/22   Oletta Darter, MD  doxepin (SINEQUAN) 150 MG capsule Take 150 mg by mouth at bedtime. 06/25/22   [provider]  EPINEPHrine (EPIPEN 2-PAK) 0.3 mg/0.3 mL IJ SOAJ injection Inject 0.3 mg into the muscle as needed for anaphylaxis.    [provider]  Ergocalciferol (VITAMIN D2 PO) Take by mouth.    [provider]  estradiol (ESTRACE) 0.5 MG tablet Take 0.5 mg by mouth daily.    [provider]  fluticasone (FLONASE) 50 MCG/ACT nasal spray Place 1 spray into both nostrils as needed (allergies/runny nose). 06/04/22   Erick Alley, DO  gabapentin (NEURONTIN) 600 MG tablet Take 600 mg by mouth 3 (three) times daily.    [provider]  Galcanezumab-gnlm (EMGALITY) 120 MG/ML SOAJ INJECT 120 MG INTO THE SKIN EVERY 30 DAYS Patient taking differently: Inject 120 mg into the skin every 30 (thirty) days. 07/26/22   Anson Fret, MD  hydrochlorothiazide (HYDRODIURIL) 12.5 MG tablet Take 12.5 mg by mouth daily.    [provider]  hydrOXYzine  (ATARAX) 10 MG tablet Take 10 mg by mouth every 8 (eight) hours as needed for itching or anxiety. 03/01/23   [provider]  hydrOXYzine (ATARAX) 25 MG tablet Take 25 mg by mouth 3 (three) times daily.    [provider]  ibuprofen (ADVIL) 800 MG tablet Take 800 mg by mouth every 8 (eight) hours as needed for fever, headache, mild pain (pain score 1-3), moderate pain (pain score 4-6) or cramping. 01/20/15   [provider]  Lactobacillus (ACIDOPHILUS EXTRA STRENGTH) CAPS Take 1 capsule by mouth daily.    [provider]  levofloxacin (LEVAQUIN) 500 MG tablet Take 500 mg by mouth daily.    [provider]  lidocaine (LIDODERM) 5 % Place 1 patch onto the skin daily as needed (pain).    [provider]  lidocaine (LIDODERM) 5 % Place 1 patch onto the skin daily. 01/20/15   [provider]  meclizine (ANTIVERT) 25 MG tablet Take 25 mg by mouth 3 (three) times daily.    [provider]  Melatonin 10 MG CHEW Chew 30 mg by mouth at bedtime as needed (sleep).    [provider]  meloxicam (MOBIC) 15 MG tablet Take 15 mg by mouth daily. 02/03/23   [provider]  meloxicam (MOBIC) 7.5 MG tablet Take 7.5 mg by mouth daily.    [provider]  montelukast (SINGULAIR) 10 MG tablet Take 10 mg by mouth at bedtime as needed (allergies). 08/07/19   [provider]  montelukast (SINGULAIR) 10 MG tablet Take 1 tablet by mouth daily.    [provider]  Multiple Vitamin (MULTIVITAMIN) tablet Take 1 tablet by mouth in the morning.    [provider]  naloxegol oxalate (MOVANTIK) 25 MG TABS tablet Take 1 tablet by mouth every morning.    [provider]  naloxone [facility]) nasal spray 4 mg/0.1 mL Place 0.4 mg into the nose once. 06/19/18   [provider]  NUCYNTA 75 MG tablet Take 75 mg by mouth 2 (two) times daily as needed for moderate pain or severe pain.    [provider]  olopatadine (PATANOL) 0.1 % ophthalmic solution Place 1 drop into both eyes daily at 6 (six) AM.    [provider]  OLOPATADINE HCL OP Apply to eye.    [provider]  ondansetron (ZOFRAN) 4 MG tablet Take 1 tablet by mouth every 8 (eight) hours as needed.    [provider]  ondansetron (ZOFRAN-ODT) 4 MG disintegrating tablet Take 4 mg by mouth every 8 (eight) hours as needed for nausea or vomiting.    [provider]  orphenadrine (NORFLEX) 100 MG tablet Take 100 mg by mouth 2 (two) times daily as needed for muscle spasms or mild pain (pain score 1-3). 01/20/15   [provider]  oxyCODONE (OXY IR/ROXICODONE) 5 MG immediate release tablet Take 1 tablet (5 mg total) by mouth every 6 (six) hours as needed for breakthrough pain. 10/06/22   Maczis, Elmer Sow, PA-C  oxyCODONE-acetaminophen (PERCOCET/ROXICET) 5-325 MG tablet Take 1 tablet by mouth every 4 (four) hours as needed for severe pain (pain score 7-10).    [provider]  pantoprazole (PROTONIX) 40 MG tablet Take 40 mg by mouth daily.    [provider]  prazosin (MINIPRESS) 1 MG capsule Take 1 mg by mouth at bedtime. 02/11/23   [provider]  predniSONE (STERAPRED UNI-PAK 21 TAB) 10 MG (21) TBPK tablet Take 10 mg by mouth as directed. 12/15/22   [provider]  QUEtiapine (SEROQUEL) 50 MG tablet Take 50 mg by mouth 2 (two) times daily.    [provider]  raNITIdine HCl (RANITIDINE 150 MAX STRENGTH PO) Take 1 tablet by mouth daily.    [provider]  Rimegepant Sulfate (NURTEC) 75 MG TBDP Take 1 tablet (75 mg total) by mouth daily. Patient taking differently: Take 75 mg by mouth as needed (migraines). 08/05/22   Anson Fret, MD  tapentadol HCl (NUCYNTA) 75 MG tablet Take 75 mg by mouth 3 (three) times daily. 10/13/22   [provider]  tirzepatide Greggory Keen) 7.5 MG/0.5ML Pen Inject 7.5 mg into the skin once a week.    [provider]  topiramate (TOPAMAX) 50 MG tablet Take 50 mg by mouth  2 (two) times daily.    [provider]  triamcinolone cream (KENALOG) 0.1 % Apply 1 Application topically 2 (two) times daily.    [provider]  trolamine salicylate (ASPERCREME) 10 % cream Apply 1 Application topically as needed for muscle pain.    [provider]  Trolamine Salicylate 10 % AERO Apply topically.    [provider]      Allergies    Bee venom, Bioflavonoid products, Cocamide, Coconut (cocos nucifera), Coconut fatty acid, Coconut oil, Food, Mixed ragweed, Molds & smuts, Mushroom ext cmplx(shiitake-reishi-mait), Other, Shellfish allergy, Strawberry extract, Ambrosia artemisiifolia (ragweed) skin test, Oxycodone, Hydrocodone-acetaminophen, and Latex    Review of Systems   Review of Systems  HENT:  Positive for ear discharge, ear pain, sinus pressure, sinus pain, sore throat and tinnitus.   Cardiovascular:  Positive for syncope.  Gastrointestinal:  Positive for diarrhea and vomiting.  Musculoskeletal:  Positive for myalgias.  All other systems reviewed and are negative.   Physical Exam Updated Vital Signs BP 125/78 (BP Location: Right Arm)   Pulse 93   Temp 98.7 F (37.1 C)   Resp 18   SpO2 100%  Physical Exam Vitals and nursing note reviewed.  Constitutional:      Appearance: Normal appearance.  HENT:     Head: Normocephalic and atraumatic. No abrasion, contusion, masses or laceration. Hair is normal.     Jaw: No trismus, tenderness, swelling or pain on movement.     Right Ear: Tympanic membrane normal. Drainage (White drainage noted within the right EAC.) and tenderness (Exquisite tenderness over right mastoid, preauricular, and movement of ear) present. No swelling. There is mastoid tenderness (Right-sided). Tympanic membrane is not erythematous, retracted or bulging.     Left Ear: Tympanic membrane, ear canal and external ear normal. No drainage, swelling or  tenderness. Tympanic membrane is not erythematous, retracted or bulging.  Eyes:     Extraocular Movements: Extraocular movements intact.     Conjunctiva/sclera: Conjunctivae normal.  Cardiovascular:     Rate and Rhythm: Normal rate and regular rhythm.     Pulses: Normal pulses.     Heart sounds: Normal heart sounds. No murmur heard.    No friction rub. No gallop.  Pulmonary:     Effort: Pulmonary effort is normal. No respiratory distress.     Breath sounds: Normal breath sounds.  Abdominal:     General: Abdomen is flat.     Palpations: Abdomen is soft.     Tenderness: There is no abdominal tenderness.  Skin:    General: Skin is warm and dry.  Neurological:     General: No focal deficit present.     Mental Status: She is alert. Mental status is at baseline.  Psychiatric:        Mood and Affect: Mood normal.     ED Results / Procedures / Treatments   Labs (all labs ordered are listed, but only abnormal results are displayed) Labs Reviewed  LIPASE, BLOOD  COMPREHENSIVE METABOLIC PANEL  CBC  URINALYSIS, ROUTINE W REFLEX MICROSCOPIC    EKG None  Radiology No results found.  Procedures Procedures    Medications Ordered in ED Medications - No data to display  ED Course/ Medical Decision Making/ A&P                                 Medical Decision Making Amount and/or Complexity of Data Reviewed Labs: ordered.  Patient presents to the ED for concern of tinnitus, headache, URI, nausea, vomiting, near syncope, this involves an extensive number of treatment options, and is a complaint that carries with it a high risk of complications and morbidity.  The differential diagnosis includes labyrinthitis, Mnire's disease, intracerebral mass, head bleed, sinus infection, mastoiditis   Co morbidities that complicate the patient evaluation  Type 2 diabetes Recurrent syncope Bipolar/major depression/panic disorder/social anxiety disorder Migraine    Additional  history obtained:  Additional history obtained from  Nursing, Outside Medical Records, and Past Admission    Lab Tests:  I Ordered, and personally interpreted labs.  The pertinent results include:      Imaging Studies ordered:  I ordered imaging studies including CTA of head and temporal bones I independently visualized and interpreted imaging which showed *** I agree with the radiologist interpretation    Medicines ordered and prescription drug management:  I ordered medication including Zofran for nausea Reevaluation of the patient after these medicines showed that the patient improved I have reviewed the patients home medicines and have made adjustments as needed   Test Considered:  MRI, CT abdomen   Problem List / ED Course:  ***   Reevaluation:  After the interventions noted above, I reevaluated the patient and found that they have :{resolved/improved/worsened:23923::"improved"}   Social Determinants of Health:  ***   Dispostion:  After consideration of the diagnostic results and the patients response to treatment, I feel that the patent would benefit from ***.    {Document critical care time when appropriate:1} {Document review of labs and clinical decision tools ie heart score, Chads2Vasc2 etc:1}  {Document your independent review of radiology images, and any outside records:1} {Document your discussion with family members, caretakers, and with consultants:1} {Document social determinants of health affecting pt's care:1} {Document your decision making why or why not admission, treatments were needed:1} Final Clinical Impression(s) / ED Diagnoses Final diagnoses:  None    Rx / DC Orders ED Discharge Orders     None

## 2023-04-18 NOTE — ED Triage Notes (Addendum)
Pt c/o vomiting and diarrhea since Saturday; endorses 2 episodes of syncope yesterday and Saturday; has been taking antibiotics for recent ear infection; also c/o tooth pain, sinus pain, and sore throat; taking tylenol and motrin at home without relief; denies abdominal pain

## 2023-04-18 NOTE — ED Provider Notes (Signed)
[place] EMERGENCY DEPARTMENT AT [facility] Provider Note   CSN: [id number] Arrival date & time: 04/18/23  1644     History  Chief Complaint  Patient presents with   Nausea   Emesis   Loss of Consciousness    [name] is a 57 y.o. female.  Patient presents to the ED today complaining of of nausea, vomiting, diarrhea, vertigo, near syncope for the last 3 days.  She states that she has had a URI for the last 2 weeks accompanied by productive cough.  Symptoms maintained and have gotten worse over the last few days leading to her having an episode where she stood up too quickly and fell over sideways hitting her head on her padded armchair around 12:00 this afternoon, which she described as a "light fall"..  She was previously on Eliquis but has stopped 2 weeks ago.  No LOC, but endorses paracervical muscle soreness.  Endorses maxillary sinus tenderness, ear fullness, tinnitus in right ear, headache with pain around the back of her ear.  She states her "memory is poor".  The history is provided by the patient.  Emesis Associated symptoms: diarrhea, myalgias and sore throat   Loss of Consciousness Associated symptoms: vomiting        Home Medications Prior to Admission medications   Medication Sig Start Date End Date Taking? Authorizing Provider  amoxicillin-clavulanate (AUGMENTIN) 875-125 MG tablet Take 1 tablet by mouth every 12 (twelve) hours. 04/19/23  Yes Lunette Stands, PA-C  guaiFENesin (MUCINEX) 600 MG 12 hr tablet Take 1 tablet (600 mg total) by mouth 2 (two) times daily for 7 days. 04/19/23 04/26/23 Yes Lunette Stands, PA-C  acetaminophen (TYLENOL) 650 MG CR tablet Take 1,300 mg by mouth as needed for pain.    [provider]  acetaminophen (TYLENOL) 650 MG CR tablet Take 1 tablet by mouth 3 (three) times daily.    [provider]  albuterol (PROVENTIL HFA) 108 (90 Base) MCG/ACT inhaler Inhale 2 puffs into the lungs every 4 (four)  hours as needed for wheezing or shortness of breath.    [provider]  apixaban (ELIQUIS) 5 MG TABS tablet Take 2 tablets (10 mg total) by mouth 2 (two) times daily for 4 days, THEN 1 tablet (5 mg total) 2 (two) times daily for 21 days. Patient taking differently: Take 5 mg by mouth once daily  06/04/22 11/11/22  Erick Alley, DO  apixaban (ELIQUIS) 5 MG TABS tablet Take 5 mg by mouth 2 (two) times daily.    [provider]  apixaban (ELIQUIS) 5 MG TABS tablet Take 5 mg by mouth 2 (two) times daily. 06/04/22   [provider]  Artificial Saliva (MUCOSITISRX) PACK Take 15 mLs by mouth as directed.  Swish and spit 15mL by mouth four times daily.    [provider]  azelastine (ASTELIN) 0.1 % nasal spray Place 2 sprays into both nostrils daily as needed for allergies. Use in each nostril as directed    [provider]  azelastine (ASTELIN) 0.1 % nasal spray Place 1 spray into both nostrils 2 (two) times daily. 04/16/22   [provider]  benzonatate (TESSALON) 100 MG capsule Take 100 mg by mouth 2 (two) times daily as needed for cough.    [provider]  BIOTIN PO Take 1 capsule by mouth daily.    [provider]  botulinum toxin Type A (BOTOX) 200 units injection Provider to inject 155 units into the muscles  of the head and neck every 12 weeks. Discard remainder. 10/05/22   Butch Penny, NP  budesonide-formoterol (SYMBICORT) 80-4.5 MCG/ACT inhaler Inhale 2 puffs into the lungs as directed.    [provider]  buPROPion (WELLBUTRIN XL) 150 MG 24 hr tablet Take 3 tablets by mouth daily.    [provider]  calcium carbonate (TUMS - DOSED IN MG ELEMENTAL CALCIUM) 500 MG chewable tablet Chew 1,000 mg by mouth as needed for indigestion or heartburn.    [provider]  cetirizine (ZYRTEC) 10 MG tablet Take 10 mg by mouth daily. 10/20/21   [provider]  Cholecalciferol (VITAMIN D-1000 MAX ST) 25 MCG  (1000 UT) tablet Take 1 tablet by mouth 2 (two) times daily.    [provider]  ciprofloxacin-dexamethasone (CIPRODEX) OTIC suspension Place 4 drops into the right ear 2 (two) times daily for 7 days. 04/15/23 04/22/23  Tomi Bamberger, PA-C  citalopram (CELEXA) 20 MG tablet Take 20 mg by mouth daily.    [provider]  CREON 36000-114000 units CPEP capsule Take 36,000 Units by mouth as directed. 01/20/23   [provider]  Cyanocobalamin (VITAMIN B-12 PO) Take 1 capsule by mouth daily.    [provider]  cyclobenzaprine (FLEXERIL) 10 MG tablet Take 10 mg by mouth 3 (three) times daily as needed for muscle spasms.    [provider]  diclofenac Sodium (VOLTAREN) 1 % GEL Apply 4 g topically 4 (four) times daily. Patient taking differently: Apply 1 Application topically as needed (pain). 06/04/22   Erick Alley, DO  diphenhydrAMINE (SOMINEX) 25 MG tablet Take 25 mg by mouth as needed for itching, allergies or sleep.    [provider]  divalproex (DEPAKOTE ER) 500 MG 24 hr tablet Take 2 tablets (1,000 mg total) by mouth at bedtime AND 1 tablet (500 mg total) daily. 11/11/22   Oletta Darter, MD  doxepin (SINEQUAN) 150 MG capsule Take 150 mg by mouth at bedtime. 06/25/22   [provider]  EPINEPHrine (EPIPEN 2-PAK) 0.3 mg/0.3 mL IJ SOAJ injection Inject 0.3 mg into the muscle as needed for anaphylaxis.    [provider]  Ergocalciferol (VITAMIN D2 PO) Take by mouth.    [provider]  estradiol (ESTRACE) 0.5 MG tablet Take 0.5 mg by mouth daily.    [provider]  fluticasone (FLONASE) 50 MCG/ACT nasal spray Place 1 spray into both nostrils as needed (allergies/runny nose). 06/04/22   Erick Alley, DO  gabapentin (NEURONTIN) 600 MG tablet Take 600 mg by mouth 3 (three) times daily.    [provider]  Galcanezumab-gnlm (EMGALITY) 120 MG/ML SOAJ INJECT 120 MG INTO THE SKIN EVERY 30 DAYS Patient taking  differently: Inject 120 mg into the skin every 30 (thirty) days. 07/26/22   Anson Fret, MD  hydrochlorothiazide (HYDRODIURIL) 12.5 MG tablet Take 12.5 mg by mouth daily.    [provider]  hydrOXYzine (ATARAX) 10 MG tablet Take 10 mg by mouth every 8 (eight) hours as needed for itching or anxiety. 03/01/23   [provider]  hydrOXYzine (ATARAX) 25 MG tablet Take 25 mg by mouth 3 (three) times daily.    [provider]  ibuprofen (ADVIL) 800 MG tablet Take 800 mg by mouth every 8 (eight) hours as needed for fever, headache, mild pain (pain score 1-3), moderate pain (pain score 4-6) or cramping. 01/20/15   [provider]  Lactobacillus (ACIDOPHILUS EXTRA STRENGTH) CAPS Take 1 capsule by mouth daily.  [provider]  levofloxacin (LEVAQUIN) 500 MG tablet Take 500 mg by mouth daily.    [provider]  lidocaine (LIDODERM) 5 % Place 1 patch onto the skin daily as needed (pain).    [provider]  lidocaine (LIDODERM) 5 % Place 1 patch onto the skin daily. 01/20/15   [provider]  meclizine (ANTIVERT) 25 MG tablet Take 25 mg by mouth 3 (three) times daily.    [provider]  Melatonin 10 MG CHEW Chew 30 mg by mouth at bedtime as needed (sleep).    [provider]  meloxicam (MOBIC) 15 MG tablet Take 15 mg by mouth daily. 02/03/23   [provider]  meloxicam (MOBIC) 7.5 MG tablet Take 7.5 mg by mouth daily.    [provider]  montelukast (SINGULAIR) 10 MG tablet Take 10 mg by mouth at bedtime as needed (allergies). 08/07/19   [provider]  montelukast (SINGULAIR) 10 MG tablet Take 1 tablet by mouth daily.    [provider]  Multiple Vitamin (MULTIVITAMIN) tablet Take 1 tablet by mouth in the morning.    [provider]  naloxegol oxalate (MOVANTIK) 25 MG TABS tablet Take 1 tablet by mouth every morning.    [provider]  naloxone [facility]) nasal  spray 4 mg/0.1 mL Place 0.4 mg into the nose once. 06/19/18   [provider]  NUCYNTA 75 MG tablet Take 75 mg by mouth 2 (two) times daily as needed for moderate pain or severe pain.    [provider]  olopatadine (PATANOL) 0.1 % ophthalmic solution Place 1 drop into both eyes daily at 6 (six) AM.    [provider]  OLOPATADINE HCL OP Apply to eye.    [provider]  ondansetron (ZOFRAN) 4 MG tablet Take 1 tablet by mouth every 8 (eight) hours as needed.    [provider]  ondansetron (ZOFRAN-ODT) 4 MG disintegrating tablet Take 4 mg by mouth every 8 (eight) hours as needed for nausea or vomiting.    [provider]  orphenadrine (NORFLEX) 100 MG tablet Take 100 mg by mouth 2 (two) times daily as needed for muscle spasms or mild pain (pain score 1-3). 01/20/15   [provider]  oxyCODONE (OXY IR/ROXICODONE) 5 MG immediate release tablet Take 1 tablet (5 mg total) by mouth every 6 (six) hours as needed for breakthrough pain. 10/06/22   Maczis, Elmer Sow, PA-C  oxyCODONE-acetaminophen (PERCOCET/ROXICET) 5-325 MG tablet Take 1 tablet by mouth every 4 (four) hours as needed for severe pain (pain score 7-10).    [provider]  pantoprazole (PROTONIX) 40 MG tablet Take 40 mg by mouth daily.    [provider]  prazosin (MINIPRESS) 1 MG capsule Take 1 mg by mouth at bedtime. 02/11/23   [provider]  predniSONE (STERAPRED UNI-PAK 21 TAB) 10 MG (21) TBPK tablet Take 10 mg by mouth as directed. 12/15/22   [provider]  QUEtiapine (SEROQUEL) 50 MG tablet Take 50 mg by mouth 2 (two) times daily.    [provider]  raNITIdine HCl (RANITIDINE 150 MAX STRENGTH PO) Take 1 tablet by mouth daily.    [provider]  Rimegepant Sulfate (NURTEC) 75 MG TBDP Take 1 tablet (75 mg total) by mouth daily. Patient taking differently: Take 75 mg by mouth as needed (migraines). 08/05/22   Anson Fret,  MD  tapentadol HCl (NUCYNTA) 75 MG tablet Take 75 mg by mouth 3 (  three) times daily. 10/13/22   [provider]  tirzepatide Greggory Keen) 7.5 MG/0.5ML Pen Inject 7.5 mg into the skin once a week.    [provider]  topiramate (TOPAMAX) 50 MG tablet Take 50 mg by mouth 2 (two) times daily.    [provider]  triamcinolone cream (KENALOG) 0.1 % Apply 1 Application topically 2 (two) times daily.    [provider]  trolamine salicylate (ASPERCREME) 10 % cream Apply 1 Application topically as needed for muscle pain.    [provider]  Trolamine Salicylate 10 % AERO Apply topically.    [provider]      Allergies    Bee venom, Bioflavonoid products, Cocamide, Coconut (cocos nucifera), Coconut fatty acid, Coconut oil, Food, Mixed ragweed, Molds & smuts, Mushroom ext cmplx(shiitake-reishi-mait), Other, Shellfish allergy, Strawberry extract, Ambrosia artemisiifolia (ragweed) skin test, Oxycodone, Hydrocodone-acetaminophen, and Latex    Review of Systems   Review of Systems  HENT:  Positive for ear discharge, ear pain, sinus pressure, sinus pain, sore throat and tinnitus.   Cardiovascular:  Positive for syncope.  Gastrointestinal:  Positive for diarrhea and vomiting.  Musculoskeletal:  Positive for myalgias.  All other systems reviewed and are negative.   Physical Exam Updated Vital Signs BP 118/81 (BP Location: Left Arm)   Pulse 85   Temp 98.1 F (36.7 C) (Oral)   Resp 20   SpO2 100%  Physical Exam Vitals and nursing note reviewed.  Constitutional:      Appearance: Normal appearance.  HENT:     Head: Normocephalic and atraumatic. No abrasion, contusion, masses or laceration. Hair is normal.     Jaw: No trismus, tenderness, swelling or pain on movement.     Right Ear: Tympanic membrane normal. Drainage (White drainage noted within the right EAC.) and tenderness (Exquisite tenderness over right mastoid, preauricular, and movement of  ear) present. No swelling. There is mastoid tenderness (Right-sided). Tympanic membrane is not erythematous, retracted or bulging.     Left Ear: Tympanic membrane, ear canal and external ear normal. No drainage, swelling or tenderness. Tympanic membrane is not erythematous, retracted or bulging.  Eyes:     Extraocular Movements: Extraocular movements intact.     Conjunctiva/sclera: Conjunctivae normal.  Cardiovascular:     Rate and Rhythm: Normal rate and regular rhythm.     Pulses: Normal pulses.     Heart sounds: Normal heart sounds. No murmur heard.    No friction rub. No gallop.  Pulmonary:     Effort: Pulmonary effort is normal. No respiratory distress.     Breath sounds: Normal breath sounds.  Abdominal:     General: Abdomen is flat.     Palpations: Abdomen is soft.     Tenderness: There is no abdominal tenderness.  Skin:    General: Skin is warm and dry.  Neurological:     General: No focal deficit present.     Mental Status: She is alert. Mental status is at baseline.  Psychiatric:        Mood and Affect: Mood normal.     ED Results / Procedures / Treatments   Labs (all labs ordered are listed, but only abnormal results are displayed) Labs Reviewed  URINALYSIS, ROUTINE W REFLEX MICROSCOPIC - Abnormal; Notable for the following components:      Result Value   Color, Urine STRAW (*)    Specific Gravity, Urine 1.003 (*)    All other components within normal limits  LIPASE, BLOOD  COMPREHENSIVE METABOLIC  PANEL  CBC    EKG None  Radiology CT Temporal Bones Wo Contrast  Result Date: 04/19/2023 CLINICAL DATA:  Initial evaluation for pulsatile tinnitus. EXAM: CT TEMPORAL BONES WITHOUT CONTRAST TECHNIQUE: Axial and coronal plane CT imaging of the petrous temporal bones was performed with thin-collimation image reconstruction. No intravenous contrast was administered. Multiplanar CT image reconstructions were also generated. RADIATION DOSE REDUCTION: This exam was  performed according to the departmental dose-optimization program which includes automated exposure control, adjustment of the mA and/or kV according to patient size and/or use of iterative reconstruction technique. COMPARISON:  None Available. FINDINGS: RIGHT TEMPORAL BONE External auditory canal: Soft tissue density within the right EAC noted, likely cerumen. Secondary partial obscuration of the right tympanic membrane. No osseous erosion. Middle ear cavity: Right middle ear cavity clear. Ossicular chain intact. Tegmen tympani intact. Inner ear structures: Cochlea, vestibule, and semi circular canals within normal limits. Internal auditory and facial nerve canals: Right IAC within normal limits. Facial nerve canal intact and bony covered to the stylomastoid foramen. Mastoid air cells: Clear.  No coalescence or osseous erosion. LEFT TEMPORAL BONE External auditory canal: Mild soft tissue opacity within the left EAC, likely cerumen. Tympanic membrane thin and grossly intact. No osseous erosion. Middle ear cavity: Middle ear cavity clear. Ossicular chain intact. Tegmen tympani intact. Inner ear structures: Cochlea, vestibule, and semi circular canals within normal limits. Internal auditory and facial nerve canals: Left IAC within normal limits. Facial nerve canal intact and bony covered to the stylomastoid foramen. Mastoid air cells: Clear.  No osseous erosion or coalescence. Vascular: Unremarkable. Limited intracranial:  No visible abnormality. Visible orbits/paranasal sinuses: Unremarkable. Soft tissues: Unremarkable. IMPRESSION: 1. Soft tissue density within the EACs bilaterally, likely cerumen. 2. Otherwise unremarkable and normal CT of the temporal bones. No findings to explain patient's symptoms. Electronically Signed   By: Rise Mu M.D.   On: 04/19/2023 01:56   CT Head Wo Contrast  Result Date: 04/19/2023 CLINICAL DATA:  Trauma.  Vomiting. EXAM: CT HEAD WITHOUT CONTRAST TECHNIQUE: Contiguous  axial images were obtained from the base of the skull through the vertex without intravenous contrast. RADIATION DOSE REDUCTION: This exam was performed according to the departmental dose-optimization program which includes automated exposure control, adjustment of the mA and/or kV according to patient size and/or use of iterative reconstruction technique. COMPARISON:  Head CT 01/05/2023 FINDINGS: Brain: No evidence of acute infarction, hemorrhage, hydrocephalus, extra-axial collection or mass lesion/mass effect. Vascular: Atherosclerotic calcifications are present within the cavernous internal carotid arteries. Skull: Normal. Negative for fracture or focal lesion. Sinuses/Orbits: No acute finding. Other: None. IMPRESSION: No acute intracranial abnormality. Electronically Signed   By: Darliss Cheney M.D.   On: 04/19/2023 00:06    Procedures Procedures    Medications Ordered in ED Medications  ondansetron (ZOFRAN-ODT) disintegrating tablet 4 mg (4 mg Oral Given 04/18/23 2227)    ED Course/ Medical Decision Making/ A&P Clinical Course as of 04/19/23 0230  Tue Apr 19, 2023  0058 Urinalysis, Routine w reflex microscopic -Urine, Clean Catch [CB]    Clinical Course User Index [CB] Lunette Stands, PA-C                                 Medical Decision Making Amount and/or Complexity of Data Reviewed Labs: ordered. Decision-making details documented in ED Course. Radiology: ordered.  Risk OTC drugs. Prescription drug management.     Patient presents to the ED  for concern of tinnitus, headache, URI, nausea, vomiting, near syncope, this involves an extensive number of treatment options, and is a complaint that carries with it a high risk of complications and morbidity.  The differential diagnosis includes labyrinthitis, Mnire's disease, intracerebral mass, head bleed, sinus infection, mastoiditis   Co morbidities that complicate the patient evaluation  Type 2 diabetes Recurrent  syncope Bipolar/major depression/panic disorder/social anxiety disorder Migraine    Additional history obtained:  Additional history obtained from  Nursing, Outside Medical Records, and Past Admission    Lab Tests:  I Ordered, and personally interpreted labs.  The pertinent results include:      Imaging Studies ordered:  I ordered imaging studies including CT of head and temporal bones I independently visualized and interpreted imaging which showed no acute abnormalities.  Unremarkable, some light opacities similar to earwax noted in ears bilaterally. I agree with the radiologist interpretation    Medicines ordered and prescription drug management:  I ordered medication including Zofran for nausea Reevaluation of the patient after these medicines showed that the patient improved I have reviewed the patients home medicines and have made adjustments as needed   Test Considered:  MRI, CT abdomen   Problem List / ED Course:  Nausea, vomiting, tinnitus with episodic vertigo --patient is a 57 year old female presenting to the ED today complaining of nausea, vomiting, tinnitus, near syncope with episodic vertigo.  She says it started as a URI 2 weeks ago.  Then developed into having nausea, vomiting, tinnitus, near syncope which she described as her vision going "in and out" when standing up too quickly.  She then fell and hit her head on her soft armchair.  She is previously coagulated 2 weeks ago according to her was on Eliquis but has stopped taking since then.  She is uncertain as was not she should continue to take the medication.  She is also complaining of right sided mastoid tenderness as well as tenderness to palpation of the ear and preauricular.  Also endorses tenderness to palpation over the maxillary sinuses.  Endorses cough, fever, headache.she has a previous history of chronic migraines CT of head and temporal bones were done both were unremarkable.  Labs also  unremarkable.  Patient was then discharged on meclizine and Augmentin.  OTC Tylenol for headache.  Follow-up with neurology for further management of migraine medication refill.  She was told of strict return to ER precautions.   Reevaluation:  After the interventions noted above, I reevaluated the patient and found that they have :improved    Dispostion:  After consideration of the diagnostic results and the patients response to treatment, I feel that the patent would benefit from treatment noted as above in discharge.    Final Clinical Impression(s) / ED Diagnoses Final diagnoses:  Acute non-recurrent maxillary sinusitis    Rx / DC Orders ED Discharge Orders          Ordered    amoxicillin-clavulanate (AUGMENTIN) 875-125 MG tablet  Every 12 hours        04/19/23 0209    guaiFENesin (MUCINEX) 600 MG 12 hr tablet  2 times daily        04/19/23 0209              Lunette Stands, PA-C 04/19/23 0230    Cathren Laine, MD 04/19/23 [phone]

## 2023-04-19 DIAGNOSIS — J0100 Acute maxillary sinusitis, unspecified: Secondary | ICD-10-CM | POA: Diagnosis not present

## 2023-04-19 LAB — URINALYSIS, ROUTINE W REFLEX MICROSCOPIC
Bilirubin Urine: NEGATIVE
Glucose, UA: NEGATIVE mg/dL
Hgb urine dipstick: NEGATIVE
Ketones, ur: NEGATIVE mg/dL
Leukocytes,Ua: NEGATIVE
Nitrite: NEGATIVE
Protein, ur: NEGATIVE mg/dL
Specific Gravity, Urine: 1.003 — ABNORMAL LOW (ref 1.005–1.030)
pH: 7 (ref 5.0–8.0)

## 2023-04-19 MED ORDER — GUAIFENESIN ER 600 MG PO TB12
600.0000 mg | ORAL_TABLET | Freq: Two times a day (BID) | ORAL | 0 refills | Status: AC
Start: 2023-04-19 — End: 2023-04-26

## 2023-04-19 MED ORDER — AMOXICILLIN-POT CLAVULANATE 875-125 MG PO TABS
1.0000 | ORAL_TABLET | Freq: Two times a day (BID) | ORAL | 0 refills | Status: DC
Start: 2023-04-19 — End: 2023-04-26

## 2023-04-19 NOTE — Discharge Instructions (Addendum)
Take Augmentin 875 mg / 125 mg every 12 hours for the next 7 days.  Take with food.  Swallow whole do not crush or chew.  Can cause diarrhea, nausea, vomiting, upset stomach.  Eating food will help reduce this effect.  Although very rare, if you have an allergic reaction (rash, itching, swelling, difficulty breathing), have yellowing of the eyes, or severe diarrhea, unusual bleeding or bruising, or joint pain after taking the medication, return to the ER for further evaluation.  Take meclizine 25 mg once per day for dizziness/vertigo.  Can cause side effects of drowsiness, dry mouth, dizziness, blurry vision, constipation.  Do not consume alcohol, do not drive or operate heavy machinery if feeling drowsy while on meclizine.and be sure to drink plenty of fluids.  However rare it can also cause fast or irregular heartbeat, confusion, hallucinations, difficulty urinating, difficulty breathing, allergic reaction (swelling of face, throat, hives); if you experience the symptoms return to the ER for further evaluation.  If experiencing weakness, fatigue, worsening dizziness, unrelenting fever, shortness of breath, chest pain, return to the ER for further evaluation.  Follow-up with PCP for refilling migraine medication.  Use Tylenol PM for headache.

## 2023-04-21 ENCOUNTER — Encounter (HOSPITAL_COMMUNITY): Payer: Self-pay

## 2023-04-21 ENCOUNTER — Emergency Department (HOSPITAL_COMMUNITY): Payer: 59

## 2023-04-21 ENCOUNTER — Other Ambulatory Visit: Payer: Self-pay

## 2023-04-21 ENCOUNTER — Emergency Department (HOSPITAL_COMMUNITY)
Admission: EM | Admit: 2023-04-21 | Discharge: 2023-04-21 | Disposition: A | Discharge status: Home / Self Care | Payer: 59

## 2023-04-21 DIAGNOSIS — R42 Dizziness and giddiness: Secondary | ICD-10-CM | POA: Diagnosis not present

## 2023-04-21 DIAGNOSIS — Z91040 Latex allergy status: Secondary | ICD-10-CM | POA: Diagnosis not present

## 2023-04-21 DIAGNOSIS — Z7901 Long term (current) use of anticoagulants: Secondary | ICD-10-CM | POA: Insufficient documentation

## 2023-04-21 DIAGNOSIS — K922 Gastrointestinal hemorrhage, unspecified: Secondary | ICD-10-CM | POA: Insufficient documentation

## 2023-04-21 DIAGNOSIS — E876 Hypokalemia: Secondary | ICD-10-CM | POA: Insufficient documentation

## 2023-04-21 DIAGNOSIS — R195 Other fecal abnormalities: Secondary | ICD-10-CM | POA: Diagnosis present

## 2023-04-21 LAB — CBC WITH DIFFERENTIAL/PLATELET
Abs Immature Granulocytes: 0.01 10*3/uL (ref 0.00–0.07)
Basophils Absolute: 0 10*3/uL (ref 0.0–0.1)
Basophils Relative: 1 %
Eosinophils Absolute: 0.1 10*3/uL (ref 0.0–0.5)
Eosinophils Relative: 1 %
HCT: 38.7 % (ref 36.0–46.0)
Hemoglobin: 12.6 g/dL (ref 12.0–15.0)
Immature Granulocytes: 0 %
Lymphocytes Relative: 17 %
Lymphs Abs: 1.2 10*3/uL (ref 0.7–4.0)
MCH: 29.9 pg (ref 26.0–34.0)
MCHC: 32.6 g/dL (ref 30.0–36.0)
MCV: 91.7 fL (ref 80.0–100.0)
Monocytes Absolute: 0.9 10*3/uL (ref 0.1–1.0)
Monocytes Relative: 14 %
Neutro Abs: 4.4 10*3/uL (ref 1.7–7.7)
Neutrophils Relative %: 67 %
Platelets: 199 10*3/uL (ref 150–400)
RBC: 4.22 MIL/uL (ref 3.87–5.11)
RDW: 13.6 % (ref 11.5–15.5)
WBC: 6.6 10*3/uL (ref 4.0–10.5)
nRBC: 0 % (ref 0.0–0.2)

## 2023-04-21 LAB — COMPREHENSIVE METABOLIC PANEL
ALT: 14 U/L (ref 0–44)
AST: 20 U/L (ref 15–41)
Albumin: 3.6 g/dL (ref 3.5–5.0)
Alkaline Phosphatase: 55 U/L (ref 38–126)
Anion gap: 8 (ref 5–15)
BUN: 14 mg/dL (ref 6–20)
CO2: 24 mmol/L (ref 22–32)
Calcium: 9 mg/dL (ref 8.9–10.3)
Chloride: 108 mmol/L (ref 98–111)
Creatinine, Ser: 0.71 mg/dL (ref 0.44–1.00)
GFR, Estimated: >60 mL/min (ref >60)
Glucose, Bld: 88 mg/dL (ref 70–99)
Potassium: 3.1 mmol/L — ABNORMAL LOW (ref 3.5–5.1)
Sodium: 140 mmol/L (ref 135–145)
Total Bilirubin: 0.5 mg/dL (ref <1.2)
Total Protein: 7.3 g/dL (ref 6.5–8.1)

## 2023-04-21 LAB — TYPE AND SCREEN
ABO/RH(D): A POS
Antibody Screen: NEGATIVE

## 2023-04-21 LAB — LIPASE, BLOOD: Lipase: 43 U/L (ref 11–51)

## 2023-04-21 MED ORDER — ONDANSETRON HCL 4 MG/2ML IJ SOLN
4.0000 mg | Freq: Once | INTRAMUSCULAR | Status: AC
  Administered 2023-04-21: 4 mg via INTRAVENOUS
  Filled 2023-04-21: qty 2

## 2023-04-21 MED ORDER — IOHEXOL 300 MG/ML  SOLN
100.0000 mL | Freq: Once | INTRAMUSCULAR | Status: AC | PRN
  Administered 2023-04-21: 100 mL via INTRAVENOUS

## 2023-04-21 MED ORDER — PANTOPRAZOLE SODIUM 20 MG PO TBEC: 20.0000 mg | DELAYED_RELEASE_TABLET | Freq: Every day | ORAL | 0 refills | Status: AC

## 2023-04-21 MED ORDER — ACETAMINOPHEN 500 MG PO TABS
1000.0000 mg | ORAL_TABLET | Freq: Once | ORAL | Status: AC
  Administered 2023-04-21: 1000 mg via ORAL
  Filled 2023-04-21: qty 2

## 2023-04-21 MED ORDER — SODIUM CHLORIDE 0.9 % IV BOLUS
1000.0000 mL | Freq: Once | INTRAVENOUS | Status: AC
  Administered 2023-04-21: 1000 mL via INTRAVENOUS

## 2023-04-21 MED ORDER — POTASSIUM CHLORIDE CRYS ER 20 MEQ PO TBCR
40.0000 meq | EXTENDED_RELEASE_TABLET | Freq: Once | ORAL | Status: AC
  Administered 2023-04-21: 40 meq via ORAL
  Filled 2023-04-21: qty 4

## 2023-04-21 MED ORDER — ONDANSETRON HCL 4 MG PO TABS: 4.0000 mg | ORAL_TABLET | Freq: Four times a day (QID) | ORAL | 0 refills | Status: DC

## 2023-04-21 NOTE — ED Provider Notes (Signed)
[place] EMERGENCY DEPARTMENT AT [facility] Provider Note   CSN: [id number] Arrival date & time: 04/21/23  1657     History  Chief Complaint  Patient presents with   Blood In Stools    [name] is a 57 y.o. female.  Will keep working on-year-old female presenting emergency department for evaluation of dark tarry stools.  Reports that she has not felt well for the past week and a half or so with URI symptoms, nausea vomiting.  He seen urgent care several days ago and given antibiotic for otitis media.  She developed some dark tarry stools today.  Also having some epigastric abdominal pain.  Complains of some lightheadedness with standing.  Decreased p.o. intake since admission.  Was on Eliquis, but has been off for a little over 2 weeks.        Home Medications Prior to Admission medications   Medication Sig Start Date End Date Taking? Authorizing Provider  ondansetron (ZOFRAN) 4 MG tablet Take 1 tablet (4 mg total) by mouth every 6 (six) hours. 04/21/23  Yes Coral Spikes, DO  pantoprazole (PROTONIX) 20 MG tablet Take 1 tablet (20 mg total) by mouth daily. 04/21/23  Yes Coral Spikes, DO  acetaminophen (TYLENOL) 650 MG CR tablet Take 1,300 mg by mouth as needed for pain.    [provider]  acetaminophen (TYLENOL) 650 MG CR tablet Take 1 tablet by mouth 3 (three) times daily.    [provider]  albuterol (PROVENTIL HFA) 108 (90 Base) MCG/ACT inhaler Inhale 2 puffs into the lungs every 4 (four) hours as needed for wheezing or shortness of breath.    [provider]  amoxicillin-clavulanate (AUGMENTIN) 875-125 MG tablet Take 1 tablet by mouth every 12 (twelve) hours. 04/19/23   Lunette Stands, PA-C  apixaban (ELIQUIS) 5 MG TABS tablet Take 2 tablets (10 mg total) by mouth 2 (two) times daily for 4 days, THEN 1 tablet (5 mg total) 2 (two) times daily for 21 days. Patient taking differently: Take 5 mg by mouth once daily  06/04/22  11/11/22  Erick Alley, DO  apixaban (ELIQUIS) 5 MG TABS tablet Take 5 mg by mouth 2 (two) times daily.    [provider]  apixaban (ELIQUIS) 5 MG TABS tablet Take 5 mg by mouth 2 (two) times daily. 06/04/22   [provider]  Artificial Saliva (MUCOSITISRX) PACK Take 15 mLs by mouth as directed.  Swish and spit 15mL by mouth four times daily.    [provider]  azelastine (ASTELIN) 0.1 % nasal spray Place 2 sprays into both nostrils daily as needed for allergies. Use in each nostril as directed    [provider]  azelastine (ASTELIN) 0.1 % nasal spray Place 1 spray into both nostrils 2 (two) times daily. 04/16/22   [provider]  benzonatate (TESSALON) 100 MG capsule Take 100 mg by mouth 2 (two) times daily as needed for cough.    [provider]  BIOTIN PO Take 1 capsule by mouth daily.    [provider]  botulinum toxin Type A (BOTOX) 200 units injection Provider to inject 155 units into the muscles of the head and neck every 12 weeks. Discard remainder. 10/05/22   Butch Penny, NP  budesonide-formoterol (SYMBICORT) 80-4.5 MCG/ACT inhaler Inhale 2 puffs into the lungs as directed.    [provider]  buPROPion (WELLBUTRIN XL) 150 MG 24 hr tablet Take 3 tablets by mouth daily.  [provider]  calcium carbonate (TUMS - DOSED IN MG ELEMENTAL CALCIUM) 500 MG chewable tablet Chew 1,000 mg by mouth as needed for indigestion or heartburn.    [provider]  cetirizine (ZYRTEC) 10 MG tablet Take 10 mg by mouth daily. 10/20/21   [provider]  Cholecalciferol (VITAMIN D-1000 MAX ST) 25 MCG (1000 UT) tablet Take 1 tablet by mouth 2 (two) times daily.    [provider]  ciprofloxacin-dexamethasone (CIPRODEX) OTIC suspension Place 4 drops into the right ear 2 (two) times daily for 7 days. 04/15/23 04/22/23  Tomi Bamberger, PA-C  citalopram (CELEXA) 20 MG tablet Take 20 mg by mouth daily.     [provider]  CREON 36000-114000 units CPEP capsule Take 36,000 Units by mouth as directed. 01/20/23   [provider]  Cyanocobalamin (VITAMIN B-12 PO) Take 1 capsule by mouth daily.    [provider]  cyclobenzaprine (FLEXERIL) 10 MG tablet Take 10 mg by mouth 3 (three) times daily as needed for muscle spasms.    [provider]  diclofenac Sodium (VOLTAREN) 1 % GEL Apply 4 g topically 4 (four) times daily. Patient taking differently: Apply 1 Application topically as needed (pain). 06/04/22   Erick Alley, DO  diphenhydrAMINE (SOMINEX) 25 MG tablet Take 25 mg by mouth as needed for itching, allergies or sleep.    [provider]  divalproex (DEPAKOTE ER) 500 MG 24 hr tablet Take 2 tablets (1,000 mg total) by mouth at bedtime AND 1 tablet (500 mg total) daily. 11/11/22   Oletta Darter, MD  doxepin (SINEQUAN) 150 MG capsule Take 150 mg by mouth at bedtime. 06/25/22   [provider]  EPINEPHrine (EPIPEN 2-PAK) 0.3 mg/0.3 mL IJ SOAJ injection Inject 0.3 mg into the muscle as needed for anaphylaxis.    [provider]  Ergocalciferol (VITAMIN D2 PO) Take by mouth.    [provider]  estradiol (ESTRACE) 0.5 MG tablet Take 0.5 mg by mouth daily.    [provider]  fluticasone (FLONASE) 50 MCG/ACT nasal spray Place 1 spray into both nostrils as needed (allergies/runny nose). 06/04/22   Erick Alley, DO  gabapentin (NEURONTIN) 600 MG tablet Take 600 mg by mouth 3 (three) times daily.    [provider]  Galcanezumab-gnlm (EMGALITY) 120 MG/ML SOAJ INJECT 120 MG INTO THE SKIN EVERY 30 DAYS Patient taking differently: Inject 120 mg into the skin every 30 (thirty) days. 07/26/22   Anson Fret, MD  guaiFENesin (MUCINEX) 600 MG 12 hr tablet Take 1 tablet (600 mg total) by mouth 2 (two) times daily for 7 days. 04/19/23 04/26/23  Lunette Stands, PA-C  hydrochlorothiazide (HYDRODIURIL) 12.5 MG tablet Take 12.5 mg by  mouth daily.    [provider]  hydrOXYzine (ATARAX) 10 MG tablet Take 10 mg by mouth every 8 (eight) hours as needed for itching or anxiety. 03/01/23   [provider]  hydrOXYzine (ATARAX) 25 MG tablet Take 25 mg by mouth 3 (three) times daily.    [provider]  ibuprofen (ADVIL) 800 MG tablet Take 800 mg by mouth every 8 (eight) hours as needed for fever, headache, mild pain (pain score 1-3), moderate pain (pain score 4-6) or cramping. 01/20/15   [provider]  Lactobacillus (ACIDOPHILUS EXTRA STRENGTH) CAPS Take 1 capsule by mouth daily.    [provider]  levofloxacin (LEVAQUIN) 500 MG tablet Take 500 mg by mouth daily.    [provider]  lidocaine (LIDODERM) 5 % Place 1 patch onto the skin daily as needed (pain).    [provider]  lidocaine (LIDODERM) 5 % Place 1 patch onto the skin daily. 01/20/15   [provider]  meclizine (ANTIVERT) 25 MG tablet Take 25 mg by mouth 3 (three) times daily.    [provider]  Melatonin 10 MG CHEW Chew 30 mg by mouth at bedtime as needed (sleep).    [provider]  meloxicam (MOBIC) 15 MG tablet Take 15 mg by mouth daily. 02/03/23   [provider]  meloxicam (MOBIC) 7.5 MG tablet Take 7.5 mg by mouth daily.    [provider]  montelukast (SINGULAIR) 10 MG tablet Take 10 mg by mouth at bedtime as needed (allergies). 08/07/19   [provider]  montelukast (SINGULAIR) 10 MG tablet Take 1 tablet by mouth daily.    [provider]  Multiple Vitamin (MULTIVITAMIN) tablet Take 1 tablet by mouth in the morning.    [provider]  naloxegol oxalate (MOVANTIK) 25 MG TABS tablet Take 1 tablet by mouth every morning.    [provider]  naloxone [facility]) nasal spray 4 mg/0.1 mL Place 0.4 mg into the nose once. 06/19/18   [provider]  NUCYNTA 75 MG tablet Take 75 mg by mouth 2 (two) times daily as needed for  moderate pain or severe pain.    [provider]  olopatadine (PATANOL) 0.1 % ophthalmic solution Place 1 drop into both eyes daily at 6 (six) AM.    [provider]  OLOPATADINE HCL OP Apply to eye.    [provider]  ondansetron (ZOFRAN-ODT) 4 MG disintegrating tablet Take 4 mg by mouth every 8 (eight) hours as needed for nausea or vomiting.    [provider]  orphenadrine (NORFLEX) 100 MG tablet Take 100 mg by mouth 2 (two) times daily as needed for muscle spasms or mild pain (pain score 1-3). 01/20/15   [provider]  oxyCODONE (OXY IR/ROXICODONE) 5 MG immediate release tablet Take 1 tablet (5 mg total) by mouth every 6 (six) hours as needed for breakthrough pain. 10/06/22   Maczis, Elmer Sow, PA-C  oxyCODONE-acetaminophen (PERCOCET/ROXICET) 5-325 MG tablet Take 1 tablet by mouth every 4 (four) hours as needed for severe pain (pain score 7-10).    [provider]  prazosin (MINIPRESS) 1 MG capsule Take 1 mg by mouth at bedtime. 02/11/23   [provider]  predniSONE (STERAPRED UNI-PAK 21 TAB) 10 MG (21) TBPK tablet Take 10 mg by mouth as directed. 12/15/22   [provider]  QUEtiapine (SEROQUEL) 50 MG tablet Take 50 mg by mouth 2 (two) times daily.    [provider]  raNITIdine HCl (RANITIDINE 150 MAX STRENGTH PO) Take 1 tablet by mouth daily.    [provider]  Rimegepant Sulfate (NURTEC) 75 MG TBDP Take 1 tablet (75 mg total) by mouth daily. Patient taking differently: Take 75 mg by mouth as needed (migraines). 08/05/22   Anson Fret, MD  tapentadol HCl (NUCYNTA) 75 MG tablet Take 75 mg by mouth 3 (three) times daily. 10/13/22   [provider]  tirzepatide Greggory Keen) 7.5 MG/0.5ML Pen Inject 7.5 mg into the skin once a week.    [provider]  topiramate (TOPAMAX) 50 MG tablet Take 50 mg by mouth 2 (two) times daily.    [provider]  triamcinolone cream (KENALOG) 0.1 %  Apply 1 Application topically 2 (two)  times daily.    [provider]  trolamine salicylate (ASPERCREME) 10 % cream Apply 1 Application topically as needed for muscle pain.    [provider]  Trolamine Salicylate 10 % AERO Apply topically.    [provider]      Allergies    Bee venom, Bioflavonoid products, Cocamide, Coconut (cocos nucifera), Coconut fatty acid, Coconut oil, Food, Mixed ragweed, Molds & smuts, Mushroom ext cmplx(shiitake-reishi-mait), Other, Shellfish allergy, Strawberry extract, Ambrosia artemisiifolia (ragweed) skin test, Oxycodone, Hydrocodone-acetaminophen, and Latex    Review of Systems   Review of Systems  Physical Exam Updated Vital Signs BP 139/84   Pulse 90   Temp 98.7 F (37.1 C) (Oral)   Resp 17   Ht 5\' 6"  (1.676 m)   Wt 77.1 kg   SpO2 100%   BMI 27.44 kg/m  Physical Exam Vitals and nursing note reviewed.  Constitutional:      General: She is not in acute distress.    Appearance: She is not toxic-appearing.  HENT:     Head: Normocephalic.     Mouth/Throat:     Mouth: Mucous membranes are moist.  Eyes:     Conjunctiva/sclera: Conjunctivae normal.  Cardiovascular:     Rate and Rhythm: Normal rate and regular rhythm.  Abdominal:     General: Abdomen is flat. There is no distension.     Tenderness: There is no abdominal tenderness. There is no guarding or rebound.  Musculoskeletal:        General: Normal range of motion.  Skin:    General: Skin is warm.     Capillary Refill: Capillary refill takes less than 2 seconds.  Neurological:     Mental Status: She is alert.  Psychiatric:        Mood and Affect: Mood normal.     ED Results / Procedures / Treatments   Labs (all labs ordered are listed, but only abnormal results are displayed) Labs Reviewed  COMPREHENSIVE METABOLIC PANEL - Abnormal; Notable for the following components:      Result Value   Potassium 3.1 (*)    All other components within normal limits   CBC WITH DIFFERENTIAL/PLATELET  LIPASE, BLOOD  TYPE AND SCREEN    EKG EKG Interpretation Date/Time:  Thursday April 21 2023 17:46:37 EST Ventricular Rate:  83 PR Interval:  176 QRS Duration:  86 QT Interval:  369 QTC Calculation: 434 R Axis:   -2  Text Interpretation: Sinus rhythm Abnormal R-wave progression, early transition Probable LVH with secondary repol abnrm no STEMI. previously seen  nonspecific t wave changes Confirmed by Arby Barrette [phone]) on 04/21/2023 7:05:35 PM  Radiology CT ABDOMEN PELVIS W CONTRAST  Result Date: 04/21/2023 CLINICAL DATA:  Bowel obstruction suspected "Patient reports she has been feeling unwell x 12 days. Seen 3 days ago for similar. States since she has noticed black tarry stools and abdominal pain" EXAM: CT ABDOMEN AND PELVIS WITH CONTRAST TECHNIQUE: Multidetector CT imaging of the abdomen and pelvis was performed using the standard protocol following bolus administration of intravenous contrast. RADIATION DOSE REDUCTION: This exam was performed according to the departmental dose-optimization program which includes automated exposure control, adjustment of the mA and/or kV according to patient size and/or use of iterative reconstruction technique. CONTRAST:  OMNIPAQUE IOHEXOL 300 MG/ML  SOLN COMPARISON:  CT abdomen pelvis 10/19/2022 FINDINGS: Lower chest: Elevated left hemidiaphragm. Hepatobiliary: Mildly diffusely hypodense hepatic parenchyma compared to the spleen. No focal liver abnormality. Status post cholecystectomy. No biliary dilatation.  Pancreas: No focal lesion. Normal pancreatic contour. No surrounding inflammatory changes. No main pancreatic ductal dilatation. Spleen: Normal in size without focal abnormality. Adrenals/Urinary Tract: No adrenal nodule bilaterally. Bilateral kidneys enhance symmetrically. No hydronephrosis. No hydroureter. The urinary bladder is unremarkable. On delayed imaging, there is no urothelial wall thickening  and there are no filling defects in the opacified portions of the bilateral collecting systems or ureters. Stomach/Bowel: Roux-en-Y gastric bypass. Stomach is within normal limits. No evidence of bowel wall thickening or dilatation. Appendix appears normal. Punctate appendicolith noted. Vascular/Lymphatic: No abdominal aorta or iliac aneurysm. No abdominal, pelvic, or inguinal lymphadenopathy. Reproductive: Status post hysterectomy. No adnexal masses. Other: No intraperitoneal free fluid. No intraperitoneal free gas. No organized fluid collection. Musculoskeletal: No abdominal wall hernia or abnormality. No suspicious lytic or blastic osseous lesions. No acute displaced fracture. Multilevel degenerative changes of the spine. IMPRESSION: 1. No acute intra-abdominal or intrapelvic abnormality. 2. Possible mild hepatic steatosis. 3. Status post cholecystectomy, Roux-en-Y gastric bypass, and hysterectomy. Electronically Signed   By: Tish Frederickson M.D.   On: 04/21/2023 19:16    Procedures Procedures    Medications Ordered in ED Medications  ondansetron (ZOFRAN) injection 4 mg (4 mg Intravenous Given 04/21/23 1754)  sodium chloride 0.9 % bolus 1,000 mL (0 mLs Intravenous Stopped 04/21/23 2029)  iohexol (OMNIPAQUE) 300 MG/ML solution 100 mL (100 mLs Intravenous Contrast Given 04/21/23 1855)  potassium chloride SA (KLOR-CON M) CR tablet 40 mEq (40 mEq Oral Given 04/21/23 1908)  acetaminophen (TYLENOL) tablet 1,000 mg (1,000 mg Oral Given 04/21/23 2125)    ED Course/ Medical Decision Making/ A&P Clinical Course as of 04/21/23 2322  Thu Apr 21, 2023  1816 CBC with Differential No anemia [TY]  1923 CT ABDOMEN PELVIS W CONTRAST IMPRESSION: 1. No acute intra-abdominal or intrapelvic abnormality. 2. Possible mild hepatic steatosis. 3. Status post cholecystectomy, Roux-en-Y gastric bypass, and hysterectomy.   [TY]  1923 Comprehensive metabolic panel(!) Hypokalemia; repleted. No transaminitis to  suggest hepatobiliary disease.  [TY]  1924 Lipase: 43 Pancreatitis unlikely.  [TY]  2010 Labs reassuring as noted in ED course.  Patient improved after fluids and Zofran.  Stable for discharge.  Follow-up with GI outpatient.  Counseled on avoiding NSAIDs; she has been taking ibuprofen frequently.  Return precautions given.  Will start on Protonix [TY]    Clinical Course User Index [TY] Coral Spikes, DO                                 Medical Decision Making Is a well-appearing 57 year old female presenting emergency department for evaluation of.  Afebrile nontachycardic tensive.  Physical exam with some mild epigastric abdominal tenderness.  Per chart review does have a history of gastric bypass.  Not currently on Eliquis.  Will get screening labs, type and screen transfuse indicated.  Given prior surgical history we will also get CT abdomen.  Zofran and IV fluids for supportive care.  See ED course for further MDM disposition.  Amount and/or Complexity of Data Reviewed Labs: ordered. Decision-making details documented in ED Course. Radiology: ordered. Decision-making details documented in ED Course. ECG/medicine tests: ordered.  Risk OTC drugs. Prescription drug management. Decision regarding hospitalization. Diagnosis or treatment significantly limited by social determinants of health.          Final Clinical Impression(s) / ED Diagnoses Final diagnoses:  Hypokalemia  UGI bleed    Rx / DC Orders ED Discharge Orders  Ordered    pantoprazole (PROTONIX) 20 MG tablet  Daily        04/21/23 2015    ondansetron (ZOFRAN) 4 MG tablet  Every 6 hours        04/21/23 2015              Coral Spikes, DO 04/21/23 2323

## 2023-04-21 NOTE — ED Triage Notes (Signed)
Patient reports she has been feeling unwell x 12 days. Seen 3 days ago for similar. States since she has noticed black tarry stools and abdominal pain.

## 2023-04-21 NOTE — Discharge Instructions (Addendum)
Please follow-up with your primary doctor and gastroenterology.  Please call and schedule appointment with gastroenterologist soon as possible.  You are prescribed you medications, symptom relief.  Please stop taking ibuprofen and Motrin/BC Goody powders as they can worsen bleeding.  Immediately for fevers, chills, chest pain, shortness of breath, worsening abdominal pain, lightheadedness, passout, worsening bleeding/bright red blood per rectum or any new or worsening symptoms that are concerning to you.

## 2023-04-23 NOTE — ED Provider Notes (Signed)
[name]    CSN: [id number] Arrival date & time: 04/15/23  1707      History   Chief Complaint Chief Complaint  Patient presents with   Ear Drainage   Otalgia    Right ear    HPI [name] is a 57 y.o. female.   Patient here today for evaluation of right ear pain and drainage that started 4 days ago.  She reports she has had some sore throat that started today.  She also had some fever earlier with a Tmax of 100.  She notes she took ibuprofen without resolution of pain  The history is provided by the patient.  Ear Drainage Pertinent negatives include no abdominal pain.  Otalgia Associated symptoms: ear discharge, fever and sore throat   Associated symptoms: no abdominal pain, no congestion, no cough and no vomiting     Past Medical History:  Diagnosis Date   Acute kidney injury (HCC) 10/25/2020   Acute respiratory disease due to COVID-19 virus 10/25/2020   AKI (acute kidney injury) (HCC) 10/25/2020   Allergic rhinitis    Allergic to cats    pet dander   Allergy    Anemia    Anxiety    Arthritis 09/2018   both feet   Asthma    Back pain    Bipolar disorder (HCC)    Cervicalgia    Chronic fatigue syndrome    Chronic low back pain with left-sided sciatica    Class 3 severe obesity with serious comorbidity and body mass index (BMI) of 40.0 to 44.9 in adult [facility]) 11/14/2014   Constipation    Depression    Diabetes mellitus without complication (HCC)    "pre"   Dyspnea    Environmental allergies    Fibromyalgia    GERD (gastroesophageal reflux disease)    Grave's disease    Heart murmur    High cholesterol    History of blood clots    Hypertension    Joint pain    Lactose intolerance    Lower extremity edema    Multiple food allergies    OSA (obstructive sleep apnea)    Osteoporosis    Palpitations    Panic disorder    PE (pulmonary thromboembolism) (HCC)    on Eliquis   Pharyngoesophageal dysphagia 06/24/2014   Prediabetes  05/11/2019   Protein-calorie malnutrition, mild (HCC) 06/23/2021   Pulmonary embolism (HCC) 2024   Rheumatoid arthritis (HCC)    Risk for falls    Sciatica    Severe 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 (HCC)    Sleep apnea    no cpap worn   Subclinical hypothyroidism 05/11/2019   Syncope    Syncope and collapse    Thyroid disease    Vasovagal syncope    Vertigo    Vitamin D deficiency     Patient Active Problem List   Diagnosis Date Noted   Cholecystitis, acute with cholelithiasis 10/03/2022   Choledocholithiasis 10/02/2022   Acute left ankle pain 06/02/2022   Pulmonary embolism (HCC) 06/01/2022   Acute pain of left foot 05/27/2022   Type 2 diabetes mellitus without complications (HCC) 05/26/2022   Anemia 05/26/2022   Urinary retention 05/26/2022   Syncope and collapse 05/25/2022   Lumbosacral radiculopathy at L5 01/28/2022   Lumbosacral radiculopathy at S1 01/28/2022   Multiple neurological symptoms 06/23/2021   Metabolic syndrome 10/21/2020   Constipation 07/09/2020   OSA (obstructive sleep apnea) 05/11/2019   Intractable chronic migraine without  aura and with status migrainosus 01/14/2016   Risk for falls 09/02/2015   Insomnia 05/27/2015   Bipolar disorder (HCC) 02/25/2015   Severe 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 (HCC) 10/10/2014   GAD (generalized anxiety disorder) 10/10/2014   Panic disorder with agoraphobia 10/10/2014   Social anxiety disorder 10/10/2014   Asthma 08/01/2014   Low back pain 07/24/2014   Encounter for monitoring opioid maintenance therapy 07/02/2014   S/P gastric bypass 06/24/2014   Hx of recurrent syncope 05/24/2013   Drug overdose, multiple drugs 11/01/2012   Migraine 05/22/2012   Depression 11/22/2011   Chronic pain 11/22/2011   Graves' disease    GERD (gastroesophageal reflux disease)    Need for prophylactic postmenopausal hormone replacement therapy 11/03/2011   Allergic rhinitis  09/28/2011   Essential hypertension 09/28/2011    Past Surgical History:  Procedure Laterality Date   ABDOMINAL HYSTERECTOMY  2006   CHOLECYSTECTOMY N/A 10/04/2022   Procedure: LAPAROSCOPIC CHOLECYSTECTOMY WITH INTRAOPERATIVE CHOLANGIOGRAM;  Surgeon: Manus Rudd, MD;  Location: [facility] OR;  Service: General;  Laterality: N/A;   COLONOSCOPY     GASTRIC BYPASS  2008    OB History     Gravida  2   Para  2   Term      Preterm      AB      Living  2      SAB      IAB      Ectopic      Multiple      Live Births               Home Medications    Prior to Admission medications   Medication Sig Start Date End Date Taking? Authorizing Provider  acetaminophen (TYLENOL) 650 MG CR tablet Take 1,300 mg by mouth as needed for pain.    [provider]  acetaminophen (TYLENOL) 650 MG CR tablet Take 1 tablet by mouth 3 (three) times daily.    [provider]  albuterol (PROVENTIL HFA) 108 (90 Base) MCG/ACT inhaler Inhale 2 puffs into the lungs every 4 (four) hours as needed for wheezing or shortness of breath.    [provider]  amoxicillin-clavulanate (AUGMENTIN) 875-125 MG tablet Take 1 tablet by mouth every 12 (twelve) hours. 04/19/23   Lunette Stands, PA-C  apixaban (ELIQUIS) 5 MG TABS tablet Take 2 tablets (10 mg total) by mouth 2 (two) times daily for 4 days, THEN 1 tablet (5 mg total) 2 (two) times daily for 21 days. Patient taking differently: Take 5 mg by mouth once daily  06/04/22 11/11/22  Erick Alley, DO  apixaban (ELIQUIS) 5 MG TABS tablet Take 5 mg by mouth 2 (two) times daily.    [provider]  apixaban (ELIQUIS) 5 MG TABS tablet Take 5 mg by mouth 2 (two) times daily. 06/04/22   [provider]  Artificial Saliva (MUCOSITISRX) PACK Take 15 mLs by mouth as directed.  Swish and spit 15mL by mouth four times daily.    [provider]  azelastine (ASTELIN) 0.1 % nasal spray Place 2 sprays into both nostrils daily  as needed for allergies. Use in each nostril as directed    [provider]  azelastine (ASTELIN) 0.1 % nasal spray Place 1 spray into both nostrils 2 (two) times daily. 04/16/22   [provider]  benzonatate (TESSALON) 100 MG capsule Take 100 mg by mouth 2 (two) times daily as needed for cough.    [provider]  BIOTIN PO Take 1 capsule by mouth daily.    [provider]  botulinum toxin Type A (BOTOX) 200 units injection Provider to inject 155 units into the muscles of the head and neck every 12 weeks. Discard remainder. 10/05/22   Butch Penny, NP  budesonide-formoterol (SYMBICORT) 80-4.5 MCG/ACT inhaler Inhale 2 puffs into the lungs as directed.    [provider]  buPROPion (WELLBUTRIN XL) 150 MG 24 hr tablet Take 3 tablets by mouth daily.    [provider]  calcium carbonate (TUMS - DOSED IN MG ELEMENTAL CALCIUM) 500 MG chewable tablet Chew 1,000 mg by mouth as needed for indigestion or heartburn.    [provider]  cetirizine (ZYRTEC) 10 MG tablet Take 10 mg by mouth daily. 10/20/21   [provider]  Cholecalciferol (VITAMIN D-1000 MAX ST) 25 MCG (1000 UT) tablet Take 1 tablet by mouth 2 (two) times daily.    [provider]  citalopram (CELEXA) 20 MG tablet Take 20 mg by mouth daily.    [provider]  CREON 36000-114000 units CPEP capsule Take 36,000 Units by mouth as directed. 01/20/23   [provider]  Cyanocobalamin (VITAMIN B-12 PO) Take 1 capsule by mouth daily.    [provider]  cyclobenzaprine (FLEXERIL) 10 MG tablet Take 10 mg by mouth 3 (three) times daily as needed for muscle spasms.    [provider]  diclofenac Sodium (VOLTAREN) 1 % GEL Apply 4 g topically 4 (four) times daily. Patient taking differently: Apply 1 Application topically as needed (pain). 06/04/22   Erick Alley, DO  diphenhydrAMINE (SOMINEX) 25 MG tablet Take 25 mg by mouth as needed for  itching, allergies or sleep.    [provider]  divalproex (DEPAKOTE ER) 500 MG 24 hr tablet Take 2 tablets (1,000 mg total) by mouth at bedtime AND 1 tablet (500 mg total) daily. 11/11/22   Oletta Darter, MD  doxepin (SINEQUAN) 150 MG capsule Take 150 mg by mouth at bedtime. 06/25/22   [provider]  EPINEPHrine (EPIPEN 2-PAK) 0.3 mg/0.3 mL IJ SOAJ injection Inject 0.3 mg into the muscle as needed for anaphylaxis.    [provider]  Ergocalciferol (VITAMIN D2 PO) Take by mouth.    [provider]  estradiol (ESTRACE) 0.5 MG tablet Take 0.5 mg by mouth daily.    [provider]  fluticasone (FLONASE) 50 MCG/ACT nasal spray Place 1 spray into both nostrils as needed (allergies/runny nose). 06/04/22   Erick Alley, DO  gabapentin (NEURONTIN) 600 MG tablet Take 600 mg by mouth 3 (three) times daily.    [provider]  Galcanezumab-gnlm (EMGALITY) 120 MG/ML SOAJ INJECT 120 MG INTO THE SKIN EVERY 30 DAYS Patient taking differently: Inject 120 mg into the skin every 30 (thirty) days. 07/26/22   Anson Fret, MD  guaiFENesin (MUCINEX) 600 MG 12 hr tablet Take 1 tablet (600 mg total) by mouth 2 (two) times daily for 7 days. 04/19/23 04/26/23  Lunette Stands, PA-C  hydrochlorothiazide (HYDRODIURIL) 12.5 MG tablet Take 12.5 mg by mouth daily.    [provider]  hydrOXYzine (ATARAX) 10 MG tablet Take 10 mg by mouth every 8 (eight) hours as needed for itching or anxiety. 03/01/23   [provider]  hydrOXYzine (ATARAX) 25 MG tablet Take 25 mg by mouth 3 (three) times daily.    [provider]  ibuprofen (ADVIL) 800 MG tablet Take 800 mg by mouth every 8 (eight) hours  as needed for fever, headache, mild pain (pain score 1-3), moderate pain (pain score 4-6) or cramping. 01/20/15   [provider]  Lactobacillus (ACIDOPHILUS EXTRA STRENGTH) CAPS Take 1 capsule by mouth daily.    [provider]  levofloxacin  (LEVAQUIN) 500 MG tablet Take 500 mg by mouth daily.    [provider]  lidocaine (LIDODERM) 5 % Place 1 patch onto the skin daily as needed (pain).    [provider]  lidocaine (LIDODERM) 5 % Place 1 patch onto the skin daily. 01/20/15   [provider]  meclizine (ANTIVERT) 25 MG tablet Take 25 mg by mouth 3 (three) times daily.    [provider]  Melatonin 10 MG CHEW Chew 30 mg by mouth at bedtime as needed (sleep).    [provider]  meloxicam (MOBIC) 15 MG tablet Take 15 mg by mouth daily. 02/03/23   [provider]  meloxicam (MOBIC) 7.5 MG tablet Take 7.5 mg by mouth daily.    [provider]  montelukast (SINGULAIR) 10 MG tablet Take 10 mg by mouth at bedtime as needed (allergies). 08/07/19   [provider]  montelukast (SINGULAIR) 10 MG tablet Take 1 tablet by mouth daily.    [provider]  Multiple Vitamin (MULTIVITAMIN) tablet Take 1 tablet by mouth in the morning.    [provider]  naloxegol oxalate (MOVANTIK) 25 MG TABS tablet Take 1 tablet by mouth every morning.    [provider]  naloxone [facility]) nasal spray 4 mg/0.1 mL Place 0.4 mg into the nose once. 06/19/18   [provider]  NUCYNTA 75 MG tablet Take 75 mg by mouth 2 (two) times daily as needed for moderate pain or severe pain.    [provider]  olopatadine (PATANOL) 0.1 % ophthalmic solution Place 1 drop into both eyes daily at 6 (six) AM.    [provider]  OLOPATADINE HCL OP Apply to eye.    [provider]  ondansetron (ZOFRAN) 4 MG tablet Take 1 tablet (4 mg total) by mouth every 6 (six) hours. 04/21/23   Coral Spikes, DO  ondansetron (ZOFRAN-ODT) 4 MG disintegrating tablet Take 4 mg by mouth every 8 (eight) hours as needed for nausea or vomiting.    [provider]  orphenadrine (NORFLEX) 100 MG tablet Take 100 mg by mouth 2 (two) times daily as needed for muscle  spasms or mild pain (pain score 1-3). 01/20/15   [provider]  oxyCODONE (OXY IR/ROXICODONE) 5 MG immediate release tablet Take 1 tablet (5 mg total) by mouth every 6 (six) hours as needed for breakthrough pain. 10/06/22   Maczis, Elmer Sow, PA-C  oxyCODONE-acetaminophen (PERCOCET/ROXICET) 5-325 MG tablet Take 1 tablet by mouth every 4 (four) hours as needed for severe pain (pain score 7-10).    [provider]  pantoprazole (PROTONIX) 20 MG tablet Take 1 tablet (20 mg total) by mouth daily. 04/21/23   Coral Spikes, DO  prazosin (MINIPRESS) 1 MG capsule Take 1 mg by mouth at bedtime. 02/11/23   [provider]  predniSONE (STERAPRED UNI-PAK 21 TAB) 10 MG (21) TBPK tablet Take 10 mg by mouth as directed. 12/15/22   [provider]  QUEtiapine (SEROQUEL) 50 MG tablet Take 50 mg by mouth 2 (two) times daily.    [provider]  raNITIdine HCl (RANITIDINE 150 MAX STRENGTH PO) Take 1 tablet by mouth daily.    [provider]  Rimegepant Sulfate (  NURTEC) 75 MG TBDP Take 1 tablet (75 mg total) by mouth daily. Patient taking differently: Take 75 mg by mouth as needed (migraines). 08/05/22   Anson Fret, MD  tapentadol HCl (NUCYNTA) 75 MG tablet Take 75 mg by mouth 3 (three) times daily. 10/13/22   [provider]  tirzepatide Greggory Keen) 7.5 MG/0.5ML Pen Inject 7.5 mg into the skin once a week.    [provider]  topiramate (TOPAMAX) 50 MG tablet Take 50 mg by mouth 2 (two) times daily.    [provider]  triamcinolone cream (KENALOG) 0.1 % Apply 1 Application topically 2 (two) times daily.    [provider]  trolamine salicylate (ASPERCREME) 10 % cream Apply 1 Application topically as needed for muscle pain.    [provider]  Trolamine Salicylate 10 % AERO Apply topically.    [provider]    Family History Family History  Problem Relation Age of Onset   Hypertension Mother     Cirrhosis Mother    Alcohol abuse Mother    Depression Mother    Physical abuse Mother    Hyperlipidemia Mother    Thyroid disease Mother    Anxiety disorder Mother    Arthritis Mother    Hypertension Father    Alcohol abuse Father    Hyperlipidemia Father    Depression Father    Drug abuse Father    Arthritis Father    COPD Father    Gout Father    Hypertension Sister    Alcohol abuse Sister    Drug abuse Sister    Depression Sister    Anxiety disorder Sister    OCD Sister    Thyroid disease Sister    Depression Sister    Thyroid disease Sister    Stroke Maternal Grandmother    Heart attack Maternal Grandmother    Diabetes Maternal Uncle    Stroke Maternal Uncle    Colon cancer Maternal Uncle    Seizures Cousin    Breast cancer Cousin    Breast cancer Cousin    ADD / ADHD Other    Diabetes Other    Hypertension Other    Dementia Neg Hx    Esophageal cancer Neg Hx    Rectal cancer Neg Hx    Stomach cancer Neg Hx     Social History Social History   Tobacco Use   Smoking status: Never   Smokeless tobacco: Never  Vaping Use   Vaping status: Never Used  Substance Use Topics   Alcohol use: Not Currently    Comment: 1-2 drinks/month   Drug use: No     Allergies   Bee venom, Bioflavonoid products, Cocamide, Coconut (cocos nucifera), Coconut fatty acid, Coconut oil, Food, Mixed ragweed, Molds & smuts, Mushroom ext cmplx(shiitake-reishi-mait), Other, Shellfish allergy, Strawberry extract, Ambrosia artemisiifolia (ragweed) skin test, Oxycodone, Hydrocodone-acetaminophen, and Latex   Review of Systems Review of Systems  Constitutional:  Positive for fever. Negative for chills.  HENT:  Positive for ear discharge, ear pain and sore throat. Negative for congestion.   Eyes:  Negative for discharge and redness.  Respiratory:  Negative for cough.   Gastrointestinal:  Negative for abdominal pain, nausea and vomiting.     Physical Exam Triage Vital Signs ED  Triage Vitals  Encounter Vitals Group     BP 04/15/23 1758 107/76     Systolic BP Percentile --      Diastolic BP Percentile --      Pulse Rate  04/15/23 1758 86     Resp 04/15/23 1758 18     Temp 04/15/23 1758 98.1 F (36.7 C)     Temp Source 04/15/23 1758 Oral     SpO2 04/15/23 1758 96 %     Weight --      Height --      Head Circumference --      Peak Flow --      Pain Score 04/15/23 1801 9     Pain Loc --      Pain Education --      Exclude from Growth Chart --    No data found.  Updated Vital Signs BP 107/76 (BP Location: Right Arm)   Pulse 86   Temp 98.1 F (36.7 C) (Oral)   Resp 18   SpO2 96%   Visual Acuity Right Eye Distance:   Left Eye Distance:   Bilateral Distance:    Right Eye Near:   Left Eye Near:    Bilateral Near:     Physical Exam Vitals and nursing note reviewed.  Constitutional:      General: She is not in acute distress.    Appearance: Normal appearance. She is not ill-appearing.  HENT:     Head: Normocephalic and atraumatic.     Left Ear: Tympanic membrane and ear canal normal.     Ears:     Comments: Right EAC erythematous and inflamed    Nose: Nose normal. No congestion or rhinorrhea.     Mouth/Throat:     Mouth: Mucous membranes are moist.     Pharynx: Oropharynx is clear. No oropharyngeal exudate or posterior oropharyngeal erythema.  Eyes:     Conjunctiva/sclera: Conjunctivae normal.  Cardiovascular:     Rate and Rhythm: Normal rate.  Pulmonary:     Effort: Pulmonary effort is normal.  Neurological:     Mental Status: She is alert.  Psychiatric:        Mood and Affect: Mood normal.        Behavior: Behavior normal.        Thought Content: Thought content normal.      UC Treatments / Results  Labs (all labs ordered are listed, but only abnormal results are displayed) Labs Reviewed - No data to display  EKG   Radiology CT ABDOMEN PELVIS W CONTRAST  Result Date: 04/21/2023 CLINICAL DATA:  Bowel obstruction  suspected "Patient reports she has been feeling unwell x 12 days. Seen 3 days ago for similar. States since she has noticed black tarry stools and abdominal pain" EXAM: CT ABDOMEN AND PELVIS WITH CONTRAST TECHNIQUE: Multidetector CT imaging of the abdomen and pelvis was performed using the standard protocol following bolus administration of intravenous contrast. RADIATION DOSE REDUCTION: This exam was performed according to the departmental dose-optimization program which includes automated exposure control, adjustment of the mA and/or kV according to patient size and/or use of iterative reconstruction technique. CONTRAST:  OMNIPAQUE IOHEXOL 300 MG/ML  SOLN COMPARISON:  CT abdomen pelvis 10/19/2022 FINDINGS: Lower chest: Elevated left hemidiaphragm. Hepatobiliary: Mildly diffusely hypodense hepatic parenchyma compared to the spleen. No focal liver abnormality. Status post cholecystectomy. No biliary dilatation. Pancreas: No focal lesion. Normal pancreatic contour. No surrounding inflammatory changes. No main pancreatic ductal dilatation. Spleen: Normal in size without focal abnormality. Adrenals/Urinary Tract: No adrenal nodule bilaterally. Bilateral kidneys enhance symmetrically. No hydronephrosis. No hydroureter. The urinary bladder is unremarkable. On delayed imaging, there is no urothelial wall thickening and there are no filling defects in the  opacified portions of the bilateral collecting systems or ureters. Stomach/Bowel: Roux-en-Y gastric bypass. Stomach is within normal limits. No evidence of bowel wall thickening or dilatation. Appendix appears normal. Punctate appendicolith noted. Vascular/Lymphatic: No abdominal aorta or iliac aneurysm. No abdominal, pelvic, or inguinal lymphadenopathy. Reproductive: Status post hysterectomy. No adnexal masses. Other: No intraperitoneal free fluid. No intraperitoneal free gas. No organized fluid collection. Musculoskeletal: No abdominal wall hernia or abnormality.  No suspicious lytic or blastic osseous lesions. No acute displaced fracture. Multilevel degenerative changes of the spine. IMPRESSION: 1. No acute intra-abdominal or intrapelvic abnormality. 2. Possible mild hepatic steatosis. 3. Status post cholecystectomy, Roux-en-Y gastric bypass, and hysterectomy. Electronically Signed   By: Tish Frederickson M.D.   On: 04/21/2023 19:16    Procedures Procedures (including critical care time)  Medications Ordered in UC Medications - No data to display  Initial Impression / Assessment and Plan / UC Course  I have reviewed the triage vital signs and the nursing notes.  Pertinent labs & imaging results that were available during my care of the patient were reviewed by me and considered in my medical decision making (see chart for details).    Will treat to cover otitis externa with Ciprodex drops.  Recommended follow-up if no gradual improvement or with any further concerns.  Final Clinical Impressions(s) / UC Diagnoses   Final diagnoses:  Otitis, externa, infective, right   Discharge Instructions   None    ED Prescriptions     Medication Sig Dispense Auth. Provider   ciprofloxacin-dexamethasone (CIPRODEX) OTIC suspension Place 4 drops into the right ear 2 (two) times daily for 7 days. 7.5 mL Tomi Bamberger, PA-C      PDMP not reviewed this encounter.   Tomi Bamberger, PA-C 04/23/23 [phone]

## 2023-04-25 ENCOUNTER — Telehealth: Payer: Self-pay | Admitting: Adult Health

## 2023-04-25 NOTE — Telephone Encounter (Signed)
Pt called stating that she has not received her monthly Inj in about two months and has had a headache for about 6 days and would like the MD or RN to give her a call back to discuss.

## 2023-04-26 ENCOUNTER — Ambulatory Visit (INDEPENDENT_AMBULATORY_CARE_PROVIDER_SITE_OTHER): Payer: 59 | Admitting: *Deleted

## 2023-04-26 DIAGNOSIS — G43711 Chronic migraine without aura, intractable, with status migrainosus: Secondary | ICD-10-CM

## 2023-04-26 MED ORDER — KETOROLAC TROMETHAMINE 60 MG/2ML IM SOLN
30.0000 mg | Freq: Once | INTRAMUSCULAR | Status: AC
  Administered 2023-04-26: 30 mg via INTRAMUSCULAR

## 2023-04-26 MED ORDER — EMGALITY 120 MG/ML ~~LOC~~ SOAJ: SUBCUTANEOUS | 11 refills | Status: DC

## 2023-04-26 MED ORDER — KETOROLAC TROMETHAMINE 60 MG/2ML IM SOLN: 30.0000 mg | Freq: Once | INTRAMUSCULAR | Status: DC

## 2023-04-26 NOTE — Addendum Note (Signed)
Addended by: Guy Begin on: 04/26/2023 04:43 PM   Modules accepted: Orders

## 2023-04-26 NOTE — Progress Notes (Signed)
Pt here for Toradol 30mg  IM injection.  Under aseptic technique 30mg   IM given  R upper outer quadrant (gluteal) .  Tolerated well.  Bandaid applied.  Pt very anxious.  She asked about getting xanax.  I told her we give for her botox, if she needs somethinkg more then see pcp.  Pt verbalized understanding. Pt to call back if needed.  Has 05-10-2023 appt for botox. She asked to put in order for her botox xanax.  I relayed that I can but it may not be addressed till gets closer.  She verbalized understanding.

## 2023-04-26 NOTE — Telephone Encounter (Signed)
I called pt and she is having 7 day migraine.  She had last botox -01-2023, due in 04-2023.  Her last emgality was 2 months ago.  Needs new rx.  (Sent).  Her migraine is similar presentation, top L head, between eyes, level 8-9. Phono-photo sensitivity.  Has taken tylenol migraine, nucynta, not helping.  Nurtec not taking (last fill 07/2022). She has had toradol injection that has helped in past.  Requesting this as option.  I relayed that ask Aundra Millet, NP

## 2023-04-26 NOTE — Telephone Encounter (Signed)
Yes. 30 mg

## 2023-04-26 NOTE — Telephone Encounter (Signed)
Spoke with pt and she will come in about 1600 for toradol injection 30mg .  Appt Nurse visit schedule.

## 2023-05-03 NOTE — Telephone Encounter (Signed)
Received auth renewal approval from [facility]. Case ID #: [id number] (05/03/23-08/01/23)

## 2023-05-05 ENCOUNTER — Institutional Professional Consult (permissible substitution): Payer: 59 | Admitting: Plastic Surgery

## 2023-05-06 DIAGNOSIS — F4312 Post-traumatic stress disorder, chronic: Secondary | ICD-10-CM | POA: Insufficient documentation

## 2023-05-06 DIAGNOSIS — F3132 Bipolar disorder, current episode depressed, moderate: Secondary | ICD-10-CM | POA: Insufficient documentation

## 2023-05-10 ENCOUNTER — Ambulatory Visit: Payer: 59 | Admitting: Adult Health

## 2023-05-12 ENCOUNTER — Ambulatory Visit
Admission: RE | Admit: 2023-05-12 | Discharge: 2023-05-12 | Disposition: A | Discharge status: Home / Self Care | Payer: 59 | Source: Ambulatory Visit | Attending: Nurse Practitioner | Admitting: Nurse Practitioner

## 2023-05-12 ENCOUNTER — Other Ambulatory Visit: Payer: Self-pay | Admitting: Nurse Practitioner

## 2023-05-12 ENCOUNTER — Ambulatory Visit (INDEPENDENT_AMBULATORY_CARE_PROVIDER_SITE_OTHER): Payer: 59 | Admitting: Family Medicine

## 2023-05-12 DIAGNOSIS — Z1231 Encounter for screening mammogram for malignant neoplasm of breast: Secondary | ICD-10-CM

## 2023-05-12 DIAGNOSIS — G43711 Chronic migraine without aura, intractable, with status migrainosus: Secondary | ICD-10-CM

## 2023-05-12 MED ORDER — ONABOTULINUMTOXINA 200 UNITS IJ SOLR
155.0000 [IU] | Freq: Once | INTRAMUSCULAR | Status: AC
  Administered 2023-05-12: 155 [IU] via INTRAMUSCULAR

## 2023-05-12 NOTE — Progress Notes (Signed)
05/12/23 ALL: She presents for Botox. She reports migraines are stable. She continues to have 4-8 migrainous headaches. She questions if Botox is working for her. She does state they are less intense. She continues topiramate and Emgality. Nurtec works well for abortive therapy. She takes Xanax prior to procedure. Her son brought her to her appt.     Consent Form Botulism Toxin Injection For Chronic Migraine    Reviewed orally with patient, additionally signature is on file:  Botulism toxin has been approved by the Federal drug administration for treatment of chronic migraine. Botulism toxin does not cure chronic migraine and it may not be effective in some patients.  The administration of botulism toxin is accomplished by injecting a small amount of toxin into the muscles of the neck and head. Dosage must be titrated for each individual. Any benefits resulting from botulism toxin tend to wear off after 3 months with a repeat injection required if benefit is to be maintained. Injections are usually done every 3-4 months with maximum effect peak achieved by about 2 or 3 weeks. Botulism toxin is expensive and you should be sure of what costs you will incur resulting from the injection.  The side effects of botulism toxin use for chronic migraine may include:   -Transient, and usually mild, facial weakness with facial injections  -Transient, and usually mild, head or neck weakness with head/neck injections  -Reduction or loss of forehead facial animation due to forehead muscle weakness  -Eyelid drooping  -Dry eye  -Pain at the site of injection or bruising at the site of injection  -Double vision  -Potential unknown long term risks   Contraindications: You should not have Botox if you are pregnant, nursing, allergic to albumin, have an infection, skin condition, or muscle weakness at the site of the injection, or have myasthenia gravis, Lambert-Eaton syndrome, or ALS.  It is also  possible that as with any injection, there may be an allergic reaction or no effect from the medication. Reduced effectiveness after repeated injections is sometimes seen and rarely infection at the injection site may occur. All care will be taken to prevent these side effects. If therapy is given over a long time, atrophy and wasting in the muscle injected may occur. Occasionally the patient's become refractory to treatment because they develop antibodies to the toxin. In this event, therapy needs to be modified.  I have read the above information and consent to the administration of botulism toxin.    BOTOX PROCEDURE NOTE FOR MIGRAINE HEADACHE  Contraindications and precautions discussed with patient(above). Aseptic procedure was observed and patient tolerated procedure. Procedure performed by Shawnie Dapper, FNP-C.   The condition has existed for more than 6 months, and pt does not have a diagnosis of ALS, Myasthenia Gravis or Lambert-Eaton Syndrome.  Risks and benefits of injections discussed and pt agrees to proceed with the procedure.  Written consent obtained  These injections are medically necessary. Pt  receives good benefits from these injections. These injections do not cause sedations or hallucinations which the oral therapies may cause.   Description of procedure:  The patient was placed in a sitting position. The standard protocol was used for Botox as follows, with 5 units of Botox injected at each site:  -Procerus muscle, midline injection  -Corrugator muscle, bilateral injection  -Frontalis muscle, bilateral injection, with 2 sites each side, medial injection was performed in the upper one third of the frontalis muscle, in the region vertical from the medial  inferior edge of the superior orbital rim. The lateral injection was again in the upper one third of the forehead vertically above the lateral limbus of the cornea, 1.5 cm lateral to the medial injection site.  -Temporalis  muscle injection, 4 sites, bilaterally. The first injection was 3 cm above the tragus of the ear, second injection site was 1.5 cm to 3 cm up from the first injection site in line with the tragus of the ear. The third injection site was 1.5-3 cm forward between the first 2 injection sites. The fourth injection site was 1.5 cm posterior to the second injection site. 5th site laterally in the temporalis  muscleat the level of the outer canthus.  -Occipitalis muscle injection, 3 sites, bilaterally. The first injection was done one half way between the occipital protuberance and the tip of the mastoid process behind the ear. The second injection site was done lateral and superior to the first, 1 fingerbreadth from the first injection. The third injection site was 1 fingerbreadth superiorly and medially from the first injection site.  -Cervical paraspinal muscle injection, 2 sites, bilaterally. The first injection site was 1 cm from the midline of the cervical spine, 3 cm inferior to the lower border of the occipital protuberance. The second injection site was 1.5 cm superiorly and laterally to the first injection site.  -Trapezius muscle injection was performed at 3 sites, bilaterally. The first injection site was in the upper trapezius muscle halfway between the inflection point of the neck, and the acromion. The second injection site was one half way between the acromion and the first injection site. The third injection was done between the first injection site and the inflection point of the neck.   Will return for repeat injection in 3 months.   A total of 200 units of Botox was prepared, 155 units of Botox was injected as documented above, any Botox not injected was wasted. The patient tolerated the procedure well, there were no complications of the above procedure.

## 2023-05-12 NOTE — Progress Notes (Signed)
Botox- 200 units x 1 vial Lot: [id number] Expiration: 08/2025 NDC: 8413-2440-10  Bacteriostatic 0.9% Sodium Chloride- * mL  UVO:[id number] Expiration: 03/24/2024 NDC: 0347-4259-56  Dx:G43.711 S/P Witnessed LO:[id number] Neale Burly, CMA

## 2023-05-16 ENCOUNTER — Ambulatory Visit (INDEPENDENT_AMBULATORY_CARE_PROVIDER_SITE_OTHER): Payer: 59 | Admitting: Plastic Surgery

## 2023-05-16 VITALS — BP 118/80 | HR 98 | Ht 66.0 in | Wt 172.0 lb

## 2023-05-16 DIAGNOSIS — Z9884 Bariatric surgery status: Secondary | ICD-10-CM

## 2023-05-16 DIAGNOSIS — L987 Excessive and redundant skin and subcutaneous tissue: Secondary | ICD-10-CM | POA: Diagnosis not present

## 2023-05-19 ENCOUNTER — Other Ambulatory Visit: Payer: Self-pay | Admitting: Nurse Practitioner

## 2023-05-19 DIAGNOSIS — R928 Other abnormal and inconclusive findings on diagnostic imaging of breast: Secondary | ICD-10-CM

## 2023-05-27 ENCOUNTER — Encounter: Payer: Self-pay | Admitting: Family Medicine

## 2023-05-30 ENCOUNTER — Ambulatory Visit
Admission: RE | Admit: 2023-05-30 | Discharge: 2023-05-30 | Disposition: A | Discharge status: Home / Self Care | Payer: 59 | Source: Ambulatory Visit | Attending: Nurse Practitioner | Admitting: Nurse Practitioner

## 2023-05-30 ENCOUNTER — Other Ambulatory Visit: Payer: Self-pay | Admitting: Nurse Practitioner

## 2023-05-30 DIAGNOSIS — R921 Mammographic calcification found on diagnostic imaging of breast: Secondary | ICD-10-CM

## 2023-05-30 DIAGNOSIS — R928 Other abnormal and inconclusive findings on diagnostic imaging of breast: Secondary | ICD-10-CM

## 2023-05-30 HISTORY — PX: BREAST BIOPSY: SHX20

## 2023-05-31 LAB — SURGICAL PATHOLOGY

## 2023-06-06 ENCOUNTER — Ambulatory Visit: Payer: 59 | Admitting: Physical Therapy

## 2023-06-09 ENCOUNTER — Institutional Professional Consult (permissible substitution): Payer: 59 | Admitting: Plastic Surgery

## 2023-06-13 ENCOUNTER — Ambulatory Visit: Payer: 59

## 2023-06-15 ENCOUNTER — Telehealth: Payer: Self-pay | Admitting: Adult Health

## 2023-06-15 NOTE — Telephone Encounter (Signed)
LVM and sent mychart msg informing pt of 08/18/23 appt change - NP schedule change

## 2023-06-21 ENCOUNTER — Other Ambulatory Visit: Payer: Self-pay

## 2023-06-21 ENCOUNTER — Ambulatory Visit: Payer: 59 | Attending: Family Medicine

## 2023-06-21 DIAGNOSIS — M797 Fibromyalgia: Secondary | ICD-10-CM | POA: Insufficient documentation

## 2023-06-21 DIAGNOSIS — R252 Cramp and spasm: Secondary | ICD-10-CM | POA: Insufficient documentation

## 2023-06-21 DIAGNOSIS — R262 Difficulty in walking, not elsewhere classified: Secondary | ICD-10-CM | POA: Insufficient documentation

## 2023-06-21 DIAGNOSIS — M6281 Muscle weakness (generalized): Secondary | ICD-10-CM | POA: Diagnosis present

## 2023-06-21 DIAGNOSIS — R296 Repeated falls: Secondary | ICD-10-CM | POA: Insufficient documentation
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
## 2023-07-07 ENCOUNTER — Telehealth: Payer: Self-pay | Admitting: Family Medicine

## 2023-07-07 NOTE — Telephone Encounter (Signed)
[facility] [place]) Would like a call back to schedule delivery of Botox and verify dosage.

## 2023-07-07 NOTE — Telephone Encounter (Signed)
Returned call to [place], delivery is pending pt consent. Sent MyChart msg asking her to call [facility] @ [phone].  Reminder: please let me know when pharmacy is on the phone with questions so I can pick up call rather than calling them back to avoid long hold times.

## 2023-07-08 ENCOUNTER — Encounter (HOSPITAL_BASED_OUTPATIENT_CLINIC_OR_DEPARTMENT_OTHER): Payer: Self-pay | Admitting: Physical Therapy

## 2023-07-08 ENCOUNTER — Ambulatory Visit: Payer: 59 | Admitting: Physical Therapy

## 2023-07-08 ENCOUNTER — Ambulatory Visit (HOSPITAL_BASED_OUTPATIENT_CLINIC_OR_DEPARTMENT_OTHER): Payer: 59 | Attending: Family Medicine | Admitting: Physical Therapy

## 2023-07-08 DIAGNOSIS — R296 Repeated falls: Secondary | ICD-10-CM | POA: Insufficient documentation

## 2023-07-08 DIAGNOSIS — R262 Difficulty in walking, not elsewhere classified: Secondary | ICD-10-CM | POA: Insufficient documentation

## 2023-07-08 DIAGNOSIS — M797 Fibromyalgia: Secondary | ICD-10-CM | POA: Diagnosis present
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
## 2023-07-19 ENCOUNTER — Telehealth: Payer: Self-pay | Admitting: Neurology

## 2023-07-19 NOTE — Telephone Encounter (Addendum)
 Pt called, lvm on yesterday at 3:40pm  asking to r/s her Botox appointment as it was a conflict with another appointment.  Aundra Millet , NP had nothing before June.  Pt accepted Dr Trevor Mace next available with wait list.

## 2023-07-20 ENCOUNTER — Encounter (HOSPITAL_BASED_OUTPATIENT_CLINIC_OR_DEPARTMENT_OTHER): Payer: Self-pay | Admitting: Physical Therapy

## 2023-07-20 ENCOUNTER — Encounter (HOSPITAL_BASED_OUTPATIENT_CLINIC_OR_DEPARTMENT_OTHER): Payer: 59 | Attending: Family Medicine | Admitting: Physical Therapy

## 2023-07-20 DIAGNOSIS — R296 Repeated falls: Secondary | ICD-10-CM | POA: Insufficient documentation

## 2023-07-20 DIAGNOSIS — M797 Fibromyalgia: Secondary | ICD-10-CM | POA: Insufficient documentation

## 2023-07-20 NOTE — Progress Notes (Signed)
 Referring Provider Iona Hansen, NP [address] [place],  [place] [address]   CC:  Chief Complaint  Patient presents with   Advice Only   Skin Problem      [name] is an 58 y.o. female.  HPI: [name] is a very pleasant 58 year old female who presents today after undergoing gastric bypass in 2009 with excess skin on the inner thighs.  She is interested in removal of the skin to improve the appearance  Allergies  Allergen Reactions   Bee Venom Anaphylaxis and Rash    Other reaction(s): PRURITUS, RASH   Bioflavonoid Products Anaphylaxis and Itching    walnuts   Cocamide Anaphylaxis   Coconut (Cocos Nucifera) Anaphylaxis and Itching   Coconut Fatty Acid Anaphylaxis   Coconut Oil Anaphylaxis and Itching   Food Anaphylaxis and Itching    Walnuts - anaphylaxis, itching   Mixed Ragweed Anaphylaxis, Itching and Rash   Molds & Smuts Anaphylaxis, Itching and Shortness Of Breath    Other Reaction(s): Other (See Comments)   Mushroom Ext Cmplx(Shiitake-Reishi-Mait) Anaphylaxis   Other Anaphylaxis, Hives and Itching    Ragweed  Dog and Cat Dander  Ragweed    Other reaction(s): Wheezing (ALLERGY/intolerance) Dog and Cat Dander Ragweed   Shellfish Allergy Anaphylaxis and Swelling   Strawberry Extract Anaphylaxis   Ambrosia Artemisiifolia (Ragweed) Skin Test     Other Reaction(s): Other (See Comments)   Oxycodone Itching    Other Reaction(s): Other (See Comments)   Hydrocodone-Acetaminophen Rash and Itching    Other Reaction(s): Other (See Comments)   Latex Rash    Outpatient Encounter Medications as of 05/16/2023  Medication Sig Note   acetaminophen (TYLENOL) 650 MG CR tablet Take 1,300 mg by mouth as needed for pain.    albuterol (PROVENTIL HFA) 108 (90 Base) MCG/ACT inhaler Inhale 2 puffs into the lungs every 4 (four) hours as needed for wheezing or shortness of breath.    azelastine (ASTELIN) 0.1 % nasal spray Place 2 sprays into both nostrils daily  as needed for allergies. Use in each nostril as directed    BIOTIN PO Take 1 capsule by mouth daily.    botulinum toxin Type A (BOTOX) 200 units injection Provider to inject 155 units into the muscles of the head and neck every 12 weeks. Discard remainder.    budesonide-formoterol (SYMBICORT) 80-4.5 MCG/ACT inhaler Inhale 2 puffs into the lungs as directed.    buPROPion (WELLBUTRIN XL) 150 MG 24 hr tablet Take 3 tablets by mouth daily.    calcium carbonate (TUMS - DOSED IN MG ELEMENTAL CALCIUM) 500 MG chewable tablet Chew 1,000 mg by mouth as needed for indigestion or heartburn.    cetirizine (ZYRTEC) 10 MG tablet Take 10 mg by mouth daily.    Cholecalciferol (VITAMIN D-1000 MAX ST) 25 MCG (1000 UT) tablet Take 1 tablet by mouth 2 (two) times daily.    citalopram (CELEXA) 20 MG tablet Take 20 mg by mouth daily.    CREON 36000-114000 units CPEP capsule Take 36,000 Units by mouth as directed.    Cyanocobalamin (VITAMIN B-12 PO) Take 1 capsule by mouth daily.    cyclobenzaprine (FLEXERIL) 10 MG tablet Take 10 mg by mouth 3 (three) times daily as needed for muscle spasms.    diclofenac Sodium (VOLTAREN) 1 % GEL Apply 4 g topically 4 (four) times daily. (Patient taking differently: Apply 1 Application topically as needed (pain).)    diphenhydrAMINE (SOMINEX) 25 MG tablet Take 25  mg by mouth as needed for itching, allergies or sleep.    divalproex (DEPAKOTE ER) 500 MG 24 hr tablet Take 2 tablets (1,000 mg total) by mouth at bedtime AND 1 tablet (500 mg total) daily.    EPINEPHrine (EPIPEN 2-PAK) 0.3 mg/0.3 mL IJ SOAJ injection Inject 0.3 mg into the muscle as needed for anaphylaxis.    Ergocalciferol (VITAMIN D2 PO) Take by mouth.    fluticasone (FLONASE) 50 MCG/ACT nasal spray Place 1 spray into both nostrils as needed (allergies/runny nose).    Galcanezumab-gnlm (EMGALITY) 120 MG/ML SOAJ INJECT 120 MG INTO THE SKIN EVERY 30 DAYS    hydrochlorothiazide (HYDRODIURIL) 12.5 MG tablet Take 12.5 mg by mouth  daily.    hydrOXYzine (ATARAX) 25 MG tablet Take 25 mg by mouth 3 (three) times daily.    levofloxacin (LEVAQUIN) 500 MG tablet Take 500 mg by mouth daily.    lidocaine (LIDODERM) 5 % Place 1 patch onto the skin daily as needed (pain).    meclizine (ANTIVERT) 25 MG tablet Take 25 mg by mouth 3 (three) times daily.    Melatonin 10 MG CHEW Chew 30 mg by mouth at bedtime as needed (sleep).    montelukast (SINGULAIR) 10 MG tablet Take 10 mg by mouth at bedtime as needed (allergies).    Multiple Vitamin (MULTIVITAMIN) tablet Take 1 tablet by mouth in the morning.    naloxegol oxalate (MOVANTIK) 25 MG TABS tablet Take 1 tablet by mouth every morning.    NUCYNTA 75 MG tablet Take 75 mg by mouth 2 (two) times daily as needed for moderate pain or severe pain.    olopatadine (PATANOL) 0.1 % ophthalmic solution Place 1 drop into both eyes daily at 6 (six) AM.    ondansetron (ZOFRAN-ODT) 4 MG disintegrating tablet Take 4 mg by mouth every 8 (eight) hours as needed for nausea or vomiting.    orphenadrine (NORFLEX) 100 MG tablet Take 100 mg by mouth 2 (two) times daily as needed for muscle spasms or mild pain (pain score 1-3).    pantoprazole (PROTONIX) 20 MG tablet Take 1 tablet (20 mg total) by mouth daily.    prazosin (MINIPRESS) 1 MG capsule Take 1 mg by mouth at bedtime.    QUEtiapine (SEROQUEL) 50 MG tablet Take 50 mg by mouth 2 (two) times daily.    raNITIdine HCl (RANITIDINE 150 MAX STRENGTH PO) Take 1 tablet by mouth daily.    Rimegepant Sulfate (NURTEC) 75 MG TBDP Take 1 tablet (75 mg total) by mouth daily. (Patient taking differently: Take 75 mg by mouth as needed (migraines).)    tapentadol HCl (NUCYNTA) 75 MG tablet Take 75 mg by mouth 3 (three) times daily.    tirzepatide (MOUNJARO) 7.5 MG/0.5ML Pen Inject 7.5 mg into the skin once a week.    topiramate (TOPAMAX) 50 MG tablet Take 50 mg by mouth 2 (two) times daily.    triamcinolone cream (KENALOG) 0.1 % Apply 1 Application topically 2 (two)  times daily.    trolamine salicylate (ASPERCREME) 10 % cream Apply 1 Application topically as needed for muscle pain.    apixaban (ELIQUIS) 5 MG TABS tablet Take 2 tablets (10 mg total) by mouth 2 (two) times daily for 4 days, THEN 1 tablet (5 mg total) 2 (two) times daily for 21 days. (Patient taking differently: Take 5 mg by mouth once daily ) 10/02/2022: Pt is adamant she is only taking this medication once daily instead of BID as prescribed.    apixaban (ELIQUIS)  5 MG TABS tablet Take 5 mg by mouth 2 (two) times daily.    No facility-administered encounter medications on file as of 05/16/2023.     Past Medical History:  Diagnosis Date   Acute kidney injury (HCC) 10/25/2020   Acute respiratory disease due to COVID-19 virus 10/25/2020   AKI (acute kidney injury) (HCC) 10/25/2020   Allergic rhinitis    Allergic to cats    pet dander   Allergy    Anemia    Anxiety    Arthritis 09/2018   both feet   Asthma    Back pain    Bipolar disorder (HCC)    Cervicalgia    Chronic fatigue syndrome    Chronic low back pain with left-sided sciatica    Class 3 severe obesity with serious comorbidity and body mass index (BMI) of 40.0 to 44.9 in adult [facility]) 11/14/2014   Constipation    Depression    Diabetes mellitus without complication (HCC)    "pre"   Dyspnea    Environmental allergies    Fibromyalgia    GERD (gastroesophageal reflux disease)    Grave's disease    Heart murmur    High cholesterol    History of blood clots    Hypertension    Joint pain    Lactose intolerance    Lower extremity edema    Multiple food allergies    OSA (obstructive sleep apnea)    Osteoporosis    Palpitations    Panic disorder    PE (pulmonary thromboembolism) (HCC)    on Eliquis   Pharyngoesophageal dysphagia 06/24/2014   Prediabetes 05/11/2019   Protein-calorie malnutrition, mild (HCC) 06/23/2021   Pulmonary embolism (HCC) 2024   Rheumatoid arthritis (HCC)    Risk for falls    Sciatica     Severe 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 (HCC)    Sleep apnea    no cpap worn   Subclinical hypothyroidism 05/11/2019   Syncope    Syncope and collapse    Thyroid disease    Vasovagal syncope    Vertigo    Vitamin D deficiency     Past Surgical History:  Procedure Laterality Date   ABDOMINAL HYSTERECTOMY  2006   BREAST BIOPSY Left 05/30/2023   MM LT BREAST BX W LOC DEV 1ST LESION IMAGE BX SPEC STEREO GUIDE 05/30/2023 [facility]   CHOLECYSTECTOMY N/A 10/04/2022   Procedure: LAPAROSCOPIC CHOLECYSTECTOMY WITH INTRAOPERATIVE CHOLANGIOGRAM;  Surgeon: Manus Rudd, MD;  Location: [facility] OR;  Service: General;  Laterality: N/A;   COLONOSCOPY     GASTRIC BYPASS  2008    Family History  Problem Relation Age of Onset   Hypertension Mother    Cirrhosis Mother    Alcohol abuse Mother    Depression Mother    Physical abuse Mother    Hyperlipidemia Mother    Thyroid disease Mother    Anxiety disorder Mother    Arthritis Mother    Hypertension Father    Alcohol abuse Father    Hyperlipidemia Father    Depression Father    Drug abuse Father    Arthritis Father    COPD Father    Gout Father    Hypertension Sister    Alcohol abuse Sister    Drug abuse Sister    Depression Sister    Anxiety disorder Sister    OCD Sister    Thyroid disease Sister    Depression Sister    Thyroid disease Sister  Stroke Maternal Grandmother    Heart attack Maternal Grandmother    Diabetes Maternal Uncle    Stroke Maternal Uncle    Colon cancer Maternal Uncle    Seizures Cousin    Breast cancer Cousin    Breast cancer Cousin    ADD / ADHD Other    Diabetes Other    Hypertension Other    Dementia Neg Hx    Esophageal cancer Neg Hx    Rectal cancer Neg Hx    Stomach cancer Neg Hx     Social History   Social History Narrative   Son lives with her. Caffeine use: 1 cup/day of soda     Review of Systems General: Denies fevers, chills, weight loss CV: Denies  chest pain, shortness of breath, palpitations Skin: Excess skin on the inner thighs  Physical Exam    05/16/2023   10:25 AM 04/21/2023    8:00 PM 04/21/2023    7:34 PM  Vitals with BMI  Height 5\' 6"     Weight 172 lbs    BMI 27.77    Systolic 118 139   Diastolic 80 84   Pulse 98  90    General:  No acute distress,  Alert and oriented, Non-Toxic, Normal speech and affect Skin: As noted above excess skin after bariatric surgery.  The skin is primarily on the medial aspect of her thighs Mammogram: Not applicable Assessment/Plan Excess skin, medial thighs: Unfortunately I did not catch the reason for the patient's referral and time to stop the appointment.  I have told her that I no longer perform medial thigh lifts due to the exceedingly high risk of complications postoperatively.  She understands and will seek care from another plastic surgeon.  Santiago Glad 07/20/2023, 1:34 PM
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
## 2023-07-22 ENCOUNTER — Ambulatory Visit: Payer: 59 | Admitting: Physical Therapy

## 2023-07-26 ENCOUNTER — Telehealth: Payer: Self-pay | Admitting: Neurology

## 2023-07-26 NOTE — Telephone Encounter (Signed)
 Botox auth#: [id number] (07/26/23-10/26/23), will fill through [facility].

## 2023-07-27 ENCOUNTER — Ambulatory Visit (HOSPITAL_BASED_OUTPATIENT_CLINIC_OR_DEPARTMENT_OTHER): Payer: 59 | Attending: Family Medicine | Admitting: Physical Therapy

## 2023-07-27 DIAGNOSIS — M797 Fibromyalgia: Secondary | ICD-10-CM | POA: Insufficient documentation

## 2023-07-27 DIAGNOSIS — M6281 Muscle weakness (generalized): Secondary | ICD-10-CM | POA: Diagnosis present

## 2023-07-27 DIAGNOSIS — R262 Difficulty in walking, not elsewhere classified: Secondary | ICD-10-CM | POA: Insufficient documentation

## 2023-07-27 DIAGNOSIS — R296 Repeated falls: Secondary | ICD-10-CM | POA: Diagnosis present
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
## 2023-07-29 ENCOUNTER — Ambulatory Visit (HOSPITAL_BASED_OUTPATIENT_CLINIC_OR_DEPARTMENT_OTHER): Payer: 59 | Admitting: Physical Therapy

## 2023-08-01 ENCOUNTER — Ambulatory Visit (HOSPITAL_BASED_OUTPATIENT_CLINIC_OR_DEPARTMENT_OTHER): Payer: 59 | Admitting: Physical Therapy

## 2023-08-01 ENCOUNTER — Encounter (HOSPITAL_BASED_OUTPATIENT_CLINIC_OR_DEPARTMENT_OTHER): Payer: Self-pay

## 2023-08-04 ENCOUNTER — Encounter (HOSPITAL_BASED_OUTPATIENT_CLINIC_OR_DEPARTMENT_OTHER): Payer: Self-pay | Admitting: Physical Therapy

## 2023-08-04 ENCOUNTER — Ambulatory Visit (HOSPITAL_BASED_OUTPATIENT_CLINIC_OR_DEPARTMENT_OTHER): Payer: 59 | Admitting: Physical Therapy

## 2023-08-04 DIAGNOSIS — R296 Repeated falls: Secondary | ICD-10-CM

## 2023-08-04 DIAGNOSIS — R262 Difficulty in walking, not elsewhere classified: Secondary | ICD-10-CM

## 2023-08-04 DIAGNOSIS — M6281 Muscle weakness (generalized): Secondary | ICD-10-CM
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
## 2023-08-09 ENCOUNTER — Ambulatory Visit (HOSPITAL_BASED_OUTPATIENT_CLINIC_OR_DEPARTMENT_OTHER): Payer: 59 | Admitting: Physical Therapy

## 2023-08-09 ENCOUNTER — Encounter (HOSPITAL_BASED_OUTPATIENT_CLINIC_OR_DEPARTMENT_OTHER): Payer: Self-pay

## 2023-08-10 ENCOUNTER — Ambulatory Visit (HOSPITAL_BASED_OUTPATIENT_CLINIC_OR_DEPARTMENT_OTHER): Admitting: Physical Therapy

## 2023-08-10 ENCOUNTER — Encounter (HOSPITAL_BASED_OUTPATIENT_CLINIC_OR_DEPARTMENT_OTHER): Payer: Self-pay | Admitting: Physical Therapy

## 2023-08-10 DIAGNOSIS — M6281 Muscle weakness (generalized): Secondary | ICD-10-CM

## 2023-08-10 DIAGNOSIS — R262 Difficulty in walking, not elsewhere classified: Secondary | ICD-10-CM

## 2023-08-10 DIAGNOSIS — R296 Repeated falls: Secondary | ICD-10-CM

## 2023-08-10 DIAGNOSIS — M797 Fibromyalgia: Secondary | ICD-10-CM
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
## 2023-08-11 ENCOUNTER — Ambulatory Visit: Payer: 59 | Attending: Family Medicine

## 2023-08-11 DIAGNOSIS — R252 Cramp and spasm: Secondary | ICD-10-CM | POA: Insufficient documentation

## 2023-08-11 DIAGNOSIS — R262 Difficulty in walking, not elsewhere classified: Secondary | ICD-10-CM | POA: Insufficient documentation

## 2023-08-11 DIAGNOSIS — R293 Abnormal posture: Secondary | ICD-10-CM | POA: Diagnosis present

## 2023-08-11 DIAGNOSIS — M797 Fibromyalgia: Secondary | ICD-10-CM | POA: Insufficient documentation

## 2023-08-11 DIAGNOSIS — R296 Repeated falls: Secondary | ICD-10-CM | POA: Diagnosis present

## 2023-08-11 DIAGNOSIS — M6281 Muscle weakness (generalized): Secondary | ICD-10-CM | POA: Insufficient documentation
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
## 2023-08-18 ENCOUNTER — Ambulatory Visit: Payer: 59 | Admitting: Adult Health

## 2023-08-24 ENCOUNTER — Encounter (HOSPITAL_BASED_OUTPATIENT_CLINIC_OR_DEPARTMENT_OTHER): Attending: Family Medicine | Admitting: Physical Therapy

## 2023-08-24 ENCOUNTER — Encounter (HOSPITAL_BASED_OUTPATIENT_CLINIC_OR_DEPARTMENT_OTHER): Payer: Self-pay | Admitting: Physical Therapy

## 2023-08-24 DIAGNOSIS — M6281 Muscle weakness (generalized): Secondary | ICD-10-CM | POA: Insufficient documentation

## 2023-08-24 DIAGNOSIS — R262 Difficulty in walking, not elsewhere classified: Secondary | ICD-10-CM | POA: Diagnosis present

## 2023-08-24 DIAGNOSIS — R296 Repeated falls: Secondary | ICD-10-CM | POA: Diagnosis present
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
## 2023-08-25 ENCOUNTER — Ambulatory Visit: Payer: 59 | Admitting: Neurology

## 2023-08-25 DIAGNOSIS — G43711 Chronic migraine without aura, intractable, with status migrainosus: Secondary | ICD-10-CM

## 2023-08-25 DIAGNOSIS — F419 Anxiety disorder, unspecified: Secondary | ICD-10-CM

## 2023-08-25 MED ORDER — ONABOTULINUMTOXINA 200 UNITS IJ SOLR
155.0000 [IU] | Freq: Once | INTRAMUSCULAR | Status: AC
  Administered 2023-08-25: 155 [IU] via INTRAMUSCULAR

## 2023-08-25 MED ORDER — ALPRAZOLAM 0.5 MG PO TABS: ORAL_TABLET | ORAL | 1 refills | Status: DC

## 2023-08-25 MED ORDER — CITALOPRAM HYDROBROMIDE 20 MG PO TABS: 20.0000 mg | ORAL_TABLET | Freq: Every day | ORAL | 0 refills | Status: DC

## 2023-08-25 NOTE — Progress Notes (Signed)
 Botox- 200 units x 1 vial Lot: [id number] Expiration: 01/2025 NDC: 2956-2130-86  Bacteriostatic 0.9% Sodium Chloride- * mL  Lot: [id number] Expiration: 03/24/2024 NDC: 6295-2841-32  Dx: G40.102 S/P  Witnessed by Hermenia Fiscal, RN

## 2023-08-25 NOTE — Progress Notes (Signed)
 08/25/23 ALL: She presents for Botox. She reports migraines are stable. She continues to have 4-8 migrainous headaches. She questions if Botox is working for her. She does state they are less intense. She continues topiramate and Emgality. Nurtec works well for abortive therapy. She takes Xanax prior to procedure. Her son brought her to her appt. She needs to stay with me for botox given anxiety. She ran out of her celexa I can refill this time but have her pcp or Dr. Orvan July) who she sees refill going forward  EXTREME ANXIETY WITH BOTOX. DO NOT MAKE HER WAIT. Givem xanax to take prior to injections, son to drive(do not drive on xanax.   Meds ordered this encounter  Medications   botulinum toxin Type A (BOTOX) injection 155 Units    Botox- 200 units x 1 vial Lot: [id number] Expiration: 01/2025 NDC: 2956-2130-86  Bacteriostatic 0.9% Sodium Chloride- * mL  Lot: [id number] Expiration: 03/24/2024 NDC: 6295-2841-32  Dx: G40.102 S/P   ALPRAZolam (XANAX) 0.5 MG tablet    Sig: Take 1-2 tabs 30 minutes prior to botox for anxiety. Have someone drive you.    Dispense:  30 tablet    Refill:  1   citalopram (CELEXA) 20 MG tablet    Sig: Take 1 tablet (20 mg total) by mouth daily. See psychiatry for further refills.    Dispense:  90 tablet    Refill:  0      Consent Form Botulism Toxin Injection For Chronic Migraine    Reviewed orally with patient, additionally signature is on file:  Botulism toxin has been approved by the Federal drug administration for treatment of chronic migraine. Botulism toxin does not cure chronic migraine and it may not be effective in some patients.  The administration of botulism toxin is accomplished by injecting a small amount of toxin into the muscles of the neck and head. Dosage must be titrated for each individual. Any benefits resulting from botulism toxin tend to wear off after 3 months with a repeat injection required if benefit is to be  maintained. Injections are usually done every 3-4 months with maximum effect peak achieved by about 2 or 3 weeks. Botulism toxin is expensive and you should be sure of what costs you will incur resulting from the injection.  The side effects of botulism toxin use for chronic migraine may include:   -Transient, and usually mild, facial weakness with facial injections  -Transient, and usually mild, head or neck weakness with head/neck injections  -Reduction or loss of forehead facial animation due to forehead muscle weakness  -Eyelid drooping  -Dry eye  -Pain at the site of injection or bruising at the site of injection  -Double vision  -Potential unknown long term risks   Contraindications: You should not have Botox if you are pregnant, nursing, allergic to albumin, have an infection, skin condition, or muscle weakness at the site of the injection, or have myasthenia gravis, Lambert-Eaton syndrome, or ALS.  It is also possible that as with any injection, there may be an allergic reaction or no effect from the medication. Reduced effectiveness after repeated injections is sometimes seen and rarely infection at the injection site may occur. All care will be taken to prevent these side effects. If therapy is given over a long time, atrophy and wasting in the muscle injected may occur. Occasionally the patient's become refractory to treatment because they develop antibodies to the toxin. In this event, therapy needs to be modified.  I have  read the above information and consent to the administration of botulism toxin.    BOTOX PROCEDURE NOTE FOR MIGRAINE HEADACHE  Contraindications and precautions discussed with patient(above). Aseptic procedure was observed and patient tolerated procedure. Procedure performed by Shawnie Dapper, FNP-C.   The condition has existed for more than 6 months, and pt does not have a diagnosis of ALS, Myasthenia Gravis or Lambert-Eaton Syndrome.  Risks and benefits of  injections discussed and pt agrees to proceed with the procedure.  Written consent obtained  These injections are medically necessary. Pt  receives good benefits from these injections. These injections do not cause sedations or hallucinations which the oral therapies may cause.   Description of procedure:  The patient was placed in a sitting position. The standard protocol was used for Botox as follows, with 5 units of Botox injected at each site:  -Procerus muscle, midline injection  -Corrugator muscle, bilateral injection  -Frontalis muscle, bilateral injection, with 2 sites each side, medial injection was performed in the upper one third of the frontalis muscle, in the region vertical from the medial inferior edge of the superior orbital rim. The lateral injection was again in the upper one third of the forehead vertically above the lateral limbus of the cornea, 1.5 cm lateral to the medial injection site.  -Temporalis muscle injection, 4 sites, bilaterally. The first injection was 3 cm above the tragus of the ear, second injection site was 1.5 cm to 3 cm up from the first injection site in line with the tragus of the ear. The third injection site was 1.5-3 cm forward between the first 2 injection sites. The fourth injection site was 1.5 cm posterior to the second injection site. 5th site laterally in the temporalis  muscleat the level of the outer canthus.  -Occipitalis muscle injection, 3 sites, bilaterally. The first injection was done one half way between the occipital protuberance and the tip of the mastoid process behind the ear. The second injection site was done lateral and superior to the first, 1 fingerbreadth from the first injection. The third injection site was 1 fingerbreadth superiorly and medially from the first injection site.  -Cervical paraspinal muscle injection, 2 sites, bilaterally. The first injection site was 1 cm from the midline of the cervical spine, 3 cm inferior to  the lower border of the occipital protuberance. The second injection site was 1.5 cm superiorly and laterally to the first injection site.  -Trapezius muscle injection was performed at 3 sites, bilaterally. The first injection site was in the upper trapezius muscle halfway between the inflection point of the neck, and the acromion. The second injection site was one half way between the acromion and the first injection site. The third injection was done between the first injection site and the inflection point of the neck.   Will return for repeat injection in 3 months.   A total of 200 units of Botox was prepared, 155 units of Botox was injected as documented above, 45 Botox not injected was wasted. The patient tolerated the procedure well, there were no complications of the above procedure.

## 2023-08-29 ENCOUNTER — Encounter (HOSPITAL_BASED_OUTPATIENT_CLINIC_OR_DEPARTMENT_OTHER): Payer: Self-pay

## 2023-08-29 ENCOUNTER — Ambulatory Visit (HOSPITAL_BASED_OUTPATIENT_CLINIC_OR_DEPARTMENT_OTHER): Admitting: Physical Therapy

## 2023-09-05 ENCOUNTER — Ambulatory Visit (HOSPITAL_BASED_OUTPATIENT_CLINIC_OR_DEPARTMENT_OTHER): Attending: Family Medicine | Admitting: Physical Therapy

## 2023-09-05 ENCOUNTER — Encounter (HOSPITAL_BASED_OUTPATIENT_CLINIC_OR_DEPARTMENT_OTHER): Payer: Self-pay | Admitting: Physical Therapy

## 2023-09-05 DIAGNOSIS — M6281 Muscle weakness (generalized): Secondary | ICD-10-CM | POA: Insufficient documentation

## 2023-09-05 DIAGNOSIS — M797 Fibromyalgia: Secondary | ICD-10-CM | POA: Diagnosis present

## 2023-09-05 DIAGNOSIS — R296 Repeated falls: Secondary | ICD-10-CM | POA: Insufficient documentation

## 2023-09-05 DIAGNOSIS — R262 Difficulty in walking, not elsewhere classified: Secondary | ICD-10-CM | POA: Insufficient documentation
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
## 2023-09-07 ENCOUNTER — Encounter (HOSPITAL_BASED_OUTPATIENT_CLINIC_OR_DEPARTMENT_OTHER): Payer: Self-pay

## 2023-09-07 ENCOUNTER — Ambulatory Visit (HOSPITAL_BASED_OUTPATIENT_CLINIC_OR_DEPARTMENT_OTHER): Admitting: Physical Therapy

## 2023-09-12 ENCOUNTER — Ambulatory Visit (HOSPITAL_BASED_OUTPATIENT_CLINIC_OR_DEPARTMENT_OTHER): Admitting: Physical Therapy

## 2023-09-14 ENCOUNTER — Ambulatory Visit (HOSPITAL_BASED_OUTPATIENT_CLINIC_OR_DEPARTMENT_OTHER): Admitting: Physical Therapy

## 2023-09-19 ENCOUNTER — Ambulatory Visit (HOSPITAL_BASED_OUTPATIENT_CLINIC_OR_DEPARTMENT_OTHER): Admitting: Physical Therapy

## 2023-09-22 ENCOUNTER — Ambulatory Visit

## 2023-10-13 ENCOUNTER — Other Ambulatory Visit: Payer: Self-pay | Admitting: Adult Health

## 2023-10-13 DIAGNOSIS — G43711 Chronic migraine without aura, intractable, with status migrainosus: Secondary | ICD-10-CM

## 2023-10-16 ENCOUNTER — Other Ambulatory Visit: Payer: Self-pay

## 2023-10-16 ENCOUNTER — Encounter (HOSPITAL_COMMUNITY): Payer: Self-pay | Admitting: Emergency Medicine

## 2023-10-16 ENCOUNTER — Emergency Department (HOSPITAL_COMMUNITY)
Admission: EM | Admit: 2023-10-16 | Discharge: 2023-10-16 | Disposition: A | Discharge status: Home / Self Care | Attending: Emergency Medicine | Admitting: Emergency Medicine

## 2023-10-16 ENCOUNTER — Emergency Department (HOSPITAL_COMMUNITY)

## 2023-10-16 DIAGNOSIS — S0990XA Unspecified injury of head, initial encounter: Secondary | ICD-10-CM | POA: Insufficient documentation

## 2023-10-16 DIAGNOSIS — M25519 Pain in unspecified shoulder: Secondary | ICD-10-CM | POA: Diagnosis not present

## 2023-10-16 DIAGNOSIS — W01198A Fall on same level from slipping, tripping and stumbling with subsequent striking against other object, initial encounter: Secondary | ICD-10-CM | POA: Insufficient documentation

## 2023-10-16 NOTE — ED Triage Notes (Signed)
 Pt presents to the ED ambulatory with a cane via POV with complaints of a fall 3 days ago hitting her head and endorses some pain to her eye that came into contact with the door frame. She notes having some increased pain which prompted her to come the ED- no meds taken PTA. Hx of recurrent falls and works with PT due to fibromyalgia. A&Ox4 at this time. Denies LOC, not on thinners, CP or SOB.

## 2023-10-16 NOTE — Discharge Instructions (Addendum)
 Please follow-up with your doctor.  If you're still having symptoms in 1 week, you may need to follow-up with a headache specialist.

## 2023-10-16 NOTE — ED Provider Notes (Signed)
 [facility] Emergency Department Provider Note MRN:  [id number]  Arrival date & time: 10/16/23     Chief Complaint   Fall   History of Present Illness   [name] is a 58 y.o. year-old female presents to the ED with chief complaint of fall and head injury.  She states that she fell on Thursday (3 days ago) and hit her head.  She states that she might have passed out.  She's uncertain how long she was on the floor.  She states that she has had some increased pain above her left eye and headache.  She reports recurrent falls due to fibromyalgia.  She is not anticoagulated.  She states that she had some shoulder pain after the fall as well, but that feels better now..  History provided by patient.   Review of Systems  Pertinent positive and negative review of systems noted in HPI.    Physical Exam   Vitals:   10/16/23 0508  BP: 103/66  Pulse: 93  Resp: 18  Temp: 97.8 F (36.6 C)  SpO2: 93%    CONSTITUTIONAL:  non toxic-appearing, NAD NEURO:  Alert and oriented x 3, CN 3-12 grossly intact EYES:  eyes equal and reactive ENT/NECK:  Supple, no stridor  CARDIO:  normal rate, regular rhythm, appears well-perfused  PULM:  No respiratory distress, CTAB GI/GU:  non-distended,  MSK/SPINE:  No gross deformities, no edema, moves all extremities  SKIN:  no rash, atraumatic, no laceration or large contusions   *Additional and/or pertinent findings included in MDM below  Diagnostic and Interventional Summary    EKG Interpretation Date/Time:    Ventricular Rate:    PR Interval:    QRS Duration:    QT Interval:    QTC Calculation:   R Axis:      Text Interpretation:         Labs Reviewed - No data to display  CT HEAD WO CONTRAST ( )  Final Result      Medications - No data to display   Procedures  /  Critical Care Procedures  ED Course and Medical Decision Making  I have reviewed the triage vital signs, the nursing notes, and  pertinent available records from the EMR.  Social Determinants Affecting Complexity of Care: Patient has no clinically significant social determinants affecting this chief complaint..   ED Course: Clinical Course as of 10/16/23 0622  Sun Oct 16, 2023  0600 CT HEAD WO CONTRAST ( ) No obvious skull fracture or ICH seen. [RB]    Clinical Course User Index [RB] Sherel Dikes, PA-C    Medical Decision Making Patient here with headache after a fall 3 days ago.  No obvious traumatic injuries on my exam.  She looks well.  She states that she has recurrent falls and sees PT due to fibro.  She is not anticoagulated.  She denies CP or SOB.    Patient appears non-toxic.    CT head is negative.  Symptoms might be post-concussive syndrome.  We discussed concussion precautions.  She is instructed to follow up with PCP and or neurology.  Amount and/or Complexity of Data Reviewed Radiology: ordered and independent interpretation performed. Decision-making details documented in ED Course.         Consultants: No consultations were needed in caring for this patient.   Treatment and Plan: I considered admission due to patient's initial presentation, but after considering the examination and diagnostic results, patient will not require admission and can be  discharged with outpatient follow-up.    Final Clinical Impressions(s) / ED Diagnoses     ICD-10-CM   1. Injury of head, initial encounter  S09.90XA       ED Discharge Orders     None         Discharge Instructions Discussed with and Provided to Patient:     Discharge Instructions      Please follow-up with your doctor.  If you're still having symptoms in 1 week, you may need to follow-up with a headache specialist.     Sherel Dikes, PA-C 10/16/23 Arbie Beal, April, MD 10/16/23 [phone]

## 2023-10-19 ENCOUNTER — Telehealth: Payer: Self-pay | Admitting: Neurology

## 2023-10-19 ENCOUNTER — Encounter: Payer: Self-pay | Admitting: Neurology

## 2023-10-19 NOTE — Telephone Encounter (Signed)
 Received approval, pt will continue to fill through [facility]. Auth#: [id number] (10/19/23-01/19/24)

## 2023-10-19 NOTE — Telephone Encounter (Signed)
 Submitted Botox  PA renewal via CMM, status is pending. Key: [id number]

## 2023-10-19 NOTE — Telephone Encounter (Signed)
 Representative called to give us  Botox  delivery date   BOTOX  Delivery : Delivery date :10-20-2023 1 Vile 200 units Quantity of 1

## 2023-10-19 NOTE — Telephone Encounter (Signed)
 error

## 2023-10-25 ENCOUNTER — Encounter (HOSPITAL_COMMUNITY): Payer: Self-pay

## 2023-10-25 ENCOUNTER — Emergency Department (HOSPITAL_COMMUNITY)
Admission: EM | Admit: 2023-10-25 | Discharge: 2023-10-25 | Discharge status: Against Medical Advice | Attending: Emergency Medicine | Admitting: Emergency Medicine

## 2023-10-25 ENCOUNTER — Other Ambulatory Visit: Payer: Self-pay

## 2023-10-25 DIAGNOSIS — R509 Fever, unspecified: Secondary | ICD-10-CM | POA: Diagnosis not present

## 2023-10-25 DIAGNOSIS — R519 Headache, unspecified: Secondary | ICD-10-CM | POA: Diagnosis present

## 2023-10-25 DIAGNOSIS — R109 Unspecified abdominal pain: Secondary | ICD-10-CM | POA: Diagnosis not present

## 2023-10-25 DIAGNOSIS — Z5321 Procedure and treatment not carried out due to patient leaving prior to being seen by health care provider: Secondary | ICD-10-CM | POA: Insufficient documentation

## 2023-10-25 DIAGNOSIS — R112 Nausea with vomiting, unspecified: Secondary | ICD-10-CM | POA: Insufficient documentation

## 2023-10-25 DIAGNOSIS — R0981 Nasal congestion: Secondary | ICD-10-CM | POA: Diagnosis not present

## 2023-10-25 LAB — URINALYSIS, ROUTINE W REFLEX MICROSCOPIC
Bilirubin Urine: NEGATIVE
Glucose, UA: NEGATIVE mg/dL
Hgb urine dipstick: NEGATIVE
Ketones, ur: 5 mg/dL — AB
Nitrite: NEGATIVE
Protein, ur: NEGATIVE mg/dL
Specific Gravity, Urine: 1.019 (ref 1.005–1.030)
pH: 5 (ref 5.0–8.0)

## 2023-10-25 LAB — CBC
HCT: 37.6 % (ref 36.0–46.0)
Hemoglobin: 12.3 g/dL (ref 12.0–15.0)
MCH: 29.3 pg (ref 26.0–34.0)
MCHC: 32.7 g/dL (ref 30.0–36.0)
MCV: 89.5 fL (ref 80.0–100.0)
Platelets: 145 10*3/uL — ABNORMAL LOW (ref 150–400)
RBC: 4.2 MIL/uL (ref 3.87–5.11)
RDW: 13.4 % (ref 11.5–15.5)
WBC: 4.2 10*3/uL (ref 4.0–10.5)
nRBC: 0 % (ref 0.0–0.2)

## 2023-10-25 LAB — COMPREHENSIVE METABOLIC PANEL WITH GFR
ALT: 12 U/L (ref 0–44)
AST: 19 U/L (ref 15–41)
Albumin: 3.7 g/dL (ref 3.5–5.0)
Alkaline Phosphatase: 41 U/L (ref 38–126)
Anion gap: 10 (ref 5–15)
BUN: 13 mg/dL (ref 6–20)
CO2: 24 mmol/L (ref 22–32)
Calcium: 9.1 mg/dL (ref 8.9–10.3)
Chloride: 105 mmol/L (ref 98–111)
Creatinine, Ser: 1.05 mg/dL — ABNORMAL HIGH (ref 0.44–1.00)
GFR, Estimated: >60 mL/min (ref >60)
Glucose, Bld: 100 mg/dL — ABNORMAL HIGH (ref 70–99)
Potassium: 3.4 mmol/L — ABNORMAL LOW (ref 3.5–5.1)
Sodium: 139 mmol/L (ref 135–145)
Total Bilirubin: 0.4 mg/dL (ref 0.0–1.2)
Total Protein: 7.1 g/dL (ref 6.5–8.1)

## 2023-10-25 LAB — RESP PANEL BY RT-PCR (RSV, FLU A&B, COVID)  RVPGX2
Influenza A by PCR: NEGATIVE
Influenza B by PCR: NEGATIVE
Resp Syncytial Virus by PCR: NEGATIVE
SARS Coronavirus 2 by RT PCR: NEGATIVE

## 2023-10-25 LAB — LIPASE, BLOOD: Lipase: 28 U/L (ref 11–51)

## 2023-10-25 NOTE — ED Notes (Signed)
 Family at bedside.

## 2023-10-25 NOTE — ED Triage Notes (Signed)
 Pt arrived from home via POV c/o head ache, congestion, fever, n/v x 3 days. Pt thinks she may have Covid

## 2023-10-25 NOTE — ED Notes (Signed)
 Pt advised sort staff she does not want to stay any longer. OTF

## 2023-10-28 ENCOUNTER — Emergency Department (HOSPITAL_COMMUNITY)
Admission: EM | Admit: 2023-10-28 | Discharge: 2023-10-29 | Disposition: A | Discharge status: Home / Self Care | Source: Home / Self Care | Attending: Emergency Medicine | Admitting: Emergency Medicine

## 2023-10-28 ENCOUNTER — Emergency Department (HOSPITAL_COMMUNITY)

## 2023-10-28 ENCOUNTER — Emergency Department (HOSPITAL_COMMUNITY)
Admission: EM | Admit: 2023-10-28 | Discharge: 2023-10-28 | Disposition: A | Discharge status: Home / Self Care | Attending: Emergency Medicine | Admitting: Emergency Medicine

## 2023-10-28 ENCOUNTER — Encounter (HOSPITAL_COMMUNITY): Payer: Self-pay

## 2023-10-28 ENCOUNTER — Other Ambulatory Visit: Payer: Self-pay

## 2023-10-28 DIAGNOSIS — I10 Essential (primary) hypertension: Secondary | ICD-10-CM | POA: Diagnosis not present

## 2023-10-28 DIAGNOSIS — S161XXA Strain of muscle, fascia and tendon at neck level, initial encounter: Secondary | ICD-10-CM | POA: Diagnosis not present

## 2023-10-28 DIAGNOSIS — M79601 Pain in right arm: Secondary | ICD-10-CM | POA: Insufficient documentation

## 2023-10-28 DIAGNOSIS — Z7951 Long term (current) use of inhaled steroids: Secondary | ICD-10-CM | POA: Diagnosis not present

## 2023-10-28 DIAGNOSIS — Z91040 Latex allergy status: Secondary | ICD-10-CM | POA: Insufficient documentation

## 2023-10-28 DIAGNOSIS — T148XXA Other injury of unspecified body region, initial encounter: Secondary | ICD-10-CM

## 2023-10-28 DIAGNOSIS — Z79899 Other long term (current) drug therapy: Secondary | ICD-10-CM | POA: Diagnosis not present

## 2023-10-28 DIAGNOSIS — M25561 Pain in right knee: Secondary | ICD-10-CM | POA: Insufficient documentation

## 2023-10-28 DIAGNOSIS — W19XXXA Unspecified fall, initial encounter: Secondary | ICD-10-CM | POA: Insufficient documentation

## 2023-10-28 DIAGNOSIS — Z7984 Long term (current) use of oral hypoglycemic drugs: Secondary | ICD-10-CM | POA: Insufficient documentation

## 2023-10-28 DIAGNOSIS — M542 Cervicalgia: Secondary | ICD-10-CM | POA: Diagnosis present

## 2023-10-28 DIAGNOSIS — Z8616 Personal history of COVID-19: Secondary | ICD-10-CM | POA: Insufficient documentation

## 2023-10-28 DIAGNOSIS — Z86711 Personal history of pulmonary embolism: Secondary | ICD-10-CM | POA: Insufficient documentation

## 2023-10-28 DIAGNOSIS — E119 Type 2 diabetes mellitus without complications: Secondary | ICD-10-CM | POA: Insufficient documentation

## 2023-10-28 DIAGNOSIS — E039 Hypothyroidism, unspecified: Secondary | ICD-10-CM | POA: Insufficient documentation

## 2023-10-28 DIAGNOSIS — J45909 Unspecified asthma, uncomplicated: Secondary | ICD-10-CM | POA: Diagnosis not present

## 2023-10-28 DIAGNOSIS — M546 Pain in thoracic spine: Secondary | ICD-10-CM | POA: Insufficient documentation

## 2023-10-28 DIAGNOSIS — Y92000 Kitchen of unspecified non-institutional (private) residence as  the place of occurrence of the external cause: Secondary | ICD-10-CM | POA: Insufficient documentation

## 2023-10-28 DIAGNOSIS — S0990XA Unspecified injury of head, initial encounter: Secondary | ICD-10-CM | POA: Diagnosis present

## 2023-10-28 DIAGNOSIS — Z7901 Long term (current) use of anticoagulants: Secondary | ICD-10-CM | POA: Diagnosis not present

## 2023-10-28 LAB — COMPREHENSIVE METABOLIC PANEL WITH GFR
ALT: 13 U/L (ref 0–44)
AST: 22 U/L (ref 15–41)
Albumin: 4.3 g/dL (ref 3.5–5.0)
Alkaline Phosphatase: 52 U/L (ref 38–126)
Anion gap: 12 (ref 5–15)
BUN: 16 mg/dL (ref 6–20)
CO2: 24 mmol/L (ref 22–32)
Calcium: 9.3 mg/dL (ref 8.9–10.3)
Chloride: 102 mmol/L (ref 98–111)
Creatinine, Ser: 0.88 mg/dL (ref 0.44–1.00)
GFR, Estimated: >60 mL/min (ref >60)
Glucose, Bld: 91 mg/dL (ref 70–99)
Potassium: 3 mmol/L — ABNORMAL LOW (ref 3.5–5.1)
Sodium: 138 mmol/L (ref 135–145)
Total Bilirubin: 0.4 mg/dL (ref 0.0–1.2)
Total Protein: 7.8 g/dL (ref 6.5–8.1)

## 2023-10-28 LAB — CBC WITH DIFFERENTIAL/PLATELET
Abs Immature Granulocytes: 0.02 10*3/uL (ref 0.00–0.07)
Basophils Absolute: 0 10*3/uL (ref 0.0–0.1)
Basophils Relative: 1 %
Eosinophils Absolute: 0.1 10*3/uL (ref 0.0–0.5)
Eosinophils Relative: 2 %
HCT: 38 % (ref 36.0–46.0)
Hemoglobin: 12.4 g/dL (ref 12.0–15.0)
Immature Granulocytes: 0 %
Lymphocytes Relative: 31 %
Lymphs Abs: 1.8 10*3/uL (ref 0.7–4.0)
MCH: 29.3 pg (ref 26.0–34.0)
MCHC: 32.6 g/dL (ref 30.0–36.0)
MCV: 89.8 fL (ref 80.0–100.0)
Monocytes Absolute: 0.8 10*3/uL (ref 0.1–1.0)
Monocytes Relative: 14 %
Neutro Abs: 3 10*3/uL (ref 1.7–7.7)
Neutrophils Relative %: 52 %
Platelets: 197 10*3/uL (ref 150–400)
RBC: 4.23 MIL/uL (ref 3.87–5.11)
RDW: 13.8 % (ref 11.5–15.5)
WBC: 5.7 10*3/uL (ref 4.0–10.5)
nRBC: 0 % (ref 0.0–0.2)

## 2023-10-28 MED ORDER — ACETAMINOPHEN 500 MG PO TABS
1000.0000 mg | ORAL_TABLET | Freq: Once | ORAL | Status: AC
  Administered 2023-10-28: 1000 mg via ORAL
  Filled 2023-10-28: qty 2

## 2023-10-28 NOTE — ED Provider Notes (Signed)
 [facility] DEPT [facility] Emergency Department Provider Note MRN:  [id number]  Arrival date & time: 10/28/23     Chief Complaint   Fall History of Present Illness   [name] is a 58 y.o. year-old female with a history of bipolar disorder, PE, diabetes presenting to the ED with chief complaint of fall.  Patient was in the kitchen and thinks that she lost her balance and fell, from the fall endorsing right knee pain, right arm pain.  Unsure if she hit her head.  Review of Systems  A thorough review of systems was obtained and all systems are negative except as noted in the HPI and PMH.   Patient's Health History    Past Medical History:  Diagnosis Date   Acute kidney injury (HCC) 10/25/2020   Acute respiratory disease due to COVID-19 virus 10/25/2020   AKI (acute kidney injury) (HCC) 10/25/2020   Allergic rhinitis    Allergic to cats    pet dander   Allergy    Anemia    Anxiety    Arthritis 09/2018   both feet   Asthma    Back pain    Bipolar disorder (HCC)    Cervicalgia    Chronic fatigue syndrome    Chronic low back pain with left-sided sciatica    Class 3 severe obesity with serious comorbidity and body mass index (BMI) of 40.0 to 44.9 in adult 11/14/2014   Constipation    Depression    Diabetes mellitus without complication (HCC)    "pre"   Dyspnea    Environmental allergies    Fibromyalgia    GERD (gastroesophageal reflux disease)    Grave's disease    Heart murmur    High cholesterol    History of blood clots    Hypertension    Joint pain    Lactose intolerance    Lower extremity edema    Multiple food allergies    OSA (obstructive sleep apnea)    Osteoporosis    Palpitations    Panic disorder    PE (pulmonary thromboembolism) (HCC)    on Eliquis    Pharyngoesophageal dysphagia 06/24/2014   Prediabetes 05/11/2019   Protein-calorie malnutrition, mild (HCC) 06/23/2021   Pulmonary embolism (HCC) 2024   Rheumatoid arthritis (HCC)     Risk for falls    Sciatica    Severe 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 (HCC)    Sleep apnea    no cpap worn   Subclinical hypothyroidism 05/11/2019   Syncope    Syncope and collapse    Thyroid  disease    Vasovagal syncope    Vertigo    Vitamin D  deficiency     Past Surgical History:  Procedure Laterality Date   ABDOMINAL HYSTERECTOMY  2006   BREAST BIOPSY Left 05/30/2023   MM LT BREAST BX W LOC DEV 1ST LESION IMAGE BX SPEC STEREO GUIDE 05/30/2023 [facility]   CHOLECYSTECTOMY N/A 10/04/2022   Procedure: LAPAROSCOPIC CHOLECYSTECTOMY WITH INTRAOPERATIVE CHOLANGIOGRAM;  Surgeon: Dareen Ebbing, MD;  Location: [facility] OR;  Service: General;  Laterality: N/A;   COLONOSCOPY     GASTRIC BYPASS  2008    Family History  Problem Relation Age of Onset   Hypertension Mother    Cirrhosis Mother    Alcohol abuse Mother    Depression Mother    Physical abuse Mother    Hyperlipidemia Mother    Thyroid  disease Mother    Anxiety disorder Mother    Arthritis Mother  Hypertension Father    Alcohol abuse Father    Hyperlipidemia Father    Depression Father    Drug abuse Father    Arthritis Father    COPD Father    Gout Father    Hypertension Sister    Alcohol abuse Sister    Drug abuse Sister    Depression Sister    Anxiety disorder Sister    OCD Sister    Thyroid  disease Sister    Depression Sister    Thyroid  disease Sister    Stroke Maternal Grandmother    Heart attack Maternal Grandmother    Diabetes Maternal Uncle    Stroke Maternal Uncle    Colon cancer Maternal Uncle    Seizures Cousin    Breast cancer Cousin    Breast cancer Cousin    ADD / ADHD Other    Diabetes Other    Hypertension Other    Dementia Neg Hx    Esophageal cancer Neg Hx    Rectal cancer Neg Hx    Stomach cancer Neg Hx     Social History   Socioeconomic History   Marital status: Divorced    Spouse name: Not on file   Number of children: 2   Years of education: 14    Highest education level: Not on file  Occupational History   Occupation: Disabled  Tobacco Use   Smoking status: Never   Smokeless tobacco: Never  Vaping Use   Vaping status: Never Used  Substance and Sexual Activity   Alcohol use: Not Currently    Comment: 1-2 drinks/month   Drug use: No   Sexual activity: Not on file  Other Topics Concern   Not on file  Social History Narrative   Son lives with her. Caffeine use: 1 cup/day of soda   Social Drivers of Corporate investment banker Strain: Not on file  Food Insecurity: High Risk (10/19/2022)   [facility]   Hunger Vital Sign    Worried About Running Out of Food in the Last Year: Often true    Ran Out of Food in the Last Year: Often true  Transportation Needs: Unmet Transportation Needs (10/19/2022)   [facility]   Transportation    In the past 12 months, has lack of reliable transportation kept you from medical appointments, meetings, work or from getting things needed for daily living? : Yes  Physical Activity: Not on file  Stress: Not on file  Social Connections: Not on file  Intimate Partner Violence: Not At Risk (10/02/2022)   Humiliation, Afraid, Rape, and Kick questionnaire    Fear of Current or Ex-Partner: No    Emotionally Abused: No    Physically Abused: No    Sexually Abused: No     Physical Exam   Vitals:   10/28/23 0025  BP: 124/73  Pulse: (!) 101  Resp: 20  Temp: 97.7 F (36.5 C)  SpO2: 99%    CONSTITUTIONAL: Well-appearing, NAD NEURO/PSYCH: A bit somnolent but wakes easily and answers questions correctly but a bit slowly, moves all extremities equally. EYES:  eyes equal and reactive ENT/NECK:  no LAD, no JVD CARDIO: Regular rate, well-perfused, normal S1 and S2 PULM:  CTAB no wheezing or rhonchi GI/GU:  non-distended, non-tender MSK/SPINE: Left knee with normal range of motion, right knee with a lot of pain elicited with range of motion,  tender to the entire right arm. SKIN:  no rash   *Additional  and/or pertinent findings included in MDM below  Diagnostic and Interventional Summary    EKG Interpretation Date/Time:    Ventricular Rate:    PR Interval:    QRS Duration:    QT Interval:    QTC Calculation:   R Axis:      Text Interpretation:         Labs Reviewed - No data to display  CT HEAD WO CONTRAST ( )  Final Result    CT CERVICAL SPINE WO CONTRAST  Final Result    DG Knee Complete 4 Views Right  Final Result    DG Forearm Right  Final Result    DG Humerus Right  Final Result      Medications  acetaminophen  (TYLENOL ) tablet 1,000 mg (1,000 mg Oral Given 10/28/23 0145)     Procedures  /  Critical Care Procedures  ED Course and Medical Decision Making  Initial Impression and Ddx Mechanical fall with right-sided orthopedic complaints awaiting x-ray.  Acting a bit somnolent, may be due to her nighttime medications but another consideration would be intracranial injury or bleeding.  Lungs are clear, not having any chest pain or shortness of breath, abdomen soft and nontender, no spinal tenderness, doubt significant intrathoracic, intra-abdominal, or spinal injury.  Past medical/surgical history that increases complexity of ED encounter: Bipolar disorder, PE, diabetes  Interpretation of Diagnostics I personally reviewed the x-rays and CT imaging and my interpretation is as follows: No acute fracture, no intracranial bleeding    Patient Reassessment and Ultimate Disposition/Management     Patient sleeping comfortably on reassessment, wakes easily, appropriate for discharge, no indication for further testing or admission.  Patient management required discussion with the following services or consulting groups:  None  Complexity of Problems Addressed Acute illness or injury that poses threat of life of bodily function  Additional Data Reviewed and Analyzed Further history obtained  from: None  Additional Factors Impacting ED Encounter Risk Consideration of hospitalization  Merrick Abe. Harless Lien, MD [facility] Emergency Medicine [facility] [email or web address]  Final Clinical Impressions(s) / ED Diagnoses     ICD-10-CM   1. Fall, initial encounter  [email or web address]       ED Discharge Orders     None        Discharge Instructions Discussed with and Provided to Patient:     Discharge Instructions      You were evaluated in the Emergency Department and after careful evaluation, we did not find any emergent condition requiring admission or further testing in the hospital.  Your exam/testing today is overall reassuring.  CT scans and x-rays did not show any significant injuries.  Suspect your pain is related to bruising or sprain from the fall.  Can use the sling as needed for comfort, recommend taking it off a few times a day to range the shoulder so that your shoulder does not freeze up.  Use Tylenol  and Motrin  for pain.  Please return to the Emergency Department if you experience any worsening of your condition.   Thank you for allowing us  to be a part of your care.     Edson Graces, MD 10/28/23 [phone]

## 2023-10-28 NOTE — ED Notes (Signed)
This RN applied R shoulder sling.

## 2023-10-28 NOTE — ED Triage Notes (Signed)
 Pt. Bib gcems for a fall. Not on blood thinners, but did hit her head. Was in the floor for about 35 mins. Pt. Normally walks with a cane. States that she tripped.C/o Right sided arm and knee pain.

## 2023-10-28 NOTE — ED Triage Notes (Signed)
 Pt. Arrives for generalized pain. Pt. Had a fall yesterday and was seen. States that she is hurting in her neck, back, and both legs.

## 2023-10-28 NOTE — Discharge Instructions (Signed)
 You were evaluated in the Emergency Department and after careful evaluation, we did not find any emergent condition requiring admission or further testing in the hospital.  Your exam/testing today is overall reassuring.  CT scans and x-rays did not show any significant injuries.  Suspect your pain is related to bruising or sprain from the fall.  Can use the sling as needed for comfort, recommend taking it off a few times a day to range the shoulder so that your shoulder does not freeze up.  Use Tylenol  and Motrin  for pain.  Please return to the Emergency Department if you experience any worsening of your condition.   Thank you for allowing us  to be a part of your care.

## 2023-10-29 MED ORDER — ACETAMINOPHEN 500 MG PO TABS
1000.0000 mg | ORAL_TABLET | Freq: Once | ORAL | Status: AC
  Administered 2023-10-29: 1000 mg via ORAL
  Filled 2023-10-29: qty 2

## 2023-10-29 MED ORDER — METHOCARBAMOL 500 MG PO TABS
500.0000 mg | ORAL_TABLET | Freq: Once | ORAL | Status: AC
  Administered 2023-10-29: 500 mg via ORAL
  Filled 2023-10-29: qty 1

## 2023-10-29 MED ORDER — METHOCARBAMOL 500 MG PO TABS: 500.0000 mg | ORAL_TABLET | Freq: Three times a day (TID) | ORAL | 0 refills | Status: DC | PRN

## 2023-10-29 NOTE — ED Provider Notes (Signed)
 [place] EMERGENCY DEPARTMENT AT [facility] Provider Note  CSN: [id number] Arrival date & time: 10/28/23 1802  Chief Complaint(s) Pain  HPI [name] is a 58 y.o. female with a past medical history listed below who presents to the emergency department with persistent upper back and shoulder pain following a fall yesterday.  She had imaging of her head, neck shoulder, arm and knee which were all negative for any acute injuries.  Patient reports that she feels stiff in is painful when she moves her neck.  Pain feels better with being still and applying warm compress.  HPI  Past Medical History Past Medical History:  Diagnosis Date   Acute kidney injury (HCC) 10/25/2020   Acute respiratory disease due to COVID-19 virus 10/25/2020   AKI (acute kidney injury) (HCC) 10/25/2020   Allergic rhinitis    Allergic to cats    pet dander   Allergy    Anemia    Anxiety    Arthritis 09/2018   both feet   Asthma    Back pain    Bipolar disorder (HCC)    Cervicalgia    Chronic fatigue syndrome    Chronic low back pain with left-sided sciatica    Class 3 severe obesity with serious comorbidity and body mass index (BMI) of 40.0 to 44.9 in adult 11/14/2014   Constipation    Depression    Diabetes mellitus without complication (HCC)    "pre"   Dyspnea    Environmental allergies    Fibromyalgia    GERD (gastroesophageal reflux disease)    Grave's disease    Heart murmur    High cholesterol    History of blood clots    Hypertension    Joint pain    Lactose intolerance    Lower extremity edema    Multiple food allergies    OSA (obstructive sleep apnea)    Osteoporosis    Palpitations    Panic disorder    PE (pulmonary thromboembolism) (HCC)    on Eliquis    Pharyngoesophageal dysphagia 06/24/2014   Prediabetes 05/11/2019   Protein-calorie malnutrition, mild (HCC) 06/23/2021   Pulmonary embolism (HCC) 2024   Rheumatoid arthritis (HCC)    Risk for falls     Sciatica    Severe 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 (HCC)    Sleep apnea    no cpap worn   Subclinical hypothyroidism 05/11/2019   Syncope    Syncope and collapse    Thyroid  disease    Vasovagal syncope    Vertigo    Vitamin D  deficiency    Patient Active Problem List   Diagnosis Date Noted   Cholecystitis, acute with cholelithiasis 10/03/2022   Choledocholithiasis 10/02/2022   Acute left ankle pain 06/02/2022   Pulmonary embolism (HCC) 06/01/2022   Acute pain of left foot 05/27/2022   Type 2 diabetes mellitus without complications (HCC) 05/26/2022   Anemia 05/26/2022   Urinary retention 05/26/2022   Syncope and collapse 05/25/2022   Lumbosacral radiculopathy at L5 01/28/2022   Lumbosacral radiculopathy at S1 01/28/2022   Multiple neurological symptoms 06/23/2021   Metabolic syndrome 10/21/2020   Constipation 07/09/2020   OSA (obstructive sleep apnea) 05/11/2019   Intractable chronic migraine without aura and with status migrainosus 01/14/2016   Risk for falls 09/02/2015   Insomnia 05/27/2015   Bipolar disorder (HCC) 02/25/2015   Severe 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 (HCC) 10/10/2014   GAD (generalized anxiety disorder) 10/10/2014   Panic disorder  with agoraphobia 10/10/2014   Social anxiety disorder 10/10/2014   Asthma 08/01/2014   Low back pain 07/24/2014   Encounter for monitoring opioid maintenance therapy 07/02/2014   S/P gastric bypass 06/24/2014   Hx of recurrent syncope 05/24/2013   Drug overdose, multiple drugs 11/01/2012   Migraine 05/22/2012   Depression 11/22/2011   Chronic pain 11/22/2011   Graves' disease    GERD (gastroesophageal reflux disease)    Need for prophylactic postmenopausal hormone replacement therapy 11/03/2011   Allergic rhinitis 09/28/2011   Essential hypertension 09/28/2011   Home Medication(s) Prior to Admission medications   Medication Sig Start Date End Date Taking?  Authorizing Provider  methocarbamol  (ROBAXIN ) 500 MG tablet Take 1-2 tablets (500-1,000 mg total) by mouth every 8 (eight) hours as needed for muscle spasms. 10/29/23  Yes Crestina Strike, Camila Cecil, MD  acetaminophen  (TYLENOL ) 650 MG CR tablet Take 1,300 mg by mouth as needed for pain.    [provider]  albuterol  (PROVENTIL  HFA) 108 (90 Base) MCG/ACT inhaler Inhale 2 puffs into the lungs every 4 (four) hours as needed for wheezing or shortness of breath.    [provider]  ALPRAZolam  (XANAX ) 0.5 MG tablet Take 1-2 tabs 30 minutes prior to botox  for anxiety. Have someone drive you. 08/25/23   Glory Larsen, MD  azelastine  (ASTELIN ) 0.1 % nasal spray Place 2 sprays into both nostrils daily as needed for allergies. Use in each nostril as directed    [provider]  BIOTIN  PO Take 1 capsule by mouth daily.    [provider]  botulinum toxin Type A  (BOTOX ) 200 units injection INJECT 155 UNITS INTO THE  MUSCLES OF THE HEAD AND NECK  EVERY 12 WEEKS (DISCARD UNUSED  AFTER FIRST USE) 10/13/23   Glory Larsen, MD  budesonide-formoterol  [facility]) 80-4.5 MCG/ACT inhaler Inhale 2 puffs into the lungs as directed.    [provider]  calcium  carbonate (TUMS - DOSED IN MG ELEMENTAL CALCIUM ) 500 MG chewable tablet Chew 1,000 mg by mouth as needed for indigestion or heartburn.    [provider]  cetirizine (ZYRTEC) 10 MG tablet Take 10 mg by mouth daily. 10/20/21   [provider]  citalopram  (CELEXA ) 20 MG tablet Take 1 tablet (20 mg total) by mouth daily. See psychiatry for further refills. 08/25/23   Glory Larsen, MD  CREON 36000-114000 units CPEP capsule Take 36,000 Units by mouth as directed. 01/20/23   [provider]  Cyanocobalamin  (VITAMIN B-12 PO) Take 1 capsule by mouth daily.    [provider]  diclofenac  Sodium (VOLTAREN ) 1 % GEL Apply 4 g topically 4 (four) times daily. Patient taking differently: Apply 1 Application  topically as needed (pain). 06/04/22   Glenn Lange, DO  diphenhydrAMINE  (SOMINEX) 25 MG tablet Take 25 mg by mouth as needed for itching, allergies or sleep.    [provider]  divalproex  (DEPAKOTE  ER) 500 MG 24 hr tablet Take 2 tablets (1,000 mg total) by mouth at bedtime AND 1 tablet (500 mg total) daily. 11/11/22   Fulton Job, MD  EPINEPHrine  (EPIPEN  2-PAK) 0.3 mg/0.3 mL IJ SOAJ injection Inject 0.3 mg into the muscle as needed for anaphylaxis.    [provider]  Ergocalciferol  (VITAMIN D2 PO) Take by mouth.    [provider]  fluticasone  (FLONASE ) 50 MCG/ACT nasal spray Place 1 spray into both nostrils as needed (allergies/runny nose). 06/04/22   Glenn Lange, DO  Galcanezumab -gnlm (EMGALITY ) 120 MG/ML SOAJ INJECT 120 MG  INTO THE SKIN EVERY 30 DAYS 04/26/23   Glory Larsen, MD  hydrochlorothiazide  (HYDRODIURIL ) 12.5 MG tablet Take 12.5 mg by mouth daily.    [provider]  hydrOXYzine  (ATARAX ) 25 MG tablet Take 25 mg by mouth 3 (three) times daily.    [provider]  levofloxacin  (LEVAQUIN ) 500 MG tablet Take 500 mg by mouth daily.    [provider]  lidocaine  (LIDODERM ) 5 % Place 1 patch onto the skin daily as needed (pain).    [provider]  meclizine  (ANTIVERT ) 25 MG tablet Take 25 mg by mouth 3 (three) times daily.    [provider]  Melatonin 10 MG CHEW Chew 30 mg by mouth at bedtime as needed (sleep).    [provider]  montelukast  (SINGULAIR ) 10 MG tablet Take 10 mg by mouth at bedtime as needed (allergies). 08/07/19   [provider]  Multiple Vitamin (MULTIVITAMIN) tablet Take 1 tablet by mouth in the morning.    [provider]  olopatadine  (PATANOL) 0.1 % ophthalmic solution Place 1 drop into both eyes daily at 6 (six) AM.    [provider]  ondansetron  (ZOFRAN -ODT) 4 MG disintegrating tablet Take 4 mg by mouth every 8 (eight) hours as needed for nausea or  vomiting.    [provider]  pantoprazole  (PROTONIX ) 20 MG tablet Take 1 tablet (20 mg total) by mouth daily. 04/21/23   Rolinda Climes, DO  prazosin  (MINIPRESS ) 1 MG capsule Take 1 mg by mouth at bedtime. 02/11/23   [provider]  QUEtiapine  (SEROQUEL ) 50 MG tablet Take 50 mg by mouth 2 (two) times daily.    [provider]  raNITIdine HCl (RANITIDINE 150 MAX STRENGTH PO) Take 1 tablet by mouth daily.    [provider]  Rimegepant Sulfate  (NURTEC) 75 MG TBDP Take 1 tablet (75 mg total) by mouth daily. Patient taking differently: Take 75 mg by mouth as needed (migraines). 08/05/22   Glory Larsen, MD  tapentadol  HCl (NUCYNTA ) 75 MG tablet Take 75 mg by mouth 3 (three) times daily. 10/13/22   [provider]  tirzepatide Florence Hunt) 7.5 MG/0.5ML Pen Inject 7.5 mg into the skin once a week.    [provider]  topiramate  (TOPAMAX ) 50 MG tablet Take 50 mg by mouth 2 (two) times daily.    [provider]  triamcinolone cream (KENALOG) 0.1 % Apply 1 Application topically 2 (two) times daily.    [provider]  trolamine salicylate (ASPERCREME) 10 % cream Apply 1 Application topically as needed for muscle pain.    [provider]                                                                                                                                    Allergies Bee venom, Bioflavonoid products, Cocamide, Coconut (cocos nucifera), Coconut fatty acid, Coconut oil, Food, Mixed ragweed, Molds & smuts,  Mushroom ext cmplx(shiitake-reishi-mait), Other, Shellfish allergy, Strawberry extract, Ambrosia artemisiifolia (ragweed) skin test, Oxycodone , Hydrocodone -acetaminophen , and Latex  Review of Systems Review of Systems As noted in HPI  Physical Exam Vital Signs  I have reviewed the triage vital signs BP 115/74 (BP Location: Left Arm)   Pulse 92   Temp 98.2 F (36.8 C) (Oral)   Resp 17   Ht 5\' 6"  (1.676 m)   Wt  74.8 kg   SpO2 100%   BMI 26.63 kg/m   Physical Exam Vitals reviewed.  Constitutional:      General: She is not in acute distress.    Appearance: She is well-developed. She is not diaphoretic.  HENT:     Head: Normocephalic and atraumatic.     Nose: Nose normal.  Eyes:     General: No scleral icterus.       Right eye: No discharge.        Left eye: No discharge.     Conjunctiva/sclera: Conjunctivae normal.     Pupils: Pupils are equal, round, and reactive to light.  Cardiovascular:     Rate and Rhythm: Normal rate and regular rhythm.     Heart sounds: No murmur heard.    No friction rub. No gallop.  Pulmonary:     Effort: Pulmonary effort is normal. No respiratory distress.     Breath sounds: Normal breath sounds. No stridor. No rales.  Abdominal:     General: There is no distension.     Palpations: Abdomen is soft.     Tenderness: There is no abdominal tenderness.  Musculoskeletal:     Cervical back: Normal range of motion and neck supple. Tenderness present. Muscular tenderness present. No spinous process tenderness.       Back:  Skin:    General: Skin is warm and dry.     Findings: No erythema or rash.  Neurological:     Mental Status: She is alert and oriented to person, place, and time.     ED Results and Treatments Labs (all labs ordered are listed, but only abnormal results are displayed) Labs Reviewed  COMPREHENSIVE METABOLIC PANEL WITH GFR - Abnormal; Notable for the following components:      Result Value   Potassium 3.0 (*)    All other components within normal limits  CBC WITH DIFFERENTIAL/PLATELET                                                                                                                         EKG  EKG Interpretation Date/Time:    Ventricular Rate:    PR Interval:    QRS Duration:    QT Interval:    QTC Calculation:   R Axis:      Text Interpretation:         Radiology No results found.   Medications Ordered in  ED Medications  acetaminophen  (TYLENOL ) tablet 1,000 mg (1,000 mg Oral Given 10/29/23 0017)  methocarbamol  (ROBAXIN ) tablet 500 mg (500  mg Oral Given 10/29/23 0018)   Procedures Procedures  (including critical care time) Medical Decision Making / ED Course   Medical Decision Making Amount and/or Complexity of Data Reviewed Labs: ordered. Decision-making details documented in ED Course.  Risk OTC drugs. Prescription drug management.    Patient presents for persistent pain following mechanical fall yesterday.  Workup was reassuring then.  No need to repeat.  Favoring muscular strain versus fibromyalgia flare.  Labs are otherwise reassuring.  Provided with above medication.  Will prescribe muscle relaxers.  Recommended additional supportive management.    Final Clinical Impression(s) / ED Diagnoses Final diagnoses:  Muscle strain   The patient appears reasonably screened and/or stabilized for discharge and I doubt any other medical condition or other [facility] requiring further screening, evaluation, or treatment in the ED at this time. I have discussed the findings, Dx and Tx plan with the patient/family who expressed understanding and agree(s) with the plan. Discharge instructions discussed at length. The patient/family was given strict return precautions who verbalized understanding of the instructions. No further questions at time of discharge.  Disposition: Discharge  Condition: Good  ED Discharge Orders          Ordered    methocarbamol  (ROBAXIN ) 500 MG tablet  Every 8 hours PRN        10/29/23 0013            Follow Up: Angelia Kelp, NP [address] [place] [address] [phone]  Call  to schedule an appointment for close follow up     This chart was dictated using voice recognition software.  Despite best efforts to proofread,  errors can occur which can change the documentation meaning.    Lindle Rhea, MD 10/29/23 [phone]

## 2023-10-29 NOTE — Discharge Instructions (Signed)
 You may use over-the-counter Ibuprofen (Motrin. Advil) or Naproxen (Aleve) not both. Additionally you can take Acetaminophen (Tylenol), topical muscle creams such as SalonPas, [employer]ogul, Bengay, etc. Please stretch, apply ice or heat (whichever helps), and have massage therapy for additional assistance.  For pain control you may take 1000 mg of acetaminophen (Tylenol) every 8 hours with or without either 600 mg of Ibuprofen (Motrin, Advil, etc.) every 6-8 hours or 220 mg of Naproxen (Aleve) every 12 hours as needed.  Please limit 24 hr usage of the following medicines: - Acetaminophen (Tylenol) to 4000 mg - Ibuprofen (Motrin, Advil, etc.) to 2400 mg - Naproxen (Aleve) to 880 mg    Please note that other over-the-counter medicine may contain acetaminophen or ibuprofen as a component of their ingredients.

## 2023-10-29 NOTE — ED Notes (Signed)
 Called son at the patients request. Patient wants to wait in the lobby for him. Son states he is on the way.

## 2023-11-15 ENCOUNTER — Ambulatory Visit

## 2023-11-16 ENCOUNTER — Ambulatory Visit: Admitting: Neurology

## 2023-11-20 ENCOUNTER — Other Ambulatory Visit: Payer: Self-pay | Admitting: Neurology

## 2023-11-20 DIAGNOSIS — F419 Anxiety disorder, unspecified: Secondary | ICD-10-CM

## 2023-11-23 ENCOUNTER — Telehealth: Payer: Self-pay

## 2023-11-23 NOTE — Telephone Encounter (Signed)
 Pt called is stating that she received a call from our office. Let pt know that call was to remind her about her upcoming appointment on 11/28/2023 @ 3:30pm.

## 2023-11-23 NOTE — Telephone Encounter (Signed)
 Patient requesting refill on celexa  per Dr Ines last office note Pt  ran out of her celexa  I can refill this time but have her pcp or Dr. Lauree) who she sees refill going forward. Please make patient aware

## 2023-11-24 ENCOUNTER — Other Ambulatory Visit: Payer: Self-pay

## 2023-11-24 ENCOUNTER — Encounter (HOSPITAL_COMMUNITY): Payer: Self-pay | Admitting: Emergency Medicine

## 2023-11-24 ENCOUNTER — Emergency Department (HOSPITAL_COMMUNITY)
Admission: EM | Admit: 2023-11-24 | Discharge: 2023-11-24 | Disposition: A | Discharge status: Home / Self Care | Attending: Emergency Medicine | Admitting: Emergency Medicine

## 2023-11-24 DIAGNOSIS — G43901 Migraine, unspecified, not intractable, with status migrainosus: Secondary | ICD-10-CM | POA: Insufficient documentation

## 2023-11-24 DIAGNOSIS — Z91040 Latex allergy status: Secondary | ICD-10-CM | POA: Insufficient documentation

## 2023-11-24 DIAGNOSIS — I10 Essential (primary) hypertension: Secondary | ICD-10-CM | POA: Diagnosis not present

## 2023-11-24 DIAGNOSIS — E119 Type 2 diabetes mellitus without complications: Secondary | ICD-10-CM | POA: Insufficient documentation

## 2023-11-24 DIAGNOSIS — R519 Headache, unspecified: Secondary | ICD-10-CM | POA: Diagnosis present

## 2023-11-24 DIAGNOSIS — Z79899 Other long term (current) drug therapy: Secondary | ICD-10-CM | POA: Diagnosis not present

## 2023-11-24 DIAGNOSIS — J45909 Unspecified asthma, uncomplicated: Secondary | ICD-10-CM | POA: Insufficient documentation

## 2023-11-24 LAB — COMPREHENSIVE METABOLIC PANEL WITH GFR
ALT: 15 U/L (ref 0–44)
AST: 20 U/L (ref 15–41)
Albumin: 3.6 g/dL (ref 3.5–5.0)
Alkaline Phosphatase: 47 U/L (ref 38–126)
Anion gap: 12 (ref 5–15)
BUN: 17 mg/dL (ref 6–20)
CO2: 23 mmol/L (ref 22–32)
Calcium: 8.6 mg/dL — ABNORMAL LOW (ref 8.9–10.3)
Chloride: 107 mmol/L (ref 98–111)
Creatinine, Ser: 0.67 mg/dL (ref 0.44–1.00)
GFR, Estimated: >60 mL/min (ref >60)
Glucose, Bld: 95 mg/dL (ref 70–99)
Potassium: 3.1 mmol/L — ABNORMAL LOW (ref 3.5–5.1)
Sodium: 142 mmol/L (ref 135–145)
Total Bilirubin: 0.4 mg/dL (ref 0.0–1.2)
Total Protein: 7.1 g/dL (ref 6.5–8.1)

## 2023-11-24 LAB — CBC WITH DIFFERENTIAL/PLATELET
Abs Immature Granulocytes: 0.02 10*3/uL (ref 0.00–0.07)
Basophils Absolute: 0 10*3/uL (ref 0.0–0.1)
Basophils Relative: 0 %
Eosinophils Absolute: 0.2 10*3/uL (ref 0.0–0.5)
Eosinophils Relative: 3 %
HCT: 35.7 % — ABNORMAL LOW (ref 36.0–46.0)
Hemoglobin: 11.3 g/dL — ABNORMAL LOW (ref 12.0–15.0)
Immature Granulocytes: 0 %
Lymphocytes Relative: 31 %
Lymphs Abs: 1.8 10*3/uL (ref 0.7–4.0)
MCH: 29.2 pg (ref 26.0–34.0)
MCHC: 31.7 g/dL (ref 30.0–36.0)
MCV: 92.2 fL (ref 80.0–100.0)
Monocytes Absolute: 0.8 10*3/uL (ref 0.1–1.0)
Monocytes Relative: 14 %
Neutro Abs: 3 10*3/uL (ref 1.7–7.7)
Neutrophils Relative %: 52 %
Platelets: 217 10*3/uL (ref 150–400)
RBC: 3.87 MIL/uL (ref 3.87–5.11)
RDW: 16.2 % — ABNORMAL HIGH (ref 11.5–15.5)
WBC: 5.8 10*3/uL (ref 4.0–10.5)
nRBC: 0 % (ref 0.0–0.2)

## 2023-11-24 LAB — VALPROIC ACID LEVEL: Valproic Acid Lvl: <10 ug/mL — ABNORMAL LOW (ref 50–100)

## 2023-11-24 LAB — ACETAMINOPHEN LEVEL: Acetaminophen (Tylenol), Serum: 12 ug/mL (ref 10–30)

## 2023-11-24 LAB — AMMONIA: Ammonia: 20 umol/L (ref 9–35)

## 2023-11-24 MED ORDER — PROCHLORPERAZINE EDISYLATE 10 MG/2ML IJ SOLN
10.0000 mg | Freq: Once | INTRAMUSCULAR | Status: AC
  Administered 2023-11-24: 10 mg via INTRAVENOUS
  Filled 2023-11-24: qty 2

## 2023-11-24 MED ORDER — KETOROLAC TROMETHAMINE 30 MG/ML IJ SOLN
15.0000 mg | Freq: Once | INTRAMUSCULAR | Status: AC
Start: 2023-11-24 — End: 2023-11-24
  Administered 2023-11-24: 15 mg via INTRAVENOUS
  Filled 2023-11-24: qty 1

## 2023-11-24 NOTE — ED Provider Notes (Signed)
 [place] EMERGENCY DEPARTMENT AT [facility] Provider Note   CSN: [id number] Arrival date & time: 11/24/23  1438     Patient presents with: Migraine   [name] is a 58 y.o. female.    Migraine  Patient presents with 6 days of headache.  History of chronic migraines.  States the headaches are usually in front of her head but this morning the back of her head.  At times will have tingling in her left hand.  States she has been taking Tylenol  for it.  States she took 70 tablets of Tylenol  over the last 4 days.  States that she had been taking Motrin  before that. Patient states her PCP told her that her potassium was low and gave her some pills to supplement that. At times states her right abdomen will also hurt.    Past Medical History:  Diagnosis Date   Acute kidney injury (HCC) 10/25/2020   Acute respiratory disease due to COVID-19 virus 10/25/2020   AKI (acute kidney injury) (HCC) 10/25/2020   Allergic rhinitis    Allergic to cats    pet dander   Allergy    Anemia    Anxiety    Arthritis 09/2018   both feet   Asthma    Back pain    Bipolar disorder (HCC)    Cervicalgia    Chronic fatigue syndrome    Chronic low back pain with left-sided sciatica    Class 3 severe obesity with serious comorbidity and body mass index (BMI) of 40.0 to 44.9 in adult 11/14/2014   Constipation    Depression    Diabetes mellitus without complication (HCC)    pre   Dyspnea    Environmental allergies    Fibromyalgia    GERD (gastroesophageal reflux disease)    Grave's disease    Heart murmur    High cholesterol    History of blood clots    Hypertension    Joint pain    Lactose intolerance    Lower extremity edema    Multiple food allergies    OSA (obstructive sleep apnea)    Osteoporosis    Palpitations    Panic disorder    PE (pulmonary thromboembolism) (HCC)    on Eliquis    Pharyngoesophageal dysphagia 06/24/2014   Prediabetes 05/11/2019    Protein-calorie malnutrition, mild (HCC) 06/23/2021   Pulmonary embolism (HCC) 2024   Rheumatoid arthritis (HCC)    Risk for falls    Sciatica    Severe 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 (HCC)    Sleep apnea    no cpap worn   Subclinical hypothyroidism 05/11/2019   Syncope    Syncope and collapse    Thyroid  disease    Vasovagal syncope    Vertigo    Vitamin D  deficiency    Patient presents to the department today for relation. Prior to Admission medications   Medication Sig Start Date End Date Taking? Authorizing Provider  acetaminophen  (TYLENOL ) 650 MG CR tablet Take 1,300 mg by mouth as needed for pain.    [provider]  albuterol  (PROVENTIL  HFA) 108 (90 Base) MCG/ACT inhaler Inhale 2 puffs into the lungs every 4 (four) hours as needed for wheezing or shortness of breath.    [provider]  ALPRAZolam  (XANAX ) 0.5 MG tablet Take 1-2 tabs 30 minutes prior to botox  for anxiety. Have someone drive you. 08/25/23   Colleen Onetha NOVAK, MD  azelastine  (ASTELIN ) 0.1 % nasal spray Place 2  sprays into both nostrils daily as needed for allergies. Use in each nostril as directed    [provider]  BIOTIN  PO Take 1 capsule by mouth daily.    [provider]  botulinum toxin Type A  (BOTOX ) 200 units injection INJECT 155 UNITS INTO THE  MUSCLES OF THE HEAD AND NECK  EVERY 12 WEEKS (DISCARD UNUSED  AFTER FIRST USE) 10/13/23   Colleen Onetha NOVAK, MD  budesonide-formoterol  [facility]) 80-4.5 MCG/ACT inhaler Inhale 2 puffs into the lungs as directed.    [provider]  calcium  carbonate (TUMS - DOSED IN MG ELEMENTAL CALCIUM ) 500 MG chewable tablet Chew 1,000 mg by mouth as needed for indigestion or heartburn.    [provider]  cetirizine (ZYRTEC) 10 MG tablet Take 10 mg by mouth daily. 10/20/21   [provider]  citalopram  (CELEXA ) 20 MG tablet Take 1 tablet (20 mg total) by mouth daily. See psychiatry for further  refills. 08/25/23   Colleen Onetha NOVAK, MD  CREON 36000-114000 units CPEP capsule Take 36,000 Units by mouth as directed. 01/20/23   [provider]  Cyanocobalamin  (VITAMIN B-12 PO) Take 1 capsule by mouth daily.    [provider]  diclofenac  Sodium (VOLTAREN ) 1 % GEL Apply 4 g topically 4 (four) times daily. Patient taking differently: Apply 1 Application topically as needed (pain). 06/04/22   Joshua Domino, DO  diphenhydrAMINE  (SOMINEX) 25 MG tablet Take 25 mg by mouth as needed for itching, allergies or sleep.    [provider]  divalproex  (DEPAKOTE  ER) 500 MG 24 hr tablet Take 2 tablets (1,000 mg total) by mouth at bedtime AND 1 tablet (500 mg total) daily. 11/11/22   Brutus Dales, MD  EPINEPHrine  (EPIPEN  2-PAK) 0.3 mg/0.3 mL IJ SOAJ injection Inject 0.3 mg into the muscle as needed for anaphylaxis.    [provider]  Ergocalciferol  (VITAMIN D2 PO) Take by mouth.    [provider]  fluticasone  (FLONASE ) 50 MCG/ACT nasal spray Place 1 spray into both nostrils as needed (allergies/runny nose). 06/04/22   Joshua Domino, DO  Galcanezumab -gnlm (EMGALITY ) 120 MG/ML SOAJ INJECT 120 MG INTO THE SKIN EVERY 30 DAYS 04/26/23   Colleen Onetha NOVAK, MD  hydrochlorothiazide  (HYDRODIURIL ) 12.5 MG tablet Take 12.5 mg by mouth daily.    [provider]  hydrOXYzine  (ATARAX ) 25 MG tablet Take 25 mg by mouth 3 (three) times daily.    [provider]  levofloxacin  (LEVAQUIN ) 500 MG tablet Take 500 mg by mouth daily.    [provider]  lidocaine  (LIDODERM ) 5 % Place 1 patch onto the skin daily as needed (pain).    [provider]  meclizine  (ANTIVERT ) 25 MG tablet Take 25 mg by mouth 3 (three) times daily.    [provider]  Melatonin 10 MG CHEW Chew 30 mg by mouth at bedtime as needed (sleep).    [provider]  methocarbamol  (ROBAXIN ) 500 MG tablet Take 1-2 tablets (500-1,000 mg total) by mouth every 8 (eight) hours as  needed for muscle spasms. 10/29/23   Trine Raynell Moder, MD  montelukast  (SINGULAIR ) 10 MG tablet Take 10 mg by mouth at bedtime as needed (allergies). 08/07/19   [provider]  Multiple Vitamin (MULTIVITAMIN) tablet Take 1 tablet by mouth in the morning.    [provider]  olopatadine  (PATANOL) 0.1 % ophthalmic solution Place 1 drop into both eyes daily at 6 (six) AM.    [provider]  ondansetron  (ZOFRAN -ODT) 4 MG  disintegrating tablet Take 4 mg by mouth every 8 (eight) hours as needed for nausea or vomiting.    [provider]  pantoprazole  (PROTONIX ) 20 MG tablet Take 1 tablet (20 mg total) by mouth daily. 04/21/23   Neysa Caron PARAS, DO  prazosin  (MINIPRESS ) 1 MG capsule Take 1 mg by mouth at bedtime. 02/11/23   [provider]  QUEtiapine  (SEROQUEL ) 50 MG tablet Take 50 mg by mouth 2 (two) times daily.    [provider]  raNITIdine HCl (RANITIDINE 150 MAX STRENGTH PO) Take 1 tablet by mouth daily.    [provider]  Rimegepant Sulfate  (NURTEC) 75 MG TBDP Take 1 tablet (75 mg total) by mouth daily. Patient taking differently: Take 75 mg by mouth as needed (migraines). 08/05/22   Colleen Onetha NOVAK, MD  tapentadol  HCl (NUCYNTA ) 75 MG tablet Take 75 mg by mouth 3 (three) times daily. 10/13/22   [provider]  tirzepatide CLOYDE) 7.5 MG/0.5ML Pen Inject 7.5 mg into the skin once a week.    [provider]  topiramate  (TOPAMAX ) 50 MG tablet Take 50 mg by mouth 2 (two) times daily.    [provider]  triamcinolone cream (KENALOG) 0.1 % Apply 1 Application topically 2 (two) times daily.    [provider]  trolamine salicylate (ASPERCREME) 10 % cream Apply 1 Application topically as needed for muscle pain.    [provider]    Allergies: Bee venom, Bioflavonoid products, Cocamide, Coconut (cocos nucifera), Coconut fatty acid, Coconut oil, Food, Mixed ragweed, Molds & smuts, Mushroom  ext cmplx(shiitake-reishi-mait), Other, Shellfish allergy, Strawberry extract, Ambrosia artemisiifolia (ragweed) skin test, Oxycodone , Hydrocodone -acetaminophen , and Latex    Review of Systems  Updated Vital Signs BP 122/81   Pulse 81   Temp 98.4 F (36.9 C)   Resp 16   Ht 5' 6 (1.676 m)   Wt 68 kg   SpO2 100%   BMI 24.21 kg/m   Physical Exam Vitals and nursing note reviewed.  HENT:     Head: Atraumatic.  Cardiovascular:     Rate and Rhythm: Regular rhythm.  Pulmonary:     Breath sounds: No wheezing.  Abdominal:     Tenderness: There is no abdominal tenderness.  Neurological:     Mental Status: She is alert. Mental status is at baseline.     (all labs ordered are listed, but only abnormal results are displayed) Labs Reviewed  CBC WITH DIFFERENTIAL/PLATELET - Abnormal; Notable for the following components:      Result Value   Hemoglobin 11.3 (*)    HCT 35.7 (*)    RDW 16.2 (*)    All other components within normal limits  COMPREHENSIVE METABOLIC PANEL WITH GFR - Abnormal; Notable for the following components:   Potassium 3.1 (*)    Calcium  8.6 (*)    All other components within normal limits  VALPROIC ACID  LEVEL - Abnormal; Notable for the following components:   Valproic Acid  Lvl <10 (*)    All other components within normal limits  ACETAMINOPHEN  LEVEL  AMMONIA    EKG: None  Radiology: No results found.   Procedures   Medications Ordered in the ED  ketorolac  (TORADOL ) 30 MG/ML injection 15 mg (15 mg Intravenous Given 11/24/23 1844)  prochlorperazine (COMPAZINE) injection 10 mg (10 mg Intravenous Given 11/24/23 1844)  Medical Decision Making Amount and/or Complexity of Data Reviewed Labs: ordered.  Risk Prescription drug management.   Patient with headache.  History of migraines.  States headache is last longer and more severe.  Somewhat variabileon the story however.  Reviewed previous CTs and MRIs.   Reassuring.  Will treat symptomatically.  Will treat with migraine cocktail.  However also states she has been taking 70 Tylenol  over the last 4 days.  LFTs reassuring.  Will add Tylenol  oh.  Will also get Depakote  level.  Depakote  level is low.  Patient states she does not think she is on it anymore.  Tylenol  level also not toxic.  Instructed to take less of the Tylenol .  LFTs not elevated.  On reexam patient feeling better.  Will discharge home to follow-up with neurology.     Final diagnoses:  Migraine with status migrainosus, not intractable, unspecified migraine type    ED Discharge Orders          Ordered    Ambulatory referral to Neurology       Comments: An appointment is requested in approximately: 2 weeks   11/24/23 2013               Patsey Lot, MD 11/24/23 2015

## 2023-11-24 NOTE — Telephone Encounter (Signed)
 Copied from CRM #[id number]. Topic: Clinical Concerns - Medical Question >> Nov 24, 2023  4:51 PM Amy B wrote: [name], [name] is calling other request    Include all details related to the request(s) below:  Patient calls to report that she missed a call from Provider. Patient states she is at the ED because she was not feeling well.  Reports she cannot come into the office on Monday. Is going to inquire if ED will draw Potassium levels. Patient is available by phone.    Confirm and type the Best Contact Number below:  Patient/caller contact number:     [phone]          [] Home  [x] Mobile  [] Work  []  Other   [x]  Okay to leave a voicemail   Medication List:  Current Outpatient Medications:  .  albuterol  HFA (PROVENTIL  HFA;VENTOLIN  HFA;PROAIR  HFA) 90 mcg/actuation inhaler, INHALE 2 PUFFS EVERY 6 (SIX) HOURS AS NEEDED (WHEEZING)., Disp: 6.7 each, Rfl: 1 .  azelastine  (ASTELIN ) 137 mcg (0.1 %) nasal spray, USE 1 SPRAY IN EACH NOSTRIL 2 TIMES DAILY AS NEEDED FOR RHINITIS. USE IN EACH NOSTRIL AS DIRECTED, Disp: 90 mL, Rfl: 1 .  cariprazine (Vraylar) 3 mg cap capsule, Take 3 mg by mouth daily., Disp: , Rfl:  .  cetirizine (ZyrTEC) 10 mg tablet, TAKE 1 TABLET BY MOUTH EVERY DAY, Disp: 90 tablet, Rfl: 1 .  citalopram  (CeleXA ) 20 mg tablet, Take 20 mg by mouth daily., Disp: , Rfl:  .  clonazePAM  (KlonoPIN ) 1 mg tablet, TAKE 1 TABLET TWICE DAILY FOR ANXIETY, Disp: , Rfl:  .  EPINEPHrine  (EPIPEN ) 0.3 mg/0.3 mL injection syringe, Inject 0.3 mg into the muscle once as needed for anaphylaxis., Disp: 2 each, Rfl: 1 .  fluticasone  propionate (FLONASE ) 50 mcg/spray nasal spray, Administer 1 spray into each nostril Once Daily., Disp: , Rfl:  .  galcanezumab -gnlm (Emgality  Pen) 120 mg/mL pnij, Inject 120 mg under the skin., Disp: , Rfl:  .  hydrOXYzine  (ATARAX ) 10 mg tablet, TAKE 1 TABLET BY MOUTH EVERY 6 HOURS AS NEEDED FOR ANXIETY, Disp: 90 tablet, Rfl: 0 .  lidocaine  (LIDODERM ) 5 % patch, ,  Disp: , Rfl:  .  methocarbamoL  (ROBAXIN ) 500 mg tablet, TAKE 1-2 TABLETS (500-1,000 MG TOTAL) BY MOUTH EVERY 8 (EIGHT) HOURS AS NEEDED FOR MUSCLE SPASMS., Disp: , Rfl:  .  montelukast  (SINGULAIR ) 10 mg tablet, Take 1 tablet by mouth nightly, Disp: 30 tablet, Rfl: 0 .  Mounjaro 10 mg/0.5 mL subcutaneous pen injector, Inject 10 mg under the skin every 7 days., Disp: , Rfl:  .  multivitamin (THERAGRAN) tab tablet, Take 1 tablet by mouth Once Daily., Disp: , Rfl:  .  olopatadine  (PATANOL) 0.1 % ophthalmic solution, Administer 1 drop into each eyes Once Daily., Disp: , Rfl:  .  onabotulinumtoxin type A (Botox ) 200 unit solr, INJECT 155 UNITS INTO THE  MUSCLES OF THE HEAD AND NECK  EVERY 12 WEEKS (DISCARD UNUSED  AFTER FIRST USE), Disp: , Rfl:  .  potassium chloride  (KLOR-CON ) 10 mEq ER tablet, Take one tablet daily, Disp: 30 tablet, Rfl: 0 .  prazosin  (MINIPRESS ) 1 mg capsule, TAKE 1 CAPSULE BY MOUTH EVERYDAY AT BEDTIME, Disp: 90 capsule, Rfl: 1 .  traZODone  (DESYREL ) 50 mg tablet, Take 50 mg by mouth at bedtime., Disp: , Rfl:  .  trolamine salicylate (Mobisyl) 10 % cream, Apply 1 Application topically 2 (two) times a day as needed., Disp: , Rfl:  .  vortioxetine (TRINTELLIX) 10 mg  tablet, Take 5 mg by mouth daily., Disp: , Rfl:  .  zolpidem  (AMBIEN ) 10 mg tablet, Take 10 mg by mouth nightly., Disp: , Rfl:      Medication Request/Refills: Pharmacy Information (if applicable)   [x]  Not Applicable       []  Pharmacy listed  Send Medication Request to:                                                 []  Pharmacy not listed (added to pharmacy list in Epic) Send Medication Request to:      Listed Pharmacies: [id number] [place], [place] - [address] - PHONE: [phone] - FAX: [phone]

## 2023-11-24 NOTE — ED Triage Notes (Signed)
 Patient c/o migraine headache x 6 days. Patient report nausea and vomiting x 4 since yesterday. Patient report she got dizzy with LOC for few seconds. Patient denies Fall. Patient denies hitting her head.

## 2023-11-28 ENCOUNTER — Ambulatory Visit: Admitting: Neurology

## 2023-11-28 DIAGNOSIS — G43711 Chronic migraine without aura, intractable, with status migrainosus: Secondary | ICD-10-CM | POA: Diagnosis not present

## 2023-11-28 MED ORDER — UBRELVY 100 MG PO TABS
100.0000 mg | ORAL_TABLET | ORAL | 0 refills | Status: DC | PRN
Start: 2023-11-28 — End: 2023-12-12

## 2023-11-28 MED ORDER — ONABOTULINUMTOXINA 200 UNITS IJ SOLR
155.0000 [IU] | Freq: Once | INTRAMUSCULAR | Status: AC
  Administered 2023-11-28: 165 [IU] via INTRAMUSCULAR

## 2023-11-28 NOTE — Progress Notes (Signed)
 11/28/2023: Here for botox , will return to NP. Patient feels that her migraines cause her jaw to ache in her jaw aching also can cause her migraines to worsen it is a trigger for migraines included 5 units in each masseter to see if that helps with migraine severity.   08/25/2023: She presents for Botox . She reports migraines are stable. She continues to have 4-8 migrainous headaches. She questions if Botox  is working for her. She does state they are less intense. She continues topiramate  and Emgality . Nurtec works well for abortive therapy. She takes Xanax  prior to procedure. Her son brought her to her appt. She needs to stay with me for botox  given anxiety. She ran out of her celexa  I can refill this time but have her pcp or Dr. Lauree) who she sees refill going forward  EXTREME ANXIETY WITH BOTOX . DO NOT MAKE HER WAIT. Givem xanax  to take prior to injections, son to drive(do not drive on xanax .   Meds ordered this encounter  Medications   botulinum toxin Type A  (BOTOX ) injection 155 Units    Botox - 200 units x 1 vial Lot: [id number] Expiration: 02/2026 NDC: 9976-6078-97  Bacteriostatic 0.9% Sodium Chloride - 4 mL  Lot: [id number] Expiration: 03/23/2025 NDC: 9590-8033-97  Dx: H56.288 S/P  Witnessed by Therisa SQUIBB CMA   Ubrogepant  (UBRELVY ) 100 MG TABS    Sig: Take 1 tablet (100 mg total) by mouth every 2 (two) hours as needed. Maximum 200mg  a day.    Dispense:  6 tablet    Refill:  0      Consent Form Botulism Toxin Injection For Chronic Migraine    Reviewed orally with patient, additionally signature is on file:  Botulism toxin has been approved by the Federal drug administration for treatment of chronic migraine. Botulism toxin does not cure chronic migraine and it may not be effective in some patients.  The administration of botulism toxin is accomplished by injecting a small amount of toxin into the muscles of the neck and head. Dosage must be titrated for each  individual. Any benefits resulting from botulism toxin tend to wear off after 3 months with a repeat injection required if benefit is to be maintained. Injections are usually done every 3-4 months with maximum effect peak achieved by about 2 or 3 weeks. Botulism toxin is expensive and you should be sure of what costs you will incur resulting from the injection.  The side effects of botulism toxin use for chronic migraine may include:   -Transient, and usually mild, facial weakness with facial injections  -Transient, and usually mild, head or neck weakness with head/neck injections  -Reduction or loss of forehead facial animation due to forehead muscle weakness  -Eyelid drooping  -Dry eye  -Pain at the site of injection or bruising at the site of injection  -Double vision  -Potential unknown long term risks   Contraindications: You should not have Botox  if you are pregnant, nursing, allergic to albumin, have an infection, skin condition, or muscle weakness at the site of the injection, or have myasthenia gravis, Lambert-Eaton syndrome, or ALS.  It is also possible that as with any injection, there may be an allergic reaction or no effect from the medication. Reduced effectiveness after repeated injections is sometimes seen and rarely infection at the injection site may occur. All care will be taken to prevent these side effects. If therapy is given over a long time, atrophy and wasting in the muscle injected may occur. Occasionally the patient's become  refractory to treatment because they develop antibodies to the toxin. In this event, therapy needs to be modified.  I have read the above information and consent to the administration of botulism toxin.    BOTOX  PROCEDURE NOTE FOR MIGRAINE HEADACHE  Contraindications and precautions discussed with patient(above). Aseptic procedure was observed and patient tolerated procedure. Procedure performed by Greig Forbes, FNP-C.   The condition has existed  for more than 6 months, and pt does not have a diagnosis of ALS, Myasthenia Gravis or Lambert-Eaton Syndrome.  Risks and benefits of injections discussed and pt agrees to proceed with the procedure.  Written consent obtained  These injections are medically necessary. Pt  receives good benefits from these injections. These injections do not cause sedations or hallucinations which the oral therapies may cause.   Description of procedure:  The patient was placed in a sitting position. The standard protocol was used for Botox  as follows, with 5 units of Botox  injected at each site:  -Procerus muscle, midline injection  -Corrugator muscle, bilateral injection  -Frontalis muscle, bilateral injection, with 2 sites each side, medial injection was performed in the upper one third of the frontalis muscle, in the region vertical from the medial inferior edge of the superior orbital rim. The lateral injection was again in the upper one third of the forehead vertically above the lateral limbus of the cornea, 1.5 cm lateral to the medial injection site.  -Temporalis muscle injection, 4 sites, bilaterally. The first injection was 3 cm above the tragus of the ear, second injection site was 1.5 cm to 3 cm up from the first injection site in line with the tragus of the ear. The third injection site was 1.5-3 cm forward between the first 2 injection sites. The fourth injection site was 1.5 cm posterior to the second injection site. 5th site laterally in the temporalis  muscleat the level of the outer canthus.  -Occipitalis muscle injection, 3 sites, bilaterally. The first injection was done one half way between the occipital protuberance and the tip of the mastoid process behind the ear. The second injection site was done lateral and superior to the first, 1 fingerbreadth from the first injection. The third injection site was 1 fingerbreadth superiorly and medially from the first injection site.  -Cervical  paraspinal muscle injection, 2 sites, bilaterally. The first injection site was 1 cm from the midline of the cervical spine, 3 cm inferior to the lower border of the occipital protuberance. The second injection site was 1.5 cm superiorly and laterally to the first injection site.  -Trapezius muscle injection was performed at 3 sites, bilaterally. The first injection site was in the upper trapezius muscle halfway between the inflection point of the neck, and the acromion. The second injection site was one half way between the acromion and the first injection site. The third injection was done between the first injection site and the inflection point of the neck.   Will return for repeat injection in 3 months.   A total of 200 units of Botox  was prepared, 165 units of Botox  was injected as documented above, 35 Botox  not injected was wasted. The patient tolerated the procedure well, there were no complications of the above procedure.

## 2023-11-28 NOTE — Progress Notes (Signed)
 Botox - 200 units x 1 vial Lot: [id number] Expiration: 02/2026 NDC: 9976-6078-97  Bacteriostatic 0.9% Sodium Chloride - 4 mL  Lot: [id number] Expiration: 03/23/2025 NDC: 9590-8033-97  Dx: H56.288 S/P  Witnessed by Therisa SQUIBB CMA

## 2023-11-29 ENCOUNTER — Telehealth: Payer: Self-pay | Admitting: Neurology

## 2023-11-29 DIAGNOSIS — G43711 Chronic migraine without aura, intractable, with status migrainosus: Secondary | ICD-10-CM

## 2023-11-29 NOTE — Telephone Encounter (Signed)
 Patient said having a real bad migraine. Have took 2 tablets of  Ubrogepant  (UBRELVY ) 100 MG TABS today and yesterday, also Tramadol  prescribed by PCP got some releif after taking 2 tablets. Could Dr. Ines call me in something in?

## 2023-11-30 ENCOUNTER — Ambulatory Visit

## 2023-11-30 ENCOUNTER — Telehealth: Admitting: Neurology

## 2023-11-30 DIAGNOSIS — R4586 Emotional lability: Secondary | ICD-10-CM | POA: Insufficient documentation

## 2023-11-30 DIAGNOSIS — L987 Excessive and redundant skin and subcutaneous tissue: Secondary | ICD-10-CM | POA: Insufficient documentation

## 2023-11-30 DIAGNOSIS — M4727 Other spondylosis with radiculopathy, lumbosacral region: Secondary | ICD-10-CM | POA: Insufficient documentation

## 2023-11-30 DIAGNOSIS — K5903 Drug induced constipation: Secondary | ICD-10-CM | POA: Insufficient documentation

## 2023-11-30 DIAGNOSIS — Z79891 Long term (current) use of opiate analgesic: Secondary | ICD-10-CM | POA: Insufficient documentation

## 2023-11-30 DIAGNOSIS — R632 Polyphagia: Secondary | ICD-10-CM | POA: Insufficient documentation

## 2023-11-30 DIAGNOSIS — G894 Chronic pain syndrome: Secondary | ICD-10-CM | POA: Insufficient documentation

## 2023-11-30 DIAGNOSIS — R0781 Pleurodynia: Secondary | ICD-10-CM | POA: Insufficient documentation

## 2023-11-30 DIAGNOSIS — E049 Nontoxic goiter, unspecified: Secondary | ICD-10-CM | POA: Insufficient documentation

## 2023-11-30 DIAGNOSIS — R52 Pain, unspecified: Secondary | ICD-10-CM | POA: Insufficient documentation

## 2023-11-30 DIAGNOSIS — E65 Localized adiposity: Secondary | ICD-10-CM | POA: Insufficient documentation

## 2023-12-01 ENCOUNTER — Emergency Department (HOSPITAL_COMMUNITY)
Admission: EM | Admit: 2023-12-01 | Discharge: 2023-12-01 | Disposition: A | Discharge status: Home / Self Care | Attending: Emergency Medicine | Admitting: Emergency Medicine

## 2023-12-01 ENCOUNTER — Encounter (HOSPITAL_COMMUNITY): Payer: Self-pay

## 2023-12-01 ENCOUNTER — Emergency Department (HOSPITAL_COMMUNITY)

## 2023-12-01 ENCOUNTER — Other Ambulatory Visit: Payer: Self-pay

## 2023-12-01 DIAGNOSIS — Z79899 Other long term (current) drug therapy: Secondary | ICD-10-CM | POA: Insufficient documentation

## 2023-12-01 DIAGNOSIS — E876 Hypokalemia: Secondary | ICD-10-CM | POA: Insufficient documentation

## 2023-12-01 DIAGNOSIS — R519 Headache, unspecified: Secondary | ICD-10-CM | POA: Diagnosis present

## 2023-12-01 DIAGNOSIS — R110 Nausea: Secondary | ICD-10-CM | POA: Diagnosis not present

## 2023-12-01 DIAGNOSIS — D649 Anemia, unspecified: Secondary | ICD-10-CM | POA: Insufficient documentation

## 2023-12-01 DIAGNOSIS — Z91040 Latex allergy status: Secondary | ICD-10-CM | POA: Diagnosis not present

## 2023-12-01 DIAGNOSIS — Z7901 Long term (current) use of anticoagulants: Secondary | ICD-10-CM | POA: Diagnosis not present

## 2023-12-01 DIAGNOSIS — M79662 Pain in left lower leg: Secondary | ICD-10-CM | POA: Diagnosis not present

## 2023-12-01 LAB — CBC WITH DIFFERENTIAL/PLATELET
Abs Immature Granulocytes: 0.01 K/uL (ref 0.00–0.07)
Basophils Absolute: 0 K/uL (ref 0.0–0.1)
Basophils Relative: 1 %
Eosinophils Absolute: 0.2 K/uL (ref 0.0–0.5)
Eosinophils Relative: 3 %
HCT: 34.5 % — ABNORMAL LOW (ref 36.0–46.0)
Hemoglobin: 11 g/dL — ABNORMAL LOW (ref 12.0–15.0)
Immature Granulocytes: 0 %
Lymphocytes Relative: 27 %
Lymphs Abs: 1.2 K/uL (ref 0.7–4.0)
MCH: 29.8 pg (ref 26.0–34.0)
MCHC: 31.9 g/dL (ref 30.0–36.0)
MCV: 93.5 fL (ref 80.0–100.0)
Monocytes Absolute: 0.7 K/uL (ref 0.1–1.0)
Monocytes Relative: 17 %
Neutro Abs: 2.3 K/uL (ref 1.7–7.7)
Neutrophils Relative %: 52 %
Platelets: 217 K/uL (ref 150–400)
RBC: 3.69 MIL/uL — ABNORMAL LOW (ref 3.87–5.11)
RDW: 16.7 % — ABNORMAL HIGH (ref 11.5–15.5)
WBC: 4.4 K/uL (ref 4.0–10.5)
nRBC: 0 % (ref 0.0–0.2)

## 2023-12-01 LAB — COMPREHENSIVE METABOLIC PANEL WITH GFR
ALT: 18 U/L (ref 0–44)
AST: 22 U/L (ref 15–41)
Albumin: 3.5 g/dL (ref 3.5–5.0)
Alkaline Phosphatase: 44 U/L (ref 38–126)
Anion gap: 9 (ref 5–15)
BUN: 12 mg/dL (ref 6–20)
CO2: 26 mmol/L (ref 22–32)
Calcium: 8.7 mg/dL — ABNORMAL LOW (ref 8.9–10.3)
Chloride: 105 mmol/L (ref 98–111)
Creatinine, Ser: 0.8 mg/dL (ref 0.44–1.00)
GFR, Estimated: >60 mL/min (ref >60)
Glucose, Bld: 87 mg/dL (ref 70–99)
Potassium: 3.3 mmol/L — ABNORMAL LOW (ref 3.5–5.1)
Sodium: 140 mmol/L (ref 135–145)
Total Bilirubin: 0.6 mg/dL (ref 0.0–1.2)
Total Protein: 6.6 g/dL (ref 6.5–8.1)

## 2023-12-01 MED ORDER — SODIUM CHLORIDE 0.9 % IV BOLUS
1000.0000 mL | Freq: Once | INTRAVENOUS | Status: AC
  Administered 2023-12-01: 1000 mL via INTRAVENOUS

## 2023-12-01 MED ORDER — DEXAMETHASONE SODIUM PHOSPHATE 10 MG/ML IJ SOLN
10.0000 mg | Freq: Once | INTRAMUSCULAR | Status: AC
  Administered 2023-12-01: 10 mg via INTRAVENOUS
  Filled 2023-12-01: qty 1

## 2023-12-01 MED ORDER — MAGNESIUM SULFATE 2 GM/50ML IV SOLN
2.0000 g | Freq: Once | INTRAVENOUS | Status: AC
  Administered 2023-12-01: 2 g via INTRAVENOUS
  Filled 2023-12-01: qty 50

## 2023-12-01 MED ORDER — KETOROLAC TROMETHAMINE 30 MG/ML IJ SOLN
30.0000 mg | Freq: Once | INTRAMUSCULAR | Status: AC
  Administered 2023-12-01: 30 mg via INTRAVENOUS
  Filled 2023-12-01: qty 1

## 2023-12-01 MED ORDER — PROCHLORPERAZINE EDISYLATE 10 MG/2ML IJ SOLN
10.0000 mg | Freq: Once | INTRAMUSCULAR | Status: AC
  Administered 2023-12-01: 10 mg via INTRAVENOUS
  Filled 2023-12-01: qty 2

## 2023-12-01 NOTE — Progress Notes (Signed)
 LLE venous duplex has been completed.  Preliminary results given to Dr. Towana.   Results can be found under chart review under CV PROC. 12/01/2023 3:59 PM Rachelle Edwards RVT, RDMS

## 2023-12-01 NOTE — ED Triage Notes (Signed)
 Patient presented to ER with migraine headache x14 days. Patient stated she has been seen in ER, PCP, and migraine doctor and has had no relief. Patient states she has had multiple syncopal episodes, 2 today, from her headache. Denies hitting head, denies taking blood thinners.

## 2023-12-01 NOTE — Discharge Instructions (Signed)
 Please rest and drink plenty of fluids.  Reach out to your neurologist to see if they have any further recommendations.  Return if any high fevers or worsening symptoms.

## 2023-12-01 NOTE — ED Provider Notes (Signed)
 [place] EMERGENCY DEPARTMENT AT [facility] Provider Note   CSN: [id number] Arrival date & time: 12/01/23  1222     Patient presents with: Migraine   [name] is a 58 y.o. female.  She has a history of migraine headaches and has had a migraine for the last 2 weeks.  She seen her primary care doctor and was here in the ER and got a migraine cocktail.  She saw her neurologist a few days ago and was given Botox .  She has been taking all the medications that usually help and nothing seems to be working.  Headache is slightly different than usual and that it is more posterior than frontal.  Associated with nausea.  She also tells me that she has a blood clot in her left lower leg that her vascular team has been watching.  She is afraid it may be worsening.  She is on chronic blood thinners.   The history is provided by the patient.  Migraine This is a recurrent problem. The current episode started more than 1 week ago. The problem occurs constantly. The problem has not changed since onset.Associated symptoms include headaches. Pertinent negatives include no chest pain, no abdominal pain and no shortness of breath.       Prior to Admission medications   Medication Sig Start Date End Date Taking? Authorizing Provider  acetaminophen  (TYLENOL ) 650 MG CR tablet Take 1,300 mg by mouth as needed for pain.    [provider]  albuterol  (PROVENTIL  HFA) 108 (90 Base) MCG/ACT inhaler Inhale 2 puffs into the lungs every 4 (four) hours as needed for wheezing or shortness of breath.    [provider]  ALPRAZolam  (XANAX ) 0.5 MG tablet Take 1-2 tabs 30 minutes prior to botox  for anxiety. Have someone drive you. 08/25/23   Ines Onetha NOVAK, MD  ALPRAZolam  (XANAX ) 0.5 MG tablet Take 0.5 mg by mouth 2 (two) times daily.    [provider]  apixaban  (ELIQUIS ) 5 MG TABS tablet Take 5 mg by mouth 2 (two) times daily.    [provider]  azelastine  (ASTELIN )  0.1 % nasal spray Place 2 sprays into both nostrils daily as needed for allergies. Use in each nostril as directed    [provider]  BIOTIN  PO Take 1 capsule by mouth daily.    [provider]  botulinum toxin Type A  (BOTOX ) 200 units injection INJECT 155 UNITS INTO THE  MUSCLES OF THE HEAD AND NECK  EVERY 12 WEEKS (DISCARD UNUSED  AFTER FIRST USE) 10/13/23   Ines Onetha NOVAK, MD  budesonide-formoterol  [facility]) 80-4.5 MCG/ACT inhaler Inhale 2 puffs into the lungs as directed.    [provider]  calcium  carbonate (TUMS - DOSED IN MG ELEMENTAL CALCIUM ) 500 MG chewable tablet Chew 1,000 mg by mouth as needed for indigestion or heartburn.    [provider]  cetirizine (ZYRTEC) 10 MG tablet Take 10 mg by mouth daily. 10/20/21   [provider]  cetirizine (ZYRTEC) 10 MG tablet Take 1 tablet by mouth daily. 04/19/23   [provider]  citalopram  (CELEXA ) 20 MG tablet Take 20 mg by mouth daily.    [provider]  clonazePAM  (KLONOPIN ) 1 MG tablet Take 1 mg by mouth 2 (two) times daily.    [provider]  CREON 36000-114000 units CPEP capsule Take 36,000 Units by mouth as directed. 01/20/23   [provider]  [employer] ACETAMINOPHEN  EX ST 500 MG tablet Take 1,000  mg by mouth every 6 (six) hours as needed. 07/15/23   [provider]  Cyanocobalamin  (VITAMIN B-12 PO) Take 1 capsule by mouth daily.    [provider]  diclofenac  Sodium (VOLTAREN ) 1 % GEL Apply 4 g topically 4 (four) times daily. 06/04/22   Joshua Domino, DO  diphenhydrAMINE  (SOMINEX) 25 MG tablet Take 25 mg by mouth as needed for itching, allergies or sleep.    [provider]  EPINEPHrine  (EPIPEN  2-PAK) 0.3 mg/0.3 mL IJ SOAJ injection Inject 0.3 mg into the muscle as needed for anaphylaxis.    [provider]  Ergocalciferol  (VITAMIN D2 PO) Take by mouth.    [provider]  eszopiclone (LUNESTA) 1 MG TABS tablet Take 1 mg  by mouth at bedtime as needed. 10/21/23   [provider]  fluticasone  (FLONASE ) 50 MCG/ACT nasal spray Place 1 spray into both nostrils as needed (allergies/runny nose). 06/04/22   Joshua Domino, DO  gabapentin  (NEURONTIN ) 300 MG capsule Take 300 mg by mouth daily at 2 PM.    [provider]  gabapentin  (NEURONTIN ) 400 MG capsule Take 400 mg by mouth at bedtime. 07/25/23   [provider]  Galcanezumab -gnlm (EMGALITY ) 120 MG/ML SOAJ INJECT 120 MG INTO THE SKIN EVERY 30 DAYS 04/26/23   Ines Onetha NOVAK, MD  hydrochlorothiazide  (HYDRODIURIL ) 12.5 MG tablet Take 12.5 mg by mouth daily.    [provider]  hydrOXYzine  (ATARAX ) 10 MG tablet Take 10 mg by mouth every 6 (six) hours as needed. for anxiety 11/28/23   [provider]  hydrOXYzine  (ATARAX ) 10 MG tablet Take 10 mg by mouth every 6 (six) hours as needed. 09/26/23   [provider]  hydrOXYzine  (ATARAX ) 10 MG tablet Take 10 mg by mouth 3 (three) times daily as needed. 11/28/23   [provider]  hydrOXYzine  (ATARAX ) 25 MG tablet Take 25 mg by mouth 3 (three) times daily.    [provider]  ibuprofen  (ADVIL ) 800 MG tablet Take 800 mg by mouth every 8 (eight) hours as needed. 06/11/23   [provider]  lamoTRIgine  (LAMICTAL ) 100 MG tablet Take 100 mg by mouth daily. 11/27/23   [provider]  lamoTRIgine  (LAMICTAL ) 150 MG tablet Take 150 mg by mouth daily. 11/17/23   [provider]  lamoTRIgine  (LAMICTAL ) 25 MG tablet Take 25 mg by mouth daily. 08/31/23   [provider]  levofloxacin  (LEVAQUIN ) 500 MG tablet Take 500 mg by mouth daily.    [provider]  lidocaine  (LIDODERM ) 5 % Place 1 patch onto the skin daily as needed (pain).    [provider]  LINZESS  72 MCG capsule Take 72 mcg by mouth daily before breakfast.    [provider]  meclizine  (ANTIVERT ) 25 MG tablet Take 25 mg by mouth 3 (three) times daily.    [provider]  Melatonin 10 MG CHEW Chew 30 mg by mouth at bedtime as needed (sleep).    [provider]  methocarbamol  (ROBAXIN ) 500 MG tablet Take 1-2 tablets (500-1,000 mg total) by mouth every 8 (eight) hours as needed for muscle spasms. 10/29/23   Trine Raynell Moder, MD  montelukast  (SINGULAIR ) 10 MG tablet Take 10 mg by mouth at bedtime as needed (allergies). 08/07/19   [provider]  MOUNJARO 10 MG/0.5ML Pen Inject 10 mg into the skin once a week. 09/24/23   [provider]  Multiple Vitamin (MULTIVITAMIN) tablet Take 1 tablet by mouth in the morning.  [provider]  naloxone  (NARCAN ) nasal spray 4 mg/0.1 mL Place 1 spray into the nose once. 06/02/23   [provider]  olopatadine  (PATANOL) 0.1 % ophthalmic solution Place 1 drop into both eyes daily at 6 (six) AM.    [provider]  pantoprazole  (PROTONIX ) 20 MG tablet Take 1 tablet (20 mg total) by mouth daily. 04/21/23   Neysa Caron PARAS, DO  phentermine  15 MG capsule Take 1 capsule by mouth every morning.    [provider]  potassium chloride  (KLOR-CON  M) 10 MEQ tablet Take 10 mEq by mouth daily.    [provider]  prazosin  (MINIPRESS ) 1 MG capsule Take 1 mg by mouth at bedtime. 02/11/23   [provider]  prazosin  (MINIPRESS ) 1 MG capsule Take 1 mg by mouth at bedtime. 11/24/23   [provider]  QUEtiapine  (SEROQUEL ) 25 MG tablet Take 25 mg by mouth at bedtime. 11/18/23   [provider]  QUEtiapine  (SEROQUEL ) 50 MG tablet Take 50 mg by mouth 2 (two) times daily.    [provider]  raNITIdine HCl (RANITIDINE 150 MAX STRENGTH PO) Take 1 tablet by mouth daily. Patient not taking: Reported on 11/28/2023    [provider]  tapentadol  (NUCYNTA ) 50 MG tablet Take 50 mg by mouth as needed. 06/02/23   [provider]  tirzepatide CLOYDE) 10 MG/0.5ML Pen Inject 10 mg into the skin once a week.    [provider]  tirzepatide CLOYDE) 7.5 MG/0.5ML Pen Inject 7.5 mg into the skin once a week.    [provider]  tirzepatide CLOYDE) 7.5 MG/0.5ML Pen Inject 7.5 mg into the skin once a week.    [provider]  topiramate  (TOPAMAX ) 50 MG tablet Take 50 mg by mouth 2 (two) times daily.    [provider]  traMADol  (ULTRAM ) 50 MG tablet Take 50 mg by mouth every 8 (eight) hours as needed. 11/17/23   [provider]  traZODone  (DESYREL ) 100 MG tablet Take 100-200 mg by mouth at bedtime as needed. 11/04/23   [provider]  traZODone  (DESYREL ) 50 MG tablet Take 50 mg by mouth at bedtime. 10/10/23   [provider]  triamcinolone cream (KENALOG) 0.1 % Apply 1 Application topically 2 (two) times daily. 07/11/18   [provider]  TRINTELLIX 10 MG TABS tablet Take 10 mg by mouth daily.    [provider]  TRINTELLIX 20 MG TABS tablet Take 20 mg by mouth daily. 11/04/23   [provider]  TRINTELLIX 5 MG TABS tablet Take 5 mg by mouth daily.    [provider]  trolamine salicylate (MOBISYL) 10 % cream Apply 1 Application topically 2 (two) times daily as needed. 12/30/16   [provider]  TROLAMINE SALICYLATE EX Apply 1 Application topically in the morning and at bedtime.    [provider]  Ubrogepant  (UBRELVY ) 100 MG TABS Take 1 tablet (100 mg total) by mouth every 2 (two) hours as needed. Maximum 200mg  a day. 11/28/23   Ines Onetha NOVAK, MD  UNKNOWN TO PATIENT Caltrate + D3 (600mg  plus 40mcg): Take 1 tablet by mouth once daily.    [provider]  VRAYLAR 3 MG capsule Take 3 mg by mouth daily.    [provider]  zolpidem  (AMBIEN ) 10 MG tablet Take 10 mg by mouth at bedtime. 08/31/23   [provider]    Allergies: Bee venom, Bioflavonoid products, Cocamide, Coconut (cocos nucifera), Coconut fatty  acid, Coconut oil, Food, Mixed ragweed, Molds & smuts, Mushroom ext  cmplx(shiitake-reishi-mait), Other, Shellfish allergy, Strawberry extract, Ambrosia artemisiifolia (ragweed) skin test, Doxepin , Oxycodone , Hydrocodone -acetaminophen , and Latex    Review of Systems  Constitutional:  Negative for fever.  Respiratory:  Negative for shortness of breath.   Cardiovascular:  Negative for chest pain.  Gastrointestinal:  Positive for nausea and vomiting. Negative for abdominal pain.  Neurological:  Positive for headaches.    Updated Vital Signs BP 132/77 (BP Location: Left Arm)   Pulse 81   Temp 98.5 F (36.9 C) (Oral)   Resp 16   Ht 5' 6 (1.676 m)   Wt 68 kg   SpO2 100%   BMI 24.20 kg/m   Physical Exam Vitals and nursing note reviewed.  Constitutional:      General: She is not in acute distress.    Appearance: Normal appearance. She is well-developed.  HENT:     Head: Normocephalic and atraumatic.  Eyes:     Conjunctiva/sclera: Conjunctivae normal.  Cardiovascular:     Rate and Rhythm: Normal rate and regular rhythm.     Heart sounds: No murmur heard. Pulmonary:     Effort: Pulmonary effort is normal. No respiratory distress.     Breath sounds: Normal breath sounds. No stridor. No wheezing.  Abdominal:     Palpations: Abdomen is soft.     Tenderness: There is no abdominal tenderness. There is no guarding or rebound.  Musculoskeletal:        General: No tenderness or deformity.     Cervical back: Neck supple.  Skin:    General: Skin is warm and dry.  Neurological:     General: No focal deficit present.     Mental Status: She is alert.     GCS: GCS eye subscore is 4. GCS verbal subscore is 5. GCS motor subscore is 6.     Motor: No weakness.     (all labs ordered are listed, but only abnormal results are displayed) Labs Reviewed  COMPREHENSIVE METABOLIC PANEL WITH GFR - Abnormal; Notable for the following components:      Result Value   Potassium 3.3 (*)    Calcium  8.7 (*)    All other components within normal limits  CBC WITH  DIFFERENTIAL/PLATELET - Abnormal; Notable for the following components:   RBC 3.69 (*)    Hemoglobin 11.0 (*)    HCT 34.5 (*)    RDW 16.7 (*)    All other components within normal limits    EKG: EKG Interpretation Date/Time:  Thursday December 01 2023 14:06:26 EDT Ventricular Rate:  74 PR Interval:  190 QRS Duration:  86 QT Interval:  510 QTC Calculation: 566 R Axis:   -6  Text Interpretation: Sinus rhythm Abnormal R-wave progression, early transition Left ventricular hypertrophy Nonspecific T abnrm, anterolateral leads Prolonged QT interval prolonged QT new from prior 11/24 Confirmed by Towana Sharper [phone]) on 12/01/2023 2:11:20 PM  Radiology: VAS US  LOWER EXTREMITY VENOUS (DVT) (7a-7p) Result Date: 12/01/2023  Lower Venous DVT Study Patient Name:  ELBERTA LACHAPELLE  Date of Exam:   12/01/2023 Medical Rec #: [id number]        Accession #:    [id number] Date of Birth: [birth year]         Patient Gender: F Patient Age:   63 years Exam Location:  [facility] Procedure:      VAS US  LOWER EXTREMITY VENOUS (DVT) Referring Phys: SHARPER Fatma Rutten --------------------------------------------------------------------------------  Indications: SVT seen in  posterior thigh 2 months ago at vein clinic.  Risk Factors: Hx of varicose vein treatment at [facility]. Comparison Study: Previous exam on 10/06/2022 was negative for DVT Performing Technologist: Ezzie Potters RVT, RDMS  Examination Guidelines: A complete evaluation includes B-mode imaging, spectral Doppler, color Doppler, and power Doppler as needed of all accessible portions of each vessel. Bilateral testing is considered an integral part of a complete examination. Limited examinations for reoccurring indications may be performed as noted. The reflux portion of the exam is performed with the patient in reverse Trendelenburg.  +-----+---------------+---------+-----------+----------+--------------+  RIGHTCompressibilityPhasicitySpontaneityPropertiesThrombus Aging +-----+---------------+---------+-----------+----------+--------------+ CFV  Full           Yes      Yes                                 +-----+---------------+---------+-----------+----------+--------------+   +---------+---------------+---------+-----------+----------+--------------+ LEFT     CompressibilityPhasicitySpontaneityPropertiesThrombus Aging +---------+---------------+---------+-----------+----------+--------------+ CFV      Full           Yes      Yes                                 +---------+---------------+---------+-----------+----------+--------------+ SFJ      Full                                                        +---------+---------------+---------+-----------+----------+--------------+ FV Prox  Full           Yes      Yes                                 +---------+---------------+---------+-----------+----------+--------------+ FV Mid   Full           Yes      Yes                                 +---------+---------------+---------+-----------+----------+--------------+ FV DistalFull           Yes      Yes                                 +---------+---------------+---------+-----------+----------+--------------+ PFV      Full                                                        +---------+---------------+---------+-----------+----------+--------------+ POP      Full           Yes      Yes                                 +---------+---------------+---------+-----------+----------+--------------+ PTV      Full                                                        +---------+---------------+---------+-----------+----------+--------------+  PERO     Full                                                        +---------+---------------+---------+-----------+----------+--------------+     Summary: RIGHT: - No evidence of common femoral vein  obstruction.   LEFT: - There is no evidence of deep vein thrombosis in the lower extremity.  - No cystic structure found in the popliteal fossa.  *See table(s) above for measurements and observations. Electronically signed by Lonni Gaskins MD on 12/01/2023 at 4:39:09 PM.    Final    CT Head Wo Contrast Result Date: 12/01/2023 CLINICAL DATA:  Headache, increasing frequency or severity EXAM: CT HEAD WITHOUT CONTRAST TECHNIQUE: Contiguous axial images were obtained from the base of the skull through the vertex without intravenous contrast. RADIATION DOSE REDUCTION: This exam was performed according to the departmental dose-optimization program which includes automated exposure control, adjustment of the mA and/or kV according to patient size and/or use of iterative reconstruction technique. COMPARISON:  Multiple, most recently October 28, 2023 FINDINGS: Brain: The ventricles appear age appropriate. No mass effect or midline shift. Gray-white differentiation is preserved without focal attenuation abnormality.No evidence of acute territorial infarction, extra-axial fluid collection, hemorrhage, or mass lesion. The basilar cisterns are patent without downward herniation. The cerebellar hemispheres and vermis are well formed without mass lesion or focal attenuation abnormality. Vascular: No hyperdense vessel. Skull: Normal. Negative for fracture or focal lesion. Sinuses/Orbits: The paranasal sinuses and mastoids are clear.The globes appear intact. No retrobulbar hematoma. Other: None. IMPRESSION: No acute intracranial abnormality, specifically, no acute hemorrhage, territorial infarction, or intracranial mass. Electronically Signed   By: Rogelia Myers M.D.   On: 12/01/2023 15:44     Procedures   Medications Ordered in the ED  sodium chloride  0.9 % bolus 1,000 mL (has no administration in time range)  ketorolac  (TORADOL ) 30 MG/ML injection 30 mg (has no administration in time range)  dexamethasone  (DECADRON )  injection 10 mg (has no administration in time range)  magnesium  sulfate IVPB 2 g 50 mL (has no administration in time range)  prochlorperazine  (COMPAZINE ) injection 10 mg (has no administration in time range)    Clinical Course as of 12/01/23 1815  Thu Dec 01, 2023  1545 Duplex is prelim negative for acute DVT. [MB]  1600 Patient's workup has been fairly unremarkable.  She said her headache is improved from a 10 to about a 6 but she was hoping it would be improved tomorrow.  She will reach out to her neurologist for further recommendations. [MB]    Clinical Course User Index [MB] Towana Ozell BROCKS, MD                                 Medical Decision Making Amount and/or Complexity of Data Reviewed Labs: ordered. Radiology: ordered.  Risk Prescription drug management.   This patient complains of migraine headache, concerns for DVT; this involves an extensive number of treatment Options and is a complaint that carries with it a high risk of complications and morbidity. The differential includes migraine, bleed, DVT, PE,  I ordered, reviewed and interpreted labs, which included CBC with stable low hemoglobin, chemistries with low potassium I ordered medication migraine cocktail and reviewed PMP when indicated. I ordered  imaging studies which included head CT and venous duplex left lower extremity and I independently    visualized and interpreted imaging which showed no acute findings Previous records obtained and reviewed in epic including recent neurology notes Cardiac monitoring reviewed, normal sinus rhythm Social determinants considered, depression Critical Interventions: None  After the interventions stated above, I reevaluated the patient and found patient states her headache had mildly improved from 10 out of 10 to 6 out of 10 Admission and further testing considered, she is comfortable plan for discharge and will follow-up with her neurology team.  Return instructions  discussed      Final diagnoses:  Acute intractable headache, unspecified headache type    ED Discharge Orders     None          Towana Ozell BROCKS, MD 12/01/23 [phone]

## 2023-12-05 ENCOUNTER — Other Ambulatory Visit: Payer: Self-pay | Admitting: Neurology

## 2023-12-05 MED ORDER — METHYLPREDNISOLONE 4 MG PO TBPK
ORAL_TABLET | ORAL | 0 refills | Status: DC
Start: 2023-12-05 — End: 2023-12-12

## 2023-12-05 NOTE — Telephone Encounter (Signed)
 I called pt and let her know that Dr. Gregg, my WID did prescribe medrol  dosepack for her to take and hopefully will break the migraine cycle she is in.  I told her to stop taking the tylenol , she recently had a tylenol   level on the 11-24-2023 and was 12 (normal level).  She will call us  back as needed.  She appreciated call back.

## 2023-12-05 NOTE — Telephone Encounter (Signed)
 I called pt and she has appt with pcp she says on Wednesday will get done then.  Santana Molt np is who she see's if she does not have there then will get done here.

## 2023-12-05 NOTE — Addendum Note (Signed)
 Addended by: NEYSA NENA RAMAN on: 12/05/2023 04:40 PM   Modules accepted: Orders

## 2023-12-05 NOTE — Telephone Encounter (Signed)
 Yes, I will send her a Medrol  dose pack but she she should stop taking Tylenol  having completing blood test to check her liver.

## 2023-12-05 NOTE — Telephone Encounter (Signed)
 Pt called to inform that she has went to urgent care and they didi cat scan but  could not see anything about migraine. Pt  request to speak   nurse or MD about Prescribing  Pain medication. Pt is aware she has an up coming appt Scheduled

## 2023-12-05 NOTE — Telephone Encounter (Signed)
 I called pt.  She has been suffering with migraine for the last 17 days. Level 10 (crying so bad).   She was seen for botox  11-28-2023 (given Ubrelvy  6 tabs), maybe helped some, but ended going to ED, had CT head negative. Migraine cocktail level 10 to 6 , but returned the next day.  She is nauseated, stated that she has sinus pain.  She has been followed by her vascular team for varicose veins and has had shots in her L leg, and has small blood clot that they are watching and are doing nothing about at this time.  She has appt on 12-12-2023 to see Dr. Ines, for med management.  She stated she has tried steroids in past and would like to try and see if can break this cycle of migraine.  She proceeded to tell me that she has taken 80 tabs of tylenol  (500mg ) 3 tabs at at ime over the lat 11 days.  (I reiterated about taking too much can cause liver failure, and rebound headaches).  She has call into her pcp, too, but they say to call your neurologist.

## 2023-12-06 NOTE — Progress Notes (Signed)
 Patient will be in this week. She is aware of labs

## 2023-12-12 ENCOUNTER — Ambulatory Visit (INDEPENDENT_AMBULATORY_CARE_PROVIDER_SITE_OTHER): Admitting: Neurology

## 2023-12-12 ENCOUNTER — Encounter: Payer: Self-pay | Admitting: Neurology

## 2023-12-12 VITALS — BP 142/89 | HR 87 | Ht 66.0 in | Wt 154.0 lb

## 2023-12-12 DIAGNOSIS — M7918 Myalgia, other site: Secondary | ICD-10-CM

## 2023-12-12 DIAGNOSIS — M542 Cervicalgia: Secondary | ICD-10-CM | POA: Diagnosis not present

## 2023-12-12 DIAGNOSIS — G43709 Chronic migraine without aura, not intractable, without status migrainosus: Secondary | ICD-10-CM | POA: Diagnosis not present

## 2023-12-12 DIAGNOSIS — R519 Headache, unspecified: Secondary | ICD-10-CM

## 2023-12-12 MED ORDER — KETOROLAC TROMETHAMINE 60 MG/2ML IM SOLN
60.0000 mg | Freq: Once | INTRAMUSCULAR | Status: AC
  Administered 2023-12-12: 60 mg via INTRAMUSCULAR

## 2023-12-12 MED ORDER — CYCLOBENZAPRINE HCL 10 MG PO TABS: 10.0000 mg | ORAL_TABLET | Freq: Every day | ORAL | 3 refills | Status: AC

## 2023-12-12 MED ORDER — ZAVZPRET 10 MG/ACT NA SOLN: 1.0000 | Freq: Every day | NASAL | 0 refills | Status: AC | PRN

## 2023-12-12 MED ORDER — QULIPTA 60 MG PO TABS: 60.0000 mg | ORAL_TABLET | Freq: Every evening | ORAL | Status: DC

## 2023-12-12 NOTE — Progress Notes (Unsigned)
 Per Dr Ines give 60 mg toradol  injection Gave injection placed bandade on injection site . Pt went to lab for blood work

## 2023-12-12 NOTE — Progress Notes (Unsigned)
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 Neurologic Exam  Speech:    Speech is normal; fluent and spontaneous with normal comprehension.  Cognition:    The patient is oriented to person, place, and time;     recent and remote memory intact;     language fluent;     normal attention, concentration,     fund of knowledge Cranial Nerves:    The pupils are equal, round, and reactive to light. The fundi are normal and spontaneous venous pulsations are present. Visual fields are full to finger confrontation. Extraocular movements are intact. Trigeminal sensation is intact and the muscles of mastication are normal. The face is symmetric. The palate elevates in the midline. Hearing intact. Voice is normal. Shoulder shrug is normal. The tongue has normal motion without fasciculations.   Coordination: Normal   Gait: Normal   Motor Observation:    No asymmetry, no atrophy, and no involuntary movements noted. Tone:    Normal muscle tone.    Posture:    Posture is normal.  normal erect    Strength:    Strength is V/V in the upper and lower limbs.      Sensation: intact to LT     Reflex Exam:  DTR's:    Deep tendon reflexes in the upper and lower extremities are normal bilaterally.   Toes:    The toes are downgoing bilaterally.   Clonus:    Clonus is absent.       Assessment/Plan:  This is a patient we have been seeing for several years, she gets botox  every 3 months for her migraines, she has an extensive history of multiple medical problems including multiple chronic pain conditions including migraines, fibromyalgia, chronic back pain with radiculopathy, chronic pain syndrome, bipolar disorder she also has obstructive sleep apnea, hypertension, GERD, constipation, type 2 diabetes, Graves' disease, anxiety, major depressive disorder, status post gastric bypass, posttraumatic stress disorder, panic disorder, symptoms and signs involving cognitive function and awareness, neck pain, multiple neurologic symptoms, long-term use of opiate analgesic, insomnia, today she is here for worsening migraines and she has been in a plethora of medications for her migraines.   Physical Therapy: Cervical myofascial pain, forward posture contributing to migraines and cervicalgia. Please evaluate and treat including dry needling, stretching, strengthening, manual therapy/massage, heating, TENS unit, exercising for scapular stabilization, pectoral stretching and rhomboid strengthening as clinically warranted as well as any other modality as recommended by evaluation. [place] especially for dry needling.  Rebotox - samples, scheduled Start Qulipta  and if you feel better than the emgality  we can switch (samples for 30 days) and will oder it Flexeril  at bedtime Nasal spray zavzpret  at onset of migraines samples for now  PLAN: Qulipta  daily at bedtime Zavzpret  as needed twice daily for migraine - samples, if improved can prescribe Flexeril  at bedtime   From a thorough  review of records and patient report, Medications tried that can be used in migraine/headache management greater than 3 months include: Lifestyle modification, headache diaries, better sleep hygiene, exercise, management  of migraine triggers, OTC and prescribed analgesics/nsaids such as ibuprofen , excedrin, alleve and others, Aimovig  contraindicated due to constipation and has been on Aimovig , amitriptyline , Abilify , atenolol , Botox , candesartan , Tegretol , Celexa , Flexeril , Benadryl ,Depakote , Cymbalta , Emgality , Emgality ,Lexapro , etodolac , Ajovy , gabapentin , ketorolac , Lamictal , meclizine , meloxicam, Robaxin , methylprednisolone , Reglan , Paxil , Compazine , Seroquel , Nurtec, Risperdal , Imitrex , Maxalt, Topamax , tramadol , verapamil  Meds ordered this encounter  Medications   cyclobenzaprine  (FLEXERIL ) 10 MG tablet    Sig: Take 1 tablet (10 mg total) by mouth at bedtime.    Dispense:  30 tablet    Refill:  3   DISCONTD: Atogepant  (QULIPTA ) 60 MG TABS    Sig: Take 1 tablet (60 mg total) by mouth at bedtime.   Zavegepant HCl (ZAVZPRET ) 10 MG/ACT SOLN    Sig: Place 1 spray into the nose daily as needed.    Dispense:  6 each    Refill:  0   ketorolac  (TORADOL ) injection 60 mg   Atogepant  (QULIPTA ) 60 MG TABS    Sig: Take 1 tablet (60 mg total) by mouth at bedtime.    Dispense:  30 tablet    Refill:  11    Orders Placed This Encounter  Procedures   Sedimentation rate   C-reactive protein   Ambulatory referral to Physical Therapy     Onetha Epp, MD  [facility] [address] [place], [place] [address]  Phone [phone] Fax [phone]  I spent 51 minutes of face-to-face and non-face-to-face time with patient on the  1. Chronic migraine without aura without status migrainosus, not intractable   2. Acute nonintractable headache, unspecified headache type   3. Cervical myofascial pain syndrome   4. Cervicalgia    diagnosis.  This included previsit  chart review, lab review, study review, order entry, electronic health record documentation, patient education on the different diagnostic and therapeutic options, counseling and coordination of care, risks and benefits of management, compliance, or risk factor reduction

## 2023-12-12 NOTE — Patient Instructions (Addendum)
 Physical Therapy: Cervical myofascial pain, forward posture contributing to migraines and cervicalgia. Please evaluate and treat including dry needling, stretching, strengthening, manual therapy/massage, heating, TENS unit, exercising for scapular stabilization, pectoral stretching and rhomboid strengthening as clinically warranted as well as any other modality as recommended by evaluation. [place] especially for dry needling.  Rebotox - samples Start Qulipta  and if you feel better than the emgality  we can switch (samples for 30 days) and will oder it Flexeril  at bedtime Nasal spray zavzpret  at onse of migraines   PLAN: Qulipta  daily at bedtime Zavzpret  as needed twice daily for migraine Flexeril  at bedtime   Zavegepant Nasal Spray What is this medication? ZAVEGEPANT (za VE je pant) treats migraines. It works by blocking a substance in the body that causes migraines. It is not used to prevent migraines. This medicine may be used for other purposes; ask your health care provider or pharmacist if you have questions. COMMON BRAND NAME(S): ZAVZPRET  What should I tell my care team before I take this medication? They need to know if you have any of these conditions: Kidney disease Liver disease An unusual or allergic reaction to zavegepant, other medications, foods, dyes, or preservatives Pregnant or trying to get pregnant Breast-feeding How should I use this medication? This medication is for use in the nose. Take it as directed on the prescription label. Do not use it more often than directed. Make sure that you are using your nasal spray correctly. Ask your care team if you have any questions. Talk to your care team about the use of this medication in children. Special care may be needed. Overdosage: If you think you have taken too much of this medicine contact a poison control center or emergency room at once. NOTE: This medicine is only for you. Do not share this medicine with  others. What if I miss a dose? This does not apply. This medication is not for regular use. It should only be used as needed. What may interact with this medication? Decongestant nasal sprays This medication may affect how other medications work, and other medications may affect the way this medication works. Talk with your care team about all of the medications you take. They may suggest changes to your treatment plan to lower the risk of side effects and to make sure your medications work as intended. This list may not describe all possible interactions. Give your health care provider a list of all the medicines, herbs, non-prescription drugs, or dietary supplements you use. Also tell them if you smoke, drink alcohol, or use illegal drugs. Some items may interact with your medicine. What should I watch for while using this medication? Visit your care team for regular checks on your progress. Tell your care team if your symptoms do not start to get better or if they get worse. What side effects may I notice from receiving this medication? Side effects that you should report to your care team as soon as possible: Allergic reactions--skin rash, itching, hives, swelling of the face, lips, tongue, or throat Side effects that usually do not require medical attention (report to your care team if they continue or are bothersome): Change in taste Dryness or irritation inside the nose Nausea Vomiting This list may not describe all possible side effects. Call your doctor for medical advice about side effects. You may report side effects to FDA at 1-800-FDA-1088. Where should I keep my medication? Keep out of the reach of children and pets. Store at room temperature between 20  and 25 degrees C (68 and 77 degrees F). Do not freeze. Get rid of any unused medication after the expiration date. To get rid of medications that are no longer needed or have expired: Take the medication to a medication take-back  program. Check with your pharmacy or law enforcement to find a location. If you cannot return the medication, ask your pharmacist or care team how to get rid of this medication safely. NOTE: This sheet is a summary. It may not cover all possible information. If you have questions about this medicine, talk to your doctor, pharmacist, or health care provider.  2024 Elsevier/Gold Standard (2021-08-06 00:00:00)Atogepant  Tablets What is this medication? ATOGEPANT  (a TOE je pant) prevents migraines. It works by blocking a substance in the body that causes migraines. This medicine may be used for other purposes; ask your health care provider or pharmacist if you have questions. COMMON BRAND NAME(S): QULIPTA  What should I tell my care team before I take this medication? They need to know if you have any of these conditions: Kidney disease Liver disease An unusual or allergic reaction to atogepant , other medications, foods, dyes, or preservatives Pregnant or trying to get pregnant Breast-feeding How should I use this medication? Take this medication by mouth with water. Take it as directed on the prescription label at the same time every day. You can take it with or without food. If it upsets your stomach, take it with food. Keep taking it unless your care team tells you to stop. Talk to your care team about the use of this medication in children. Special care may be needed. Overdosage: If you think you have taken too much of this medicine contact a poison control center or emergency room at once. NOTE: This medicine is only for you. Do not share this medicine with others. What if I miss a dose? If you miss a dose, take it as soon as you can. If it is almost time for your next dose, take only that dose. Do not take double or extra doses. What may interact with this medication? Carbamazepine  Certain medications for fungal infections, such as itraconazole,  ketoconazole Clarithromycin Cyclosporine Efavirenz Etravirine Phenytoin Rifampin St. John's wort This list may not describe all possible interactions. Give your health care provider a list of all the medicines, herbs, non-prescription drugs, or dietary supplements you use. Also tell them if you smoke, drink alcohol, or use illegal drugs. Some items may interact with your medicine. What should I watch for while using this medication? Visit your care team for regular checks on your progress. Tell your care team if your symptoms do not start to get better or if they get worse. What side effects may I notice from receiving this medication? Side effects that you should report to your care team as soon as possible: Allergic reactions--skin rash, itching, hives, swelling of the face, lips, tongue, or throat Side effects that usually do not require medical attention (report to your care team if they continue or are bothersome): Constipation Fatigue Loss of appetite with weight loss Nausea This list may not describe all possible side effects. Call your doctor for medical advice about side effects. You may report side effects to FDA at 1-800-FDA-1088. Where should I keep my medication? Keep out of the reach of children and pets. Store at room temperature between 20 and 25 degrees C (68 and 77 degrees F). Get rid of any unused medication after the expiration date. To get rid of medications that are  no longer needed or have expired: Take the medication to a medication take-back program. Check with your pharmacy or law enforcement to find a location. If you cannot return the medication, check the label or package insert to see if the medication should be thrown out in the garbage or flushed down the toilet. If you are not sure, ask your care team. If it is safe to put it in the trash, take the medication out of the container. Mix the medication with cat litter, dirt, coffee grounds, or other unwanted  substance. Seal the mixture in a bag or container. Put it in the trash. NOTE: This sheet is a summary. It may not cover all possible information. If you have questions about this medicine, talk to your doctor, pharmacist, or health care provider.  2024 Elsevier/Gold Standard (2021-06-29 00:00:00)  Cyclobenzaprine  Tablets What is this medication? CYCLOBENZAPRINE  (sye kloe BEN za preen) treats muscle spasms. It works by relaxing your muscles, which reduces muscle stiffness. It belongs to a group of medications called muscle relaxants. This medicine may be used for other purposes; ask your health care provider or pharmacist if you have questions. COMMON BRAND NAME(S): Fexmid , Flexeril  What should I tell my care team before I take this medication? They need to know if you have any of these conditions: Heart disease, irregular heartbeat, or previous heart attack Liver disease Thyroid  problem An unusual or allergic reaction to cyclobenzaprine , tricyclic antidepressants, lactose, other medications, foods, dyes, or preservatives Pregnant or trying to get pregnant Breastfeeding How should I use this medication? Take this medication by mouth with a glass of water. Take it as directed on the prescription label. You can take it with or without food. If it upsets your stomach, take it with food. Do not take it more often than directed. Talk to your care team about the use of this medication in children. Special care may be needed. Overdosage: If you think you have taken too much of this medicine contact a poison control center or emergency room at once. NOTE: This medicine is only for you. Do not share this medicine with others. What if I miss a dose? If you miss a dose, take it as soon as you can. If it is almost time for your next dose, take only that dose. Do not take double or extra doses. What may interact with this medication? Do not take this medication with any of the following: MAOIs, such as  Carbex, Eldepryl, Marplan, Nardil, and Parnate Opioid medications for cough Safinamide This medication may also interact with the following: Alcohol Bupropion  Antihistamines for allergy, cough, and cold Certain medications for anxiety or sleep Certain medications for bladder problems, such as oxybutynin, tolterodine Certain medications for depression, such as amitriptyline , fluoxetine, sertraline Certain medications for Parkinson disease, such as benztropine, trihexyphenidyl Certain medications for seizures, such as phenobarbital, primidone Certain medications for stomach problems, such as dicyclomine, hyoscyamine Certain medications for travel sickness, such as scopolamine General anesthetics, such as halothane, isoflurane, methoxyflurane, propofol  Ipratropium Local anesthetics, such as lidocaine , pramoxine, tetracaine Medications that relax muscles for surgery Opioid medications for pain Phenothiazines, such as chlorpromazine, mesoridazine, prochlorperazine , thioridazine Verapamil This list may not describe all possible interactions. Give your health care provider a list of all the medicines, herbs, non-prescription drugs, or dietary supplements you use. Also tell them if you smoke, drink alcohol, or use illegal drugs. Some items may interact with your medicine. What should I watch for while using this medication? Visit your care team for  regular checks on your progress. Tell your care team if your symptoms do not start to get better or if they get worse. This medication may affect your coordination, reaction time, or judgment. Do not drive or operate machinery until you know how this medication affects you. Sit up or stand slowly to reduce the risk of dizzy or fainting spells. Drinking alcohol with this medication can increase the risk of these side effects. Taking this medication with other substances that cause drowsiness, such as alcohol, benzodiazepines, or other opioids can cause  serious side effects. Give your care team a list of all medications you use. They will tell you how much medication to take. Do not take more medication than directed. Call emergency services if you have problems breathing or staying awake. Your mouth may get dry. Chewing sugarless gum or sucking hard candy and drinking plenty of water may help. Contact your care team if the problem does not go away or is severe. What side effects may I notice from receiving this medication? Side effects that you should report to your care team as soon as possible: Allergic reactions--skin rash, itching, hives, swelling of the face, lips, tongue, or throat CNS depression--slow or shallow breathing, shortness of breath, feeling faint, dizziness, confusion, trouble staying awake Heart rhythm changes--fast or irregular heartbeat, dizziness, feeling faint or lightheaded, chest pain, trouble breathing Side effects that usually do not require medical attention (report to your care team if they continue or are bothersome): Constipation Dizziness Drowsiness Dry mouth Fatigue Nausea This list may not describe all possible side effects. Call your doctor for medical advice about side effects. You may report side effects to FDA at 1-800-FDA-1088. Where should I keep my medication? Keep out of the reach of children and pets. Store at room temperature between 20 and 25 degrees C (68 and 77 degrees F). Keep container tightly closed. Get rid of any unused medication after the expiration date. To get rid of medications that are no longer needed or have expired: Take the medication to a medication take-back program. Check with your pharmacy or law enforcement to find a location. If you cannot return the medication, check the label or package insert to see if the medication should be thrown out in the garbage or flushed down the toilet. If you are not sure, ask your care team. If it is safe to put it in the trash, empty the  medication out of the container. Mix the medication with cat litter, dirt, coffee grounds, or other unwanted substance. Seal the mixture in a bag or container. Put it in the trash. NOTE: This sheet is a summary. It may not cover all possible information. If you have questions about this medicine, talk to your doctor, pharmacist, or health care provider.  2024 Elsevier/Gold Standard (2021-11-13 00:00:00)

## 2023-12-13 ENCOUNTER — Telehealth: Payer: Self-pay | Admitting: Adult Health

## 2023-12-13 ENCOUNTER — Ambulatory Visit: Payer: Self-pay | Admitting: Neurology

## 2023-12-13 DIAGNOSIS — M7918 Myalgia, other site: Secondary | ICD-10-CM | POA: Insufficient documentation

## 2023-12-13 LAB — SEDIMENTATION RATE: Sed Rate: 8 mm/h (ref 0–40)

## 2023-12-13 LAB — C-REACTIVE PROTEIN: CRP: <1 mg/L (ref 0–10)

## 2023-12-13 MED ORDER — QULIPTA 60 MG PO TABS: 60.0000 mg | ORAL_TABLET | Freq: Every evening | ORAL | 11 refills | Status: DC

## 2023-12-13 NOTE — Telephone Encounter (Signed)
 Robin from [facility] called to Scheduled Pt Botox  Delivery   Botox  delivery Delivery date :July 24,2025 Quantity of 1 injection  200 units for 84 day supply

## 2023-12-13 NOTE — Telephone Encounter (Signed)
Updated appt note

## 2023-12-14 ENCOUNTER — Telehealth: Payer: Self-pay | Admitting: Pharmacist

## 2023-12-14 NOTE — Telephone Encounter (Signed)
 Pharmacy Patient Advocate Encounter   Received notification from Patient Pharmacy that prior authorization for Qulipta  60MG  tablets is required/requested.   Insurance verification completed.   The patient is insured through [facility] .   Per test claim: PA required; PA submitted to above mentioned insurance via CoverMyMeds Key/confirmation #/EOC [id number] Status is pending

## 2023-12-14 NOTE — Telephone Encounter (Signed)
 Pharmacy Patient Advocate Encounter  Received notification from [employer] that Prior Authorization for Qulipta  60MG  tablets has been APPROVED from 12/14/2023 to 05/23/2024   PA #/Case ID/Reference #: [id number]

## 2023-12-25 ENCOUNTER — Other Ambulatory Visit: Payer: Self-pay | Admitting: Neurology

## 2023-12-26 ENCOUNTER — Telehealth: Payer: Self-pay | Admitting: Neurology

## 2023-12-26 NOTE — Telephone Encounter (Signed)
 R/s due to not feeling well

## 2023-12-27 ENCOUNTER — Ambulatory Visit: Admitting: Adult Health

## 2023-12-28 NOTE — Therapy (Incomplete)
 OUTPATIENT PHYSICAL THERAPY CERVICAL EVALUATION   Patient Name: [name] MRN: [id number] DOB:07/13/1965, 58 y.o., female Today's Date: 12/28/2023  END OF SESSION:   Past Medical History:  Diagnosis Date   Acute kidney injury (HCC) 10/25/2020   Acute respiratory disease due to COVID-19 virus 10/25/2020   AKI (acute kidney injury) (HCC) 10/25/2020   Allergic rhinitis    Allergic to cats    pet dander   Allergy    Anemia    Anxiety    Arthritis 09/2018   both feet   Asthma    Back pain    Bipolar disorder (HCC)    Cervicalgia    Chronic fatigue syndrome    Chronic low back pain with left-sided sciatica    Class 3 severe obesity with serious comorbidity and body mass index (BMI) of 40.0 to 44.9 in adult 11/14/2014   Constipation    Depression    Diabetes mellitus without complication (HCC)    pre   Drug overdose, multiple drugs 11/01/2012   Dyspnea    Environmental allergies    Fibromyalgia    GERD (gastroesophageal reflux disease)    Grave's disease    Heart murmur    High cholesterol    History of blood clots    Hypertension    Joint pain    Lactose intolerance    Lower extremity edema    Multiple food allergies    OSA (obstructive sleep apnea)    Osteoporosis    Palpitations    Panic disorder    PE (pulmonary thromboembolism) (HCC)    on Eliquis    Pharyngoesophageal dysphagia 06/24/2014   Prediabetes 05/11/2019   Protein-calorie malnutrition, mild (HCC) 06/23/2021   Pulmonary embolism (HCC) 2024   Rheumatoid arthritis (HCC)    Risk for falls    Sciatica    Severe 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 (HCC)    Sleep apnea    no cpap worn   Subclinical hypothyroidism 05/11/2019   Syncope    Syncope and collapse    Thyroid  disease    Vasovagal syncope    Vertigo    Vitamin D  deficiency    Past Surgical History:  Procedure Laterality Date   ABDOMINAL HYSTERECTOMY  2006   BREAST BIOPSY Left 05/30/2023   MM LT BREAST BX W  LOC DEV 1ST LESION IMAGE BX SPEC STEREO GUIDE 05/30/2023 [facility]   CHOLECYSTECTOMY N/A 10/04/2022   Procedure: LAPAROSCOPIC CHOLECYSTECTOMY WITH INTRAOPERATIVE CHOLANGIOGRAM;  Surgeon: Belinda Cough, MD;  Location: [facility] OR;  Service: General;  Laterality: N/A;   COLONOSCOPY     GASTRIC BYPASS  2008   Patient Active Problem List   Diagnosis Date Noted   Cervical myofascial pain syndrome 12/13/2023   Excess skin 11/30/2023   Goiter 11/30/2023   Localized obesity 11/30/2023   Mood altered 11/30/2023   Polyphagia 11/30/2023   Chronic pain syndrome 11/30/2023   Pain 11/30/2023   Drug-induced constipation 11/30/2023   Long term (current) use of opiate analgesic 11/30/2023   Lumbosacral spondylosis with radiculopathy 11/30/2023   Rib pain on left side 11/30/2023   Bipolar disorder, current episode depressed, moderate (HCC) 05/06/2023   Post-traumatic stress disorder, chronic 05/06/2023   Cholecystitis, acute with cholelithiasis 10/03/2022   Choledocholithiasis 10/02/2022   Acute left ankle pain 06/02/2022   Pulmonary embolism (HCC) 06/01/2022   History of pulmonary embolism 06/01/2022   Acute pain of left foot 05/27/2022   Type 2 diabetes mellitus without complications (HCC) 05/26/2022   Anemia 05/26/2022  Urinary retention 05/26/2022   Syncope and collapse 05/25/2022   Lumbosacral radiculopathy at L5 01/28/2022   Lumbosacral radiculopathy at S1 01/28/2022   Multiple neurological symptoms 06/23/2021   Metabolic syndrome 10/21/2020   Constipation 07/09/2020   Subclinical hypothyroidism 05/11/2019   Prediabetes 05/11/2019   Vitamin D  deficiency 05/11/2019   OSA (obstructive sleep apnea) 05/11/2019   Other symptoms and signs involving cognitive functions and awareness 02/27/2019   Intractable chronic migraine without aura and with status migrainosus 01/14/2016   Risk for falls 09/02/2015   Insomnia 05/27/2015   Bipolar II disorder (HCC) 02/25/2015   Bipolar disorder (HCC)  02/25/2015   Obesity due to excess calories 11/14/2014   Severe 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 (HCC) 10/10/2014   Generalized anxiety disorder 10/10/2014   Panic disorder with agoraphobia 10/10/2014   Social anxiety disorder 10/10/2014   Anxiety, generalized 10/10/2014   Asthma 08/01/2014   Depressive disorder 08/01/2014   Low back pain, unspecified 07/24/2014   Encounter for monitoring opioid maintenance therapy 07/02/2014   Neck pain 07/02/2014   Gastroesophageal reflux disease without esophagitis 06/24/2014   Pharyngoesophageal dysphagia 06/24/2014   S/P gastric bypass 06/24/2014   Hx of recurrent syncope 05/24/2013   Fibromyalgia 08/01/2012   Migraine 05/22/2012   Panic disorder 05/22/2012   Depression 11/22/2011   Other chronic pain 11/22/2011   Graves' disease    Need for prophylactic postmenopausal hormone replacement therapy 11/03/2011   Allergic rhinitis 09/28/2011   Essential hypertension 09/28/2011   Memory loss 09/28/2011    PCP: Joshua Santana CROME, NP   REFERRING PROVIDER: Ines Onetha NOVAK, MD  REFERRING DIAG: [phone] (ICD-10-CM) - Chronic migraine without aura without status migrainosus, not intractable M79.18 (ICD-10-CM) - Cervical myofascial pain syndrome M54.2 (ICD-10-CM) - Cervicalgia  THERAPY DIAG:  No diagnosis found.  Rationale for Evaluation and Treatment: Rehabilitation  ONSET DATE: ***  SUBJECTIVE:                                                                                                                                                                                                         SUBJECTIVE STATEMENT: *** Hand dominance: {MISC; OT HAND DOMINANCE:[phone]}  PERTINENT HISTORY:  Anemia, bipolar disorder, chronic fatigue syndrome, depression, DM, fibromyalgia, HLD, HTN, osteoporosis, panic disorder, hx PE, RA, syncope  PAIN:  Are you having pain? {OPRCPAIN:27236}  PRECAUTIONS: {Therapy  precautions:24002}  RED FLAGS: {PT Red Flags:29287}     WEIGHT BEARING RESTRICTIONS: {Yes ***/No:24003}  FALLS:  Has patient fallen in last 6 months? {fallsyesno:27318}  LIVING ENVIRONMENT: Lives with: {OPRC lives  with:25569::lives with their family} Lives in: {Lives in:25570} Stairs: {opstairs:27293} Has following equipment at home: {Assistive devices:23999}  OCCUPATION: ***  PLOF: {PLOF:24004}  PATIENT GOALS: ***  NEXT MD VISIT: ***  OBJECTIVE:  Note: Objective measures were completed at Evaluation unless otherwise noted.  DIAGNOSTIC FINDINGS:  12/01/23 head CT: No acute intracranial abnormality, specifically, no acute hemorrhage, territorial infarction, or intracranial mass.   10/28/23 cervical CT: No acute intracranial abnormality. 2. No acute fracture in the cervical spine.  PATIENT SURVEYS:  NDI:  NECK DISABILITY INDEX  Date: *** Score  Pain intensity {NDI-1:32931}  2. Personal care (washing, dressing, etc.) {NDI-2:32932}  3. Lifting {NDI-3:32933}  4. Reading {NDI-4:32934}  5. Headaches {NDI-5:32935}  6. Concentration {NDI-6:32936}  7. Work {NDI-7:32937}  8. Driving {WIP-1:67061}  9. Sleeping {NDI-9:32939}  10. Recreation {NDI-10:32940}  Total ***/50   Minimum Detectable Change (90% confidence): 5 points or 10% points  COGNITION: Overall cognitive status: {cognition:24006}  SENSATION: {sensation:27233}  POSTURE: {posture:25561}  PALPATION: ***   CERVICAL ROM:   Active ROM A/PROM (deg) eval  Flexion   Extension   Right lateral flexion   Left lateral flexion   Right rotation   Left rotation    (Blank rows = not tested)  UPPER EXTREMITY ROM:  Active ROM Right eval Left eval  Shoulder flexion    Shoulder extension    Shoulder abduction    Shoulder adduction    Shoulder extension    Shoulder internal rotation    Shoulder external rotation    Elbow flexion    Elbow extension    Wrist flexion    Wrist extension    Wrist ulnar  deviation    Wrist radial deviation    Wrist pronation    Wrist supination     (Blank rows = not tested)  UPPER EXTREMITY MMT:  MMT Right eval Left eval  Shoulder flexion    Shoulder extension    Shoulder abduction    Shoulder adduction    Shoulder extension    Shoulder internal rotation    Shoulder external rotation    Middle trapezius    Lower trapezius    Elbow flexion    Elbow extension    Wrist flexion    Wrist extension    Wrist ulnar deviation    Wrist radial deviation    Wrist pronation    Wrist supination    Grip strength     (Blank rows = not tested)  CERVICAL SPECIAL TESTS:  {Cervical special tests:25246}  FUNCTIONAL TESTS:  {Functional tests:24029}  TREATMENT DATE: ***                                                                                                                                 PATIENT EDUCATION:  Education details: *** Person educated: {Person educated:25204} Education method: {Education Method:25205} Education comprehension: {Education Comprehension:25206}  HOME EXERCISE PROGRAM: ***  ASSESSMENT:  CLINICAL IMPRESSION:  Patient is a 58 y/o  F presenting to OPPT with c/o *** for the past ***  Patient today presenting with ***.    Patient was educated on gentle *** HEP and reported understanding. Prior to current episode, patient was independent. Would benefit from skilled PT services *** x/week for *** weeks to address aforementioned impairments in order to optimize level of function.    OBJECTIVE IMPAIRMENTS: {opptimpairments:25111}.   ACTIVITY LIMITATIONS: {activitylimitations:27494}  PARTICIPATION LIMITATIONS: {participationrestrictions:25113}  PERSONAL FACTORS: {Personal factors:25162} are also affecting patient's functional outcome.   REHAB POTENTIAL: {rehabpotential:25112}  CLINICAL DECISION MAKING: {clinical decision making:25114}  EVALUATION COMPLEXITY: {Evaluation complexity:25115}    GOALS: Goals  reviewed with patient? Yes  SHORT TERM GOALS: Target date: {follow up:25551}  Patient to be independent with initial HEP. Baseline: HEP initiated Goal status: {GOALSTATUS:25110}    LONG TERM GOALS: Target date: {follow up:25551}  Patient to be independent with advanced HEP. Baseline: Not yet initiated  Goal status: {GOALSTATUS:25110}  Patient to demonstrate B LE strength >/=4+/5.  Baseline: See above Goal status: {GOALSTATUS:25110}  Patient to demonstrate *** ROM WFL and without pain limiting.  Baseline: *** Goal status: {GOALSTATUS:25110}  Patient to report and demonstrate improved head, neck, and shoulder posture at rest and with activity.  Baseline: *** Goal status: {GOALSTATUS:25110}  Patient to demonstrate alternating reciprocal pattern when ascending and descending stairs with good stability and 1 handrail as needed.   Baseline: Unable Goal status: {GOALSTATUS:25110}  Patient to score at least 20/24 on DGI in order to decrease risk of falls.  Baseline: *** Goal status: {GOALSTATUS:25110}  Patient to complete TUG in <14 sec with LRAD in order to decrease risk of falls.   Baseline: *** Goal status: {GOALSTATUS:25110}  Patient to demonstrate 5xSTS test in <15 sec in order to decrease risk of falls.  Baseline: *** Goal status: {GOALSTATUS:25110}  Patient to score at least ***/56 on Berg in order to decrease risk of falls.  Baseline: *** Goal status: {GOALSTATUS:25110}  Patient to score at least *** on FOTO in order to indicate improved functional outcomes.  Baseline: *** Goal status: {GOALSTATUS:25110}   PLAN:  PT FREQUENCY: {rehab frequency:25116}  PT DURATION: {rehab duration:25117}  PLANNED INTERVENTIONS: {rehab planned interventions:25118::97110-Therapeutic exercises,97530- Therapeutic [id number]- Neuromuscular re-education,97535- Self Rjmz,02859- Manual therapy}  PLAN FOR NEXT SESSION: ***   Slater MARLA Christians, PT 12/28/2023, 9:20  AM

## 2023-12-28 NOTE — Telephone Encounter (Signed)
 Note on last refill says to see psychiatry for further refills. Dr Ines has not historically prescribed this for the patient.

## 2023-12-29 ENCOUNTER — Ambulatory Visit: Admitting: Physical Therapy

## 2023-12-30 NOTE — Therapy (Signed)
 OUTPATIENT PHYSICAL THERAPY CERVICAL EVALUATION   Patient Name: [name] MRN: [id number] DOB:06-May-1966, 58 y.o., female Today's Date: 01/02/2024  END OF SESSION:  PT End of Session - 01/02/24 1657     Visit Number 1    Number of Visits 17    Date for PT Re-Evaluation 02/27/24    Authorization Type [employer]    Authorization Time Period auth submitted    PT Start Time 1538    PT Stop Time 1615    PT Time Calculation (min) 37 min    Activity Tolerance Patient tolerated treatment well;Patient limited by pain    Behavior During Therapy [facility] for tasks assessed/performed          Past Medical History:  Diagnosis Date   Acute kidney injury (HCC) 10/25/2020   Acute respiratory disease due to COVID-19 virus 10/25/2020   AKI (acute kidney injury) (HCC) 10/25/2020   Allergic rhinitis    Allergic to cats    pet dander   Allergy    Anemia    Anxiety    Arthritis 09/2018   both feet   Asthma    Back pain    Bipolar disorder (HCC)    Cervicalgia    Chronic fatigue syndrome    Chronic low back pain with left-sided sciatica    Class 3 severe obesity with serious comorbidity and body mass index (BMI) of 40.0 to 44.9 in adult 11/14/2014   Constipation    Depression    Diabetes mellitus without complication (HCC)    pre   Drug overdose, multiple drugs 11/01/2012   Dyspnea    Environmental allergies    Fibromyalgia    GERD (gastroesophageal reflux disease)    Grave's disease    Heart murmur    High cholesterol    History of blood clots    Hypertension    Joint pain    Lactose intolerance    Lower extremity edema    Multiple food allergies    OSA (obstructive sleep apnea)    Osteoporosis    Palpitations    Panic disorder    PE (pulmonary thromboembolism) (HCC)    on Eliquis    Pharyngoesophageal dysphagia 06/24/2014   Prediabetes 05/11/2019   Protein-calorie malnutrition, mild (HCC) 06/23/2021   Pulmonary embolism (HCC) 2024   Rheumatoid arthritis (HCC)     Risk for falls    Sciatica    Severe 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 (HCC)    Sleep apnea    no cpap worn   Subclinical hypothyroidism 05/11/2019   Syncope    Syncope and collapse    Thyroid  disease    Vasovagal syncope    Vertigo    Vitamin D  deficiency    Past Surgical History:  Procedure Laterality Date   ABDOMINAL HYSTERECTOMY  2006   BREAST BIOPSY Left 05/30/2023   MM LT BREAST BX W LOC DEV 1ST LESION IMAGE BX SPEC STEREO GUIDE 05/30/2023 [facility]   CHOLECYSTECTOMY N/A 10/04/2022   Procedure: LAPAROSCOPIC CHOLECYSTECTOMY WITH INTRAOPERATIVE CHOLANGIOGRAM;  Surgeon: Belinda Cough, MD;  Location: [facility] OR;  Service: General;  Laterality: N/A;   COLONOSCOPY     GASTRIC BYPASS  2008   Patient Active Problem List   Diagnosis Date Noted   Cervical myofascial pain syndrome 12/13/2023   Excess skin 11/30/2023   Goiter 11/30/2023   Localized obesity 11/30/2023   Mood altered 11/30/2023   Polyphagia 11/30/2023   Chronic pain syndrome 11/30/2023   Pain 11/30/2023   Drug-induced  constipation 11/30/2023   Long term (current) use of opiate analgesic 11/30/2023   Lumbosacral spondylosis with radiculopathy 11/30/2023   Rib pain on left side 11/30/2023   Bipolar disorder, current episode depressed, moderate (HCC) 05/06/2023   Post-traumatic stress disorder, chronic 05/06/2023   Cholecystitis, acute with cholelithiasis 10/03/2022   Choledocholithiasis 10/02/2022   Acute left ankle pain 06/02/2022   Pulmonary embolism (HCC) 06/01/2022   History of pulmonary embolism 06/01/2022   Acute pain of left foot 05/27/2022   Type 2 diabetes mellitus without complications (HCC) 05/26/2022   Anemia 05/26/2022   Urinary retention 05/26/2022   Syncope and collapse 05/25/2022   Lumbosacral radiculopathy at L5 01/28/2022   Lumbosacral radiculopathy at S1 01/28/2022   Multiple neurological symptoms 06/23/2021   Metabolic syndrome 10/21/2020   Constipation  07/09/2020   Subclinical hypothyroidism 05/11/2019   Prediabetes 05/11/2019   Vitamin D  deficiency 05/11/2019   OSA (obstructive sleep apnea) 05/11/2019   Other symptoms and signs involving cognitive functions and awareness 02/27/2019   Intractable chronic migraine without aura and with status migrainosus 01/14/2016   Risk for falls 09/02/2015   Insomnia 05/27/2015   Bipolar II disorder (HCC) 02/25/2015   Bipolar disorder (HCC) 02/25/2015   Obesity due to excess calories 11/14/2014   Severe 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 (HCC) 10/10/2014   Generalized anxiety disorder 10/10/2014   Panic disorder with agoraphobia 10/10/2014   Social anxiety disorder 10/10/2014   Anxiety, generalized 10/10/2014   Asthma 08/01/2014   Depressive disorder 08/01/2014   Low back pain, unspecified 07/24/2014   Encounter for monitoring opioid maintenance therapy 07/02/2014   Neck pain 07/02/2014   Gastroesophageal reflux disease without esophagitis 06/24/2014   Pharyngoesophageal dysphagia 06/24/2014   S/P gastric bypass 06/24/2014   Hx of recurrent syncope 05/24/2013   Fibromyalgia 08/01/2012   Migraine 05/22/2012   Panic disorder 05/22/2012   Depression 11/22/2011   Other chronic pain 11/22/2011   Graves' disease    Need for prophylactic postmenopausal hormone replacement therapy 11/03/2011   Allergic rhinitis 09/28/2011   Essential hypertension 09/28/2011   Memory loss 09/28/2011    PCP: Joshua Santana CROME, NP   REFERRING PROVIDER: Ines Onetha NOVAK, MD  REFERRING DIAG: [phone] (ICD-10-CM) - Chronic migraine without aura without status migrainosus, not intractable M79.18 (ICD-10-CM) - Cervical myofascial pain syndrome M54.2 (ICD-10-CM) - Cervicalgia  THERAPY DIAG:  Cervicalgia  Abnormal posture  Repeated falls  Rationale for Evaluation and Treatment: Rehabilitation  ONSET DATE: since   SUBJECTIVE:  SUBJECTIVE STATEMENT: Patient reports 2 migraines per week. Reports that her last migraine lasted 19 days. Currently suffering a migraine as well. Reports that during a migraine she also has neck pain. Patient reports that pain sometimes radiates down the R UE with tingling but not numbness. Reports that massages have helped in the past. Reports that she suspects her glasses are contributing to migraines. Reports that she also gets dizziness, mostly when she has a migraine but not always. Reports dizziness when she moves quickly. Reports that her BP runs low. Patient reports that she tends to fall a lot, especially when getting in and out of her garden tub and cannot get up from the floor.. Reports that she has tried a tub seat but it is not feasible d/t her toilet being so close to her tub.   Hand dominance: Right  PERTINENT HISTORY:  Anemia, bipolar disorder, chronic fatigue syndrome, depression, DM, fibromyalgia, HLD, HTN, osteoporosis, panic disorder, hx PE, RA, syncope  PAIN:  Are you having pain? Yes: NPRS scale: 8/10 Pain location: across forehead Pain description: headache Aggravating factors: bright lights Relieving factors: meds  PRECAUTIONS: Fall  RED FLAGS: None     WEIGHT BEARING RESTRICTIONS: No  FALLS:  Has patient fallen in last 6 months? Yes. Number of falls pt reports that she falls 2x/weeks  LIVING ENVIRONMENT: Lives with: lives with their son Lives in: House/apartment Stairs: 1 step to enter; 1 story home Has following equipment at home: Single point cane, Environmental consultant - 4 wheeled, and reports that she uses a cane at night and walker 2x/wk  OCCUPATION: pt is on disability   PATIENT GOALS: reduce intensity of migraines, reduce falls  NEXT MD VISIT: 02/27/24  OBJECTIVE:  Note: Objective measures were completed at Evaluation  unless otherwise noted. *assessment performed with lights off per pt's request   DIAGNOSTIC FINDINGS:  12/01/23 head CT: No acute intracranial abnormality, specifically, no acute hemorrhage, territorial infarction, or intracranial mass.   10/28/23 cervical CT: No acute intracranial abnormality. 2. No acute fracture in the cervical spine.  PATIENT SURVEYS:  NDI: 39/50 Minimum Detectable Change (90% confidence): 5 points or 10% points  COGNITION: Overall cognitive status: Within functional limits for tasks assessed  SENSATION: WFL  POSTURE: rounded, guarded posture. Pt rests with head to the R d/t c/o HA  PALPATION: Pt very TTP over B cervical and shoulder musculature with many areas of palpable muscle tightness, taut bands, trigger pts    CERVICAL ROM:   Active ROM A/PROM (deg) eval  Flexion 40 *pain in head and neck  Extension 50 *pain in head and neck  Right lateral flexion 27 *throbbing on top of head  Left lateral flexion 35 *throbbing on top of head  Right rotation 50   Left rotation 46   (Blank rows = not tested)    TREATMENT DATE: 01/02/24  PATIENT EDUCATION:  Education details: prognosis, POC, edu on exam findings, edu on migraine management techniques including exercise  Person educated: Patient Education method: Explanation Education comprehension: verbalized understanding  HOME EXERCISE PROGRAM: Not yet initiated   ASSESSMENT:  CLINICAL IMPRESSION:  Patient is a 58 y/o F presenting to OPPT with c/o chronic migraines since childhood and frequent falls. Patient today presenting with Rounded and guarded posture, soft tissue restriction and TTP in cervical and shoulder musculature, limited and painful cervical AROM, and photophobia.  Would benefit from skilled PT services 1-2 x/week for 8 weeks to address aforementioned impairments in  order to optimize level of function.    OBJECTIVE IMPAIRMENTS: decreased activity tolerance, decreased balance, decreased ROM, dizziness, increased muscle spasms, impaired flexibility, postural dysfunction, and pain.   ACTIVITY LIMITATIONS: carrying, lifting, bending, sitting, standing, squatting, sleeping, stairs, transfers, bed mobility, bathing, toileting, dressing, reach over head, hygiene/grooming, locomotion level, and caring for others  PARTICIPATION LIMITATIONS: meal prep, cleaning, laundry, driving, shopping, community activity, yard work, and church  PERSONAL FACTORS: Age, Behavior pattern, Fitness, Past/current experiences, Time since onset of injury/illness/exacerbation, and 3+ comorbidities: Anemia, bipolar disorder, chronic fatigue syndrome, depression, DM, fibromyalgia, HLD, HTN, osteoporosis, panic disorder, hx PE, RA, syncope are also affecting patient's functional outcome.   REHAB POTENTIAL: Good  CLINICAL DECISION MAKING: Evolving/moderate complexity  EVALUATION COMPLEXITY: Moderate    GOALS: Goals reviewed with patient? Yes  SHORT TERM GOALS: Target date: 01/30/2024  Patient to be independent with initial HEP. Baseline: HEP initiated Goal status: INITIAL    LONG TERM GOALS: Target date: 02/27/2024  Patient to be independent with advanced HEP. Baseline: Not yet initiated  Goal status: INITIAL  Patient to demonstrate cervical AROM WFL and without pain limiting.  Baseline: see above Goal status: INITIAL  Patient to report and demonstrate improved head, neck, and shoulder posture at rest and with activity.  Baseline: rounded, guarded Goal status: INITIAL  Patient to report understanding of exercise recommendations for migraine management.  Baseline: not yet initiated  Goal status: INITIAL  Patient to score at least 23/30 on FGA in order to decrease risk of falls.  Baseline: NT Goal status: INITIAL  Patient to recall 3 pain management techniques for  neck pain.    Baseline: not yet initiated Goal status: INITIAL  Patient to score 34 or less on NDI in order to meet MDC. Baseline: 39 Goal status: INITIAL    PLAN:  PT FREQUENCY: 1-2x/week  PT DURATION: 8 weeks  PLANNED INTERVENTIONS: 97164- PT Re-evaluation, 97750- Physical Performance Testing, 97110-Therapeutic exercises, 97530- Therapeutic activity, 97112- Neuromuscular re-education, 97535- Self Care, 02859- Manual therapy, [phone]- Gait training, [phone]- Canalith repositioning, [id number]- Electrical stimulation (unattended), 20560 (1-2 muscles), 20561 (3+ muscles)- Dry Needling, Patient/Family education, Balance training, Stair training, Taping, Joint mobilization, Vestibular training, Cryotherapy, and Moist heat  PLAN FOR NEXT SESSION: FGA, discuss how to safely step into bath tub to reduce falls risk, address cervical ROM and muscle tightness to reduce HA's, edu on exercise for reducing migraines and finding safest mode of exercise    Louana Terrilyn Christians, PT, DPT 01/02/24 5:15 PM  [facility] at [facility] [address] [name], [place] [address] Phone # [phone] Fax # [phone]

## 2024-01-02 ENCOUNTER — Ambulatory Visit: Attending: Neurology | Admitting: Physical Therapy

## 2024-01-02 ENCOUNTER — Encounter: Payer: Self-pay | Admitting: Physical Therapy

## 2024-01-02 ENCOUNTER — Other Ambulatory Visit: Payer: Self-pay

## 2024-01-02 DIAGNOSIS — M7918 Myalgia, other site: Secondary | ICD-10-CM | POA: Insufficient documentation

## 2024-01-02 DIAGNOSIS — R293 Abnormal posture: Secondary | ICD-10-CM | POA: Diagnosis present

## 2024-01-02 DIAGNOSIS — G43709 Chronic migraine without aura, not intractable, without status migrainosus: Secondary | ICD-10-CM | POA: Insufficient documentation

## 2024-01-02 DIAGNOSIS — M542 Cervicalgia: Secondary | ICD-10-CM | POA: Insufficient documentation

## 2024-01-02 DIAGNOSIS — R296 Repeated falls: Secondary | ICD-10-CM | POA: Insufficient documentation

## 2024-01-02 DIAGNOSIS — R252 Cramp and spasm: Secondary | ICD-10-CM | POA: Diagnosis present

## 2024-01-02 DIAGNOSIS — M6281 Muscle weakness (generalized): Secondary | ICD-10-CM | POA: Diagnosis present

## 2024-01-02 DIAGNOSIS — R262 Difficulty in walking, not elsewhere classified: Secondary | ICD-10-CM | POA: Insufficient documentation

## 2024-01-02 DIAGNOSIS — M797 Fibromyalgia: Secondary | ICD-10-CM | POA: Diagnosis present

## 2024-01-10 ENCOUNTER — Encounter: Payer: Self-pay | Admitting: Physical Therapy

## 2024-01-10 ENCOUNTER — Ambulatory Visit: Admitting: Physical Therapy

## 2024-01-10 DIAGNOSIS — R293 Abnormal posture: Secondary | ICD-10-CM

## 2024-01-10 DIAGNOSIS — R296 Repeated falls: Secondary | ICD-10-CM

## 2024-01-10 DIAGNOSIS — M542 Cervicalgia: Secondary | ICD-10-CM

## 2024-01-10 DIAGNOSIS — M6281 Muscle weakness (generalized): Secondary | ICD-10-CM

## 2024-01-10 DIAGNOSIS — M797 Fibromyalgia: Secondary | ICD-10-CM

## 2024-01-10 DIAGNOSIS — R252 Cramp and spasm: Secondary | ICD-10-CM

## 2024-01-10 DIAGNOSIS — R262 Difficulty in walking, not elsewhere classified: Secondary | ICD-10-CM

## 2024-01-10 NOTE — Therapy (Signed)
 OUTPATIENT PHYSICAL THERAPY TREATMENT   Patient Name: [name] MRN: [id number] DOB:May 22, 1966, 58 y.o., female Today's Date: 01/10/2024  END OF SESSION:  PT End of Session - 01/10/24 1536     Visit Number 2    Number of Visits 17    Date for PT Re-Evaluation 02/27/24    Authorization Type [employer]    Authorization Time Period auth submitted    PT Start Time 1536    PT Stop Time 1615    PT Time Calculation (min) 39 min    Activity Tolerance Patient tolerated treatment well;Patient limited by pain    Behavior During Therapy [facility] for tasks assessed/performed           Past Medical History:  Diagnosis Date   Acute kidney injury (HCC) 10/25/2020   Acute respiratory disease due to COVID-19 virus 10/25/2020   AKI (acute kidney injury) (HCC) 10/25/2020   Allergic rhinitis    Allergic to cats    pet dander   Allergy    Anemia    Anxiety    Arthritis 09/2018   both feet   Asthma    Back pain    Bipolar disorder (HCC)    Cervicalgia    Chronic fatigue syndrome    Chronic low back pain with left-sided sciatica    Class 3 severe obesity with serious comorbidity and body mass index (BMI) of 40.0 to 44.9 in adult 11/14/2014   Constipation    Depression    Diabetes mellitus without complication (HCC)    pre   Drug overdose, multiple drugs 11/01/2012   Dyspnea    Environmental allergies    Fibromyalgia    GERD (gastroesophageal reflux disease)    Grave's disease    Heart murmur    High cholesterol    History of blood clots    Hypertension    Joint pain    Lactose intolerance    Lower extremity edema    Multiple food allergies    OSA (obstructive sleep apnea)    Osteoporosis    Palpitations    Panic disorder    PE (pulmonary thromboembolism) (HCC)    on Eliquis    Pharyngoesophageal dysphagia 06/24/2014   Prediabetes 05/11/2019   Protein-calorie malnutrition, mild (HCC) 06/23/2021   Pulmonary embolism (HCC) 2024   Rheumatoid arthritis (HCC)    Risk  for falls    Sciatica    Severe 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 (HCC)    Sleep apnea    no cpap worn   Subclinical hypothyroidism 05/11/2019   Syncope    Syncope and collapse    Thyroid  disease    Vasovagal syncope    Vertigo    Vitamin D  deficiency    Past Surgical History:  Procedure Laterality Date   ABDOMINAL HYSTERECTOMY  2006   BREAST BIOPSY Left 05/30/2023   MM LT BREAST BX W LOC DEV 1ST LESION IMAGE BX SPEC STEREO GUIDE 05/30/2023 [facility]   CHOLECYSTECTOMY N/A 10/04/2022   Procedure: LAPAROSCOPIC CHOLECYSTECTOMY WITH INTRAOPERATIVE CHOLANGIOGRAM;  Surgeon: Belinda Cough, MD;  Location: [facility] OR;  Service: General;  Laterality: N/A;   COLONOSCOPY     GASTRIC BYPASS  2008   Patient Active Problem List   Diagnosis Date Noted   Cervical myofascial pain syndrome 12/13/2023   Excess skin 11/30/2023   Goiter 11/30/2023   Localized obesity 11/30/2023   Mood altered 11/30/2023   Polyphagia 11/30/2023   Chronic pain syndrome 11/30/2023   Pain 11/30/2023   Drug-induced  constipation 11/30/2023   Long term (current) use of opiate analgesic 11/30/2023   Lumbosacral spondylosis with radiculopathy 11/30/2023   Rib pain on left side 11/30/2023   Bipolar disorder, current episode depressed, moderate (HCC) 05/06/2023   Post-traumatic stress disorder, chronic 05/06/2023   Cholecystitis, acute with cholelithiasis 10/03/2022   Choledocholithiasis 10/02/2022   Acute left ankle pain 06/02/2022   Pulmonary embolism (HCC) 06/01/2022   History of pulmonary embolism 06/01/2022   Acute pain of left foot 05/27/2022   Type 2 diabetes mellitus without complications (HCC) 05/26/2022   Anemia 05/26/2022   Urinary retention 05/26/2022   Syncope and collapse 05/25/2022   Lumbosacral radiculopathy at L5 01/28/2022   Lumbosacral radiculopathy at S1 01/28/2022   Multiple neurological symptoms 06/23/2021   Metabolic syndrome 10/21/2020   Constipation 07/09/2020    Subclinical hypothyroidism 05/11/2019   Prediabetes 05/11/2019   Vitamin D  deficiency 05/11/2019   OSA (obstructive sleep apnea) 05/11/2019   Other symptoms and signs involving cognitive functions and awareness 02/27/2019   Intractable chronic migraine without aura and with status migrainosus 01/14/2016   Risk for falls 09/02/2015   Insomnia 05/27/2015   Bipolar II disorder (HCC) 02/25/2015   Bipolar disorder (HCC) 02/25/2015   Obesity due to excess calories 11/14/2014   Severe 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 (HCC) 10/10/2014   Generalized anxiety disorder 10/10/2014   Panic disorder with agoraphobia 10/10/2014   Social anxiety disorder 10/10/2014   Anxiety, generalized 10/10/2014   Asthma 08/01/2014   Depressive disorder 08/01/2014   Low back pain, unspecified 07/24/2014   Encounter for monitoring opioid maintenance therapy 07/02/2014   Neck pain 07/02/2014   Gastroesophageal reflux disease without esophagitis 06/24/2014   Pharyngoesophageal dysphagia 06/24/2014   S/P gastric bypass 06/24/2014   Hx of recurrent syncope 05/24/2013   Fibromyalgia 08/01/2012   Migraine 05/22/2012   Panic disorder 05/22/2012   Depression 11/22/2011   Other chronic pain 11/22/2011   Graves' disease    Need for prophylactic postmenopausal hormone replacement therapy 11/03/2011   Allergic rhinitis 09/28/2011   Essential hypertension 09/28/2011   Memory loss 09/28/2011    PCP: Joshua Santana CROME, NP   REFERRING PROVIDER: Ines Onetha NOVAK, MD  REFERRING DIAG: [id number] (ICD-10-CM) - Chronic migraine without aura without status migrainosus, not intractable M79.18 (ICD-10-CM) - Cervical myofascial pain syndrome M54.2 (ICD-10-CM) - Cervicalgia  THERAPY DIAG:  Cervicalgia  Abnormal posture  Repeated falls  Difficulty in walking, not elsewhere classified  Muscle weakness (generalized)  Fibromyalgia  Cramp and spasm  Rationale for Evaluation and Treatment:  Rehabilitation  ONSET DATE: since   SUBJECTIVE:  SUBJECTIVE STATEMENT: Pt reports she has 7/10 headache this afternoon. Took 2 tylenols. Has not eaten yet today.  From eval: Patient reports 2 migraines per week. Reports that her last migraine lasted 19 days. Currently suffering a migraine as well. Reports that during a migraine she also has neck pain. Patient reports that pain sometimes radiates down the R UE with tingling but not numbness. Reports that massages have helped in the past. Reports that she suspects her glasses are contributing to migraines. Reports that she also gets dizziness, mostly when she has a migraine but not always. Reports dizziness when she moves quickly. Reports that her BP runs low. Patient reports that she tends to fall a lot, especially when getting in and out of her garden tub and cannot get up from the floor.. Reports that she has tried a tub seat but it is not feasible d/t her toilet being so close to her tub.   Hand dominance: Right  PERTINENT HISTORY:  Anemia, bipolar disorder, chronic fatigue syndrome, depression, DM, fibromyalgia, HLD, HTN, osteoporosis, panic disorder, hx PE, RA, syncope  PAIN:  Are you having pain? Yes: NPRS scale: 8/10 Pain location: across forehead Pain description: headache Aggravating factors: bright lights Relieving factors: meds  PRECAUTIONS: Fall  RED FLAGS: None     WEIGHT BEARING RESTRICTIONS: No  FALLS:  Has patient fallen in last 6 months? Yes. Number of falls pt reports that she falls 2x/weeks  LIVING ENVIRONMENT: Lives with: lives with their son Lives in: House/apartment Stairs: 1 step to enter; 1 story home Has following equipment at home: Single point cane, Environmental consultant - 4 wheeled, and reports that she uses a cane at  night and walker 2x/wk  OCCUPATION: pt is on disability; likes going to water aerobics  PATIENT GOALS: reduce intensity of migraines, reduce falls  NEXT MD VISIT: 02/27/24  OBJECTIVE:  Note: Objective measures were completed at Evaluation unless otherwise noted.  TREATMENT DATE:  01/10/2024 Activity Comment  Seated: Thoracic ext in chair x10 Thoracic rotation in chair x2 breaths going into bigger motions of rotation Scapular protraction/retraction x5 breaths Ant/post pelvic tilt x10 with diaphragmatic breaths Scap squeeze into ball behind chair x10 UT stretch x 30 R&L Cervical rotation AROM x5 R&L Cervical retraction x10 Cues for diaphragmatic breaths Pt reports some dizziness - pt provided with nutrigrain bar  FGA  See below        PATIENT EDUCATION:  Education details: HEP Person educated: Patient Education method: Explanation Education comprehension: verbalized understanding  HOME EXERCISE PROGRAM: Access Code: [email or web address] URL: [email or web address] Date: 01/10/2024 Prepared by: Delorse Shane April Earnie Starring  Exercises - Seated Thoracic Lumbar Extension with Pectoralis Stretch  - 1 x daily - 7 x weekly - 1 sets - 10 reps - Seated Cervical Rotation AROM  - 1 x daily - 7 x weekly - 1 sets - 10 reps - Seated Upper Trapezius Stretch  - 1 x daily - 7 x weekly - 2 sets - 30 sec hold - Seated Cervical Retraction  - 2-3 x daily - 7 x weekly - 1 sets - 10 reps    DIAGNOSTIC FINDINGS:  12/01/23 head CT: No acute intracranial abnormality, specifically, no acute hemorrhage, territorial infarction, or intracranial mass.   10/28/23 cervical CT: No acute intracranial abnormality. 2. No acute fracture in the cervical spine.  PATIENT SURVEYS:  NDI: 39/50 Minimum Detectable Change (90% confidence): 5 points or 10% points  COGNITION: Overall cognitive status: Within functional limits for tasks assessed  SENSATION: [facility]  POSTURE: rounded, guarded posture. Pt rests  with head to the R d/t c/o HA  PALPATION: Pt very TTP over B cervical and shoulder musculature with many areas of palpable muscle tightness, taut bands, trigger pts    CERVICAL ROM:   Active ROM A/PROM (deg) eval  Flexion 40 *pain in head and neck  Extension 50 *pain in head and neck  Right lateral flexion 27 *throbbing on top of head  Left lateral flexion 35 *throbbing on top of head  Right rotation 50   Left rotation 46   (Blank rows = not tested)   FUNCTIONAL GAIT ASSESSMENT  Date: 01/10/24 Score  GAIT LEVEL SURFACE Instructions: Walk at your normal speed from here to the next mark (6 m) [20 ft]. (2) Mild impairment - Walks 6 m (20 ft) in less than 7 seconds but greater than 5.5 seconds, uses assistive device, slower speed, mild gait deviations, or deviates 15.24 -25.4 cm (6 -10 in) outside of the 30.48-cm (12-in) walkway width.  2.   CHANGE IN GAIT SPEED Instructions: Begin walking at your normal pace (for 1.5 m [5 ft]). When I tell you "go," walk as fast as you can (for 1.5 m [5 ft]). When I tell you "slow," walk as slowly as you can (for 1.5 m [5 ft]. (2) Mild impairment - Is able to change speed but demonstrates mild gait deviations, deviates 15.24 -25.4 cm (6 -10 in) outside of the 30.48-cm (12-in) walkway width, or no gait deviations but unable to achieve a significant change in velocity, or uses an assistive device  3.    GAIT WITH HORIZONTAL HEAD TURNS Instructions: Walk from here to the next mark 6 m (20 ft) away. Begin walking at your normal pace. Keep walking straight; after 3 steps, turn your head to the right and keep walking straight while looking to the right. After 3 more steps, turn your head to the left and keep walking straight while looking left. Continue alternating looking right and left. (2) Mild impairment - Performs head turns smoothly with slight change in gait velocity (eg, minor disruption to smooth gait path), deviates 15.24 -25.4 cm (6 -10 in) outside 30.48-cm  (12-in) walkway width, or uses an assistive device.  4.   GAIT WITH VERTICAL HEAD TURNS Instructions: Walk from here to the next mark (6 m [20 ft]). Begin walking at your normal pace. Keep walking straight; after 3 steps, tip your head up and keep walking straight while looking up. After 3 more steps, tip your head down, keep walking straight while looking down. Continue  alternating looking up and down every 3 steps until you have completed 2 repetitions in each direction. (2) Mild impairment - Performs task with slight change in gait velocity (eg, minor disruption to smooth gait path), deviates 15.24 -25.4 cm (6 -10 in) outside 30.48-cm (12-in) walkway width or uses assistive device.  5.  GAIT AND PIVOT TURN Instructions: Begin with walking at your normal pace. When I tell you, "turn and stop," turn as quickly as you can to face the opposite direction and stop. (3) Normal - Pivot turns safely within 3 seconds and stops quickly with no loss of balance  6.   STEP OVER OBSTACLE Instructions: Begin walking at your normal speed. When you come to the shoe box, step over it, not around it, and keep walking. (1) Moderate impairment - Is able to step over one shoe box (11.43 cm [4.5 in] total height) but must slow down and adjust  steps to clear box safely. May require verbal cueing.  7.   GAIT WITH NARROW BASE OF SUPPORT Instructions: Walk on the floor with arms folded across the chest, feet aligned heel to toe in tandem for a distance of 3.6 m [12 ft]. The number of steps taken in a straight line are counted for a maximum of 10 steps. (1) Moderate impairment - Ambulates 4 -7 steps.  8.   GAIT WITH EYES CLOSED Instructions: Walk at your normal speed from here to the next mark (6 m [20 ft]) with your eyes closed. (1) Moderate impairment - Walks 6 m (20 ft), slow speed, abnormal gait pattern, evidence for imbalance, deviates 25.4 -38.1 cm (10 -15 in) outside 30.48-cm (12-in) walkway width. Requires more than 9  seconds to ambulate 6 m (20 ft).  9.   AMBULATING BACKWARDS Instructions: Walk backwards until I tell you to stop (2) Mild impairment - Walks 6 m (20 ft), uses assistive device, slower speed, mild gait deviations, deviates 15.24 -25.4 cm (6 -10 in) outside 30.48-cm (12-in) walkway width  10. STEPS Instructions: Walk up these stairs as you would at home (ie, using the rail if necessary). At the top turn around and walk down. (2) Mild impairment-Alternating feet, must use rail.  Total 18/30   Interpretation of scores: Non-Specific Older Adults Cutoff Score: <=22/30 = risk of falls Parkinson's Disease Cutoff score <15/30= fall risk (Hoehn & Yahr 1-4)  Minimally Clinically Important Difference (MCID)  Stroke (acute, subacute, and chronic) = MDC: 4.2 points Vestibular (acute) = MDC: 6 points Community Dwelling Older Adults =  MCID: 4 points Parkinson's Disease  =  MDC: 4.3 points  (Academy of Neurologic Physical Therapy (nd). Functional Gait Assessment. Retrieved from https://www.neuropt.org/docs/default-source/cpgs/core-outcome-measures/function-gait-assessment-pocket-guide-proof9-(2).pdf?sfvrsn=b39f35043_0.)    ASSESSMENT:  CLINICAL IMPRESSION: Treatment focused on establishing cervical ROM and stretching HEP. Encouraged pt to work on neck stretches outside of water aerobics. Included diaphragmatic breathing to try and inhibit her muscular tensions in her neck. Worked on decreasing rounded posture and improving postural stability/mobility. Some dizziness with just thoracic extension exercises -- pt noted she has not eaten yet today. Provided pt with a snack prior to performing FGA with pt reporting improved symptoms. FGA score places pt at a risk of falls.   From eval: Patient is a 58 y/o F presenting to OPPT with c/o chronic migraines since childhood and frequent falls. Patient today presenting with Rounded and guarded posture, soft tissue restriction and TTP in cervical and shoulder  musculature, limited and painful cervical AROM, and photophobia.  Would benefit from skilled PT services 1-2 x/week for 8 weeks to address aforementioned impairments in order to optimize level of function.    OBJECTIVE IMPAIRMENTS: decreased activity tolerance, decreased balance, decreased ROM, dizziness, increased muscle spasms, impaired flexibility, postural dysfunction, and pain.   ACTIVITY LIMITATIONS: carrying, lifting, bending, sitting, standing, squatting, sleeping, stairs, transfers, bed mobility, bathing, toileting, dressing, reach over head, hygiene/grooming, locomotion level, and caring for others  PARTICIPATION LIMITATIONS: meal prep, cleaning, laundry, driving, shopping, community activity, yard work, and church  PERSONAL FACTORS: Age, Behavior pattern, Fitness, Past/current experiences, Time since onset of injury/illness/exacerbation, and 3+ comorbidities: Anemia, bipolar disorder, chronic fatigue syndrome, depression, DM, fibromyalgia, HLD, HTN, osteoporosis, panic disorder, hx PE, RA, syncope are also affecting patient's functional outcome.   REHAB POTENTIAL: Good  CLINICAL DECISION MAKING: Evolving/moderate complexity  EVALUATION COMPLEXITY: Moderate    GOALS: Goals reviewed with patient? Yes  SHORT TERM GOALS: Target date: 01/30/2024  Patient to be independent with  initial HEP. Baseline: HEP initiated Goal status: INITIAL    LONG TERM GOALS: Target date: 02/27/2024  Patient to be independent with advanced HEP. Baseline: Not yet initiated  Goal status: INITIAL  Patient to demonstrate cervical AROM WFL and without pain limiting.  Baseline: see above Goal status: INITIAL  Patient to report and demonstrate improved head, neck, and shoulder posture at rest and with activity.  Baseline: rounded, guarded Goal status: INITIAL  Patient to report understanding of exercise recommendations for migraine management.  Baseline: not yet initiated  Goal status:  INITIAL  Patient to score at least 23/30 on FGA in order to decrease risk of falls.  Baseline: 18/30 Goal status: INITIAL  Patient to recall 3 pain management techniques for neck pain.    Baseline: not yet initiated Goal status: INITIAL  Patient to score 34 or less on NDI in order to meet MDC. Baseline: 39 Goal status: INITIAL    PLAN:  PT FREQUENCY: 1-2x/week  PT DURATION: 8 weeks  PLANNED INTERVENTIONS: 02835- PT Re-evaluation, 97750- Physical Performance Testing, 97110-Therapeutic exercises, 97530- Therapeutic activity, 97112- Neuromuscular re-education, [phone]- Self Care, 02859- Manual therapy, [phone]- Gait training, [phone]- Canalith repositioning, H9716- Electrical stimulation (unattended), 20560 (1-2 muscles), 20561 (3+ muscles)- Dry Needling, Patient/Family education, Balance training, Stair training, Taping, Joint mobilization, Vestibular training, Cryotherapy, and Moist heat  PLAN FOR NEXT SESSION: single limb strength/stability, discuss how to safely step into bath tub to reduce falls risk, address cervical ROM and muscle tightness to reduce HA's (manual therapy/stretching, TENS?), edu on exercise for reducing migraines and finding safest mode of exercise    Anitra Doxtater April Ma L Caidyn Henricksen, PT, DPT 01/10/24 3:36 PM  [facility] at [facility] [address] [place], [place] [address] Phone # [phone] Fax # [phone]

## 2024-01-18 ENCOUNTER — Ambulatory Visit

## 2024-01-26 ENCOUNTER — Ambulatory Visit: Admitting: Physical Therapy

## 2024-01-30 ENCOUNTER — Telehealth: Payer: Self-pay | Admitting: Neurology

## 2024-01-30 NOTE — Telephone Encounter (Signed)
 Completed auth renewal via CMM, status is pending. Key: [id number]

## 2024-01-31 ENCOUNTER — Ambulatory Visit: Admitting: Physical Therapy

## 2024-02-02 NOTE — Telephone Encounter (Signed)
 Received approval, pt will continue to fill through [facility].  Auth#: [id number] (01/30/24-04/30/24)

## 2024-02-08 ENCOUNTER — Ambulatory Visit

## 2024-02-09 ENCOUNTER — Ambulatory Visit: Admitting: Physical Therapy

## 2024-02-10 ENCOUNTER — Telehealth: Payer: Self-pay | Admitting: Neurology

## 2024-02-10 DIAGNOSIS — F419 Anxiety disorder, unspecified: Secondary | ICD-10-CM

## 2024-02-13 NOTE — Telephone Encounter (Signed)
 Requested Prescriptions   Pending Prescriptions Disp Refills   ALPRAZolam  (XANAX ) 0.5 MG tablet [Pharmacy Med Name: ALPRAZOLAM  0.5 MG TABLET] 30 tablet 1    Sig: TAKE 1-2 TABS 30 MINUTES PRIOR TO BOTOX  FOR ANXIETY. HAVE SOMEONE DRIVE YOU.   Last seen 12/12/23 Next appt 02/27/24 Dispenses   Dispensed Days Supply Quantity Provider Pharmacy  ALPRAZOLAM  0.5 MG TABLET 01/02/2024 15 30 each Ines Onetha NOVAK, MD [facility] [phone] - [facility]...  ALPRAZOLAM  0.5 MG TABLET 08/25/2023 15 30 each Ines Onetha NOVAK, MD [facility] [phone] - [facility]...    Routing to np

## 2024-02-14 MED ORDER — ALPRAZOLAM 0.5 MG PO TABS: 0.5000 mg | ORAL_TABLET | Freq: Two times a day (BID) | ORAL | 0 refills | Status: AC | PRN

## 2024-02-14 NOTE — Addendum Note (Signed)
 Addended by: SHERRYL DUWAINE SQUIBB on: 02/14/2024 10:24 AM   Modules accepted: Orders

## 2024-02-16 ENCOUNTER — Ambulatory Visit: Admitting: Physical Therapy

## 2024-02-20 ENCOUNTER — Emergency Department (HOSPITAL_COMMUNITY)

## 2024-02-20 ENCOUNTER — Emergency Department (HOSPITAL_COMMUNITY)
Admission: EM | Admit: 2024-02-20 | Discharge: 2024-02-20 | Disposition: A | Discharge status: Home / Self Care | Attending: Emergency Medicine | Admitting: Emergency Medicine

## 2024-02-20 ENCOUNTER — Encounter (HOSPITAL_COMMUNITY): Payer: Self-pay

## 2024-02-20 DIAGNOSIS — E876 Hypokalemia: Secondary | ICD-10-CM | POA: Insufficient documentation

## 2024-02-20 DIAGNOSIS — R946 Abnormal results of thyroid function studies: Secondary | ICD-10-CM | POA: Diagnosis not present

## 2024-02-20 DIAGNOSIS — Z7901 Long term (current) use of anticoagulants: Secondary | ICD-10-CM | POA: Insufficient documentation

## 2024-02-20 DIAGNOSIS — K5900 Constipation, unspecified: Secondary | ICD-10-CM | POA: Diagnosis not present

## 2024-02-20 DIAGNOSIS — Z91040 Latex allergy status: Secondary | ICD-10-CM | POA: Insufficient documentation

## 2024-02-20 DIAGNOSIS — E039 Hypothyroidism, unspecified: Secondary | ICD-10-CM | POA: Diagnosis not present

## 2024-02-20 DIAGNOSIS — I10 Essential (primary) hypertension: Secondary | ICD-10-CM | POA: Insufficient documentation

## 2024-02-20 DIAGNOSIS — R112 Nausea with vomiting, unspecified: Secondary | ICD-10-CM | POA: Insufficient documentation

## 2024-02-20 DIAGNOSIS — R109 Unspecified abdominal pain: Secondary | ICD-10-CM | POA: Diagnosis present

## 2024-02-20 LAB — COMPREHENSIVE METABOLIC PANEL WITH GFR
ALT: 27 U/L (ref 0–44)
AST: 35 U/L (ref 15–41)
Albumin: 4.4 g/dL (ref 3.5–5.0)
Alkaline Phosphatase: 78 U/L (ref 38–126)
Anion gap: 12 (ref 5–15)
BUN: 13 mg/dL (ref 6–20)
CO2: 26 mmol/L (ref 22–32)
Calcium: 9.3 mg/dL (ref 8.9–10.3)
Chloride: 106 mmol/L (ref 98–111)
Creatinine, Ser: 0.98 mg/dL (ref 0.44–1.00)
GFR, Estimated: >60 mL/min (ref >60)
Glucose, Bld: 116 mg/dL — ABNORMAL HIGH (ref 70–99)
Potassium: 2.9 mmol/L — ABNORMAL LOW (ref 3.5–5.1)
Sodium: 144 mmol/L (ref 135–145)
Total Bilirubin: <0.2 mg/dL (ref 0.0–1.2)
Total Protein: 6.9 g/dL (ref 6.5–8.1)

## 2024-02-20 LAB — CBC WITH DIFFERENTIAL/PLATELET
Abs Immature Granulocytes: 0.01 K/uL (ref 0.00–0.07)
Basophils Absolute: 0 K/uL (ref 0.0–0.1)
Basophils Relative: 0 %
Eosinophils Absolute: 0.1 K/uL (ref 0.0–0.5)
Eosinophils Relative: 2 %
HCT: 41.9 % (ref 36.0–46.0)
Hemoglobin: 13.4 g/dL (ref 12.0–15.0)
Immature Granulocytes: 0 %
Lymphocytes Relative: 25 %
Lymphs Abs: 1.3 K/uL (ref 0.7–4.0)
MCH: 29.3 pg (ref 26.0–34.0)
MCHC: 32 g/dL (ref 30.0–36.0)
MCV: 91.5 fL (ref 80.0–100.0)
Monocytes Absolute: 0.8 K/uL (ref 0.1–1.0)
Monocytes Relative: 15 %
Neutro Abs: 3.1 K/uL (ref 1.7–7.7)
Neutrophils Relative %: 58 %
Platelets: 239 K/uL (ref 150–400)
RBC: 4.58 MIL/uL (ref 3.87–5.11)
RDW: 12 % (ref 11.5–15.5)
WBC: 5.3 K/uL (ref 4.0–10.5)
nRBC: 0 % (ref 0.0–0.2)

## 2024-02-20 LAB — I-STAT CHEM 8, ED
BUN: 14 mg/dL (ref 6–20)
Calcium, Ion: 1.2 mmol/L (ref 1.15–1.40)
Chloride: 106 mmol/L (ref 98–111)
Creatinine, Ser: 1.1 mg/dL — ABNORMAL HIGH (ref 0.44–1.00)
Glucose, Bld: 118 mg/dL — ABNORMAL HIGH (ref 70–99)
HCT: 38 % (ref 36.0–46.0)
Hemoglobin: 12.9 g/dL (ref 12.0–15.0)
Potassium: 2.9 mmol/L — ABNORMAL LOW (ref 3.5–5.1)
Sodium: 145 mmol/L (ref 135–145)
TCO2: 27 mmol/L (ref 22–32)

## 2024-02-20 LAB — T4, FREE: Free T4: 0.74 ng/dL (ref 0.61–1.12)

## 2024-02-20 LAB — TSH: TSH: 9.53 u[IU]/mL — ABNORMAL HIGH (ref 0.350–4.500)

## 2024-02-20 LAB — LIPASE, BLOOD: Lipase: 38 U/L (ref 11–51)

## 2024-02-20 MED ORDER — FLEET ENEMA RE ENEM
1.0000 | ENEMA | Freq: Once | RECTAL | Status: AC
  Administered 2024-02-20: 1 via RECTAL
  Filled 2024-02-20: qty 1

## 2024-02-20 MED ORDER — LACTATED RINGERS IV BOLUS
1000.0000 mL | Freq: Once | INTRAVENOUS | Status: AC
  Administered 2024-02-20: 1000 mL via INTRAVENOUS

## 2024-02-20 MED ORDER — POTASSIUM CHLORIDE 10 MEQ/100ML IV SOLN
10.0000 meq | Freq: Once | INTRAVENOUS | Status: AC
  Administered 2024-02-20: 10 meq via INTRAVENOUS
  Filled 2024-02-20: qty 100

## 2024-02-20 MED ORDER — ONDANSETRON HCL 4 MG/2ML IJ SOLN
4.0000 mg | Freq: Once | INTRAMUSCULAR | Status: AC
  Administered 2024-02-20: 4 mg via INTRAVENOUS
  Filled 2024-02-20: qty 2

## 2024-02-20 MED ORDER — POTASSIUM CHLORIDE 20 MEQ PO PACK
40.0000 meq | PACK | Freq: Two times a day (BID) | ORAL | Status: DC
  Administered 2024-02-20: 40 meq via ORAL
  Filled 2024-02-20: qty 2

## 2024-02-20 MED ORDER — FLEET ENEMA RE ENEM: 1.0000 | ENEMA | Freq: Every day | RECTAL | 0 refills | Status: AC | PRN

## 2024-02-20 MED ORDER — POLYETHYLENE GLYCOL 3350 17 G PO PACK: 17.0000 g | PACK | Freq: Two times a day (BID) | ORAL | 0 refills | Status: AC

## 2024-02-20 MED ORDER — IOHEXOL 300 MG/ML  SOLN
100.0000 mL | Freq: Once | INTRAMUSCULAR | Status: AC | PRN
  Administered 2024-02-20: 100 mL via INTRAVENOUS

## 2024-02-20 NOTE — ED Notes (Signed)
 Fleet Enema administered. Pt disconnected from monitoring so she is able to freely use the restroom. No complaints at this time.

## 2024-02-20 NOTE — Discharge Instructions (Signed)
 Take the medications as prescribed to help with your constipation. Potassium was low today.  Our records show that you are on 10 meq a day.  I would increase that to twice daily and follow-up with your primary care doctor in a  week to have the potassium level rechecked. Your thyroid  tests were also abnormal.  Follow-up with your primary care doctor or endocrinologist to follow-up on those tests.  Low thyroid  levels can contribute to constipation. Take the stool softener as prescribed.  Follow-up with a GI doctor to discuss an outpatient regimen to help prevent further episodes of constipation

## 2024-02-20 NOTE — ED Triage Notes (Signed)
 Pt arrived via POV, c/o constipation. States I haven't gone in 13 weeks States abd pain and vomiting after after eating. States she has had to disimpact herself several times.

## 2024-02-20 NOTE — ED Provider Notes (Signed)
 [place] EMERGENCY DEPARTMENT AT [facility] Provider Note   CSN: [id number] Arrival date & time: 02/20/24  9166     Patient presents with: Abdominal Pain   [name] is a 58 y.o. female.    Abdominal Pain    Patient has a history of asthma acid reflux, fibromyalgia, depression, hypertension, panic disorder, rheumatoid arthritis, lactose intolerance, constipation, bipolar disorder, subclinical hypothyroidism, pulmonary embolism.  Patient presents ED with complaints of constipation ongoing for 13 weeks.  Patient does have prior history of bariatric surgery.  She has had trouble with constipation in the past.  She takes Linzess .  Patient states she has not had a bowel movement for 13 weeks.  She has had to manually disimpact.  Patient states she has been feeling increasingly weak over the last couple weeks and has not been able to get out of bed much.  She reports some nausea and vomiting.  Symptoms get worse with eating.  She has not seen anyone for the symptoms prior to today.  Prior to Admission medications   Medication Sig Start Date End Date Taking? Authorizing Provider  polyethylene glycol (MIRALAX ) 17 g packet Take 17 g by mouth 2 (two) times daily. 02/20/24  Yes Randol Simmonds, MD  sodium phosphate  (FLEET) ENEM Place 133 mLs (1 enema total) rectally daily as needed for severe constipation. 02/20/24  Yes Randol Simmonds, MD  albuterol  (PROVENTIL  HFA) 108 (90 Base) MCG/ACT inhaler Inhale 2 puffs into the lungs every 4 (four) hours as needed for wheezing or shortness of breath.    [provider]  ALPRAZolam  (XANAX ) 0.5 MG tablet Take 1 tablet (0.5 mg total) by mouth 2 (two) times daily as needed for anxiety. 02/14/24   Sherryl Bouchard, NP  apixaban  (ELIQUIS ) 5 MG TABS tablet Take 5 mg by mouth 2 (two) times daily.    [provider]  Atogepant  (QULIPTA ) 60 MG TABS Take 1 tablet (60 mg total) by mouth at bedtime. 12/13/23   Ines Onetha NOVAK, MD  azelastine   (ASTELIN ) 0.1 % nasal spray Place 2 sprays into both nostrils daily as needed for allergies. Use in each nostril as directed    [provider]  BIOTIN  PO Take 1 capsule by mouth daily.    [provider]  botulinum toxin Type A  (BOTOX ) 200 units injection INJECT 155 UNITS INTO THE  MUSCLES OF THE HEAD AND NECK  EVERY 12 WEEKS (DISCARD UNUSED  AFTER FIRST USE) 10/13/23   Ines Onetha NOVAK, MD  budesonide-formoterol  [facility]) 80-4.5 MCG/ACT inhaler Inhale 2 puffs into the lungs as directed.    [provider]  cetirizine (ZYRTEC) 10 MG tablet Take 10 mg by mouth daily. 10/20/21   [provider]  citalopram  (CELEXA ) 20 MG tablet Take 20 mg by mouth daily.    [provider]  CREON 36000-114000 units CPEP capsule Take 36,000 Units by mouth as directed. 01/20/23   [provider]  [facility] ACETAMINOPHEN  EX ST 500 MG tablet Take 1,000 mg by mouth every 6 (six) hours as needed. 07/15/23   [provider]  Cyanocobalamin  (VITAMIN B-12 PO) Take 1 capsule by mouth daily.    [provider]  cyclobenzaprine  (FLEXERIL ) 10 MG tablet Take 1 tablet (10 mg total) by mouth at bedtime. 12/12/23   Ines Onetha NOVAK, MD  diclofenac  Sodium (VOLTAREN ) 1 % GEL Apply 4 g topically 4 (four) times daily. 06/04/22   Joshua Domino, DO  diphenhydrAMINE  (SOMINEX) 25 MG tablet Take 25 mg by  mouth as needed for itching, allergies or sleep.    [provider]  EPINEPHrine  (EPIPEN  2-PAK) 0.3 mg/0.3 mL IJ SOAJ injection Inject 0.3 mg into the muscle as needed for anaphylaxis.    [provider]  Ergocalciferol  (VITAMIN D2 PO) Take by mouth.    [provider]  eszopiclone (LUNESTA) 1 MG TABS tablet Take 1 mg by mouth at bedtime as needed. 10/21/23   [provider]  fluticasone  (FLONASE ) 50 MCG/ACT nasal spray Place 1 spray into both nostrils as needed (allergies/runny nose). 06/04/22   Joshua Domino, DO  Galcanezumab -gnlm (EMGALITY ) 120  MG/ML SOAJ INJECT 120 MG INTO THE SKIN EVERY 30 DAYS 04/26/23   Ines Onetha NOVAK, MD  hydrochlorothiazide  (HYDRODIURIL ) 12.5 MG tablet Take 12.5 mg by mouth daily.    [provider]  ibuprofen  (ADVIL ) 800 MG tablet Take 800 mg by mouth every 8 (eight) hours as needed. 06/11/23   [provider]  lamoTRIgine  (LAMICTAL ) 150 MG tablet Take 150 mg by mouth daily. 11/17/23   [provider]  lidocaine  (LIDODERM ) 5 % Place 1 patch onto the skin daily as needed (pain).    [provider]  LINZESS  72 MCG capsule Take 72 mcg by mouth daily before breakfast.    [provider]  Melatonin 10 MG CHEW Chew 30 mg by mouth at bedtime as needed (sleep).    [provider]  montelukast  (SINGULAIR ) 10 MG tablet Take 10 mg by mouth at bedtime as needed (allergies). Patient not taking: Reported on 01/02/2024 08/07/19   [provider]  MOUNJARO 10 MG/0.5ML Pen Inject 10 mg into the skin once a week. 09/24/23   [provider]  olopatadine  (PATANOL) 0.1 % ophthalmic solution Place 1 drop into both eyes daily at 6 (six) AM.    [provider]  pantoprazole  (PROTONIX ) 20 MG tablet Take 1 tablet (20 mg total) by mouth daily. 04/21/23   Neysa Caron PARAS, DO  potassium chloride  (KLOR-CON  M) 10 MEQ tablet Take 10 mEq by mouth daily.    [provider]  prazosin  (MINIPRESS ) 1 MG capsule Take 1 mg by mouth at bedtime. 02/11/23   [provider]  QUEtiapine  (SEROQUEL ) 25 MG tablet Take 25 mg by mouth at bedtime. 11/18/23   [provider]  raNITIdine HCl (RANITIDINE 150 MAX STRENGTH PO) Take 1 tablet by mouth daily.    [provider]  tapentadol  (NUCYNTA ) 50 MG tablet Take 50 mg by mouth as needed. 06/02/23   [provider]  tirzepatide CLOYDE) 10 MG/0.5ML Pen Inject 10 mg into the skin once a week.    [provider]  triamcinolone cream (KENALOG) 0.1 % Apply 1 Application topically 2 (two) times  daily. 07/11/18   [provider]  TRINTELLIX 10 MG TABS tablet Take 10 mg by mouth daily.    [provider]  trolamine salicylate (MOBISYL) 10 % cream Apply 1 Application topically 2 (two) times daily as needed. 12/30/16   [provider]  TROLAMINE SALICYLATE EX Apply 1 Application topically in the morning and at bedtime.    [provider]  UNKNOWN TO PATIENT Caltrate + D3 (600mg  plus 40mcg): Take 1 tablet by mouth once daily.    [provider]  Zavegepant HCl (ZAVZPRET ) 10 MG/ACT SOLN Place 1 spray into the nose daily as needed. 12/12/23   Ines Onetha NOVAK, MD    Allergies: Bee venom, Bioflavonoid products, Cocamide, Coconut (cocos nucifera), Coconut fatty acid, Coconut oil, Food,  Mixed ragweed, Molds & smuts, Mushroom ext cmplx(shiitake-reishi-mait), Other, Shellfish allergy, Strawberry extract, Ambrosia artemisiifolia (ragweed) skin test, Doxepin , Oxycodone , Hydrocodone -acetaminophen , and Latex    Review of Systems  Gastrointestinal:  Positive for abdominal pain.    Updated Vital Signs BP 103/69 (BP Location: Right Arm)   Pulse 85   Temp 97.9 F (36.6 C) (Oral)   Resp 18   SpO2 98%   Physical Exam Vitals and nursing note reviewed.  Constitutional:      Appearance: She is well-developed. She is not diaphoretic.  HENT:     Head: Normocephalic and atraumatic.     Right Ear: External ear normal.     Left Ear: External ear normal.  Eyes:     General: No scleral icterus.       Right eye: No discharge.        Left eye: No discharge.     Conjunctiva/sclera: Conjunctivae normal.  Neck:     Trachea: No tracheal deviation.  Cardiovascular:     Rate and Rhythm: Normal rate and regular rhythm.  Pulmonary:     Effort: Pulmonary effort is normal. No respiratory distress.     Breath sounds: Normal breath sounds. No stridor. No wheezing or rales.  Abdominal:     General: Bowel sounds are normal. There is no distension.     Palpations:  Abdomen is soft.     Tenderness: There is no abdominal tenderness. There is no guarding or rebound.  Genitourinary:    Comments: No fecal impaction, some soft brown stool noted in proximal rectal vault, no masses palpated on exam Musculoskeletal:        General: No tenderness or deformity.     Cervical back: Neck supple.  Skin:    General: Skin is warm and dry.     Findings: No rash.  Neurological:     General: No focal deficit present.     Mental Status: She is alert.     Cranial Nerves: No cranial nerve deficit, dysarthria or facial asymmetry.     Sensory: No sensory deficit.     Motor: No abnormal muscle tone or seizure activity.     Coordination: Coordination normal.  Psychiatric:        Mood and Affect: Mood normal.     (all labs ordered are listed, but only abnormal results are displayed) Labs Reviewed  COMPREHENSIVE METABOLIC PANEL WITH GFR - Abnormal; Notable for the following components:      Result Value   Potassium 2.9 (*)    Glucose, Bld 116 (*)    All other components within normal limits  TSH - Abnormal; Notable for the following components:   TSH 9.530 (*)    All other components within normal limits  I-STAT CHEM 8, ED - Abnormal; Notable for the following components:   Potassium 2.9 (*)    Creatinine, Ser 1.10 (*)    Glucose, Bld 118 (*)    All other components within normal limits  LIPASE, BLOOD  CBC WITH DIFFERENTIAL/PLATELET  T4, FREE    EKG: None  Radiology: CT ABDOMEN PELVIS W CONTRAST Result Date: 02/20/2024 CLINICAL DATA:  Diffuse abdominal pain and vomiting after eating. Constipation. EXAM: CT ABDOMEN AND PELVIS WITH CONTRAST TECHNIQUE: Multidetector CT imaging of the abdomen and pelvis was performed using the standard protocol following bolus administration of intravenous contrast. RADIATION DOSE REDUCTION: This exam was performed according to the departmental dose-optimization program which includes automated exposure control, adjustment of the  mA and/or kV according to  patient size and/or use of iterative reconstruction technique. CONTRAST:  OMNIPAQUE  IOHEXOL  300 MG/ML  SOLN COMPARISON:  04/21/2023 FINDINGS: Lower chest: Heart is normal size. Lung bases are clear. Slight elevation of the left hemidiaphragm unchanged. Hepatobiliary: Previous cholecystectomy. Slight diffuse decrease attenuation of the liver likely representing a degree of steatosis. No focal liver mass. Biliary tree is unremarkable. Pancreas: Normal. Spleen: Normal. Adrenals/Urinary Tract: Adrenal glands are normal. Kidneys are normal size without hydronephrosis or nephrolithiasis. Ureters and bladder are normal. Stomach/Bowel: Postoperative changes over the stomach compatible previous gastric bypass surgery. Surgical suture line over the small bowel in the left upper abdomen and midline mid abdomen. Small bowel is normal in caliber. Appendix is normal. Mild to moderate fecal retention throughout the colon. Vascular/Lymphatic: Abdominal aorta is normal in caliber. Remaining vascular structures are unremarkable. No adenopathy. Reproductive: Status post hysterectomy. No adnexal masses. Other: No free fluid or free peritoneal air. No focal inflammatory change. Musculoskeletal: Stable Schmorl's nodes over the superior endplate of T9 and T10 with mild associated loss mid vertebral body height. IMPRESSION: 1. No acute findings in the abdomen/pelvis. 2. Mild to moderate fecal retention throughout the colon. 3. Previous cholecystectomy, hysterectomy and gastric bypass surgery. 4. Slight diffuse decrease attenuation of the liver likely representing a degree of steatosis. Electronically Signed   By: Toribio Agreste M.D.   On: 02/20/2024 11:21     Procedures   Medications Ordered in the ED  potassium chloride  10 mEq in 100 mL IVPB (10 mEq Intravenous New Bag/Given 02/20/24 1407)  potassium chloride  (KLOR-CON ) packet 40 mEq (40 mEq Oral Given 02/20/24 1233)  lactated ringers  bolus 1,000 mL  (0 mLs Intravenous Stopped 02/20/24 1210)  ondansetron  (ZOFRAN ) injection 4 mg (4 mg Intravenous Given 02/20/24 1016)  iohexol  (OMNIPAQUE ) 300 MG/ML solution 100 mL (100 mLs Intravenous Contrast Given 02/20/24 1041)  sodium phosphate  (FLEET) enema 1 enema (1 enema Rectal Given 02/20/24 1210)    Clinical Course as of 02/20/24 1418  Mon Feb 20, 2024  1143 TSH(!) Tsh increased  [JK]  1144 CT scan without acute findings. Moderate fecal retention in colon [JK]  1412 Patient states she is feeling better after the enema. [JK]  1413 Her labs did show hypokalemia but no signs of anemia.  Patient does have history of hypokalemia and is on potassium supplements [JK]    Clinical Course User Index [JK] Randol Simmonds, MD                                 Medical Decision Making Problems Addressed: Abnormal thyroid  function test: acute illness or injury that poses a threat to life or bodily functions Constipation, unspecified constipation type: acute illness or injury that poses a threat to life or bodily functions Hypokalemia: acute illness or injury that poses a threat to life or bodily functions  Amount and/or Complexity of Data Reviewed Labs: ordered. Decision-making details documented in ED Course. Radiology: ordered and independent interpretation performed.  Risk OTC drugs. Prescription drug management.   Patient presented to the ED for evaluation of constipation.  Concerned about the possibility of colitis diverticulitis obstruction in addition to constipation. CT scan fortunately does not show any acute abnormality other than constipation.  Labs do not show any signs of anemia or severe dehydration. Patient is hypokalemic.  She states she is on chronic potassium replacement.  She was given additional meds.  Will have her increase her outpatient potassium and have  her follow-up with her doctor to be rechecked. Thyroid  tests are also abnormal.  Will have patient follow-up with her primary care  doctor.  It is possible hypothyroidism could be contributing to her constipation issues. Patient improved after an enema in the ED.  Will discharge home with MiraLAX .  Discussed outpatient follow-up for continued bowel regimen to help prevent recurrent episodes of constipation     Final diagnoses:  Constipation, unspecified constipation type  Hypokalemia  Abnormal thyroid  function test    ED Discharge Orders          Ordered    sodium phosphate  (FLEET) ENEM  Daily PRN        02/20/24 1418    polyethylene glycol (MIRALAX ) 17 g packet  2 times daily        02/20/24 1418               Randol Simmonds, MD 02/20/24 1419

## 2024-02-21 ENCOUNTER — Other Ambulatory Visit: Payer: Self-pay | Admitting: Neurology

## 2024-02-21 DIAGNOSIS — G43711 Chronic migraine without aura, intractable, with status migrainosus: Secondary | ICD-10-CM

## 2024-02-26 NOTE — Progress Notes (Deleted)
 01/31/23: Reports that Botox  works well for her migraines. Did NOT take xanax  today.   11/04/22: Botox  continues to work well. Still has to take xanax  before her injections. Significant other is with her today.   07/26/22: Reports that she presently gets 2 migraines a week.  Botox  has been extremely helpful for her.  She does have to take Xanax  before her injections.  04/19/2022: On botox  only gets 4 migraines a month. IS VERY ANXIOUS TAKES XANAX  before coming in be very gentle with injections   01/19/2022: Super anxious, but doing very well with botox , > 50% improvement in migraine freq and severity. Gave xanax  to use prior to injections, discussed risks of taking xanax  with nucynta  including resp failure and death, discussed take one tablet 30-60 minutes before botox  procedure, do not take at any other time, she undertood and agreed. will discuss disposing of any unused medication at next appointment     10/27/2021: stable doing well. She lost 30 pounds, her hair looks great, she had an episode of dizziness and weakness wasn;t eating or drinking after a breakup, she has had some falls due to right leg weakness pending mri lumbar spine we wll see her on the 22nd.     07/21/2021: She gets > 70% relief migraine frequency, extremely anxious, do the botox  fast, +a   04/21/2021: stable, still doing same if not better > 60% improvement. She broke up with her boyfriend. Patient states she did not take the xanax , she is here alone, I warned her not to drive if she takes xanax  or clonazepam  and have someone drive her.    1/74/7977: getting back on track. Will give some xanax  for next time she is incredibly stressed when she comes in, she is lovely, here with fiance   Interval history 04/28/2020: Doing great, More than 60% improvement in frequency but also in severity. Ajovy  not approve using emgality  instead. she has failed imitrex  and maxalt and cannot take anymore triptans, contraindicated due to side  effects, gave her some samples. She feels clenching is a problem and she has pain when clenching in the temples as well and we give her muscle relaxers since we cannot put botox  there. She had HTN on aimovig , stopped. She had botox  in the past and we restarted on 03/2019, doing great. Loves emgality  as an add on as well. No date set for her wedding yet, it has been 2.5 years. She is very anxious and took anxiety medication today and did better but she is very anxious. She wants an outdoor wedding. She did much better, she took her anxiety and pain meds. [facility]    BOTOX  PROCEDURE NOTE FOR MIGRAINE HEADACHE    Contraindications and precautions discussed with patient(above). Aseptic procedure was observed and patient tolerated procedure. Procedure performed by Duwaine Russell, NP  The condition has existed for more than 6 months, and pt does not have a diagnosis of ALS, Myasthenia Gravis or Lambert-Eaton Syndrome.  Risks and benefits of injections discussed and pt agrees to proceed with the procedure.  Written consent obtained  These injections are medically necessary. These injections do not cause sedations or hallucinations which the oral therapies may cause.  Indication/Diagnosis: chronic migraine BOTOX (G9414) injection was performed according to protocol by [employer]. 200 units of BOTOX  was dissolved into 4 cc NS.   NDC: [phone]  Type of toxin: Botox   Botox  consent signed Botox - 200 units x 1 vial Lot:  [id number] Expiration: 04/2025 NDC: 9976-6078-97   Bacteriostatic 0.9% Sodium Chloride - 4 mL  Lot: [id number] Expiration: 08/23/2023 NDC: 9590-8033-97   Dx: H56.288       Description of procedure:  The patient was placed in a sitting position. The standard protocol was used for Botox  as follows, with 5 units of Botox  injected at each site:   -Procerus muscle, midline injection  -Corrugator muscle, bilateral  injection  -Frontalis muscle, bilateral injection, with 2 sites each side, medial injection was performed in the upper one third of the frontalis muscle, in the region vertical from the medial inferior edge of the superior orbital rim. The lateral injection was again in the upper one third of the forehead vertically above the lateral limbus of the cornea, 1.5 cm lateral to the medial injection site.  -Temporalis muscle injection, 4 sites, bilaterally. The first injection was 3 cm above the tragus of the ear, second injection site was 1.5 cm to 3 cm up from the first injection site in line with the tragus of the ear. The third injection site was 1.5-3 cm forward between the first 2 injection sites. The fourth injection site was 1.5 cm posterior to the second injection site.  -Occipitalis muscle injection, 3 sites, bilaterally. The first injection was done one half way between the occipital protuberance and the tip of the mastoid process behind the ear. The second injection site was done lateral and superior to the first, 1 fingerbreadth from the first injection. The third injection site was 1 fingerbreadth superiorly and medially from the first injection site.  -Cervical paraspinal muscle injection, 2 sites, bilateral knee first injection site was 1 cm from the midline of the cervical spine, 3 cm inferior to the lower border of the occipital protuberance. The second injection site was 1.5 cm superiorly and laterally to the first injection site.  -Trapezius muscle injection was performed at 3 sites, bilaterally. The first injection site was in the upper trapezius muscle halfway between the inflection point of the neck, and the acromion. The second injection site was one half way between the acromion and the first injection site. The third injection was done between the first injection site and the inflection point of the neck.   Will return for repeat injection in 3 months.   A 200 units of Botox  was  used, 155 units were injected, the rest of the Botox  was wasted. The patient tolerated the procedure well, there were no complications of the above procedure.  Duwaine Russell, MSN, NP-C 02/26/2024, 4:02 PM [facility] [address] [place], [place] [address] [phone]

## 2024-02-27 ENCOUNTER — Ambulatory Visit: Admitting: Adult Health

## 2024-02-27 ENCOUNTER — Telehealth: Payer: Self-pay | Admitting: *Deleted

## 2024-02-27 NOTE — Telephone Encounter (Signed)
 I called pt and to let her know that the alprazolam  0.5mg  tabs are for anxiety prior to her botox , this was not to be taken regularly.  She took as was prescribed 0.5mg  po bid as needed for anxiety.  (Lasting 15 days).  I told her that if her pcp wants to prescribe this for her then she can follow up with her, if she does not then will need to wean off slowly and then for her botox  only 1-2 tabs prior to botox .  Pt verbalized understanding, but then was asking about being see later or by someone else for her botox  today.  I did reach out to Theodosia, NP and she is not able to see her previous call/appt that she has scheduled, and other NP all other appt have checked in.  She was instructed to go to urgent care for acute migraine care )or virtual appt with [facility].  She lost family member this weekend and is very stressed relating to this understandably.  She was persistent in saying she would come and sit for cancellation,  I repeated multiple times that no availability.  I told her will have someone call to reschedule her botox .

## 2024-02-27 NOTE — Telephone Encounter (Signed)
 See phone call

## 2024-02-27 NOTE — Telephone Encounter (Signed)
 Called pt and r/s for 10/20 with Megan. I offered her 2 different times tomorrow and she said she can't due to another appt. She kept saying she was going to come and sit in the lobby to see if someone can see her. Informed her that the last few patients of the day have checked in and none of the providers will be able to see her today. She states she would still like to come sit in the lobby. Informed front desk and Duwaine HERO.

## 2024-03-04 NOTE — Progress Notes (Unsigned)
 03/05/24: Reports that Botox  has been beneficial for her migraines.  She has approximately 2 migraines a week.  She is currently on Emgality  but is no longer on Qulipta .  We did discuss Xanax  prescription.  I explained that per Dr. Ala notes it was intended for her to take 1 to 2 tablets before her Botox  procedure.  Advised that if she is finding it helpful she can certainly discuss with her psychiatrist about keeping her on this medication otherwise if her psychiatrist does not want to keep her on the medicine then she needs to call our office and we will start weaning her off the medication.  Did caution her about stopping it abruptly.  Patient's speech sounds slightly slurred today.  She does show me 2 prescriptions of Seroquel  that she has been taking.  She thought they were 2 different medications.  I advised that she reach out to her psychiatrist to see what dosage she should be on.  She verbalized understanding.  We will also get her set up with a new neurologist in her office.   BOTOX  PROCEDURE NOTE FOR MIGRAINE HEADACHE    Contraindications and precautions discussed with patient(above). Aseptic procedure was observed and patient tolerated procedure. Procedure performed by Duwaine Russell, NP  The condition has existed for more than 6 months, and pt does not have a diagnosis of ALS, Myasthenia Gravis or Lambert-Eaton Syndrome.  Risks and benefits of injections discussed and pt agrees to proceed with the procedure.  Written consent obtained  Indication/Diagnosis: chronic migraine BOTOX (G9414) injection was performed according to protocol by [employer]. 200 units of BOTOX  was dissolved into 4 cc NS.   NDC: [address]-98  Type of toxin: Botox   Botox  consent signed Botox - 200 units x 1 vial Lot: [id number] Expiration: 06/2026 NDC: 9976-6078-97   Bacteriostatic 0.9% Sodium Chloride - 4 mL  Lot: [id number] Expiration: 08/22/2024 NDC: 9590-8033-97     Description of procedure:  The  patient was placed in a sitting position. The standard protocol was used for Botox  as follows, with 5 units of Botox  injected at each site:   -Procerus muscle, midline injection  -Corrugator muscle, bilateral injection  -Frontalis muscle, bilateral injection, with 2 sites each side, medial injection was performed in the upper one third of the frontalis muscle, in the region vertical from the medial inferior edge of the superior orbital rim. The lateral injection was again in the upper one third of the forehead vertically above the lateral limbus of the cornea, 1.5 cm lateral to the medial injection site.  -Temporalis muscle injection, 4 sites, bilaterally. The first injection was 3 cm above the tragus of the ear, second injection site was 1.5 cm to 3 cm up from the first injection site in line with the tragus of the ear. The third injection site was 1.5-3 cm forward between the first 2 injection sites. The fourth injection site was 1.5 cm posterior to the second injection site.  -Occipitalis muscle injection, 3 sites, bilaterally. The first injection was done one half way between the occipital protuberance and the tip of the mastoid process behind the ear. The second injection site was done lateral and superior to the first, 1 fingerbreadth from the first injection. The third injection site was 1 fingerbreadth superiorly and medially from the first injection site.  -Cervical paraspinal muscle injection, 2 sites, bilateral knee first injection site was 1 cm from the midline of the cervical spine, 3 cm inferior to the lower border of the occipital protuberance. The  second injection site was 1.5 cm superiorly and laterally to the first injection site.  -Trapezius muscle injection was performed at 3 sites, bilaterally. The first injection site was in the upper trapezius muscle halfway between the inflection point of the neck, and the acromion. The second injection site was one half way between the  acromion and the first injection site. The third injection was done between the first injection site and the inflection point of the neck.   Will return for repeat injection in 3 months.   A 200 unit sof Botox  was used, 155 units were injected, the rest of the Botox  was wasted. The patient tolerated the procedure well, there were no complications of the above procedure.  Duwaine Russell, MSN, NP-C 03/04/2024, 8:49 PM [facility] [address], [address] [place], [place] [address] [phone]

## 2024-03-05 ENCOUNTER — Ambulatory Visit (INDEPENDENT_AMBULATORY_CARE_PROVIDER_SITE_OTHER): Admitting: Adult Health

## 2024-03-05 VITALS — BP 98/64 | HR 99

## 2024-03-05 DIAGNOSIS — G43711 Chronic migraine without aura, intractable, with status migrainosus: Secondary | ICD-10-CM

## 2024-03-05 MED ORDER — ONABOTULINUMTOXINA 200 UNITS IJ SOLR
155.0000 [IU] | Freq: Once | INTRAMUSCULAR | Status: AC
  Administered 2024-03-05: 155 [IU] via INTRAMUSCULAR

## 2024-03-05 NOTE — Progress Notes (Signed)
 Botox  consent signed Botox - 200 units x 1 vial Lot: [id number] Expiration: 06/2026 NDC: 9976-6078-97   Bacteriostatic 0.9% Sodium Chloride - 4 mL  Lot: [id number] Expiration: 08/22/2024 NDC: 9590-8033-97   Dx: H56.288 S/P  Witnessed by S. [facility] CMA

## 2024-03-12 ENCOUNTER — Ambulatory Visit: Admitting: Adult Health

## 2024-03-19 ENCOUNTER — Ambulatory Visit: Admitting: Adult Health

## 2024-03-27 ENCOUNTER — Telehealth: Payer: Self-pay | Admitting: Neurology

## 2024-03-27 NOTE — Telephone Encounter (Signed)
 Pt called stating if she need appt  that coming Pt was already seen this year , Pt would like to  Know . ,Pt states if not would it be okay to schedule for next year  with New Provider

## 2024-04-02 NOTE — Telephone Encounter (Signed)
 Called and LVM for pt to call back and r/s appt to 12/11/24 with newly assigned provider.

## 2024-04-05 ENCOUNTER — Ambulatory Visit: Admitting: Neurology

## 2024-04-12 ENCOUNTER — Other Ambulatory Visit: Payer: Self-pay

## 2024-04-12 ENCOUNTER — Encounter (HOSPITAL_BASED_OUTPATIENT_CLINIC_OR_DEPARTMENT_OTHER): Payer: Self-pay | Admitting: Plastic Surgery

## 2024-04-12 NOTE — H&P (Signed)
 Subjective Patient ID: [name] is a 58 y.o. female.     HPI   Returns for follow up discussion prior to planned panniculectomy. Highest weight  269 lb. 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 Lowest weight following this 239 lb. Regained some of this weight. She has had over 100 lb wt loss over last year, on Mounjaro. She has lost 28 lb since Jan 2025 visit here, appears from chart review to have been at this weight for 3 months.   Reports recurrent rashes beneath panniculus that has failed to be controlled with over 3 mo hygiene measures, baby powder use. Denies antibiotic use. Reports pain in soft tissue. Uses topical pain medication and ice packs to try to control this.    PMH significant for PE diagnosed 05/2022. Patient states she has been off Eliquis  since November 2024. She had hypercoaguable work up that was negative.   PMH significant for Graves Bipolar II fibromyalgia chronic pain on pain contract with [facility] and on Nucynta . Bipolar managed by [facility].    Disabled secondary to above. Lives with adult son.   Review of Systems   Objective Physical Exam  Cardiovascular: Normal rate, regular rhythm and normal heart sounds.    Pulmonary/Chest Effort normal and breath sounds normal.    Skin   Fitzpatrick 6   Abd panniculus present that extends to symphysis pubis, Group 1 panniculus     Assessment/Plan S/p gastric bypass Panniculitis Hx PE off Elquis   Hold Mounjaro week prior to surgery.   Chronic panniculitis following stabilization weight loss that has failed medical management for over 3 month trial.   Reviewed panniculectomy vs abdominoplasty. Reviewed panniculectomy is not a cosmetic procedure. Reviewed changes with aging, wt gain/loss. Reviewed possible overnight stay, drains, post op limitations. Reviewed scar maturation over months. Reviewed expected area scars area soft tissue resection and expected elevation mons in mirror. Counseled  will not change contour mons, will not affect back or thighs. Reviewed panniculectomy will not affect contour upper abdomen. Quoted 100% chance wound healing problems. Reviewed this is not permanent operation and changes with weight and aging discussed.   Reviewed that I do not recommend abdominal and thigh soft tissue surgery at same time. In general sequence is abdomen first as this can have small effect on upper thighs.   Additional risks including but not limited to bleeding, hematoma, seroma, need for additional procedures, infection, unacceptable cosmetic result, blood clots in legs or lungs, damage to adjacent structures discussed. Completed ASPS consent for panniculectomy.    Drain teaching completed. On pain contract. She will inform pain provider of surgery. If needed I will provide one time Rx on day of surgery. Tentative plan OP surgery.   Earlis Ranks, MD [facility] Plastic & Reconstructive Surgery  Office/ physician access line after hours [phone]

## 2024-04-24 ENCOUNTER — Ambulatory Visit (HOSPITAL_BASED_OUTPATIENT_CLINIC_OR_DEPARTMENT_OTHER): Admitting: Anesthesiology

## 2024-04-24 ENCOUNTER — Ambulatory Visit (HOSPITAL_BASED_OUTPATIENT_CLINIC_OR_DEPARTMENT_OTHER)
Admission: RE | Admit: 2024-04-24 | Discharge: 2024-04-25 | Disposition: A | Discharge status: Home / Self Care | Attending: Plastic Surgery | Admitting: Plastic Surgery

## 2024-04-24 ENCOUNTER — Encounter (HOSPITAL_BASED_OUTPATIENT_CLINIC_OR_DEPARTMENT_OTHER)
Admission: RE | Disposition: A | Discharge status: Home / Self Care | Payer: Self-pay | Source: Home / Self Care | Attending: Plastic Surgery

## 2024-04-24 ENCOUNTER — Encounter (HOSPITAL_BASED_OUTPATIENT_CLINIC_OR_DEPARTMENT_OTHER): Admitting: Anesthesiology

## 2024-04-24 ENCOUNTER — Other Ambulatory Visit: Payer: Self-pay

## 2024-04-24 ENCOUNTER — Encounter (HOSPITAL_BASED_OUTPATIENT_CLINIC_OR_DEPARTMENT_OTHER): Payer: Self-pay | Admitting: Plastic Surgery

## 2024-04-24 DIAGNOSIS — E119 Type 2 diabetes mellitus without complications: Secondary | ICD-10-CM

## 2024-04-24 DIAGNOSIS — M793 Panniculitis, unspecified: Secondary | ICD-10-CM | POA: Diagnosis present

## 2024-04-24 DIAGNOSIS — I10 Essential (primary) hypertension: Secondary | ICD-10-CM

## 2024-04-24 DIAGNOSIS — Z01818 Encounter for other preprocedural examination: Secondary | ICD-10-CM

## 2024-04-24 DIAGNOSIS — Z7984 Long term (current) use of oral hypoglycemic drugs: Secondary | ICD-10-CM

## 2024-04-24 HISTORY — PX: PANNICULECTOMY: SHX5360

## 2024-04-24 LAB — BASIC METABOLIC PANEL WITH GFR
Anion gap: 6 (ref 5–15)
BUN: 15 mg/dL (ref 6–20)
CO2: 31 mmol/L (ref 22–32)
Calcium: 9.2 mg/dL (ref 8.9–10.3)
Chloride: 103 mmol/L (ref 98–111)
Creatinine, Ser: 0.75 mg/dL (ref 0.44–1.00)
GFR, Estimated: >60 mL/min (ref >60)
Glucose, Bld: 85 mg/dL (ref 70–99)
Potassium: 4 mmol/L (ref 3.5–5.1)
Sodium: 140 mmol/L (ref 135–145)

## 2024-04-24 SURGERY — PANNICULECTOMY
Anesthesia: General | Site: Abdomen

## 2024-04-24 MED ORDER — MIDAZOLAM HCL 2 MG/2ML IJ SOLN
INTRAMUSCULAR | Status: AC
  Filled 2024-04-24: qty 2

## 2024-04-24 MED ORDER — HYDROMORPHONE HCL 1 MG/ML IJ SOLN
INTRAMUSCULAR | Status: AC
  Filled 2024-04-24: qty 0.5

## 2024-04-24 MED ORDER — LIDOCAINE 2% (20 MG/ML) 5 ML SYRINGE
INTRAMUSCULAR | Status: AC
  Filled 2024-04-24: qty 5

## 2024-04-24 MED ORDER — CYCLOBENZAPRINE HCL 10 MG PO TABS
10.0000 mg | ORAL_TABLET | Freq: Every day | ORAL | Status: DC
  Administered 2024-04-24: 10 mg via ORAL
  Filled 2024-04-24: qty 1

## 2024-04-24 MED ORDER — CEFAZOLIN SODIUM-DEXTROSE 2-4 GM/100ML-% IV SOLN
INTRAVENOUS | Status: AC
  Filled 2024-04-24: qty 100

## 2024-04-24 MED ORDER — DIPHENHYDRAMINE HCL 50 MG/ML IJ SOLN
INTRAMUSCULAR | Status: DC | PRN
  Administered 2024-04-24: 12.5 mg via INTRAVENOUS

## 2024-04-24 MED ORDER — DIPHENHYDRAMINE HCL 25 MG PO CAPS: 25.0000 mg | ORAL_CAPSULE | Freq: Four times a day (QID) | ORAL | Status: DC | PRN

## 2024-04-24 MED ORDER — SODIUM CHLORIDE 0.9 % IV SOLN
12.5000 mg | INTRAVENOUS | Status: DC | PRN
  Filled 2024-04-24: qty 0.5

## 2024-04-24 MED ORDER — ALPRAZOLAM 0.25 MG PO TABS: 0.5000 mg | ORAL_TABLET | Freq: Two times a day (BID) | ORAL | Status: DC | PRN

## 2024-04-24 MED ORDER — AMISULPRIDE (ANTIEMETIC) 5 MG/2ML IV SOLN: 10.0000 mg | Freq: Once | INTRAVENOUS | Status: DC | PRN

## 2024-04-24 MED ORDER — CHLORHEXIDINE GLUCONATE CLOTH 2 % EX PADS: 6.0000 | MEDICATED_PAD | Freq: Once | CUTANEOUS | Status: DC

## 2024-04-24 MED ORDER — HEPARIN SODIUM (PORCINE) 5000 UNIT/ML IJ SOLN
5000.0000 [IU] | Freq: Once | INTRAMUSCULAR | Status: AC
  Administered 2024-04-24: 5000 [IU] via SUBCUTANEOUS

## 2024-04-24 MED ORDER — MIDAZOLAM HCL 5 MG/5ML IJ SOLN
INTRAMUSCULAR | Status: DC | PRN
  Administered 2024-04-24: 2 mg via INTRAVENOUS

## 2024-04-24 MED ORDER — KETOROLAC TROMETHAMINE 30 MG/ML IJ SOLN
30.0000 mg | Freq: Three times a day (TID) | INTRAMUSCULAR | Status: AC
  Administered 2024-04-24 – 2024-04-25 (×3): 30 mg via INTRAVENOUS
  Filled 2024-04-24 (×3): qty 1

## 2024-04-24 MED ORDER — PANCRELIPASE (LIP-PROT-AMYL) 36000-114000 UNITS PO CPEP
36000.0000 [IU] | ORAL_CAPSULE | Freq: Three times a day (TID) | ORAL | Status: DC
  Administered 2024-04-24 (×2): 36000 [IU] via ORAL
  Filled 2024-04-24 (×3): qty 1

## 2024-04-24 MED ORDER — ONDANSETRON HCL 4 MG/2ML IJ SOLN
INTRAMUSCULAR | Status: AC
  Filled 2024-04-24: qty 2

## 2024-04-24 MED ORDER — ONDANSETRON HCL 4 MG/2ML IJ SOLN
INTRAMUSCULAR | Status: DC | PRN
  Administered 2024-04-24: 4 mg via INTRAVENOUS

## 2024-04-24 MED ORDER — ACETAMINOPHEN 500 MG PO TABS
ORAL_TABLET | ORAL | Status: AC
  Filled 2024-04-24: qty 1

## 2024-04-24 MED ORDER — LAMOTRIGINE 150 MG PO TABS
150.0000 mg | ORAL_TABLET | Freq: Every day | ORAL | Status: DC
  Administered 2024-04-24: 150 mg via ORAL
  Filled 2024-04-24: qty 1

## 2024-04-24 MED ORDER — QUETIAPINE FUMARATE 25 MG PO TABS
25.0000 mg | ORAL_TABLET | Freq: Every day | ORAL | Status: DC
  Administered 2024-04-24: 25 mg via ORAL
  Filled 2024-04-24: qty 1

## 2024-04-24 MED ORDER — LIDOCAINE HCL (CARDIAC) PF 100 MG/5ML IV SOSY
PREFILLED_SYRINGE | INTRAVENOUS | Status: DC | PRN
  Administered 2024-04-24: 60 mg via INTRAVENOUS
  Administered 2024-04-24: 40 mg via INTRAVENOUS

## 2024-04-24 MED ORDER — PROPOFOL 10 MG/ML IV BOLUS
INTRAVENOUS | Status: AC
  Filled 2024-04-24: qty 20

## 2024-04-24 MED ORDER — HYDROMORPHONE HCL 1 MG/ML IJ SOLN: 0.5000 mg | INTRAMUSCULAR | Status: DC | PRN

## 2024-04-24 MED ORDER — PROPOFOL 10 MG/ML IV BOLUS
INTRAVENOUS | Status: DC | PRN
  Administered 2024-04-24: 140 mg via INTRAVENOUS

## 2024-04-24 MED ORDER — FENTANYL CITRATE (PF) 100 MCG/2ML IJ SOLN
INTRAMUSCULAR | Status: AC
  Filled 2024-04-24: qty 2

## 2024-04-24 MED ORDER — LACTATED RINGERS IV SOLN: INTRAVENOUS | Status: DC

## 2024-04-24 MED ORDER — DIPHENHYDRAMINE HCL 50 MG/ML IJ SOLN: 25.0000 mg | Freq: Four times a day (QID) | INTRAMUSCULAR | Status: DC | PRN

## 2024-04-24 MED ORDER — VORTIOXETINE HBR 5 MG PO TABS
10.0000 mg | ORAL_TABLET | Freq: Every day | ORAL | Status: DC
  Filled 2024-04-24: qty 2

## 2024-04-24 MED ORDER — PROPOFOL 500 MG/50ML IV EMUL
INTRAVENOUS | Status: DC | PRN
  Administered 2024-04-24: 50 ug/kg/min via INTRAVENOUS

## 2024-04-24 MED ORDER — ONDANSETRON HCL 4 MG/2ML IJ SOLN: 4.0000 mg | Freq: Four times a day (QID) | INTRAMUSCULAR | Status: DC | PRN

## 2024-04-24 MED ORDER — PANTOPRAZOLE SODIUM 20 MG PO TBEC
20.0000 mg | DELAYED_RELEASE_TABLET | Freq: Every day | ORAL | Status: DC
  Administered 2024-04-24: 20 mg via ORAL
  Filled 2024-04-24: qty 1

## 2024-04-24 MED ORDER — HYDROCHLOROTHIAZIDE 12.5 MG PO TABS
12.5000 mg | ORAL_TABLET | Freq: Every day | ORAL | Status: DC
  Filled 2024-04-24: qty 1

## 2024-04-24 MED ORDER — ROCURONIUM BROMIDE 100 MG/10ML IV SOLN
INTRAVENOUS | Status: DC | PRN
  Administered 2024-04-24: 60 mg via INTRAVENOUS
  Administered 2024-04-24 (×4): 10 mg via INTRAVENOUS

## 2024-04-24 MED ORDER — SUGAMMADEX SODIUM 200 MG/2ML IV SOLN
INTRAVENOUS | Status: DC | PRN
  Administered 2024-04-24: 150 mg via INTRAVENOUS

## 2024-04-24 MED ORDER — LINACLOTIDE 72 MCG PO CAPS
72.0000 ug | ORAL_CAPSULE | Freq: Every day | ORAL | Status: DC
  Administered 2024-04-24: 72 ug via ORAL
  Filled 2024-04-24: qty 1

## 2024-04-24 MED ORDER — GABAPENTIN 300 MG PO CAPS
ORAL_CAPSULE | ORAL | Status: AC
  Filled 2024-04-24: qty 1

## 2024-04-24 MED ORDER — KCL IN DEXTROSE-NACL 20-5-0.45 MEQ/L-%-% IV SOLN
INTRAVENOUS | Status: DC
  Filled 2024-04-24: qty 1000

## 2024-04-24 MED ORDER — CEFAZOLIN SODIUM-DEXTROSE 2-4 GM/100ML-% IV SOLN
2.0000 g | INTRAVENOUS | Status: AC
  Administered 2024-04-24: 2 g via INTRAVENOUS

## 2024-04-24 MED ORDER — FENTANYL CITRATE (PF) 100 MCG/2ML IJ SOLN
25.0000 ug | INTRAMUSCULAR | Status: DC | PRN
  Administered 2024-04-24 (×3): 50 ug via INTRAVENOUS

## 2024-04-24 MED ORDER — PHENYLEPHRINE HCL (PRESSORS) 10 MG/ML IV SOLN
INTRAVENOUS | Status: DC | PRN
  Administered 2024-04-24 (×5): 80 ug via INTRAVENOUS

## 2024-04-24 MED ORDER — HYDROMORPHONE HCL 1 MG/ML IJ SOLN
0.5000 mg | INTRAMUSCULAR | Status: DC | PRN
  Administered 2024-04-24 (×2): 0.5 mg via INTRAVENOUS

## 2024-04-24 MED ORDER — FLUTICASONE FUROATE-VILANTEROL 100-25 MCG/ACT IN AEPB: 1.0000 | INHALATION_SPRAY | Freq: Every day | RESPIRATORY_TRACT | Status: DC

## 2024-04-24 MED ORDER — OLOPATADINE HCL 0.1 % OP SOLN: 1.0000 [drp] | Freq: Every day | OPHTHALMIC | Status: DC

## 2024-04-24 MED ORDER — HEPARIN SODIUM (PORCINE) 5000 UNIT/ML IJ SOLN
INTRAMUSCULAR | Status: AC
  Filled 2024-04-24: qty 1

## 2024-04-24 MED ORDER — GABAPENTIN 300 MG PO CAPS
300.0000 mg | ORAL_CAPSULE | ORAL | Status: AC
  Administered 2024-04-24: 300 mg via ORAL

## 2024-04-24 MED ORDER — 0.9 % SODIUM CHLORIDE (POUR BTL) OPTIME
TOPICAL | Status: DC | PRN
  Administered 2024-04-24: 1000 mL

## 2024-04-24 MED ORDER — ENOXAPARIN SODIUM 40 MG/0.4ML IJ SOSY
40.0000 mg | PREFILLED_SYRINGE | INTRAMUSCULAR | Status: DC
  Administered 2024-04-25: 40 mg via SUBCUTANEOUS
  Filled 2024-04-24: qty 0.4

## 2024-04-24 MED ORDER — CITALOPRAM HYDROBROMIDE 20 MG PO TABS
20.0000 mg | ORAL_TABLET | Freq: Every day | ORAL | Status: DC
  Administered 2024-04-24: 20 mg via ORAL
  Filled 2024-04-24: qty 1

## 2024-04-24 MED ORDER — FENTANYL CITRATE (PF) 100 MCG/2ML IJ SOLN
INTRAMUSCULAR | Status: DC | PRN
  Administered 2024-04-24: 50 ug via INTRAVENOUS
  Administered 2024-04-24: 25 ug via INTRAVENOUS
  Administered 2024-04-24 (×2): 50 ug via INTRAVENOUS
  Administered 2024-04-24: 25 ug via INTRAVENOUS

## 2024-04-24 MED ORDER — OXYCODONE HCL 5 MG PO TABS
5.0000 mg | ORAL_TABLET | ORAL | Status: DC | PRN
  Administered 2024-04-24 (×2): 5 mg via ORAL
  Administered 2024-04-24: 10 mg via ORAL
  Administered 2024-04-25 (×2): 5 mg via ORAL
  Filled 2024-04-24 (×2): qty 1
  Filled 2024-04-24: qty 2
  Filled 2024-04-24 (×2): qty 1

## 2024-04-24 MED ORDER — PRAZOSIN HCL 1 MG PO CAPS
1.0000 mg | ORAL_CAPSULE | Freq: Every day | ORAL | Status: DC
  Administered 2024-04-24: 1 mg via ORAL
  Filled 2024-04-24: qty 1

## 2024-04-24 MED ORDER — DIPHENHYDRAMINE HCL 50 MG/ML IJ SOLN
INTRAMUSCULAR | Status: AC
  Filled 2024-04-24: qty 1

## 2024-04-24 MED ORDER — OXYCODONE HCL 5 MG PO TABS: 5.0000 mg | ORAL_TABLET | Freq: Four times a day (QID) | ORAL | 0 refills | Status: DC | PRN

## 2024-04-24 MED ORDER — BUPIVACAINE HCL (PF) 0.5 % IJ SOLN
INTRAMUSCULAR | Status: DC | PRN
  Administered 2024-04-24: 30 mL

## 2024-04-24 MED ORDER — DEXAMETHASONE SOD PHOSPHATE PF 10 MG/ML IJ SOLN
INTRAMUSCULAR | Status: DC | PRN
  Administered 2024-04-24: 10 mg via INTRAVENOUS

## 2024-04-24 MED ORDER — ONDANSETRON 4 MG PO TBDP
4.0000 mg | ORAL_TABLET | Freq: Four times a day (QID) | ORAL | Status: DC | PRN
  Administered 2024-04-24: 4 mg via ORAL
  Filled 2024-04-24: qty 1

## 2024-04-24 MED ORDER — ACETAMINOPHEN 500 MG PO TABS
1000.0000 mg | ORAL_TABLET | Freq: Once | ORAL | Status: AC
  Administered 2024-04-24: 1000 mg via ORAL

## 2024-04-24 MED ORDER — ACETAMINOPHEN 500 MG PO TABS: 1000.0000 mg | ORAL_TABLET | ORAL | Status: DC

## 2024-04-24 MED ORDER — ROCURONIUM BROMIDE 10 MG/ML (PF) SYRINGE
PREFILLED_SYRINGE | INTRAVENOUS | Status: AC
  Filled 2024-04-24: qty 10

## 2024-04-24 MED ORDER — PHENYLEPHRINE 80 MCG/ML (10ML) SYRINGE FOR IV PUSH (FOR BLOOD PRESSURE SUPPORT)
PREFILLED_SYRINGE | INTRAVENOUS | Status: AC
  Filled 2024-04-24: qty 10

## 2024-04-24 MED ORDER — ALBUTEROL SULFATE HFA 108 (90 BASE) MCG/ACT IN AERS: 2.0000 | INHALATION_SPRAY | RESPIRATORY_TRACT | Status: DC | PRN

## 2024-04-24 SURGICAL SUPPLY — 49 items
BINDER ABD UNIV 10 28-50 (GAUZE/BANDAGES/DRESSINGS) IMPLANT
BINDER ABD UNIV 12 30-45 (MISCELLANEOUS) IMPLANT
BINDER ABD UNIV 9 30-45 (GAUZE/BANDAGES/DRESSINGS) IMPLANT
BLADE CLIPPER SURG (BLADE) IMPLANT
BLADE SURG 10 STRL SS (BLADE) ×2 IMPLANT
BLADE SURG 11 STRL SS (BLADE) ×1 IMPLANT
BLADE SURG 15 STRL LF DISP TIS (BLADE) IMPLANT
CANISTER SUCT 1200ML W/VALVE (MISCELLANEOUS) ×1 IMPLANT
CHLORAPREP W/TINT 26 (MISCELLANEOUS) ×2 IMPLANT
CLIP APPLIE 9.375 MED OPEN (MISCELLANEOUS) ×1 IMPLANT
COVER BACK TABLE 60X90IN (DRAPES) ×1 IMPLANT
COVER MAYO STAND STRL (DRAPES) ×1 IMPLANT
DERMABOND ADVANCED .7 DNX12 (GAUZE/BANDAGES/DRESSINGS) ×2 IMPLANT
DRAIN CHANNEL 15F RND FF W/TCR (WOUND CARE) IMPLANT
DRAIN CHANNEL 19F RND (DRAIN) IMPLANT
DRAPE TOP ARMCOVERS (MISCELLANEOUS) ×1 IMPLANT
DRAPE U-SHAPE 76X120 STRL (DRAPES) ×1 IMPLANT
DRAPE UTILITY XL STRL (DRAPES) ×1 IMPLANT
ELECT COATED BLADE 2.86 ST (ELECTRODE) IMPLANT
ELECTRODE BLDE 4.0 EZ CLN MEGD (MISCELLANEOUS) IMPLANT
ELECTRODE REM PT RTRN 9FT ADLT (ELECTROSURGICAL) ×1 IMPLANT
EVACUATOR SILICONE 100CC (DRAIN) ×2 IMPLANT
GAUZE PAD ABD 8X10 STRL (GAUZE/BANDAGES/DRESSINGS) ×2 IMPLANT
GAUZE XEROFORM 1X8 LF (GAUZE/BANDAGES/DRESSINGS) IMPLANT
GLOVE BIO SURGEON STRL SZ 6 (GLOVE) ×3 IMPLANT
GOWN STRL REUS W/ TWL LRG LVL3 (GOWN DISPOSABLE) ×2 IMPLANT
NDL HYPO 25X1 1.5 SAFETY (NEEDLE) ×1 IMPLANT
PACK BASIN DAY SURGERY FS (CUSTOM PROCEDURE TRAY) ×1 IMPLANT
PENCIL SMOKE EVACUATOR (MISCELLANEOUS) ×1 IMPLANT
PIN SAFETY STERILE (MISCELLANEOUS) ×1 IMPLANT
SHEET MEDIUM DRAPE 40X70 STRL (DRAPES) ×2 IMPLANT
SLEEVE SCD COMPRESS KNEE MED (STOCKING) ×1 IMPLANT
SOLN 0.9% NACL POUR BTL 1000ML (IV SOLUTION) ×1 IMPLANT
SPONGE T-LAP 18X18 ~~LOC~~+RFID (SPONGE) ×2 IMPLANT
STAPLER SKIN PROX WIDE 3.9 (STAPLE) ×1 IMPLANT
SUT ETHILON 2 0 FS 18 (SUTURE) ×1 IMPLANT
SUT MNCRL AB 3-0 PS2 18 (SUTURE) IMPLANT
SUT MNCRL AB 4-0 PS2 18 (SUTURE) ×2 IMPLANT
SUT PDS AB 0 CT 36 (SUTURE) ×1 IMPLANT
SUT PDS AB 2-0 CT2 27 (SUTURE) IMPLANT
SUT STRATAFIX SPIRAL PDS+ 0 30 (SUTURE) IMPLANT
SUT VICRYL RAPID 5 0 P 3 (SUTURE) IMPLANT
SYR BULB IRRIG 60ML STRL (SYRINGE) ×1 IMPLANT
SYR CONTROL 10ML LL (SYRINGE) ×1 IMPLANT
TOWEL GREEN STERILE FF (TOWEL DISPOSABLE) ×1 IMPLANT
TRAY FOLEY W/BAG SLVR 14FR LF (SET/KITS/TRAYS/PACK) IMPLANT
TUBE CONNECTING 20X1/4 (TUBING) ×1 IMPLANT
UNDERPAD 30X36 HEAVY ABSORB (UNDERPADS AND DIAPERS) ×2 IMPLANT
YANKAUER SUCT BULB TIP NO VENT (SUCTIONS) ×1 IMPLANT

## 2024-04-24 NOTE — Interval H&P Note (Signed)
 History and Physical Interval Note:  04/24/2024 6:52 AM  [name]  has presented today for surgery, with the diagnosis of panniculitis.  The various methods of treatment have been discussed with the patient and family. After consideration of risks, benefits and other options for treatment, the patient has consented to  Procedure(s): PANNICULECTOMY (N/A) as a surgical intervention.  The patient's history has been reviewed, patient examined, no change in status, stable for surgery.  I have reviewed the patient's chart and labs.  Questions were answered to the patient's satisfaction.     Earlis Gurjit Loconte

## 2024-04-24 NOTE — Anesthesia Procedure Notes (Signed)
 Procedure Name: Intubation Date/Time: 04/24/2024 7:43 AM  Performed by: Frost Kayla MATSU, CRNAPre-anesthesia Checklist: Patient identified, Emergency Drugs available, Suction available and Patient being monitored Patient Re-evaluated:Patient Re-evaluated prior to induction Oxygen  Delivery Method: Circle system utilized Preoxygenation: Pre-oxygenation with 100% oxygen  Induction Type: IV induction Ventilation: Mask ventilation without difficulty Laryngoscope Size: Miller and 3 Grade View: Grade I Tube type: Oral Tube size: 7.0 mm Number of attempts: 1 Airway Equipment and Method: Stylet and Oral airway Placement Confirmation: ETT inserted through vocal cords under direct vision, positive ETCO2 and breath sounds checked- equal and bilateral Secured at: 20 cm Tube secured with: Tape Dental Injury: Teeth and Oropharynx as per pre-operative assessment

## 2024-04-24 NOTE — Transfer of Care (Signed)
 Immediate Anesthesia Transfer of Care Note  Patient: [name]  Procedure(s) Performed: PANNICULECTOMY (Abdomen)  Patient Location: PACU  Anesthesia Type:General  Level of Consciousness: drowsy, patient cooperative, and responds to stimulation  Airway & Oxygen  Therapy: Patient Spontanous Breathing and Patient connected to face mask oxygen   Post-op Assessment: Report given to RN and Post -op Vital signs reviewed and stable  Post vital signs: Reviewed and stable  Last Vitals:  Vitals Value Taken Time  BP    Temp    Pulse    Resp    SpO2      Last Pain:  Vitals:   04/24/24 0713  TempSrc: Oral  PainSc: 0-No pain      Patients Stated Pain Goal: 4 (04/24/24 0713)  Complications: No notable events documented.

## 2024-04-24 NOTE — Anesthesia Preprocedure Evaluation (Addendum)
 Anesthesia Evaluation  Patient identified by MRN, date of birth, ID band Patient awake    Reviewed: Allergy & Precautions, NPO status , Patient's Chart, lab work & pertinent test results  History of Anesthesia Complications Negative for: history of anesthetic complications  Airway Mallampati: II  TM Distance: >3 FB Neck ROM: Full    Dental  (+) Dental Advisory Given, Teeth Intact   Pulmonary asthma , sleep apnea , PE   Pulmonary exam normal        Cardiovascular hypertension, Normal cardiovascular exam     Neuro/Psych  Headaches PSYCHIATRIC DISORDERS Anxiety Depression Bipolar Disorder    Panic d/o  Neuromuscular disease    GI/Hepatic Neg liver ROS,GERD  Controlled and Medicated,, S/p gastric bypass    Endo/Other  diabetesHypothyroidism    Renal/GU negative Renal ROS     Musculoskeletal  (+) Arthritis , Rheumatoid disorders,  Fibromyalgia -  Abdominal   Peds  Hematology  On eliquis     Anesthesia Other Findings Hx drug overdose   Reproductive/Obstetrics                              Anesthesia Physical Anesthesia Plan  ASA: 3  Anesthesia Plan: General   Post-op Pain Management: Tylenol  PO (pre-op)*   Induction: Intravenous  PONV Risk Score and Plan: 3 and Treatment may vary due to age or medical condition, Ondansetron , Dexamethasone  and Midazolam   Airway Management Planned: Oral ETT  Additional Equipment: None  Intra-op Plan:   Post-operative Plan: Extubation in OR  Informed Consent: I have reviewed the patients History and Physical, chart, labs and discussed the procedure including the risks, benefits and alternatives for the proposed anesthesia with the patient or authorized representative who has indicated his/her understanding and acceptance.     Dental advisory given  Plan Discussed with: CRNA and Anesthesiologist  Anesthesia Plan Comments:           Anesthesia Quick Evaluation

## 2024-04-24 NOTE — Op Note (Signed)
 Operative Note   DATE OF OPERATION: 12.2.2025  LOCATION: [facility]-observation  SURGICAL DIVISION: Plastic Surgery  PREOPERATIVE DIAGNOSES:  1. Panniculitis 2. History bariatric surgery.  POSTOPERATIVE DIAGNOSES:  same  PROCEDURE:  Panniculectomy  SURGEON: Earlis Ranks MD MBA  ASSISTANT: none  ANESTHESIA:  General.   EBL: 50 ml   COMPLICATIONS: None immediate.   INDICATIONS FOR PROCEDURE:  The patient, [name], is a 58 y.o. female born on 12/08/1965, is here for treatment chronic panniculitis following massive weight loss that has failed conservative measures.   FINDINGS: Abdominal soft tissue resection 751 g  DESCRIPTION OF PROCEDURE:  The patient's caudal incision was marked 7 cm cephalad to anterior fourchette. Incision was extended over lateral abdomen. Patient received subcutaneous heparin  in preoperative area. The patient was taken to the operating room. SCDs were placed and IV antibiotics were given. The patient's operative site was prepped and draped in a sterile fashion. A time out was performed and all information was confirmed to be correct.     Low transverse abdominal incision carried through superficial fascia to abdominal wall. Skin flap elevated in sub Scarpa's layer, taking care to leave layer of subfascial fat over abdominal wall fascia. Dissection completed toward umbilicus. Umbilicus sharply incised and scissor dissection completed to free from abdominal skin flap. Additional dissection completed in midline toward xiphoid. Wound irrigated and hemostasis obtained. Local anesthetic infiltrated. Caudal extent skin excision marked by palpation. This retained the periumbilical skin.  Area marked excised. 15 Fr JP placed in subcutaneous right and left abdomen and secured with 2-0 nylon. Superiorly based U shaped skin flap incised for delivery umbilicus. Low transverse abdominal skin incision closed with 0 PDS interrupted in superficial fascia. 0  Strattafix used to close dermis and 4-0 monocryl used for subcuticular skin closure. In midline, peri umbilical skin closed primarily in midline creating small T shape scar. This was completed with interrupted 0 PDS in superficial fascia, interrupted 3-0 and 4-0 monocryl in dermis and subcuticular. Umbilicus inset with 4-0 monocryl in dermis and 5-0 vicryl rapide for skin closure. Xeroform bolster placed within umbilicus. Tissue adhesive applied to low transverse incision.   Dry dressing and abdominal binder applied. The patient was allowed to wake from anesthesia, extubated and taken to the recovery room in satisfactory condition.  SPECIMENS: none  DRAINS: 15 Fr JP in right and left subcutaneous abdomen  Earlis Ranks, MD [facility] Plastic & Reconstructive Surgery  Office/ physician access line after hours [phone]

## 2024-04-24 NOTE — Anesthesia Postprocedure Evaluation (Signed)
 Anesthesia Post Note  Patient: [name]  Procedure(s) Performed: PANNICULECTOMY (Abdomen)     Patient location during evaluation: PACU Anesthesia Type: General Level of consciousness: awake and alert Pain management: pain level controlled Vital Signs Assessment: post-procedure vital signs reviewed and stable Respiratory status: spontaneous breathing, nonlabored ventilation and respiratory function stable Cardiovascular status: stable and blood pressure returned to baseline Anesthetic complications: no   No notable events documented.  Last Vitals:  Vitals:   04/24/24 1145 04/24/24 1200  BP: 115/67 121/82  Pulse: 94 99  Resp: 12 16  Temp:  36.9 C  SpO2: 100% 98%    Last Pain:  Vitals:   04/24/24 1215  TempSrc:   PainSc: 5                  Debby FORBES Like

## 2024-04-25 ENCOUNTER — Encounter (HOSPITAL_BASED_OUTPATIENT_CLINIC_OR_DEPARTMENT_OTHER): Payer: Self-pay | Admitting: Plastic Surgery

## 2024-04-25 NOTE — Progress Notes (Signed)
 No NSAIDS/ibuprofen  until after 1:25pm written on dc paperwork.

## 2024-04-25 NOTE — Discharge Summary (Signed)
 Physician Discharge Summary  Patient ID: [name] MRN: [id number] DOB/AGE: [birth year] 58 y.o.  Admit date: 04/24/2024 Discharge date: 04/25/2024  Admission Diagnoses: Panniculitis  Discharge Diagnoses:  Principal Problem:   Panniculitis   Discharged Condition: stable  Hospital Course: Post operatively patient tolerated diet, was ambulatory with minimal assist and pain controlled. Instructed on bathing and drain care.  Treatments: surgery: panniculectomy 12.2.2025  Discharge Exam: Blood pressure (!) 116/59, pulse 90, temperature 99.1 F (37.3 C), resp. rate 16, height 5' 6 (1.676 m), weight 70.1 kg, SpO2 100%. Incision/Wound: incisions intact dry umbilicus viable drains serosanguinous abdomen soft  Disposition: Discharge disposition: 01-Home or Self Care       Discharge Instructions     Call MD for:  redness, tenderness, or signs of infection (pain, swelling, bleeding, redness, odor or green/yellow discharge around incision site)   Complete by: As directed    Call MD for:  temperature >100.5   Complete by: As directed    Discharge instructions   Complete by: As directed    Ok to remove dressings and shower am 12.4.2025. Soap and water ok, pat incisions dry. No creams or ointments over incisions. Do not let drains dangle in shower, attach to lanyard or similar.Strip and record drains twice daily and bring log to clinic visit.  Abdominal binder or compression garment all other times.  Ok to raise arms above shoulders for bathing and dressing.  No house yard work or exercise until cleared by MD.   [place] not lie flat, keep head elevated on 2-3 pillows and pillow beneath knees. Ambulate bent at hip.   Driving Restrictions   Complete by: As directed    No driving through post op visit.   Lifting restrictions   Complete by: As directed    No lifting > 5-10 lbs until cleared by MD   Resume previous diet   Complete by: As directed       Allergies as of 04/25/2024        Reactions   Bee Venom Anaphylaxis, Rash   Other reaction(s): PRURITUS, RASH   Bioflavonoid Products Anaphylaxis, Itching   walnuts   Cocamide Anaphylaxis   Coconut (cocos Nucifera) Anaphylaxis, Itching   Coconut Fatty Acid Anaphylaxis   Coconut Oil Anaphylaxis, Itching   Food Anaphylaxis, Itching   Walnuts - anaphylaxis, itching   Mixed Ragweed Anaphylaxis, Itching, Rash   Molds & Smuts Anaphylaxis, Itching, Shortness Of Breath   Other Reaction(s): Other (See Comments)   Mushroom Ext Cmplx(shiitake-reishi-mait) Anaphylaxis   Other Anaphylaxis, Hives, Itching   Ragweed Dog and Cat Dander Ragweed    Other reaction(s): Wheezing (ALLERGY/intolerance) Dog and Cat Dander Ragweed   Shellfish Allergy Anaphylaxis, Swelling   Strawberry Extract Anaphylaxis   Ambrosia Artemisiifolia (ragweed) Skin Test    Other Reaction(s): Other (See Comments)   Doxepin     Oxycodone  Itching   Other Reaction(s): Other (See Comments) Other Reaction(s): Unknown   Hydrocodone -acetaminophen  Rash, Itching   Other Reaction(s): Other (See Comments)   Latex Rash        Medication List     STOP taking these medications    lamoTRIgine  150 MG tablet Commonly known as: LAMICTAL    Trintellix 10 MG Tabs tablet Generic drug: vortioxetine HBr       TAKE these medications    ALPRAZolam  0.5 MG tablet Commonly known as: XANAX  Take 1 tablet (0.5 mg total) by mouth 2 (two) times daily as needed for anxiety.   azelastine  0.1 % nasal spray Commonly known  as: ASTELIN  Place 2 sprays into both nostrils daily as needed for allergies. Use in each nostril as directed   BIOTIN  PO Take 1 capsule by mouth daily.   budesonide-formoterol  80-4.5 MCG/ACT inhaler Commonly known as: SYMBICORT Inhale 2 puffs into the lungs as directed.   cetirizine 10 MG tablet Commonly known as: ZYRTEC Take 10 mg by mouth daily.   citalopram  20 MG tablet Commonly known as: CELEXA  Take 20 mg by mouth daily.   Creon  36000-114000 units Cpep capsule Generic drug: lipase/protease/amylase Take 36,000 Units by mouth as directed.   [employer] Acetaminophen  Ex St 500 MG tablet Generic drug: acetaminophen  Take 1,000 mg by mouth every 6 (six) hours as needed.   cyclobenzaprine  10 MG tablet Commonly known as: FLEXERIL  Take 1 tablet (10 mg total) by mouth at bedtime.   diclofenac  Sodium 1 % Gel Commonly known as: VOLTAREN  Apply 4 g topically 4 (four) times daily.   diphenhydrAMINE  25 MG tablet Commonly known as: SOMINEX Take 25 mg by mouth as needed for itching, allergies or sleep.   Emgality  120 MG/ML Soaj Generic drug: Galcanezumab -gnlm INJECT 120 MG INTO THE SKIN EVERY 30 DAYS   EpiPen  2-Pak 0.3 mg/0.3 mL Soaj injection Generic drug: EPINEPHrine  Inject 0.3 mg into the muscle as needed for anaphylaxis.   eszopiclone 1 MG Tabs tablet Commonly known as: LUNESTA Take 1 mg by mouth at bedtime as needed.   fluticasone  50 MCG/ACT nasal spray Commonly known as: FLONASE  Place 1 spray into both nostrils as needed (allergies/runny nose).   hydrochlorothiazide  12.5 MG tablet Commonly known as: HYDRODIURIL  Take 12.5 mg by mouth daily.   ibuprofen  800 MG tablet Commonly known as: ADVIL  Take 800 mg by mouth every 8 (eight) hours as needed.   lidocaine  5 % Commonly known as: LIDODERM  Place 1 patch onto the skin daily as needed (pain).   Linzess  72 MCG capsule Generic drug: linaclotide  Take 72 mcg by mouth daily before breakfast.   Melatonin 10 MG Chew Chew 30 mg by mouth at bedtime as needed (sleep).   montelukast  10 MG tablet Commonly known as: SINGULAIR  Take 10 mg by mouth at bedtime as needed (allergies).   Mounjaro 10 MG/0.5ML Pen Generic drug: tirzepatide Inject 10 mg into the skin once a week. What changed: Another medication with the same name was removed. Continue taking this medication, and follow the directions you see here.   Nucynta  50 MG tablet Generic drug: tapentadol  Take 50 mg  by mouth as needed.   olopatadine  0.1 % ophthalmic solution Commonly known as: PATANOL Place 1 drop into both eyes daily at 6 (six) AM.   oxyCODONE  5 MG immediate release tablet Commonly known as: Roxicodone  Take 1 tablet (5 mg total) by mouth every 6 (six) hours as needed.   pantoprazole  20 MG tablet Commonly known as: PROTONIX  Take 1 tablet (20 mg total) by mouth daily.   polyethylene glycol 17 g packet Commonly known as: MiraLax  Take 17 g by mouth 2 (two) times daily.   potassium chloride  10 MEQ tablet Commonly known as: KLOR-CON  M Take 10 mEq by mouth daily.   prazosin  1 MG capsule Commonly known as: MINIPRESS  Take 1 mg by mouth at bedtime.   Proventil  HFA 108 (90 Base) MCG/ACT inhaler Generic drug: albuterol  Inhale 2 puffs into the lungs every 4 (four) hours as needed for wheezing or shortness of breath.   QUEtiapine  25 MG tablet Commonly known as: SEROQUEL  Take 25 mg by mouth at bedtime.   RANITIDINE 150  MAX STRENGTH PO Take 1 tablet by mouth daily.   sodium phosphate  Enem Place 133 mLs (1 enema total) rectally daily as needed for severe constipation.   triamcinolone cream 0.1 % Commonly known as: KENALOG Apply 1 Application topically 2 (two) times daily.   TROLAMINE SALICYLATE EX Apply 1 Application topically in the morning and at bedtime. What changed: Another medication with the same name was removed. Continue taking this medication, and follow the directions you see here.   UNKNOWN TO PATIENT Caltrate + D3 (600mg  plus 40mcg): Take 1 tablet by mouth once daily.   VITAMIN B-12 PO Take 1 capsule by mouth daily.   VITAMIN D2 PO Take by mouth.   Zavzpret  10 MG/ACT Soln Generic drug: Zavegepant HCl Place 1 spray into the nose daily as needed.        Follow-up Information     Arelia Filippo, MD Follow up in 1 week(s).   Specialty: Plastic Surgery Why: as scheduled Contact information: [address] [place]  [address] [phone]                 Signed: Filippo Arelia 04/25/2024, 7:56 AM

## 2024-04-25 NOTE — Progress Notes (Signed)
 Time last received oxycodone  written on dc paperwork.

## 2024-05-15 ENCOUNTER — Other Ambulatory Visit: Payer: Self-pay | Admitting: *Deleted

## 2024-05-15 MED ORDER — EMGALITY 120 MG/ML ~~LOC~~ SOAJ: SUBCUTANEOUS | 1 refills | Status: AC

## 2024-05-15 NOTE — Telephone Encounter (Signed)
 Former Dr.Ahern patient but saw you Last seen on 03/05/24 Follow up scheduled on 06/19/24   Are you willing to refill?  Rx is pending to response.

## 2024-05-30 ENCOUNTER — Emergency Department (HOSPITAL_COMMUNITY)
Admission: EM | Admit: 2024-05-30 | Discharge: 2024-05-30 | Disposition: A | Discharge status: Home / Self Care | Attending: Emergency Medicine | Admitting: Emergency Medicine

## 2024-05-30 ENCOUNTER — Emergency Department (HOSPITAL_COMMUNITY)

## 2024-05-30 DIAGNOSIS — R109 Unspecified abdominal pain: Secondary | ICD-10-CM | POA: Diagnosis not present

## 2024-05-30 DIAGNOSIS — Z91040 Latex allergy status: Secondary | ICD-10-CM | POA: Diagnosis not present

## 2024-05-30 DIAGNOSIS — G8918 Other acute postprocedural pain: Secondary | ICD-10-CM | POA: Insufficient documentation

## 2024-05-30 DIAGNOSIS — Z9884 Bariatric surgery status: Secondary | ICD-10-CM | POA: Insufficient documentation

## 2024-05-30 LAB — BASIC METABOLIC PANEL WITH GFR
Anion gap: 10 (ref 5–15)
BUN: 15 mg/dL (ref 6–20)
CO2: 28 mmol/L (ref 22–32)
Calcium: 9.5 mg/dL (ref 8.9–10.3)
Chloride: 104 mmol/L (ref 98–111)
Creatinine, Ser: 0.79 mg/dL (ref 0.44–1.00)
GFR, Estimated: >60 mL/min
Glucose, Bld: 83 mg/dL (ref 70–99)
Potassium: 4 mmol/L (ref 3.5–5.1)
Sodium: 141 mmol/L (ref 135–145)

## 2024-05-30 LAB — CBC WITH DIFFERENTIAL/PLATELET
Abs Immature Granulocytes: 0.01 K/uL (ref 0.00–0.07)
Basophils Absolute: 0 K/uL (ref 0.0–0.1)
Basophils Relative: 1 %
Eosinophils Absolute: 0.1 K/uL (ref 0.0–0.5)
Eosinophils Relative: 2 %
HCT: 40.8 % (ref 36.0–46.0)
Hemoglobin: 12.8 g/dL (ref 12.0–15.0)
Immature Granulocytes: 0 %
Lymphocytes Relative: 24 %
Lymphs Abs: 1.3 K/uL (ref 0.7–4.0)
MCH: 28.7 pg (ref 26.0–34.0)
MCHC: 31.4 g/dL (ref 30.0–36.0)
MCV: 91.5 fL (ref 80.0–100.0)
Monocytes Absolute: 0.9 K/uL (ref 0.1–1.0)
Monocytes Relative: 16 %
Neutro Abs: 3.2 K/uL (ref 1.7–7.7)
Neutrophils Relative %: 57 %
Platelets: 271 K/uL (ref 150–400)
RBC: 4.46 MIL/uL (ref 3.87–5.11)
RDW: 13.7 % (ref 11.5–15.5)
WBC: 5.6 K/uL (ref 4.0–10.5)
nRBC: 0 % (ref 0.0–0.2)

## 2024-05-30 MED ORDER — IOHEXOL 300 MG/ML  SOLN
100.0000 mL | Freq: Once | INTRAMUSCULAR | Status: AC | PRN
  Administered 2024-05-30: 100 mL via INTRAVENOUS

## 2024-05-30 MED ORDER — CEPHALEXIN 500 MG PO CAPS: 500.0000 mg | ORAL_CAPSULE | Freq: Two times a day (BID) | ORAL | 0 refills | Status: AC

## 2024-05-30 MED ORDER — MORPHINE SULFATE (PF) 4 MG/ML IV SOLN: 4.0000 mg | Freq: Once | INTRAVENOUS | Status: DC

## 2024-05-30 MED ORDER — MORPHINE SULFATE (PF) 4 MG/ML IV SOLN
4.0000 mg | Freq: Once | INTRAVENOUS | Status: AC
  Administered 2024-05-30: 4 mg via INTRAVENOUS
  Filled 2024-05-30: qty 1

## 2024-05-30 MED ORDER — CEPHALEXIN 500 MG PO CAPS
500.0000 mg | ORAL_CAPSULE | Freq: Once | ORAL | Status: AC
  Administered 2024-05-30: 500 mg via ORAL
  Filled 2024-05-30: qty 1

## 2024-05-30 MED ORDER — FENTANYL CITRATE (PF) 50 MCG/ML IJ SOSY
50.0000 ug | PREFILLED_SYRINGE | Freq: Once | INTRAMUSCULAR | Status: AC
  Administered 2024-05-30: 50 ug via INTRAVENOUS
  Filled 2024-05-30: qty 1

## 2024-05-30 NOTE — Telephone Encounter (Signed)
 Received call from Dr. Garrick in [facility] ED. Last seen by me 1.2.2026. Patient presented for concern infection. Highest temperature in ED 100.1 F. WBC 5.6. CT scan reviewed reported as focal collection in the anterior abdominal wall at the midline, likely abscess, measuring 1.3 x 2.9 cm. Personal review of CT scan fluid collection is superior to umbilicus and not associated with low transverse incision line where there is a known wound. Reviewed ED picture of wound. Recommend empiric antibiotics and follow up in clinic tomorrow. Discussed with Dr.Lockwood fluid collection on CT not associated with low transverse incision. I do not appreciate an indication for inpatient admission. Recommend early follow up tomorrow and discussed with Dr. Garrick anytime 2:15 pm or later to 5 pm in clinic is fine.

## 2024-05-30 NOTE — ED Triage Notes (Signed)
 Patient states she had abdominal surgery done on 12.02.2025 and her incision is infected. Patient complains of pain in her abdominal area, N/V, and is having drainage to incision area. Rates pain 9/10.

## 2024-05-30 NOTE — Discharge Instructions (Signed)
 You can expect to be seen by your surgeon tomorrow.  Please call the office in the morning to ensure that you are going to the right clinic location. Take all medication as prescribed and return here for concerning changes in your condition.

## 2024-05-30 NOTE — ED Provider Notes (Signed)
 " [place] EMERGENCY DEPARTMENT AT [facility] Provider Note   CSN: [id number] Arrival date & time: 05/30/24  1550     Patient presents with: No chief complaint on file.   [name] is a 59 y.o. female.   HPI Patient 1 month out from anterior abdominal surgery now with concern for pain, nausea, chills, open wound. She notes that she has had follow-up with her surgeon, the last 2 weeks ago.  Over the past days she has developed chills, notes purulent discharge from the anterior surgical site.     Prior to Admission medications  Medication Sig Start Date End Date Taking? Authorizing Provider  cephALEXin  (KEFLEX ) 500 MG capsule Take 1 capsule (500 mg total) by mouth 2 (two) times daily for 5 days. 05/30/24 06/04/24 Yes Garrick Charleston, MD  albuterol  (PROVENTIL  HFA) 108 [phone] Base) MCG/ACT inhaler Inhale 2 puffs into the lungs every 4 (four) hours as needed for wheezing or shortness of breath.    [provider]  ALPRAZolam  (XANAX ) 0.5 MG tablet Take 1 tablet (0.5 mg total) by mouth 2 (two) times daily as needed for anxiety. 02/14/24   Millikan, Megan, NP  azelastine  (ASTELIN ) 0.1 % nasal spray Place 2 sprays into both nostrils daily as needed for allergies. Use in each nostril as directed    [provider]  BIOTIN  PO Take 1 capsule by mouth daily.    [provider]  budesonide-formoterol  (SYMBICORT) 80-4.5 MCG/ACT inhaler Inhale 2 puffs into the lungs as directed.    [provider]  cetirizine (ZYRTEC) 10 MG tablet Take 10 mg by mouth daily. 10/20/21   [provider]  citalopram  (CELEXA ) 20 MG tablet Take 20 mg by mouth daily.    [provider]  CREON  36000-114000 units CPEP capsule Take 36,000 Units by mouth as directed. 01/20/23   [provider]  [employer] ACETAMINOPHEN  EX ST 500 MG tablet Take 1,000 mg by mouth every 6 (six) hours as needed. 07/15/23   [provider]  Cyanocobalamin  (VITAMIN B-12 PO) Take  1 capsule by mouth daily.    [provider]  cyclobenzaprine  (FLEXERIL ) 10 MG tablet Take 1 tablet (10 mg total) by mouth at bedtime. 12/12/23   Ines Onetha NOVAK, MD  diclofenac  Sodium (VOLTAREN ) 1 % GEL Apply 4 g topically 4 (four) times daily. 06/04/22   Joshua Domino, DO  diphenhydrAMINE  (SOMINEX) 25 MG tablet Take 25 mg by mouth as needed for itching, allergies or sleep.    [provider]  EPINEPHrine  (EPIPEN  2-PAK) 0.3 mg/0.3 mL IJ SOAJ injection Inject 0.3 mg into the muscle as needed for anaphylaxis.    [provider]  Ergocalciferol  (VITAMIN D2 PO) Take by mouth.    [provider]  eszopiclone (LUNESTA) 1 MG TABS tablet Take 1 mg by mouth at bedtime as needed. 10/21/23   [provider]  fluticasone  (FLONASE ) 50 MCG/ACT nasal spray Place 1 spray into both nostrils as needed (allergies/runny nose). 06/04/22   Joshua Domino, DO  Galcanezumab -gnlm (EMGALITY ) 120 MG/ML SOAJ INJECT 120 MG INTO THE SKIN EVERY 30 DAYS 05/15/24   Millikan, Megan, NP  hydrochlorothiazide  (HYDRODIURIL ) 12.5 MG tablet Take 12.5 mg by mouth daily.    [provider]  ibuprofen  (ADVIL ) 800 MG tablet Take 800 mg by mouth every 8 (eight) hours as needed. 06/11/23   [provider]  lidocaine  (LIDODERM ) 5 % Place 1 patch onto the skin daily as needed (pain).    [provider]  LINZESS  72 MCG capsule Take 72 mcg by mouth daily before breakfast.    [provider]  Melatonin 10 MG CHEW Chew 30 mg by mouth at bedtime as needed (sleep).    [provider]  montelukast  (SINGULAIR ) 10 MG tablet Take 10 mg by mouth at bedtime as needed (allergies). 08/07/19   [provider]  MOUNJARO 10 MG/0.5ML Pen Inject 10 mg into the skin once a week. 09/24/23   [provider]  olopatadine  (PATANOL) 0.1 % ophthalmic solution Place 1 drop into both eyes daily at 6 (six) AM.    [provider]  oxyCODONE  (ROXICODONE ) 5 MG immediate  release tablet Take 1 tablet (5 mg total) by mouth every 6 (six) hours as needed. 04/24/24 04/24/25  Arelia Filippo, MD  pantoprazole  (PROTONIX ) 20 MG tablet Take 1 tablet (20 mg total) by mouth daily. 04/21/23   Neysa Caron PARAS, DO  polyethylene glycol (MIRALAX ) 17 g packet Take 17 g by mouth 2 (two) times daily. 02/20/24   Randol Simmonds, MD  potassium chloride  (KLOR-CON  M) 10 MEQ tablet Take 10 mEq by mouth daily.    [provider]  prazosin  (MINIPRESS ) 1 MG capsule Take 1 mg by mouth at bedtime. 02/11/23   [provider]  QUEtiapine  (SEROQUEL ) 25 MG tablet Take 25 mg by mouth at bedtime. 11/18/23   [provider]  raNITIdine HCl (RANITIDINE 150 MAX STRENGTH PO) Take 1 tablet by mouth daily.    [provider]  sodium phosphate  (FLEET) ENEM Place 133 mLs (1 enema total) rectally daily as needed for severe constipation. 02/20/24   Randol Simmonds, MD  tapentadol  (NUCYNTA ) 50 MG tablet Take 50 mg by mouth as needed. 06/02/23   [provider]  triamcinolone cream (KENALOG) 0.1 % Apply 1 Application topically 2 (two) times daily. 07/11/18   [provider]  TROLAMINE SALICYLATE EX Apply 1 Application topically in the morning and at bedtime.    [provider]  UNKNOWN TO PATIENT Caltrate + D3 (600mg  plus 40mcg): Take 1 tablet by mouth once daily.    [provider]  Zavegepant HCl (ZAVZPRET ) 10 MG/ACT SOLN Place 1 spray into the nose daily as needed. 12/12/23   Ines Onetha NOVAK, MD    Allergies: Bee venom, Bioflavonoid products, Cocamide, Coconut (cocos nucifera), Coconut fatty acid, Coconut oil, Food, Mixed ragweed, Molds & smuts, Mushroom ext cmplx(shiitake-reishi-mait), Other, Shellfish allergy, Strawberry extract, Ambrosia artemisiifolia (ragweed) skin test, Doxepin , Oxycodone , Hydrocodone -acetaminophen , and Latex    Review of Systems  Updated Vital Signs BP 110/70   Pulse 88   Temp 100.1 F (37.8 C) (Oral)   Resp 16   Ht 1.676 m  (5' 6)   Wt 70.1 kg   SpO2 100%   BMI 24.95 kg/m   Physical Exam Vitals and nursing note reviewed.  Constitutional:      General: She is not in acute distress.    Appearance: She is well-developed.  HENT:     Head: Normocephalic and atraumatic.  Eyes:     Conjunctiva/sclera: Conjunctivae normal.  Cardiovascular:     Rate and Rhythm: Normal rate and regular rhythm.  Pulmonary:     Effort: Pulmonary effort is normal. No respiratory distress.     Breath sounds: No stridor.  Abdominal:     General: There is no distension.   Skin:    General: Skin is warm and dry.  Neurological:     Mental Status: She is alert and oriented to  person, place, and time.     Cranial Nerves: No cranial nerve deficit.  Psychiatric:        Mood and Affect: Mood normal.     (all labs ordered are listed, but only abnormal results are displayed) Labs Reviewed  AEROBIC CULTURE W GRAM STAIN (SUPERFICIAL SPECIMEN)  BASIC METABOLIC PANEL WITH GFR  CBC WITH DIFFERENTIAL/PLATELET    EKG: None  Radiology: CT ABDOMEN PELVIS W CONTRAST Result Date: 05/30/2024 EXAM: CT ABDOMEN AND PELVIS WITH CONTRAST 05/30/2024 07:13:43 PM TECHNIQUE: CT of the abdomen and pelvis was performed with the administration of 100 mL of iohexol  (OMNIPAQUE ) 300 MG/ML solution. Multiplanar reformatted images are provided for review. Automated exposure control, iterative reconstruction, and/or weight-based adjustment of the mA/kV was utilized to reduce the radiation dose to as low as reasonably achievable. COMPARISON: Comparison with 02/20/2024. CLINICAL HISTORY: Abdominal pain, acute, nonlocalized; post-op w worsening pain and open surgical site anteriorly. Abdominal surgery on 04/24/2024 with infected incision. Abdominal pain with nausea and vomiting. FINDINGS: LOWER CHEST: Mild dependent atelectasis in the lung bases. LIVER: Mild diffuse fatty infiltration of the liver. GALLBLADDER AND BILE DUCTS: Surgical absence of the gallbladder. No  bile duct dilatation. SPLEEN: Spleen is unremarkable. PANCREAS: Pancreas is unremarkable. ADRENAL GLANDS: No adrenal gland nodules. KIDNEYS, URETERS AND BLADDER: Kidneys are symmetrical. No stones in the kidneys or ureters. No hydronephrosis or hydroureter. No perinephric or periureteral stranding. Urinary bladder is unremarkable. GI AND BOWEL: Postoperative changes consistent with gastric bypass. Stomach and small bowel are not abnormally distended. Small bowel loops are dilated and fluid-filled with wall and fold thickening, suggesting enteritis. Early or partial small bowel obstruction could also possibly have this appearance. Stool-filled colon without abnormal distention. The appendix is normal. PERITONEUM AND RETROPERITONEUM: No free air or free fluid in the abdomen. VASCULATURE: Normal caliber abdominal aorta. LYMPH NODES: No retroperitoneal lymphadenopathy. REPRODUCTIVE ORGANS: The uterus is absent. No abnormal adnexal masses. BONES AND SOFT TISSUES: Surgical clips in the anterior abdominal wall with soft tissue stranding. Focal collection in the anterior abdominal wall at the midline measures 1.3 x 2.9 cm in diameter, likely abscess. Degenerative changes in the spine. No acute osseous abnormality. IMPRESSION: 1. Focal collection in the anterior abdominal wall at the midline, likely abscess, measuring 1.3 x 2.9 cm. 2. Dilated, fluid-filled small bowel loops with wall and fold thickening, suggesting enteritis. Early or partial small bowel obstruction could also possibly have this appearance. Electronically signed by: Elsie Gravely MD 05/30/2024 07:22 PM EST RP Workstation: [id number]     Procedures   Medications Ordered in the ED  cephALEXin  (KEFLEX ) capsule 500 mg (has no administration in time range)  fentaNYL  (SUBLIMAZE ) injection 50 mcg (50 mcg Intravenous Given 05/30/24 1751)  iohexol  (OMNIPAQUE ) 300 MG/ML solution 100 mL (100 mLs Intravenous Contrast Given 05/30/24 1859)                                     Medical Decision Making Adult female 1 month status post abdominal surgery now with open wound, pain, nausea, chills.  Concern for infection, dehiscence, CT labs analgesia all ordered.   Amount and/or Complexity of Data Reviewed External Data Reviewed: notes.    Details: Surgical follow-up note included below Labs: ordered. Decision-making details documented in ED Course. Radiology: ordered and independent interpretation performed. Decision-making details documented in ED Course.  Risk Prescription drug management. Decision regarding hospitalization. Diagnosis or treatment significantly limited by social  determinants of health.     8:11 PM  On repeat exam patient in no distress..  Labs reviewed unremarkable, no leukocytosis.  She has slight elevation in temperature, no true fever, otherwise hemodynamically unremarkable.  I have reviewed the CT imaging, discussed them with the patient's surgeon who is also reviewed the imaging. Fluid collection identified on CT is well above the umbilicus, not in the area of surgical concern there is no evidence for deep space infection around the surgical site.  With concern for possible small cutaneous changes patient will start Keflex , follow-up next day has been arranged.   Postop evaluation note 1, 2, 26 [facility] included below: HPI 1 mo post op. Since last exam reports flu. Reports onset wound abdomen, current wound care none per patient. Patient reports she feels like she has been butchered. Reports she thought the incision would be straight. Asking why belly button not centered. Reports dizziness pain nausea. Highest weight  269 lb. 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 Lowest weight following this 239 lb. Regained some of this weight. She has had over 100 lb wt loss  on Mounjaro.  Abdominal soft tissue resection Abdominal soft tissue resection 751 g PMH significant for PE diagnosed 05/2022. Patient states she has been  off Eliquis  since November 2024. She had hypercoaguable work up that was negative. PMH significant for Graves Bipolar II fibromyalgia chronic pain on pain contract with [facility] and on Nucynta . Bipolar managed by Behavior[facility].  Disabled secondary to above. Lives with adult son. Review of Systems Objective Physical Exam Abd incisions intact with exception midline superficial wound without undermining or tunneling excised slough post debridement 1.5 x 3.2 x 0.5 cm wound No cellulitis no seroma Umbilicus to left of center Assessment/Plan S/p gastric bypass Panniculitis s/p panniculectomy Hx PE off Elquis [employer] today. Counseled her the wound is superficial no evidence infection today and will heal on own. Recommend Aquaphor daily and PRN. Provided sample. Recommend leave binder off and open to air. With exception swimming or soaking may increase activities as tolerated. Counseled patient her surgical site is without infection and if she continues with n/v/fevers needs to see PCP. I agree her umbilicus is left of midline. This would require additional surgery in future once healed to correct.       Final diagnoses:  Post-operative pain    ED Discharge Orders          Ordered    cephALEXin  (KEFLEX ) 500 MG capsule  2 times daily        05/30/24 2011               Garrick Charleston, MD 05/30/24 2011  "

## 2024-06-02 LAB — AEROBIC CULTURE W GRAM STAIN (SUPERFICIAL SPECIMEN)

## 2024-06-03 ENCOUNTER — Telehealth (HOSPITAL_BASED_OUTPATIENT_CLINIC_OR_DEPARTMENT_OTHER): Payer: Self-pay | Admitting: *Deleted

## 2024-06-03 NOTE — Progress Notes (Signed)
 ED Antimicrobial Stewardship Positive Culture Follow Up   [name] is an 59 y.o. female who presented to [facility] on 05/30/2024 with a chief complaint of No chief complaint on file.   Recent Results (from the past 720 hours)  Aerobic Culture w Gram Stain (superficial specimen)     Status: None   Collection Time: 05/30/24  5:56 PM   Specimen: Wound  Result Value Ref Range Status   Specimen Description   Final    WOUND Performed at [facility], [address] [place], [place] [address]    Special Requests   Final    NONE Performed at [facility], [address], [place], [place] [address]    Gram Stain   Final    RARE WBC PRESENT, PREDOMINANTLY PMN FEW GRAM POSITIVE COCCI FEW GRAM NEGATIVE RODS FEW GRAM POSITIVE RODS Performed at [facility], [address] [place], [place] [address]    Culture   Final    MODERATE PSEUDOMONAS AERUGINOSA FEW KLEBSIELLA PNEUMONIAE FEW ENTEROBACTER CLOACAE    Report Status 06/02/2024 FINAL  Final   Organism ID, Bacteria PSEUDOMONAS AERUGINOSA  Final   Organism ID, Bacteria KLEBSIELLA PNEUMONIAE  Final   Organism ID, Bacteria ENTEROBACTER CLOACAE  Final      Susceptibility   Enterobacter cloacae - MIC*    CEFEPIME <=0.12 SENSITIVE Sensitive     ERTAPENEM <=0.12 SENSITIVE Sensitive     CIPROFLOXACIN  <=0.06 SENSITIVE Sensitive     GENTAMICIN <=1 SENSITIVE Sensitive     MEROPENEM <=0.25 SENSITIVE Sensitive     TRIMETH/SULFA >=320 RESISTANT Resistant     PIP/TAZO Value in next row Sensitive      <=4 SENSITIVEThis is a modified FDA-approved test that has been validated and its performance characteristics determined by the reporting laboratory.  This laboratory is certified under the Clinical Laboratory Improvement Amendments CLIA as qualified to perform high complexity clinical laboratory testing.    * FEW ENTEROBACTER CLOACAE   Klebsiella pneumoniae - MIC*    AMPICILLIN Value in next row  Resistant      <=4 SENSITIVEThis is a modified FDA-approved test that has been validated and its performance characteristics determined by the reporting laboratory.  This laboratory is certified under the Clinical Laboratory Improvement Amendments CLIA as qualified to perform high complexity clinical laboratory testing.    CEFAZOLIN  (NON-URINE) Value in next row Sensitive      <=4 SENSITIVEThis is a modified FDA-approved test that has been validated and its performance characteristics determined by the reporting laboratory.  This laboratory is certified under the Clinical Laboratory Improvement Amendments CLIA as qualified to perform high complexity clinical laboratory testing.    CEFEPIME Value in next row Sensitive      <=4 SENSITIVEThis is a modified FDA-approved test that has been validated and its performance characteristics determined by the reporting laboratory.  This laboratory is certified under the Clinical Laboratory Improvement Amendments CLIA as qualified to perform high complexity clinical laboratory testing.    ERTAPENEM Value in next row Sensitive      <=4 SENSITIVEThis is a modified FDA-approved test that has been validated and its performance characteristics determined by the reporting laboratory.  This laboratory is certified under the Clinical Laboratory Improvement Amendments CLIA as qualified to perform high complexity clinical laboratory testing.    CEFTRIAXONE  Value in next row Sensitive      <=4 SENSITIVEThis is a modified FDA-approved test that has been validated and its performance characteristics determined  by the reporting laboratory.  This laboratory is certified under the Clinical Laboratory Improvement Amendments CLIA as qualified to perform high complexity clinical laboratory testing.    CIPROFLOXACIN  Value in next row Sensitive      <=4 SENSITIVEThis is a modified FDA-approved test that has been validated and its performance characteristics determined by the reporting  laboratory.  This laboratory is certified under the Clinical Laboratory Improvement Amendments CLIA as qualified to perform high complexity clinical laboratory testing.    GENTAMICIN Value in next row Sensitive      <=4 SENSITIVEThis is a modified FDA-approved test that has been validated and its performance characteristics determined by the reporting laboratory.  This laboratory is certified under the Clinical Laboratory Improvement Amendments CLIA as qualified to perform high complexity clinical laboratory testing.    MEROPENEM Value in next row Sensitive      <=4 SENSITIVEThis is a modified FDA-approved test that has been validated and its performance characteristics determined by the reporting laboratory.  This laboratory is certified under the Clinical Laboratory Improvement Amendments CLIA as qualified to perform high complexity clinical laboratory testing.    TRIMETH/SULFA Value in next row Sensitive      <=4 SENSITIVEThis is a modified FDA-approved test that has been validated and its performance characteristics determined by the reporting laboratory.  This laboratory is certified under the Clinical Laboratory Improvement Amendments CLIA as qualified to perform high complexity clinical laboratory testing.    AMPICILLIN/SULBACTAM Value in next row Sensitive      <=4 SENSITIVEThis is a modified FDA-approved test that has been validated and its performance characteristics determined by the reporting laboratory.  This laboratory is certified under the Clinical Laboratory Improvement Amendments CLIA as qualified to perform high complexity clinical laboratory testing.    PIP/TAZO Value in next row Sensitive      <=4 SENSITIVEThis is a modified FDA-approved test that has been validated and its performance characteristics determined by the reporting laboratory.  This laboratory is certified under the Clinical Laboratory Improvement Amendments CLIA as qualified to perform high complexity clinical laboratory  testing.    * FEW KLEBSIELLA PNEUMONIAE   Pseudomonas aeruginosa - MIC*    MEROPENEM Value in next row Sensitive      <=4 SENSITIVEThis is a modified FDA-approved test that has been validated and its performance characteristics determined by the reporting laboratory.  This laboratory is certified under the Clinical Laboratory Improvement Amendments CLIA as qualified to perform high complexity clinical laboratory testing.    CIPROFLOXACIN  Value in next row Sensitive      <=4 SENSITIVEThis is a modified FDA-approved test that has been validated and its performance characteristics determined by the reporting laboratory.  This laboratory is certified under the Clinical Laboratory Improvement Amendments CLIA as qualified to perform high complexity clinical laboratory testing.    IMIPENEM Value in next row Sensitive      <=4 SENSITIVEThis is a modified FDA-approved test that has been validated and its performance characteristics determined by the reporting laboratory.  This laboratory is certified under the Clinical Laboratory Improvement Amendments CLIA as qualified to perform high complexity clinical laboratory testing.    PIP/TAZO Value in next row Sensitive      8 SENSITIVEThis is a modified FDA-approved test that has been validated and its performance characteristics determined by the reporting laboratory.  This laboratory is certified under the Clinical Laboratory Improvement Amendments CLIA as qualified to perform high complexity clinical laboratory testing.    CEFEPIME Value in next row Sensitive  8 SENSITIVEThis is a modified FDA-approved test that has been validated and its performance characteristics determined by the reporting laboratory.  This laboratory is certified under the Clinical Laboratory Improvement Amendments CLIA as qualified to perform high complexity clinical laboratory testing.    CEFTAZIDIME/AVIBACTAM Value in next row Sensitive      8 SENSITIVEThis is a modified FDA-approved  test that has been validated and its performance characteristics determined by the reporting laboratory.  This laboratory is certified under the Clinical Laboratory Improvement Amendments CLIA as qualified to perform high complexity clinical laboratory testing.    CEFTOLOZANE/TAZOBACTAM Value in next row Sensitive      8 SENSITIVEThis is a modified FDA-approved test that has been validated and its performance characteristics determined by the reporting laboratory.  This laboratory is certified under the Clinical Laboratory Improvement Amendments CLIA as qualified to perform high complexity clinical laboratory testing.    TOBRAMYCIN Value in next row Sensitive      8 SENSITIVEThis is a modified FDA-approved test that has been validated and its performance characteristics determined by the reporting laboratory.  This laboratory is certified under the Clinical Laboratory Improvement Amendments CLIA as qualified to perform high complexity clinical laboratory testing.    CEFTAZIDIME Value in next row Sensitive      8 SENSITIVEThis is a modified FDA-approved test that has been validated and its performance characteristics determined by the reporting laboratory.  This laboratory is certified under the Clinical Laboratory Improvement Amendments CLIA as qualified to perform high complexity clinical laboratory testing.    * MODERATE PSEUDOMONAS AERUGINOSA    [x]  Treated with Keflex , organism resistant to prescribed antimicrobial. She was already prescribed Cipro  on 1/9 by Dr. Arelia.  Organisms sensitive to cipro .  No further changes needed.     ED Provider: Candelario Kidney, PA-C  Wanda Hasting PharmD, BCPS [facility] main pharmacy [phone] 06/03/2024 12:24 PM

## 2024-06-03 NOTE — Telephone Encounter (Signed)
 Post ED Visit - Positive Culture Follow-up  Culture report reviewed by antimicrobial stewardship pharmacist: [facility] []  Rankin Dee, Pharm.D. []  Venetia Gully, Pharm.D., BCPS AQ-ID []  Garrel Crews, Pharm.D., BCPS []  Almarie Lunger, [address].D., BCPS []  [place], [address].D., BCPS, AAHIVP []  Rosaline Bihari, Pharm.D., BCPS, AAHIVP []  Vernell Meier, PharmD, BCPS []  Latanya Hint, PharmD, BCPS []  Donald Medley, PharmD, BCPS []  Rocky Bold, PharmD []  Dorothyann Alert, PharmD, BCPS []  Morene Babe, PharmD  [facility] []  Rosaline Edison, PharmD []  Romona Bliss, PharmD []  Dolphus Roller, PharmD []  Veva Seip, Rph []  Vernell Daunt) Leonce, PharmD []  Eva Allis, PharmD []  Rosaline Millet, PharmD []  Iantha Batch, PharmD []  Arvin Gauss, PharmD [x]  Wanda Hasting, PharmD []  Ronal Rav, PharmD []  Rocky Slade, PharmD []  Bard Jeans, PharmD   Positive aerobic culture Treated with Keflex , organism resistant to prescribed antimicrobial. She was already prescribed Cipro  on 1/9 by Dr. Arelia.  Organisms sensitive to cipro .  No further changes needed.      [name] 06/03/2024, 1:06 PM

## 2024-06-08 ENCOUNTER — Other Ambulatory Visit: Payer: Self-pay

## 2024-06-18 NOTE — Progress Notes (Unsigned)
 "  [facility]  PATIENT: [name] DOB: Dec 22, 1965  REFERRING DOCTOR OR PCP: Santana Molt, NP SOURCE: Patient, note from neurology, imaging or lab reports, imaging studies personally reviewed  _________________________________   HISTORICAL  CHIEF COMPLAINT:  Chief Complaint  Patient presents with   Migraine    Rm10, alone, Pt is here for migraine follow up: 11/30 days ailed w/migraines    HISTORY OF PRESENT ILLNESS:  [name] is a 59 y.o. woman with migraine headaches.  She had been seeing Dr. Ines and is transferring care  She reports headaches about 10-15 days a month.    She is currently on Botox  and Emgality .  She denies aura.    HA has moderately severe to severe intensity, no medication overuse. Migraine last 24-72 hours. Migraines are pressure on the left, pounding, throbbing, light sensitivity, sound sensitivity,nausea, movement makes it worse.  Acute therapies tried: She has failed imitrex  and maxalt, side effects.  Tylenol  and Ibu have not helped.  She is on Nucynta  form her Pain management doctor and that helps some.    Zavegepant helped the most but has not taken in a while.   Nurtec also helped.    In the past toradol  has helped for refractory HA   Chronic medications tried: topamax , aimovig (caused hypertension), flexeril , depakote , gabapentin , robaxin , verapamil, tizanidine  imitrex , paxil  were either ineffective or poorly tolerated.  Currently on Emgality  and Botox  injections with some benefit..  Last injection 03/05/2024.  She currently has a HA.  The headache is located bilaterally including the occiput it is worse if she moves her head she has mild photophobia.   Imaging: CT scan of the head 10/28/2023 was normal   REVIEW OF SYSTEMS: Constitutional: No fevers, chills, sweats, or change in appetite Eyes: No visual changes, double vision, eye pain Ear, nose and throat: No hearing loss, ear pain, nasal congestion, sore  throat Cardiovascular: No chest pain, palpitations Respiratory:  No shortness of breath at rest or with exertion.   No wheezes GastrointestinaI: No nausea, vomiting, diarrhea, abdominal pain, fecal incontinence Genitourinary:  No dysuria, urinary retention or frequency.  No nocturia. Musculoskeletal:  No neck pain, back pain Integumentary: No rash, pruritus, skin lesions Neurological: as above Psychiatric: No depression at this time.  No anxiety Endocrine: No palpitations, diaphoresis, change in appetite, change in weigh or increased thirst Hematologic/Lymphatic:  No anemia, purpura, petechiae. Allergic/Immunologic: No itchy/runny eyes, nasal congestion, recent allergic reactions, rashes  ALLERGIES: Allergies[1]  HOME MEDICATIONS: Current Medications[2]  PAST MEDICAL HISTORY: Past Medical History:  Diagnosis Date   Acute kidney injury 10/25/2020   Acute respiratory disease due to COVID-19 virus 10/25/2020   AKI (acute kidney injury) 10/25/2020   Allergic rhinitis    Allergic to cats    pet dander   Allergy    Anemia    Anxiety    Arthritis 09/2018   both feet   Asthma    Back pain    Bipolar disorder (HCC)    Cervicalgia    Chronic fatigue syndrome    Chronic low back pain with left-sided sciatica    Class 3 severe obesity with serious comorbidity and body mass index (BMI) of 40.0 to 44.9 in adult [facility]) 11/14/2014   Constipation    Depression    Diabetes mellitus without complication (HCC)    pre   Drug overdose, multiple drugs 11/01/2012   Dyspnea    Environmental allergies    Fibromyalgia    GERD (gastroesophageal reflux disease)  Grave's disease    Heart murmur    High cholesterol    History of blood clots    Hypertension    Joint pain    Lactose intolerance    Lower extremity edema    Multiple food allergies    OSA (obstructive sleep apnea)    Osteoporosis    Palpitations    Panic disorder    PE (pulmonary thromboembolism) (HCC)    on Eliquis     Pharyngoesophageal dysphagia 06/24/2014   Prediabetes 05/11/2019   Protein-calorie malnutrition, mild 06/23/2021   Pulmonary embolism (HCC) 2024   Rheumatoid arthritis (HCC)    Risk for falls    Sciatica    Severe 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 (HCC)    Sleep apnea    no cpap worn   Subclinical hypothyroidism 05/11/2019   Syncope    Syncope and collapse    Thyroid  disease    Vasovagal syncope    Vertigo    Vitamin D  deficiency     PAST SURGICAL HISTORY: Past Surgical History:  Procedure Laterality Date   ABDOMINAL HYSTERECTOMY  2006   BREAST BIOPSY Left 05/30/2023   MM LT BREAST BX W LOC DEV 1ST LESION IMAGE BX SPEC STEREO GUIDE 05/30/2023 [facility]   CHOLECYSTECTOMY N/A 10/04/2022   Procedure: LAPAROSCOPIC CHOLECYSTECTOMY WITH INTRAOPERATIVE CHOLANGIOGRAM;  Surgeon: Belinda Cough, MD;  Location: [facility];  Service: General;  Laterality: N/A;   COLONOSCOPY     GASTRIC BYPASS  2008   PANNICULECTOMY N/A 04/24/2024   Procedure: PANNICULECTOMY;  Surgeon: Arelia Filippo, MD;  Location: [facility];  Service: Plastics;  Laterality: N/A;    FAMILY HISTORY: Family History  Problem Relation Age of Onset   Hypertension Mother    Cirrhosis Mother    Alcohol abuse Mother    Depression Mother    Physical abuse Mother    Hyperlipidemia Mother    Thyroid  disease Mother    Anxiety disorder Mother    Arthritis Mother    Hypertension Father    Alcohol abuse Father    Hyperlipidemia Father    Depression Father    Drug abuse Father    Arthritis Father    COPD Father    Gout Father    Hypertension Sister    Alcohol abuse Sister    Drug abuse Sister    Depression Sister    Anxiety disorder Sister    OCD Sister    Thyroid  disease Sister    Depression Sister    Thyroid  disease Sister    Stroke Maternal Grandmother    Heart attack Maternal Grandmother    Diabetes Maternal Uncle    Stroke Maternal Uncle    Colon cancer Maternal  Uncle    Seizures Cousin    Breast cancer Cousin    Breast cancer Cousin    ADD / ADHD Other    Diabetes Other    Hypertension Other    Dementia Neg Hx    Esophageal cancer Neg Hx    Rectal cancer Neg Hx    Stomach cancer Neg Hx     SOCIAL HISTORY: Social History   Socioeconomic History   Marital status: Divorced    Spouse name: Not on file   Number of children: 2   Years of education: 14   Highest education level: Not on file  Occupational History   Occupation: Disabled  Tobacco Use   Smoking status: Never   Smokeless tobacco: Never  Vaping Use   Vaping status:  Never Used  Substance and Sexual Activity   Alcohol use: Not Currently    Comment: 1-2 drinks/month   Drug use: No   Sexual activity: Not on file  Other Topics Concern   Not on file  Social History Narrative   Son lives with her. Caffeine use: 1 cup/day of soda   Social Drivers of Health   Tobacco Use: Low Risk (06/19/2024)   Patient History    Smoking Tobacco Use: Never    Smokeless Tobacco Use: Never    Passive Exposure: Not on file  Financial Resource Strain: Not on file  Food Insecurity: Medium Risk (11/30/2023)   Received from [facility]   Epic    Within the past 12 months, you worried that your food would run out before you got money to buy more: Sometimes true    Within the past 12 months, the food you bought just didn't last and you didn't have money to get more. : Sometimes true  Transportation Needs: No Transportation Needs (11/30/2023)   Received from [facility]    In the past 12 months, has lack of reliable transportation kept you from medical appointments, meetings, work or from getting things needed for daily living? : No  Recent Concern: Transportation Needs - Unmet Transportation Needs (11/16/2023)   Received from [facility]    In the past 12 months, has lack of reliable transportation kept you from medical appointments, meetings, work or from  getting things needed for daily living? : Yes  Physical Activity: Not on file  Stress: Not on file  Social Connections: Not on file  Intimate Partner Violence: Not At Risk (10/02/2022)   Humiliation, Afraid, Rape, and Kick questionnaire    Fear of Current or Ex-Partner: No    Emotionally Abused: No    Physically Abused: No    Sexually Abused: No  Depression (PHQ2-9): High Risk (11/11/2022)   Depression (PHQ2-9)    PHQ-2 Score: 17  Alcohol Screen: Not on file  Housing: Medium Risk (11/30/2023)   Received from [facility]   Epic    What is your living situation today?: I have a steady place to live    Think about the place you live. Do you have problems with any of the following? Choose all that apply:: Lead paint or pipes;Pests such as bugs, ants, or mice;Water leaks  Utilities: Medium Risk (11/30/2023)   Received from [facility]   Utilities    In the past 12 months has the electric, gas, oil, or water company threatened to shut off services in your home? : Yes  Health Literacy: Not on file       PHYSICAL EXAM  Vitals:   06/19/24 1351  BP: 108/73  Pulse: 96  Resp: 14  Weight: 154 lb (69.9 kg)  Height: 5' 6 (1.676 m)    Body mass index is 24.86 kg/m.   General: The patient is well-developed and well-nourished and in no acute distress  HEENT:  Head is [place]/AT.  Sclera are anicteric.  Funduscopic exam shows normal optic discs and retinal vessels.  Neck: No carotid bruits are noted.  The neck is tender bilaterally at the occiput.  Range of motion appears normal.  Cardiovascular: The heart has a regular rate and rhythm with a normal S1 and S2. There were no murmurs, gallops or rubs.    Skin: Extremities are without rash or  edema.  Musculoskeletal:  Back is nontender  Neurologic Exam  Mental  status: The patient is alert and oriented x 3 at the time of the examination. The patient has apparent normal recent and remote memory, with an apparently normal attention span  and concentration ability.   Speech is normal.  Cranial nerves: Extraocular movements are full. Pupils are equal, round, and reactive to light and accomodation.  Visual fields are full.  Facial symmetry is present. There is good facial sensation to soft touch bilaterally.Facial strength is normal.  Trapezius and sternocleidomastoid strength is normal. No dysarthria is noted.  The tongue is midline, and the patient has symmetric elevation of the soft palate. No obvious hearing deficits are noted.  Motor:  Muscle bulk is normal.   Tone is normal. Strength is  5 / 5 in all 4 extremities.   Sensory: Sensory testing is intact to pinprick, soft touch and vibration sensation in all 4 extremities.  Coordination: Cerebellar testing reveals good finger-nose-finger and heel-to-shin bilaterally.  Gait and station: Station is normal.   Gait is arthritic.  Tandem gait is mildly wide.. Romberg is negative.   Reflexes: Deep tendon reflexes are symmetric and normal bilaterally.   Plantar responses are flexor.    DIAGNOSTIC DATA (LABS, IMAGING, TESTING) - I reviewed patient records, labs, notes, testing and imaging myself where available.  Lab Results  Component Value Date   WBC 5.6 05/30/2024   HGB 12.8 05/30/2024   HCT 40.8 05/30/2024   MCV 91.5 05/30/2024   PLT 271 05/30/2024      Component Value Date/Time   NA 141 05/30/2024 1756   NA 143 07/09/2020 1536   K 4.0 05/30/2024 1756   CL 104 05/30/2024 1756   CO2 28 05/30/2024 1756   GLUCOSE 83 05/30/2024 1756   BUN 15 05/30/2024 1756   BUN 19 07/09/2020 1536   CREATININE 0.79 05/30/2024 1756   CREATININE 0.96 08/03/2021 1352   CREATININE 0.85 06/12/2014 2031   CALCIUM  9.5 05/30/2024 1756   PROT 6.9 02/20/2024 0945   PROT 7.0 07/09/2020 1536   ALBUMIN 4.4 02/20/2024 0945   ALBUMIN 4.2 07/09/2020 1536   AST 35 02/20/2024 0945   AST 12 (L) 08/03/2021 1352   ALT 27 02/20/2024 0945   ALT 14 08/03/2021 1352   ALKPHOS 78 02/20/2024 0945    BILITOT <0.2 02/20/2024 0945   BILITOT 0.3 08/03/2021 1352   GFRNONAA >60 05/30/2024 1756   GFRNONAA >60 08/03/2021 1352   GFRAA 95 07/09/2020 1536   Lab Results  Component Value Date   CHOL 227 (H) 07/09/2020   HDL 142 07/09/2020   LDLCALC 67 07/09/2020   TRIG 92 10/25/2020   CHOLHDL 1.6 07/09/2020   Lab Results  Component Value Date   HGBA1C 5.3 05/25/2022   Lab Results  Component Value Date   VITAMINB12 808 05/26/2022   Lab Results  Component Value Date   TSH 9.530 (H) 02/20/2024       ASSESSMENT AND PLAN  Chronic migraine without aura, with intractable migraine, so stated, with status migrainosus  Anxiety disorder, unspecified type  Acute nonintractable headache, unspecified headache type  Cervicalgia   In summary, [name] is a 59 year old woman with chronic migraine.  Her migraines have done best on the combination of Botox  with Emgality .  She is due for her next Botox .  We need to get this authorized for her and we will bring her back in the next couple weeks to get the series of injections as before She is having trouble getting Emgality  covered.  Emgality  120 mg (  2pack) - sample provided  #[id number] H   12/21/2024 Toradol  60 mg IM in the right deltoid.  No complications. Since she has reported that Toradol  will usually help with the headaches, sometimes better than other medications, I will have her try indomethacin .  I asked her to limit to just 3 pills a week or less Stay active and exercise as tolerated. She will return for her Botox  injections.  Davina Howlett A. Vear, MD, [facility] 06/19/2024, 2:12 PM Certified in Neurology, Clinical Neurophysiology, Sleep Medicine and Neuroimaging  [facility] [address], [address] [place], [place] [address] [phone]     [1]  Allergies Allergen Reactions   Bee Venom Anaphylaxis and Rash    Other reaction(s): PRURITUS, RASH   Bioflavonoid Products Anaphylaxis and Itching    walnuts    Cocamide Anaphylaxis   Coconut (Cocos Nucifera) Anaphylaxis and Itching   Coconut Fatty Acid Anaphylaxis   Coconut Oil Anaphylaxis and Itching   Food Anaphylaxis and Itching    Walnuts - anaphylaxis, itching   Mixed Ragweed Anaphylaxis, Itching and Rash   Molds & Smuts Anaphylaxis, Itching and Shortness Of Breath    Other Reaction(s): Other (See Comments)   Mushroom Ext Cmplx(Shiitake-Reishi-Mait) Anaphylaxis   Other Anaphylaxis, Hives and Itching    Ragweed  Dog and Cat Dander  Ragweed    Other reaction(s): Wheezing (ALLERGY/intolerance) Dog and Cat Dander Ragweed   Shellfish Allergy Anaphylaxis and Swelling   Strawberry Extract Anaphylaxis   Ambrosia Artemisiifolia (Ragweed) Skin Test     Other Reaction(s): Other (See Comments)   Doxepin     Oxycodone  Itching    Other Reaction(s): Other (See Comments)  Other Reaction(s): Unknown   Hydrocodone -Acetaminophen  Rash and Itching    Other Reaction(s): Other (See Comments)   Latex Rash  [2]  Current Outpatient Medications:    albuterol  (PROVENTIL  HFA) 108 (90 Base) MCG/ACT inhaler, Inhale 2 puffs into the lungs every 4 (four) hours as needed for wheezing or shortness of breath., Disp: , Rfl:    ALPRAZolam  (XANAX ) 0.5 MG tablet, Take 1 tablet (0.5 mg total) by mouth 2 (two) times daily as needed for anxiety., Disp: 30 tablet, Rfl: 0   azelastine  (ASTELIN ) 0.1 % nasal spray, Place 2 sprays into both nostrils daily as needed for allergies. Use in each nostril as directed, Disp: , Rfl:    BIOTIN  PO, Take 1 capsule by mouth daily., Disp: , Rfl:    budesonide-formoterol  (SYMBICORT) 80-4.5 MCG/ACT inhaler, Inhale 2 puffs into the lungs as directed., Disp: , Rfl:    cetirizine (ZYRTEC) 10 MG tablet, Take 10 mg by mouth daily., Disp: , Rfl:    citalopram  (CELEXA ) 20 MG tablet, Take 20 mg by mouth daily., Disp: , Rfl:    CREON  36000-114000 units CPEP capsule, Take 36,000 Units by mouth as directed., Disp: , Rfl:    [employer] ACETAMINOPHEN  EX ST 500  MG tablet, Take 1,000 mg by mouth every 6 (six) hours as needed., Disp: , Rfl:    cyclobenzaprine  (FLEXERIL ) 10 MG tablet, Take 1 tablet (10 mg total) by mouth at bedtime., Disp: 30 tablet, Rfl: 3   diclofenac  Sodium (VOLTAREN ) 1 % GEL, Apply 4 g topically 4 (four) times daily., Disp: , Rfl:    diphenhydrAMINE  (SOMINEX) 25 MG tablet, Take 25 mg by mouth as needed for itching, allergies or sleep., Disp: , Rfl:    EPINEPHrine  (EPIPEN  2-PAK) 0.3 mg/0.3 mL IJ SOAJ injection, Inject 0.3 mg into the muscle as needed for anaphylaxis., Disp: , Rfl:  fluticasone  (FLONASE ) 50 MCG/ACT nasal spray, Place 1 spray into both nostrils as needed (allergies/runny nose)., Disp: 16 g, Rfl: 0   Galcanezumab -gnlm (EMGALITY ) 120 MG/ML SOAJ, INJECT 120 MG INTO THE SKIN EVERY 30 DAYS, Disp: 1 mL, Rfl: 1   indomethacin  (INDOCIN ) 25 MG capsule, One po prn migraine.  No more than 3/week, Disp: 15 capsule, Rfl: 2   lidocaine  (LIDODERM ) 5 %, Place 1 patch onto the skin daily as needed (pain)., Disp: , Rfl:    LINZESS  72 MCG capsule, Take 72 mcg by mouth daily before breakfast., Disp: , Rfl:    Melatonin 10 MG CHEW, Chew 30 mg by mouth at bedtime as needed (sleep)., Disp: , Rfl:    montelukast  (SINGULAIR ) 10 MG tablet, Take 10 mg by mouth at bedtime as needed (allergies)., Disp: , Rfl:    MOUNJARO 10 MG/0.5ML Pen, Inject 10 mg into the skin once a week., Disp: , Rfl:    olopatadine  (PATANOL) 0.1 % ophthalmic solution, Place 1 drop into both eyes daily at 6 (six) AM., Disp: , Rfl:    pantoprazole  (PROTONIX ) 20 MG tablet, Take 1 tablet (20 mg total) by mouth daily., Disp: 14 tablet, Rfl: 0   polyethylene glycol (MIRALAX ) 17 g packet, Take 17 g by mouth 2 (two) times daily., Disp: 60 each, Rfl: 0   potassium chloride  (KLOR-CON  M) 10 MEQ tablet, Take 10 mEq by mouth daily., Disp: , Rfl:    prazosin  (MINIPRESS ) 1 MG capsule, Take 1 mg by mouth at bedtime., Disp: , Rfl:    QUEtiapine  (SEROQUEL ) 25 MG tablet, Take 25 mg by mouth at  bedtime., Disp: , Rfl:    raNITIdine HCl (RANITIDINE 150 MAX STRENGTH PO), Take 1 tablet by mouth daily., Disp: , Rfl:    sodium phosphate  (FLEET) ENEM, Place 133 mLs (1 enema total) rectally daily as needed for severe constipation., Disp: 3 mL, Rfl: 0   tapentadol  (NUCYNTA ) 50 MG tablet, Take 50 mg by mouth as needed., Disp: , Rfl:    triamcinolone cream (KENALOG) 0.1 %, Apply 1 Application topically 2 (two) times daily., Disp: , Rfl:    TROLAMINE SALICYLATE EX, Apply 1 Application topically in the morning and at bedtime., Disp: , Rfl:    Zavegepant HCl (ZAVZPRET ) 10 MG/ACT SOLN, Place 1 spray into the nose daily as needed., Disp: 6 each, Rfl: 0   eszopiclone (LUNESTA) 1 MG TABS tablet, Take 1 mg by mouth at bedtime as needed. (Patient not taking: Reported on 06/19/2024), Disp: , Rfl:    hydrochlorothiazide  (HYDRODIURIL ) 12.5 MG tablet, Take 12.5 mg by mouth daily. (Patient not taking: Reported on 06/19/2024), Disp: , Rfl:   "

## 2024-06-19 ENCOUNTER — Ambulatory Visit: Admitting: Neurology

## 2024-06-19 ENCOUNTER — Telehealth: Payer: Self-pay | Admitting: Adult Health

## 2024-06-19 ENCOUNTER — Encounter: Payer: Self-pay | Admitting: Neurology

## 2024-06-19 VITALS — BP 108/73 | HR 96 | Resp 14 | Ht 66.0 in | Wt 154.0 lb

## 2024-06-19 DIAGNOSIS — F419 Anxiety disorder, unspecified: Secondary | ICD-10-CM | POA: Diagnosis not present

## 2024-06-19 DIAGNOSIS — G43711 Chronic migraine without aura, intractable, with status migrainosus: Secondary | ICD-10-CM | POA: Diagnosis not present

## 2024-06-19 DIAGNOSIS — R519 Headache, unspecified: Secondary | ICD-10-CM

## 2024-06-19 DIAGNOSIS — M542 Cervicalgia: Secondary | ICD-10-CM

## 2024-06-19 MED ORDER — INDOMETHACIN 25 MG PO CAPS: ORAL_CAPSULE | ORAL | 2 refills | Status: AC

## 2024-06-19 MED ORDER — KETOROLAC TROMETHAMINE 60 MG/2ML IM SOLN
60.0000 mg | Freq: Once | INTRAMUSCULAR | Status: AC
  Administered 2024-06-19: 60 mg via INTRAMUSCULAR

## 2024-06-19 NOTE — Telephone Encounter (Signed)
 Submitted auth request via CMM, status is pending. Key: [id number]

## 2024-06-20 NOTE — Telephone Encounter (Signed)
 Auth was approved, she will continue to fill through [facility].  Auth#: [id number] (06/19/24-09/17/24)

## 2024-07-24 ENCOUNTER — Ambulatory Visit: Admitting: Adult Health
